# Patient Record
Sex: Female | Born: 1965 | Race: White | Hispanic: No | Smoking: Never smoker
Health system: Southern US, Community
[De-identification: ages and names within clinical notes are randomized; demographics above are authoritative.]

## PROBLEM LIST (undated history)

## (undated) DIAGNOSIS — M1990 Unspecified osteoarthritis, unspecified site: Secondary | ICD-10-CM

## (undated) DIAGNOSIS — Z87442 Personal history of urinary calculi: Secondary | ICD-10-CM

## (undated) DIAGNOSIS — K589 Irritable bowel syndrome without diarrhea: Secondary | ICD-10-CM

## (undated) DIAGNOSIS — I8290 Acute embolism and thrombosis of unspecified vein: Secondary | ICD-10-CM

## (undated) DIAGNOSIS — F419 Anxiety disorder, unspecified: Secondary | ICD-10-CM

## (undated) DIAGNOSIS — K219 Gastro-esophageal reflux disease without esophagitis: Secondary | ICD-10-CM

## (undated) DIAGNOSIS — H409 Unspecified glaucoma: Secondary | ICD-10-CM

## (undated) DIAGNOSIS — R112 Nausea with vomiting, unspecified: Secondary | ICD-10-CM

## (undated) DIAGNOSIS — I10 Essential (primary) hypertension: Secondary | ICD-10-CM

## (undated) DIAGNOSIS — H3320 Serous retinal detachment, unspecified eye: Secondary | ICD-10-CM

## (undated) DIAGNOSIS — Z9889 Other specified postprocedural states: Secondary | ICD-10-CM

## (undated) DIAGNOSIS — I2699 Other pulmonary embolism without acute cor pulmonale: Secondary | ICD-10-CM

## (undated) DIAGNOSIS — T7840XA Allergy, unspecified, initial encounter: Secondary | ICD-10-CM

## (undated) DIAGNOSIS — I82409 Acute embolism and thrombosis of unspecified deep veins of unspecified lower extremity: Secondary | ICD-10-CM

## (undated) DIAGNOSIS — D649 Anemia, unspecified: Secondary | ICD-10-CM

## (undated) DIAGNOSIS — K602 Anal fissure, unspecified: Secondary | ICD-10-CM

## (undated) DIAGNOSIS — H7291 Unspecified perforation of tympanic membrane, right ear: Secondary | ICD-10-CM

## (undated) DIAGNOSIS — Z8489 Family history of other specified conditions: Secondary | ICD-10-CM

## (undated) DIAGNOSIS — R011 Cardiac murmur, unspecified: Secondary | ICD-10-CM

## (undated) DIAGNOSIS — Z8719 Personal history of other diseases of the digestive system: Secondary | ICD-10-CM

## (undated) HISTORY — DX: Anal fissure, unspecified: K60.2

## (undated) HISTORY — PX: INCONTINENCE SURGERY: SHX676

## (undated) HISTORY — DX: Essential (primary) hypertension: I10

## (undated) HISTORY — DX: Gastro-esophageal reflux disease without esophagitis: K21.9

## (undated) HISTORY — DX: Allergy, unspecified, initial encounter: T78.40XA

## (undated) HISTORY — DX: Personal history of other diseases of the digestive system: Z87.19

## (undated) HISTORY — PX: ENDOMETRIAL ABLATION: SHX621

## (undated) HISTORY — PX: ABDOMINAL HYSTERECTOMY: SHX81

## (undated) HISTORY — DX: Other pulmonary embolism without acute cor pulmonale: I26.99

## (undated) HISTORY — DX: Cardiac murmur, unspecified: R01.1

## (undated) HISTORY — DX: Unspecified osteoarthritis, unspecified site: M19.90

## (undated) HISTORY — DX: Acute embolism and thrombosis of unspecified vein: I82.90

## (undated) HISTORY — PX: EYE SURGERY: SHX253

## (undated) HISTORY — PX: TUBAL LIGATION: SHX77

## (undated) HISTORY — PX: COLONOSCOPY: SHX174

## (undated) HISTORY — PX: RETINAL DETACHMENT SURGERY: SHX105

## (undated) HISTORY — PX: KNEE SURGERY: SHX244

## (undated) HISTORY — DX: Anxiety disorder, unspecified: F41.9

## (undated) HISTORY — PX: RECTAL PROLAPSE REPAIR: SHX759

## (undated) HISTORY — DX: Serous retinal detachment, unspecified eye: H33.20

## (undated) HISTORY — PX: INNER EAR SURGERY: SHX679

## (undated) HISTORY — DX: Acute embolism and thrombosis of unspecified deep veins of unspecified lower extremity: I82.409

---

## 1998-06-19 HISTORY — PX: RETINAL DETACHMENT SURGERY: SHX105

## 1999-01-10 ENCOUNTER — Other Ambulatory Visit: Admission: RE | Admit: 1999-01-10 | Discharge: 1999-01-10 | Payer: Self-pay | Admitting: Obstetrics and Gynecology

## 1999-01-21 ENCOUNTER — Observation Stay (HOSPITAL_COMMUNITY): Admission: AD | Admit: 1999-01-21 | Discharge: 1999-01-22 | Payer: Self-pay | Admitting: Ophthalmology

## 1999-03-02 ENCOUNTER — Ambulatory Visit (HOSPITAL_COMMUNITY): Admission: RE | Admit: 1999-03-02 | Discharge: 1999-03-03 | Payer: Self-pay | Admitting: Ophthalmology

## 2000-05-22 ENCOUNTER — Encounter (INDEPENDENT_AMBULATORY_CARE_PROVIDER_SITE_OTHER): Payer: Self-pay | Admitting: Specialist

## 2000-05-22 ENCOUNTER — Other Ambulatory Visit: Admission: RE | Admit: 2000-05-22 | Discharge: 2000-05-22 | Payer: Self-pay | Admitting: Obstetrics and Gynecology

## 2000-08-17 ENCOUNTER — Other Ambulatory Visit: Admission: RE | Admit: 2000-08-17 | Discharge: 2000-08-17 | Payer: Self-pay | Admitting: Obstetrics and Gynecology

## 2000-09-10 ENCOUNTER — Encounter (INDEPENDENT_AMBULATORY_CARE_PROVIDER_SITE_OTHER): Payer: Self-pay | Admitting: Specialist

## 2000-09-10 ENCOUNTER — Ambulatory Visit (HOSPITAL_COMMUNITY): Admission: RE | Admit: 2000-09-10 | Discharge: 2000-09-10 | Payer: Self-pay | Admitting: Obstetrics and Gynecology

## 2001-10-23 ENCOUNTER — Other Ambulatory Visit: Admission: RE | Admit: 2001-10-23 | Discharge: 2001-10-23 | Payer: Self-pay | Admitting: Obstetrics and Gynecology

## 2002-12-26 ENCOUNTER — Other Ambulatory Visit: Admission: RE | Admit: 2002-12-26 | Discharge: 2002-12-26 | Payer: Self-pay | Admitting: Obstetrics and Gynecology

## 2003-09-18 ENCOUNTER — Inpatient Hospital Stay (HOSPITAL_COMMUNITY): Admission: AD | Admit: 2003-09-18 | Discharge: 2003-09-18 | Payer: Self-pay | Admitting: Obstetrics & Gynecology

## 2004-05-02 ENCOUNTER — Other Ambulatory Visit: Admission: RE | Admit: 2004-05-02 | Discharge: 2004-05-02 | Payer: Self-pay | Admitting: Obstetrics and Gynecology

## 2004-05-05 ENCOUNTER — Encounter: Admission: RE | Admit: 2004-05-05 | Discharge: 2004-05-05 | Payer: Self-pay | Admitting: Obstetrics and Gynecology

## 2004-05-25 ENCOUNTER — Encounter: Admission: RE | Admit: 2004-05-25 | Discharge: 2004-05-25 | Payer: Self-pay | Admitting: Obstetrics and Gynecology

## 2004-10-06 ENCOUNTER — Observation Stay (HOSPITAL_COMMUNITY): Admission: RE | Admit: 2004-10-06 | Discharge: 2004-10-07 | Payer: Self-pay | Admitting: Obstetrics and Gynecology

## 2004-10-11 ENCOUNTER — Inpatient Hospital Stay (HOSPITAL_COMMUNITY): Admission: AD | Admit: 2004-10-11 | Discharge: 2004-10-11 | Payer: Self-pay | Admitting: Obstetrics and Gynecology

## 2009-06-19 HISTORY — PX: HEMORRHOID SURGERY: SHX153

## 2009-10-05 ENCOUNTER — Ambulatory Visit (HOSPITAL_COMMUNITY): Admission: RE | Admit: 2009-10-05 | Discharge: 2009-10-05 | Payer: Self-pay | Admitting: General Surgery

## 2009-12-07 ENCOUNTER — Encounter: Admission: RE | Admit: 2009-12-07 | Discharge: 2009-12-07 | Payer: Self-pay | Admitting: Family Medicine

## 2010-02-09 ENCOUNTER — Encounter: Admission: RE | Admit: 2010-02-09 | Discharge: 2010-02-09 | Payer: Self-pay | Admitting: Family Medicine

## 2010-04-19 HISTORY — PX: VEIN SURGERY: SHX48

## 2010-08-18 ENCOUNTER — Other Ambulatory Visit: Payer: Self-pay | Admitting: Obstetrics and Gynecology

## 2010-08-18 DIAGNOSIS — R928 Other abnormal and inconclusive findings on diagnostic imaging of breast: Secondary | ICD-10-CM

## 2010-08-24 ENCOUNTER — Ambulatory Visit
Admission: RE | Admit: 2010-08-24 | Discharge: 2010-08-24 | Disposition: A | Discharge status: Home / Self Care | Payer: PRIVATE HEALTH INSURANCE | Source: Ambulatory Visit | Attending: Obstetrics and Gynecology | Admitting: Obstetrics and Gynecology

## 2010-08-24 DIAGNOSIS — R928 Other abnormal and inconclusive findings on diagnostic imaging of breast: Secondary | ICD-10-CM

## 2010-09-06 LAB — PREGNANCY, URINE: Preg Test, Ur: NEGATIVE

## 2010-09-06 LAB — DIFFERENTIAL
Basophils Absolute: 0 10*3/uL (ref 0.0–0.1)
Basophils Relative: 1 % (ref 0–1)
Eosinophils Absolute: 0.1 10*3/uL (ref 0.0–0.7)
Eosinophils Relative: 1 % (ref 0–5)
Lymphocytes Relative: 19 % (ref 12–46)
Lymphs Abs: 1.3 10*3/uL (ref 0.7–4.0)
Monocytes Absolute: 0.5 10*3/uL (ref 0.1–1.0)
Monocytes Relative: 8 % (ref 3–12)
Neutro Abs: 4.7 10*3/uL (ref 1.7–7.7)
Neutrophils Relative %: 71 % (ref 43–77)

## 2010-09-06 LAB — CBC
HCT: 40.8 % (ref 36.0–46.0)
Hemoglobin: 14.2 g/dL (ref 12.0–15.0)
MCHC: 34.7 g/dL (ref 30.0–36.0)
MCV: 89.8 fL (ref 78.0–100.0)
Platelets: 272 10*3/uL (ref 150–400)
RBC: 4.54 MIL/uL (ref 3.87–5.11)
RDW: 12.8 % (ref 11.5–15.5)
WBC: 6.6 10*3/uL (ref 4.0–10.5)

## 2010-10-10 ENCOUNTER — Encounter: Payer: Self-pay | Admitting: Internal Medicine

## 2010-10-11 ENCOUNTER — Encounter: Payer: Self-pay | Admitting: *Deleted

## 2010-10-11 ENCOUNTER — Encounter: Payer: Self-pay | Admitting: Internal Medicine

## 2010-10-11 ENCOUNTER — Ambulatory Visit (INDEPENDENT_AMBULATORY_CARE_PROVIDER_SITE_OTHER): Payer: PRIVATE HEALTH INSURANCE | Admitting: Internal Medicine

## 2010-10-11 DIAGNOSIS — R0609 Other forms of dyspnea: Secondary | ICD-10-CM

## 2010-10-11 DIAGNOSIS — I10 Essential (primary) hypertension: Secondary | ICD-10-CM

## 2010-10-11 DIAGNOSIS — R0600 Dyspnea, unspecified: Secondary | ICD-10-CM

## 2010-10-11 MED ORDER — NEBIVOLOL HCL 5 MG PO TABS: 5.0000 mg | ORAL_TABLET | Freq: Every day | ORAL | Status: DC

## 2010-10-11 MED ORDER — DEXLANSOPRAZOLE 60 MG PO CPDR: 60.0000 mg | DELAYED_RELEASE_CAPSULE | Freq: Every day | ORAL | Status: AC

## 2010-10-11 NOTE — Progress Notes (Signed)
  Subjective:    Patient ID: Sharon [name], female    DOB: 1965/11/16, 45 y.o.   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   10/11/2010 Initial pulmonary office eval on ACEI ccc  new onset for 2 years constant sob esp with talking assoc with chest tightness and a dry cough.  Rarely happening at night. Assoc with overt HB.  Neg w/u with chest ct, spirometry 4/17 classic vcd pattern.    Sob is the same at rest vs walking.  Mild nasal congestion chronically also but no purulent secretions, no response to anything for nose including inhaled steroids so stopped fu with Dr Ezzard Standing.  Nothing also ever helps breathing in terms of meds but wearing a bra or speaking makes it much worse  Pt denies any significant sore throat, dysphagia, itching, sneezing,  nasal congestion or excess/ purulent secretions,  fever, chills, sweats, unintended wt loss, pleuritic or exertional cp, hempoptysis, orthopnea pnd or leg swelling.    Also denies any obvious fluctuation of symptoms with weather or environmental changes or other aggravating or alleviating factors other than above.  PMHx CR  F/u [place]     Review of Systems  Constitutional: Negative for fever, chills and unexpected weight change.  HENT: Positive for congestion. Negative for ear pain, nosebleeds, sore throat, rhinorrhea, sneezing, trouble swallowing, dental problem, voice change, postnasal drip and sinus pressure.   Eyes: Negative for visual disturbance.  Respiratory: Positive for cough and shortness of breath. Negative for choking.   Cardiovascular: Negative for chest pain and leg swelling.  Gastrointestinal: Negative for vomiting, abdominal pain and diarrhea.  Genitourinary: Negative for difficulty urinating.  Musculoskeletal: Positive for arthralgias.  Skin: Negative for rash.    Neurological: Negative for tremors, syncope and headaches.  Hematological: Does not bruise/bleed easily.       Objective:   Physical Exam Wt 172  10/11/10    anxious wf with classic voice fatigue and classic pseudowheeze .    HEENT: nl dentition, turbinates, and orophanx. Nl external ear canals without cough reflex   NECK :  without JVD/Nodes/TM/ nl carotid upstrokes bilaterally   LUNGS: no acc muscle use, clear to A and P bilaterally without cough on insp or exp maneuvers   CV:  RRR  no s3 or murmur or increase in P2, no edema   ABD:  soft and nontender with nl excursion in the supine position. No bruits or organomegaly, bowel sounds nl  MS:  warm without deformities, calf tenderness, cyanosis or clubbing  SKIN: warm and dry without lesions    NEURO:  alert, approp, no deficits      Assessment & Plan:

## 2010-10-11 NOTE — Patient Instructions (Addendum)
Stop lisinopril Start bystolic 5 mg one daily Start dexilant 60 mg Take 30-60 min before first meal of the day and Zantac 300mg  one at bedtime   GERD (REFLUX)  is an extremely common cause of respiratory symptoms like yours,  many times with no significant heartburn at all.    It can be treated with medication, but also with lifestyle changes including avoidance of late meals, excessive alcohol, smoking cessation, and avoid fatty foods, chocolate, peppermint, colas, red wine, and acidic juices such as orange juice.  NO MINT OR MENTHOL PRODUCTS SO NO COUGH DROPS  USE SUGARLESS CANDY INSTEAD (jolley ranchers or Stover's)  NO OIL BASED VITAMINS    Please schedule a follow up office visit in 2 weeks, sooner if needed

## 2010-10-13 DIAGNOSIS — I10 Essential (primary) hypertension: Secondary | ICD-10-CM | POA: Insufficient documentation

## 2010-10-13 DIAGNOSIS — R0600 Dyspnea, unspecified: Secondary | ICD-10-CM | POA: Insufficient documentation

## 2010-10-13 NOTE — Assessment & Plan Note (Addendum)
The differential diagnosis of difficult to control airways disorders is extensive with no quick and easy answers but easy to remember because it consists of 11 A's,  Two Bs and one C: 1. Adherence, always a challenge and the leading suspect.   2. Acid reflux disease, with the greater proportion of pulmonary patients with no overt heartburn symptoms, and no easy way to treat non-acid reflux  > start empiric high dose rx/ diet   3. Ace inhibitor use, the side effects of which give  even experienced pulmonologists a challenge sorting out (it's not all about the dry cough, though if cough is present ace's will need to be stopped for at least a month to tease out this component)  4. Active sinus dz, best addressed by a sinus ct >  May need to return to Dr Narda Bonds to also look at cords   5. Active smoking,  Usually sureptitious in this setting >  >not relevant here   6. Allergic diseases, usually with a hx dating back to childhood with prominent allergic rhinitis features in up 90% of pts   7. Aspiration, a perennial problem in the elderly or other patients at risk> >not relevant here   8. Allergic Bronchopulmonary Aspergillosis, associated with IgE's in the thousands > >not relevant here  9. Alpha one Antitrypsin deficiency, a must screen in patients with chronic airflow obstruction syndromes out of proportion to smoking history  > >not relevant here   10. Adverse effect of inhalers, especially DPI's and especially with poor inhaler technique > >not relevant here   11 Anxiety, always a diagnosis of exclusion and probably a component  12. Actual health care provider (works in [place]) Two B's 1. Bronchiectasis:  Pos CT is the sine que non here 2  Beta blocker effects:  Coreg and Timolol use are pervasive in the adult population and both have significant spillover effects on the airways One C 1. Congestive heart failure, nicely ruled out now with BNP level of < 100 when symptomatic   See  instructions for specific recommendations which were reviewed directly with the patient who was given a copy with highlighter outlining the key components.

## 2010-10-13 NOTE — Assessment & Plan Note (Signed)

## 2010-10-18 ENCOUNTER — Telehealth: Payer: Self-pay | Admitting: *Deleted

## 2010-10-18 NOTE — Telephone Encounter (Signed)
done

## 2010-10-18 NOTE — Telephone Encounter (Signed)
PA form received and placed in MW look at for signature and review.

## 2010-10-18 NOTE — Telephone Encounter (Signed)
PA for Dexilant initiated through [facility] at [phone]. Member # [id number]. Awaiting fax.

## 2010-10-20 NOTE — Telephone Encounter (Signed)
Dexilant has been APPROVED from 10/20/2010 to 10/19/2012. Pt and [facility] in [place] notified.

## 2010-10-20 NOTE — Telephone Encounter (Signed)
PA was faxed back to ins co this am

## 2010-10-25 ENCOUNTER — Ambulatory Visit (INDEPENDENT_AMBULATORY_CARE_PROVIDER_SITE_OTHER)
Admission: RE | Admit: 2010-10-25 | Discharge: 2010-10-25 | Disposition: A | Discharge status: Home / Self Care | Payer: PRIVATE HEALTH INSURANCE | Source: Ambulatory Visit | Attending: Cardiology | Admitting: Cardiology

## 2010-10-25 ENCOUNTER — Encounter: Payer: Self-pay | Admitting: Internal Medicine

## 2010-10-25 ENCOUNTER — Ambulatory Visit (INDEPENDENT_AMBULATORY_CARE_PROVIDER_SITE_OTHER): Payer: PRIVATE HEALTH INSURANCE | Admitting: Internal Medicine

## 2010-10-25 VITALS — BP 122/74 | HR 70 | Temp 98.5°F | Ht 68.0 in | Wt 170.8 lb

## 2010-10-25 DIAGNOSIS — R051 Acute cough: Secondary | ICD-10-CM | POA: Insufficient documentation

## 2010-10-25 DIAGNOSIS — R05 Cough: Secondary | ICD-10-CM | POA: Insufficient documentation

## 2010-10-25 DIAGNOSIS — I10 Essential (primary) hypertension: Secondary | ICD-10-CM

## 2010-10-25 DIAGNOSIS — R059 Cough, unspecified: Secondary | ICD-10-CM

## 2010-10-25 MED ORDER — PREDNISONE (PAK) 10 MG PO TABS: ORAL_TABLET | ORAL | Status: AC

## 2010-10-25 MED ORDER — TRAMADOL HCL 50 MG PO TABS: ORAL_TABLET | ORAL | Status: DC

## 2010-10-25 NOTE — Progress Notes (Signed)
  Subjective:    Patient ID: Sharon [name], female    DOB: 06-19-1966, 45 y.o.   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   10/11/2010 Initial pulmonary office eval on ACEI ccc  new onset for 2 years constant sob esp with talking assoc with chest tightness and a dry cough.  Rarely happening at night. Assoc with overt HB.  Neg w/u with chest ct, spirometry 4/17 classic vcd pattern.    Sob is the same at rest vs walking.  Mild nasal congestion chronically also but no purulent secretions, no response to anything for nose including inhaled steroids so stopped fu with Dr Ezzard Standing.  Nothing also ever helps breathing in terms of meds but wearing a bra or speaking makes it much worse.  rec Stop lisinopril Start bystolic 5 mg one daily Start dexilant 60 mg Take 30-60 min before first meal of the day and Zantac 300mg  one at bedtime GERD (REFLUX) diet  10/25/2010 ov/ Dilon Lank  Cc no longer with noct respiratory events,  Trying to weaning off afrin x 4 days stopped and has noticed increase throat scratchiness since then.  Taking one half at clonazepam at bedtime and not sleeping well. Pt denies any significant sore throat, dysphagia, itching, sneezing,  nasal congestion or excess/ purulent secretions,  fever, chills, sweats, unintended wt loss, pleuritic or exertional cp, hempoptysis, orthopnea pnd or leg swelling.    Also denies any obvious fluctuation of symptoms with weather or environmental changes or other aggravating or alleviating factors.     PMHx CR  F/u [place]  HBP  Off ACEI 10/11/10 due to pseudowheeze   Review of Systems     Objective:   Physical Exam    Wt 172 10/11/10  > 170 10/26/2010  anxious wf with classic voice fatigue and classic pseudowheeze not as severe .  HEENT: nl dentition, turbinates, and orophanx. Nl external ear  canals without cough reflex  NECK : without JVD/Nodes/TM/ nl carotid upstrokes bilaterally  LUNGS: no acc muscle use, clear to A and P bilaterally without cough on insp or exp maneuvers  CV: RRR no s3 or murmur or increase in P2, no edema  ABD: soft and nontender with nl excursion in the supine position. No bruits or organomegaly, bowel sounds nl  MS: warm without deformities, calf tenderness, cyanosis or clubbing  SKIN: warm and dry without lesions      Assessment & Plan:

## 2010-10-25 NOTE — Patient Instructions (Addendum)
Continue  bystolic 5 mg one daily Continue  dexilant 60 mg Take 30-60 min before first meal of the day and Zantac 300mg  one at bedtime Add Chlortrimeton 4mg  one bedtime and during the day as needed for sensation of drainage.   GERD (REFLUX)  is an extremely common cause of respiratory symptoms like yours,  many times with no significant heartburn at all.    It can be treated with medication, but also with lifestyle changes including avoidance of late meals, excessive alcohol, smoking cessation, and avoid fatty foods, chocolate, peppermint, colas, red wine, and acidic juices such as orange juice.  NO MINT OR MENTHOL PRODUCTS SO NO COUGH DROPS  USE SUGARLESS CANDY INSTEAD (jolley ranchers or Stover's)  NO OIL BASED VITAMINS    Take delsym two tsp every 12 hours and supplement if needed with  tramadol 50 mg up to 2 every 4 hours to suppress the urge to cough. Swallowing water or using ice chips/non mint and menthol containing candies (such as lifesavers or sugarless jolly ranchers) are also effective.  You should rest your voice and avoid activities that you know make you cough.  Once you have eliminated the cough for 3 straight days try reducing the tramadol first,  then the delsym as tolerated.    Prednisone 10 mg take  4 each am x 2 days,   2 each am x 2 days,  1 each am x2days and stop .  Please see patient coordinator before you leave today  to schedule sinus CT   Please schedule a follow up office visit in 2 weeks, sooner if needed

## 2010-10-25 NOTE — Progress Notes (Signed)
Quick Note:  Spoke with pt and notified of results per Dr. Wert. Pt verbalized understanding and denied any questions.  ______ 

## 2010-10-26 NOTE — Assessment & Plan Note (Signed)
This has all the features of  Classic Upper airway cough syndrome, so named because it's frequently impossible to sort out how much is  CR/sinusitis with freq throat clearing (which can be related to primary GERD)   vs  causing  secondary (" extra esophageal")  GERD from wide swings in gastric pressure that occur with throat clearing, often  promoting self use of mint and menthol lozenges that reduce the lower esophageal sphincter tone and exacerbate the problem further in a cyclical fashion.   These are the same pts who not infrequently have failed to tolerate ace inhibitors,  dry powder inhalers or biphosphonates or report having reflux symptoms that don't respond to standard doses of PPI , and are easily confused as having aecopd or asthma flares,  rec continue off ace, on max gerd and rx cyclical cough  See instructions for specific recommendations which were reviewed directly with the patient who was given a copy with highlighter outlining the key components.

## 2010-10-26 NOTE — Assessment & Plan Note (Addendum)
Ok off ace : Strongly prefer in this setting: Bystolic, the most beta -1  selective Beta blocker available in sample form, with bisoprolol the most selective generic choice  on the market.

## 2010-10-28 ENCOUNTER — Encounter: Payer: Self-pay | Admitting: Internal Medicine

## 2010-11-01 ENCOUNTER — Telehealth: Payer: Self-pay | Admitting: Internal Medicine

## 2010-11-01 NOTE — Telephone Encounter (Signed)
Pt c/o "lump in throat" after she takes Delsym, hard for her to swallow approx 7-9 hours. States breathing and cough is better. Leaving for the beach tonight and wants to know if there is an alternative medication. Dr. Sherene Sires please advise. Thanks.

## 2010-11-01 NOTE — Telephone Encounter (Signed)
I rec tramadol to back up the delsym so it's ok to use tramadol in a pinch but could also try robitissuin (not rob dm) as needed, very mild but a reasonable otc choice.

## 2010-11-01 NOTE — Telephone Encounter (Signed)
Pt aware of recs. Rosselyn Martha, CMA  

## 2010-11-03 ENCOUNTER — Encounter: Payer: Self-pay | Admitting: Adult Health

## 2010-11-04 NOTE — Op Note (Signed)
[facility]  Patient:    Sharon [name]                     MRN: [id number] Proc. Date: 09/10/00 Adm. Date:  [id number] Attending:  Cordelia Pen Ii                           Operative Report  PREOPERATIVE DIAGNOSIS:       Pelvic pain.  Nevus of the left breast.  POSTOPERATIVE DIAGNOSIS:      Endometriosis.  Pelvic adhesions.  Nevus of the left breast.  OPERATION:                    Laparoscopy with resection and ablation of endometriosis, lysis of adhesions, and excision of mole from the left breast.  SURGEON:                      Guy Sandifer. Arleta Creek, M.D.  ASSISTANT:  ANESTHESIA:                   General with endotracheal intubation by Belva Agee, M.D.  ESTIMATED BLOOD LOSS:         Drops.  INDICATIONS:                  This patient is a 45 year old married white female, gravida 6, para 1, abortus 4, who has right lower quadrant pain and right lower back pain.  Details are dictated in the history and physical. Laparoscopy is discussed with the patient.  The patient also has a nevus on the left breast she requests removal of.  Possible risks and complications have been discussed including, but not limited to infection, scarring, bowel, bladder, or ureteral damage, bleeding requiring transfusion of blood products with possible transfusion reaction, HIV, and hepatitis acquisition, DVT, PE, pneumonia.  All questions were answered and consent is signed on the chart.  FINDINGS:                     The cecum is adherent to the right pelvic brim with multiple adhesions.  The appendix is normal.  The uterus is normal in size and contour.  Anterior cul-de-sac is normal.  Tubes and ovaries are normal bilaterally.  Left pelvic side wall normal.  Right pelvic side wall contains several petechial-type implants of endometriosis.  Posterior cul-de-sac contains an 8 mm dark black implant of endometriosis in the midline. There is a  pseudowindow in the upper midline cul-de-sac with red/brown endometriotic lesions.  DESCRIPTION OF PROCEDURE:     The patient is taken to the operating room and placed in the dorsal supine position where general anesthesia is induced via endotracheal intubation.  She is then prepped abdominally and vaginally.  The bladder is straight catheterized.  Hulka tenaculum is placed in the uterus as a manipulator and she is draped as a sterile fashion.  A small infraumbilical incision is made and a 10 mm disposable trocar sleeve was placed on the first attempt without difficulty.  Placement is verified with the laparoscope and no damage to the surrounding structures is noted.  The pneumoperitoneum is induced. A small suprapubic incision is made and a 5 mm nondisposable trocar sleeve is placed under direct visualization without difficulty.  The above findings are noted.  The dark black implant in the posterior cul-de-sac is resected in a simple fashion without difficulty.  Hemostasis is maintained with bipolar cautery at the peritoneal margins of the incision.  The implants in the pseudowindow are ablated with bipolar cautery.  A similar procedure is carried out on the right pelvic side wall.  This is well clear of the course of the ureter.  The adhesions to the right pelvic brim are taken down sharply and hemostasis is obtained with bipolar cautery.  Copious irrigation is carried out and all returns as clear.  Interceed is backloaded through the laparoscope and placed over the posterior cul-de-sac, right pelvic side wall, and the cecum.  Excess fluid is removed.  Suprapubic trocar sleeve is removed and the peritoneum is reduced and no bleeding is noted.  The umbilical trocar sleeve is removed and the skin incisions are closed with subcuticular 3-0 Vicryl suture.  The incisions are injected with 0.5% plain Marcaine.  The 1 cm nevus on a very narrow base in the 3 oclock position 3 cm lateral to the  left areola of the left breast is draped and prepped with Betadine.  Simple resection is used to remove the nevus.  Pressure is applied and then Dermabond is applied.  This is also injected with 0.5% plain Marcaine.  Hulka tenaculum is removed and no bleeding is noted.  All counts are correct and the patient is awakened, taken to the recovery room in stable condition. DD:  09/10/00 TD:  09/10/00 Job: [id number]

## 2010-11-04 NOTE — Op Note (Signed)
NAMEJulienne [name]              ACCOUNT NO.:  [id number]   MEDICAL RECORD NO.:  [id number]          PATIENT TYPE:  AMB   LOCATION:  [facility]                           FACILITY:  [facility]   PHYSICIAN:  Guy Sandifer. Tomblin II, M.D.DATE OF BIRTH:  [birth year]   DATE OF PROCEDURE:  10/06/2004  DATE OF DISCHARGE:                                 OPERATIVE REPORT   PREOPERATIVE DIAGNOSES:  1.  Stress urinary incontinence.  2.  Desires permanent sterilization.  3.  Dysmenorrhea.   POSTOPERATIVE DIAGNOSES:  1.  Stress urinary incontinence.  2.  Endometriosis.  3.  Abdominopelvic adhesions.   PROCEDURES:  1.  Mid urethral sling with Uretex.  2.  Laparoscopy with bilateral tubal ligation with Filshie clips.  3.  Ablation of endometriosis.  4.  Lysis of adhesions.   SURGEON:  Harold Hedge, M.D.   ANESTHESIA:  General with endotracheal intubation.   ESTIMATED BLOOD LOSS:  50 mL.   INDICATIONS AND CONSENT:  This patient is a 45 year old, separated white  female, G6, P1, with increasing symptoms of stress urinary incontinence,  dysmenorrhea, dyspareunia, who also desires permanent sterilization.  Mid  urethral sling with Uretex, laparoscopy, tubal ligation with Filshie clips  has been discussed with the patient.  Alternate methods of contraception  have been discussed.  Potential risks and complications have been discussed  preoperatively, including but not limited to infection; bowel, bladder,  ureteral damage; bleeding requiring transfusion of blood products with  possible transfusion reaction, HIV, hepatitis acquisition; DVT, PE,  pneumonia; urinary retention; inability to void; possibility of self-  catheterization; possibility of persistent or recurrent stress incontinence;  vaginal mucosa erosion and dyspareunia and possibility of having the sling  taken back down later.  All questions are answered, and consent is signed  and on the chart.   FINDINGS:  Upper abdomen is grossly normal.   The appendix is free, but the  proximal large bowel is adherent to the abdominal sidewall.  In the pelvis,  the uterus is normal in size and contour.  Tubes and ovaries are normal  bilaterally.  Right pelvic sidewall contains some red, smudgy implants of  endometriosis, and the posterior cul-de-sac contains some pseudowindows with  several small implants of both powder-burn and white, pucker-type  endometrial implants.   DESCRIPTION OF PROCEDURE:  The patient is taken to the operating room where  she is identified, placed in dorsal supine position, and general anesthesia  is induced via endotracheal intubation.  She does have PAS stockings in  place.  She does receive preoperative antibiotics.  She is then placed in  the dorsolithotomy position where she is prepped abdominally and vaginally,  and she is draped in a sterile fashion.  Weighted speculum is placed.  The  suburethral vaginal mucosa is then injected with 0.5% Marcaine with  1:200,000 epinephrine.  Injection is carried out widely to the inferior  margin of the pelvic brim as well.  A Foley catheter is placed in the  bladder to drain it and to mark the portion of the urethra.  The suburethral  vaginal mucosa is then taken  down in the midline and dissected bilaterally  to the point of the pelvic brim.  Suprapubically the midline is marked and  then injected, and two incisions are made, 1-1.5 fingerbreadths lateral to  the midline.  Then with the Foley catheter in place and the bladder  decompressed, the Bard Uretex needle is then placed per instruction, exiting  lateral to the urethra on the patient's left.  Cystoscopy is then carried  out, and 180-degree inspection reveals the bladder to be intact.  The  introducer is then pulled on through, leaving the blue tubing in place.  Similar procedure is carried out on the right side and, again, cystoscopy is  performed.  A 180-degree inspection reveals no evidence of bladder trauma or   perforation.  The ureteral orifices are identified bilaterally, and a puff  of urine is noted bilaterally.  No foreign bodies are noted.  The Uretex  sling is then placed using the blue tubing.  Tension is adjusted so there is  a small gap between the sling, and the urethra and a Kelly clamp placed  under the sling can easily be rotated 90 degrees to the floor without  tension.  The excess Uretex is then trimmed at the level of the skin.  The  vaginal mucosa is closed with a running locking 2-0 Vicryl suture.  A Foley  catheter is in place and draining to a bag.  Next, attention is turned to  the abdomen.  A small infraumbilical incision is made, and the Veress needle  is placed with a normal syringe drop test.  Two liters of gas are then  insufflated with low pressure with good tympany in the right upper quadrant.  Veress needle is removed and replaced with a 10-11 disposable trocar sleeve.  Placement is verified with the laparoscope, and no damage to surrounding  structures is noted.  Small suprapubic incision is made, and the 5 mm trocar  sleeve is placed under direct visualization without difficulty.  The above  findings are noted.  Cautery is used to obtain hemostasis at the point of  insertion of the adhesions to the right abdominal sidewall.  These adhesions  on the large bowel are then taken down well clear of the bowel.  Irrigation  is carried out, and inspection under reduced peritoneum reveals good  hemostasis.  The areas of endometriosis in the pelvis are ablated with  bipolar cautery, staying well clear of the course of the right ureter.  The  right fallopian tube is then identified from cornu to fimbria, and a Filshie  clip is placed on the proximal 1/3 of the tube.  A similar procedure is  carried out on the left tube.  After the Filshie clip applicator is removed,  careful inspection reveals the full width of the tube to be within the clip and the heel of the clip to be  visible through the mesosalpinx bilaterally.  Inspection again under reduced pneumoperitoneum reveals continued  hemostasis.  Pneumoperitoneum is completely reduced, and the trocar sleeves  are removed.  Then 2-0 Vicryl in subcuticular fashion is used to close the  umbilical incision.  Both incisions are injected with 0.5% plain Marcaine.  All 4 incisions are then closed at the skin with Dermabond.  All counts are  correct.  The patient is awakened and taken to the recovery room in stable  condition.      JET/MEDQ  D:  10/06/2004  T:  10/06/2004  Job:  [id number]

## 2010-11-04 NOTE — H&P (Signed)
[facility]  Patient:    Sharon [name]                       MRN: [id number] Adm. Date:  09/10/00 Attending:  Guy Sandifer. Ar[place]ta [place], M.D.                         History and Physical  CHIEF COMPLAINT:              Pelvic pain.  HISTORY OF PRESENT ILLNESS:   This patient is a 45 year old married white female, G6, P5, who has right lower quadrant pain and right lower back pain premenstrually each month.  The pain gets severe before each menses; however, she does have some dull right lower quadrant pain all month long.  After a careful discussion of the options, she is being admitted for a laparoscopy.  PAST MEDICAL HISTORY:         1. Irritable bowel syndrome.                               2. Vulvar herpes.                               3. Phlebitis in the left leg postpartum in 1991.                               4. Bilateral detached retinas.  PAST SURGICAL HISTORY:        1. Repair of bilateral retinal detachment.                               2. Ear drum reconstruction.                               3. Lump removed from leg at age 66.                               4. Hemorrhoidectomy x 2.  OBSTETRICAL HISTORY:          Termination x 1.  Miscarriage x 4.  Full-term delivery x 1 in 1990.  FAMILY HISTORY:               Diabetes in father and maternal grandmother, chronic hypertension in father, heart disease in father, emphysema in mother.  MEDICATIONS:                  1. Vicodin p.r.n.                               2. Acyclovir 400 mg 1 p.o. q.d.  ALLERGIES:                    PENICILLIN with unknown type of reaction.  SOCIAL HISTORY:               The patient denies tobacco, alcohol, or drug abuse.  REVIEW OF SYSTEMS:            Negative except as above.  PHYSICAL EXAMINATION:  VITAL SIGNS:  Height 5 feet 8-1/2 inches.  Weight 161 pounds. Blood pressure 120/78.  NECK:                         Without  thyromegaly.  LUNGS:                        Clear to auscultation.  HEART:                        Regular rate and rhythm.  BACK:                         Without CVA tenderness.  BREASTS:                      Without mass, retraction, or discharge.  ABDOMEN:                      Soft, nontender without masses.  PELVIC:                       Vulva, vagina, and cervix without lesion. Uterus anteverted, mobile, nontender.  Adnexa mildly tender on the right without masses.  Left adnexa mildly tender without masses.  EXTREMITIES:                  Grossly within normal limits.  NEUROLOGIC:                   Grossly within normal limits.  LABORATORY DATA:              Capillary hemoglobin 13.7.  Prothrombin mutation is negative.  Factor V Leiden mutation is negative.  Lupus anticoagulant negative.  Cardiolipin antibody IgM is inconclusive.  Cardiolipin antibody IgG is negative.  ANA is negative.  PT/PTT normal.  Ultrasound on September 04, 2000, reveals uterus measuring 7.7 x 4.26 x 5.59 cm. Ovaries are normal.  Sonohistogram is negative for endometrial masses.  ASSESSMENT:                   Pelvic pain and dysmenorrhea.  PLAN:                         Laparoscopy. DD:  09/07/00 TD:  09/07/00 Job: [id number]

## 2010-11-04 NOTE — H&P (Signed)
NAMEJulienne [name]              ACCOUNT NO.:  [id number]   MEDICAL RECORD NO.:  [id number]          PATIENT TYPE:  AMB   LOCATION:  [facility]                           FACILITY:  [facility]   PHYSICIAN:  Guy Sandifer. Tomblin II, M.D.DATE OF BIRTH:  Sep 01, 1965   DATE OF ADMISSION:  10/06/2004  DATE OF DISCHARGE:                                HISTORY & PHYSICAL   CHIEF COMPLAINT:  Leaking urine and desires permanent sterilization.   HISTORY OF PRESENT ILLNESS:  This patient is a 45 year old separated white  female, G6, P1 who leaks urine with any cough, sneeze, bearing down etc. She  has to wear a pad all of the time. It is becoming increasingly difficult for  her. Screening urodynamics in the office was negative for any evidence of  detrusor spasm. After careful discussion of the options, success and failure  rates and complications, the patient is being admitted for a Urotex  suburethral sling. She also requests permanent sterilization. The permanence  of the procedure as well as options of contraception have been reviewed  preoperatively. The potential risks and complications of the above surgery  have also been reviewed. Finally, she does have a history of endometriosis  and has noticed increased pain and low back pain before her menses over the  last several months.   PAST MEDICAL HISTORY:  1.  Endometriosis.  2.  Irritable bowel syndrome.  3.  Vulvar herpes.  4.  Phlebitis in her left leg postpartum in 1991.  5.  Bilateral retinal detachment.   PAST SURGICAL HISTORY:  1.  Laparoscopy in 2000.  2.  Repair of bilateral retinal detachment.  3.  Eardrum reconstruction.  4.  Lump removed from left leg at age 43.  5.  Hemorrhoidectomy x2.   OBSTETRIC HISTORY:  Termination x1. Miscarriage x4, fullterm delivery x1.   FAMILY HISTORY:  Diabetes in father and maternal grandmother, chronic  hypertension in father, heart disease in father, emphysema in mother.   SOCIAL HISTORY:  Denies,  tobacco, alcohol or drug abuse.   MEDICATIONS:  Tylenol p.r.n., Aciclovir daily.   ALLERGIES:  PENICILLIN leading to itching and hives as a child.   REVIEW OF SYMPTOMS:  NEUROLOGIC:  Denies headache. CARDIO:  Denies chest  pain. PULMONARY:  Denies shortness of breath. GI:  History of IBS as above.  EXTREMITIES:  History of phlebitis, left lower extremity.   PHYSICAL EXAMINATION:  VITAL SIGNS:  Height 5 feet 9 1/2 inches, weight 174  pounds, blood pressure 118/76.  HEENT:  Without thyromegaly.  LUNGS:  Clear to auscultation.  HEART:  Regular rate and rhythm.  BACK:  Without CVA tenderness.  BREASTS:  Without mass, traction or discharge.  ABDOMEN:  Soft, nontender without masses.  PELVIC:  Vulva, vagina and cervix without lesions. Uterus is upper limits of  normal size, mobile. First degree cystocele noted. There is hypermobility at  the urethrovesical angle. Adnexa nontender without masses.  EXTREMITIES:  Some superficial varicosities are noted on the left lower  extremity. No erythema or induration.   ASSESSMENT:  1.  Stress urinary incontinence.  2.  Desires  permanent sterilization.   PLAN:  Urotex mid urethral sling and laparoscopy with tubal ligation.      JET/MEDQ  D:  10/03/2004  T:  10/03/2004  Job:  [id number]

## 2010-11-08 ENCOUNTER — Encounter: Payer: Self-pay | Admitting: Adult Health

## 2010-11-08 ENCOUNTER — Ambulatory Visit (INDEPENDENT_AMBULATORY_CARE_PROVIDER_SITE_OTHER): Payer: PRIVATE HEALTH INSURANCE | Admitting: Adult Health

## 2010-11-08 VITALS — BP 120/70 | HR 69 | Temp 98.9°F | Ht 68.0 in | Wt 174.0 lb

## 2010-11-08 DIAGNOSIS — R05 Cough: Secondary | ICD-10-CM

## 2010-11-08 MED ORDER — TRAMADOL HCL 50 MG PO TABS: ORAL_TABLET | ORAL | Status: DC

## 2010-11-08 NOTE — Patient Instructions (Addendum)
May use Saline nasal rinses As needed   Saline gel At bedtime   Add Claritin 10mg  At bedtime  As needed  Drainage.  Continue on current meds.  follow up Dr. Sherene Sires  In 6 weeks and As needed

## 2010-11-08 NOTE — Assessment & Plan Note (Signed)
Multifactoral cough suspected secondary to ACE , reflux and rhinitis  Plan :  Cont on current rx  Add claritin As needed   Cough control precautions reinforced.  Saline nasal rinses  follow up Dr. Sherene Sires  In 6 weeks

## 2010-11-08 NOTE — Progress Notes (Signed)
Subjective:    Patient ID: Sharon [name], female    DOB: 1965-07-01, 45 y.o.   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  10/11/2010 Initial pulmonary office eval on ACEI ccc  new onset for 2 years constant sob esp with talking assoc with chest tightness and a dry cough.  Rarely happening at night. Assoc with overt HB.  Neg w/u with chest ct, spirometry 4/17 classic vcd pattern.    Sob is the same at rest vs walking.  Mild nasal congestion chronically also but no purulent secretions, no response to anything for nose including inhaled steroids so stopped fu with Dr Ezzard Standing.  Nothing also ever helps breathing in terms of meds but wearing a bra or speaking makes it much worse.  rec Stop lisinopril Start bystolic 5 mg one daily Start dexilant 60 mg Take 30-60 min before first meal of the day and Zantac 300mg  one at bedtime GERD (REFLUX) diet  10/25/2010 ov/ Wert  Cc no longer with noct respiratory events,  Trying to weaning off afrin x 4 days stopped and has noticed increase throat scratchiness since then.  Taking one half at clonazepam at bedtime and not sleeping well. >>tx w/ steroid taper, CT sinus neg  11/08/10 Follow up  Pt returns for follow up of cough. She is feeling much better. Cough is almost totally gone. Breathing is better.  Tramadol is helping to control cough. Does have drainage in throat. CT sinus last ov was neg for sinusitis.  Drainage worse at night. Nasal dryness in am. Could not tolerate delsym or chlor tabs     Review of Systems Constitutional:   No  weight loss, night sweats,  Fevers, chills, fatigue, or  lassitude.  HEENT:   No headaches,  Difficulty swallowing,  Tooth/dental problems, or  Sore throat,                No sneezing, itching, ear ache, nasal congestion, + post nasal drip,   CV:  No chest pain,   Orthopnea, PND, swelling in lower extremities, anasarca, dizziness, palpitations, syncope.   GI  No heartburn, indigestion, abdominal pain, nausea, vomiting, diarrhea, change in bowel habits, loss of appetite, bloody stools.   Resp: No shortness of breath with exertion or at rest.  No excess mucus, no productive cough,  No non-productive cough,  No coughing up of blood.  No change in color of mucus.  No wheezing.  No chest wall deformity  Skin: no rash or lesions.  GU: no dysuria, change in color of urine, no urgency or frequency.  No flank pain, no hematuria   MS:  No joint pain or swelling.  No decreased range of motion.  Marland Kitchen  Psych:  No change in mood or affect. No depression or anxiety.   .         Objective:   Physical Exam GEN: A/Ox3; pleasant , NAD, well nourished   HEENT:  [place]/AT,  EACs-clear, TMs-wnl, NOSE-clear, THROAT-clear, no lesions, no postnasal drip or exudate noted.   NECK:  Supple w/ fair ROM; no JVD; normal carotid impulses w/o bruits; no thyromegaly or nodules palpated; no lymphadenopathy.  RESP  Clear  P & A; w/o, wheezes/ rales/ or rhonchi.no accessory muscle use, no dullness to percussion  CARD:  RRR, no m/r/g  , no peripheral edema, pulses intact,  no cyanosis or clubbing.  GI:   Soft & nt; nml bowel sounds; no organomegaly or masses detected.  Musco: Warm bil, no deformities or joint swelling noted.   Neuro: alert, no focal deficits noted.    Skin: Warm, no lesions or rashes         Assessment & Plan:

## 2010-12-22 ENCOUNTER — Ambulatory Visit (INDEPENDENT_AMBULATORY_CARE_PROVIDER_SITE_OTHER): Payer: PRIVATE HEALTH INSURANCE | Admitting: Internal Medicine

## 2010-12-22 ENCOUNTER — Encounter: Payer: Self-pay | Admitting: Internal Medicine

## 2010-12-22 VITALS — BP 112/60 | HR 76 | Temp 98.7°F | Ht 68.0 in | Wt 173.0 lb

## 2010-12-22 DIAGNOSIS — R05 Cough: Secondary | ICD-10-CM

## 2010-12-22 DIAGNOSIS — I10 Essential (primary) hypertension: Secondary | ICD-10-CM

## 2010-12-22 MED ORDER — PANTOPRAZOLE SODIUM 40 MG PO TBEC: 40.0000 mg | DELAYED_RELEASE_TABLET | Freq: Every day | ORAL | Status: DC

## 2010-12-22 MED ORDER — RANITIDINE HCL 150 MG PO TABS: 150.0000 mg | ORAL_TABLET | Freq: Every day | ORAL | Status: DC

## 2010-12-22 NOTE — Patient Instructions (Addendum)
Stop tramadol - if still having sense of throat irritation at bedtime add chlortrimeton 4mg  one at bedtime  Continue bystolic 5 mg one daily Replace the dexilant when it's finished with pantoprazole 40 mg one in am  GERD (REFLUX)  is an extremely common cause of respiratory symptoms, many times with no significant heartburn at all.    It can be treated with medication, but also with lifestyle changes including avoidance of late meals, excessive alcohol, smoking cessation, and avoid fatty foods, chocolate, peppermint, colas, red wine, and acidic juices such as orange juice.  NO MINT OR MENTHOL PRODUCTS SO NO COUGH DROPS  USE SUGARLESS CANDY INSTEAD (jolley ranchers or Stover's)  NO OIL BASED VITAMINS   Please schedule a follow up office visit in 4 weeks, sooner if needed

## 2010-12-22 NOTE — Progress Notes (Signed)
  Subjective:    Patient ID: Sharon [name], female    DOB: 30-Nov-1965, 45 y.o.   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  10/11/2010 Initial pulmonary office eval on ACEI ccc  new onset for 2 years constant sob esp with talking assoc with chest tightness and a dry cough.  Rarely happening at night. Assoc with overt HB.  Neg w/u with chest ct, spirometry 4/17 classic vcd pattern.    Sob is the same at rest vs walking.  Mild nasal congestion chronically also but no purulent secretions, no response to anything for nose including inhaled steroids so stopped fu with Dr Ezzard Standing.  Nothing also ever helps breathing in terms of meds but wearing a bra or speaking makes it much worse.  rec Stop lisinopril Start bystolic 5 mg one daily Start dexilant 60 mg Take 30-60 min before first meal of the day and Zantac 300mg  one at bedtime GERD (REFLUX) diet  10/25/2010 ov/ Wert  Cc no longer with noct respiratory events,  Trying to weaning off afrin x 4 days stopped and has noticed increase throat scratchiness since then.  Taking one half at clonazepam at bedtime and not sleeping well. >>tx w/ steroid taper, CT sinus neg  11/08/10 Follow up  Pt returns for follow up of cough. She is feeling much better. Cough is almost totally gone. Breathing is better.  Tramadol is helping to control cough. Does have drainage in throat. CT sinus last ov was neg for sinusitis.  Drainage worse at night. Nasal dryness in am. Could not tolerate delsym or chlor tabs  May use Saline nasal rinses As needed   Saline gel At bedtime   Add Claritin 10mg  At bedtime  As needed  Drainage.  Continue on current meds including dexilant before brfast and Zantac at bedtime.       12/22/2010 Wert/ov cc minimal cough maybe every couple of days maybe a minute then goes away on no cough  suppressants and no clariton at this poin and no sob   Pt denies any significant sore throat, dysphagia, itching, sneezing,  nasal congestion or excess/ purulent secretions,  fever, chills, sweats, unintended wt loss, pleuritic or exertional cp, hempoptysis, orthopnea pnd or leg swelling.    Also denies any obvious fluctuation of symptoms with weather or environmental changes or other aggravating or alleviating factors.               Objective:   Physical Exam  Wt  12/22/2010  173  GEN: A/Ox3; pleasant , NAD, well nourished   HEENT:  [place]/AT,  EACs-clear, TMs-wnl, NOSE-clear, THROAT-clear, no lesions, no postnasal drip or exudate noted.   NECK:  Supple w/ fair ROM; no JVD; normal carotid impulses w/o bruits; no thyromegaly or nodules palpated; no lymphadenopathy.  RESP  Clear  P & A; w/o, wheezes/ rales/ or rhonchi.no accessory muscle use, no dullness to percussion  CARD:  RRR, no m/r/g  , no peripheral edema, pulses intact, no cyanosis or clubbing.  GI:   Soft & nt; nml bowel sounds; no organomegaly or masses detected.  Musco: Warm bil, no deformities or joint swelling noted.           Assessment & Plan:

## 2010-12-22 NOTE — Assessment & Plan Note (Addendum)
Classic Upper airway cough syndrome, so named because it's frequently impossible to sort out how much is  CR/sinusitis with freq throat clearing (which can be related to primary GERD)   vs  causing  secondary (" extra esophageal")  GERD from wide swings in gastric pressure that occur with throat clearing, often  promoting self use of mint and menthol lozenges that reduce the lower esophageal sphincter tone and exacerbate the problem further in a cyclical fashion.   These are the same pts who not infrequently have failed to tolerate ace inhibitors,  dry powder inhalers or biphosphonates or report having reflux symptoms that don't respond to standard doses of PPI , and are easily confused as having aecopd or asthma flares,   Try adding 1st gen H1 per guidelines for nocturnal coughing (if can tolerate)   Next step might be allergy w/u or ENT eval but in meantime will keep suppressing acid since GERD is the one cause of cough that can potentiate the other two most common (asthma and pnds)

## 2010-12-23 ENCOUNTER — Encounter: Payer: Self-pay | Admitting: Internal Medicine

## 2010-12-23 NOTE — Assessment & Plan Note (Signed)
Ok off ace and on bystolic which I would continue at present doses

## 2010-12-29 ENCOUNTER — Telehealth (INDEPENDENT_AMBULATORY_CARE_PROVIDER_SITE_OTHER): Payer: Self-pay

## 2010-12-29 NOTE — Telephone Encounter (Signed)
Pt last seen 14 mo ago for fissure. Pt request refill Diltiazem. Pt advised will have to review this with Dr Donell Beers since it has been soo long since pt was seen in office. Pt c/o rectal pain and bleeding. Pharmacy is [facility]. Pt can be reached at [phone].

## 2011-01-03 ENCOUNTER — Telehealth: Payer: Self-pay | Admitting: Internal Medicine

## 2011-01-03 NOTE — Telephone Encounter (Signed)
Pt can have refill and follow up w me in the next month.

## 2011-01-03 NOTE — Telephone Encounter (Signed)
Chlortrimeton is OTC medication. [id number]. Carron Curie, CMA

## 2011-01-03 NOTE — Telephone Encounter (Signed)
Pt aware this medication is OTC and to ask her pharmacist if she has trouble locating.

## 2011-01-19 ENCOUNTER — Other Ambulatory Visit (INDEPENDENT_AMBULATORY_CARE_PROVIDER_SITE_OTHER): Payer: PRIVATE HEALTH INSURANCE

## 2011-01-19 ENCOUNTER — Encounter: Payer: Self-pay | Admitting: Internal Medicine

## 2011-01-19 ENCOUNTER — Ambulatory Visit (INDEPENDENT_AMBULATORY_CARE_PROVIDER_SITE_OTHER): Payer: PRIVATE HEALTH INSURANCE | Admitting: Internal Medicine

## 2011-01-19 VITALS — BP 106/68 | HR 65 | Temp 98.2°F | Ht 68.0 in | Wt 174.0 lb

## 2011-01-19 DIAGNOSIS — R05 Cough: Secondary | ICD-10-CM

## 2011-01-19 LAB — CBC WITH DIFFERENTIAL/PLATELET
Basophils Absolute: 0 10*3/uL (ref 0.0–0.1)
HCT: 36.8 % (ref 36.0–46.0)
Lymphs Abs: 1.2 10*3/uL (ref 0.7–4.0)
MCV: 90.8 fl (ref 78.0–100.0)
Monocytes Absolute: 0.5 10*3/uL (ref 0.1–1.0)
Neutrophils Relative %: 67 % (ref 43.0–77.0)
Platelets: 284 10*3/uL (ref 150.0–400.0)
RDW: 13.5 % (ref 11.5–14.6)
WBC: 5.7 10*3/uL (ref 4.5–10.5)

## 2011-01-19 MED ORDER — NEBIVOLOL HCL 5 MG PO TABS: 5.0000 mg | ORAL_TABLET | Freq: Every day | ORAL | Status: DC

## 2011-01-19 NOTE — Patient Instructions (Signed)
Add chlortrimeton 4mg  one at bedtime and as needed during the day for the tickle (much stronger than clariton)  We checked for allergies and we call you the results  Continue bystolic 5 mg one daily   If you are satisfied with your treatment plan let your doctor know and he/she can either refill your medications or you can return here when your prescription runs out.     If in any way you are not 100% satisfied,  please tell us.  If 100% better, tell your friends!

## 2011-01-19 NOTE — Assessment & Plan Note (Signed)
Of the three most common causes of chronic cough, only one (GERD)  can actually cause the other two (asthma and post nasal drip syndrome)  and perpetuate the cylce of cough inducing airway trauma, inflammation, heightened sensitivity to reflux which is prompted by the cough itself via a cyclical mechanism.    This may partially respond to steroids and look like asthma and post nasal drainage but never erradicated completely unless the cough and the secondary reflux are eliminated, preferably both at the same time.  While not intuitively obvious, many patients with chronic low grade reflux do not cough until there is a secondary insult that disturbs the protective epithelial barrier and exposes sensitive nerve endings.  This can be viral or direct physical injury such as with an endotracheal tube.   The point is that once this occurs, it is difficult to eliminate using anything but a maximally effective acid suppression regimen at least in the short run, accompanied by an appropriate diet to address non acid GERD.   Add H1 at hs per guidelines to see if helps am cough

## 2011-01-19 NOTE — Progress Notes (Signed)
Subjective:    Patient ID: Sharon [name], female    DOB: [birth year], 45 y.o.   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  10/11/2010 Initial pulmonary office eval on ACEI ccc  new onset for 2 years constant sob esp with talking assoc with chest tightness and a dry cough.  Rarely happening at night. Assoc with overt HB.  Neg w/u with chest ct, spirometry 4/17 classic vcd pattern.    Sob is the same at rest vs walking.  Mild nasal congestion chronically also but no purulent secretions, no response to anything for nose including inhaled steroids so stopped fu with Dr Ezzard Standing.  Nothing also ever helps breathing in terms of meds but wearing a bra or speaking makes it much worse.  rec Stop lisinopril Start bystolic 5 mg one daily Start dexilant 60 mg Take 30-60 min before first meal of the day and Zantac 300mg  one at bedtime GERD (REFLUX) diet  10/25/2010 ov/ Wert  Cc no longer with noct respiratory events,  Trying to weaning off afrin x 4 days stopped and has noticed increase throat scratchiness since then.  Taking one half at clonazepam at bedtime and not sleeping well. >>tx w/ steroid taper, CT sinus neg  11/08/10   follow up of cough>  feeling much better. Cough is almost totally gone. Breathing is better.  Tramadol is helping to control cough. Does have drainage in throat. CT sinus last ov was neg for sinusitis.  Drainage worse at night. Nasal dryness in am. Could not tolerate delsym or chlor tabs  May use Saline nasal rinses As needed   Saline gel At bedtime   Add Claritin 10mg  At bedtime  As needed  Drainage.  Continue on current meds including dexilant before brfast and Zantac at bedtime.       12/22/2010 Wert/ov cc minimal cough maybe every couple of days maybe a minute then goes away on no cough suppressants and no clariton  at this poin and no sob. Stop tramadol - if still having sense of throat irritation at bedtime add back chlortrimeton 4mg  one just  at bedtime  Continue bystolic 5 mg one daily Replace the dexilant when it's finished with pantoprazole 40 mg one in am  GERD (REFLUX) diet   01/19/2011 f/u ov/Wert cc  Still has occ tickle , no problem sleeping  - did not start the chlortrimeton - didn't know she could get it otc (and doubt she ever took it before).  No sob  Pt denies any significant sore throat, dysphagia, itching, sneezing,  nasal congestion or excess/ purulent secretions,  fever, chills, sweats, unintended wt loss, pleuritic or exertional cp, hempoptysis, orthopnea pnd or leg swelling.    Also denies any obvious fluctuation of symptoms with weather or environmental changes or other aggravating or alleviating factors.               Objective:   Physical Exam  Wt  12/22/2010  173 >  174 01/19/2011   GEN: A/Ox3; pleasant , NAD, well nourished   HEENT:  [place]/AT,  EACs-clear, TMs-wnl, NOSE-clear, THROAT-clear, no lesions, no postnasal drip or exudate noted.   NECK:  Supple w/ fair ROM; no JVD; normal carotid impulses w/o bruits; no thyromegaly or nodules palpated; no lymphadenopathy.  RESP  Clear  P & A; w/o, wheezes/ rales/  or rhonchi.no accessory muscle use, no dullness to percussion  CARD:  RRR, no m/r/g  , no peripheral edema, pulses intact, no cyanosis or clubbing.  GI:   Soft & nt; nml bowel sounds; no organomegaly or masses detected.  Musco: Warm bil, no deformities or joint swelling noted.           Assessment & Plan:

## 2011-01-20 ENCOUNTER — Ambulatory Visit (INDEPENDENT_AMBULATORY_CARE_PROVIDER_SITE_OTHER): Payer: Self-pay | Admitting: General Surgery

## 2011-01-20 LAB — ALLERGY PROFILE REGION II-DC, DE, MD, ~~LOC~~, VA
Allergen, D pternoyssinus,d7: 0.31 kU/L (ref <0.35)
Alternaria Alternata: 0.12 kU/L (ref <0.35)
Aspergillus fumigatus, IgG: 0.18 kU/L (ref <0.35)
Bermuda Grass: 12.6 kU/L — ABNORMAL HIGH (ref <0.35)
Box Elder IgE: 10.7 kU/L — ABNORMAL HIGH (ref <0.35)
Cat Dander: <0.1 kU/L (ref <0.35)
Cockroach: 7.35 kU/L — ABNORMAL HIGH (ref <0.35)
Common Ragweed: 11.4 kU/L — ABNORMAL HIGH (ref <0.35)
Dog Dander: 0.19 kU/L (ref <0.35)
Elm IgE: 10.3 kU/L — ABNORMAL HIGH (ref <0.35)
IgE (Immunoglobulin E), Serum: 21.9 IU/mL (ref 0.0–180.0)
Johnson Grass: 11.3 kU/L — ABNORMAL HIGH (ref <0.35)
Meadow Grass: 12.4 kU/L — ABNORMAL HIGH (ref <0.35)
Pecan/Hickory Tree IgE: 6.75 kU/L — ABNORMAL HIGH (ref <0.35)

## 2011-01-24 ENCOUNTER — Encounter: Payer: Self-pay | Admitting: Internal Medicine

## 2011-01-24 NOTE — Progress Notes (Signed)
Quick Note:  Spoke with pt and notified of results per Dr. Wert. Pt verbalized understanding and denied any questions.  ______ 

## 2011-04-23 ENCOUNTER — Other Ambulatory Visit: Payer: Self-pay | Admitting: Internal Medicine

## 2011-05-05 ENCOUNTER — Ambulatory Visit (INDEPENDENT_AMBULATORY_CARE_PROVIDER_SITE_OTHER): Payer: BC Managed Care – PPO | Admitting: General Surgery

## 2011-05-05 ENCOUNTER — Encounter (INDEPENDENT_AMBULATORY_CARE_PROVIDER_SITE_OTHER): Payer: Self-pay | Admitting: General Surgery

## 2011-05-05 VITALS — BP 130/86 | HR 76 | Temp 97.6°F | Resp 12 | Ht 68.5 in | Wt 167.2 lb

## 2011-05-05 DIAGNOSIS — K602 Anal fissure, unspecified: Secondary | ICD-10-CM | POA: Insufficient documentation

## 2011-05-05 NOTE — Progress Notes (Signed)
Chief Complaint  Patient presents with  . Other    est pt new prob- eval anal fissure    HISTORY: Pt presents with recurrent anal fissure.  She has excruciating pain and bleeding with bowel movements.  She has an issue with her stools and has been recently constipated.  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  She normally does well unless she gets a piece of stool stuck and it gets firm.  When this happens, it exacerbates the fissure.  She has had a DVT in one of her legs and had to be on blood thinners for 6 months.  Now that she is off, she will follow up with Dr. Henderson Cloud to discuss repair of the rectocele.  She has been taking stool softeners and has been eating fruits and vegetables.    Past Medical History  Diagnosis Date  . Hypertension   . Pulmonary embolism   . DVT (deep venous thrombosis)     left leg  . Endometriosis   . Detached retina   . History of hemorrhoids   . Anal fissure     Past Surgical History  Procedure Date  . Tubal ligation   . Inner ear surgery   . Vein surgery 04/2010  . Hemorrhoid surgery   . Retinal detachment surgery   . Incontinence surgery   . Endometrial ablation     Current Outpatient Prescriptions  Medication Sig Dispense Refill  . acyclovir (ZOVIRAX) 400 MG tablet Take 1 tablet by mouth daily.      . clonazePAM (KLONOPIN) 0.5 MG tablet 1/2 twice daily       . nebivolol (BYSTOLIC) 5 MG tablet Take 1 tablet (5 mg total) by mouth daily.  30 tablet  11  . ranitidine (ZANTAC) 150 MG tablet Take 1 tablet (150 mg total) by mouth at bedtime.  30 tablet  1     Allergies  Allergen Reactions  . BJY:NWGNFA Dye+Dextromethorphan+Edetic Acid+Methylparaben+...     Pt states caused "a lump" in her throat, making it difficult to swallow  . Penicillins     Unknown      Family History  Problem Relation Age of Onset  . Emphysema Mother     smoker  . Allergies Mother    . Asthma Mother   . Heart disease Mother   . Asthma Father   . Heart disease Father   . Asthma Brother   . Lung cancer Maternal Grandfather     was a smoker  . Cancer Maternal Grandfather     lung  . Heart disease Paternal Grandmother   . Heart disease Paternal Grandfather      History   Social History  . Marital Status: Married    Spouse Name: N/A    Number of Children: 1  . Years of Education: N/A   Occupational History  . International aid/development worker    Social History Main Topics  . Smoking status: Never Smoker   . Smokeless tobacco: Never Used  . Alcohol Use: Yes     2 drinks per wk  . Drug Use: No     former maryjuana use- quit in 1995  . Sexually Active: None   REVIEW OF SYSTEMS - PERTINENT POSITIVES ONLY: 12 point review of systems negative other than HPI and PMH.   EXAMCeasar Mons Vitals:   05/05/11 0954  BP: 130/86  Pulse: 76  Temp: 97.6 F (36.4  C)  Resp: 12    Gen:  No acute distress.  Well nourished and well groomed.  Neurological: Alert and oriented to person, place, and time. Coordination normal.  Head: Normocephalic and atraumatic.  Eyes: Conjunctivae are normal. Pupils are equal, round, and reactive to light. No scleral icterus.  Neck: Normal range of motion. Cardiovascular: Normal rate. Respiratory: Effort normal.  No respiratory distress.  GI: Soft. The abdomen is soft and nontender.  There is no rebound and no guarding.  Musculoskeletal: Normal range of motion. Extremities are nontender.  Skin: Skin is warm and dry. No rash noted. No diaphoresis. No erythema. No pallor. No clubbing, cyanosis, or edema.   Psychiatric: Normal mood and affect. Behavior is normal. Judgment and thought content normal.   ASSESSMENT AND PLAN:   Anal fissure Diltiazem cream  Stool softeners. Follow up with Dr. Henderson Cloud.for gyn surgery.   See me back in 6 weeks.      Maudry Diego MD Surgical Oncology, General and Endocrine Surgery [facility]   Visit Diagnoses: 1. Anal fissure     Primary Care Physician: Abbe Amsterdam, MD

## 2011-05-05 NOTE — Assessment & Plan Note (Signed)
Diltiazem cream  Stool softeners. Follow up with Dr. Henderson Cloud.for gyn surgery.   See me back in 6 weeks.

## 2011-05-09 ENCOUNTER — Encounter: Payer: Self-pay | Admitting: *Deleted

## 2011-05-09 ENCOUNTER — Encounter: Payer: Self-pay | Admitting: Cardiovascular Disease

## 2011-05-09 ENCOUNTER — Ambulatory Visit (INDEPENDENT_AMBULATORY_CARE_PROVIDER_SITE_OTHER): Payer: Self-pay | Admitting: Cardiovascular Disease

## 2011-05-09 DIAGNOSIS — R079 Chest pain, unspecified: Secondary | ICD-10-CM

## 2011-05-09 DIAGNOSIS — R0600 Dyspnea, unspecified: Secondary | ICD-10-CM

## 2011-05-09 DIAGNOSIS — R002 Palpitations: Secondary | ICD-10-CM

## 2011-05-09 DIAGNOSIS — R0989 Other specified symptoms and signs involving the circulatory and respiratory systems: Secondary | ICD-10-CM

## 2011-05-09 DIAGNOSIS — I10 Essential (primary) hypertension: Secondary | ICD-10-CM

## 2011-05-09 NOTE — Assessment & Plan Note (Signed)
Well controlled.  Continue current medications and low sodium Dash type diet.    

## 2011-05-09 NOTE — Progress Notes (Signed)
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  Had bad episode 4 years ago evaluated at [facility] care and not thought to be cardiac.  Positive family history.  History of PE and previous coumadin.  Dyspnea with SSCP and fatigue.  Can have aching or sharp pain contantly.  Not necessarily exertional.  No palpitaitons or syncope.  Has had vein stripping and no edema.  Not had ETT.  ECG recently at urgent care normal.  ? GERD being Rx by Dr Sherene Sires.  Dyspnea with no PND or orthopnea.  No recent evaluation of RV/LV function No active wheezing sputum cough or pleurisy  ROS: Denies fever, malais, weight loss, blurry vision, decreased visual acuity, cough, sputum, SOB, hemoptysis, pleuritic pain, palpitaitons, heartburn, abdominal pain, melena, lower extremity edema, claudication, or rash.  All other systems reviewed and negative   General: Affect appropriate Healthy:  appears stated age HEENT: normal Neck supple with no adenopathy JVP normal no bruits no thyromegaly Lungs clear with no wheezing and good diaphragmatic motion Heart:  S1/S2 no murmur,rub, gallop or click PMI normal Abdomen: benighn, BS positve, no tenderness, no AAA no bruit.  No HSM or HJR Distal pulses intact with no bruits No edema Neuro non-focal Skin warm and dry No muscular weakness  Medications Current Outpatient Prescriptions  Medication Sig Dispense Refill  . acyclovir (ZOVIRAX) 400 MG tablet Take 1 tablet by mouth daily.      . clonazePAM (KLONOPIN) 0.5 MG tablet 1/2 twice daily       . nebivolol (BYSTOLIC) 5 MG tablet Take 1 tablet (5 mg total) by mouth daily.  30 tablet  11  . NON FORMULARY Diltiazem hcl gel 2 % 4 times daily       . ranitidine (ZANTAC) 150 MG tablet Take 1 tablet (150 mg total) by mouth at bedtime.  30 tablet  1    Allergies PPI:RJJOAC dye+dextromethorphan+edetic acid+methylparaben+... and Penicillins  Family History: Family History  Problem Relation Age of Onset  . Emphysema Mother     smoker    . Allergies Mother   . Asthma Mother   . Heart disease Mother   . Asthma Father   . Heart disease Father   . Asthma Brother   . Lung cancer Maternal Grandfather     was a smoker  . Cancer Maternal Grandfather     lung  . Heart disease Paternal Grandmother   . Heart disease Paternal Grandfather     Social History: History   Social History  . Marital Status: Married    Spouse Name: N/A    Number of Children: 1  . Years of Education: N/A   Occupational History  . International aid/development worker    Social History Main Topics  . Smoking status: Never Smoker   . Smokeless tobacco: Never Used  . Alcohol Use: Yes     2 drinks per wk  . Drug Use: No     former maryjuana use- quit in 1995  . Sexually Active: Not on file   Other Topics Concern  . Not on file   Social History Narrative  . No narrative on file    Electrocardiogram: Done at [facility] no date on it NSR normal ECG  Assessment and Plan

## 2011-05-09 NOTE — Assessment & Plan Note (Signed)
F/U Dr wert doubt cardiac source  Echo for RV/LV function

## 2011-05-09 NOTE — Assessment & Plan Note (Signed)
Atypical with normal ECG  F/U stress echo 

## 2011-05-09 NOTE — Patient Instructions (Signed)
Your physician recommends that you schedule a follow-up appointment in: AS NEEDED Your physician recommends that you continue on your current medications as directed. Please refer to the Current Medication list given to you today. Your physician has requested that you have an echocardiogram. Echocardiography is a painless test that uses sound waves to create images of your heart. It provides your doctor with information about the size and shape of your heart and how well your heart's chambers and valves are working. This procedure takes approximately one hour. There are no restrictions for this procedure. DX  DYSPNEA Your physician has requested that you have a stress echocardiogram. For further information please visit [email or web address] Please follow instruction sheet as given. DX DYSPNEA

## 2011-05-12 ENCOUNTER — Other Ambulatory Visit: Payer: Self-pay | Admitting: *Deleted

## 2011-05-12 DIAGNOSIS — R0600 Dyspnea, unspecified: Secondary | ICD-10-CM

## 2011-05-24 ENCOUNTER — Other Ambulatory Visit (HOSPITAL_COMMUNITY): Payer: BC Managed Care – PPO | Admitting: Radiology

## 2011-05-29 ENCOUNTER — Telehealth: Payer: Self-pay | Admitting: *Deleted

## 2011-05-29 NOTE — Telephone Encounter (Signed)
LEFT MESSAGE FOR  PT TO  CALL BACK   DOES NOT NEED CAROTID . APPT CX NEEDS OTHER TESTS AS NOTED .Zack Seal

## 2011-05-30 NOTE — Telephone Encounter (Signed)
PT AWARE DOES NOT NEED CAROTID PER DR NISHAN  APPT CANCELLED./CY

## 2011-06-08 ENCOUNTER — Encounter: Payer: BC Managed Care – PPO | Admitting: Cardiology

## 2011-06-08 ENCOUNTER — Ambulatory Visit (HOSPITAL_COMMUNITY): Payer: BC Managed Care – PPO | Attending: Cardiology | Admitting: Radiology

## 2011-06-08 ENCOUNTER — Ambulatory Visit (INDEPENDENT_AMBULATORY_CARE_PROVIDER_SITE_OTHER): Payer: BC Managed Care – PPO | Admitting: Cardiovascular Disease

## 2011-06-08 DIAGNOSIS — R002 Palpitations: Secondary | ICD-10-CM

## 2011-06-08 DIAGNOSIS — R0609 Other forms of dyspnea: Secondary | ICD-10-CM

## 2011-06-08 DIAGNOSIS — R0600 Dyspnea, unspecified: Secondary | ICD-10-CM

## 2011-06-08 DIAGNOSIS — R0989 Other specified symptoms and signs involving the circulatory and respiratory systems: Secondary | ICD-10-CM | POA: Insufficient documentation

## 2011-06-08 DIAGNOSIS — I059 Rheumatic mitral valve disease, unspecified: Secondary | ICD-10-CM | POA: Insufficient documentation

## 2011-06-08 DIAGNOSIS — I079 Rheumatic tricuspid valve disease, unspecified: Secondary | ICD-10-CM | POA: Insufficient documentation

## 2011-06-08 DIAGNOSIS — Z8249 Family history of ischemic heart disease and other diseases of the circulatory system: Secondary | ICD-10-CM | POA: Insufficient documentation

## 2011-06-08 NOTE — Progress Notes (Signed)
Exercise Treadmill Test  Pre-Exercise Testing Evaluation Rhythm: normal sinus  Rate: 74   PR:  .15 QRS:  .07  QT:  .38 QTc: .42     Test  Exercise Tolerance Test Ordering MD: Charlton Haws, MD  Interpreting MD:  Charlton Haws, MD  Unique Test No: 1  Treadmill:  1  Indication for ETT: chest pain - rule out ischemia  Contraindication to ETT: No   Stress Modality: exercise - treadmill  Cardiac Imaging Performed: non   Protocol: standard Bruce - maximal  Max BP:  160/58  Max MPHR (bpm):  174 85% MPR (bpm):  147  MPHR obtained (bpm):  169 % MPHR obtained:  92  Reached 85% MPHR (min:sec):  6:30 Total Exercise Time (min-sec):  7:30  Workload in METS:  12.4 Borg Scale: 20  Reason ETT Terminated:  fatigue    ST Segment Analysis At Rest: normal ST segments - no evidence of significant ST depression With Exercise: no evidence of significant ST depression  Other Information Arrhythmia:  No Angina during ETT:  absent (0) Quality of ETT:  diagnostic  ETT Interpretation:  normal - no evidence of ischemia by ST analysis  Comments: Normal ETT Stages advanced at 2 minute intervals.  Vasovagal after peak exercise.  Occasional late in recovery  Recommendations: F/U with primary

## 2011-06-09 ENCOUNTER — Encounter: Payer: Self-pay | Admitting: *Deleted

## 2011-06-09 ENCOUNTER — Telehealth: Payer: Self-pay | Admitting: Cardiovascular Disease

## 2011-06-09 NOTE — Telephone Encounter (Signed)
New message:  Pt was seen yesterday and needs to get a note for work.  Please fax to [phone] to her attention.

## 2011-06-09 NOTE — Telephone Encounter (Signed)
WORK EXCUSE  FAXED TO PT'S WORK PLACE AT PT'S REQUEST./CY

## 2011-06-23 ENCOUNTER — Encounter (INDEPENDENT_AMBULATORY_CARE_PROVIDER_SITE_OTHER): Payer: BC Managed Care – PPO | Admitting: General Surgery

## 2011-06-27 ENCOUNTER — Other Ambulatory Visit: Payer: Self-pay | Admitting: Internal Medicine

## 2011-08-21 DIAGNOSIS — N816 Rectocele: Secondary | ICD-10-CM | POA: Insufficient documentation

## 2011-08-21 DIAGNOSIS — N814 Uterovaginal prolapse, unspecified: Secondary | ICD-10-CM | POA: Insufficient documentation

## 2011-08-31 ENCOUNTER — Other Ambulatory Visit: Payer: Self-pay | Admitting: Family Medicine

## 2011-08-31 MED ORDER — CLONAZEPAM 0.5 MG PO TABS: ORAL_TABLET | ORAL | Status: DC

## 2011-09-04 ENCOUNTER — Telehealth: Payer: Self-pay

## 2011-09-04 NOTE — Telephone Encounter (Signed)
Pt had LM on my VM requesting her clonazepam be [facility] into [facility]. Rx was phoned in on 08/31/11. Called pharmacy to check on Rx and they do not have a record of it. Gave Rx to them again over the phone as written in Epic on 08/31/11 and notified pt was done.

## 2011-10-23 ENCOUNTER — Telehealth (INDEPENDENT_AMBULATORY_CARE_PROVIDER_SITE_OTHER): Payer: Self-pay | Admitting: General Surgery

## 2011-10-23 ENCOUNTER — Telehealth (INDEPENDENT_AMBULATORY_CARE_PROVIDER_SITE_OTHER): Payer: Self-pay

## 2011-10-23 ENCOUNTER — Encounter (INDEPENDENT_AMBULATORY_CARE_PROVIDER_SITE_OTHER): Payer: Self-pay | Admitting: General Surgery

## 2011-10-23 ENCOUNTER — Ambulatory Visit (INDEPENDENT_AMBULATORY_CARE_PROVIDER_SITE_OTHER): Payer: BC Managed Care – PPO | Admitting: General Surgery

## 2011-10-23 VITALS — BP 147/83 | HR 77 | Temp 98.2°F | Ht 68.5 in | Wt 152.4 lb

## 2011-10-23 DIAGNOSIS — K602 Anal fissure, unspecified: Secondary | ICD-10-CM

## 2011-10-23 NOTE — Progress Notes (Signed)
HISTORY: Pt with recurrent severe anal pain after her hysterectomy and rectocele repair.  I did a PPH on her in 2011, then she developed an anal fissure that repeatedly heals and reopens when she has issues with constipation.  Since her surgery, where she had an anal exam and post op issues with bowel movements, she has had anal pain with BMs and bleeding.  She got a refill on her diltiazem cream, and she has been using it religiously for 5 weeks without relief.  She has not had as severe bleeding as she did with prior episodes.  She feels like the ripping sensation is posterior in location.  She has used some stool softeners.     PERTINENT REVIEW OF SYSTEMS: Otherwise negative.     EXAM: Head: Normocephalic and atraumatic.  Eyes:  Conjunctivae are normal. Pupils are equal, round, and reactive to light. No scleral icterus.  Neck:  Normal range of motion. Neck supple. No tracheal deviation present. No thyromegaly present.  Resp: No respiratory distress, normal effort. Abd: Rectal exam attempted, excruciating pain, so exam aborted.  Externally, there appears  Neurological: Alert and oriented to person, place, and time. Coordination normal.  Skin: Skin is warm and dry. No rash noted. No diaphoretic. No erythema. No pallor.  Psychiatric: Normal mood and affect. Normal behavior. Judgment and thought content normal.      ASSESSMENT AND PLAN:   Anal fissure Recurrent anal fissure  Diltiazem not working.  Was recently healed prior to surgery for rectocele and hysterectomy.  Think this will heal again given recent insult associated with surgical manipulation and constipation.  Will try nitroglycerin cream and increasing bowel regimen.  Follow up in 6 weeks.       Maudry Diego, MD Surgical Oncology, General & Endocrine Surgery [facility], Gwenyth Bouillon, MD, MD [place], Gwenlyn Found, MD

## 2011-10-23 NOTE — Assessment & Plan Note (Signed)
Recurrent anal fissure  Diltiazem not working.  Was recently healed prior to surgery for rectocele and hysterectomy.  Think this will heal again given recent insult associated with surgical manipulation and constipation.  Will try nitroglycerin cream and increasing bowel regimen.  Follow up in 6 weeks.

## 2011-10-23 NOTE — Telephone Encounter (Signed)
Called in Rx for Rectiv 0.4% ointment apply 1 inch intra-anally bid x 3 weeks to [facility].  Pt is aware.

## 2011-10-23 NOTE — Patient Instructions (Signed)
Take colace or drugstore equivalent 100 mg by mouth twice daily.  Take Miralax 17 gm by mouth once every 2-3 days.  Continue sitz baths 1-4 times/day.

## 2011-10-23 NOTE — Telephone Encounter (Signed)
Appt made on 12/05/11 for follow of an anal fissure/ rectal skin tag. Patient is requesting sooner appt with Dr Donell Beers due to pain. Please move sooner if possible.

## 2011-11-03 ENCOUNTER — Telehealth: Payer: Self-pay

## 2011-11-03 NOTE — Telephone Encounter (Signed)
Pharmacy called to request refill on clonazapam .5 mg  [facility] [phone]  bf

## 2011-11-03 NOTE — Telephone Encounter (Signed)
Pt requesting refill for alprazolam,states pharmacy has contacted [place] several times with no response,   Best phone number [phone]   [facility]

## 2011-11-05 MED ORDER — CLONAZEPAM 0.5 MG PO TABS: ORAL_TABLET | ORAL | Status: DC

## 2011-11-05 NOTE — Telephone Encounter (Signed)
Paper chart reviewed.  Authorized Rx.  Needs OV for additional refills.

## 2011-11-05 NOTE — Telephone Encounter (Signed)
Will review patient's paper chart.

## 2011-11-05 NOTE — Telephone Encounter (Signed)
Rx called in and patient notified.  Patient states that she would like all meds called in in the future versus faxed/e-rx'd b/c [facility]..Marland Kitchen

## 2011-11-30 ENCOUNTER — Ambulatory Visit: Payer: BC Managed Care – PPO

## 2011-11-30 ENCOUNTER — Ambulatory Visit (INDEPENDENT_AMBULATORY_CARE_PROVIDER_SITE_OTHER): Payer: BC Managed Care – PPO | Admitting: Emergency Medicine

## 2011-11-30 VITALS — BP 117/70 | HR 78 | Temp 98.7°F | Resp 16 | Ht 67.0 in | Wt 154.0 lb

## 2011-11-30 DIAGNOSIS — M79673 Pain in unspecified foot: Secondary | ICD-10-CM

## 2011-11-30 DIAGNOSIS — S9030XA Contusion of unspecified foot, initial encounter: Secondary | ICD-10-CM

## 2011-11-30 NOTE — Patient Instructions (Signed)
Contusion (Bruise) of Foot Injury to the foot causes bruises (contusions). Contusions are caused by bleeding from small blood vessels that allow blood to leak out into the muscles, cord-like structures that attach muscle to bone (tendons), and/or other soft tissue.  CAUSES  Contusions of the foot are common. Bruises are frequently seen from:  Contact sports injuries.   The use of medications that thin the blood (anti-coagulants).   Aspirin and non-steroidal anti-inflammatory agents that decrease the clotting ability.   People with vitamin deficiencies.  SYMPTOMS  Signs of foot injury include pain and swelling. At first there may be discoloration from blood under the skin. This will appear blue to purple in color. As the bruise ages, the color turns yellow. Swelling may limit the movement of the toes.  Complications from foot injury may include:  Collections of blood leading to disability if calcium deposits form. These can later limit movement in the foot.   Infection of the foot if there are breaks in the skin.   Rupture of the tendons that may need surgical repair.  DIAGNOSIS  Diagnosing foot injuries can be made by observation. If problems continue, X-rays may be needed to make sure there are no broken bones (fractures). Continuing problems may require physical therapy.  HOME CARE INSTRUCTIONS   Apply ice to the injury for 15 to 20 minutes, 3 to 4 times per day. Put the ice in a plastic bag and place a towel between the bag of ice and your skin.   An elastic wrap (like an Ace bandage) may be used to keep swelling down.   Keep foot elevated to reduce swelling and discomfort.   Try to avoid standing or walking while the foot is painful. Do not resume use until instructed by your caregiver. Then begin use gradually. If pain develops, decrease use and continue the above measures. Gradually increase activities that do not cause discomfort until you slowly have normal use.   Only take  over-the-counter or prescription medicines for pain, discomfort, or fever as directed by your caregiver. Use only if your caregiver has not given medications that would interfere.   Begin daily rehabilitation exercises when supportive wrapping is no longer needed.   Use ice massage for 10 minutes before and after workouts. Fill a large styrofoam cup with water and freeze. Tear a small amount of foam from the top so ice protrudes. Massage ice firmly over the injured area in a circle about the size of a softball.   Always eat a well balanced diet.   Follow all instructions for follow up with your caregiver, any orthopedic referrals, physical therapy and rehabilitation. Any delay in obtaining necessary care could result in delayed healing, and temporary or permanent disability.  SEEK IMMEDIATE MEDICAL CARE IF:   Your pain and swelling increase, or pain is uncontrolled with medications.   You have loss of feeling in your foot, or your foot turns cold or blue.   An oral temperature above 102 F (38.9 C) develops, not controlled by medication.   Your foot becomes warm to touch, or you have more pain with movement of your toes.   You have a foot contusion that does not improve in 1 or 2 days.   Skin is broken and signs of infection occur (drainage, increasing pain, fever, headache, muscle aches, dizziness or a general ill feeling).   You develop new, unexplained symptoms, or an increase of the symptoms that brought you to your caregiver.  MAKE SURE YOU:     Understand these instructions.   Will watch your condition.   Will get help right away if you are not doing well or get worse.  Document Released: 03/27/2006 Document Revised: 05/25/2011 Document Reviewed: 05/09/2011 Exit[employer] Patient Information 2012 [employer]. 

## 2011-11-30 NOTE — Progress Notes (Signed)
  Subjective:    Patient ID: Sharon [name], female    DOB: [birth year], 46 y.o.   MRN: [id number]  Foot Injury  The incident occurred 12 to 24 hours ago. The incident occurred at home. The injury mechanism was a fall. The pain is present in the right foot. The quality of the pain is described as aching. The pain is at a severity of 4/10. The pain is moderate. The pain has been constant since onset. Associated symptoms include an inability to bear weight. Pertinent negatives include no loss of motion, loss of sensation, muscle weakness, numbness or tingling. She reports no foreign bodies present. The symptoms are aggravated by weight bearing. She has tried elevation and ice for the symptoms.      Review of Systems  Constitutional: Negative.   HENT: Negative.   Eyes: Negative.   Respiratory: Negative.   Cardiovascular: Negative.   Gastrointestinal: Negative.   Genitourinary: Negative.   Musculoskeletal: Positive for joint swelling.  Neurological: Negative for tingling and numbness.       Objective:   Physical Exam  Nursing note and vitals reviewed. Constitutional: She is oriented to person, place, and time. She appears well-developed and well-nourished.  HENT:  Head: Normocephalic and atraumatic.  Eyes: Pupils are equal, round, and reactive to light. No scleral icterus.  Neck: Normal range of motion. Neck supple.  Cardiovascular: Normal rate.   Pulmonary/Chest: Effort normal.  Abdominal: Soft.  Musculoskeletal: She exhibits edema and tenderness.  Neurological: She is alert and oriented to person, place, and time.  Skin: Skin is warm and dry.          Assessment & Plan:  [facility] reading (PRIMARY) by  Dr. Dareen Piano.  No bony injury.  Moderate STS.  Elevate, ice and use boot.  Follow up in one week

## 2011-12-05 ENCOUNTER — Encounter (INDEPENDENT_AMBULATORY_CARE_PROVIDER_SITE_OTHER): Payer: BC Managed Care – PPO | Admitting: General Surgery

## 2011-12-30 ENCOUNTER — Other Ambulatory Visit: Payer: Self-pay

## 2011-12-30 MED ORDER — CLONAZEPAM 0.5 MG PO TABS: ORAL_TABLET | ORAL | Status: DC

## 2012-01-30 ENCOUNTER — Other Ambulatory Visit: Payer: Self-pay | Admitting: Internal Medicine

## 2012-01-31 ENCOUNTER — Other Ambulatory Visit: Payer: Self-pay

## 2012-01-31 MED ORDER — CLONAZEPAM 0.5 MG PO TABS: ORAL_TABLET | ORAL | Status: DC

## 2012-02-01 ENCOUNTER — Ambulatory Visit (INDEPENDENT_AMBULATORY_CARE_PROVIDER_SITE_OTHER): Payer: BC Managed Care – PPO | Admitting: Emergency Medicine

## 2012-02-01 VITALS — BP 106/68 | HR 68 | Temp 98.8°F | Resp 16 | Ht 68.0 in | Wt 154.0 lb

## 2012-02-01 DIAGNOSIS — F411 Generalized anxiety disorder: Secondary | ICD-10-CM

## 2012-02-01 DIAGNOSIS — R319 Hematuria, unspecified: Secondary | ICD-10-CM

## 2012-02-01 DIAGNOSIS — I10 Essential (primary) hypertension: Secondary | ICD-10-CM

## 2012-02-01 DIAGNOSIS — F419 Anxiety disorder, unspecified: Secondary | ICD-10-CM

## 2012-02-01 LAB — POCT UA - MICROSCOPIC ONLY
Casts, Ur, LPF, POC: NEGATIVE
Crystals, Ur, HPF, POC: NEGATIVE
Mucus, UA: POSITIVE
Yeast, UA: NEGATIVE

## 2012-02-01 LAB — POCT URINALYSIS DIPSTICK
Bilirubin, UA: NEGATIVE
Glucose, UA: NEGATIVE
Ketones, UA: NEGATIVE
Leukocytes, UA: NEGATIVE
Nitrite, UA: NEGATIVE
Protein, UA: NEGATIVE
Spec Grav, UA: >=1.03
Urobilinogen, UA: 0.2
pH, UA: 5.5

## 2012-02-01 MED ORDER — CLONAZEPAM 0.5 MG PO TABS: ORAL_TABLET | ORAL | Status: DC

## 2012-02-01 MED ORDER — NEBIVOLOL HCL 5 MG PO TABS: 5.0000 mg | ORAL_TABLET | Freq: Every day | ORAL | Status: DC

## 2012-02-01 NOTE — Progress Notes (Deleted)
  Subjective:    Patient ID: Sharon [name], female    DOB: 12/09/1965, 46 y.o.   MRN: [id number]  HPI    Review of Systems     Objective:   Physical Exam        Assessment & Plan:   

## 2012-02-01 NOTE — Progress Notes (Signed)
  Subjective:    Patient ID: KANSAS SPAINHOWER, female    DOB: 09-17-1965, 46 y.o.   MRN: [id number]  HPI  46 yo female.  She is having family problems.  Patient would like her bystolic refilled and clonazepam  refilled.  Complains of sore throat on left side.  She had a hysterectomy three months ago and bladder sling.  Has been seeing a little pink in her urine.  Pulmonary dr said she had reflux.  Feeling better with three medicines that he gave her.  Also had a rectal patch put in during surgery.  Also has a fissure that she is dealing with.  Needs something to help bowel movement and not make it cramp with it.  Cant take anything that makes her feel bulky.  Used to going morning and afternoon and hasnt been able to go as much since surgery.  Also has a hemmorrhoid.  Sees Dr. Donell Beers with [facility] surgery.    Review of Systems     Objective:   Physical Exam HEENT exam is unremarkable. Neck is supple. Chest is clear to auscultation and percussion. Abdomen soft nontender.        Assessment & Plan:  Patient under a lot of stress at home. We'll go ahead and refill her Klonopin is up and she is on for stress. Also refilled her Bystolic she is on for blood pressure control. Her blood pressure was excellent today

## 2012-02-01 NOTE — Progress Notes (Deleted)
  Subjective:    Patient ID: Sharon [name], female    DOB: Mar 08, 1966, 46 y.o.   MRN: [id number]  HPI    Review of Systems     Objective:   Physical Exam        Assessment & Plan:

## 2012-02-03 LAB — URINE CULTURE: Colony Count: NO GROWTH

## 2012-02-07 ENCOUNTER — Telehealth (INDEPENDENT_AMBULATORY_CARE_PROVIDER_SITE_OTHER): Payer: Self-pay

## 2012-02-07 NOTE — Telephone Encounter (Signed)
LMOV returning patient's phone call about "medication".

## 2012-02-08 ENCOUNTER — Telehealth: Payer: Self-pay

## 2012-02-08 NOTE — Telephone Encounter (Signed)
Urine culture did not show infection

## 2012-02-08 NOTE — Telephone Encounter (Signed)
Pt is calling about urine test results from [phone]. Would like someone to call her back.  Best [phone]

## 2012-02-08 NOTE — Telephone Encounter (Signed)
I called her back and she feels better, was advised if she needs anything else to let us know

## 2012-02-12 ENCOUNTER — Telehealth: Payer: Self-pay

## 2012-02-12 ENCOUNTER — Telehealth (INDEPENDENT_AMBULATORY_CARE_PROVIDER_SITE_OTHER): Payer: Self-pay | Admitting: General Surgery

## 2012-02-12 MED ORDER — LACTULOSE 10 GM/15ML PO SOLN: 20.0000 g | Freq: Three times a day (TID) | ORAL | Status: AC

## 2012-02-12 NOTE — Telephone Encounter (Signed)
KT call-in lactulose 10 g per 15 mL. She can take 15 mL one to 2 times daily. She can have a one-month supply.

## 2012-02-12 NOTE — Telephone Encounter (Signed)
Please advise... Can we rx?

## 2012-02-12 NOTE — Telephone Encounter (Signed)
I have sent this in for her, and have called patient to advise.

## 2012-02-12 NOTE — Telephone Encounter (Signed)
Patient called from [phone] in stating she saw her pcp about her blood pressure medication and the stool softener is not working. Her doctor wanted to know if he can issue Lactulose (Cephulac). Did not want to issue without reviewing with Dr. Donell Beers first.

## 2012-02-12 NOTE — Telephone Encounter (Signed)
Fine to give whatever stool softener/ laxative

## 2012-02-12 NOTE — Telephone Encounter (Signed)
BURNIE FROM DR BYERLY'S OFFICE STATES PT SAID DR DAUB RECOMMENDED HER TO TAKE LACTULOSE CEPHULAC AND TOLD HER TO TELL DR BYERLY SO SHE CAN PRESCRIBE IT FOR PT. DR BYERLY STATES IF DR DAUB WANT HER TO HAVE THAT, HE CAN PRESCRIBE IT FOR HER, BUT SHE ISN'T GOING TO DO IT YOU MAY CALL [phone] IF NEEDED

## 2012-05-06 ENCOUNTER — Ambulatory Visit (INDEPENDENT_AMBULATORY_CARE_PROVIDER_SITE_OTHER): Payer: BC Managed Care – PPO | Admitting: General Surgery

## 2012-05-06 ENCOUNTER — Encounter (INDEPENDENT_AMBULATORY_CARE_PROVIDER_SITE_OTHER): Payer: Self-pay | Admitting: General Surgery

## 2012-05-06 VITALS — BP 118/78 | HR 79 | Temp 98.4°F | Ht 68.0 in | Wt 155.6 lb

## 2012-05-06 DIAGNOSIS — K602 Anal fissure, unspecified: Secondary | ICD-10-CM

## 2012-05-06 MED ORDER — NITROGLYCERIN 0.4 % RE OINT: 0.4000 [in_us] | TOPICAL_OINTMENT | Freq: Two times a day (BID) | RECTAL | Status: DC

## 2012-05-06 NOTE — Patient Instructions (Signed)
Follow up with Dr. Maisie Fus.    Continue stool softeners.    Have sent refill to pharmacy.

## 2012-05-06 NOTE — Assessment & Plan Note (Signed)
Will refer to Dr. Maisie Fus given prior rectocele repair and recurrent anal fissure

## 2012-05-07 NOTE — Progress Notes (Signed)
Pt with history of PPH in 2009 or 2010, excision of anal skin tag last year, and development of anal fissure this spring.  The fissure gets better with each course of Rectiv, but recurs within a few weeks afterward.  This was aggravated after she had a rectocele repair and had to take post op narcotics, experiencing constipation.    Now, her rectocele is recurrent as is her fissure.    I will refer to Dr. Maisie Fus, our colorectal specialist, given the history of rectocele and recurrent anal fissure.

## 2012-05-10 ENCOUNTER — Encounter (INDEPENDENT_AMBULATORY_CARE_PROVIDER_SITE_OTHER): Payer: Self-pay | Admitting: General Surgery

## 2012-05-10 ENCOUNTER — Ambulatory Visit (INDEPENDENT_AMBULATORY_CARE_PROVIDER_SITE_OTHER): Payer: BC Managed Care – PPO | Admitting: General Surgery

## 2012-05-10 VITALS — BP 120/60 | HR 72 | Temp 99.2°F | Resp 18 | Ht 68.0 in | Wt 158.0 lb

## 2012-05-10 DIAGNOSIS — K602 Anal fissure, unspecified: Secondary | ICD-10-CM

## 2012-05-10 NOTE — Progress Notes (Signed)
Chief Complaint  Patient presents with  . Anal Fissure    HISTORY:  MINAMI ARRIAGA is a 46 y.o. female who presents to clinic with anal fissures.  She has trouble with constipation and straining to get stools out.  She has to put a finger in her vagina to have a BM.  She has been having trouble with anal fissure for several years.  They heal with Rectiv cream but come back 2-3 wks later.  Currently she is using mineral oil to control her constipation.   Past Medical History  Diagnosis Date  . Hypertension   . Pulmonary embolism   . DVT (deep venous thrombosis)     left leg  . Endometriosis   . Detached retina   . History of hemorrhoids   . Anal fissure   . Blood clot in vein     left leg       Past Surgical History  Procedure Date  . Tubal ligation   . Inner ear surgery   . Vein surgery 04/2010  . Hemorrhoid surgery   . Retinal detachment surgery   . Incontinence surgery   . Endometrial ablation   . Abdominal hysterectomy   . Rectal prolapse repair       Current Outpatient Prescriptions  Medication Sig Dispense Refill  . acyclovir (ZOVIRAX) 400 MG tablet Take 1 tablet by mouth daily.      Marland Kitchen ALPRAZolam (XANAX) 0.5 MG tablet Take 0.5 mg by mouth as needed.      . clonazePAM (KLONOPIN) 0.5 MG tablet Take 1/2 tablet in the morning and 1 tablet at night  45 tablet  5  . nebivolol (BYSTOLIC) 5 MG tablet Take 1 tablet (5 mg total) by mouth daily.  30 tablet  11  . Nitroglycerin (RECTIV) 0.4 % OINT Place 0.4 inches rectally 2 (two) times daily. Apply 1 in of Rectiv intra-anally x 3 weeks.  1 Tube  3  . NON FORMULARY Diltiazem hcl gel 2 % 4 times daily       . ranitidine (ZANTAC) 150 MG tablet TAKE 1 TABLET (150 MG TOTAL) BY MOUTH AT BEDTIME.  30 tablet  0     Allergies  Allergen Reactions  . Dextromethorphan Polistirex Er     Pt states caused "a lump" in her throat, making it difficult to swallow  . Hydrocodone Nausea And Vomiting  . Penicillins     Unknown        Family History  Problem Relation Age of Onset  . Emphysema Mother     smoker  . Allergies Mother   . Asthma Mother   . Heart disease Mother   . Asthma Father   . Heart disease Father   . Asthma Brother   . Lung cancer Maternal Grandfather     was a smoker  . Cancer Maternal Grandfather     lung  . Heart disease Paternal Grandmother   . Heart disease Paternal Grandfather       History   Social History  . Marital Status: Married    Spouse Name: N/A    Number of Children: 1  . Years of Education: N/A   Occupational History  . International aid/development worker    Social History Main Topics  . Smoking status: Never Smoker   . Smokeless tobacco: Never Used  . Alcohol Use: Yes     Comment: 2 drinks per wk  . Drug Use: No     Comment: former maryjuana use- quit in  1995  . Sexually Active: None   Other Topics Concern  . None   Social History Narrative  . None       REVIEW OF SYSTEMS - PERTINENT POSITIVES ONLY: Review of Systems - General ROS: negative for - chills, fever or weight loss Hematological and Lymphatic ROS: negative for - bleeding problems or blood clots Respiratory ROS: no cough, shortness of breath, or wheezing Cardiovascular ROS: no chest pain or dyspnea on exertion Gastrointestinal ROS: no abdominal pain, change in bowel habits, or black or bloody stools  EXAM: Filed Vitals:   05/10/12 1419  BP: 120/60  Pulse: 72  Temp: 99.2 F (37.3 C)  Resp: 18    General appearance: alert, cooperative and no distress Resp: clear to auscultation bilaterally Cardio: regular rate and rhythm GI: soft, non-tender; bowel sounds normal; no masses,  no organomegaly Anal: chaperone Glaspey.  Post skin tag, ant skin tag, no palpable rectocele.  Unable to perform DRE  ASSESSMENT AND PLAN: TUMEKA CHIMENTI is a 46 y.o. 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  She has failed medical management.  I believe she would be a good candidate a chemical sphincterotomy.  We  discussed the risk of incontinence and I told her that this was a small risk and usually gets better with time.  We discussed her defecation issues.  I think she would benefit from MR defecography and manometry in the future.      Vanita Panda, MD Colon and Rectal Surgery / General Surgery [facility]      Visit Diagnoses: 1. Anal fissure     Primary Care Physician: Abbe Amsterdam, MD

## 2012-05-10 NOTE — Patient Instructions (Addendum)
WHAT IS AN ANAL FISSURE? An anal fissure (fissure-in-ano) is a small, oval shaped tear in skin that lines the opening of the anus. Fissures typically cause severe pain and bleeding with bowel movements. Fissures are quite common in the general population, but are often confused with other causes of pain and bleeding, such as hemorrhoids. WHAT ARE THE SYMPTOMS OF AN ANAL FISSURE? The typical symptoms of an anal fissure include severe pain during, and especially after, a bowel movement, lasting from several minutes to a few hours. Patients may also notice bright red blood from the anus that can be seen on the toilet paper or on the stool. Between bowel movements, patients with anal fissures are often relatively symptom-free. Many patients are fearful of having a bowel movement and may try to avoid defecation secondary to the pain.  WHAT CAUSES AN ANAL FISSURE? Fissures are usually caused by trauma to the inner lining of the anus. Patients with tight anal sphincter muscles (i.e., increased muscle tone) are more prone to developing anal fissures. A hard, dry bowel movement is typically responsible, but loose stools and diarrhea can also be the cause. Following a bowel movement, severe anal pain can produce spasm of the anal sphincter muscle, resulting in a decrease in blood flow to the site of the injury, thus impairing healing of the wound. The next bowel movement results in more pain, anal spasm, decreased blood flow to the area, and the cycle continues. Treatments are aimed at interrupting this cycle by relaxing the anal sphincter muscle to promote healing of the fissure.  Other, less common, causes include inflammatory conditions and certain anal infections or tumors. Anal fissures may be acute (recent onset) or chronic (present for a long period of time). Chronic fissures may be more difficult to treat, and may also have an external lump associated with the tear, called a sentinel pile or skin tag, as well as  extra tissue just inside the anal canal (hypertrophied papilla) . WHAT IS THE TREATMENT OF ANAL FISSURES? The majority of anal fissures do not require surgery. The most common treatment for an acute anal fissure consists of making the stool more formed and bulky with a diet high in fiber and utilization of over-the-counter fiber supplementation (totaling 25-35 grams of fiber/day). Stool softeners and increasing water intake may be necessary to promote soft bowel movements and aid in the healing process. Topical anesthetics for pain and warm tub baths (sitz baths) for 10-20 minutes several times a day (especially after bowel movements) are soothing and promote relaxation of the anal muscles, which may help the healing process.  Other medications (such as nitroglycerin, nifedipine, or diltiazem) may be prescribed that allow relaxation of the anal sphincter muscles. Your surgeon will go over benefits and side-effects of each of these with you. Narcotic pain medications are not recommended for anal fissures, as they promote constipation. Chronic fissures are generally more difficult to treat, and your surgeon may advise surgical treatment. WILL THE PROBLEM RETURN? Fissures can recur easily, and it is quite common for a fully healed fissure to recur after a hard bowel movement or other trauma. Even when the pain and bleeding have subsided, it is very important to continue good bowel habits and a diet high in fiber as a lifestyle change. If the problem returns without an obvious cause, further assessment is warranted. WHAT CAN BE DONE IF THE FISSURE DOES NOT HEAL? A fissure that fails to respond to conservative measures should be re-examined. Persistent hard or loose   bowel movements, scarring, or spasm of the internal anal muscle all contribute to delayed healing. Other medical problems such as inflammatory bowel disease (Crohn's disease), infections, or anal tumors can cause symptoms similar to anal fissures.  Patients suffering from persistent anal pain should be examined to exclude these symptoms. This may include a colonoscopy or an exam in the operating room under anesthesia. WHAT DOES SURGERY INVOLVE? Surgical options for treating anal fissure include Botulinum toxin (Botox) injection into the anal sphincter and surgical division of a portion of the internal anal sphincter (lateral internal sphincterotomy). Both of these are performed typically as outpatient, same-day procedures, or occasionally in the office setting. The goal of these surgical options is to promote relaxation of the anal sphincter, thereby decreasing anal pain and spasm, allowing the fissure to heal. Botox injection results in healing in 50-80% of patients, while sphincterotomy is reported to be over 90% successful. If a sentinel pile is present, it may be removed to promote healing of the fissure. All surgical procedures carry some risk, and a sphincterotomy can rarely interfere with one's ability to control gas and stool. Your colon and rectal surgeon will discuss these risks with you to determine the appropriate treatment for your particular situation. HOW LONG IS THE RECOVERY AFTER SURGERY? It is important to note that complete healing with both medical and surgical treatments can take up to approximately 6-10 weeks. However, acute pain after surgery often disappears after a few days. Most patients will be able to return to work and resume daily activities in a few short days after the surgery. CAN FISSURES LEAD TO COLON CANCER? Absolutely not. Persistent symptoms, however, need careful evaluation since other conditions other than an anal fissure can cause similar symptoms. Your colon and rectal surgeon may request additional tests, even if your fissure has successfully healed. A colonoscopy may be required to exclude other causes of rectal bleeding. WHAT IS A COLON AND RECTAL SURGEON? Colon and rectal surgeons are experts in the  surgical and non-surgical treatment of diseases of the colon, rectum and anus. They have completed advanced surgical training in the treatment of these diseases as well as full general surgical training. Board-certified colon and rectal surgeons complete residencies in general surgery and colon and rectal surgery, and pass intensive examinations conducted by the American Board of Surgery and the American Board of Colon and Rectal Surgery. They are well-versed in the treatment of both benign and malignant diseases of the colon, rectum and anus and are able to perform routine screening examinations and surgically treat conditions if indicated to do so.  Author: Michael A. Valente, DO, on behalf of the [employer] Public Relations Committee   2012 American Society of Colon & Rectal Surgeons    

## 2012-05-10 NOTE — Addendum Note (Signed)
Addended by: Vanita Panda on: 05/10/2012 03:30 PM   Modules accepted: Orders

## 2012-08-14 ENCOUNTER — Other Ambulatory Visit: Payer: Self-pay | Admitting: Emergency Medicine

## 2012-08-15 ENCOUNTER — Other Ambulatory Visit: Payer: Self-pay | Admitting: Radiology

## 2012-09-03 ENCOUNTER — Other Ambulatory Visit: Payer: Self-pay | Admitting: Obstetrics and Gynecology

## 2012-09-03 DIAGNOSIS — R928 Other abnormal and inconclusive findings on diagnostic imaging of breast: Secondary | ICD-10-CM

## 2012-09-12 ENCOUNTER — Ambulatory Visit
Admission: RE | Admit: 2012-09-12 | Discharge: 2012-09-12 | Disposition: A | Discharge status: Home / Self Care | Payer: BC Managed Care – PPO | Source: Ambulatory Visit | Attending: Obstetrics and Gynecology | Admitting: Obstetrics and Gynecology

## 2012-09-12 DIAGNOSIS — R928 Other abnormal and inconclusive findings on diagnostic imaging of breast: Secondary | ICD-10-CM

## 2013-02-26 ENCOUNTER — Other Ambulatory Visit: Payer: Self-pay | Admitting: Emergency Medicine

## 2013-02-28 ENCOUNTER — Other Ambulatory Visit: Payer: Self-pay | Admitting: Emergency Medicine

## 2013-02-28 NOTE — Telephone Encounter (Signed)
Please call and discuss with patient what she needs a medication for. She has not been here for over a year.

## 2013-03-02 NOTE — Telephone Encounter (Signed)
Please call the patient and give further history about why she needs the medication. I have not seen this patient for over a year

## 2013-04-01 ENCOUNTER — Other Ambulatory Visit: Payer: Self-pay | Admitting: Physician Assistant

## 2013-04-19 ENCOUNTER — Other Ambulatory Visit: Payer: Self-pay | Admitting: Physician Assistant

## 2013-05-09 ENCOUNTER — Ambulatory Visit: Payer: BC Managed Care – PPO

## 2013-05-09 ENCOUNTER — Ambulatory Visit (INDEPENDENT_AMBULATORY_CARE_PROVIDER_SITE_OTHER): Payer: BC Managed Care – PPO | Admitting: Emergency Medicine

## 2013-05-09 VITALS — BP 118/76 | HR 73 | Temp 98.5°F | Resp 18 | Ht 68.0 in | Wt 156.0 lb

## 2013-05-09 DIAGNOSIS — M25522 Pain in left elbow: Secondary | ICD-10-CM

## 2013-05-09 DIAGNOSIS — M25529 Pain in unspecified elbow: Secondary | ICD-10-CM

## 2013-05-09 DIAGNOSIS — R251 Tremor, unspecified: Secondary | ICD-10-CM

## 2013-05-09 DIAGNOSIS — Z833 Family history of diabetes mellitus: Secondary | ICD-10-CM

## 2013-05-09 DIAGNOSIS — R259 Unspecified abnormal involuntary movements: Secondary | ICD-10-CM

## 2013-05-09 DIAGNOSIS — F411 Generalized anxiety disorder: Secondary | ICD-10-CM

## 2013-05-09 DIAGNOSIS — I10 Essential (primary) hypertension: Secondary | ICD-10-CM

## 2013-05-09 DIAGNOSIS — K219 Gastro-esophageal reflux disease without esophagitis: Secondary | ICD-10-CM

## 2013-05-09 LAB — POCT CBC
Granulocyte percent: 80.5 %G — AB (ref 37–80)
HCT, POC: 45.4 % (ref 37.7–47.9)
Hemoglobin: 14.2 g/dL (ref 12.2–16.2)
MCH, POC: 30.3 pg (ref 27–31.2)
MCHC: 31.3 g/dL — AB (ref 31.8–35.4)
MCV: 96.7 fL (ref 80–97)
MID (cbc): 0.5 (ref 0–0.9)
MPV: 8.9 fL (ref 0–99.8)
POC Granulocyte: 8.2 — AB (ref 2–6.9)
Platelet Count, POC: 327 10*3/uL (ref 142–424)
RBC: 4.69 M/uL (ref 4.04–5.48)
RDW, POC: 13.5 %
WBC: 10.2 10*3/uL (ref 4.6–10.2)

## 2013-05-09 LAB — POCT URINALYSIS DIPSTICK
Bilirubin, UA: NEGATIVE
Glucose, UA: NEGATIVE
Ketones, UA: NEGATIVE
Leukocytes, UA: NEGATIVE
Protein, UA: NEGATIVE
Spec Grav, UA: 1.015
pH, UA: 6.5

## 2013-05-09 LAB — POCT UA - MICROSCOPIC ONLY
Bacteria, U Microscopic: NEGATIVE
Casts, Ur, LPF, POC: NEGATIVE

## 2013-05-09 LAB — LIPID PANEL
HDL: 50 mg/dL (ref >39)
Triglycerides: 56 mg/dL (ref <150)
VLDL: 11 mg/dL (ref 0–40)

## 2013-05-09 LAB — COMPREHENSIVE METABOLIC PANEL
Albumin: 4.8 g/dL (ref 3.5–5.2)
BUN: 13 mg/dL (ref 6–23)
CO2: 28 mEq/L (ref 19–32)
Calcium: 9.8 mg/dL (ref 8.4–10.5)
Chloride: 100 mEq/L (ref 96–112)
Creat: 0.68 mg/dL (ref 0.50–1.10)
Potassium: 4.3 mEq/L (ref 3.5–5.3)

## 2013-05-09 LAB — TSH: TSH: 1.231 u[IU]/mL (ref 0.350–4.500)

## 2013-05-09 MED ORDER — PANTOPRAZOLE SODIUM 40 MG PO TBEC: 40.0000 mg | DELAYED_RELEASE_TABLET | Freq: Every day | ORAL | Status: DC

## 2013-05-09 MED ORDER — NEBIVOLOL HCL 5 MG PO TABS: 5.0000 mg | ORAL_TABLET | Freq: Every day | ORAL | Status: DC

## 2013-05-09 MED ORDER — CLONAZEPAM 0.5 MG PO TABS: ORAL_TABLET | ORAL | Status: DC

## 2013-05-09 NOTE — Progress Notes (Addendum)
This chart was scribed for Sharon Chris, MD by [name], ED Scribe. This patient was seen in room Room/bed 3 and the patient's care was started at 9:51 AM. Subjective:    Patient ID: Sharon [name], female    DOB: 01-30-1966, 47 y.o.   MRN: [id number] Chief Complaint  Patient presents with   Follow-up    with rx refills, labs    Blurred Vision    x3-4 mths    Gastrophageal Reflux    on going issues    HPI Sharon [name] is a 47 y.o. female who presents to the [facility] requesting F/U apt. Pt requested to have a complete blood work done. She states she would like this for annual and work purposes. Pt states she needs to provide documentation to work to have insurance decreased. Pt has a family hx of DM. Pt has requested a refill of her clonazepam medication.  Pt complains of having blurred vision. Pt has a hx of retina detachments that occurred about 20 years ago. She states she has had surgery in both eyes. Pt states she will be seeing her ophthalmologist .   Pt also complains of L arm pain. She states her pain begins with numbness, then aching, then prickling feeling from the digits to her elbow. Pt is unsure if the arm pain is due to her not taking her Clonazepam. She states she has a hx of tremors. Pt states she has not seen a neurologist but states she was once scheduled for an apt. Pt states she has not had adequate amount hours of sleep for the past 3 days.  She states her last visit to the OB/GYN was in January. Pt states her abnormality found in her breast was fatty tissue lump.  Pt states she has received her flu shot this year. Pt denies smoking.  Review of Systems  HENT: Negative for congestion and sore throat.   Eyes: Positive for visual disturbance. Negative for photophobia, pain, discharge, redness and itching.  Musculoskeletal:       Arm pain  Neurological: Positive for tremors (hx).  Psychiatric/Behavioral: Positive for sleep disturbance.   Objective:    Physical Exam CONSTITUTIONAL: Well developed/well nourished HEAD: Normocephalic/atraumatic EYES: EOMI/PERRL patient has contact lenses and I could not visualize her discs. ENMT: Mucous membranes moist NECK: supple no meningeal signs SPINE:entire spine nontender CV: S1/S2 noted, no murmurs/rubs/gallops noted LUNGS: Lungs are clear to auscultation bilaterally, no apparent distress ABDOMEN: soft, nontender, no rebound or guarding GU:no cva tenderness NEURO: Pt is awake/alert, moves all extremitiesx4 there is a tremor noted of the upper extremities. Deep tendon reflexes of the upper sherries are 2+ motor strength is symmetrical. Phalanges Tinel's are negative EXTREMITIES: pulses normal, full ROM SKIN: warm, color normal PSYCH: no abnormalities of mood noted [facility] reading (PRIMARY) by  Dr. Cleta Alberts there is a fusion of the capitate and hamate otherwise   normal C-spine films are normal  Results for orders placed in visit on 05/09/13  POCT CBC      Result Value Range   WBC 10.2  4.6 - 10.2 K/uL   Lymph, poc 1.5  0.6 - 3.4   POC LYMPH PERCENT 14.8  10 - 50 %L   MID (cbc) 0.5  0 - 0.9   POC MID % 4.7  0 - 12 %M   POC Granulocyte 8.2 (*) 2 - 6.9   Granulocyte percent 80.5 (*) 37 - 80 %G   RBC 4.69  4.04 - 5.48 M/uL  Hemoglobin 14.2  12.2 - 16.2 g/dL   HCT, POC 16.1  09.6 - 47.9 %   MCV 96.7  80 - 97 fL   MCH, POC 30.3  27 - 31.2 pg   MCHC 31.3 (*) 31.8 - 35.4 g/dL   RDW, POC 04.5     Platelet Count, POC 327  142 - 424 K/uL   MPV 8.9  0 - 99.8 fL  GLUCOSE, POCT (MANUAL RESULT ENTRY)      Result Value Range   POC Glucose 96  70 - 99 mg/dl  POCT GLYCOSYLATED HEMOGLOBIN (HGB A1C)      Result Value Range   Hemoglobin A1C 5.1    POCT URINALYSIS DIPSTICK      Result Value Range   Color, UA yellow     Clarity, UA clear     Glucose, UA neg     Bilirubin, UA neg     Ketones, UA neg     Spec Grav, UA 1.015     Blood, UA small     pH, UA 6.5     Protein, UA neg     Urobilinogen, UA 0.2      Nitrite, UA neg     Leukocytes, UA Negative    POCT UA - MICROSCOPIC ONLY      Result Value Range   WBC, Ur, HPF, POC 0-2     RBC, urine, microscopic 0-5     Bacteria, U Microscopic neg     Mucus, UA small     Epithelial cells, urine per micros 0-1     Crystals, Ur, HPF, POC neg     Casts, Ur, LPF, POC neg     Yeast, UA neg     Triage Vitals:BP 118/76   Pulse 73   Temp(Src) 98.5 F (36.9 C) (Oral)   Resp 18   Ht 5\' 8"  (1.727 m)   Wt 156 lb (70.761 kg)   BMI 23.73 kg/m2   SpO2 99%   LMP 10/11/2010 Assessment & Plan:  I personally performed the services described in this documentation, which was scribed in my presence. The recorded information has been reviewed and is accurate. Meds refilled. Her Klonopin was also refill. Referral made to neurology to evaluate tremor and left arm numbness. I also gave her a prescription for Protonix . I was not able to get an adequate exam. She is referred back to her eye doctor. She does have a history of previous retinal problems.

## 2013-06-19 HISTORY — PX: CATARACT EXTRACTION: SUR2

## 2013-06-27 ENCOUNTER — Ambulatory Visit
Admission: RE | Admit: 2013-06-27 | Discharge: 2013-06-27 | Disposition: A | Discharge status: Home / Self Care | Payer: BC Managed Care – PPO | Source: Ambulatory Visit | Attending: Emergency Medicine | Admitting: Emergency Medicine

## 2013-06-27 DIAGNOSIS — R251 Tremor, unspecified: Secondary | ICD-10-CM

## 2013-06-27 MED ORDER — GADOBENATE DIMEGLUMINE 529 MG/ML IV SOLN
15.0000 mL | Freq: Once | INTRAVENOUS | Status: AC | PRN
  Administered 2013-06-27: 15 mL via INTRAVENOUS

## 2013-06-28 ENCOUNTER — Telehealth: Payer: Self-pay | Admitting: Emergency Medicine

## 2013-06-28 DIAGNOSIS — R251 Tremor, unspecified: Secondary | ICD-10-CM

## 2013-06-28 NOTE — Telephone Encounter (Signed)
Called patient and let her know I do not have a clear explanation for her symptoms. The MRI had some minor changes but nothing to explain the symptoms she is having. If she still has symptoms let me know and I will be sure she has a neurology referral

## 2013-06-29 NOTE — Telephone Encounter (Signed)
Spoke with pt advised the message from Dr Everlene Farrier. She is still having symptoms and would like to proceed with Neurology. I will send this to referrals.

## 2013-06-29 NOTE — Addendum Note (Signed)
Addended by: Jannette Spanner on: 06/29/2013 10:54 AM   Modules accepted: Orders

## 2013-07-08 ENCOUNTER — Ambulatory Visit (INDEPENDENT_AMBULATORY_CARE_PROVIDER_SITE_OTHER): Payer: BC Managed Care – PPO | Admitting: Neurology

## 2013-07-08 ENCOUNTER — Encounter: Payer: Self-pay | Admitting: Neurology

## 2013-07-08 ENCOUNTER — Encounter (INDEPENDENT_AMBULATORY_CARE_PROVIDER_SITE_OTHER): Payer: Self-pay

## 2013-07-08 VITALS — BP 115/74 | HR 65 | Temp 98.7°F | Ht 68.0 in | Wt 163.0 lb

## 2013-07-08 DIAGNOSIS — G252 Other specified forms of tremor: Secondary | ICD-10-CM

## 2013-07-08 DIAGNOSIS — G250 Essential tremor: Secondary | ICD-10-CM

## 2013-07-08 DIAGNOSIS — M542 Cervicalgia: Secondary | ICD-10-CM

## 2013-07-08 DIAGNOSIS — G2581 Restless legs syndrome: Secondary | ICD-10-CM

## 2013-07-08 DIAGNOSIS — R209 Unspecified disturbances of skin sensation: Secondary | ICD-10-CM

## 2013-07-08 DIAGNOSIS — R202 Paresthesia of skin: Secondary | ICD-10-CM

## 2013-07-08 MED ORDER — PRIMIDONE 50 MG PO TABS
ORAL_TABLET | ORAL | Status: DC
Start: 2013-07-08 — End: 2014-01-30

## 2013-07-08 NOTE — Progress Notes (Signed)
Subjective:    Sharon Sharon is a 48 y.o. female.  HPI  Star Age, MD, PhD [facility] [address], [address] [place], [place] [address]  Dear Dr. Everlene Farrier,   I saw your Sharon, Sharon Sharon, upon your kind request in my neurologic clinic today for initial consultation of her tremors and left upper extremity numbness. Sharon Sharon is unaccompanied today. As you know, Sharon Sharon is a very friendly 48 year old right-handed woman with an underlying medical history of reflux disease, hypertension and anxiety as well as retinal detachment in Sharon remote past, who has noted an upper extremity tremor off and on for years, and also complains of left arm pain, which is more recent. She has noted numbness in her left arm as well as pins and needles sensation. She had a brain MRI with and without contrast on 06/27/2013 and a reviewed Sharon test results: No acute or significant finding. No clear explanation for Sharon describe symptoms. Few scattered punctate foci of T2 and FLAIR signal within Sharon frontal white matter likely to be incidental and insignificant. See above discussion. I also personally reviewed Sharon images through Sharon PACS system. She has had a 2 month history of neck pain with tingling in Sharon L arm, which has essentially resolved. Sharon neck pain is improved and she has no weakness. She now has a very intermittent left-sided neck pain radiating to her left arm. And intermittent paresthesias down her left arm and hand. She has noted no muscle wasting and no twitching. She has had bilateral hand tremors for at least 5 years, worsening. It has affected her handwriting. No obvious triggers or alleviating factors are noted. She does recall that both had tremors in their 60s and her brother has some tremors. She has symptoms of RLS and clonazepam helps. She takes 0.5 mg each night and sometimes 0.25 mg during Sharon day, but it makes her sleepy during Sharon day. She has  had LBP and going to a chiropractor helped. Her mother had RLS, her brother has RLS and her MGM had Sx too. No head or neck tremor is reported and no voice tremor. 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 Alcohol does not seem to improve Sharon tremor.   Her Past Medical History Is Significant For: Past Medical History  Diagnosis Date  . Hypertension   . Pulmonary embolism   . DVT (deep venous thrombosis)     left leg  . Endometriosis   . Detached retina   . History of hemorrhoids   . Anal fissure   . Blood clot in vein     left leg  . Anxiety   . Heart murmur     Her Past Surgical History Is Significant For: Past Surgical History  Procedure Laterality Date  . Tubal ligation    . Inner ear surgery    . Vein surgery  04/2010  . Hemorrhoid surgery    . Retinal detachment surgery    . Incontinence surgery    . Endometrial ablation    . Abdominal hysterectomy    . Rectal prolapse repair    . Eye surgery      Her Family History Is Significant For: Family History  Problem Relation Age of Onset  . Emphysema Mother     smoker  . Allergies Mother   . Asthma Mother   .  Heart disease Mother   . Asthma Father   . Heart disease Father   . Diabetes Father   . Asthma Brother   . Lung cancer Maternal Grandfather     was a smoker  . Cancer Maternal Grandfather     lung  . Heart disease Paternal Grandmother   . Heart disease Paternal Grandfather     Her Social History Is Significant For: History   Social History  . Marital Status: Married    Spouse Name: N/A    Number of Children: 1  . Years of Education: N/A   Occupational History  . Radio broadcast assistant    Social History Main Topics  . Smoking status: Never Smoker   . Smokeless tobacco: Never Used  . Alcohol Use: Yes     Comment: 2 drinks per wk  . Drug Use: No     Comment: former maryjuana use- quit in 1995  . Sexual  Activity: None   Other Topics Concern  . None   Social History Narrative  . None    Her Allergies Are:  Allergies  Allergen Reactions  . Dextromethorphan Polistirex Er     Pt states caused "a lump" in her throat, making it difficult to swallow  . Hydrocodone Nausea And Vomiting  . Penicillins     Unknown   :   Her Current Medications Are:  Outpatient Encounter Prescriptions as of 07/08/2013  Medication Sig  . acyclovir (ZOVIRAX) 400 MG tablet Take 1 tablet by mouth daily.  . clonazePAM (KLONOPIN) 0.5 MG tablet TAKE 1/2 TABLET BY MOUTH IN Sharon MORNING AND 1 TABLET AT NIGHT  . nebivolol (BYSTOLIC) 5 MG tablet Take 1 tablet (5 mg total) by mouth daily. Sharon NEEDS OFFICE VISIT FOR ADDITIONAL REFILLS - 2nd NOTICE  . Nitroglycerin (RECTIV) 0.4 % OINT Place 0.4 inches rectally 2 (two) times daily. Apply 1 in of Rectiv intra-anally x 3 weeks.  . NON FORMULARY Diltiazem hcl gel 2 % 4 times daily   . pantoprazole (PROTONIX) 40 MG tablet Take 1 tablet (40 mg total) by mouth daily.  . ranitidine (ZANTAC) 150 MG tablet TAKE 1 TABLET (150 MG TOTAL) BY MOUTH AT BEDTIME.  Marland Kitchen ALPRAZolam (XANAX) 0.5 MG tablet Take 0.5 mg by mouth as needed.    Review of Systems:  Out of a complete 14 point review of systems, all are reviewed and negative with Sharon exception of these symptoms as listed below:   Review of Systems  HENT: Positive for rhinorrhea.   Eyes: Negative.   Respiratory: Negative.   Cardiovascular: Positive for palpitations.  Gastrointestinal: Positive for diarrhea and constipation.       IBS   Endocrine: Negative.   Genitourinary: Negative.   Musculoskeletal: Positive for arthralgias and myalgias.  Skin: Negative.   Allergic/Immunologic: Positive for environmental allergies.  Neurological: Positive for tremors, weakness and numbness.  Hematological: Bruises/bleeds easily.  Psychiatric/Behavioral: Positive for sleep disturbance (snoring, restless leg). Sharon Sharon is  nervous/anxious.     Objective:  Neurologic Exam  Physical Exam Physical Examination:   Filed Vitals:   07/08/13 1342  BP: 115/74  Pulse: 65  Temp: 98.7 F (37.1 C)    General Examination: Sharon Sharon is a very pleasant 48 y.o. female in no acute distress. She appears well-developed and well-nourished and very well groomed.   HEENT: Normocephalic, atraumatic, pupils are equal, round and reactive to light and accommodation. Funduscopic exam is normal with sharp disc margins noted. Extraocular tracking is good without limitation to  gaze excursion or nystagmus noted. Normal smooth pursuit is noted. Hearing is grossly intact. Tympanic membranes are clear bilaterally. Face is symmetric with normal facial animation and normal facial sensation. Speech is clear with no dysarthria noted. There is no hypophonia. There is no lip, neck/head, jaw or voice tremor. Neck is supple with full range of passive and active motion. There are no carotid bruits on auscultation. Oropharynx exam reveals: mild mouth dryness, good dental hygiene and mild airway crowding. Mallampati is class II. Tongue protrudes centrally and palate elevates symmetrically. Tonsils are 1+ in size. She has bilateral intermittent exotropia.   Chest: Clear to auscultation without wheezing, rhonchi or crackles noted.  Heart: S1+S2+0, regular and normal without murmurs, rubs or gallops noted.   Abdomen: Soft, non-tender and non-distended with normal bowel sounds appreciated on auscultation.  Extremities: There is no pitting edema in Sharon distal lower extremities bilaterally. Pedal pulses are intact.  Skin: Warm and dry without trophic changes noted. There are no varicose veins.  Musculoskeletal: exam reveals no obvious joint deformities, tenderness or joint swelling or erythema.   Neurologically:  Mental status: Sharon Sharon is awake, alert and oriented in all 4 spheres. Her memory, attention, language and knowledge are appropriate.  There is no aphasia, agnosia, apraxia or anomia. Speech is clear with normal prosody and enunciation. Thought process is linear. Mood is congruent and affect is normal.  Cranial nerves are as described above under HEENT exam. In addition, shoulder shrug is normal with equal shoulder height noted. Motor exam: Normal bulk, strength and tone is noted. There is no drift, resting tremor or rebound. There is a bilateral upper extremity postural tremor, which is mild in degree. There is a minimal b/l action tremor. Sharon tremor frequency is fairly fast and Sharon amplitude is small. On Archimedes spiral drawing there is minimal tremulousness noted. Handwriting is not showing tremulousness, and is legible. There is no evidence of micrographia.   Romberg is negative. Reflexes are 2+ throughout. Toes are downgoing bilaterally. Fine motor skills are intact with normal finger taps, normal hand movements, normal rapid alternating patting, normal foot taps and normal foot agility.  Cerebellar testing shows no dysmetria or intention tremor on finger to nose testing. Heel to shin is unremarkable bilaterally. There is no truncal or gait ataxia.  Sensory exam is intact to light touch, pinprick, vibration, temperature sense in Sharon upper and lower extremities. She reports some mild tingling down her left forearm and Sharon medial aspect. Gait, station and balance are unremarkable. No veering to one side is noted. No leaning to one side is noted. Posture is age-appropriate and stance is narrow based. No problems turning are noted. She turns en bloc. Tandem walk is difficult for her. Intact toe is noted. Her heel stance is difficult.                 Assessment and Plan:   In summary, Sharon Sharon is a very pleasant 48 y.o.-year old female with an underlying medical history of reflux disease, irritable bowel syndrome, hypertension, anxiety, restless leg syndrome, and a remote history of retinal detachment, who presents with  intermittent neck pain and left arm tingling as well as a longer standing history of bilateral upper extremity tremors. 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 She is advised that her exam is otherwise benign. She has no evidence loss of sensation, or muscle wasting. She has no  evidence of parkinsonism. She also has a history and physical exam consistent with essential tremor.  I had a long chat with Sharon Sharon about my findings and Sharon diagnosis of ET, RLS and degenerative spine disease. She has no concerning findings for myelopathy. I suggested a couple of things today: I would like to pursue cervical spine MRI with and without contrast. I talked to her about her brain MRI in detail today. She is advised to have more blood work done including iron studies because of her RLS symptoms and inflammatory markers. For symptomatic treatment of her tremors suggested a trial of Mysoline starting at 25 mg strength with build up to 100 mg in biweekly increments. I talked her about potential side effects with regards to Mysoline as well. I will see her back in about 3 months. In Sharon interim, we will call her with her blood work results and MRI results. She was in agreement.  Thank you very much for allowing me to participate in Sharon care of this nice Sharon. If I can be of any further assistance to you please do not hesitate to call me at [phone].  Sincerely,   Star Age, MD, PhD

## 2013-07-08 NOTE — Patient Instructions (Addendum)
I think overall you are doing fairly well but I do want to suggest a few things today:  Remember to drink plenty of fluid, eat healthy meals and do not skip any meals. Try to eat protein with a every meal and eat a healthy snack such as fruit or nuts in between meals. Try to keep a regular sleep-wake schedule and try to exercise daily, particularly in the form of walking, 20-30 minutes a day, if you can.   Please remember, that any kind of tremor may be exacerbated by anxiety, anger, nervousness, excitement, dehydration, sleep deprivation, by caffeine, and low blood sugar values or blood sugar fluctuations. Some medications, especially some antidepressants and lithium can cause or exacerbate tremors. Tremors may temporarily calm down her subside with the use of a benzodiazepine such as Valium or related medications and with alcohol. Be aware however that drinking alcohol is not an approved treatment or appropriate treatment for tremor control and long-term use of benzodiazepines such as Valium, lorazepam, alprazolam, or clonazepam can cause habit formation, physical and psychological addiction.  As far as your medications are concerned, I would like to suggest a trial of Mysoline (primidone) 50 mg strength: Take 1/2 pill each bedtime for 2 weeks, then 1 pill each bedtime for 2 weeks, then 1 1/2 pills each bedtime for 2 weeks, then 2 pills each bedtime thereafter. Common side effects reported are: Sleepiness, drowsiness, balance problems, confusion, and GI related symptoms.    As far as diagnostic testing: C spine MRI with and without contrast. Blood work today and we will call you with the test results.   I would like to see you back in 3 months, sooner if we need to. Please call us with any interim questions, concerns, problems, updates or refill requests.  Please also call us for any test results so we can go over those with you on the phone. Richardson Landry is my clinical assistant and will answer any of your  questions and relay your messages to me and also relay most of my messages to you.  Our phone number is [phone]. We also have an after hours call service for urgent matters and there is a physician on-call for urgent questions. For any emergencies you know to call 911 or go to the nearest emergency room.

## 2013-07-09 LAB — RPR: SYPHILIS RPR SCR: NONREACTIVE

## 2013-07-09 LAB — CK: CK TOTAL: 89 U/L (ref 24–173)

## 2013-07-09 LAB — IRON AND TIBC
IRON: 70 ug/dL (ref 35–155)
Iron Saturation: 22 % (ref 15–55)
TIBC: 322 ug/dL (ref 250–450)
UIBC: 252 ug/dL (ref 150–375)

## 2013-07-09 LAB — SEDIMENTATION RATE: Sed Rate: 2 mm/hr (ref 0–32)

## 2013-07-09 LAB — C-REACTIVE PROTEIN: CRP: 0.5 mg/L (ref 0.0–4.9)

## 2013-07-09 LAB — FERRITIN: Ferritin: 51 ng/mL (ref 15–150)

## 2013-07-09 NOTE — Progress Notes (Signed)
Quick Note:  Please call and advise the patient that the recent labs we checked were within normal limits. We checked muscle enzyme, inflammatory and infectious markers, iron studies, all of which were unremarkable. No further action is required on these tests at this time. Please remind patient to keep any upcoming appointments or tests and to call us with any interim questions, concerns, problems or updates. Thanks,  Star Age, MD, PhD    ______

## 2013-07-10 ENCOUNTER — Ambulatory Visit (INDEPENDENT_AMBULATORY_CARE_PROVIDER_SITE_OTHER): Payer: BC Managed Care – PPO

## 2013-07-10 DIAGNOSIS — G252 Other specified forms of tremor: Secondary | ICD-10-CM

## 2013-07-10 DIAGNOSIS — M542 Cervicalgia: Secondary | ICD-10-CM

## 2013-07-10 DIAGNOSIS — R209 Unspecified disturbances of skin sensation: Secondary | ICD-10-CM

## 2013-07-10 DIAGNOSIS — G250 Essential tremor: Secondary | ICD-10-CM

## 2013-07-10 DIAGNOSIS — G2581 Restless legs syndrome: Secondary | ICD-10-CM

## 2013-07-10 DIAGNOSIS — R202 Paresthesia of skin: Secondary | ICD-10-CM

## 2013-07-11 MED ORDER — GADOPENTETATE DIMEGLUMINE 469.01 MG/ML IV SOLN: 15.0000 mL | Freq: Once | INTRAVENOUS | Status: AC | PRN

## 2013-09-25 ENCOUNTER — Other Ambulatory Visit: Payer: Self-pay

## 2013-09-25 DIAGNOSIS — Z1231 Encounter for screening mammogram for malignant neoplasm of breast: Secondary | ICD-10-CM

## 2013-10-14 ENCOUNTER — Encounter (INDEPENDENT_AMBULATORY_CARE_PROVIDER_SITE_OTHER): Payer: Self-pay

## 2013-10-14 ENCOUNTER — Ambulatory Visit
Admission: RE | Admit: 2013-10-14 | Discharge: 2013-10-14 | Disposition: A | Discharge status: Home / Self Care | Payer: BC Managed Care – PPO | Source: Ambulatory Visit

## 2013-10-14 DIAGNOSIS — Z1231 Encounter for screening mammogram for malignant neoplasm of breast: Secondary | ICD-10-CM

## 2013-10-31 ENCOUNTER — Telehealth: Payer: Self-pay | Admitting: Neurology

## 2013-10-31 NOTE — Telephone Encounter (Signed)
Called patient to reschedule appt on 6/4 and the next available is 03/30/14. Pt would like to know what happens with the medicine she was prescribed. States she has yet to be able to see th physician to discuss how it is working for her and that would be 49months on a medication she doesn't feel is being beneficial to her. States she needs to speak to someone about this medication.

## 2013-11-03 NOTE — Telephone Encounter (Signed)
I called pt and offered appt for tomorrow with Dr. Rexene Alberts, pt not able to make it.  See the week after? (although not Wednesday).  Will watch schedule.

## 2013-11-05 ENCOUNTER — Ambulatory Visit (INDEPENDENT_AMBULATORY_CARE_PROVIDER_SITE_OTHER): Payer: BC Managed Care – PPO | Admitting: Family Medicine

## 2013-11-05 VITALS — BP 122/74 | HR 94 | Temp 99.2°F | Resp 17 | Ht 67.0 in | Wt 157.0 lb

## 2013-11-05 DIAGNOSIS — F3289 Other specified depressive episodes: Secondary | ICD-10-CM

## 2013-11-05 DIAGNOSIS — F32A Depression, unspecified: Secondary | ICD-10-CM

## 2013-11-05 DIAGNOSIS — F329 Major depressive disorder, single episode, unspecified: Secondary | ICD-10-CM

## 2013-11-05 DIAGNOSIS — R112 Nausea with vomiting, unspecified: Secondary | ICD-10-CM

## 2013-11-05 DIAGNOSIS — F411 Generalized anxiety disorder: Secondary | ICD-10-CM

## 2013-11-05 LAB — POCT CBC
Granulocyte percent: 82.6 %G — AB (ref 37–80)
HCT, POC: 41.5 % (ref 37.7–47.9)
HEMOGLOBIN: 14.1 g/dL (ref 12.2–16.2)
Lymph, poc: 1.4 (ref 0.6–3.4)
MCH: 31.3 pg — AB (ref 27–31.2)
MCHC: 34 g/dL (ref 31.8–35.4)
MCV: 92 fL (ref 80–97)
MID (cbc): 0.4 (ref 0–0.9)
MPV: 9.4 fL (ref 0–99.8)
POC Granulocyte: 8.5 — AB (ref 2–6.9)
POC LYMPH PERCENT: 14 %L (ref 10–50)
POC MID %: 3.4 % (ref 0–12)
Platelet Count, POC: 357 10*3/uL (ref 142–424)
RBC: 4.51 M/uL (ref 4.04–5.48)
RDW, POC: 13.6 %
WBC: 10.3 10*3/uL — AB (ref 4.6–10.2)

## 2013-11-05 MED ORDER — LORAZEPAM 1 MG PO TABS: 1.0000 mg | ORAL_TABLET | Freq: Three times a day (TID) | ORAL | Status: DC

## 2013-11-05 MED ORDER — VENLAFAXINE HCL 75 MG PO TABS: 75.0000 mg | ORAL_TABLET | Freq: Every day | ORAL | Status: DC

## 2013-11-05 NOTE — Progress Notes (Signed)
[facility] [address], [place] [address] [phone]  Date:  11/05/2013   Name:  Sharon [name]   DOB:  1966/04/06   MRN:  [id number]  PCP:  Lamar Blinks, MD    Chief Complaint: Anxiety and Depression   History of Present Illness:  Sharon [name] is a 48 y.o. very pleasant female patient who presents with the following:  Here today with severe anxiety which is causing "dry heaves, I can't keep anything down.  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  She is taking her to court actually tomorrow.  Also, her live in BF is apparently "planning to run off with another man's wife."  The other husband hired a [employer] and obtained pictures proving the infidelity.  She has been with this BF since October of last year.  Apparently the other women lives in another state.   S/p hysterectomy.  She has a history of HTN, tremor, GERD.    She was given klonopin last in November.  However she notes that this "just makes me feel sleepy."  She notes "dry heaves" anytime she cries.  She does not have abdominal pain.    Things have been bad at work for about 3 years, trouble with the tennant for 3 months and just recently with her BF.  Also, her BF has HIV- she just recently learned this information.  She was tested for HIV by her OB last wek  Patient Active Problem List   Diagnosis Date Noted  . Chest pain 05/09/2011  . Anal fissure 05/05/2011  . Cough 10/25/2010  . Dyspnea 10/13/2010  . Hypertension 10/13/2010    Past Medical History  Diagnosis Date  . Hypertension   . Pulmonary embolism   . DVT (deep venous thrombosis)     left leg  . Endometriosis   . Detached retina   . History of hemorrhoids   . Anal fissure   . Blood clot in  vein     left leg  . Anxiety   . Heart murmur     Past Surgical History  Procedure Laterality Date  . Tubal ligation    . Inner ear surgery    . Vein surgery  04/2010  . Hemorrhoid surgery    . Retinal detachment surgery    . Incontinence surgery    . Endometrial ablation    . Abdominal hysterectomy    . Rectal prolapse repair    . Eye surgery      History  Substance Use Topics  . Smoking status: Never Smoker   . Smokeless tobacco: Never Used  . Alcohol Use: Yes     Comment: 2 drinks per wk    Family History  Problem Relation Age of Onset  . Emphysema Mother     smoker  . Allergies Mother   . Asthma Mother   . Heart disease Mother   . Asthma Father   . Heart disease Father   . Diabetes Father   . Asthma Brother   . Lung cancer Maternal Grandfather     was a smoker  . Cancer Maternal Grandfather     lung  . Heart disease Paternal Grandmother   .  Heart disease Paternal Grandfather     Allergies  Allergen Reactions  . Dextromethorphan Polistirex Er     Pt states caused "a lump" in her throat, making it difficult to swallow  . Hydrocodone Nausea And Vomiting  . Penicillins     Unknown     Medication list has been reviewed and updated.  Current Outpatient Prescriptions on File Prior to Visit  Medication Sig Dispense Refill  . acyclovir (ZOVIRAX) 400 MG tablet Take 1 tablet by mouth daily.      . clonazePAM (KLONOPIN) 0.5 MG tablet TAKE 1/2 TABLET BY MOUTH IN THE MORNING AND 1 TABLET AT NIGHT  45 tablet  5  . nebivolol (BYSTOLIC) 5 MG tablet Take 1 tablet (5 mg total) by mouth daily. PATIENT NEEDS OFFICE VISIT FOR ADDITIONAL REFILLS - 2nd NOTICE  30 tablet  11  . Nitroglycerin (RECTIV) 0.4 % OINT Place 0.4 inches rectally 2 (two) times daily. Apply 1 in of Rectiv intra-anally x 3 weeks.  1 Tube  3  . NON FORMULARY Diltiazem hcl gel 2 % 4 times daily       . pantoprazole (PROTONIX) 40 MG tablet Take 1 tablet (40 mg total) by mouth daily.  30 tablet  11  .  primidone (MYSOLINE) 50 MG tablet 1/2 pill each bedtime x 2 weeks, then 1 pill nightly x 2 weeks, then 1 1/2 pills nightly x 2 weeks, then 2 pills nightly thereafter.  60 tablet  5  . ranitidine (ZANTAC) 150 MG tablet TAKE 1 TABLET (150 MG TOTAL) BY MOUTH AT BEDTIME.  30 tablet  0   No current facility-administered medications on file prior to visit.    Review of Systems:  As per HPI- otherwise negative.   Physical Examination: Filed Vitals:   11/05/13 1053  BP: 122/74  Pulse: 94  Temp: 99.2 F (37.3 C)  Resp: 17   Filed Vitals:   11/05/13 1053  Height: 5\' 7"  (1.702 m)  Weight: 157 lb (71.215 kg)   Body mass index is 24.58 kg/(m^2). Ideal Body Weight: Weight in (lb) to have BMI = 25: 159.3  GEN: WDWN, NAD, Non-toxic, A & O x 3, tearful.   HEENT: Atraumatic, Normocephalic. Neck supple. No masses, No LAD. Ears and Nose: No external deformity. CV: RRR, No M/G/R. No JVD. No thrill. No extra heart sounds. PULM: CTA B, no wheezes, crackles, rhonchi. No retractions. No resp. distress. No accessory muscle use. ABD: S, NT, ND, +BS. No rebound. No HSM.  Benign abdomen EXTR: No c/c/e NEURO Normal gait.  PSYCH: Normally interactive. Conversant.   Results for orders placed in visit on 11/05/13  POCT CBC      Result Value Ref Range   WBC 10.3 (*) 4.6 - 10.2 K/uL   Lymph, poc 1.4  0.6 - 3.4   POC LYMPH PERCENT 14.0  10 - 50 %L   MID (cbc) 0.4  0 - 0.9   POC MID % 3.4  0 - 12 %M   POC Granulocyte 8.5 (*) 2 - 6.9   Granulocyte percent 82.6 (*) 37 - 80 %G   RBC 4.51  4.04 - 5.48 M/uL   Hemoglobin 14.1  12.2 - 16.2 g/dL   HCT, POC 41.5  37.7 - 47.9 %   MCV 92.0  80 - 97 fL   MCH, POC 31.3 (*) 27 - 31.2 pg   MCHC 34.0  31.8 - 35.4 g/dL   RDW, POC 13.6     Platelet Count, POC  357  142 - 424 K/uL   MPV 9.4  0 - 99.8 fL    Assessment and Plan: Anxiety state, unspecified - Plan: LORazepam (ATIVAN) 1 MG tablet  Depression - Plan: Comprehensive metabolic panel, TSH, venlafaxine  (EFFEXOR) 75 MG tablet  Nausea with vomiting - Plan: POCT CBC  Here today with acute anxiety and depression.  Discussed SI- she states she has no plans for self harm.  "If I wanted to do that I wouldn't have come here!"  She feels that klonopin is not very helpful in this regard; will try ativan as needed, as well as effexor 75 mg a day.  She does have sx of nausea and "dry heaves.'  However she really feels that this is due to her anxiety and not to an abdominal problem.  Cautioned her to seek care right away if this is getting worse or not better soon.  Her WBC count is slightly elevated but this is non- specific and her abdominal exam is benign  Will plan further follow- up pending labs. See patient instructions for more details.     Signed Lamar Blinks, MD

## 2013-11-05 NOTE — Patient Instructions (Addendum)
I think that counseling is a great idea for you. Please follow through with this asap Use the ativan as needed for acute anxiety. Remember it can make you feel drowsy so be very cautious regarding driving.  The combination of ativan and mysoline can make you more drowsy Start the effexor 75 mg a day for anxiety.   Please call, email or come and see me in 2 or 3 weeks to check on your progress.

## 2013-11-06 ENCOUNTER — Encounter: Payer: Self-pay | Admitting: Family Medicine

## 2013-11-06 LAB — COMPREHENSIVE METABOLIC PANEL
ALT: 9 U/L (ref 0–35)
AST: 11 U/L (ref 0–37)
Albumin: 4.8 g/dL (ref 3.5–5.2)
Alkaline Phosphatase: 57 U/L (ref 39–117)
BUN: 15 mg/dL (ref 6–23)
CALCIUM: 10.1 mg/dL (ref 8.4–10.5)
CO2: 25 meq/L (ref 19–32)
CREATININE: 0.7 mg/dL (ref 0.50–1.10)
Chloride: 99 mEq/L (ref 96–112)
Glucose, Bld: 90 mg/dL (ref 70–99)
Potassium: 4.3 mEq/L (ref 3.5–5.3)
SODIUM: 136 meq/L (ref 135–145)
TOTAL PROTEIN: 7.9 g/dL (ref 6.0–8.3)
Total Bilirubin: 0.9 mg/dL (ref 0.2–1.2)

## 2013-11-06 LAB — TSH: TSH: 1.011 u[IU]/mL (ref 0.350–4.500)

## 2013-11-08 ENCOUNTER — Telehealth: Payer: Self-pay

## 2013-11-08 NOTE — Telephone Encounter (Signed)
Tried again - still no answer

## 2013-11-08 NOTE — Telephone Encounter (Signed)
PATIENT STATES SHE WAS PRESCRIBED TWO MEDICATIONS FOR DEPRESSION BY DR COPLAND. PATIENT SAYS SHE TOOK  THE LORazepam WHEN SHE LEFT COURT, SHE WAS FINE UNTIL SHE GOT HOME AND TOOK THE venlafaxine. SHE SAYS AFTER SHE TOOK venlafaxine SHE COULD NOT MOVE AND SHE WAS STUCK IN BED FOR THE REST OF THE DAY. SHE SAYS SHE FELT SHE WAS "BETWEEN LIFE AND DEATH". PATIENT DOES NOT KNOW IF THIS IS NORMAL OR IF SHE IS HAVING A BAD REACTION TO THE MEDICATION.  SHE SAYS DR COPLAND TOLD HER TO CALL IF SHE HAS ANY QUESTIONS.  ADVISED PATIENT IF SHE FEELS THAT SHE IS HAVING A REACTION IT IS ALWAYS BEST TO COME BACK TO THE OFFICE TO BE SEEN. PATIENT SAYS SHE WOULD WAIT TO HEAR SOMETHING BACK FROM CLINICAL.

## 2013-11-08 NOTE — Telephone Encounter (Signed)
Called- no answer but left detailed message.  It is unusual to have such a strong reaction, but she did the right thing by stopping the medication.  However wonder if something else could be going on to give her such strong symptoms- would encourage her to come in for a recheck and we can certainly work with her as far as cost.

## 2013-11-08 NOTE — Telephone Encounter (Signed)
Tried again at 7:30- no answer

## 2013-11-08 NOTE — Telephone Encounter (Signed)
Pt stopped taking the venlafaxine immediately. She has returned to taking her original medication, Klonopin. She does not have the funds to RTC. The venlafaxine is too powerful for her. She will try to come in as soon as possible. Her appetite is low and she has not been able to drive herself. She does not have much energy or motivation right now.  Dr Lorelei Pont please call if you can.

## 2013-11-09 NOTE — Telephone Encounter (Signed)
Tried again on 5/24 at 10:30 am.  No answer, left another message; if she still needs help please come and see [place] asap

## 2013-11-10 NOTE — Telephone Encounter (Signed)
Patient returned Dr. Lillie Fragmin phone call

## 2013-11-12 ENCOUNTER — Ambulatory Visit (INDEPENDENT_AMBULATORY_CARE_PROVIDER_SITE_OTHER): Payer: BC Managed Care – PPO | Admitting: Family Medicine

## 2013-11-12 VITALS — BP 118/76 | HR 77 | Temp 98.4°F | Resp 18 | Ht 68.0 in | Wt 156.0 lb

## 2013-11-12 DIAGNOSIS — F411 Generalized anxiety disorder: Secondary | ICD-10-CM

## 2013-11-12 MED ORDER — ALPRAZOLAM 0.5 MG PO TABS: ORAL_TABLET | ORAL | Status: DC

## 2013-11-12 NOTE — Progress Notes (Signed)
[facility] [address], [place] [address] [phone]  Date:  11/12/2013   Name:  Sharon [name]   DOB:  Apr 04, 1966   MRN:  [id number]  PCP:  Lamar Blinks, MD    Chief Complaint: Follow-up   History of Present Illness:  Sharon [name] is a 48 y.o. very pleasant female patient who presents with the following:  Here today to recheck her anxiety.  She was here about one week ago with anxiety about several life issues.  We tried ativan and effexor.  However she then called after taking the effexor to report that it may have caused an adverse reaction.    Today is Sharon. She went to court on Friday in order to evict a tenant.  She did win the case and the tenant is being evicted.  She is supposed to be out by the end of this week, but Freda Munro is still worried that she will not be out on time as promised.  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  She was very upset after court and took a 1/2 ativan pill which seemed to help.  She then took her first dose of effexor; "I don't know what happened after that.  For that whole day I could not get out of bed." She continued to feel very strange for a couple of days, now is back to normal.   She is no longer taking effexor, and also stopped taking the ativan.  She had used klonopin in the past, and also has used xanax in the past prior to medical procedures/ operations in the past.  She is able to eat again, although her appetite is not that good.  She still does not feel that she can return to work especially as this woman is also at her work.    She has many examples including videos and audio on her phone that she is collecting, showing how her tenant is "torturing" her.  Thinks she may need to just find a new job.  Her depression and other physical sx of vomiting are improved.  She feels that she is now having discrete panic attacks and would like some xanax.      Patient Active Problem  List   Diagnosis Date Noted  . Chest pain 05/09/2011  . Anal fissure 05/05/2011  . Cough 10/25/2010  . Dyspnea 10/13/2010  . Hypertension 10/13/2010    Past Medical History  Diagnosis Date  . Hypertension   . Pulmonary embolism   . DVT (deep venous thrombosis)     left leg  . Endometriosis   . Detached retina   . History of hemorrhoids   . Anal fissure   . Blood clot in vein     left leg  . Anxiety   . Heart murmur     Past Surgical History  Procedure Laterality Date  . Tubal ligation    . Inner ear surgery    . Vein surgery  04/2010  . Hemorrhoid surgery    . Retinal detachment surgery    . Incontinence surgery    . Endometrial ablation    . Abdominal hysterectomy    . Rectal prolapse repair    . Eye surgery      History  Substance Use Topics  . Smoking status: Never Smoker   . Smokeless tobacco: Never Used  . Alcohol Use: Yes     Comment: 2 drinks per wk  Family History  Problem Relation Age of Onset  . Emphysema Mother     smoker  . Allergies Mother   . Asthma Mother   . Heart disease Mother   . Asthma Father   . Heart disease Father   . Diabetes Father   . Asthma Brother   . Lung cancer Maternal Grandfather     was a smoker  . Cancer Maternal Grandfather     lung  . Heart disease Paternal Grandmother   . Heart disease Paternal Grandfather     Allergies  Allergen Reactions  . Dextromethorphan Polistirex Er     Pt states caused "a lump" in her throat, making it difficult to swallow  . Hydrocodone Nausea And Vomiting  . Penicillins     Unknown     Medication list has been reviewed and updated.  Current Outpatient Prescriptions on File Prior to Visit  Medication Sig Dispense Refill  . acyclovir (ZOVIRAX) 400 MG tablet Take 1 tablet by mouth daily.      . nebivolol (BYSTOLIC) 5 MG tablet Take 1 tablet (5 mg total) by mouth daily. PATIENT NEEDS OFFICE VISIT FOR ADDITIONAL REFILLS - 2nd NOTICE  30 tablet  11  . Nitroglycerin (RECTIV) 0.4  % OINT Place 0.4 inches rectally 2 (two) times daily. Apply 1 in of Rectiv intra-anally x 3 weeks.  1 Tube  3  . NON FORMULARY Diltiazem hcl gel 2 % 4 times daily       . pantoprazole (PROTONIX) 40 MG tablet Take 1 tablet (40 mg total) by mouth daily.  30 tablet  11  . primidone (MYSOLINE) 50 MG tablet 1/2 pill each bedtime x 2 weeks, then 1 pill nightly x 2 weeks, then 1 1/2 pills nightly x 2 weeks, then 2 pills nightly thereafter.  60 tablet  5  . ranitidine (ZANTAC) 150 MG tablet TAKE 1 TABLET (150 MG TOTAL) BY MOUTH AT BEDTIME.  30 tablet  0  . LORazepam (ATIVAN) 1 MG tablet Take 1 tablet (1 mg total) by mouth every 8 (eight) hours. Take 1/2 or 1 three times a day as needed for anxiety  45 tablet  0   No current facility-administered medications on file prior to visit.    Review of Systems:  As per HPI- otherwise negative.   Physical Examination: Filed Vitals:   11/12/13 1141  BP: 118/76  Pulse: 77  Temp: 98.4 F (36.9 C)  Resp: 18   Filed Vitals:   11/12/13 1141  Height: 5\' 8"  (1.727 m)  Weight: 156 lb (70.761 kg)   Body mass index is 23.73 kg/(m^2). Ideal Body Weight: Weight in (lb) to have BMI = 25: 164.1  GEN: WDWN, NAD, Non-toxic, A & O x 3, looks much better and calmer today. Not vomiting.   HEENT: Atraumatic, Normocephalic. Neck supple. No masses, No LAD. Ears and Nose: No external deformity. CV: RRR, No M/G/R. No JVD. No thrill. No extra heart sounds. PULM: CTA B, no wheezes, crackles, rhonchi. No retractions. No resp. distress. No accessory muscle use. ABD: S, NT, ND EXTR: No c/c/e NEURO Normal gait.  PSYCH: Normally interactive. Conversant. Not depressed or anxious appearing.  Calm demeanor.    Assessment and Plan: Generalized anxiety disorder - Plan: ALPRAZolam (XANAX) 0.5 MG tablet  She may use xanax as needed for anxiety- warned against overuse and possible dependence.  I did give her a note to be out for the rest of this week though her vacation next  week.  However explained that I cannot give her FMLA in order to allow her time to job search.  She understands and will see me next week prior to RTW  She plans to use xanax as needed during the day and may still take a klonopin at night as needed.    Meds ordered this encounter  Medications  . ALPRAZolam (XANAX) 0.5 MG tablet    Sig: Take 1/2 or 1 every 8 hours as needed for anxiety    Dispense:  40 tablet    Refill:  0     Signed Lamar Blinks, MD

## 2013-11-12 NOTE — Telephone Encounter (Signed)
I called pt and offered her tomorrow appt with Dr. Rexene Alberts.  Pt was not able to take,  (she would call back).

## 2013-11-12 NOTE — Telephone Encounter (Signed)
Pt is currently in waiting room

## 2013-11-12 NOTE — Patient Instructions (Signed)
Try using the xanax instead of ativan or klonopin (in any case take just one of this type of medication, not more than one at once).  Remember xanax can be more habit forming and you do need to be careful of escalating use.  Please come and see me in about 10 days for a recheck- Sooner if worse.

## 2013-11-20 ENCOUNTER — Ambulatory Visit: Payer: BC Managed Care – PPO | Admitting: Neurology

## 2013-12-07 ENCOUNTER — Other Ambulatory Visit: Payer: Self-pay | Admitting: Emergency Medicine

## 2013-12-13 ENCOUNTER — Other Ambulatory Visit: Payer: Self-pay | Admitting: Family Medicine

## 2013-12-13 ENCOUNTER — Other Ambulatory Visit: Payer: Self-pay | Admitting: Emergency Medicine

## 2013-12-13 ENCOUNTER — Telehealth: Payer: Self-pay

## 2013-12-13 DIAGNOSIS — F411 Generalized anxiety disorder: Secondary | ICD-10-CM

## 2013-12-13 NOTE — Telephone Encounter (Signed)
DR.COPLAND, PT STATES THAT HER PHARMACY INFORMED HER THAT SHE WOULD NEED TO HAVE AN OV FOR KLONOPIN, SHE STATES THAT SHE WAS JUST HERE NOT LONG AGO AND [place] NEED AN OV. SHE ALSO WANTED TO LET YOU KNOW THAT SHE HAS NOW LOST HER JOB BUT SHE IS HANDLING IT WELL. [phone]

## 2013-12-14 MED ORDER — ALPRAZOLAM 0.5 MG PO TABS: ORAL_TABLET | ORAL | Status: DC

## 2013-12-14 NOTE — Telephone Encounter (Signed)
I saw her on 5/27 to follow-up anxiety.  Called pharmacy to clarify her recent benzo use, received the following information  5/9: klonpin #45 5/20: ativan #45 5/28: xanax #40  Called her- she states that she did lose her job.  She is currently looking for a new job and feels that she is doing ok.   She is taking xanax 1/2 or 1 tablet during the day and 1 klonopin at night.  She states that she is not using ativan because "it makes me sick."  She would like to refill her 2 medications prior to her insurance running out in a couple of weeks.   She is due to refill klonopin, so will do this for her.  However her xanax is not yet due, so will allow her to fill this on 7/8.  She understands and agrees

## 2013-12-27 ENCOUNTER — Telehealth: Payer: Self-pay

## 2013-12-27 NOTE — Telephone Encounter (Signed)
Patient requesting another refill of her "Clonazepam" to get her to the end of August. Per patient she picked up a refill on June 28 which will get her to the end of July. Patient stated her insurance will cancel on July 17th and will not have any way to pay for the medication. Per patient she is looking for a new job and hopes to have one to cover her future medications after August. Patient request refill to [facility] [place]. Patients call back number is [phone]

## 2013-12-29 NOTE — Telephone Encounter (Signed)
Called her back.  Her insurance will not pay for an early refill so there is no need to try and refill this early; she would have to pay out of pocket anyway.  She states understanding.  Encouraged her to call around and find the best price on her medication- perhaps [facility] or [facility]

## 2014-01-04 ENCOUNTER — Other Ambulatory Visit: Payer: Self-pay | Admitting: Family Medicine

## 2014-01-04 DIAGNOSIS — F411 Generalized anxiety disorder: Secondary | ICD-10-CM

## 2014-01-05 NOTE — Telephone Encounter (Signed)
Called pharmD- she last picked this upon 6/28. Authorized RF but not to fill prior to 7/25

## 2014-01-30 ENCOUNTER — Other Ambulatory Visit: Payer: Self-pay | Admitting: Neurology

## 2014-03-25 ENCOUNTER — Telehealth: Payer: Self-pay | Admitting: *Deleted

## 2014-03-25 NOTE — Telephone Encounter (Signed)
Patient calling to cancel appointment on 03/30/14 with Dr Rexene Alberts, patient will r/s appointment after new insurance has started.

## 2014-03-30 ENCOUNTER — Ambulatory Visit: Payer: BC Managed Care – PPO | Admitting: Neurology

## 2014-04-09 ENCOUNTER — Other Ambulatory Visit: Payer: Self-pay | Admitting: Neurology

## 2014-04-14 ENCOUNTER — Other Ambulatory Visit: Payer: Self-pay | Admitting: Family Medicine

## 2014-04-14 DIAGNOSIS — F411 Generalized anxiety disorder: Secondary | ICD-10-CM

## 2014-05-16 ENCOUNTER — Other Ambulatory Visit: Payer: Self-pay | Admitting: Family Medicine

## 2014-05-18 NOTE — Telephone Encounter (Signed)
Could one of you please call her and remind her that she is due for a recheck- I last saw her in May.  She may have #15 kloponin until she can get in.  Thank you!!

## 2014-05-19 NOTE — Telephone Encounter (Signed)
Called in Rx and notified pt needs OV. Pt stated that she normally only comes in once a year but agreed.

## 2014-05-20 ENCOUNTER — Other Ambulatory Visit: Payer: Self-pay | Admitting: Family Medicine

## 2014-05-30 ENCOUNTER — Ambulatory Visit (INDEPENDENT_AMBULATORY_CARE_PROVIDER_SITE_OTHER): Payer: PRIVATE HEALTH INSURANCE | Admitting: Family Medicine

## 2014-05-30 ENCOUNTER — Ambulatory Visit (INDEPENDENT_AMBULATORY_CARE_PROVIDER_SITE_OTHER): Payer: PRIVATE HEALTH INSURANCE

## 2014-05-30 VITALS — BP 120/84 | HR 78 | Temp 99.2°F | Resp 18 | Ht 68.0 in | Wt 151.0 lb

## 2014-05-30 DIAGNOSIS — R0683 Snoring: Secondary | ICD-10-CM

## 2014-05-30 DIAGNOSIS — S338XXA Sprain of other parts of lumbar spine and pelvis, initial encounter: Secondary | ICD-10-CM

## 2014-05-30 DIAGNOSIS — F411 Generalized anxiety disorder: Secondary | ICD-10-CM

## 2014-05-30 DIAGNOSIS — F4322 Adjustment disorder with anxiety: Secondary | ICD-10-CM

## 2014-05-30 DIAGNOSIS — R5382 Chronic fatigue, unspecified: Secondary | ICD-10-CM

## 2014-05-30 DIAGNOSIS — I10 Essential (primary) hypertension: Secondary | ICD-10-CM

## 2014-05-30 DIAGNOSIS — Z114 Encounter for screening for human immunodeficiency virus [HIV]: Secondary | ICD-10-CM

## 2014-05-30 DIAGNOSIS — R109 Unspecified abdominal pain: Secondary | ICD-10-CM

## 2014-05-30 DIAGNOSIS — H8110 Benign paroxysmal vertigo, unspecified ear: Secondary | ICD-10-CM

## 2014-05-30 DIAGNOSIS — K219 Gastro-esophageal reflux disease without esophagitis: Secondary | ICD-10-CM

## 2014-05-30 DIAGNOSIS — Z833 Family history of diabetes mellitus: Secondary | ICD-10-CM

## 2014-05-30 DIAGNOSIS — R0981 Nasal congestion: Secondary | ICD-10-CM

## 2014-05-30 LAB — CBC
HCT: 39.1 % (ref 36.0–46.0)
HEMOGLOBIN: 13.6 g/dL (ref 12.0–15.0)
MCH: 30.2 pg (ref 26.0–34.0)
MCHC: 34.8 g/dL (ref 30.0–36.0)
MCV: 86.7 fL (ref 78.0–100.0)
MPV: 10.3 fL (ref 9.4–12.4)
Platelets: 319 10*3/uL (ref 150–400)
RBC: 4.51 MIL/uL (ref 3.87–5.11)
RDW: 12.9 % (ref 11.5–15.5)
WBC: 6.5 10*3/uL (ref 4.0–10.5)

## 2014-05-30 LAB — COMPREHENSIVE METABOLIC PANEL
ALK PHOS: 58 U/L (ref 39–117)
ALT: 8 U/L (ref 0–35)
AST: 9 U/L (ref 0–37)
Albumin: 4.4 g/dL (ref 3.5–5.2)
BILIRUBIN TOTAL: 0.5 mg/dL (ref 0.2–1.2)
BUN: 11 mg/dL (ref 6–23)
CO2: 28 mEq/L (ref 19–32)
Calcium: 9.7 mg/dL (ref 8.4–10.5)
Chloride: 101 mEq/L (ref 96–112)
Creat: 0.68 mg/dL (ref 0.50–1.10)
GLUCOSE: 77 mg/dL (ref 70–99)
Potassium: 4.2 mEq/L (ref 3.5–5.3)
Sodium: 139 mEq/L (ref 135–145)
Total Protein: 7.1 g/dL (ref 6.0–8.3)

## 2014-05-30 LAB — HEMOGLOBIN A1C
Hgb A1c MFr Bld: 5.3 % (ref <5.7)
MEAN PLASMA GLUCOSE: 105 mg/dL (ref <117)

## 2014-05-30 LAB — TSH: TSH: 1.395 u[IU]/mL (ref 0.350–4.500)

## 2014-05-30 LAB — HIV ANTIBODY (ROUTINE TESTING W REFLEX): HIV 1&2 Ab, 4th Generation: NONREACTIVE

## 2014-05-30 MED ORDER — CLONAZEPAM 0.5 MG PO TABS: ORAL_TABLET | ORAL | Status: DC

## 2014-05-30 MED ORDER — RANITIDINE HCL 150 MG PO TABS: ORAL_TABLET | ORAL | Status: DC

## 2014-05-30 MED ORDER — PANTOPRAZOLE SODIUM 40 MG PO TBEC: 40.0000 mg | DELAYED_RELEASE_TABLET | Freq: Every day | ORAL | Status: DC

## 2014-05-30 MED ORDER — NEBIVOLOL HCL 5 MG PO TABS: 5.0000 mg | ORAL_TABLET | Freq: Every day | ORAL | Status: DC

## 2014-05-30 NOTE — Patient Instructions (Signed)
I will be in touch with your labs.  Let me know if you change your mind about seeing ENT

## 2014-05-30 NOTE — Progress Notes (Signed)
[facility] [address], [place] [address] [phone]  Date:  05/30/2014   Name:  Sharon [name]   DOB:  [birth year]   MRN:  [id number]  PCP:  Lamar Blinks, MD    Chief Complaint: rx refills   History of Present Illness:  Sharon [name] is a 48 y.o. very pleasant female patient who presents with the following:  Here today for a medication refill and several other concerns as below.   She uses protonix in the evening and 2 of the zantac in the am.  Her "breathing issues turned out to be acid reflux."   She takes klonopin at bedtime and sometimes will take a 1/2 during the day as well- she is no longer taking xanax.   She is also having "some problems with vertigo."  She tends to have it more during the night.  She has noted this for about one month.    She will also have it with standing up fast or moving her head- it resolves quickly if she holds her head still.  No vomiting.  No unusual headache.  She has had this in the past and it resolved She also notes that she has been using OTC nasal decongestant sprays off and on for years- when she tries to stop taking this she will tend to snore at night. Her BF has not noted any apnea but is bothered by snoring if she dos not use her nasal spray.  Her anxiety attacks are at least better but not gone.  She got a new job and this seems to be helping with her anxiety  She also notes that she has right side pains that "can last for 2 or 3 days and will leave me doubled over."  This has been intermittent for months but has not happened in the last couple of weeks.  She states that "it hurt so bad at work that they thought I was having appendicitis" but she did not seek care for this.  She would like to have an HIV test and also have her glucose checked Wonders if she should be taking some sort of vitamin but vitamins do tend to to cause constipation and also can irritate her stomach.  She also notes constipation  intermitently and sometimes has to manually disimpact herself- this is worse when she takes vitamins. "But don't I need to be taking vitamins?  I am so tired all the time.  Why am I so tired all the time?  I come home and just want to eat and go to bed at 7:30."   She also notes pain in her sacrum which has been present for "a year and a half. It is there 24 seven."  States that her BF has the press on the area every night in order for her to go to sleep.   It seems that pressing on the area gets rid of the pain.    States that her mother "had fibromyagia, scoliosis."    Patient Active Problem List   Diagnosis Date Noted  . Chest pain 05/09/2011  . Anal fissure 05/05/2011  . Cough 10/25/2010  . Dyspnea 10/13/2010  . Hypertension 10/13/2010    Past Medical History  Diagnosis Date  . Hypertension   . Pulmonary embolism   . DVT (deep venous thrombosis)     left leg  . Endometriosis   . Detached retina   . History of hemorrhoids   . Anal fissure   .  Blood clot in vein     left leg  . Anxiety   . Heart murmur     Past Surgical History  Procedure Laterality Date  . Tubal ligation    . Inner ear surgery    . Vein surgery  04/2010  . Hemorrhoid surgery    . Retinal detachment surgery    . Incontinence surgery    . Endometrial ablation    . Abdominal hysterectomy    . Rectal prolapse repair    . Eye surgery      History  Substance Use Topics  . Smoking status: Never Smoker   . Smokeless tobacco: Never Used  . Alcohol Use: Yes     Comment: 2 drinks per wk    Family History  Problem Relation Age of Onset  . Emphysema Mother     smoker  . Allergies Mother   . Asthma Mother   . Heart disease Mother   . Asthma Father   . Heart disease Father   . Diabetes Father   . Asthma Brother   . Lung cancer Maternal Grandfather     was a smoker  . Cancer Maternal Grandfather     lung  . Heart disease Paternal Grandmother   . Heart disease Paternal Grandfather      Allergies  Allergen Reactions  . Dextromethorphan Polistirex Er     Pt states caused "a lump" in her throat, making it difficult to swallow  . Hydrocodone Nausea And Vomiting  . Penicillins     Unknown     Medication list has been reviewed and updated.  Current Outpatient Prescriptions on File Prior to Visit  Medication Sig Dispense Refill  . acyclovir (ZOVIRAX) 400 MG tablet Take 1 tablet by mouth daily.    . clonazePAM (KLONOPIN) 0.5 MG tablet TAKE 1 TABLET BY MOUTH AT BEDTIME AS NEEDED 15 tablet 0  . nebivolol (BYSTOLIC) 5 MG tablet Take 1 tablet (5 mg total) by mouth daily. PATIENT NEEDS BLOOD PRESSURE CHECK UP FOR ADDITIONAL REFILLS 30 tablet 0  . pantoprazole (PROTONIX) 40 MG tablet Take 1 tablet (40 mg total) by mouth daily. 30 tablet 11  . primidone (MYSOLINE) 50 MG tablet TAKE 2 TABLETS (100 MG TOTAL) BY MOUTH AT BEDTIME. 60 tablet 1  . ranitidine (ZANTAC) 150 MG tablet TAKE 1 TABLET (150 MG TOTAL) BY MOUTH AT BEDTIME. 30 tablet 0  . ALPRAZolam (XANAX) 0.5 MG tablet Take 1/2 or 1 daily as needed. Ok to fill on 12/24/2013 (Patient not taking: Reported on 05/30/2014) 30 tablet 0  . Nitroglycerin (RECTIV) 0.4 % OINT Place 0.4 inches rectally 2 (two) times daily. Apply 1 in of Rectiv intra-anally x 3 weeks. (Patient not taking: Reported on 05/30/2014) 1 Tube 3  . NON FORMULARY Diltiazem hcl gel 2 % 4 times daily      No current facility-administered medications on file prior to visit.    Review of Systems:  As per HPI- otherwise negative.   Physical Examination: Filed Vitals:   05/30/14 1020  BP: 120/84  Pulse: 78  Temp: 99.2 F (37.3 C)  Resp: 18   Filed Vitals:   05/30/14 1020  Height: 5\' 8"  (1.727 m)  Weight: 151 lb (68.493 kg)   Body mass index is 22.96 kg/(m^2). Ideal Body Weight: Weight in (lb) to have BMI = 25: 164.1  GEN: WDWN, NAD, Non-toxic, A & O x 3, looks well HEENT: Atraumatic, Normocephalic. Neck supple. No masses, No LAD.  Bilateral TM wnl,  oropharynx normal.  PEERL,EOMI.   Ears and Nose: No external deformity. CV: RRR, No M/G/R. No JVD. No thrill. No extra heart sounds. PULM: CTA B, no wheezes, crackles, rhonchi. No retractions. No resp. distress. No accessory muscle use. ABD: S, NT, ND, +BS. No rebound. No HSM. EXTR: No c/c/e NEURO Normal gait.  PSYCH: Normally interactive. Conversant. Not depressed or anxious appearing.  Calm demeanor.  Normal neurological exam; normal sensation, strength and DTR of all extremities and normal balance  Not able to reproduce tenderness over her sacrum with palpation- she states that pressing on the area actually feels good to her  [facility] reading (PRIMARY) by  Dr. Lorelei Pont. Pelvis: negative PELVIS - 1-2 VIEW  COMPARISON: None.  FINDINGS: No evidence of acute, subacute or healed fractures. Sacroiliac joints and symphysis pubis intact without significant degenerative changes. Hip joints intact with well preserved joint spaces bilaterally. Bone mineral density well-preserved. Tiny calcification adjacent to the roof of the right acetabulum. Visualized lumbar spine intact.  IMPRESSION: 1. No acute osseous abnormality. 2. Tiny calcification adjacent to the roof of the right acetabulum. Acetabular rim calcifications can be due to labral calcifications/ossification, acetabular rim stress fractures, or os acetabuli. Os acetabuli and acetabular rim stress fractures put the patient at increased risk for pincer type femoroacetabular impingement.  Assessment and Plan: Gastroesophageal reflux disease without esophagitis - Plan: pantoprazole (PROTONIX) 40 MG tablet, ranitidine (ZANTAC) 150 MG tablet  Essential hypertension - Plan: nebivolol (BYSTOLIC) 5 MG tablet  Screening for HIV (human immunodeficiency virus) - Plan: HIV antibody  Benign paroxysmal positional vertigo, unspecified laterality  Chronic nasal congestion  Chronic fatigue - Plan: TSH, Comprehensive metabolic panel,  CBC  Snoring  Right sided abdominal pain  Adjustment disorder with anxious mood - Plan: clonazePAM (KLONOPIN) 0.5 MG tablet  Family history of diabetes mellitus - Plan: Hemoglobin A1c  Sacrum sprain, initial encounter - Plan: DG Pelvis 1-2 Views   For the time being she does not want to have an ENT referral for vertigo or her feeling of chronic nasal congestion.  Counseled her that chronic use of nasal decongestant sprays will lead to this issue but she is not interested in stopping use.   Refilled GERD medications Labs pending as above for HIV and DM screening Counseled her that fatigue is generally not due to a vitamin deficiency but is more often due to stress and other issues of modern life.  She is thin so OSA is less likely but something to keep in mind BP is well controlled Refilled her klonopin Normal appearing x-rays of her sacrum.  I do not have an explanation for why she has pain in this area 25 hours a day for over a year.  Offered an ortho referral but she declines.  counseled her that if she has such severe abdominal pain in the future she should seek care right away.  At this time her belly is benign  Plan further follow-up pending labs   I spent about an hour in direct pt care for this encounter due to the pt's many concerns as above.   Signed Lamar Blinks, MD

## 2014-05-31 DIAGNOSIS — F411 Generalized anxiety disorder: Secondary | ICD-10-CM | POA: Insufficient documentation

## 2014-06-02 ENCOUNTER — Encounter: Payer: Self-pay | Admitting: Family Medicine

## 2014-06-11 ENCOUNTER — Other Ambulatory Visit: Payer: Self-pay | Admitting: Neurology

## 2014-06-14 ENCOUNTER — Other Ambulatory Visit: Payer: Self-pay | Admitting: Emergency Medicine

## 2014-06-16 ENCOUNTER — Telehealth: Payer: Self-pay

## 2014-06-16 DIAGNOSIS — I10 Essential (primary) hypertension: Secondary | ICD-10-CM

## 2014-06-16 MED ORDER — NEBIVOLOL HCL 5 MG PO TABS: 5.0000 mg | ORAL_TABLET | Freq: Every day | ORAL | Status: DC

## 2014-06-16 NOTE — Telephone Encounter (Signed)
Refill sent on 12/11 OV. Notified pt this was resent.

## 2014-06-16 NOTE — Telephone Encounter (Signed)
Pt states that she is needing a refill on her bp medicine, and that the pharmacy has sent a request over. I did not see this in the system

## 2014-06-27 ENCOUNTER — Other Ambulatory Visit: Payer: Self-pay | Admitting: Neurology

## 2014-07-08 ENCOUNTER — Other Ambulatory Visit: Payer: Self-pay | Admitting: Neurology

## 2014-07-08 NOTE — Telephone Encounter (Signed)
I called patient.  Got no answer.  Left message.

## 2014-10-22 ENCOUNTER — Other Ambulatory Visit: Payer: Self-pay

## 2014-10-22 DIAGNOSIS — Z1231 Encounter for screening mammogram for malignant neoplasm of breast: Secondary | ICD-10-CM

## 2014-11-21 ENCOUNTER — Other Ambulatory Visit: Payer: Self-pay | Admitting: Family Medicine

## 2014-11-23 NOTE — Telephone Encounter (Signed)
Do you want to RF? 

## 2014-11-23 NOTE — Telephone Encounter (Signed)
I did a refill for her.  Please call her and ask her to come in for a recheck with me as it has been over 6 months

## 2014-11-24 NOTE — Telephone Encounter (Signed)
LMOM for pt that RF was sent but she needs OV for more.

## 2015-01-04 ENCOUNTER — Other Ambulatory Visit: Payer: Self-pay | Admitting: Obstetrics and Gynecology

## 2015-01-04 ENCOUNTER — Ambulatory Visit
Admission: RE | Admit: 2015-01-04 | Discharge: 2015-01-04 | Disposition: A | Discharge status: Home / Self Care | Payer: PRIVATE HEALTH INSURANCE | Source: Ambulatory Visit

## 2015-01-04 DIAGNOSIS — Z1231 Encounter for screening mammogram for malignant neoplasm of breast: Secondary | ICD-10-CM

## 2015-01-05 LAB — CYTOLOGY - PAP

## 2015-01-13 ENCOUNTER — Ambulatory Visit (INDEPENDENT_AMBULATORY_CARE_PROVIDER_SITE_OTHER): Payer: PRIVATE HEALTH INSURANCE

## 2015-01-13 ENCOUNTER — Ambulatory Visit (INDEPENDENT_AMBULATORY_CARE_PROVIDER_SITE_OTHER): Payer: PRIVATE HEALTH INSURANCE | Admitting: Family Medicine

## 2015-01-13 VITALS — BP 104/80 | HR 66 | Temp 98.7°F | Resp 16 | Ht 68.0 in | Wt 155.8 lb

## 2015-01-13 DIAGNOSIS — I10 Essential (primary) hypertension: Secondary | ICD-10-CM | POA: Diagnosis not present

## 2015-01-13 DIAGNOSIS — M545 Low back pain, unspecified: Secondary | ICD-10-CM

## 2015-01-13 DIAGNOSIS — K219 Gastro-esophageal reflux disease without esophagitis: Secondary | ICD-10-CM | POA: Diagnosis not present

## 2015-01-13 DIAGNOSIS — Z114 Encounter for screening for human immunodeficiency virus [HIV]: Secondary | ICD-10-CM | POA: Diagnosis not present

## 2015-01-13 DIAGNOSIS — R251 Tremor, unspecified: Secondary | ICD-10-CM | POA: Diagnosis not present

## 2015-01-13 DIAGNOSIS — F4323 Adjustment disorder with mixed anxiety and depressed mood: Secondary | ICD-10-CM | POA: Diagnosis not present

## 2015-01-13 DIAGNOSIS — M542 Cervicalgia: Secondary | ICD-10-CM | POA: Diagnosis not present

## 2015-01-13 MED ORDER — NEBIVOLOL HCL 5 MG PO TABS: 5.0000 mg | ORAL_TABLET | Freq: Every day | ORAL | Status: DC

## 2015-01-13 MED ORDER — PRIMIDONE 50 MG PO TABS: 100.0000 mg | ORAL_TABLET | Freq: Every day | ORAL | Status: DC

## 2015-01-13 MED ORDER — CLONAZEPAM 0.5 MG PO TABS: ORAL_TABLET | ORAL | Status: DC

## 2015-01-13 MED ORDER — RANITIDINE HCL 150 MG PO TABS: ORAL_TABLET | ORAL | Status: DC

## 2015-01-13 MED ORDER — PANTOPRAZOLE SODIUM 40 MG PO TBEC: 40.0000 mg | DELAYED_RELEASE_TABLET | Freq: Every day | ORAL | Status: DC

## 2015-01-13 MED ORDER — CELECOXIB 200 MG PO CAPS: 200.0000 mg | ORAL_CAPSULE | Freq: Every day | ORAL | Status: DC

## 2015-01-13 NOTE — Progress Notes (Signed)
[facility] [address] [place] [address] [phone]  Date:  01/13/2015   Name:  Sharon [name]   DOB:  [birth year]   MRN:  [id number]  PCP:  Lamar Blinks, MD    Chief Complaint: Medication Refill; Back Pain; blood work; and Adenopathy   History of Present Illness:  Sharon [name] is a 49 y.o. very pleasant female patient who presents with the following:  Last seen by myself in December of 2015 with multiple health concerns.  She is here today for medication refills,  She notes vertigo when she leans her head back to wash her hair.  This also occurs when she gets in or out of bed.  She has had this in the past, resolved for a while a came back over the last 4-5 months.  It is fairly persistent with the provocative maneuvers above.   She notes a "hole in my right eardrum and it's been draining a lot lately, it's just clear."  She has had this TIM defect for a long time, 2 operations did not succeed in fixing this problem.    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  It hurts "twice as bad as it was hurting last year.'  She is using ibuprofen.   She also notes neck pain.  She has noted the lower back pain for "a few years, it just keeps getting worse."  Her neck also feels "sore and tender on the outside."  She is concerned about LAD She saw her OBG last month, had her physical and mammogram.  She recently had cataract surgery and has a follow-up next month.    She notes that her sleep is poor, she does relax with the klonopin but tends to wake up after a few hours. They plan a sleep study soon to look at this further.  Her BF has noted that she snores and wakes herself up frequently.   She has occasional anxiety attacks during the day but is generally able to handle these with deep breathing   S/p hysterectomy.    She has a chronic tremor and takes primodone at bedtime-  this helps a lot and does not have SE  Her sexual partner is HIV positive- they do use protection but she likes to test every 6 months or so  Patient Active Problem List   Diagnosis Date Noted  . GAD (generalized anxiety disorder) 05/31/2014  . Chest pain 05/09/2011  . Anal fissure 05/05/2011  . Cough 10/25/2010  . Dyspnea 10/13/2010  . Hypertension 10/13/2010    Past Medical History  Diagnosis Date  . Hypertension   . Pulmonary embolism   . DVT (deep venous thrombosis)     left leg  . Endometriosis   . Detached retina   . History of hemorrhoids   . Anal fissure   . Blood clot in vein     left leg  . Anxiety   . Heart murmur     Past Surgical History  Procedure Laterality Date  . Tubal ligation    . Inner ear surgery    . Vein surgery  04/2010  . Hemorrhoid surgery    . Retinal detachment surgery    . Incontinence surgery    . Endometrial ablation    . Abdominal hysterectomy    . Rectal prolapse repair    . Eye  surgery      History  Substance Use Topics  . Smoking status: Never Smoker   . Smokeless tobacco: Never Used  . Alcohol Use: Yes     Comment: 2 drinks per wk    Family History  Problem Relation Age of Onset  . Emphysema Mother     smoker  . Allergies Mother   . Asthma Mother   . Heart disease Mother   . Asthma Father   . Heart disease Father   . Diabetes Father   . Asthma Brother   . Lung cancer Maternal Grandfather     was a smoker  . Cancer Maternal Grandfather     lung  . Heart disease Paternal Grandmother   . Heart disease Paternal Grandfather     Allergies  Allergen Reactions  . Dextromethorphan Polistirex Er     Pt states caused "a lump" in her throat, making it difficult to swallow  . Hydrocodone Nausea And Vomiting  . Penicillins     Unknown     Medication list has been reviewed and updated.  Current Outpatient Prescriptions on File Prior to Visit  Medication Sig Dispense Refill  . acyclovir (ZOVIRAX) 400 MG tablet  Take 1 tablet by mouth daily.    . clonazePAM (KLONOPIN) 0.5 MG tablet TAKE 1 TABLET BY MOUTH AT BEDTIME AND TAKE 1/2 TABLET DURING THE DAY AS NEEDED ANXIETY 60 tablet 0  . nebivolol (BYSTOLIC) 5 MG tablet Take 1 tablet (5 mg total) by mouth daily. 30 tablet 11  . Nitroglycerin (RECTIV) 0.4 % OINT Place 0.4 inches rectally 2 (two) times daily. Apply 1 in of Rectiv intra-anally x 3 weeks. 1 Tube 3  . pantoprazole (PROTONIX) 40 MG tablet Take 1 tablet (40 mg total) by mouth daily. 30 tablet 11  . primidone (MYSOLINE) 50 MG tablet TAKE 2 TABLETS (100 MG TOTAL) BY MOUTH AT BEDTIME. 30 tablet 0  . primidone (MYSOLINE) 50 MG tablet TAKE 2 TABLETS (100 MG TOTAL) BY MOUTH AT BEDTIME. 30 tablet 0  . ranitidine (ZANTAC) 150 MG tablet Take 2 tablets in the morning as needed for reflux 60 tablet 9  . vitamin B-12 (CYANOCOBALAMIN) 1000 MCG tablet Take 1,000 mcg by mouth daily.    . NON FORMULARY Diltiazem hcl gel 2 % 4 times daily      No current facility-administered medications on file prior to visit.    Review of Systems:  As per HPI- otherwise negative.   Physical Examination: Filed Vitals:   01/13/15 0905  BP: 104/80  Pulse: 66  Temp: 98.7 F (37.1 C)  Resp: 16   Filed Vitals:   01/13/15 0905  Height: 5\' 8"  (1.727 m)  Weight: 155 lb 12.8 oz (70.67 kg)   Body mass index is 23.69 kg/(m^2). Ideal Body Weight: Weight in (lb) to have BMI = 25: 164.1  GEN: WDWN, NAD, Non-toxic, A & O x 3 HEENT: Atraumatic, Normocephalic. Neck supple. No masses, No LAD. Ears and Nose: No external deformity. CV: RRR, No M/G/R. No JVD. No thrill. No extra heart sounds. PULM: CTA B, no wheezes, crackles, rhonchi. No retractions. No resp. distress. No accessory muscle use. ABD: S, NT, ND, +BS. No rebound. No HSM. EXTR: No c/c/e NEURO Normal gait.  PSYCH: Normally interactive. Conversant. Not depressed or anxious appearing.  Calm demeanor.   [facility] reading (PRIMARY) by  Dr. Lorelei Pont. Cervical spine: minimal  degenerative change Lumbar spine: minimal degenerative change  CERVICAL SPINE 4+ VIEWS  COMPARISON: None.  FINDINGS:  The cervical spine is visualized to the level of C7-T1.  The vertebral body heights are maintained. The alignment is normal. The prevertebral soft tissues are normal. There is no acute fracture or static listhesis. Minimal degenerative disc disease at C4-5.  IMPRESSION: No acute osseous injury of the cervical spine.  LUMBAR SPINE - COMPLETE 4+ VIEW  COMPARISON: None.  FINDINGS: There is no evidence of lumbar spine fracture. Alignment is normal. Intervertebral disc spaces are maintained. Lower lumbar facet arthropathy most pronounced at L5-S1, RIGHT greater than LEFT. Minimal leftward spurring at L3-L4. No visible pars defects. Surgical clip in the RIGHT pelvis. Unremarkable gas pattern.  IMPRESSION: Mild degenerative changes as described. No acute abnormality. Anatomic alignment.  Assessment and Plan: Gastroesophageal reflux disease without esophagitis - Plan: pantoprazole (PROTONIX) 40 MG tablet, ranitidine (ZANTAC) 150 MG tablet  Essential hypertension - Plan: nebivolol (BYSTOLIC) 5 MG tablet  Tremor - Plan: primidone (MYSOLINE) 50 MG tablet  Adjustment disorder with mixed anxiety and depressed mood - Plan: clonazePAM (KLONOPIN) 0.5 MG tablet  Neck pain - Plan: DG Cervical Spine Complete, celecoxib (CELEBREX) 200 MG capsule  Bilateral low back pain without sciatica - Plan: DG Lumbar Spine Complete, celecoxib (CELEBREX) 200 MG capsule  Screening for HIV (human immunodeficiency virus) - Plan: celecoxib (CELEBREX) 200 MG capsule  Refilled her medications for GERD, tremor, HTH She would like to try celebrex for her arthritis pain.  She has had some CP in the past but her 2012 stress test was normal.  Refilled her klonopin- she wonders about a higher dose for sleep. Urged her to get her sleep study first.    Signed Lamar Blinks, MD

## 2015-01-13 NOTE — Patient Instructions (Signed)
Your neck and back look ok- you have mild arthritis which is normal Try the celebrex as needed for pain- take this INSTEAD of ibuprofen; don't take both concurrently You can also try some tylenol as needed  We did your refills today I think getting the sleep study is a great idea We will do your HIV screening and get in touch with you

## 2015-01-14 ENCOUNTER — Encounter: Payer: Self-pay | Admitting: Family Medicine

## 2015-01-14 LAB — HIV ANTIBODY (ROUTINE TESTING W REFLEX): HIV: NONREACTIVE

## 2015-03-01 ENCOUNTER — Institutional Professional Consult (permissible substitution): Payer: PRIVATE HEALTH INSURANCE | Admitting: Pulmonary Disease

## 2015-03-09 ENCOUNTER — Ambulatory Visit (INDEPENDENT_AMBULATORY_CARE_PROVIDER_SITE_OTHER): Payer: PRIVATE HEALTH INSURANCE | Admitting: Pulmonary Disease

## 2015-03-09 ENCOUNTER — Encounter: Payer: Self-pay | Admitting: Pulmonary Disease

## 2015-03-09 ENCOUNTER — Other Ambulatory Visit (INDEPENDENT_AMBULATORY_CARE_PROVIDER_SITE_OTHER): Payer: PRIVATE HEALTH INSURANCE

## 2015-03-09 VITALS — BP 118/70 | HR 68 | Ht 68.0 in | Wt 157.2 lb

## 2015-03-09 DIAGNOSIS — D649 Anemia, unspecified: Secondary | ICD-10-CM

## 2015-03-09 DIAGNOSIS — G2581 Restless legs syndrome: Secondary | ICD-10-CM

## 2015-03-09 DIAGNOSIS — G4733 Obstructive sleep apnea (adult) (pediatric): Secondary | ICD-10-CM

## 2015-03-09 LAB — COMPREHENSIVE METABOLIC PANEL
ALK PHOS: 59 U/L (ref 39–117)
ALT: 12 U/L (ref 0–35)
AST: 12 U/L (ref 0–37)
Albumin: 4.3 g/dL (ref 3.5–5.2)
BUN: 18 mg/dL (ref 6–23)
CALCIUM: 9.2 mg/dL (ref 8.4–10.5)
CO2: 27 mEq/L (ref 19–32)
Chloride: 103 mEq/L (ref 96–112)
Creatinine, Ser: 0.62 mg/dL (ref 0.40–1.20)
GFR: 108.07 mL/min (ref >60.00)
Glucose, Bld: 85 mg/dL (ref 70–99)
POTASSIUM: 4.1 meq/L (ref 3.5–5.1)
SODIUM: 137 meq/L (ref 135–145)
TOTAL PROTEIN: 7.4 g/dL (ref 6.0–8.3)
Total Bilirubin: 0.6 mg/dL (ref 0.2–1.2)

## 2015-03-09 LAB — CBC
HEMATOCRIT: 38.9 % (ref 36.0–46.0)
HEMOGLOBIN: 13.1 g/dL (ref 12.0–15.0)
MCHC: 33.8 g/dL (ref 30.0–36.0)
MCV: 88.9 fl (ref 78.0–100.0)
PLATELETS: 270 10*3/uL (ref 150.0–400.0)
RBC: 4.37 Mil/uL (ref 3.87–5.11)
RDW: 13 % (ref 11.5–15.5)
WBC: 6.8 10*3/uL (ref 4.0–10.5)

## 2015-03-09 LAB — FERRITIN: Ferritin: 38.1 ng/mL (ref 10.0–291.0)

## 2015-03-09 NOTE — Patient Instructions (Signed)
Lab test today Will arrange for sleep study Will call to arrange for follow up after sleep study reviewed

## 2015-03-09 NOTE — Progress Notes (Signed)
Chief Complaint  Patient presents with  . Sleep Consult    Pt referred by Dr. Gaetano Net.pt states she does not sleep well, pt can not sleep on left side at all. pt c/o snoring and no being able to breath. pt states when she wakes up in the morning she is really restless. Epworth score: 11    History of Present Illness: Sharon [name] is a 49 y.o. female for evaluation of sleep problems.  She was recently seen by her Ob/Gyn.  She was noted to have more daytime sleepiness.  She snores, and her boyfriend has told her that she stops breathing while asleep.  She will wake up feeling like she is choked, and this happens more on her Lt side.  She has trouble breathing through her nose at night, but not during the day.  She will also wake up feeling sweaty and anxious.    She goes to sleep between 830 and 1030 pm.  She falls asleep instantly.  She wakes up 3 to 5 times to use the bathroom.  She gets out of bed at 7 am.  She feels tired in the morning.  She denies morning headache.  She does not use anything to help her stay awake.  She has been using klonopin to help her anxiety and this helps her sleep also.  She will fall asleep while watching TV or working on computer.  She also gets this feeling like something is crawling in her skin on her her legs.  This happens when she sits still, and is better when she moves.  This does cause trouble with her sleep.  Several of her family members have restless leg syndrome.  Klonopin helps some with her leg symptoms.  She denies sleep walking, sleep talking, bruxism, or nightmares.  She denies sleep hallucinations, sleep paralysis, or cataplexy.  The Epworth score is 11 out of 24.  CARMELL ELGIN  has a past medical history of Hypertension; Pulmonary embolism; DVT (deep venous thrombosis); Endometriosis; Detached retina; History of hemorrhoids; Anal fissure; Blood clot in vein; Anxiety; Heart murmur; and Allergy.  AVAYA MCJUNKINS  has past surgical history  that includes Tubal ligation; Inner ear surgery; Vein Surgery (04/2010); Hemorrhoid surgery; Retinal detachment surgery; Incontinence surgery; Endometrial ablation; Abdominal hysterectomy; Rectal prolapse repair; and Eye surgery.  Prior to Admission medications   Medication Sig Start Date End Date Taking? Authorizing Provider  acyclovir (ZOVIRAX) 400 MG tablet Take 1 tablet by mouth daily. 07/04/10   Historical Provider, MD  celecoxib (CELEBREX) 200 MG capsule Take 1 capsule (200 mg total) by mouth daily. 01/13/15   Darreld Mclean, MD  clonazePAM (KLONOPIN) 0.5 MG tablet Take 1 tablet at bedtime and 1/2 during the day as needed 01/13/15   Gay Filler Copland, MD  nebivolol (BYSTOLIC) 5 MG tablet Take 1 tablet (5 mg total) by mouth daily. 01/13/15   Gay Filler Copland, MD  Nitroglycerin (RECTIV) 0.4 % OINT Place 0.4 inches rectally 2 (two) times daily. Apply 1 in of Rectiv intra-anally x 3 weeks. 05/06/12   Stark Klein, MD  NON FORMULARY Diltiazem hcl gel 2 % 4 times daily     Historical Provider, MD  pantoprazole (PROTONIX) 40 MG tablet Take 1 tablet (40 mg total) by mouth daily. 01/13/15   Darreld Mclean, MD  primidone (MYSOLINE) 50 MG tablet TAKE 2 TABLETS (100 MG TOTAL) BY MOUTH AT BEDTIME. 06/28/14   Star Age, MD  primidone (MYSOLINE) 50 MG tablet Take 2  tablets (100 mg total) by mouth at bedtime. 01/13/15   Darreld Mclean, MD  ranitidine (ZANTAC) 150 MG tablet Take 2 tablets in the morning as needed for reflux 01/13/15   Gay Filler Copland, MD  vitamin B-12 (CYANOCOBALAMIN) 1000 MCG tablet Take 1,000 mcg by mouth daily.    Historical Provider, MD    Allergies  Allergen Reactions  . Dextromethorphan Polistirex Er     Pt states caused "a lump" in her throat, making it difficult to swallow  . Hydrocodone Nausea And Vomiting  . Penicillins     Unknown     Her family history includes Allergies in her mother; Asthma in her brother, father, and mother; Cancer in her maternal grandfather;  Diabetes in her father; Emphysema in her mother; Heart disease in her father, mother, paternal grandfather, and paternal grandmother; Lung cancer in her maternal grandfather.  She  reports that she has never smoked. She has never used smokeless tobacco. She reports that she drinks alcohol. She reports that she does not use illicit drugs.  Review of Systems  Constitutional: Negative for fever and unexpected weight change.  HENT: Positive for sinus pressure. Negative for congestion, dental problem, ear pain, nosebleeds, postnasal drip, rhinorrhea, sneezing, sore throat and trouble swallowing.   Eyes: Negative for redness and itching.  Respiratory: Positive for chest tightness and shortness of breath. Negative for cough and wheezing.   Cardiovascular: Positive for palpitations. Negative for leg swelling.  Gastrointestinal: Negative for nausea and vomiting.  Genitourinary: Negative for dysuria.  Musculoskeletal: Negative for joint swelling.  Skin: Negative for rash.  Neurological: Negative for headaches.  Hematological: Bruises/bleeds easily.  Psychiatric/Behavioral: Negative for dysphoric mood. The patient is not nervous/anxious.      Physical Exam: BP 118/70 mmHg  Pulse 68  Ht 5\' 8"  (1.727 m)  Wt 157 lb 3.2 oz (71.305 kg)  BMI 23.91 kg/m2  SpO2 100%  LMP 10/11/2010  General - No distress ENT - No sinus tenderness, no oral exudate, no LAN, no thyromegaly, TM clear, pupils equal/reactive, MP 3 Cardiac - s1s2 regular, no murmur, pulses symmetric Chest - No wheeze/rales/dullness, good air entry, normal respiratory excursion Back - No focal tenderness Abd - Soft, non-tender, no organomegaly, + bowel sounds Ext - No edema Neuro - Normal strength, cranial nerves intact Skin - No rashes Psych - Normal mood, and behavior  Discussion: She has snoring, sleep disruption, witnessed apnea, and daytime sleepiness.  She has a hx of HTN.  I am concerned she could have sleep apnea.  She also  gives history consistent with restless leg syndrome.  We discussed how sleep apnea can affect various health problems including risks for hypertension, cardiovascular disease, and diabetes.  We also discussed how sleep disruption can increase risks for accident, such as while driving.  Weight loss as a means of improving sleep apnea was also reviewed.  Additional treatment options discussed were CPAP therapy, oral appliance, and surgical intervention.  Assessment/plan:  Obstructive sleep apnea. Plan: - to further assess will arrange for in lab sleep study to further assess - she might be candidate for oral appliance if she does have sleep apnea, depending on the it's severity  Restless leg syndrome. Plan: - will check labs, including ferritin - if ferritin is < 50, then she might benefit from iron supplementation - will assess for periodic limb movements during in lab sleep study    Chesley Mires, M.D. Pager [phone]

## 2015-03-09 NOTE — Progress Notes (Deleted)
   Subjective:    Patient ID: Sharon [name], female    DOB: February 23, 1966, 49 y.o.   MRN: [id number]  HPI    Review of Systems  Constitutional: Negative for fever and unexpected weight change.  HENT: Positive for sinus pressure. Negative for congestion, dental problem, ear pain, nosebleeds, postnasal drip, rhinorrhea, sneezing, sore throat and trouble swallowing.   Eyes: Negative for redness and itching.  Respiratory: Positive for chest tightness and shortness of breath. Negative for cough and wheezing.   Cardiovascular: Positive for palpitations. Negative for leg swelling.  Gastrointestinal: Negative for nausea and vomiting.  Genitourinary: Negative for dysuria.  Musculoskeletal: Negative for joint swelling.  Skin: Negative for rash.  Neurological: Negative for headaches.  Hematological: Bruises/bleeds easily.  Psychiatric/Behavioral: Negative for dysphoric mood. The patient is not nervous/anxious.        Objective:   Physical Exam        Assessment & Plan:

## 2015-03-15 ENCOUNTER — Telehealth: Payer: Self-pay | Admitting: Pulmonary Disease

## 2015-03-15 NOTE — Telephone Encounter (Signed)
CMP Latest Ref Rng 03/09/2015 05/30/2014 11/05/2013  Glucose 70 - 99 mg/dL 85 77 90  BUN 6 - 23 mg/dL 18 11 15   Creatinine 0.40 - 1.20 mg/dL 0.62 0.68 0.70  Sodium 135 - 145 mEq/L 137 139 136  Potassium 3.5 - 5.1 mEq/L 4.1 4.2 4.3  Chloride 96 - 112 mEq/L 103 101 99  CO2 19 - 32 mEq/L 27 28 25   Calcium 8.4 - 10.5 mg/dL 9.2 9.7 10.1  Total Protein 6.0 - 8.3 g/dL 7.4 7.1 7.9  Total Bilirubin 0.2 - 1.2 mg/dL 0.6 0.5 0.9  Alkaline Phos 39 - 117 U/L 59 58 57  AST 0 - 37 U/L 12 9 11   ALT 0 - 35 U/L 12 8 9     CBC Latest Ref Rng 03/09/2015 05/30/2014 11/05/2013  WBC 4.0 - 10.5 K/uL 6.8 6.5 10.3(A)  Hemoglobin 12.0 - 15.0 g/dL 13.1 13.6 14.1  Hematocrit 36.0 - 46.0 % 38.9 39.1 41.5  Platelets 150.0 - 400.0 K/uL 270.0 319 -    Lab Results  Component Value Date   FERRITIN 38.1 03/09/2015    Will have my nurse inform pt that her iron level is relatively low.  This could explain her leg symptoms at night.  She needs to start taking ferrous sulfate 325 mg daily with meals.  She should take 200 mg vitamin C with the ferrous sulfate, and avoid taking with dairy products.  If she tolerates once daily dosing, then after 2 weeks she should try increasing to twice daily dosing for ferrous sulfate and vitamin C.

## 2015-03-17 NOTE — Telephone Encounter (Signed)
Pt returning call.Sharon [name] ° °

## 2015-03-17 NOTE — Telephone Encounter (Signed)
lmomtcb x1 

## 2015-03-17 NOTE — Telephone Encounter (Signed)
lmtcb x2 for pt. 

## 2015-03-17 NOTE — Telephone Encounter (Signed)
Ok to leave a msg there is no recepiton in the building please leave a detailed msg  [phone]

## 2015-03-17 NOTE — Telephone Encounter (Signed)
lmtcb

## 2015-03-18 NOTE — Telephone Encounter (Signed)
LVM for pt to return call

## 2015-03-18 NOTE — Telephone Encounter (Signed)
Spoke with pt and notified of results per Dr. Halford Chessman. Pt verbalized understanding and denied any questions. She read the instructions back to me and will call with any problems/concerns

## 2015-05-12 ENCOUNTER — Ambulatory Visit (HOSPITAL_BASED_OUTPATIENT_CLINIC_OR_DEPARTMENT_OTHER): Payer: PRIVATE HEALTH INSURANCE | Attending: Pulmonary Disease

## 2015-05-12 VITALS — Ht 68.0 in | Wt 149.0 lb

## 2015-05-12 DIAGNOSIS — G4733 Obstructive sleep apnea (adult) (pediatric): Secondary | ICD-10-CM | POA: Insufficient documentation

## 2015-05-12 DIAGNOSIS — R0683 Snoring: Secondary | ICD-10-CM | POA: Diagnosis not present

## 2015-05-12 DIAGNOSIS — Z79899 Other long term (current) drug therapy: Secondary | ICD-10-CM | POA: Insufficient documentation

## 2015-05-12 DIAGNOSIS — G4710 Hypersomnia, unspecified: Secondary | ICD-10-CM | POA: Diagnosis not present

## 2015-05-21 ENCOUNTER — Telehealth: Payer: Self-pay | Admitting: Pulmonary Disease

## 2015-05-21 DIAGNOSIS — G4710 Hypersomnia, unspecified: Secondary | ICD-10-CM | POA: Diagnosis not present

## 2015-05-21 NOTE — Telephone Encounter (Signed)
Results have been explained to patient, pt expressed understanding.  Scheduled for OV with TP 05/25/15 at 215p. Nothing further needed.

## 2015-05-21 NOTE — Progress Notes (Signed)
Patient Name: Sharon [name], Sharon [name] Date: 05/12/2015 Gender: Female D.O.B: [birth year] Age (years): 45 Referring Provider: Chesley Mires MD, ABSM Height (inches): 68 Interpreting Physician: Chesley Mires MD, ABSM Weight (lbs): 149 RPSGT: Jonna Coup BMI: 23 MRN: [id number] Neck Size: 13.00  CLINICAL INFORMATION Sleep Study Type: NPSG Indication for sleep study: OSA Epworth Sleepiness Score: 11  SLEEP STUDY TECHNIQUE As per the AASM Manual for the Scoring of Sleep and Associated Events v2.3 (April 2016) with a hypopnea requiring 4% desaturations. The channels recorded and monitored were frontal, central and occipital EEG, electrooculogram (EOG), submentalis EMG (chin), nasal and oral airflow, thoracic and abdominal wall motion, anterior tibialis EMG, snore microphone, electrocardiogram, and pulse oximetry.  MEDICATIONS Patient's medications include: reviewed in electronic medical record. Medications self-administered by patient during sleep study : Sleep medicine administered - KLONOPIN at 09:00:28 PM  SLEEP ARCHITECTURE The study was initiated at 9:57:28 PM and ended at 4:08:52 AM. Sleep onset time was 75.4 minutes and the sleep efficiency was 57.4%. The total sleep time was 213.0 minutes. Stage REM latency was 249.0 minutes. The patient spent 1.88% of the night in stage N1 sleep, 84.51% in stage N2 sleep, 0.47% in stage N3 and 13.15% in REM. Alpha intrusion was absent. Supine sleep was 7.68%.  RESPIRATORY PARAMETERS The overall apnea/hypopnea index (AHI) was 0.0 per hour. There were 0 total apneas, including 0 obstructive, 0 central and 0 mixed apneas. There were 0 hypopneas and 0 RERAs. The AHI during Stage REM sleep was 0.0 per hour. AHI while supine was 0.0 per hour. The mean oxygen saturation was 94.43%. The minimum SpO2 during sleep was 92.00%. Soft snoring was noted during this study.  CARDIAC DATA The 2 lead EKG demonstrated sinus rhythm. The mean heart rate was 66.09  beats per minute. Other EKG findings include: None.  LEG MOVEMENT DATA The total PLMS were 0 with a resulting PLMS index of 0.00. Associated arousal with leg movement index was 0.0 .  IMPRESSIONS This study did not demonstrate sleep apnea, oxygen desaturation, or periodic limb movements.  DIAGNOSIS - Hypersomnia (780.54 [G47.10 ICD-10])  RECOMMENDATIONS Additional evaluation for her hypersomnia could include either a multiple sleep latency test (MSLT) and/or a maintenance of wakefulness test (MWT).  Proper sleep hygiene should also be reviewed.   Chesley Mires, MD, [place], American Board of Sleep Medicine 05/21/2015, 12:45 PM  NPI: [id number]

## 2015-05-21 NOTE — Telephone Encounter (Signed)
PSG 05/12/15 >> AHI 0, SpO2 low 92%, PLMI 0.  Will have my nurse inform pt that sleep study did not show sleep apnea.  She needs ROV to review test results in more detail, and assess therapy for restless leg syndrome >> can be with me or Tammy Parrett.

## 2015-05-25 ENCOUNTER — Ambulatory Visit (INDEPENDENT_AMBULATORY_CARE_PROVIDER_SITE_OTHER): Payer: PRIVATE HEALTH INSURANCE | Admitting: Adult Health

## 2015-05-25 ENCOUNTER — Encounter: Payer: Self-pay | Admitting: Adult Health

## 2015-05-25 VITALS — BP 116/82 | HR 77 | Temp 98.6°F | Ht 68.0 in | Wt 156.0 lb

## 2015-05-25 DIAGNOSIS — G4710 Hypersomnia, unspecified: Secondary | ICD-10-CM

## 2015-05-25 NOTE — Progress Notes (Addendum)
Subjective:    Patient ID: Sharon [name], female    DOB: December 06, 1965, 49 y.o.   MRN: [id number]  HPI 49 yo female seen for sleep consult 02/2015 for daytime sleepiness and snoring   TEST :  PSG 05/12/15 >AHI 0, SaO2 at 92%. PLMI 0   05/25/2015 Follow up : Hypersomnia  Pt returns for follow up to discuss sleep study results.  Pt was recently seen 03/09/15 for sleep consult . She complains of daytime sleepiness and  Snoring. Most of symptoms seem to occur when she lies on her left side.  She has nasal stuffiness and uses Flonase. Has seen ENT in past.  She also complains of crawling sensation on legs .  She was set up for a PSG on 11/23 that showed no sign OSA with AHI 0 and PLMI 0  She feels she did not sleep well during this test.  We discussed these results .  She has no sleep walking, sleep paralysis or cataplexy.  Feels tired during daytime but does not fall asleep.  Labs showed nml hbg. Ferritin level was low at 38 She was recommended to begin ferrous sulfate but did not do this as she Has severe constipation and anal fissues /hems and is afraid to take this as  It may cause this to be worse.  We discussed following up with her PCP to disucss this.       Past Medical History  Diagnosis Date  . Hypertension   . Pulmonary embolism ([place])   . DVT (deep venous thrombosis) ([facility])     left leg  . Endometriosis   . Detached retina   . History of hemorrhoids   . Anal fissure   . Blood clot in vein     left leg  . Anxiety   . Heart murmur   . Allergy    Current Outpatient Prescriptions on File Prior to Visit  Medication Sig Dispense Refill  . acyclovir (ZOVIRAX) 400 MG tablet Take 1 tablet by mouth daily.    . celecoxib (CELEBREX) 200 MG capsule Take 1 capsule (200 mg total) by mouth daily. 30 capsule 2  . Cholecalciferol (VITAMIN D) 2000 UNITS tablet Take 2,000 Units by mouth daily.    . clonazePAM (KLONOPIN) 0.5 MG tablet Take 1 tablet at bedtime and 1/2 during the day  as needed 60 tablet 5  . nebivolol (BYSTOLIC) 5 MG tablet Take 1 tablet (5 mg total) by mouth daily. 90 tablet 3  . Nitroglycerin (RECTIV) 0.4 % OINT Place 0.4 inches rectally 2 (two) times daily. Apply 1 in of Rectiv intra-anally x 3 weeks. 1 Tube 3  . NON FORMULARY Diltiazem hcl gel 2 % 4 times daily     . pantoprazole (PROTONIX) 40 MG tablet Take 1 tablet (40 mg total) by mouth daily. 90 tablet 3  . primidone (MYSOLINE) 50 MG tablet TAKE 2 TABLETS (100 MG TOTAL) BY MOUTH AT BEDTIME. 30 tablet 0  . ranitidine (ZANTAC) 150 MG tablet Take 2 tablets in the morning as needed for reflux 180 tablet 3  . vitamin B-12 (CYANOCOBALAMIN) 1000 MCG tablet Take 1,000 mcg by mouth daily.     No current facility-administered medications on file prior to visit.     Review of Systems Constitutional:   No  weight loss, night sweats,  Fevers, chills, fatigue, or  lassitude.  HEENT:   No headaches,  Difficulty swallowing,  Tooth/dental problems, or  Sore throat,  No sneezing, itching, ear ache, nasal congestion, post nasal drip,   CV:  No chest pain,  Orthopnea, PND, swelling in lower extremities, anasarca, dizziness, palpitations, syncope.   GI  No heartburn, indigestion, abdominal pain, nausea, vomiting, diarrhea, change in bowel habits, loss of appetite, bloody stools.   Resp: No shortness of breath with exertion or at rest.  No excess mucus, no productive cough,  No non-productive cough,  No coughing up of blood.  No change in color of mucus.  No wheezing.  No chest wall deformity  Skin: no rash or lesions.  GU: no dysuria, change in color of urine, no urgency or frequency.  No flank pain, no hematuria   MS:  No joint pain or swelling.  No decreased range of motion.  No back pain.  Psych:  No change in mood or affect. No depression or anxiety.  No memory loss.         Objective:   Physical Exam GEN: A/Ox3; pleasant , NAD, well nourished   HEENT:  [place]/AT,  EACs-clear, TMs-wnl,  NOSE-clear, THROAT-clear, no lesions, no postnasal drip or exudate noted.  Class 2 MP airway   NECK:  Supple w/ fair ROM; no JVD; normal carotid impulses w/o bruits; no thyromegaly or nodules palpated; no lymphadenopathy.  RESP  Clear  P & A; w/o, wheezes/ rales/ or rhonchi.no accessory muscle use, no dullness to percussion  CARD:  RRR, no m/r/g  , no peripheral edema, pulses intact, no cyanosis or clubbing.  GI:   Soft & nt; nml bowel sounds; no organomegaly or masses detected.  Musco: Warm bil, no deformities or joint swelling noted.   Neuro: alert, no focal deficits noted.    Skin: Warm, no lesions or rashes         Assessment & Plan:

## 2015-05-25 NOTE — Patient Instructions (Signed)
Continue on with healthy sleep regimen  Saline nasal rinses As needed  And gel As needed   Follow up primary MD for low iron levels.  follow up Dr. Halford Chessman  As needed

## 2015-05-26 NOTE — Assessment & Plan Note (Signed)
Sleep study with no OSA or PLM  Ferritin level was on low side, advised to follow up with PCP to discuss iron use.  Advised on healthy sleep regimen  May need to revisit ENT   Plan  Continue on with healthy sleep regimen  Saline nasal rinses As needed  And gel As needed   Follow up primary MD for low iron levels.  follow up Dr. Halford Chessman  As needed

## 2015-05-26 NOTE — Progress Notes (Signed)
Reviewed and agree with assessment/plan. 

## 2015-07-09 ENCOUNTER — Encounter: Payer: Self-pay | Admitting: Family Medicine

## 2015-07-14 ENCOUNTER — Encounter: Payer: Self-pay | Admitting: Family Medicine

## 2015-07-15 ENCOUNTER — Other Ambulatory Visit: Payer: Self-pay | Admitting: Family Medicine

## 2015-09-06 ENCOUNTER — Telehealth: Payer: Self-pay

## 2015-09-06 NOTE — Telephone Encounter (Signed)
Pt states she is in need of her KLONOPIN 0.5 MG. Please call [phone] when ready for pick up

## 2015-09-06 NOTE — Telephone Encounter (Signed)
Left message to see if pt is going to see Dr. Lorelei Pont in [facility]. If not she will have to establish care here with someone else.

## 2015-09-07 ENCOUNTER — Telehealth: Payer: Self-pay

## 2015-09-07 ENCOUNTER — Other Ambulatory Visit: Payer: Self-pay | Admitting: Emergency Medicine

## 2015-09-07 ENCOUNTER — Telehealth: Payer: Self-pay | Admitting: Emergency Medicine

## 2015-09-07 DIAGNOSIS — F411 Generalized anxiety disorder: Secondary | ICD-10-CM

## 2015-09-07 MED ORDER — CLONAZEPAM 0.5 MG PO TABS: ORAL_TABLET | ORAL | Status: DC

## 2015-09-07 NOTE — Telephone Encounter (Signed)
Rx called in 

## 2015-09-07 NOTE — Telephone Encounter (Signed)
Patient is calling for her Klonopin.

## 2015-09-07 NOTE — Telephone Encounter (Signed)
Phone message. Patient needs her Klonopin. She states she will come into the office to be seen to discuss her situation. I did agree to give her 1 refill on her Klonopin and will fax that to the pharmacy.

## 2015-10-21 ENCOUNTER — Ambulatory Visit (INDEPENDENT_AMBULATORY_CARE_PROVIDER_SITE_OTHER): Payer: PRIVATE HEALTH INSURANCE

## 2015-10-21 ENCOUNTER — Ambulatory Visit (INDEPENDENT_AMBULATORY_CARE_PROVIDER_SITE_OTHER): Payer: PRIVATE HEALTH INSURANCE | Admitting: Family Medicine

## 2015-10-21 VITALS — BP 132/82 | HR 70 | Temp 97.8°F | Resp 16 | Ht 68.0 in | Wt 165.8 lb

## 2015-10-21 DIAGNOSIS — R0602 Shortness of breath: Secondary | ICD-10-CM

## 2015-10-21 DIAGNOSIS — Z114 Encounter for screening for human immunodeficiency virus [HIV]: Secondary | ICD-10-CM

## 2015-10-21 DIAGNOSIS — R0789 Other chest pain: Secondary | ICD-10-CM

## 2015-10-21 DIAGNOSIS — M542 Cervicalgia: Secondary | ICD-10-CM

## 2015-10-21 DIAGNOSIS — K219 Gastro-esophageal reflux disease without esophagitis: Secondary | ICD-10-CM

## 2015-10-21 DIAGNOSIS — I10 Essential (primary) hypertension: Secondary | ICD-10-CM | POA: Diagnosis not present

## 2015-10-21 DIAGNOSIS — B354 Tinea corporis: Secondary | ICD-10-CM

## 2015-10-21 DIAGNOSIS — M545 Low back pain, unspecified: Secondary | ICD-10-CM

## 2015-10-21 DIAGNOSIS — Z1211 Encounter for screening for malignant neoplasm of colon: Secondary | ICD-10-CM | POA: Diagnosis not present

## 2015-10-21 DIAGNOSIS — F411 Generalized anxiety disorder: Secondary | ICD-10-CM

## 2015-10-21 LAB — POCT CBC
GRANULOCYTE PERCENT: 69.3 % (ref 37–80)
HCT, POC: 37.3 % — AB (ref 37.7–47.9)
HEMOGLOBIN: 13.1 g/dL (ref 12.2–16.2)
Lymph, poc: 2 (ref 0.6–3.4)
MCH: 30.7 pg (ref 27–31.2)
MCHC: 35 g/dL (ref 31.8–35.4)
MCV: 87.7 fL (ref 80–97)
MID (cbc): 0.7 (ref 0–0.9)
MPV: 7.6 fL (ref 0–99.8)
PLATELET COUNT, POC: 288 10*3/uL (ref 142–424)
POC Granulocyte: 6 (ref 2–6.9)
POC LYMPH PERCENT: 23 %L (ref 10–50)
POC MID %: 7.7 %M (ref 0–12)
RBC: 4.26 M/uL (ref 4.04–5.48)
RDW, POC: 12.9 %
WBC: 8.7 10*3/uL (ref 4.6–10.2)

## 2015-10-21 LAB — POC MICROSCOPIC URINALYSIS (UMFC)

## 2015-10-21 LAB — POCT URINALYSIS DIP (MANUAL ENTRY)
Bilirubin, UA: NEGATIVE
Glucose, UA: NEGATIVE
Ketones, POC UA: NEGATIVE
Leukocytes, UA: NEGATIVE
NITRITE UA: NEGATIVE
PROTEIN UA: NEGATIVE
SPEC GRAV UA: 1.025
UROBILINOGEN UA: 0.2
pH, UA: 5.5

## 2015-10-21 MED ORDER — CLONAZEPAM 0.5 MG PO TABS: ORAL_TABLET | ORAL | Status: DC

## 2015-10-21 MED ORDER — RANITIDINE HCL 150 MG PO TABS: ORAL_TABLET | ORAL | Status: DC

## 2015-10-21 MED ORDER — CELECOXIB 200 MG PO CAPS: 200.0000 mg | ORAL_CAPSULE | Freq: Every day | ORAL | Status: DC

## 2015-10-21 MED ORDER — PANTOPRAZOLE SODIUM 40 MG PO TBEC: 40.0000 mg | DELAYED_RELEASE_TABLET | Freq: Every day | ORAL | Status: DC

## 2015-10-21 MED ORDER — PRIMIDONE 50 MG PO TABS: ORAL_TABLET | ORAL | Status: DC

## 2015-10-21 MED ORDER — NEBIVOLOL HCL 5 MG PO TABS: 5.0000 mg | ORAL_TABLET | Freq: Every day | ORAL | Status: DC

## 2015-10-21 MED ORDER — KETOCONAZOLE 2 % EX CREA: 1.0000 "application " | TOPICAL_CREAM | Freq: Two times a day (BID) | CUTANEOUS | Status: DC

## 2015-10-21 NOTE — Patient Instructions (Addendum)
   IF you received an x-ray today, you will receive an invoice from [facility]. Please contact [facility] at [phone] with questions or concerns regarding your invoice.   IF you received labwork today, you will receive an invoice from [facility]. Please contact [facility] at [phone] with questions or concerns regarding your invoice.   Our billing staff will not be able to assist you with questions regarding bills from these companies.  You will be contacted with the lab results as soon as they are available. The fastest way to get your results is to activate your My Chart account. Instructions are located on the last page of this paperwork. If you have not heard from us regarding the results in 2 weeks, please contact this office.     Low Back Sprain With Rehab A sprain is an injury in which a ligament is torn. The ligaments of the lower back are vulnerable to sprains. However, they are strong and require great force to be injured. These ligaments are important for stabilizing the spinal column. Sprains are classified into three categories. Grade 1 sprains cause pain, but the tendon is not lengthened. Grade 2 sprains include a lengthened ligament, due to the ligament being stretched or partially ruptured. With grade 2 sprains there is still function, although the function may be decreased. Grade 3 sprains involve a complete tear of the tendon or muscle, and function is usually impaired. SYMPTOMS   Severe pain in the lower back.  Sometimes, a feeling of a "pop," "snap," or tear, at the time of injury.  Tenderness and sometimes swelling at the injury site.  Uncommonly, bruising (contusion) within 48 hours of injury.  Muscle spasms in the back. CAUSES  Low back sprains occur when a force is placed on the ligaments that is greater than they can handle. Common causes of injury include:  Performing a stressful act while  off-balance.  Repetitive stressful activities that involve movement of the lower back.  Direct hit (trauma) to the lower back. RISK INCREASES WITH:  Contact sports (football, wrestling).  Collisions (major skiing accidents).  Sports that require throwing or lifting (baseball, weightlifting).  Sports involving twisting of the spine (gymnastics, diving, tennis, golf).  Poor strength and flexibility.  Inadequate protection.  Previous back injury or surgery (especially fusion). PREVENTION  Wear properly fitted and padded protective equipment.  Warm up and stretch properly before activity.  Allow for adequate recovery between workouts.  Maintain physical fitness:  Strength, flexibility, and endurance.  Cardiovascular fitness.  Maintain a healthy body weight. PROGNOSIS  If treated properly, low back sprains usually heal with non-surgical treatment. The length of time for healing depends on the severity of the injury.  RELATED COMPLICATIONS   Recurring symptoms, resulting in a chronic problem.  Chronic inflammation and pain in the low back.  Delayed healing or resolution of symptoms, especially if activity is resumed too soon.  Prolonged impairment.  Unstable or arthritic joints of the low back. TREATMENT  Treatment first involves the use of ice and medicine, to reduce pain and inflammation. The use of strengthening and stretching exercises may help reduce pain with activity. These exercises may be performed at home or with a therapist. Severe injuries may require referral to a therapist for further evaluation and treatment, such as ultrasound. Your caregiver may advise that you wear a back brace or corset, to help reduce pain and discomfort. Often, prolonged bed rest results in greater harm then benefit. Corticosteroid injections may   be recommended. However, these should be reserved for the most serious cases. It is important to avoid using your back when lifting objects.  At night, sleep on your back on a firm mattress, with a pillow placed under your knees. If non-surgical treatment is unsuccessful, surgery may be needed.  MEDICATION   If pain medicine is needed, nonsteroidal anti-inflammatory medicines (aspirin and ibuprofen), or other minor pain relievers (acetaminophen), are often advised.  Do not take pain medicine for 7 days before surgery.  Prescription pain relievers may be given, if your caregiver thinks they are needed. Use only as directed and only as much as you need.  Ointments applied to the skin may be helpful.  Corticosteroid injections may be given by your caregiver. These injections should be reserved for the most serious cases, because they may only be given a certain number of times. HEAT AND COLD  Cold treatment (icing) should be applied for 10 to 15 minutes every 2 to 3 hours for inflammation and pain, and immediately after activity that aggravates your symptoms. Use ice packs or an ice massage.  Heat treatment may be used before performing stretching and strengthening activities prescribed by your caregiver, physical therapist, or athletic trainer. Use a heat pack or a warm water soak. SEEK MEDICAL CARE IF:   Symptoms get worse or do not improve in 2 to 4 weeks, despite treatment.  You develop numbness or weakness in either leg.  You lose bowel or bladder function.  Any of the following occur after surgery: fever, increased pain, swelling, redness, drainage of fluids, or bleeding in the affected area.  New, unexplained symptoms develop. (Drugs used in treatment may produce side effects.) EXERCISES  RANGE OF MOTION (ROM) AND STRETCHING EXERCISES - Low Back Sprain Most people with lower back pain will find that their symptoms get worse with excessive bending forward (flexion) or arching at the lower back (extension). The exercises that will help resolve your symptoms will focus on the opposite motion.  Your physician, physical  therapist or athletic trainer will help you determine which exercises will be most helpful to resolve your lower back pain. Do not complete any exercises without first consulting with your caregiver. Discontinue any exercises which make your symptoms worse, until you speak to your caregiver. If you have pain, numbness or tingling which travels down into your buttocks, leg or foot, the goal of the therapy is for these symptoms to move closer to your back and eventually resolve. Sometimes, these leg symptoms will get better, but your lower back pain may worsen. This is often an indication of progress in your rehabilitation. Be very alert to any changes in your symptoms and the activities in which you participated in the 24 hours prior to the change. Sharing this information with your caregiver will allow him or her to most efficiently treat your condition. These exercises may help you when beginning to rehabilitate your injury. Your symptoms may resolve with or without further involvement from your physician, physical therapist or athletic trainer. While completing these exercises, remember:   Restoring tissue flexibility helps normal motion to return to the joints. This allows healthier, less painful movement and activity.  An effective stretch should be held for at least 30 seconds.  A stretch should never be painful. You should only feel a gentle lengthening or release in the stretched tissue. FLEXION RANGE OF MOTION AND STRETCHING EXERCISES: STRETCH - Flexion, Single Knee to Chest   Lie on a firm bed or floor with   both legs extended in front of you.  Keeping one leg in contact with the floor, bring your opposite knee to your chest. Hold your leg in place by either grabbing behind your thigh or at your knee.  Pull until you feel a gentle stretch in your low back. Hold __________ seconds.  Slowly release your grasp and repeat the exercise with the opposite side. Repeat __________ times. Complete  this exercise __________ times per day.  STRETCH - Flexion, Double Knee to Chest  Lie on a firm bed or floor with both legs extended in front of you.  Keeping one leg in contact with the floor, bring your opposite knee to your chest.  Tense your stomach muscles to support your back and then lift your other knee to your chest. Hold your legs in place by either grabbing behind your thighs or at your knees.  Pull both knees toward your chest until you feel a gentle stretch in your low back. Hold __________ seconds.  Tense your stomach muscles and slowly return one leg at a time to the floor. Repeat __________ times. Complete this exercise __________ times per day.  STRETCH - Low Trunk Rotation  Lie on a firm bed or floor. Keeping your legs in front of you, bend your knees so they are both pointed toward the ceiling and your feet are flat on the floor.  Extend your arms out to the side. This will stabilize your upper body by keeping your shoulders in contact with the floor.  Gently and slowly drop both knees together to one side until you feel a gentle stretch in your low back. Hold for __________ seconds.  Tense your stomach muscles to support your lower back as you bring your knees back to the starting position. Repeat the exercise to the other side. Repeat __________ times. Complete this exercise __________ times per day  EXTENSION RANGE OF MOTION AND FLEXIBILITY EXERCISES: STRETCH - Extension, Prone on Elbows   Lie on your stomach on the floor, a bed will be too soft. Place your palms about shoulder width apart and at the height of your head.  Place your elbows under your shoulders. If this is too painful, stack pillows under your chest.  Allow your body to relax so that your hips drop lower and make contact more completely with the floor.  Hold this position for __________ seconds.  Slowly return to lying flat on the floor. Repeat __________ times. Complete this exercise  __________ times per day.  RANGE OF MOTION - Extension, Prone Press Ups  Lie on your stomach on the floor, a bed will be too soft. Place your palms about shoulder width apart and at the height of your head.  Keeping your back as relaxed as possible, slowly straighten your elbows while keeping your hips on the floor. You may adjust the placement of your hands to maximize your comfort. As you gain motion, your hands will come more underneath your shoulders.  Hold this position __________ seconds.  Slowly return to lying flat on the floor. Repeat __________ times. Complete this exercise __________ times per day.  RANGE OF MOTION- Quadruped, Neutral Spine   Assume a hands and knees position on a firm surface. Keep your hands under your shoulders and your knees under your hips. You may place padding under your knees for comfort.  Drop your head and point your tailbone toward the ground below you. This will round out your lower back like an angry cat. Hold this position   for __________ seconds.  Slowly lift your head and release your tail bone so that your back sags into a large arch, like an old horse.  Hold this position for __________ seconds.  Repeat this until you feel limber in your low back.  Now, find your "sweet spot." This will be the most comfortable position somewhere between the two previous positions. This is your neutral spine. Once you have found this position, tense your stomach muscles to support your low back.  Hold this position for __________ seconds. Repeat __________ times. Complete this exercise __________ times per day.  STRENGTHENING EXERCISES - Low Back Sprain These exercises may help you when beginning to rehabilitate your injury. These exercises should be done near your "sweet spot." This is the neutral, low-back arch, somewhere between fully rounded and fully arched, that is your least painful position. When performed in this safe range of motion, these exercises  can be used for people who have either a flexion or extension based injury. These exercises may resolve your symptoms with or without further involvement from your physician, physical therapist or athletic trainer. While completing these exercises, remember:   Muscles can gain both the endurance and the strength needed for everyday activities through controlled exercises.  Complete these exercises as instructed by your physician, physical therapist or athletic trainer. Increase the resistance and repetitions only as guided.  You may experience muscle soreness or fatigue, but the pain or discomfort you are trying to eliminate should never worsen during these exercises. If this pain does worsen, stop and make certain you are following the directions exactly. If the pain is still present after adjustments, discontinue the exercise until you can discuss the trouble with your caregiver. STRENGTHENING - Deep Abdominals, Pelvic Tilt   Lie on a firm bed or floor. Keeping your legs in front of you, bend your knees so they are both pointed toward the ceiling and your feet are flat on the floor.  Tense your lower abdominal muscles to press your low back into the floor. This motion will rotate your pelvis so that your tail bone is scooping upwards rather than pointing at your feet or into the floor. With a gentle tension and even breathing, hold this position for __________ seconds. Repeat __________ times. Complete this exercise __________ times per day.  STRENGTHENING - Abdominals, Crunches   Lie on a firm bed or floor. Keeping your legs in front of you, bend your knees so they are both pointed toward the ceiling and your feet are flat on the floor. Cross your arms over your chest.  Slightly tip your chin down without bending your neck.  Tense your abdominals and slowly lift your trunk high enough to just clear your shoulder blades. Lifting higher can put excessive stress on the lower back and does not  further strengthen your abdominal muscles.  Control your return to the starting position. Repeat __________ times. Complete this exercise __________ times per day.  STRENGTHENING - Quadruped, Opposite UE/LE Lift   Assume a hands and knees position on a firm surface. Keep your hands under your shoulders and your knees under your hips. You may place padding under your knees for comfort.  Find your neutral spine and gently tense your abdominal muscles so that you can maintain this position. Your shoulders and hips should form a rectangle that is parallel with the floor and is not twisted.  Keeping your trunk steady, lift your right hand no higher than your shoulder and then your left   leg no higher than your hip. Make sure you are not holding your breath. Hold this position for __________ seconds.  Continuing to keep your abdominal muscles tense and your back steady, slowly return to your starting position. Repeat with the opposite arm and leg. Repeat __________ times. Complete this exercise __________ times per day.  STRENGTHENING - Abdominals and Quadriceps, Straight Leg Raise   Lie on a firm bed or floor with both legs extended in front of you.  Keeping one leg in contact with the floor, bend the other knee so that your foot can rest flat on the floor.  Find your neutral spine, and tense your abdominal muscles to maintain your spinal position throughout the exercise.  Slowly lift your straight leg off the floor about 6 inches for a count of 15, making sure to not hold your breath.  Still keeping your neutral spine, slowly lower your leg all the way to the floor. Repeat this exercise with each leg __________ times. Complete this exercise __________ times per day. POSTURE AND BODY MECHANICS CONSIDERATIONS - Low Back Sprain Keeping correct posture when sitting, standing or completing your activities will reduce the stress put on different body tissues, allowing injured tissues a chance to heal  and limiting painful experiences. The following are general guidelines for improved posture. Your physician or physical therapist will provide you with any instructions specific to your needs. While reading these guidelines, remember:  The exercises prescribed by your provider will help you have the flexibility and strength to maintain correct postures.  The correct posture provides the best environment for your joints to work. All of your joints have less wear and tear when properly supported by a spine with good posture. This means you will experience a healthier, less painful body.  Correct posture must be practiced with all of your activities, especially prolonged sitting and standing. Correct posture is as important when doing repetitive low-stress activities (typing) as it is when doing a single heavy-load activity (lifting). RESTING POSITIONS Consider which positions are most painful for you when choosing a resting position. If you have pain with flexion-based activities (sitting, bending, stooping, squatting), choose a position that allows you to rest in a less flexed posture. You would want to avoid curling into a fetal position on your side. If your pain worsens with extension-based activities (prolonged standing, working overhead), avoid resting in an extended position such as sleeping on your stomach. Most people will find more comfort when they rest with their spine in a more neutral position, neither too rounded nor too arched. Lying on a non-sagging bed on your side with a pillow between your knees, or on your back with a pillow under your knees will often provide some relief. Keep in mind, being in any one position for a prolonged period of time, no matter how correct your posture, can still lead to stiffness. PROPER SITTING POSTURE In order to minimize stress and discomfort on your spine, you must sit with correct posture. Sitting with good posture should be effortless for a healthy body.  Returning to good posture is a gradual process. Many people can work toward this most comfortably by using various supports until they have the flexibility and strength to maintain this posture on their own. When sitting with proper posture, your ears will fall over your shoulders and your shoulders will fall over your hips. You should use the back of the chair to support your upper back. Your lower back will be in a neutral   position, just slightly arched. You may place a small pillow or folded towel at the base of your lower back for  support.  When working at a desk, create an environment that supports good, upright posture. Without extra support, muscles tire, which leads to excessive strain on joints and other tissues. Keep these recommendations in mind: CHAIR:  A chair should be able to slide under your desk when your back makes contact with the back of the chair. This allows you to work closely.  The chair's height should allow your eyes to be level with the upper part of your monitor and your hands to be slightly lower than your elbows. BODY POSITION  Your feet should make contact with the floor. If this is not possible, use a foot rest.  Keep your ears over your shoulders. This will reduce stress on your neck and low back. INCORRECT SITTING POSTURES  If you are feeling tired and unable to assume a healthy sitting posture, do not slouch or slump. This puts excessive strain on your back tissues, causing more damage and pain. Healthier options include:  Using more support, like a lumbar pillow.  Switching tasks to something that requires you to be upright or walking.  Talking a brief walk.  Lying down to rest in a neutral-spine position. PROLONGED STANDING WHILE SLIGHTLY LEANING FORWARD  When completing a task that requires you to lean forward while standing in one place for a long time, place either foot up on a stationary 2-4 inch high object to help maintain the best posture. When  both feet are on the ground, the lower back tends to lose its slight inward curve. If this curve flattens (or becomes too large), then the back and your other joints will experience too much stress, tire more quickly, and can cause pain. CORRECT STANDING POSTURES Proper standing posture should be assumed with all daily activities, even if they only take a few moments, like when brushing your teeth. As in sitting, your ears should fall over your shoulders and your shoulders should fall over your hips. You should keep a slight tension in your abdominal muscles to brace your spine. Your tailbone should point down to the ground, not behind your body, resulting in an over-extended swayback posture.  INCORRECT STANDING POSTURES  Common incorrect standing postures include a forward head, locked knees and/or an excessive swayback. WALKING Walk with an upright posture. Your ears, shoulders and hips should all line-up. PROLONGED ACTIVITY IN A FLEXED POSITION When completing a task that requires you to bend forward at your waist or lean over a low surface, try to find a way to stabilize 3 out of 4 of your limbs. You can place a hand or elbow on your thigh or rest a knee on the surface you are reaching across. This will provide you more stability, so that your muscles do not tire as quickly. By keeping your knees relaxed, or slightly bent, you will also reduce stress across your lower back. CORRECT LIFTING TECHNIQUES DO :  Assume a wide stance. This will provide you more stability and the opportunity to get as close as possible to the object which you are lifting.  Tense your abdominals to brace your spine. Bend at the knees and hips. Keeping your back locked in a neutral-spine position, lift using your leg muscles. Lift with your legs, keeping your back straight.  Test the weight of unknown objects before attempting to lift them.  Try to keep your elbows locked down   at your sides in order get the best  strength from your shoulders when carrying an object.  Always ask for help when lifting heavy or awkward objects. INCORRECT LIFTING TECHNIQUES DO NOT:   Lock your knees when lifting, even if it is a small object.  Bend and twist. Pivot at your feet or move your feet when needing to change directions.  Assume that you can safely pick up even a paperclip without proper posture.   This information is not intended to replace advice given to you by your health care provider. Make sure you discuss any questions you have with your health care provider.   Document Released: 06/05/2005 Document Revised: 06/26/2014 Document Reviewed: 09/17/2008 [employer] Interactive Patient Education 2016 [employer]  

## 2015-10-21 NOTE — Progress Notes (Signed)
Subjective:    Patient ID: Sharon [name], female    DOB: 26-Feb-1966, 50 y.o.   MRN: [id number]  10/21/2015  Medication Refill and Discoloration on her chest   HPI This 50 y.o. female presents for ten month follow-up. Previous patient for Dr. Lorelei Pont.   Last physical:  2016 Pap smear: 10 months ago. Mammogram:  10 months ago. Colonoscopy:  Never; IBS; colonoscopy at age 77.  Anal fissures; hemorrhoid surgery in past as well. TDAP: not sure;  Influenza:  03/2015 Eye exam:  +glasses; one month ago; follow-up in three months; increased pressure in R eye. Dental exam:  Every six months.   DVT/PE: recent chest heaviness; no sputum.  S/p sleep study; snores on L side.  Wake up at night.  No asthma; mother, father, and brother with asthma.  Worried about COPD due to mother's smoking.  Chronic breathing issues for 15 years.  Two blood clots in LLE.    Lower back pain: rx for Celebrex; recommend Tylenol PNR.  Hurting for a long time; no PT or ortho consultation.  R sided pain.  No radiation into legs currently; no n/t/burning in legs.  No weakness.  No saddle paresthesias.    Genital herpes:  Daily acyclovir for prevention; rare outbreaks.   HTN: Patient reports good compliance with medication, good tolerance to medication, and good symptom control.  Bystolic 5mg  daily   GERD:  Zantac 150mg  daily; also takes Protonix qhs.  Constipation: taking mineral oil qhs.  Trial of Colace, Metamucil.  Anal fissures and hemorrhoids: stable currently.  Anxiety/RLS:  Taking Klonopin one qhs; can take 1/2 every morning.  S/p sleep study; no RLS.  Started after death of mother.  Job is so stressful.  Benefits are good.  Does not like change.    Tremors B: Primidone for tremors; started two years ago.  Needs to follow-up with neurology; high deductible.  Charges so much for MRI; requires regular MRIs.  Previously a beutician.    Hyperpigmented lesion: no rash; large area started small and is enlarging.    No itching.  Showed Dr. Everlene Farrier; Dr. Drue Stager might have seen lesions.  No rx in past.  No itching.  No pain.  Worried about skin cancer.    Allergic rhinitis: chronic nasal congestion; intermittent; previously occurred at nighttime.  No claritin or zyrtec due to dry eye syndrome.  Dry ey drops.  Using [facility]; Dr. Lucia Gaskins.    Abdominal tenderness: around hysterectomy site on L side.  No n/v.     Review of Systems  Constitutional: Negative for fever, chills, diaphoresis and fatigue.  HENT: Positive for congestion, postnasal drip and rhinorrhea. Negative for sinus pressure.   Eyes: Negative for visual disturbance.  Respiratory: Negative for cough and shortness of breath.   Cardiovascular: Positive for chest pain. Negative for palpitations and leg swelling.  Gastrointestinal: Positive for constipation. Negative for nausea, vomiting, abdominal pain and diarrhea.  Endocrine: Negative for cold intolerance, heat intolerance, polydipsia, polyphagia and polyuria.  Musculoskeletal: Positive for back pain.  Skin: Positive for color change and rash.  Neurological: Positive for tremors. Negative for dizziness, seizures, syncope, facial asymmetry, speech difficulty, weakness, light-headedness, numbness and headaches.  Psychiatric/Behavioral: Positive for sleep disturbance. Negative for suicidal ideas, self-injury and dysphoric mood. The patient is nervous/anxious.     Past Medical History  Diagnosis Date  . Hypertension   . Pulmonary embolism ([place])   . DVT (deep venous thrombosis) ([facility])     left leg  . Endometriosis   .  Detached retina   . History of hemorrhoids   . Anal fissure   . Blood clot in vein     left leg  . Anxiety   . Heart murmur   . Allergy    Past Surgical History  Procedure Laterality Date  . Tubal ligation    . Inner ear surgery    . Vein surgery  04/2010  . Hemorrhoid surgery    . Retinal detachment surgery    . Incontinence surgery    . Endometrial ablation    . Abdominal  hysterectomy    . Rectal prolapse repair    . Eye surgery     Allergies  Allergen Reactions  . Dextromethorphan Polistirex Er     Pt states caused "a lump" in her throat, making it difficult to swallow  . Hydrocodone Nausea And Vomiting  . Penicillins     Unknown    Current Outpatient Prescriptions  Medication Sig Dispense Refill  . acyclovir (ZOVIRAX) 400 MG tablet Take 1 tablet by mouth daily.    . Ascorbic Acid (VITAMIN C) 1000 MG tablet Take 1,000 mg by mouth daily.    . celecoxib (CELEBREX) 200 MG capsule Take 1 capsule (200 mg total) by mouth daily. 30 capsule 2  . Cholecalciferol (VITAMIN D) 2000 UNITS tablet Take 2,000 Units by mouth daily.    . clonazePAM (KLONOPIN) 0.5 MG tablet TAKE 1 TABLET BY MOUTH AT BEDTIME AND TAKE 1/2 TABLET DURING THE DAY AS NEEDED 60 tablet 5  . nebivolol (BYSTOLIC) 5 MG tablet Take 1 tablet (5 mg total) by mouth daily. 90 tablet 1  . Nitroglycerin (RECTIV) 0.4 % OINT Place 0.4 inches rectally 2 (two) times daily. Apply 1 in of Rectiv intra-anally x 3 weeks. 1 Tube 3  . NON FORMULARY Diltiazem hcl gel 2 % 4 times daily     . pantoprazole (PROTONIX) 40 MG tablet Take 1 tablet (40 mg total) by mouth daily. 90 tablet 3  . primidone (MYSOLINE) 50 MG tablet TAKE 2 TABLETS (100 MG TOTAL) BY MOUTH AT BEDTIME. 180 tablet 1  . ranitidine (ZANTAC) 150 MG tablet Take 2 tablets in the morning as needed for reflux 180 tablet 3  . vitamin B-12 (CYANOCOBALAMIN) 1000 MCG tablet Take 1,000 mcg by mouth daily.    Marland Kitchen ketoconazole (NIZORAL) 2 % cream Apply 1 application topically 2 (two) times daily. 30 g 0   No current facility-administered medications for this visit.   Social History   Social History  . Marital Status: Married    Spouse Name: N/A  . Number of Children: 1  . Years of Education: N/A   Occupational History  . Radio broadcast assistant    Social History Main Topics  . Smoking status: Never Smoker   . Smokeless tobacco: Never Used  . Alcohol Use: Yes       Comment: 2 drinks per wk  . Drug Use: No     Comment: former maryjuana use- quit in 1995  . Sexual Activity: Yes   Other Topics Concern  . Not on file   Social History Narrative   Marital status: divorced x 2; dating seriously x 3 years.  Boyfriend with HIV.       Children:  1 daughter (31); no grandchildren      Lives:  With boyfriend.        Employment: works at [employer]      Tobacco: none      Alcohol: rarely; socially  Exercise: none; has treadmill and elliptical and bikes   Family History  Problem Relation Age of Onset  . Emphysema Mother     smoker  . Allergies Mother   . Asthma Mother   . Heart disease Mother     AMI as cause of death  . COPD Mother   . Asthma Father   . Heart disease Father     AMI as cause of death  . Diabetes Father   . Asthma Brother   . Heart disease Brother 24    heart failure  . Lung cancer Maternal Grandfather     was a smoker  . Cancer Maternal Grandfather     lung  . Heart disease Paternal Grandmother   . Heart disease Paternal Grandfather        Objective:    BP 132/82 mmHg  Pulse 70  Temp(Src) 97.8 F (36.6 C) (Oral)  Resp 16  Ht 5\' 8"  (1.727 m)  Wt 165 lb 12.8 oz (75.206 kg)  BMI 25.22 kg/m2  SpO2 99%  LMP 10/11/2010 Physical Exam  Constitutional: She is oriented to person, place, and time. She appears well-developed and well-nourished. No distress.  HENT:  Head: Normocephalic and atraumatic.  Right Ear: External ear normal.  Left Ear: External ear normal.  Nose: Nose normal.  Mouth/Throat: Oropharynx is clear and moist.  Eyes: Conjunctivae and EOM are normal. Pupils are equal, round, and reactive to light.  Neck: Normal range of motion. Neck supple. Carotid bruit is not present. No thyromegaly present.  Cardiovascular: Normal rate, regular rhythm, normal heart sounds and intact distal pulses.  Exam reveals no gallop and no friction rub.   No murmur heard. Pulmonary/Chest: Effort normal and breath sounds normal.  She has no wheezes. She has no rales.  Abdominal: Soft. Bowel sounds are normal. She exhibits no distension and no mass. There is no tenderness. There is no rebound and no guarding.  Lymphadenopathy:    She has no cervical adenopathy.  Neurological: She is alert and oriented to person, place, and time. No cranial nerve deficit.  Skin: Skin is warm and dry. Rash noted. She is not diaphoretic. No erythema. No pallor.     Psychiatric: She has a normal mood and affect. Her behavior is normal.   Results for orders placed or performed in visit on 10/21/15  Lipid panel  Result Value Ref Range   Cholesterol 153 125 - 200 mg/dL   Triglycerides 55 <150 mg/dL   HDL 62 >=46 mg/dL   Total CHOL/HDL Ratio 2.5 <=5.0 Ratio   VLDL 11 <30 mg/dL   LDL Cholesterol 80 <130 mg/dL  Comprehensive metabolic panel  Result Value Ref Range   Sodium 140 135 - 146 mmol/L   Potassium 4.3 3.5 - 5.3 mmol/L   Chloride 102 98 - 110 mmol/L   CO2 28 20 - 31 mmol/L   Glucose, Bld 89 65 - 99 mg/dL   BUN 17 7 - 25 mg/dL   Creat 0.73 0.50 - 1.05 mg/dL   Total Bilirubin 0.4 0.2 - 1.2 mg/dL   Alkaline Phosphatase 64 33 - 130 U/L   AST 14 10 - 35 U/L   ALT 23 6 - 29 U/L   Total Protein 7.5 6.1 - 8.1 g/dL   Albumin 4.9 3.6 - 5.1 g/dL   Calcium 9.8 8.6 - 10.4 mg/dL  HIV antibody  Result Value Ref Range   HIV 1&2 Ab, 4th Generation NONREACTIVE NONREACTIVE  POCT urinalysis dipstick  Result Value  Ref Range   Color, UA yellow yellow   Clarity, UA clear clear   Glucose, UA negative negative   Bilirubin, UA negative negative   Ketones, POC UA negative negative   Spec Grav, UA 1.025    Blood, UA small (A) negative   pH, UA 5.5    Protein Ur, POC negative negative   Urobilinogen, UA 0.2    Nitrite, UA Negative Negative   Leukocytes, UA Negative Negative  POCT Microscopic Urinalysis ([facility])  Result Value Ref Range   WBC,UR,HPF,POC Few (A) None WBC/hpf   RBC,UR,HPF,POC Few (A) None RBC/hpf   Bacteria Few (A) None, Too  numerous to count   Mucus Present (A) Absent   Epithelial Cells, UR Per Microscopy Few (A) None, Too numerous to count cells/hpf  POCT CBC  Result Value Ref Range   WBC 8.7 4.6 - 10.2 K/uL   Lymph, poc 2.0 0.6 - 3.4   POC LYMPH PERCENT 23.0 10 - 50 %L   MID (cbc) 0.7 0 - 0.9   POC MID % 7.7 0 - 12 %M   POC Granulocyte 6.0 2 - 6.9   Granulocyte percent 69.3 37 - 80 %G   RBC 4.26 4.04 - 5.48 M/uL   Hemoglobin 13.1 12.2 - 16.2 g/dL   HCT, POC 37.3 (A) 37.7 - 47.9 %   MCV 87.7 80 - 97 fL   MCH, POC 30.7 27 - 31.2 pg   MCHC 35.0 31.8 - 35.4 g/dL   RDW, POC 12.9 %   Platelet Count, POC 288 142 - 424 K/uL   MPV 7.6 0 - 99.8 fL   Dg Chest 2 View  10/21/2015  CLINICAL DATA:  Chest tightness EXAM: CHEST  2 VIEW COMPARISON:  Chest CT- 02/09/2010 FINDINGS: Normal cardiac silhouette and mediastinal contours. No focal airspace opacities. No pleural effusion or pneumothorax. No evidence of edema. No acute osseous abnormalities. IMPRESSION: No acute cardiopulmonary disease. Electronically Signed   By: Sandi Mariscal M.D.   On: 10/21/2015 19:38      Assessment & Plan:   1. Shortness of breath   2. Chest tightness   3. Essential hypertension, benign   4. Anxiety state   5. Gastroesophageal reflux disease without esophagitis   6. Essential hypertension   7. Neck pain   8. Bilateral low back pain without sciatica   9. Tinea corporis   10. Screening for HIV (human immunodeficiency virus)   11. Colon cancer screening    -obtain CXR and EKG, labs due to intermittent atypical chest pain and SOB.  Benign exam in office; history of DVT in the past yet Hommen's negative and no leg swelling. -obtain HIV as boyfriend is HIV + -refill of medications provided. -treat annular rash with Ketoconazole. -pt refused referral to cardiology; pt also refused CT chest angio to evaluate for PE; financial restrictions limiting further work up.  Pt agreeable to ED evaluation if symptoms of chest pressure and SOB acutely  worsen. -refer to GI for colonoscopy.   Orders Placed This Encounter  Procedures  . DG Chest 2 View    Standing Status: Future     Number of Occurrences: 1     Standing Expiration Date: 10/20/2016    Order Specific Question:  Reason for Exam (SYMPTOM  OR DIAGNOSIS REQUIRED)    Answer:  chest tightness    Order Specific Question:  Is the patient pregnant?    Answer:  No    Order Specific Question:  Preferred imaging location?  Answer:  External  . Lipid panel    Order Specific Question:  Has the patient fasted?    Answer:  Yes  . Comprehensive metabolic panel    Order Specific Question:  Has the patient fasted?    Answer:  Yes  . HIV antibody  . Ambulatory referral to Gastroenterology    Referral Priority:  Routine    Referral Type:  Consultation    Referral Reason:  Specialty Services Required    Number of Visits Requested:  1  . POCT urinalysis dipstick  . POCT Microscopic Urinalysis ([facility])  . POCT CBC  . EKG 12-Lead   Meds ordered this encounter  Medications  . ketoconazole (NIZORAL) 2 % cream    Sig: Apply 1 application topically 2 (two) times daily.    Dispense:  30 g    Refill:  0  . clonazePAM (KLONOPIN) 0.5 MG tablet    Sig: TAKE 1 TABLET BY MOUTH AT BEDTIME AND TAKE 1/2 TABLET DURING THE DAY AS NEEDED    Dispense:  60 tablet    Refill:  5    This request is for a new prescription for a controlled substance as required by Federal/State law.  . ranitidine (ZANTAC) 150 MG tablet    Sig: Take 2 tablets in the morning as needed for reflux    Dispense:  180 tablet    Refill:  3  . primidone (MYSOLINE) 50 MG tablet    Sig: TAKE 2 TABLETS (100 MG TOTAL) BY MOUTH AT BEDTIME.    Dispense:  180 tablet    Refill:  1  . pantoprazole (PROTONIX) 40 MG tablet    Sig: Take 1 tablet (40 mg total) by mouth daily.    Dispense:  90 tablet    Refill:  3  . nebivolol (BYSTOLIC) 5 MG tablet    Sig: Take 1 tablet (5 mg total) by mouth daily.    Dispense:  90 tablet     Refill:  1  . celecoxib (CELEBREX) 200 MG capsule    Sig: Take 1 capsule (200 mg total) by mouth daily.    Dispense:  30 capsule    Refill:  2    Return in about 6 months (around 04/22/2016) for recheck high blood pressure.    Rayshun Kandler Elayne Guerin, M.D. [facility] [address] [place], [place]  [address] [phone] phone [phone] fax

## 2015-10-22 ENCOUNTER — Encounter: Payer: Self-pay | Admitting: Gastroenterology

## 2015-10-22 LAB — COMPREHENSIVE METABOLIC PANEL
ALBUMIN: 4.9 g/dL (ref 3.6–5.1)
ALK PHOS: 64 U/L (ref 33–130)
ALT: 23 U/L (ref 6–29)
AST: 14 U/L (ref 10–35)
BILIRUBIN TOTAL: 0.4 mg/dL (ref 0.2–1.2)
BUN: 17 mg/dL (ref 7–25)
CALCIUM: 9.8 mg/dL (ref 8.6–10.4)
CO2: 28 mmol/L (ref 20–31)
CREATININE: 0.73 mg/dL (ref 0.50–1.05)
Chloride: 102 mmol/L (ref 98–110)
GLUCOSE: 89 mg/dL (ref 65–99)
Potassium: 4.3 mmol/L (ref 3.5–5.3)
SODIUM: 140 mmol/L (ref 135–146)
Total Protein: 7.5 g/dL (ref 6.1–8.1)

## 2015-10-22 LAB — LIPID PANEL
CHOLESTEROL: 153 mg/dL (ref 125–200)
HDL: 62 mg/dL (ref >=46)
LDL Cholesterol: 80 mg/dL (ref <130)
Total CHOL/HDL Ratio: 2.5 Ratio (ref <=5.0)
Triglycerides: 55 mg/dL (ref <150)
VLDL: 11 mg/dL (ref <30)

## 2015-10-23 LAB — HIV ANTIBODY (ROUTINE TESTING W REFLEX): HIV 1&2 Ab, 4th Generation: NONREACTIVE

## 2015-12-03 ENCOUNTER — Telehealth: Payer: Self-pay

## 2015-12-03 DIAGNOSIS — R3129 Other microscopic hematuria: Secondary | ICD-10-CM

## 2015-12-03 NOTE — Telephone Encounter (Signed)
Patient is returning phone call. I didn't see any documentation of someone contacting the patient. [phone]. Patient stated she does not have service while she is at work. Please leave detailed message on voicemail.

## 2015-12-03 NOTE — Telephone Encounter (Signed)
Notes Recorded by Carmela Hurt, Rad Tech on 12/02/2015 at 11:38 AM Attempted to call pt, left VM for pt to call back asap  Notes Recorded by Niger J Simpson, CMA on 11/22/2015 at 9:59 AM Left message for pt to call back to inform us of which urologist performed her bladder sling

## 2015-12-03 NOTE — Telephone Encounter (Signed)
Noted.  Since gynecologist performed her bladder sling, I would recommend evaluation by a urologist. I have placed referral to urology; please advise patient.

## 2015-12-03 NOTE — Telephone Encounter (Signed)
Called and left message to advise who performed her bladder sling.

## 2015-12-03 NOTE — Telephone Encounter (Signed)
Patient stated her gyn placed the bladder sling (Dr. Gertie Fey) at [facility].

## 2015-12-04 NOTE — Telephone Encounter (Signed)
Called and spoke with patient, informed her referral was placed with Urology. Pt verbalized understanding.

## 2015-12-24 ENCOUNTER — Ambulatory Visit (AMBULATORY_SURGERY_CENTER): Payer: Self-pay | Admitting: *Deleted

## 2015-12-24 VITALS — Ht 68.0 in | Wt 166.2 lb

## 2015-12-24 DIAGNOSIS — Z1211 Encounter for screening for malignant neoplasm of colon: Secondary | ICD-10-CM

## 2015-12-24 MED ORDER — NA SULFATE-K SULFATE-MG SULF 17.5-3.13-1.6 GM/177ML PO SOLN: ORAL | Status: DC

## 2015-12-24 NOTE — Progress Notes (Signed)
No allergies to eggs or soy.nausea and vomiting with anesthesia.  No oxygen use  No diet drug use

## 2015-12-31 ENCOUNTER — Encounter: Payer: Self-pay | Admitting: Gastroenterology

## 2016-01-07 ENCOUNTER — Encounter: Payer: Self-pay | Admitting: Gastroenterology

## 2016-01-07 ENCOUNTER — Telehealth: Payer: Self-pay | Admitting: Gastroenterology

## 2016-01-07 ENCOUNTER — Ambulatory Visit (AMBULATORY_SURGERY_CENTER): Payer: PRIVATE HEALTH INSURANCE | Admitting: Gastroenterology

## 2016-01-07 VITALS — BP 124/76 | HR 65 | Temp 98.4°F | Resp 12 | Ht 68.0 in | Wt 166.0 lb

## 2016-01-07 DIAGNOSIS — D120 Benign neoplasm of cecum: Secondary | ICD-10-CM

## 2016-01-07 DIAGNOSIS — K635 Polyp of colon: Secondary | ICD-10-CM | POA: Diagnosis not present

## 2016-01-07 DIAGNOSIS — Z1211 Encounter for screening for malignant neoplasm of colon: Secondary | ICD-10-CM | POA: Diagnosis not present

## 2016-01-07 MED ORDER — SODIUM CHLORIDE 0.9 % IV SOLN: 500.0000 mL | INTRAVENOUS | Status: DC

## 2016-01-07 NOTE — Op Note (Signed)
[place] [facility] Patient Name: Sharon [name] Procedure Date: 01/07/2016 2:23 PM MRN: [id number] Endoscopist: Mallie Mussel L. Loletha Carrow , MD Age: 50 Referring MD:  Date of Birth: 1966-03-15 Gender: Female Account #: [id number] Procedure:                Colonoscopy Indications:              Screening for colorectal malignant neoplasm, This                            is the patient's first colonoscopy, Incidental                            constipation noted Medicines:                Monitored Anesthesia Care Procedure:                Pre-Anesthesia Assessment:                           - Prior to the procedure, a History and Physical                            was performed, and patient medications and                            allergies were reviewed. The patient's tolerance of                            previous anesthesia was also reviewed. The risks                            and benefits of the procedure and the sedation                            options and risks were discussed with the patient.                            All questions were answered, and informed consent                            was obtained. Prior Anticoagulants: The patient has                            taken no previous anticoagulant or antiplatelet                            agents. ASA Grade Assessment: II - A patient with                            mild systemic disease. After reviewing the risks                            and benefits, the patient was deemed in  satisfactory condition to undergo the procedure.                           After obtaining informed consent, the colonoscope                            was passed under direct vision. Throughout the                            procedure, the patient's blood pressure, pulse, and                            oxygen saturations were monitored continuously. The                            Model CF-HQ190L [id number]) scope was  introduced                            through the anus and advanced to the the cecum,                            identified by appendiceal orifice and ileocecal                            valve. Due to technical problems, photographs could                            not be obtained nor withdrawal time calculated. The                            colonoscopy was performed without difficulty. The                            patient tolerated the procedure well. The quality                            of the bowel preparation was excellent. The bowel                            preparation used was SUPREP. Scope In: Scope Out: Findings:                 The perianal exam findings include small,                            non-thrombosed external hemorrhoids.                           Multiple diverticula were found in the sigmoid                            colon and ascending colon.                           A 2 mm polyp was found in the cecum.  The polyp was                            sessile. The polyp was removed with a piecemeal                            technique using a cold biopsy forceps. Resection                            and retrieval were complete.                           Retroflexion in the rectum was not performed due to                            anatomy. There was evidence of prior stapled                            hemorrhoidectomy. Complications:            No immediate complications. Estimated Blood Loss:     Estimated blood loss: none. Impression:               - Non-thrombosed external hemorrhoids found on                            perianal exam.                           - Diverticulosis in the sigmoid colon and in the                            ascending colon.                           - One 2 mm polyp in the cecum, removed piecemeal                            using a cold biopsy forceps. Resected and retrieved. Recommendation:           - Patient has a contact number  available for                            emergencies. The signs and symptoms of potential                            delayed complications were discussed with the                            patient. Return to normal activities tomorrow.                            Written discharge instructions were provided to the                            patient.                           -  Resume previous diet.                           - Continue present medications.                           - Await pathology results.                           - Repeat colonoscopy is recommended for                            surveillance. The colonoscopy date will be                            determined after pathology results from today's                            exam become available for review.                           - Samples of Linzess 145 micrograms , one tablet                            once daily for 7 days. Henry L. Loletha Carrow, MD 01/07/2016 5:43:16 PM This report has been signed electronically.

## 2016-01-07 NOTE — Progress Notes (Signed)
Procedure:   Colonoscopy with biopsy  Meds:   Mac  Indication:  Average risk colon cancer screening  Quality of preparation:  Excellent(Suprep)  Findings:   2 mm sessile cecal polyp removed in 2 pieces with a cold biopsy forceps Diverticulosis in both the right and left colon Retroflexion was not performed in the rectum due to the anatomy. There was evidence of prior stapled hemorrhoidectomy but also small external hemorrhoids. No anal fissure  Impression:  Colon polyp Diverticulosis External hemorrhoid  Recommendations:  Follow up pathology, repeat colonoscopy interval dependent upon pathology results.   Dorna Leitz GI Pager [phone]

## 2016-01-07 NOTE — Progress Notes (Signed)
Report to PACU, RN, vss, BBS= Clear.  

## 2016-01-07 NOTE — Telephone Encounter (Signed)
This patient complained of chronic constipation during her colonoscopy visit today.  Please ask her to come to the clinic when we have some samples of Linzess 145 micrograms and take one tablet once daily for 7 days to see if it helps.  Let us know so we can decide whether to send a prescription , and if that dose seemed to be right.

## 2016-01-07 NOTE — Patient Instructions (Signed)
YOU HAD AN ENDOSCOPIC PROCEDURE TODAY AT THE [facility]:   Refer to the procedure report that was given to you for any specific questions about what was found during the examination.  If the procedure report does not answer your questions, please call your gastroenterologist to clarify.  If you requested that your care partner not be given the details of your procedure findings, then the procedure report has been included in a sealed envelope for you to review at your convenience later.  YOU SHOULD EXPECT: Some feelings of bloating in the abdomen. Passage of more gas than usual.  Walking can help get rid of the air that was put into your GI tract during the procedure and reduce the bloating. If you had a lower endoscopy (such as a colonoscopy or flexible sigmoidoscopy) you may notice spotting of blood in your stool or on the toilet paper. If you underwent a bowel prep for your procedure, you may not have a normal bowel movement for a few days.  Please Note:  You might notice some irritation and congestion in your nose or some drainage.  This is from the oxygen used during your procedure.  There is no need for concern and it should clear up in a day or so.  SYMPTOMS TO REPORT IMMEDIATELY:   Following lower endoscopy (colonoscopy or flexible sigmoidoscopy):  Excessive amounts of blood in the stool  Significant tenderness or worsening of abdominal pains  Swelling of the abdomen that is new, acute  Fever of 100F or higher  For urgent or emergent issues, a gastroenterologist can be reached at any hour by calling [phone].   DIET: Your first meal following the procedure should be a small meal and then it is ok to progress to your normal diet. Heavy or fried foods are harder to digest and may make you feel nauseous or bloated.  Likewise, meals heavy in dairy and vegetables can increase bloating.  Drink plenty of fluids but you should avoid alcoholic beverages for 24 hours. Try to  increase the fiber in your diet, and drink plenty of water.  ACTIVITY:  You should plan to take it easy for the rest of today and you should NOT DRIVE or use heavy machinery until tomorrow (because of the sedation medicines used during the test).    FOLLOW UP: Our staff will call the number listed on your records the next business day following your procedure to check on you and address any questions or concerns that you may have regarding the information given to you following your procedure. If we do not reach you, we will leave a message.  However, if you are feeling well and you are not experiencing any problems, there is no need to return our call.  We will assume that you have returned to your regular daily activities without incident.  If any biopsies were taken you will be contacted by phone or by letter within the next 1-3 weeks.  Please call us at [phone] if you have not heard about the biopsies in 3 weeks.    SIGNATURES/CONFIDENTIALITY: You and/or your care partner have signed paperwork which will be entered into your electronic medical record.  These signatures attest to the fact that that the information above on your After Visit Summary has been reviewed and is understood.  Full responsibility of the confidentiality of this discharge information lies with you and/or your care-partner.  Read all of the handouts given to you by your recovery room   nurse. 

## 2016-01-07 NOTE — Progress Notes (Signed)
Called to room to assist during endoscopic procedure.  Patient ID and intended procedure confirmed with present staff. Received instructions for my participation in the procedure from the performing physician.  

## 2016-01-10 ENCOUNTER — Telehealth: Payer: Self-pay | Admitting: *Deleted

## 2016-01-10 NOTE — Telephone Encounter (Signed)
  Follow up Call-  Call back number 01/07/2016  Post procedure Call Back phone  # [phone]  Permission to leave phone message Yes  Some recent data might be hidden     Patient questions:  Do you have a fever, pain , or abdominal swelling? No. Pain Score  0 *  Have you tolerated food without any problems? Yes.    Have you been able to return to your normal activities? Yes.    Do you have any questions about your discharge instructions: Diet   No. Medications  No. Follow up visit  No.  Do you have questions or concerns about your Care? No.  Actions: * If pain score is 4 or above: No action needed, pain <4.

## 2016-01-10 NOTE — Telephone Encounter (Signed)
Pt returned call. She will come to the office to pick up the 7 days worth of samples.

## 2016-01-10 NOTE — Telephone Encounter (Signed)
Left message to return call 

## 2016-01-18 ENCOUNTER — Encounter: Payer: Self-pay | Admitting: Gastroenterology

## 2016-02-04 ENCOUNTER — Ambulatory Visit (INDEPENDENT_AMBULATORY_CARE_PROVIDER_SITE_OTHER): Payer: PRIVATE HEALTH INSURANCE | Admitting: Family Medicine

## 2016-02-04 ENCOUNTER — Ambulatory Visit (INDEPENDENT_AMBULATORY_CARE_PROVIDER_SITE_OTHER): Payer: PRIVATE HEALTH INSURANCE

## 2016-02-04 VITALS — BP 111/70 | HR 76 | Temp 99.3°F | Resp 16 | Ht 68.0 in | Wt 162.8 lb

## 2016-02-04 DIAGNOSIS — L729 Follicular cyst of the skin and subcutaneous tissue, unspecified: Secondary | ICD-10-CM | POA: Diagnosis not present

## 2016-02-04 DIAGNOSIS — M25521 Pain in right elbow: Secondary | ICD-10-CM | POA: Diagnosis not present

## 2016-02-04 DIAGNOSIS — F411 Generalized anxiety disorder: Secondary | ICD-10-CM | POA: Diagnosis not present

## 2016-02-04 DIAGNOSIS — J029 Acute pharyngitis, unspecified: Secondary | ICD-10-CM

## 2016-02-04 DIAGNOSIS — L821 Other seborrheic keratosis: Secondary | ICD-10-CM | POA: Diagnosis not present

## 2016-02-04 DIAGNOSIS — M7711 Lateral epicondylitis, right elbow: Secondary | ICD-10-CM | POA: Diagnosis not present

## 2016-02-04 LAB — POCT RAPID STREP A (OFFICE): RAPID STREP A SCREEN: NEGATIVE

## 2016-02-04 NOTE — Progress Notes (Signed)
By signing my name below, I, Mesha Guinyard, attest that this documentation has been prepared under the direction and in the presence of Reginia Forts, MD.  Electronically Signed: Verlee Monte, Medical Scribe. 02/04/2016. 4:35 PM.  Subjective:    Patient ID: Sharon [name], female    DOB: 05/30/1966, 50 y.o.   MRN: [id number]  02/04/2016  Arm Pain (right arm pain x 6 weeks, ran into doggy gate at home )   HPI  HPI Comments: Sharon [name] is a 50 y.o. female who presents to the [facility] complaining of right arm pain onset 6 weeks ago when pt got her arm jammed into a metal doggy gate. Pt's arm bruised afterwards. Pt reports sleep disturbance due to pain occurring with laying down, and pt wakes up crying at 3-4am in the morning. Pt can no longer lift heavy items with her arm, and she can feel a lump in her arm. Pt used ice, wrapped it with an ACE bandage, and 3 ibuprofen BID with no relief to her symptoms. Pt denies numbness, tingling, and burning in her right arm.  Nodules: Pt reports nodules on her right dorsal hand, and in her scalp. Pt states she picks at the nodule on her head when she feels them.    Anxiety: Pt would like to get a refill on Klonopin. Pt mentions she tired with all the changes from work, and the new management.   Sore throat: onset two days ago. No fever/chills/sweats.  +HA.  No ear pain.  +ST with swallowing. +rhinorrhea; +nasal congestion. +some coughing.  No sputum production. No n/v/d.   Review of Systems  Constitutional: Negative for chills, diaphoresis, fatigue and fever.  HENT: Positive for congestion, rhinorrhea, sore throat and trouble swallowing. Negative for ear pain, sneezing and voice change.   Respiratory: Positive for cough. Negative for shortness of breath and wheezing.   Gastrointestinal: Negative for diarrhea, nausea and vomiting.  Musculoskeletal: Positive for arthralgias and myalgias. Negative for joint swelling, neck  pain and neck stiffness.       Right arm pain  Neurological: Negative for weakness and numbness.       Negative burning sensation Negative tingling  Psychiatric/Behavioral: Positive for sleep disturbance. Negative for dysphoric mood. The patient is nervous/anxious.     Past Medical History:  Diagnosis Date  . Allergy   . Anal fissure   . Anxiety   . Arthritis   . Blood clot in vein    left leg  . Detached retina   . DVT (deep venous thrombosis) (HCC)    left leg  . Endometriosis   . GERD (gastroesophageal reflux disease)   . Heart murmur   . History of hemorrhoids   . Hypertension   . Pulmonary embolism [facility])    Past Surgical History:  Procedure Laterality Date  . ABDOMINAL HYSTERECTOMY    . CATARACT EXTRACTION Bilateral 2015  . ENDOMETRIAL ABLATION    . [place]  2011  . INCONTINENCE SURGERY    . INNER EAR SURGERY    . RECTAL PROLAPSE REPAIR    . RETINAL DETACHMENT SURGERY Bilateral 2000  . TUBAL LIGATION    . VEIN SURGERY Left 04/2010   Allergies  Allergen Reactions  . Dextromethorphan Polistirex Er     Pt states caused "a lump" in her throat, making it difficult to swallow  . Hydrocodone Nausea And Vomiting  . Penicillins     Unknown: as child    Social History  Social History  . Marital status: Divorced    Spouse name: N/A  . Number of children: 1  . Years of education: N/A   Occupational History  . 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 Smoking status: Never Smoker  . Smokeless tobacco: Never Used  . Alcohol use 1.2 oz/week    2 Glasses of wine per week  . Drug use: No     Comment: former maryjuana use- quit in 1995  . Sexual activity: Yes   Other Topics Concern  . Not on file   Social History Narrative   Marital status: divorced x 2; dating seriously x 3 years.  Boyfriend with HIV.       Children:  1 daughter (70); no grandchildren      Lives:  With boyfriend.        Employment: works  at [employer]      Tobacco: none      Alcohol: rarely; socially      Exercise: none; has treadmill and elliptical and bikes   Family History  Problem Relation Age of Onset  . Emphysema Mother     smoker  . Allergies Mother   . Asthma Mother   . Heart disease Mother     AMI as cause of death  . COPD Mother   . Asthma Father   . Heart disease Father     AMI as cause of death  . Diabetes Father   . Asthma Brother   . Heart disease Brother 59    heart failure  . Lung cancer Maternal Grandfather     was a smoker  . Cancer Maternal Grandfather     lung  . Heart disease Paternal Grandmother   . Heart disease Paternal Grandfather   . Colon cancer Neg Hx        Objective:    BP 111/70 (BP Location: Left Arm, Patient Position: Sitting, Cuff Size: Small)   Pulse 76   Temp 99.3 F (37.4 C) (Oral)   Resp 16   Ht 5\' 8"  (1.727 m)   Wt 162 lb 12.8 oz (73.8 kg)   LMP 10/11/2010   SpO2 100%   BMI 24.75 kg/m  Physical Exam  Constitutional: She is oriented to person, place, and time. She appears well-developed and well-nourished. No distress.  HENT:  Head: Normocephalic and atraumatic.  Right Ear: Hearing, tympanic membrane, external ear and ear canal normal.  Left Ear: Hearing, tympanic membrane, external ear and ear canal normal.  Mouth/Throat: Uvula is midline and mucous membranes are normal. Posterior oropharyngeal erythema present. No oropharyngeal exudate, posterior oropharyngeal edema or tonsillar abscesses.  Eyes: Conjunctivae are normal. Pupils are equal, round, and reactive to light.  Neck: Normal range of motion. Neck supple. No thyromegaly present.  Cardiovascular: Normal rate, regular rhythm and normal heart sounds.  Exam reveals no gallop and no friction rub.   No murmur heard. Pulmonary/Chest: Effort normal and breath sounds normal. No respiratory distress. She has no wheezes. She has no rales.  Musculoskeletal:       Right elbow: She exhibits normal range of motion, no  swelling and no effusion. Tenderness found. Lateral epicondyle tenderness noted. No radial head, no medial epicondyle and no olecranon process tenderness noted.       Right wrist: Normal. She exhibits normal range of motion, no tenderness and no bony tenderness.       Right forearm: Normal. She exhibits no tenderness, no bony  tenderness, no swelling, no edema and no deformity.  Right arm: Pain with supination TTP on the lateral epicondyle Pain with flexion and extension Full extension with her right arm, but there was some hesitation  Lymphadenopathy:    She has no cervical adenopathy.  Neurological: She is alert and oriented to person, place, and time.  Skin: Skin is warm and dry. She is not diaphoretic.  Right dorsal hand 4 mm mobile cystic lesion 4 papule lesions measuring 53mm on her hair line on the right  Psychiatric: She has a normal mood and affect. Her behavior is normal.  Nursing note and vitals reviewed.  Results for orders placed or performed in visit on 02/04/16  Culture, Group A Strep  Result Value Ref Range   Organism ID, Bacteria Normal Upper Respiratory Flora    Organism ID, Bacteria No Beta Hemolytic Streptococci Isolated   POCT rapid strep A  Result Value Ref Range   Rapid Strep A Screen Negative Negative   No results found. Assessment & Plan:   1. Right elbow pain   2. Skin cyst   3. Seborrheic keratoses   4. Right lateral epicondylitis   5. Sore throat   6. Generalized anxiety disorder      -supportive care for lateral epicondylitis. Recommend wearing wrist splint during the day; home exercise program; icing daily. -supportive care for viral URI; recommend rest, fluids, Tylenol and/or Ibuprofen. RTC inability to swallow. -cystic lesion along hand; no intervention warranted. -several seborrhea keratoses; reassurance provided; recommend dermatology evaluation every 2-3 years.   Orders Placed This Encounter  Procedures  . Culture, Group A Strep    Order  Specific Question:   Source    Answer:   oropharynx  . DG ELBOW COMPLETE RIGHT (3+VIEW)    Standing Status:   Future    Number of Occurrences:   1    Standing Expiration Date:   02/03/2017    Order Specific Question:   Reason for Exam (SYMPTOM  OR DIAGNOSIS REQUIRED)    Answer:   R lateral epicondyle pain for six weeks after hitting dog gate    Order Specific Question:   Is the patient pregnant?    Answer:   No    Comments:   hysterectomy    Order Specific Question:   Preferred imaging location?    Answer:   External  . POCT rapid strep A   No orders of the defined types were placed in this encounter.   No Follow-up on file.  I personally performed the services described in this documentation, which was scribed in my presence. The recorded information has been reviewed and considered.  Ashar Lewinski Elayne Guerin, M.D. [facility] [address] [place],   [address] [phone] phone [phone] fax

## 2016-02-04 NOTE — Patient Instructions (Addendum)
IF you received an x-ray today, you will receive an invoice from [facility]. Please contact [facility] at [phone] with questions or concerns regarding your invoice.   IF you received labwork today, you will receive an invoice from [employer]. Please contact [facility] at [phone] with questions or concerns regarding your invoice.   Our billing staff will not be able to assist you with questions regarding bills from these companies.  You will be contacted with the lab results as soon as they are available. The fastest way to get your results is to activate your My Chart account. Instructions are located on the last page of this paperwork. If you have not heard from [place] regarding the results in 2 weeks, please contact this office.      Lateral Epicondylitis With Rehab Lateral epicondylitis involves inflammation and pain around the outer portion of the elbow. The pain is caused by inflammation of the tendons in the forearm that bring back (extend) the wrist. Lateral epicondylitis is also called tennis elbow, because it is very common in tennis players. However, it may occur in any individual who extends the wrist repetitively. If lateral epicondylitis is left untreated, it may become a chronic problem. SYMPTOMS   Pain, tenderness, and inflammation on the outer (lateral) side of the elbow.  Pain or weakness with gripping activities.  Pain that increases with wrist-twisting motions (playing tennis, using a screwdriver, opening a door or a jar).  Pain with lifting objects, including a coffee cup. CAUSES  Lateral epicondylitis is caused by inflammation of the tendons that extend the wrist. Causes of injury may include:  Repetitive stress and strain on the muscles and tendons that extend the wrist.  Sudden change in activity level or intensity.  Incorrect grip in racquet sports.  Incorrect grip size of racquet (often too  large).  Incorrect hitting position or technique (usually backhand, leading with the elbow).  Using a racket that is too heavy. RISK INCREASES WITH:  Sports or occupations that require repetitive and/or strenuous forearm and wrist movements (tennis, squash, racquetball, carpentry).  Poor wrist and forearm strength and flexibility.  Failure to warm up properly before activity.  Resuming activity before healing, rehabilitation, and conditioning are complete. PREVENTION   Warm up and stretch properly before activity.  Maintain physical fitness:  Strength, flexibility, and endurance.  Cardiovascular fitness.  Wear and use properly fitted equipment.  Learn and use proper technique and have a coach correct improper technique.  Wear a tennis elbow (counterforce) brace. PROGNOSIS  The course of this condition depends on the degree of the injury. If treated properly, acute cases (symptoms lasting less than 4 weeks) are often resolved in 2 to 6 weeks. Chronic (longer lasting cases) often resolve in 3 to 6 months but may require physical therapy. RELATED COMPLICATIONS   Frequently recurring symptoms, resulting in a chronic problem. Properly treating the problem the first time decreases frequency of recurrence.  Chronic inflammation, scarring tendon degeneration, and partial tendon tear, requiring surgery.  Delayed healing or resolution of symptoms. TREATMENT  Treatment first involves the use of ice and medicine to reduce pain and inflammation. Strengthening and stretching exercises may help reduce discomfort if performed regularly. These exercises may be performed at home if the condition is an acute injury. Chronic cases may require a referral to a physical therapist for evaluation and treatment. Your caregiver may advise a corticosteroid injection to help reduce inflammation. Rarely, surgery is needed. MEDICATION  If pain  medicine is needed, nonsteroidal anti-inflammatory medicines  (aspirin and ibuprofen), or other minor pain relievers (acetaminophen), are often advised.  Do not take pain medicine for 7 days before surgery.  Prescription pain relievers may be given, if your caregiver thinks they are needed. Use only as directed and only as much as you need.  Corticosteroid injections may be recommended. These injections should be reserved only for the most severe cases, because they can only be given a certain number of times. HEAT AND COLD  Cold treatment (icing) should be applied for 10 to 15 minutes every 2 to 3 hours for inflammation and pain, and immediately after activity that aggravates your symptoms. Use ice packs or an ice massage.  Heat treatment may be used before performing stretching and strengthening activities prescribed by your caregiver, physical therapist, or athletic trainer. Use a heat Moriya Mitchell or a warm water soak. SEEK MEDICAL CARE IF: Symptoms get worse or do not improve in 2 weeks, despite treatment. EXERCISES  RANGE OF MOTION (ROM) AND STRETCHING EXERCISES - Epicondylitis, Lateral (Tennis Elbow) These exercises may help you when beginning to rehabilitate your injury. Your symptoms may go away with or without further involvement from your physician, physical therapist, or athletic trainer. While completing these exercises, remember:   Restoring tissue flexibility helps normal motion to return to the joints. This allows healthier, less painful movement and activity.  An effective stretch should be held for at least 30 seconds.  A stretch should never be painful. You should only feel a gentle lengthening or release in the stretched tissue. RANGE OF MOTION - Wrist Flexion, Active-Assisted  Extend your right / left elbow with your fingers pointing down.*  Gently pull the back of your hand towards you, until you feel a gentle stretch on the top of your forearm.  Hold this position for __________ seconds. Repeat __________ times. Complete this  exercise __________ times per day.  *If directed by your physician, physical therapist or athletic trainer, complete this stretch with your elbow bent, rather than extended. RANGE OF MOTION - Wrist Extension, Active-Assisted  Extend your right / left elbow and turn your palm upwards.*  Gently pull your palm and fingertips back, so your wrist extends and your fingers point more toward the ground.  You should feel a gentle stretch on the inside of your forearm.  Hold this position for __________ seconds. Repeat __________ times. Complete this exercise __________ times per day. *If directed by your physician, physical therapist or athletic trainer, complete this stretch with your elbow bent, rather than extended. STRETCH - Wrist Flexion  Place the back of your right / left hand on a tabletop, leaving your elbow slightly bent. Your fingers should point away from your body.  Gently press the back of your hand down onto the table by straightening your elbow. You should feel a stretch on the top of your forearm.  Hold this position for __________ seconds. Repeat __________ times. Complete this stretch __________ times per day.  STRETCH - Wrist Extension   Place your right / left fingertips on a tabletop, leaving your elbow slightly bent. Your fingers should point backwards.  Gently press your fingers and palm down onto the table by straightening your elbow. You should feel a stretch on the inside of your forearm.  Hold this position for __________ seconds. Repeat __________ times. Complete this stretch __________ times per day.  STRENGTHENING EXERCISES - Epicondylitis, Lateral (Tennis Elbow) These exercises may help you when beginning to rehabilitate your injury.  They may resolve your symptoms with or without further involvement from your physician, physical therapist, or athletic trainer. While completing these exercises, remember:   Muscles can gain both the endurance and the strength  needed for everyday activities through controlled exercises.  Complete these exercises as instructed by your physician, physical therapist or athletic trainer. Increase the resistance and repetitions only as guided.  You may experience muscle soreness or fatigue, but the pain or discomfort you are trying to eliminate should never worsen during these exercises. If this pain does get worse, stop and make sure you are following the directions exactly. If the pain is still present after adjustments, discontinue the exercise until you can discuss the trouble with your caregiver. STRENGTH - Wrist Flexors  Sit with your right / left forearm palm-up and fully supported on a table or countertop. Your elbow should be resting below the height of your shoulder. Allow your wrist to extend over the edge of the surface.  Loosely holding a __________ weight, or a piece of rubber exercise band or tubing, slowly curl your hand up toward your forearm.  Hold this position for __________ seconds. Slowly lower the wrist back to the starting position in a controlled manner. Repeat __________ times. Complete this exercise __________ times per day.  STRENGTH - Wrist Extensors  Sit with your right / left forearm palm-down and fully supported on a table or countertop. Your elbow should be resting below the height of your shoulder. Allow your wrist to extend over the edge of the surface.  Loosely holding a __________ weight, or a piece of rubber exercise band or tubing, slowly curl your hand up toward your forearm.  Hold this position for __________ seconds. Slowly lower the wrist back to the starting position in a controlled manner. Repeat __________ times. Complete this exercise __________ times per day.  STRENGTH - Ulnar Deviators  Stand with a ____________________ weight in your right / left hand, or sit while holding a rubber exercise band or tubing, with your healthy arm supported on a table or countertop.  Move  your wrist, so that your pinkie travels toward your forearm and your thumb moves away from your forearm.  Hold this position for __________ seconds and then slowly lower the wrist back to the starting position. Repeat __________ times. Complete this exercise __________ times per day STRENGTH - Radial Deviators  Stand with a ____________________ weight in your right / left hand, or sit while holding a rubber exercise band or tubing, with your injured arm supported on a table or countertop.  Raise your hand upward in front of you or pull up on the rubber tubing.  Hold this position for __________ seconds and then slowly lower the wrist back to the starting position. Repeat __________ times. Complete this exercise __________ times per day. STRENGTH - Forearm Supinators   Sit with your right / left forearm supported on a table, keeping your elbow below shoulder height. Rest your hand over the edge, palm down.  Gently grip a hammer or a soup ladle.  Without moving your elbow, slowly turn your palm and hand upward to a "thumbs-up" position.  Hold this position for __________ seconds. Slowly return to the starting position. Repeat __________ times. Complete this exercise __________ times per day.  STRENGTH - Forearm Pronators   Sit with your right / left forearm supported on a table, keeping your elbow below shoulder height. Rest your hand over the edge, palm up.  Gently grip a hammer or  a soup ladle.  Without moving your elbow, slowly turn your palm and hand upward to a "thumbs-up" position.  Hold this position for __________ seconds. Slowly return to the starting position. Repeat __________ times. Complete this exercise __________ times per day.  STRENGTH - Grip  Grasp a tennis ball, a dense sponge, or a large, rolled sock in your hand.  Squeeze as hard as you can, without increasing any pain.  Hold this position for __________ seconds. Release your grip slowly. Repeat __________  times. Complete this exercise __________ times per day.  STRENGTH - Elbow Extensors, Isometric  Stand or sit upright, on a firm surface. Place your right / left arm so that your palm faces your stomach, and it is at the height of your waist.  Place your opposite hand on the underside of your forearm. Gently push up as your right / left arm resists. Push as hard as you can with both arms, without causing any pain or movement at your right / left elbow. Hold this stationary position for __________ seconds. Gradually release the tension in both arms. Allow your muscles to relax completely before repeating.   This information is not intended to replace advice given to you by your health care provider. Make sure you discuss any questions you have with your health care provider.   Document Released: 06/05/2005 Document Revised: 06/26/2014 Document Reviewed: 09/17/2008 Elsevier Interactive Patient Education [employer].

## 2016-02-06 LAB — CULTURE, GROUP A STREP: ORGANISM ID, BACTERIA: NORMAL

## 2016-03-06 ENCOUNTER — Other Ambulatory Visit: Payer: Self-pay | Admitting: Family Medicine

## 2016-03-06 DIAGNOSIS — M545 Low back pain, unspecified: Secondary | ICD-10-CM

## 2016-03-06 DIAGNOSIS — M542 Cervicalgia: Secondary | ICD-10-CM

## 2016-03-06 DIAGNOSIS — Z114 Encounter for screening for human immunodeficiency virus [HIV]: Secondary | ICD-10-CM

## 2016-03-29 ENCOUNTER — Telehealth: Payer: Self-pay

## 2016-03-29 ENCOUNTER — Other Ambulatory Visit: Payer: Self-pay | Admitting: Obstetrics and Gynecology

## 2016-03-29 DIAGNOSIS — S5001XA Contusion of right elbow, initial encounter: Secondary | ICD-10-CM

## 2016-03-29 DIAGNOSIS — Z1231 Encounter for screening mammogram for malignant neoplasm of breast: Secondary | ICD-10-CM

## 2016-03-29 DIAGNOSIS — M7711 Lateral epicondylitis, right elbow: Secondary | ICD-10-CM

## 2016-03-29 NOTE — Telephone Encounter (Signed)
PATIENT CALLED BACK AGAIN TO ADD SOMETHING ELSE TO THIS MESSAGE. SHE SAID DR. Tamala Julian WANTS HER TO HAVE A CAT SCAN ON THE (L) SIDE OF HER NECK. SINCE SHE IS HURTING SO BAD SHE WOULD LIKE DR. Tamala Julian TO ORDER HER TO HAVE A CAT SCAN ALL OVER HER BODY.  [place]

## 2016-03-29 NOTE — Telephone Encounter (Signed)
Spoke to patient on the phone and advised her that Dr. Tamala Julian was not in the office today, and would she like me to forward the messages to another provider.  She said, "No" she only wanted Dr. Thompson Caul opinion and it was not an urgent but ongoing problem.  She was very appreciative of the promptness of the returned call and I assured we would call her back as soon as Dr. Tamala Julian was able to review her chart.

## 2016-03-29 NOTE — Telephone Encounter (Signed)
PATIENT STATES SHE SAW DR. Tamala Julian IN AUGUST FOR A BROKEN (R) ELBOW AND A SORE THROAT. HER CELL  PHONE HAS NOT BEEN WORKING AND SHE KNOWS WE HAVE TRIED TO CONTACT HER. SHE WOULD LIKE TO GET HER THROAT CULTURE RESULTS AND FIND OUT WHAT THE UROLOGIST SAID THAT DR. Tamala Julian SENT HER TO. [place] DR. Tamala Julian TO REFER HER TO A RHEUMATOLOGIST BECAUSE HER ELBOW AND ARM STILL HURTS. BEST PHONE [phone] (THIS IS HER CELL-SHE SAID IT HAS BEEN WORKING THE LAST FEW DAYS) OTHERWISE, CALL HER HOME PHONE AFTER 5:00 AT [phone].  [place]

## 2016-03-31 NOTE — Telephone Encounter (Signed)
Call --- 1. Throat culture was negative from visit in August; no evidence of strep throat. Is her throat still hurting?  2. Please clarify, I referred her to urology in 11/2015. When did she last see urology?  3.   I am happy to refer her to orthopedics for ongoing R elbow pain.  Since she suffered an injury, I think an orthopedist would be the best to see her for the R elbow pain.  I have placed referral to ortho.  4.  I cannot order a whole body CT scan; insurance will not cover a full body CT and that is way too much radiation.

## 2016-04-03 NOTE — Telephone Encounter (Signed)
LMTRC

## 2016-04-04 NOTE — Telephone Encounter (Signed)
lmom to cb. 

## 2016-04-05 NOTE — Telephone Encounter (Signed)
Pt still has not CB. Sent unable to reach letter with Dr Thompson Caul copied message pasted into letter.

## 2016-04-06 ENCOUNTER — Telehealth: Payer: Self-pay

## 2016-04-06 NOTE — Telephone Encounter (Addendum)
Pt CB and reported that 1. Her throat still hurts in the same place and is a constant soreness. 2. Pt did see the urologist and ran some tests. They were supposed to have sent the results to her, that was several months ago. 3. Pt reported that her L arm now hurts too, and it hurts all the way up in to her neck and down to her fingers. She said it feels more muscular and is just a soreness now in back/neck/and both arms. Pt also reports fatigues. She states she just doesn't have the energy to do anything in the evenings or weekends even thought she really wants to. Pt stated she is "not depressed. She is happy, but miserable". Pt wonders if it wouldn't be better to see a rheumatologist than ortho? Pt also wondered, with her hx of blood clots if it is possible that her R arm injury could have thrown some clots and they are causing her the pain? 4. I informed pt of your info on whole body scan.  5. Pt wanted me to ask Dr Tamala Julian if she could Rx her the [place] that she uses for anal fissures when she gets them. She started getting one this morning and had previously gotten Rx from her surgeon, but would rather have Dr Tamala Julian Rx if she can.

## 2016-04-06 NOTE — Telephone Encounter (Signed)
Pt missed her CB. Please reference call from 03/29/16. CB [phone]

## 2016-04-07 NOTE — Telephone Encounter (Signed)
Call --- if patient's throat is still sore, she needs to return to office for reevaluation.  2.  If she is now having LEFT arm pain, I have not evaluated her for this.  I have also not evaluated her for the fatigue.  I would first start with ortho referral because she has sustained an injury to the RIGHT elbow.  3.  Her exam in August was not suggestive of a blood clot in the R arm.  4.  I have never seen her for the anal fissures; thus, I recommend an office visit for evaluation.   With all of these new concerns and worsening/persisent symptoms, pt needs office visit; I cannot manage all of these issues via phone.

## 2016-04-07 NOTE — Telephone Encounter (Signed)
This is duplicate from [id number] which has been sent to Dr Tamala Julian after speaking to pt yesterday.

## 2016-04-08 ENCOUNTER — Other Ambulatory Visit: Payer: Self-pay | Admitting: Family Medicine

## 2016-04-08 DIAGNOSIS — Z114 Encounter for screening for human immunodeficiency virus [HIV]: Secondary | ICD-10-CM

## 2016-04-08 DIAGNOSIS — M545 Low back pain, unspecified: Secondary | ICD-10-CM

## 2016-04-08 DIAGNOSIS — M542 Cervicalgia: Secondary | ICD-10-CM

## 2016-04-12 NOTE — Telephone Encounter (Signed)
Tried to call pt. Voice mail left to call us.

## 2016-04-13 NOTE — Telephone Encounter (Signed)
Attempted to call pt. No answer. Message left to return call.

## 2016-04-14 NOTE — Telephone Encounter (Signed)
Now that it is two months past her visit, I must reevaluate her sore throat, gland pain before proceeding with CT.

## 2016-04-14 NOTE — Telephone Encounter (Signed)
Spoke with pt. Throat is sore on outside. Difficulty swallowing. Advised her of need for Ov. She stated that you had told her you were going to schedule a CT scan of her throat and a referral to a rheumatologist . And that is all she wants.

## 2016-04-17 ENCOUNTER — Ambulatory Visit
Admission: RE | Admit: 2016-04-17 | Discharge: 2016-04-17 | Disposition: A | Discharge status: Home / Self Care | Payer: PRIVATE HEALTH INSURANCE | Source: Ambulatory Visit | Attending: Obstetrics and Gynecology | Admitting: Obstetrics and Gynecology

## 2016-04-17 DIAGNOSIS — Z1231 Encounter for screening mammogram for malignant neoplasm of breast: Secondary | ICD-10-CM

## 2016-04-20 ENCOUNTER — Other Ambulatory Visit: Payer: Self-pay | Admitting: Obstetrics and Gynecology

## 2016-04-20 ENCOUNTER — Other Ambulatory Visit: Payer: Self-pay

## 2016-04-20 DIAGNOSIS — R928 Other abnormal and inconclusive findings on diagnostic imaging of breast: Secondary | ICD-10-CM

## 2016-04-21 ENCOUNTER — Other Ambulatory Visit: Payer: Self-pay | Admitting: Family Medicine

## 2016-04-21 DIAGNOSIS — F411 Generalized anxiety disorder: Secondary | ICD-10-CM

## 2016-04-21 NOTE — Telephone Encounter (Signed)
Last seen for anxiety on 8/18. Last Rx written 10/21/15 for 6 mos of RFs.

## 2016-04-24 NOTE — Telephone Encounter (Signed)
The request for 60 tablets per month of Klonopin denied.  Only refilled 45 tablets per month as rx is written. Please call Klonopin into pharmacy.

## 2016-04-25 ENCOUNTER — Encounter (INDEPENDENT_AMBULATORY_CARE_PROVIDER_SITE_OTHER): Payer: Self-pay | Admitting: Orthopaedic Surgery

## 2016-04-25 ENCOUNTER — Ambulatory Visit (INDEPENDENT_AMBULATORY_CARE_PROVIDER_SITE_OTHER): Payer: PRIVATE HEALTH INSURANCE

## 2016-04-25 ENCOUNTER — Ambulatory Visit (INDEPENDENT_AMBULATORY_CARE_PROVIDER_SITE_OTHER): Payer: PRIVATE HEALTH INSURANCE | Admitting: Orthopaedic Surgery

## 2016-04-25 ENCOUNTER — Telehealth: Payer: Self-pay

## 2016-04-25 ENCOUNTER — Telehealth (INDEPENDENT_AMBULATORY_CARE_PROVIDER_SITE_OTHER): Payer: Self-pay | Admitting: *Deleted

## 2016-04-25 DIAGNOSIS — M25551 Pain in right hip: Secondary | ICD-10-CM | POA: Diagnosis not present

## 2016-04-25 DIAGNOSIS — M542 Cervicalgia: Secondary | ICD-10-CM | POA: Diagnosis not present

## 2016-04-25 DIAGNOSIS — M25521 Pain in right elbow: Secondary | ICD-10-CM | POA: Insufficient documentation

## 2016-04-25 MED ORDER — PREDNISONE 10 MG (21) PO TBPK: 10.0000 mg | ORAL_TABLET | Freq: Every day | ORAL | 0 refills | Status: DC

## 2016-04-25 NOTE — Telephone Encounter (Signed)
Pharmacy called about prednisone prescription about the dosage. Call back number is [phone] Sharon [name]

## 2016-04-25 NOTE — Progress Notes (Signed)
Office Visit Note   Patient: Sharon [name]           Date of Birth: 02/18/1966           MRN: [id number] Visit Date: 04/25/2016              Requested by: Wardell Honour, MD [address] [place], [place] [address] PCP: Reginia Forts, MD   Assessment & Plan: Visit Diagnoses:  1. Neck pain   2. Pain in right hip   3. Pain in right elbow     Plan:  - offered steroid injection for right elbow and hip, patient declines for now - prednisone taper prescribed today - f/u if not better after taper, consider injections  Follow-Up Instructions: Return if symptoms worsen or fail to improve.   Orders:  Orders Placed This Encounter  Procedures  . XR HIP UNILAT W OR W/O PELVIS 1V RIGHT  . XR Cervical Spine 2 or 3 views   Meds ordered this encounter  Medications  . predniSONE (STERAPRED UNI-PAK 21 TAB) 10 MG (21) TBPK tablet    Sig: Take 1 tablet (10 mg total) by mouth daily.    Dispense:  21 tablet    Refill:  0      Procedures: No procedures performed   Clinical Data: No additional findings.   Subjective: Chief Complaint  Patient presents with  . Right Elbow - Pain  . Right Hip - Pain  . Neck - Pain    50 yo female with multiple complaints of right elbow pain, left neck radicular pain, right hip pain.  Right elbow hurts over lateral epicondyle and extensor mass with radiation down into fingers, denies numbness or focal weakness.  Aches and it's worse at night.  May have bumped it into a fence.  Left neck pain radiates into fingers without focal symptoms.  Endorses numbness and tingling and aching.  Denies f/c/n/v/i.  Denies photophobia.  Right hip pain for years with groin pain.  Hurts deep in the hip region with recent radiation into groin.  She experiences body aches.  She has taken po nsaids.      Review of Systems  Constitutional: Negative.   HENT: Negative.   Eyes: Negative.   Respiratory: Negative.   Cardiovascular: Negative.   Endocrine: Negative.     Musculoskeletal: Negative.   Neurological: Negative.   Hematological: Negative.   Psychiatric/Behavioral: Negative.   All other systems reviewed and are negative.    Objective: Vital Signs: LMP 10/11/2010   Physical Exam  Constitutional: She is oriented to person, place, and time. She appears well-developed and well-nourished.  HENT:  Head: Atraumatic.  Eyes: EOM are normal.  Neck: Neck supple.  Cardiovascular: Intact distal pulses.   Pulmonary/Chest: Effort normal.  Abdominal: Soft.  Neurological: She is alert and oriented to person, place, and time.  Skin: Skin is warm. Capillary refill takes less than 2 seconds.  Psychiatric: She has a normal mood and affect. Her behavior is normal. Judgment and thought content normal.  Nursing note and vitals reviewed.   Right Hip Exam   Tenderness  The patient is experiencing no tenderness.     Range of Motion  The patient has normal right hip ROM.  Muscle Strength  The patient has normal right hip strength.  Tests  FABER: positive  Other  Sensation: normal Pulse: present  Comments:  Pain with ER and IR of hip.  Lateral hip nontender.  Negative stinchfield.     Left Shoulder  Exam  Left shoulder exam is normal.  Tenderness  The patient is experiencing no tenderness.     Range of Motion  The patient has normal left shoulder ROM.  Muscle Strength  The patient has normal left shoulder strength.      Specialty Comments:  No specialty comments available.  Imaging: Xr Hip Unilat W Or W/o Pelvis 1v Right  Result Date: 04/25/2016 Mild arthritis of bilateral hips.    Xr Cervical Spine 2 Or 3 Views  Result Date: 04/25/2016 Negative for acute findings.  Borderline congenital stenosis.  Normal lordosis.    PMFS History: Patient Active Problem List   Diagnosis Date Noted  . Pain in right elbow 04/25/2016  . Pain in right hip 04/25/2016  . Neck pain 04/25/2016  . Hypersomnia 05/21/2015  . GAD  (generalized anxiety disorder) 05/31/2014  . Chest pain 05/09/2011  . Anal fissure 05/05/2011  . Cough 10/25/2010  . Dyspnea 10/13/2010  . Hypertension 10/13/2010   Past Medical History:  Diagnosis Date  . Allergy   . Anal fissure   . Anxiety   . Arthritis   . Blood clot in vein    left leg  . Detached retina   . DVT (deep venous thrombosis) (HCC)    left leg  . Endometriosis   . GERD (gastroesophageal reflux disease)   . Heart murmur   . History of hemorrhoids   . Hypertension   . Pulmonary embolism (HCC)     Family History  Problem Relation Age of Onset  . Emphysema Mother     smoker  . Allergies Mother   . Asthma Mother   . Heart disease Mother     AMI as cause of death  . COPD Mother   . Asthma Father   . Heart disease Father     AMI as cause of death  . Diabetes Father   . Asthma Brother   . Heart disease Brother 59    heart failure  . Lung cancer Maternal Grandfather     was a smoker  . Cancer Maternal Grandfather     lung  . Heart disease Paternal Grandmother   . Heart disease Paternal Grandfather   . Colon cancer Neg Hx     Past Surgical History:  Procedure Laterality Date  . ABDOMINAL HYSTERECTOMY    . CATARACT EXTRACTION Bilateral 2015  . ENDOMETRIAL ABLATION    . [place]  2011  . INCONTINENCE SURGERY    . INNER EAR SURGERY    . RECTAL PROLAPSE REPAIR    . RETINAL DETACHMENT SURGERY Bilateral 2000  . TUBAL LIGATION    . VEIN SURGERY Left 04/2010   Social History   Occupational History  . Assistant Manager [employer] History Main Topics  . Smoking status: Never Smoker  . Smokeless tobacco: Never Used  . Alcohol use 1.2 oz/week    2 Glasses of wine per week  . Drug use: No     Comment: former maryjuana use- quit in 1995  . Sexual activity: Yes

## 2016-04-25 NOTE — Telephone Encounter (Signed)
Pt would like Dr. Tamala Julian to know-she is going to her ortho appointment today. She is trying to pass 3 kidney stones that were found in her right kidney. Her gynecologist has informed her she is in menopause. She is having an ultrasound done on her breasts this coming Thursday 04/27/16 because something was found during her mammogram.  She would like to know if her neck referral is still in the works. CB # [phone]

## 2016-04-25 NOTE — Telephone Encounter (Signed)
Called to pharmacy 

## 2016-04-25 NOTE — Telephone Encounter (Signed)
Called Vicente Males back Rx needs to be taken as directed not 1 daily.

## 2016-04-26 NOTE — Telephone Encounter (Signed)
Patient was seen yesterday for neck at ortho.  I am just sending you the original message per patient request.  Thank you!

## 2016-04-27 ENCOUNTER — Ambulatory Visit
Admission: RE | Admit: 2016-04-27 | Discharge: 2016-04-27 | Disposition: A | Discharge status: Home / Self Care | Payer: PRIVATE HEALTH INSURANCE | Source: Ambulatory Visit | Attending: Obstetrics and Gynecology | Admitting: Obstetrics and Gynecology

## 2016-04-27 DIAGNOSIS — R928 Other abnormal and inconclusive findings on diagnostic imaging of breast: Secondary | ICD-10-CM

## 2016-05-13 ENCOUNTER — Other Ambulatory Visit: Payer: Self-pay | Admitting: Family Medicine

## 2016-05-13 DIAGNOSIS — M542 Cervicalgia: Secondary | ICD-10-CM

## 2016-05-13 DIAGNOSIS — M545 Low back pain, unspecified: Secondary | ICD-10-CM

## 2016-05-13 DIAGNOSIS — Z114 Encounter for screening for human immunodeficiency virus [HIV]: Secondary | ICD-10-CM

## 2016-05-16 NOTE — Telephone Encounter (Signed)
01/2016 last ov Has seen ortho for pain since

## 2016-05-19 ENCOUNTER — Telehealth (INDEPENDENT_AMBULATORY_CARE_PROVIDER_SITE_OTHER): Payer: Self-pay | Admitting: Orthopaedic Surgery

## 2016-05-19 NOTE — Telephone Encounter (Signed)
No answer left message on machine.

## 2016-05-19 NOTE — Telephone Encounter (Signed)
Yes she can take it.

## 2016-05-19 NOTE — Telephone Encounter (Signed)
Please advise 

## 2016-05-19 NOTE — Telephone Encounter (Signed)
Patient calling regarding the Rx Prednisone that was prescribed about 3 weeks ago for R hip and R elbow.  She has gotten filled but has not taken because she found out she has  kidney stones...and that was a couple of days after her visit with you.  Please let her know if this is or is not ok to take or if there is something else.  She states the elbow is better, but the hip has gotten worse.  She is working now and the reception in her building is poor, so you may need to leave a voice mail message.

## 2016-06-16 ENCOUNTER — Other Ambulatory Visit: Payer: Self-pay | Admitting: Family Medicine

## 2016-06-16 DIAGNOSIS — M542 Cervicalgia: Secondary | ICD-10-CM

## 2016-06-16 DIAGNOSIS — Z114 Encounter for screening for human immunodeficiency virus [HIV]: Secondary | ICD-10-CM

## 2016-06-16 DIAGNOSIS — M545 Low back pain, unspecified: Secondary | ICD-10-CM

## 2016-06-18 NOTE — Telephone Encounter (Signed)
SS refill req Celebrex.  Last ov given only 30 with no refills

## 2016-06-30 ENCOUNTER — Telehealth: Payer: Self-pay | Admitting: Pulmonary Disease

## 2016-06-30 NOTE — Telephone Encounter (Signed)
lmtcb x1 for Santiago Glad at [facility]. Next available sleep consult is on 08/10/16 with RA.

## 2016-06-30 NOTE — Telephone Encounter (Signed)
VS are you okay with pt switching MD's? Thanks!

## 2016-06-30 NOTE — Telephone Encounter (Signed)
Ok with me 

## 2016-07-03 NOTE — Telephone Encounter (Signed)
Santiago Glad with [facility] returned call, scheduled appt with RA for 08/10/16.  May close encounter.

## 2016-07-10 ENCOUNTER — Telehealth (INDEPENDENT_AMBULATORY_CARE_PROVIDER_SITE_OTHER): Payer: Self-pay | Admitting: Orthopaedic Surgery

## 2016-07-10 NOTE — Telephone Encounter (Signed)
Can you make her an appt next available, no rush

## 2016-07-10 NOTE — Telephone Encounter (Signed)
Pt requested 2nd opinion with Dr. Ninfa Linden. She seen Dr. Erlinda Hong and she thinks she needs another opinion because he did not do a scan on her for rt elbow, rt hip, lower back. She stated she is still in pain.  [phone] can leave vm

## 2016-07-10 NOTE — Telephone Encounter (Signed)
I guess I can see her for an evaluation and an exam.  It does not necessarily mean that "scans" will be ordered depending on her exam and xrays.

## 2016-07-23 ENCOUNTER — Other Ambulatory Visit: Payer: Self-pay | Admitting: Family Medicine

## 2016-07-23 DIAGNOSIS — M545 Low back pain, unspecified: Secondary | ICD-10-CM

## 2016-07-23 DIAGNOSIS — M542 Cervicalgia: Secondary | ICD-10-CM

## 2016-07-23 DIAGNOSIS — Z114 Encounter for screening for human immunodeficiency virus [HIV]: Secondary | ICD-10-CM

## 2016-07-23 NOTE — Telephone Encounter (Signed)
06/20/16 last refill

## 2016-07-27 ENCOUNTER — Ambulatory Visit (INDEPENDENT_AMBULATORY_CARE_PROVIDER_SITE_OTHER): Payer: PRIVATE HEALTH INSURANCE | Admitting: Gastroenterology

## 2016-07-27 ENCOUNTER — Encounter: Payer: Self-pay | Admitting: Gastroenterology

## 2016-07-27 ENCOUNTER — Other Ambulatory Visit: Payer: Self-pay | Admitting: Family Medicine

## 2016-07-27 VITALS — BP 122/78 | HR 72 | Ht 68.0 in | Wt 160.0 lb

## 2016-07-27 DIAGNOSIS — K5901 Slow transit constipation: Secondary | ICD-10-CM

## 2016-07-27 DIAGNOSIS — K581 Irritable bowel syndrome with constipation: Secondary | ICD-10-CM

## 2016-07-27 DIAGNOSIS — I10 Essential (primary) hypertension: Secondary | ICD-10-CM

## 2016-07-27 DIAGNOSIS — R1033 Periumbilical pain: Secondary | ICD-10-CM

## 2016-07-27 NOTE — Patient Instructions (Addendum)
miralax , one half to 1 capful daily for constipation.  Doses can be adjusted up or down as needed.  If no improvement after 2 weeks, try Linzess samples, one capsule daily.  If you are age 52 or older, your body mass index should be between 23-30. Your Body mass index is 24.33 kg/m. If this is out of the aforementioned range listed, please consider follow up with your Primary Care Provider.  If you are age 2 or younger, your body mass index should be between 19-25. Your Body mass index is 24.33 kg/m. If this is out of the aformentioned range listed, please consider follow up with your Primary Care Provider.   Thank you for choosing [facility]  Dr Wilfrid Lund III

## 2016-07-27 NOTE — Progress Notes (Signed)
Sharon [name]  Chief Complaint: Constipation  Subjective  History:  This is a 51 year old woman last seen by me for a screening colonoscopy in July 2017. At that time, she described years of constipation, so I prescribed Linzess. It does not seem that she knew about your got it, but at any rate did not take it. She has continued constipation with a BM about every 2 days. She cannot recall other think she has tried in the past. She will feels that there is also an abdominal "knot" that we'll come up just above in the left of her umbilicus and had has been occurring for several years. This might occur after eating. It is difficult for her to have BMs and she often feels the need to "push from one side or the other to make it happen". She seems to describe what may have been repair of a rectocele with a "mesh placement" at the time of a hysterectomy. 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 She says she is missed a few days of work already due to this, and is concerned about losing her job. At time she will have urgency for loose BM but most often it is constipation. ROS: Cardiovascular:  no chest pain Respiratory: no dyspnea  The patient's Past Medical, Family and Social History were reviewed and are on file in the EMR.  Objective:  Med list reviewed  Vital signs in last 24 hrs: Vitals:   07/27/16 0942  BP: 122/78  Pulse: 72    Physical Exam  Pleasant, anxious appearing  HEENT: sclera anicteric, oral mucosa moist without lesions  Neck: supple, no thyromegaly, JVD or lymphadenopathy  Cardiac: RRR without murmurs, S1S2 heard, no peripheral edema  Pulm: clear to auscultation bilaterally, normal RR and effort noted  Abdomen: soft, no tenderness, with active bowel sounds. No guarding or palpable  hepatosplenomegaly.  Skin; warm and dry, no jaundice or rash  Recent Labs:  Colonoscopy revealed a diminutive cecal hyperplastic polyp and left-sided diverticulosis.  @ASSESSMENTPLANBEGIN @ Assessment: Encounter Diagnoses  Name Primary?  . Slow transit constipation Yes  . Periumbilical pain   . Irritable bowel syndrome with constipation   I believe she has IBS accounting for her constipation and periumbilical abdominal pain as well as possibly pelvic floor dysfunction from prior surgery and possibly rectocele repair.  Plan: MiraLAX, start with a half a capful a day and adjust dose as needed If no improvement after a week or 2, start samples of Linzess 145 g once daily  If no improvement, schedule anorectal motility to see if biofeedback as needed.   Total time 30 minutes, over half spent in counseling and coordination of care.   Sharon [name]

## 2016-08-07 ENCOUNTER — Ambulatory Visit (INDEPENDENT_AMBULATORY_CARE_PROVIDER_SITE_OTHER): Payer: PRIVATE HEALTH INSURANCE

## 2016-08-07 ENCOUNTER — Other Ambulatory Visit (INDEPENDENT_AMBULATORY_CARE_PROVIDER_SITE_OTHER): Payer: Self-pay

## 2016-08-07 ENCOUNTER — Ambulatory Visit (INDEPENDENT_AMBULATORY_CARE_PROVIDER_SITE_OTHER): Payer: PRIVATE HEALTH INSURANCE | Admitting: Orthopaedic Surgery

## 2016-08-07 DIAGNOSIS — G8929 Other chronic pain: Secondary | ICD-10-CM

## 2016-08-07 DIAGNOSIS — M5441 Lumbago with sciatica, right side: Secondary | ICD-10-CM | POA: Diagnosis not present

## 2016-08-07 DIAGNOSIS — M25551 Pain in right hip: Secondary | ICD-10-CM

## 2016-08-07 NOTE — Progress Notes (Signed)
Office Visit Note   Patient: Sharon [name]           Date of Birth: [birth year]           MRN: [id number] Visit Date: 08/07/2016              Requested by: Wardell Honour, MD [address] [place], [place] [address] PCP: Reginia Forts, MD   Assessment & Plan: Visit Diagnoses:  1. Chronic right-sided low back pain with right-sided sciatica   2. Pain of right hip joint     Plan: Given the severity of her pain and the fact that her right hip pain is now worsening in the groin and MRI is warranted to assess the cartilage around her right hip. This is been now going on for 5-6 months and is not improving in spite of rest, ice, heat, activity modification and anti-inflammatories. The fact that this is not getting better is concerning to her. This seems to be more hip related that is back related this and MRI is warranted at this point. She is deferring any injections until we have an MRI.  Follow-Up Instructions: Return in about 2 weeks (around 08/21/2016).   Orders:  Orders Placed This Encounter  Procedures  . XR Lumbar Spine 2-3 Views   No orders of the defined types were placed in this encounter.     Procedures: No procedures performed   Clinical Data: No additional findings.   Subjective: No chief complaint on file. Patient comes in with chief complaint of right hip pain. She saw my partners for this back in November. Her pain is worsened since then. His right hip. It is 10 out of 10. She says it hurts mainly with mobility and rotation her hip. She says his detrimentally affected her activity is daily living, her quality of life, and her mobility. She has tried activity modification as well as rest, ice, heat, and anti-inflammatories. She is very frustrated and feels that there is definitely pathology in her back or her hip is causing this type of pain  HPI  Review of Systems She denies any change in bowel or bladder function. She denies any fever  chills.  Objective: Vital Signs: LMP 10/11/2010   Physical Exam She is alert and oriented 3 in no acute distress Ortho Exam Examination of her right hip shows pain on the lateral aspect with the extremes of internal and external rotation of right hip. There is no blocks rotation. She does hurt in her groin as well. Her back exam shows excellent flexion-extension. She has normal motor and sensory exam in both of her lower extremities. Her straight leg raise is negative. Specialty Comments:  No specialty comments available.  Imaging: Xr Lumbar Spine 2-3 Views  Result Date: 08/07/2016 An AP and lateral of the lumbar spine shows normal alignment with well-maintained disc spaces in no acute findings. You can see the top of both hips and they appear normal well located with no significant arthritic changes. Her SI joints bilaterally appear normal    PMFS History: Patient Active Problem List   Diagnosis Date Noted  . Pain in right elbow 04/25/2016  . Pain in right hip 04/25/2016  . Neck pain 04/25/2016  . Hypersomnia 05/21/2015  . GAD (generalized anxiety disorder) 05/31/2014  . Chest pain 05/09/2011  . Anal fissure 05/05/2011  . Cough 10/25/2010  . Dyspnea 10/13/2010  . Hypertension 10/13/2010   Past Medical History:  Diagnosis Date  . Allergy   . Anal  fissure   . Anxiety   . Arthritis   . Blood clot in vein    left leg  . Detached retina   . DVT (deep venous thrombosis) (HCC)    left leg  . Endometriosis   . GERD (gastroesophageal reflux disease)   . Heart murmur   . History of hemorrhoids   . Hypertension   . Pulmonary embolism (HCC)     Family History  Problem Relation Age of Onset  . Emphysema Mother     smoker  . Allergies Mother   . Asthma Mother   . Heart disease Mother     AMI as cause of death  . COPD Mother   . Asthma Father   . Heart disease Father     AMI as cause of death  . Diabetes Father   . Asthma Brother   . Heart disease Brother 3     heart failure  . Lung cancer Maternal Grandfather     was a smoker  . Cancer Maternal Grandfather     lung  . Heart disease Paternal Grandmother   . Heart disease Paternal Grandfather   . Colon cancer Neg Hx     Past Surgical History:  Procedure Laterality Date  . ABDOMINAL HYSTERECTOMY    . CATARACT EXTRACTION Bilateral 2015  . ENDOMETRIAL ABLATION    . [place]  2011  . INCONTINENCE SURGERY    . INNER EAR SURGERY    . RECTAL PROLAPSE REPAIR    . RETINAL DETACHMENT SURGERY Bilateral 2000  . TUBAL LIGATION    . VEIN SURGERY Left 04/2010   Social History   Occupational History  . 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 Smoking status: Never Smoker  . Smokeless tobacco: Never Used  . Alcohol use 1.2 oz/week    2 Glasses of wine per week  . Drug use: No     Comment: former maryjuana use- quit in 1995  . Sexual activity: Yes

## 2016-08-10 ENCOUNTER — Institutional Professional Consult (permissible substitution): Payer: PRIVATE HEALTH INSURANCE | Admitting: Pulmonary Disease

## 2016-08-14 ENCOUNTER — Ambulatory Visit
Admission: RE | Admit: 2016-08-14 | Discharge: 2016-08-14 | Disposition: A | Discharge status: Home / Self Care | Payer: PRIVATE HEALTH INSURANCE | Source: Ambulatory Visit | Attending: Orthopaedic Surgery | Admitting: Orthopaedic Surgery

## 2016-08-14 DIAGNOSIS — M25551 Pain in right hip: Secondary | ICD-10-CM

## 2016-08-21 ENCOUNTER — Ambulatory Visit (INDEPENDENT_AMBULATORY_CARE_PROVIDER_SITE_OTHER): Payer: PRIVATE HEALTH INSURANCE | Admitting: Orthopaedic Surgery

## 2016-08-21 DIAGNOSIS — M7061 Trochanteric bursitis, right hip: Secondary | ICD-10-CM

## 2016-08-21 DIAGNOSIS — M25551 Pain in right hip: Secondary | ICD-10-CM

## 2016-08-21 MED ORDER — DICLOFENAC SODIUM 1 % TD GEL: 2.0000 g | Freq: Four times a day (QID) | TRANSDERMAL | 3 refills | Status: DC

## 2016-08-21 MED ORDER — METHYLPREDNISOLONE ACETATE 40 MG/ML IJ SUSP
40.0000 mg | INTRAMUSCULAR | Status: AC | PRN
  Administered 2016-08-21: 40 mg via INTRA_ARTICULAR

## 2016-08-21 NOTE — Progress Notes (Signed)
Office Visit Note   Patient: Sharon [name]           Date of Birth: 12/22/1965           MRN: [id number] Visit Date: 08/21/2016              Requested by: Wardell Honour, MD [address] [place], [place] [address] PCP: Reginia Forts, MD   Assessment & Plan: Visit Diagnoses:  1. Pain of right hip joint   2. Trochanteric bursitis, right hip     Plan: She tolerated the trochanteric injection quite well on her right side. This did help significantly with her pain. On the have her still try stretching exercises and will see if her insurance will cover Voltaren gel. We'll see her back in a month to see if this is helped.  Follow-Up Instructions: Return in about 4 weeks (around 09/18/2016).   Orders:  Orders Placed This Encounter  Procedures  . Large Joint Injection/Arthrocentesis   Meds ordered this encounter  Medications  . diclofenac sodium (VOLTAREN) 1 % GEL    Sig: Apply 2 g topically 4 (four) times daily.    Dispense:  100 g    Refill:  3      Procedures: Large Joint Inj Date/Time: 08/21/2016 4:44 PM Performed by: Mcarthur Rossetti Authorized by: Mcarthur Rossetti   Location:  Hip Site:  R greater trochanter Ultrasound Guidance: No   Fluoroscopic Guidance: No   Arthrogram: No   Medications:  40 mg methylPREDNISolone acetate 40 MG/ML     Clinical Data: No additional findings.   Subjective: No chief complaint on file. The patient is here in follow-up to go over MRI of her right hip. She says some chronic pain with her hip mainly with weightbearing types of activities and rotation of the hip. She's never had an injection in that hip. She points the trochanteric area and the groin as source for pain. We sent her for an MRI to see if this would shed some light on to what she is feeling. She hurts every day and this is detrimental effecting her activities daily living, her quality of life, and her mobility.  HPI  Review of Systems She currently denies  any chest pain, shortness of breath, fever, chills, nausea, vomiting.  Objective: Vital Signs: LMP 10/11/2010   Physical Exam He is alert and oriented 3 and in no acute distress Ortho Exam Examination of her right hip today she is mainly pain over trochanteric area to palpation as well as range of motion slightly in the groin.  Specialty Comments:  No specialty comments available.  Imaging: Mr Hip Right W/o Contrast  Result Date: 08/21/2016 CLINICAL DATA:  Right hip pain, groin pain for 6 months EXAM: MR OF THE RIGHT HIP WITHOUT CONTRAST TECHNIQUE: Multiplanar, multisequence MR imaging was performed. No intravenous contrast was administered. COMPARISON:  None. FINDINGS: Bones: No hip fracture, dislocation or avascular necrosis. No periosteal reaction or bone destruction. No aggressive osseous lesion. Normal sacrum and sacroiliac joints. No SI joint widening or erosive changes. Articular cartilage and labrum Articular cartilage:  No chondral defect. Labrum: Limited evaluation of the labrum secondary lack of intra-articular contrast. Possible tear of the superior anterior right labrum. Joint or bursal effusion Joint effusion:  No hip joint effusion.  No SI joint effusion. Bursae:  No bursa formation. Muscles and tendons Flexors: Normal. Extensors: Normal. Abductors: Normal. Adductors: Normal. Gluteals: Normal. Hamstrings: Normal. Other findings Miscellaneous: No pelvic free fluid. No fluid collection  or hematoma. No inguinal lymphadenopathy. No inguinal hernia. IMPRESSION: 1. No hip fracture, dislocation or avascular necrosis. 2. Limited evaluation of the labrum secondary lack of intra-articular contrast. Possible tear of the superior anterior right labrum. Electronically Signed   By: Kathreen Devoid   On: 08/21/2016 16:25     PMFS History: Patient Active Problem List   Diagnosis Date Noted  . Pain in right elbow 04/25/2016  . Pain in right hip 04/25/2016  . Neck pain 04/25/2016  . Hypersomnia  05/21/2015  . GAD (generalized anxiety disorder) 05/31/2014  . Chest pain 05/09/2011  . Anal fissure 05/05/2011  . Cough 10/25/2010  . Dyspnea 10/13/2010  . Hypertension 10/13/2010   Past Medical History:  Diagnosis Date  . Allergy   . Anal fissure   . Anxiety   . Arthritis   . Blood clot in vein    left leg  . Detached retina   . DVT (deep venous thrombosis) (HCC)    left leg  . Endometriosis   . GERD (gastroesophageal reflux disease)   . Heart murmur   . History of hemorrhoids   . Hypertension   . Pulmonary embolism (HCC)     Family History  Problem Relation Age of Onset  . Emphysema Mother     smoker  . Allergies Mother   . Asthma Mother   . Heart disease Mother     AMI as cause of death  . COPD Mother   . Asthma Father   . Heart disease Father     AMI as cause of death  . Diabetes Father   . Asthma Brother   . Heart disease Brother 71    heart failure  . Lung cancer Maternal Grandfather     was a smoker  . Cancer Maternal Grandfather     lung  . Heart disease Paternal Grandmother   . Heart disease Paternal Grandfather   . Colon cancer Neg Hx     Past Surgical History:  Procedure Laterality Date  . ABDOMINAL HYSTERECTOMY    . CATARACT EXTRACTION Bilateral 2015  . ENDOMETRIAL ABLATION    . [place]  2011  . INCONTINENCE SURGERY    . INNER EAR SURGERY    . RECTAL PROLAPSE REPAIR    . RETINAL DETACHMENT SURGERY Bilateral 2000  . TUBAL LIGATION    . VEIN SURGERY Left 04/2010   Social History   Occupational History  . 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 Smoking status: Never Smoker  . Smokeless tobacco: Never Used  . Alcohol use 1.2 oz/week    2 Glasses of wine per week  . Drug use: No     Comment: former maryjuana use- quit in 1995  . Sexual activity: Yes

## 2016-09-27 ENCOUNTER — Encounter (INDEPENDENT_AMBULATORY_CARE_PROVIDER_SITE_OTHER): Payer: Self-pay | Admitting: Orthopaedic Surgery

## 2016-09-27 ENCOUNTER — Ambulatory Visit (INDEPENDENT_AMBULATORY_CARE_PROVIDER_SITE_OTHER): Payer: PRIVATE HEALTH INSURANCE | Admitting: Orthopaedic Surgery

## 2016-09-27 DIAGNOSIS — M7061 Trochanteric bursitis, right hip: Secondary | ICD-10-CM | POA: Diagnosis not present

## 2016-09-27 NOTE — Progress Notes (Signed)
Patient is continuing to follow-up with right hip pain and known trochanteric bursitis. We performed a steroid injection of the trochanteric area last month. She's been trying stretching exercises well. She still been getting pain around her hip as well. Injection wasn't too helpful but the exercises of been good for her. She still been reluctant to go to physical therapy due to job constraints.  On examination her right hip has pain over the trochanteric area and IT band but otherwise full range of motion and normal exam. She is having some low back pain over the facet joints of the lowest aspect lumbar spine bilaterally.  At this point physical therapy would be the best thing for to try to give her prescription for this to consider. She will try to get this to work into her schedule. She would like to have another steroid injection around her right hip area but I told her this is too early. With that being said she would like a follow-up in about 3 months for reassessment.

## 2016-10-18 ENCOUNTER — Other Ambulatory Visit: Payer: Self-pay | Admitting: Family Medicine

## 2016-10-18 DIAGNOSIS — K219 Gastro-esophageal reflux disease without esophagitis: Secondary | ICD-10-CM

## 2016-10-23 ENCOUNTER — Other Ambulatory Visit: Payer: Self-pay | Admitting: Physician Assistant

## 2016-10-23 DIAGNOSIS — I10 Essential (primary) hypertension: Secondary | ICD-10-CM

## 2016-10-28 ENCOUNTER — Other Ambulatory Visit: Payer: Self-pay | Admitting: Physician Assistant

## 2016-10-28 DIAGNOSIS — I10 Essential (primary) hypertension: Secondary | ICD-10-CM

## 2016-11-08 ENCOUNTER — Encounter: Payer: Self-pay | Admitting: Family Medicine

## 2016-11-08 ENCOUNTER — Ambulatory Visit (INDEPENDENT_AMBULATORY_CARE_PROVIDER_SITE_OTHER): Payer: PRIVATE HEALTH INSURANCE | Admitting: Family Medicine

## 2016-11-08 VITALS — BP 123/76 | HR 62 | Temp 98.9°F | Resp 16 | Ht 67.5 in | Wt 148.0 lb

## 2016-11-08 DIAGNOSIS — Z23 Encounter for immunization: Secondary | ICD-10-CM

## 2016-11-08 DIAGNOSIS — M542 Cervicalgia: Secondary | ICD-10-CM | POA: Diagnosis not present

## 2016-11-08 DIAGNOSIS — F411 Generalized anxiety disorder: Secondary | ICD-10-CM | POA: Diagnosis not present

## 2016-11-08 DIAGNOSIS — K219 Gastro-esophageal reflux disease without esophagitis: Secondary | ICD-10-CM

## 2016-11-08 DIAGNOSIS — Z7251 High risk heterosexual behavior: Secondary | ICD-10-CM

## 2016-11-08 DIAGNOSIS — Z131 Encounter for screening for diabetes mellitus: Secondary | ICD-10-CM | POA: Diagnosis not present

## 2016-11-08 DIAGNOSIS — G8929 Other chronic pain: Secondary | ICD-10-CM

## 2016-11-08 DIAGNOSIS — Z114 Encounter for screening for human immunodeficiency virus [HIV]: Secondary | ICD-10-CM

## 2016-11-08 DIAGNOSIS — M545 Low back pain: Secondary | ICD-10-CM

## 2016-11-08 DIAGNOSIS — I10 Essential (primary) hypertension: Secondary | ICD-10-CM | POA: Diagnosis not present

## 2016-11-08 MED ORDER — PANTOPRAZOLE SODIUM 40 MG PO TBEC: 40.0000 mg | DELAYED_RELEASE_TABLET | Freq: Every day | ORAL | 3 refills | Status: DC

## 2016-11-08 MED ORDER — ONDANSETRON 8 MG PO TBDP: 8.0000 mg | ORAL_TABLET | Freq: Three times a day (TID) | ORAL | 2 refills | Status: DC | PRN

## 2016-11-08 MED ORDER — EPINEPHRINE 0.3 MG/0.3ML IJ SOAJ: 1.0000 mg | Freq: Once | INTRAMUSCULAR | 1 refills | Status: AC

## 2016-11-08 MED ORDER — PRIMIDONE 50 MG PO TABS: ORAL_TABLET | ORAL | 1 refills | Status: DC

## 2016-11-08 MED ORDER — CLONAZEPAM 0.5 MG PO TABS: ORAL_TABLET | ORAL | 5 refills | Status: DC

## 2016-11-08 MED ORDER — CELECOXIB 200 MG PO CAPS: 200.0000 mg | ORAL_CAPSULE | Freq: Every day | ORAL | 1 refills | Status: DC

## 2016-11-08 MED ORDER — NEBIVOLOL HCL 5 MG PO TABS: 5.0000 mg | ORAL_TABLET | Freq: Every day | ORAL | 1 refills | Status: DC

## 2016-11-08 NOTE — Progress Notes (Signed)
Subjective:    Patient ID: Sharon [name], female    DOB: 03-Jul-1965, 51 y.o.   MRN: [id number]  11/08/2016  Medication Refill (per patient, needs all meds including zofran and epi pens) and Labs Only (per patient, she would like all labs drawn today)   HPI This 51 y.o. female presents for nine month follow-up for general anxiety disorder, GERD, hypertension.  Since last visit, broke up with HIV positive boyfriend.  No sex in over one year.  Has been using protection for two years.  Much less stress.  Work is less tedious. Had three people retired.  Receptionist at [employer].  Anxiety has improved; staying busy; working in the yard.  Going to the beach.  Female friend going to beach with patient; might be a new friend.  Got divorced six years ago; really close.  Having fun; doing things together on weekends.  [place] for one week.    Daughter and her girlfriend will come down.  [place].  Has a really great friend in [place].  Clonazepam 0.22m qhs; feels like crawling under skin; feels like RLS; no leg movement to relief symptoms. Sleeps really well; sleeping much better since break up.  No Klonopin during the day.    Has another sleep study test this week; last sleep study was three years ago.  When lays on LEFT side, chokes and cannot sleep; slobers really badly.  [facility] is scheduling another sleep study.  Dr. TGaetano Netordered repeat sleep study.  Other patient kept pt up all night by communicating with respiratory therapist.  Last ENT consultation years ago.  No nasal congestion during the day; minimal nasal congestion through nose.  Has pain along L neck for years.  Has met deductible.    R elbow is now much better; s/p steroid oral.    Then had R hip bursitis; s/p xray of hip; s/p steroid injection; home exercises provided; now much improvement; also provided rx for therapy yet cannot get off work. Really nice with Dr. BRush Farmer  Takes Celebrex every day regarding ongoing lower  back pain.  Two Tylenol qhs; may take 2 Tylenol during the day.    Genital herpes: taking Acyclovir once daily.   HTN: Patient reports good compliance with medication, good tolerance to medication, and good symptom control.  Bystolic 562mdaily.   Tremors: taking Primidone 2 every morning.  Working really great.  Started by neurologist.Athar.     Review of Systems  Constitutional: Negative for chills, diaphoresis, fatigue and fever.  Eyes: Negative for visual disturbance.  Respiratory: Negative for cough and shortness of breath.   Cardiovascular: Negative for chest pain, palpitations and leg swelling.  Gastrointestinal: Negative for abdominal pain, constipation, diarrhea, nausea and vomiting.  Endocrine: Negative for cold intolerance, heat intolerance, polydipsia, polyphagia and polyuria.  Musculoskeletal: Positive for arthralgias and back pain.  Neurological: Positive for tremors. Negative for dizziness, seizures, syncope, facial asymmetry, speech difficulty, weakness, light-headedness, numbness and headaches.  Psychiatric/Behavioral: Negative for dysphoric mood and sleep disturbance. The patient is not nervous/anxious.     Past Medical History:  Diagnosis Date  . Allergy   . Anal fissure   . Anxiety   . Arthritis   . Blood clot in vein    left leg  . Detached retina   . DVT (deep venous thrombosis) (HCC)    left leg  . Endometriosis   . GERD (gastroesophageal reflux disease)   . Heart murmur   . History of hemorrhoids   .  Hypertension   . Pulmonary embolism [facility])    Past Surgical History:  Procedure Laterality Date  . ABDOMINAL HYSTERECTOMY    . CATARACT EXTRACTION Bilateral 2015  . ENDOMETRIAL ABLATION    . [place]  2011  . INCONTINENCE SURGERY    . INNER EAR SURGERY    . RECTAL PROLAPSE REPAIR    . RETINAL DETACHMENT SURGERY Bilateral 2000  . TUBAL LIGATION    . VEIN SURGERY Left 04/2010   Allergies  Allergen Reactions  . Dextromethorphan Polistirex  Er     Pt states caused "a lump" in her throat, making it difficult to swallow  . Hydrocodone Nausea And Vomiting  . Penicillins     Unknown: as child    Social History   Social History  . Marital status: Divorced    Spouse name: N/A  . Number of children: 1  . Years of education: N/A   Occupational History  . 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 Smoking status: Never Smoker  . Smokeless tobacco: Never Used  . Alcohol use 1.2 oz/week    2 Glasses of wine per week  . Drug use: No     Comment: former maryjuana use- quit in 1995  . Sexual activity: Yes   Other Topics Concern  . Not on file   Social History Narrative   Marital status: divorced x 2; dating seriously x 3 years.  Boyfriend with HIV.       Children:  1 daughter (15); no grandchildren      Lives:  With boyfriend.        Employment: works at [employer]      Tobacco: none      Alcohol: rarely; socially      Exercise: none; has treadmill and elliptical and bikes   Family History  Problem Relation Age of Onset  . Emphysema Mother        smoker  . Allergies Mother   . Asthma Mother   . Heart disease Mother        AMI as cause of death  . COPD Mother   . Asthma Father   . Heart disease Father        AMI as cause of death  . Diabetes Father   . Asthma Brother   . Heart disease Brother 66       heart failure  . Lung cancer Maternal Grandfather        was a smoker  . Cancer Maternal Grandfather        lung  . Heart disease Paternal Grandmother   . Heart disease Paternal Grandfather   . Colon cancer Neg Hx        Objective:    BP 123/76 (BP Location: Right Arm, Patient Position: Sitting, Cuff Size: Normal)   Pulse 62   Temp 98.9 F (37.2 C) (Oral)   Resp 16   Ht 5' 7.5" (1.715 m)   Wt 148 lb (67.1 kg)   LMP 10/11/2010   SpO2 99%   BMI 22.84 kg/m  Physical Exam  Constitutional: She is oriented to person, place, and time. She appears well-developed and  well-nourished. No distress.  HENT:  Head: Normocephalic and atraumatic.  Right Ear: External ear normal.  Left Ear: External ear normal.  Nose: Nose normal.  Mouth/Throat: Oropharynx is clear and moist.  Eyes: Conjunctivae and EOM are normal. Pupils are equal, round, and reactive to light.  Neck: Normal  range of motion. Neck supple. Carotid bruit is not present. No thyromegaly present.  Cardiovascular: Normal rate, regular rhythm, normal heart sounds and intact distal pulses.  Exam reveals no gallop and no friction rub.   No murmur heard. Pulmonary/Chest: Effort normal and breath sounds normal. She has no wheezes. She has no rales.  Abdominal: Soft. Bowel sounds are normal. She exhibits no distension and no mass. There is no tenderness. There is no rebound and no guarding.  Musculoskeletal:       Lumbar back: She exhibits decreased range of motion and pain. She exhibits no tenderness, no bony tenderness, no swelling, no spasm and normal pulse.  Lymphadenopathy:    She has cervical adenopathy.  Neurological: She is alert and oriented to person, place, and time. No cranial nerve deficit.  Skin: Skin is warm and dry. No rash noted. She is not diaphoretic. No erythema. No pallor.  Psychiatric: She has a normal mood and affect. Her behavior is normal.   Depression screen [facility] 2/9 11/08/2016 02/04/2016 10/21/2015 01/13/2015  Decreased Interest 0 0 0 0  Down, Depressed, Hopeless 0 0 0 0  PHQ - 2 Score 0 0 0 0        Assessment & Plan:   1. Essential hypertension   2. GAD (generalized anxiety disorder)   3. Neck pain on left side   4. Gastroesophageal reflux disease without esophagitis   5. Chronic bilateral low back pain without sciatica   6. High risk sexual behavior   7. Screening for HIV (human immunodeficiency virus)   8. Screening for diabetes mellitus   9. Need for Tdap vaccination    -hypertension controlled; obtain labs; refills provided. -persistent L anterior LAD; obtain CT  neck to rule out underlying mass or pathology;  ENT consultation for persistent swelling/symptoms as well. -anxiety has improved with improved work stressors and relationship improvement.  Refill of Klonopin with plan to wean to off in upcoming year. -s/p orthopedic consultation for chronic lower back pain; hip bursitis and elbow tendonitis has improved.  Refill of Celebrex provided. -refills for other stable chronic medical conditions provided. -prolonged face-to-face for 40 minutes with greater than 50% of time dedicated to counseling and coordination of care.   Orders Placed This Encounter  Procedures  . CT Soft Tissue Neck W Contrast    148 / not diab / no hz kidney ca, no kidney dz / no solitary kidney / nkda to ct dye / no needs / med[employer] Epic order / miriam w pt  11/09/16 pt req this time and date/ miriam    Standing Status:   Future    Number of Occurrences:   1    Standing Expiration Date:   02/08/2018    Order Specific Question:   If indicated for the ordered procedure, I authorize the administration of contrast media per Radiology protocol    Answer:   Yes    Order Specific Question:   Reason for Exam (SYMPTOM  OR DIAGNOSIS REQUIRED)    Answer:   L sided neck soreness with swallowing and at night    Order Specific Question:   Is patient pregnant?    Answer:   No    Comments:   hysterectomy    Order Specific Question:   Preferred imaging location?    Answer:   [facility]    Order Specific Question:   Radiology Contrast Protocol - do NOT remove file path    Answer:   \\[email or web address].pdf  .  Tdap vaccine greater than or equal to 7yo IM  . CBC with Differential/Platelet  . Comprehensive metabolic panel    Order Specific Question:   Has the patient fasted?    Answer:   Yes  . TSH  . Lipid panel    Order Specific Question:   Has the patient fasted?    Answer:   Yes  . Hemoglobin A1c  . HIV antibody  . RPR  . Ambulatory referral to ENT     Referral Priority:   Routine    Referral Type:   Consultation    Referral Reason:   Specialty Services Required    Requested Specialty:   Otolaryngology    Number of Visits Requested:   1  . POCT urinalysis dipstick  . EKG 12-Lead   Meds ordered this encounter  Medications  . Polyethylene Glycol 3350 (MIRALAX PO)    Sig: Take 1 Dose by mouth daily.  . celecoxib (CELEBREX) 200 MG capsule    Sig: Take 1 capsule (200 mg total) by mouth daily.    Dispense:  90 capsule    Refill:  1  . clonazePAM (KLONOPIN) 0.5 MG tablet    Sig: TAKE 1 TABLET BY MOUTH AT BEDTIME AND A 1/2 TABLET DURING THE DAY AS NEEDED    Dispense:  45 tablet    Refill:  5    This request is for a new prescription for a controlled substance as required by Federal/State law.  . pantoprazole (PROTONIX) 40 MG tablet    Sig: Take 1 tablet (40 mg total) by mouth daily.    Dispense:  90 tablet    Refill:  3  . primidone (MYSOLINE) 50 MG tablet    Sig: TAKE 2 TABLETS (100 MG TOTAL) BY MOUTH AT BEDTIME.    Dispense:  180 tablet    Refill:  1  . ondansetron (ZOFRAN-ODT) 8 MG disintegrating tablet    Sig: Take 1 tablet (8 mg total) by mouth every 8 (eight) hours as needed for nausea.    Dispense:  21 tablet    Refill:  2  . EPINEPHrine 0.3 mg/0.3 mL IJ SOAJ injection    Sig: Inject 1 mL (1 mg total) into the muscle once.    Dispense:  1 Device    Refill:  1  . nebivolol (BYSTOLIC) 5 MG tablet    Sig: Take 1 tablet (5 mg total) by mouth daily.    Dispense:  90 tablet    Refill:  1    Needs office visit for refills    Return in about 6 months (around 05/11/2017) for recheck high blood pressure.   Kieu Quiggle Elayne Guerin, M.D. Primary Care at [facility] previously [facility] [place] [address] [place], [place]  [address] [phone] phone [phone] fax

## 2016-11-08 NOTE — Patient Instructions (Addendum)
   IF you received an x-ray today, you will receive an invoice from [facility]. Please contact [facility] at [phone] with questions or concerns regarding your invoice.   IF you received labwork today, you will receive an invoice from [facility]. Please contact [facility] at [phone] with questions or concerns regarding your invoice.   Our billing staff will not be able to assist you with questions regarding bills from these companies.  You will be contacted with the lab results as soon as they are available. The fastest way to get your results is to activate your My Chart account. Instructions are located on the last page of this paperwork. If you have not heard from us regarding the results in 2 weeks, please contact this office.     Low Back Sprain Rehab Ask your health care provider which exercises are safe for you. Do exercises exactly as told by your health care provider and adjust them as directed. It is normal to feel mild stretching, pulling, tightness, or discomfort as you do these exercises, but you should stop right away if you feel sudden pain or your pain gets worse. Do not begin these exercises until told by your health care provider. Stretching and range of motion exercises These exercises warm up your muscles and joints and improve the movement and flexibility of your back. These exercises also help to relieve pain, numbness, and tingling. Exercise A: Lumbar rotation   1. Lie on your back on a firm surface and bend your knees. 2. Straighten your arms out to your sides so each arm forms an "L" shape with a side of your body (a 90 degree angle). 3. Slowly move both of your knees to one side of your body until you feel a stretch in your lower back. Try not to let your shoulders move off of the floor. 4. Hold for __________ seconds. 5. Tense your abdominal muscles and slowly move your knees back to the starting position. 6. Repeat this exercise on the  other side of your body. Repeat __________ times. Complete this exercise __________ times a day. Exercise B: Prone extension on elbows   1. Lie on your abdomen on a firm surface. 2. Prop yourself up on your elbows. 3. Use your arms to help lift your chest up until you feel a gentle stretch in your abdomen and your lower back.  This will place some of your body weight on your elbows. If this is uncomfortable, try stacking pillows under your chest.  Your hips should stay down, against the surface that you are lying on. Keep your hip and back muscles relaxed. 4. Hold for __________ seconds. 5. Slowly relax your upper body and return to the starting position. Repeat __________ times. Complete this exercise __________ times a day. Strengthening exercises These exercises build strength and endurance in your back. Endurance is the ability to use your muscles for a long time, even after they get tired. Exercise C: Pelvic tilt  1. Lie on your back on a firm surface. Bend your knees and keep your feet flat. 2. Tense your abdominal muscles. Tip your pelvis up toward the ceiling and flatten your lower back into the floor.  To help with this exercise, you may place a small towel under your lower back and try to push your back into the towel. 3. Hold for __________ seconds. 4. Let your muscles relax completely before you repeat this exercise. Repeat __________ times. Complete this exercise __________ times a day. Exercise   D: Alternating arm and leg raises   1. Get on your hands and knees on a firm surface. If you are on a hard floor, you may want to use padding to cushion your knees, such as an exercise mat. 2. Line up your arms and legs. Your hands should be below your shoulders, and your knees should be below your hips. 3. Lift your left leg behind you. At the same time, raise your right arm and straighten it in front of you.  Do not lift your leg higher than your hip.  Do not lift your arm  higher than your shoulder.  Keep your abdominal and back muscles tight.  Keep your hips facing the ground.  Do not arch your back.  Keep your balance carefully, and do not hold your breath. 4. Hold for __________ seconds. 5. Slowly return to the starting position and repeat with your right leg and your left arm. Repeat __________ times. Complete this exercise __________ times a day. Exercise E: Abdominal set with straight leg raise   1. Lie on your back on a firm surface. 2. Bend one of your knees and keep your other leg straight. 3. Tense your abdominal muscles and lift your straight leg up, 4-6 inches (10-15 cm) off the ground. 4. Keep your abdominal muscles tight and hold for __________ seconds.  Do not hold your breath.  Do not arch your back. Keep it flat against the ground. 5. Keep your abdominal muscles tense as you slowly lower your leg back to the starting position. 6. Repeat with your other leg. Repeat __________ times. Complete this exercise __________ times a day. Posture and body mechanics   Body mechanics refers to the movements and positions of your body while you do your daily activities. Posture is part of body mechanics. Good posture and healthy body mechanics can help to relieve stress in your body's tissues and joints. Good posture means that your spine is in its natural S-curve position (your spine is neutral), your shoulders are pulled back slightly, and your head is not tipped forward. The following are general guidelines for applying improved posture and body mechanics to your everyday activities. Standing    When standing, keep your spine neutral and your feet about hip-width apart. Keep a slight bend in your knees. Your ears, shoulders, and hips should line up.  When you do a task in which you stand in one place for a long time, place one foot up on a stable object that is 2-4 inches (5-10 cm) high, such as a footstool. This helps keep your spine  neutral. Sitting    When sitting, keep your spine neutral and keep your feet flat on the floor. Use a footrest, if necessary, and keep your thighs parallel to the floor. Avoid rounding your shoulders, and avoid tilting your head forward.  When working at a desk or a computer, keep your desk at a height where your hands are slightly lower than your elbows. Slide your chair under your desk so you are close enough to maintain good posture.  When working at a computer, place your monitor at a height where you are looking straight ahead and you do not have to tilt your head forward or downward to look at the screen. Resting    When lying down and resting, avoid positions that are most painful for you.  If you have pain with activities such as sitting, bending, stooping, or squatting (flexion-based activities), lie in a position in which   your body does not bend very much. For example, avoid curling up on your side with your arms and knees near your chest (fetal position).  If you have pain with activities such as standing for a long time or reaching with your arms (extension-based activities), lie with your spine in a neutral position and bend your knees slightly. Try the following positions:  Lying on your side with a pillow between your knees.  Lying on your back with a pillow under your knees. Lifting    When lifting objects, keep your feet at least shoulder-width apart and tighten your abdominal muscles.  Bend your knees and hips and keep your spine neutral. It is important to lift using the strength of your legs, not your back. Do not lock your knees straight out.  Always ask for help to lift heavy or awkward objects. This information is not intended to replace advice given to you by your health care provider. Make sure you discuss any questions you have with your health care provider. Document Released: 06/05/2005 Document Revised: 02/10/2016 Document Reviewed: 03/17/2015 Elsevier  Interactive Patient Education  2017 Elsevier Inc.  

## 2016-11-09 LAB — CBC WITH DIFFERENTIAL/PLATELET
BASOS ABS: 0 10*3/uL (ref 0.0–0.2)
Basos: 1 %
EOS (ABSOLUTE): 0.1 10*3/uL (ref 0.0–0.4)
Eos: 1 %
Hematocrit: 35.3 % (ref 34.0–46.6)
Hemoglobin: 11.9 g/dL (ref 11.1–15.9)
IMMATURE GRANS (ABS): 0 10*3/uL (ref 0.0–0.1)
IMMATURE GRANULOCYTES: 0 %
LYMPHS: 27 %
Lymphocytes Absolute: 1.8 10*3/uL (ref 0.7–3.1)
MCH: 30.4 pg (ref 26.6–33.0)
MCHC: 33.7 g/dL (ref 31.5–35.7)
MCV: 90 fL (ref 79–97)
MONOS ABS: 0.6 10*3/uL (ref 0.1–0.9)
Monocytes: 9 %
Neutrophils Absolute: 4 10*3/uL (ref 1.4–7.0)
Neutrophils: 62 %
PLATELETS: 326 10*3/uL (ref 150–379)
RBC: 3.92 x10E6/uL (ref 3.77–5.28)
RDW: 13.7 % (ref 12.3–15.4)
WBC: 6.5 10*3/uL (ref 3.4–10.8)

## 2016-11-09 LAB — COMPREHENSIVE METABOLIC PANEL
A/G RATIO: 1.8 (ref 1.2–2.2)
ALT: 10 IU/L (ref 0–32)
AST: 11 IU/L (ref 0–40)
Albumin: 4.8 g/dL (ref 3.5–5.5)
Alkaline Phosphatase: 54 IU/L (ref 39–117)
BUN/Creatinine Ratio: 42 — ABNORMAL HIGH (ref 9–23)
BUN: 25 mg/dL — ABNORMAL HIGH (ref 6–24)
Bilirubin Total: 0.4 mg/dL (ref 0.0–1.2)
CALCIUM: 9.9 mg/dL (ref 8.7–10.2)
CHLORIDE: 100 mmol/L (ref 96–106)
CO2: 28 mmol/L (ref 18–29)
Creatinine, Ser: 0.6 mg/dL (ref 0.57–1.00)
GFR, EST AFRICAN AMERICAN: 121 mL/min/{1.73_m2} (ref >59)
GFR, EST NON AFRICAN AMERICAN: 105 mL/min/{1.73_m2} (ref >59)
GLUCOSE: 93 mg/dL (ref 65–99)
Globulin, Total: 2.6 g/dL (ref 1.5–4.5)
POTASSIUM: 3.7 mmol/L (ref 3.5–5.2)
Sodium: 142 mmol/L (ref 134–144)
TOTAL PROTEIN: 7.4 g/dL (ref 6.0–8.5)

## 2016-11-09 LAB — LIPID PANEL
CHOL/HDL RATIO: 2.9 ratio (ref 0.0–4.4)
Cholesterol, Total: 137 mg/dL (ref 100–199)
HDL: 48 mg/dL (ref >39)
LDL CALC: 73 mg/dL (ref 0–99)
TRIGLYCERIDES: 82 mg/dL (ref 0–149)
VLDL Cholesterol Cal: 16 mg/dL (ref 5–40)

## 2016-11-09 LAB — RPR: RPR Ser Ql: NONREACTIVE

## 2016-11-09 LAB — HEMOGLOBIN A1C
Est. average glucose Bld gHb Est-mCnc: 100 mg/dL
Hgb A1c MFr Bld: 5.1 % (ref 4.8–5.6)

## 2016-11-09 LAB — TSH: TSH: 1.19 u[IU]/mL (ref 0.450–4.500)

## 2016-11-09 LAB — HIV ANTIBODY (ROUTINE TESTING W REFLEX): HIV Screen 4th Generation wRfx: NONREACTIVE

## 2016-11-14 ENCOUNTER — Other Ambulatory Visit: Payer: Self-pay | Admitting: Family Medicine

## 2016-11-14 DIAGNOSIS — K219 Gastro-esophageal reflux disease without esophagitis: Secondary | ICD-10-CM

## 2016-11-15 ENCOUNTER — Other Ambulatory Visit: Payer: Self-pay

## 2016-11-15 MED ORDER — EPINEPHRINE 0.3 MG/0.3ML IJ SOAJ: 0.3000 mg | Freq: Once | INTRAMUSCULAR | 0 refills | Status: AC

## 2016-11-15 NOTE — Telephone Encounter (Signed)
Needs refill, last one states inject 39ml, should be inject0.54ml

## 2016-11-29 ENCOUNTER — Ambulatory Visit
Admission: RE | Admit: 2016-11-29 | Discharge: 2016-11-29 | Disposition: A | Discharge status: Home / Self Care | Payer: PRIVATE HEALTH INSURANCE | Source: Ambulatory Visit | Attending: Family Medicine | Admitting: Family Medicine

## 2016-11-29 DIAGNOSIS — M542 Cervicalgia: Secondary | ICD-10-CM

## 2016-11-29 MED ORDER — IOPAMIDOL (ISOVUE-300) INJECTION 61%: 75.0000 mL | Freq: Once | INTRAVENOUS | Status: DC | PRN

## 2016-12-27 ENCOUNTER — Ambulatory Visit (INDEPENDENT_AMBULATORY_CARE_PROVIDER_SITE_OTHER): Payer: PRIVATE HEALTH INSURANCE | Admitting: Orthopaedic Surgery

## 2017-01-15 ENCOUNTER — Other Ambulatory Visit: Payer: Self-pay | Admitting: Family Medicine

## 2017-01-15 DIAGNOSIS — K219 Gastro-esophageal reflux disease without esophagitis: Secondary | ICD-10-CM

## 2017-01-29 ENCOUNTER — Other Ambulatory Visit: Payer: Self-pay | Admitting: Family Medicine

## 2017-01-29 DIAGNOSIS — K219 Gastro-esophageal reflux disease without esophagitis: Secondary | ICD-10-CM

## 2017-02-02 NOTE — Telephone Encounter (Signed)
Refill request for ranitidine 150 mg denied. Pt changed to Protonix 40 mg 1 qd #90 w/ 3 refills on 11/08/16  See note.

## 2017-02-05 ENCOUNTER — Other Ambulatory Visit: Payer: Self-pay | Admitting: Family Medicine

## 2017-02-05 DIAGNOSIS — K219 Gastro-esophageal reflux disease without esophagitis: Secondary | ICD-10-CM

## 2017-02-17 ENCOUNTER — Other Ambulatory Visit: Payer: Self-pay

## 2017-02-17 DIAGNOSIS — K219 Gastro-esophageal reflux disease without esophagitis: Secondary | ICD-10-CM

## 2017-02-17 MED ORDER — RANITIDINE HCL 150 MG PO TABS: ORAL_TABLET | ORAL | 1 refills | Status: DC

## 2017-04-09 ENCOUNTER — Ambulatory Visit (INDEPENDENT_AMBULATORY_CARE_PROVIDER_SITE_OTHER): Payer: PRIVATE HEALTH INSURANCE | Admitting: Orthopaedic Surgery

## 2017-04-09 ENCOUNTER — Ambulatory Visit (INDEPENDENT_AMBULATORY_CARE_PROVIDER_SITE_OTHER): Payer: PRIVATE HEALTH INSURANCE

## 2017-04-09 ENCOUNTER — Encounter (INDEPENDENT_AMBULATORY_CARE_PROVIDER_SITE_OTHER): Payer: Self-pay | Admitting: Orthopaedic Surgery

## 2017-04-09 DIAGNOSIS — M25562 Pain in left knee: Secondary | ICD-10-CM

## 2017-04-09 MED ORDER — METHYLPREDNISOLONE ACETATE 40 MG/ML IJ SUSP
40.0000 mg | INTRAMUSCULAR | Status: AC | PRN
  Administered 2017-04-09: 40 mg via INTRA_ARTICULAR

## 2017-04-09 MED ORDER — LIDOCAINE HCL 1 % IJ SOLN
3.0000 mL | INTRAMUSCULAR | Status: AC | PRN
  Administered 2017-04-09: 3 mL

## 2017-04-09 NOTE — Progress Notes (Signed)
Office Visit Note   Patient: Sharon [name]           Date of Birth: 1966-03-09           MRN: [id number] Visit Date: 04/09/2017              Requested by: Wardell Honour, MD [address] [place], [place] [address] PCP: Wardell Honour, MD   Assessment & Plan: Visit Diagnoses:  1. Acute pain of left knee     Plan: I talked with her about trying a steroid injection in her knee today this E that would help.  I would like to see her back in just 2 weeks because of this is not helped I would recommend an MRI of her knee to rule out a meniscal tear.  All questions and concerns were answered and addressed.  Follow-Up Instructions: Return in about 2 weeks (around 04/23/2017).   Orders:  Orders Placed This Encounter  Procedures  . Large Joint Injection/Arthrocentesis  . XR KNEE 3 VIEW LEFT   No orders of the defined types were placed in this encounter.     Procedures: Large Joint Inj Date/Time: 04/09/2017 3:56 PM Performed by: Mcarthur Rossetti Authorized by: Mcarthur Rossetti   Location:  Knee Site:  L knee Ultrasound Guidance: No   Fluoroscopic Guidance: No   Arthrogram: No   Medications:  3 mL lidocaine 1 %; 40 mg methylPREDNISolone acetate 40 MG/ML     Clinical Data: No additional findings.   Subjective: Chief Complaint  Patient presents with  . Left Knee - Pain   The patient comes in with chief complaint of left knee pain.  This is been swelling as well and hurts in the back of her knee.  It hurts with pivoting activities.  She of a blood clot in the past which she thinks was in that leg.  She actually seems the vein and vascular specialist next week for follow-up being that she is off blood thinning medication.  She said a few weeks ago her knee was not hurting her.  She does report that it has been swelling something HPI  Review of Systems She currently denies any headache, chest pain, shortness of breath, fever, chills, nausea,  vomiting  Objective: Vital Signs: LMP 10/11/2010   Physical Exam She is alert and oriented x3 and in no acute distress Ortho Exam Examination of her left knee she has a slight effusion.  She has a positive Murray sign to the medial compartment and lateral compartment of her knee.  Her range of motion is full but past 90 degrees of flexion is severely painful to her.  Her calf is soft.  I cannot palpate any masses. Specialty Comments:  No specialty comments available.  Imaging: No results found.   PMFS History: Patient Active Problem List   Diagnosis Date Noted  . Acute pain of left knee 04/09/2017  . Trochanteric bursitis, right hip 09/27/2016  . Pain in right elbow 04/25/2016  . Pain in right hip 04/25/2016  . Neck pain 04/25/2016  . Hypersomnia 05/21/2015  . GAD (generalized anxiety disorder) 05/31/2014  . Chest pain 05/09/2011  . Anal fissure 05/05/2011  . Cough 10/25/2010  . Dyspnea 10/13/2010  . Hypertension 10/13/2010   Past Medical History:  Diagnosis Date  . Allergy   . Anal fissure   . Anxiety   . Arthritis   . Blood clot in vein    left leg  . Detached retina   .  DVT (deep venous thrombosis) (HCC)    left leg  . Endometriosis   . GERD (gastroesophageal reflux disease)   . Heart murmur   . History of hemorrhoids   . Hypertension   . Pulmonary embolism (HCC)     Family History  Problem Relation Age of Onset  . Emphysema Mother        smoker  . Allergies Mother   . Asthma Mother   . Heart disease Mother        AMI as cause of death  . COPD Mother   . Asthma Father   . Heart disease Father        AMI as cause of death  . Diabetes Father   . Asthma Brother   . Heart disease Brother 6       heart failure  . Lung cancer Maternal Grandfather        was a smoker  . Cancer Maternal Grandfather        lung  . Heart disease Paternal Grandmother   . Heart disease Paternal Grandfather   . Colon cancer Neg Hx     Past Surgical History:  Procedure  Laterality Date  . ABDOMINAL HYSTERECTOMY    . CATARACT EXTRACTION Bilateral 2015  . ENDOMETRIAL ABLATION    . [place]  2011  . INCONTINENCE SURGERY    . INNER EAR SURGERY    . RECTAL PROLAPSE REPAIR    . RETINAL DETACHMENT SURGERY Bilateral 2000  . TUBAL LIGATION    . VEIN SURGERY Left 04/2010   Social History   Occupational History  . 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 Smoking status: Never Smoker  . Smokeless tobacco: Never Used  . Alcohol use 1.2 oz/week    2 Glasses of wine per week  . Drug use: No     Comment: former maryjuana use- quit in 1995  . Sexual activity: Yes

## 2017-05-03 ENCOUNTER — Ambulatory Visit (INDEPENDENT_AMBULATORY_CARE_PROVIDER_SITE_OTHER): Payer: PRIVATE HEALTH INSURANCE | Admitting: Physician Assistant

## 2017-05-03 ENCOUNTER — Encounter (INDEPENDENT_AMBULATORY_CARE_PROVIDER_SITE_OTHER): Payer: Self-pay | Admitting: Orthopaedic Surgery

## 2017-05-03 DIAGNOSIS — M25562 Pain in left knee: Secondary | ICD-10-CM | POA: Diagnosis not present

## 2017-05-03 NOTE — Progress Notes (Signed)
Office Visit Note   Patient: Sharon [name]           Date of Birth: 1965-10-06           MRN: [id number] Visit Date: 05/03/2017              Requested by: Wardell Honour, MD [address] [place], [place] [address] PCP: Wardell Honour, MD   Assessment & Plan: Visit Diagnoses:  1. Acute pain of left knee     Plan: We will obtain an MRI of her left knee to rule out meniscal tear due to the fact she continues to have pain in the knee and some mechanical symptoms of the knee despite conservative treatment and time.  She will follow-up after the MRI to go over the results.  Continue with her ibuprofen. Pull over knee sleeve was given to help with stability.   Follow-Up Instructions: Return for after MRI.   Orders:  No orders of the defined types were placed in this encounter.  No orders of the defined types were placed in this encounter.     Procedures: No procedures performed   Clinical Data: No additional findings.   Subjective: Chief Complaint  Patient presents with  . Left Knee - Pain, Follow-up    HPI Sharon [name] returns today follow-up of her left knee status post injection on 04/09/2017.  She states that the knee felt good for about a week and now her pain is coming back.  She states the knee feels weak and that it feels like it may give way.  She has some painful like popping sensation in the knee.  No catching or locking.  Most of her pain is medial aspect of the knee and she has to sleep with a pillow between her legs.  She has been taking ibuprofen for the pain which helps for short.  The time and using Biofreeze which helps some also.  She states that pivoting motions continue to bother the knee.  She does report that she had a Doppler done of her left leg and this was negative for DVT but did show a Baker's cyst. Review of Systems See HPI otherwise negative  Objective: Vital Signs: LMP 10/11/2010   Physical Exam  Constitutional: She is oriented to  person, place, and time. She appears well-developed and well-nourished. No distress.  Neurological: She is alert and oriented to person, place, and time.  Skin: She is not diaphoretic.  Psychiatric: She has a normal mood and affect.    Ortho Exam Left knee tenderness along medial joint line.  No instability valgus varus stressing.  Full extension.  The flexion causes pain medial aspect of the knee.  Slight effusion.  Calf supple nontender Specialty Comments:  No specialty comments available.  Imaging: No results found.   PMFS History: Patient Active Problem List   Diagnosis Date Noted  . Acute pain of left knee 04/09/2017  . Trochanteric bursitis, right hip 09/27/2016  . Pain in right elbow 04/25/2016  . Pain in right hip 04/25/2016  . Neck pain 04/25/2016  . Hypersomnia 05/21/2015  . GAD (generalized anxiety disorder) 05/31/2014  . Chest pain 05/09/2011  . Anal fissure 05/05/2011  . Cough 10/25/2010  . Dyspnea 10/13/2010  . Hypertension 10/13/2010   Past Medical History:  Diagnosis Date  . Allergy   . Anal fissure   . Anxiety   . Arthritis   . Blood clot in vein    left leg  .  Detached retina   . DVT (deep venous thrombosis) (HCC)    left leg  . Endometriosis   . GERD (gastroesophageal reflux disease)   . Heart murmur   . History of hemorrhoids   . Hypertension   . Pulmonary embolism (HCC)     Family History  Problem Relation Age of Onset  . Emphysema Mother        smoker  . Allergies Mother   . Asthma Mother   . Heart disease Mother        AMI as cause of death  . COPD Mother   . Asthma Father   . Heart disease Father        AMI as cause of death  . Diabetes Father   . Asthma Brother   . Heart disease Brother 35       heart failure  . Lung cancer Maternal Grandfather        was a smoker  . Cancer Maternal Grandfather        lung  . Heart disease Paternal Grandmother   . Heart disease Paternal Grandfather   . Colon cancer Neg Hx     Past  Surgical History:  Procedure Laterality Date  . ABDOMINAL HYSTERECTOMY    . CATARACT EXTRACTION Bilateral 2015  . ENDOMETRIAL ABLATION    . [place]  2011  . INCONTINENCE SURGERY    . INNER EAR SURGERY    . RECTAL PROLAPSE REPAIR    . RETINAL DETACHMENT SURGERY Bilateral 2000  . TUBAL LIGATION    . VEIN SURGERY Left 04/2010   Social History   Occupational History  . Occupation: Restaurant manager, fast food: [employer]  Tobacco Use  . Smoking status: Never Smoker  . Smokeless tobacco: Never Used  Substance and Sexual Activity  . Alcohol use: Yes    Alcohol/week: 1.2 oz    Types: 2 Glasses of wine per week  . Drug use: No    Comment: former maryjuana use- quit in 1995  . Sexual activity: Yes

## 2017-05-04 ENCOUNTER — Other Ambulatory Visit (INDEPENDENT_AMBULATORY_CARE_PROVIDER_SITE_OTHER): Payer: Self-pay

## 2017-05-04 DIAGNOSIS — G8929 Other chronic pain: Secondary | ICD-10-CM

## 2017-05-04 DIAGNOSIS — M25562 Pain in left knee: Principal | ICD-10-CM

## 2017-05-08 ENCOUNTER — Ambulatory Visit: Payer: PRIVATE HEALTH INSURANCE | Admitting: Family Medicine

## 2017-05-21 ENCOUNTER — Other Ambulatory Visit: Payer: Self-pay | Admitting: Family Medicine

## 2017-05-21 DIAGNOSIS — I10 Essential (primary) hypertension: Secondary | ICD-10-CM

## 2017-05-22 ENCOUNTER — Other Ambulatory Visit: Payer: PRIVATE HEALTH INSURANCE

## 2017-05-23 ENCOUNTER — Other Ambulatory Visit: Payer: Self-pay | Admitting: Urology

## 2017-05-23 ENCOUNTER — Ambulatory Visit (HOSPITAL_COMMUNITY): Payer: PRIVATE HEALTH INSURANCE

## 2017-05-23 ENCOUNTER — Other Ambulatory Visit: Payer: Self-pay

## 2017-05-23 ENCOUNTER — Ambulatory Visit (HOSPITAL_COMMUNITY): Payer: PRIVATE HEALTH INSURANCE | Admitting: Certified Registered"

## 2017-05-23 ENCOUNTER — Encounter (HOSPITAL_COMMUNITY): Payer: Self-pay | Admitting: Certified Registered"

## 2017-05-23 ENCOUNTER — Encounter (HOSPITAL_COMMUNITY)
Admission: RE | Disposition: A | Discharge status: Home / Self Care | Payer: Self-pay | Source: Ambulatory Visit | Attending: Internal Medicine

## 2017-05-23 ENCOUNTER — Inpatient Hospital Stay (HOSPITAL_COMMUNITY)
Admission: RE | Admit: 2017-05-23 | Discharge: 2017-05-25 | DRG: 854 | Disposition: A | Discharge status: Home / Self Care | Payer: PRIVATE HEALTH INSURANCE | Source: Ambulatory Visit | Attending: Internal Medicine | Admitting: Internal Medicine

## 2017-05-23 DIAGNOSIS — Z86711 Personal history of pulmonary embolism: Secondary | ICD-10-CM | POA: Diagnosis not present

## 2017-05-23 DIAGNOSIS — Z825 Family history of asthma and other chronic lower respiratory diseases: Secondary | ICD-10-CM | POA: Diagnosis not present

## 2017-05-23 DIAGNOSIS — Z880 Allergy status to penicillin: Secondary | ICD-10-CM | POA: Diagnosis not present

## 2017-05-23 DIAGNOSIS — Z8249 Family history of ischemic heart disease and other diseases of the circulatory system: Secondary | ICD-10-CM

## 2017-05-23 DIAGNOSIS — M1990 Unspecified osteoarthritis, unspecified site: Secondary | ICD-10-CM | POA: Diagnosis present

## 2017-05-23 DIAGNOSIS — K219 Gastro-esophageal reflux disease without esophagitis: Secondary | ICD-10-CM | POA: Diagnosis present

## 2017-05-23 DIAGNOSIS — Z9842 Cataract extraction status, left eye: Secondary | ICD-10-CM

## 2017-05-23 DIAGNOSIS — H409 Unspecified glaucoma: Secondary | ICD-10-CM | POA: Diagnosis present

## 2017-05-23 DIAGNOSIS — Z86718 Personal history of other venous thrombosis and embolism: Secondary | ICD-10-CM | POA: Diagnosis not present

## 2017-05-23 DIAGNOSIS — R0602 Shortness of breath: Secondary | ICD-10-CM | POA: Diagnosis not present

## 2017-05-23 DIAGNOSIS — N390 Urinary tract infection, site not specified: Secondary | ICD-10-CM

## 2017-05-23 DIAGNOSIS — Z888 Allergy status to other drugs, medicaments and biological substances status: Secondary | ICD-10-CM

## 2017-05-23 DIAGNOSIS — Z9841 Cataract extraction status, right eye: Secondary | ICD-10-CM | POA: Diagnosis not present

## 2017-05-23 DIAGNOSIS — F411 Generalized anxiety disorder: Secondary | ICD-10-CM | POA: Diagnosis present

## 2017-05-23 DIAGNOSIS — N200 Calculus of kidney: Secondary | ICD-10-CM

## 2017-05-23 DIAGNOSIS — Z8262 Family history of osteoporosis: Secondary | ICD-10-CM | POA: Diagnosis not present

## 2017-05-23 DIAGNOSIS — I10 Essential (primary) hypertension: Secondary | ICD-10-CM | POA: Diagnosis present

## 2017-05-23 DIAGNOSIS — A419 Sepsis, unspecified organism: Secondary | ICD-10-CM | POA: Diagnosis present

## 2017-05-23 DIAGNOSIS — Z8619 Personal history of other infectious and parasitic diseases: Secondary | ICD-10-CM | POA: Diagnosis present

## 2017-05-23 DIAGNOSIS — Z833 Family history of diabetes mellitus: Secondary | ICD-10-CM

## 2017-05-23 DIAGNOSIS — A021 Salmonella sepsis: Secondary | ICD-10-CM | POA: Diagnosis not present

## 2017-05-23 DIAGNOSIS — Z885 Allergy status to narcotic agent status: Secondary | ICD-10-CM

## 2017-05-23 DIAGNOSIS — N136 Pyonephrosis: Secondary | ICD-10-CM | POA: Diagnosis present

## 2017-05-23 DIAGNOSIS — B009 Herpesviral infection, unspecified: Secondary | ICD-10-CM | POA: Diagnosis present

## 2017-05-23 DIAGNOSIS — N201 Calculus of ureter: Secondary | ICD-10-CM | POA: Diagnosis not present

## 2017-05-23 HISTORY — PX: CYSTOSCOPY W/ URETERAL STENT PLACEMENT: SHX1429

## 2017-05-23 LAB — BASIC METABOLIC PANEL
ANION GAP: 14 (ref 5–15)
BUN: 22 mg/dL — AB (ref 6–20)
CALCIUM: 9.8 mg/dL (ref 8.9–10.3)
CO2: 27 mmol/L (ref 22–32)
CREATININE: 1 mg/dL (ref 0.44–1.00)
Chloride: 95 mmol/L — ABNORMAL LOW (ref 101–111)
GFR calc Af Amer: >60 mL/min (ref >60)
GLUCOSE: 96 mg/dL (ref 65–99)
Potassium: 3.2 mmol/L — ABNORMAL LOW (ref 3.5–5.1)
Sodium: 136 mmol/L (ref 135–145)

## 2017-05-23 LAB — COMPREHENSIVE METABOLIC PANEL
ALK PHOS: 56 U/L (ref 38–126)
ALT: 13 U/L — AB (ref 14–54)
AST: 15 U/L (ref 15–41)
Albumin: 3.7 g/dL (ref 3.5–5.0)
Anion gap: 11 (ref 5–15)
BUN: 21 mg/dL — AB (ref 6–20)
CALCIUM: 8.8 mg/dL — AB (ref 8.9–10.3)
CHLORIDE: 95 mmol/L — AB (ref 101–111)
CO2: 26 mmol/L (ref 22–32)
CREATININE: 0.89 mg/dL (ref 0.44–1.00)
Glucose, Bld: 153 mg/dL — ABNORMAL HIGH (ref 65–99)
Potassium: 3.4 mmol/L — ABNORMAL LOW (ref 3.5–5.1)
Sodium: 132 mmol/L — ABNORMAL LOW (ref 135–145)
Total Bilirubin: 1 mg/dL (ref 0.3–1.2)
Total Protein: 7.1 g/dL (ref 6.5–8.1)

## 2017-05-23 LAB — CBC
HCT: 37.8 % (ref 36.0–46.0)
Hemoglobin: 13.2 g/dL (ref 12.0–15.0)
MCH: 30.8 pg (ref 26.0–34.0)
MCHC: 34.9 g/dL (ref 30.0–36.0)
MCV: 88.1 fL (ref 78.0–100.0)
Platelets: 223 10*3/uL (ref 150–400)
RBC: 4.29 MIL/uL (ref 3.87–5.11)
RDW: 13.1 % (ref 11.5–15.5)
WBC: 16 10*3/uL — ABNORMAL HIGH (ref 4.0–10.5)

## 2017-05-23 LAB — MAGNESIUM: Magnesium: 2 mg/dL (ref 1.7–2.4)

## 2017-05-23 SURGERY — CYSTOSCOPY, WITH RETROGRADE PYELOGRAM AND URETERAL STENT INSERTION
Anesthesia: General | Laterality: Right

## 2017-05-23 MED ORDER — ONDANSETRON 4 MG PO TBDP: 8.0000 mg | ORAL_TABLET | Freq: Three times a day (TID) | ORAL | Status: DC | PRN

## 2017-05-23 MED ORDER — LEVOFLOXACIN IN D5W 750 MG/150ML IV SOLN
750.0000 mg | INTRAVENOUS | Status: DC
  Administered 2017-05-24: 750 mg via INTRAVENOUS
  Filled 2017-05-23: qty 150

## 2017-05-23 MED ORDER — VANCOMYCIN HCL IN DEXTROSE 1-5 GM/200ML-% IV SOLN
INTRAVENOUS | Status: AC
  Filled 2017-05-23: qty 200

## 2017-05-23 MED ORDER — HYDROCODONE-ACETAMINOPHEN 5-325 MG PO TABS
1.0000 | ORAL_TABLET | ORAL | Status: DC | PRN
  Administered 2017-05-24 (×2): 2 via ORAL
  Administered 2017-05-24 (×2): 1 via ORAL
  Filled 2017-05-23 (×3): qty 2

## 2017-05-23 MED ORDER — LIDOCAINE 2% (20 MG/ML) 5 ML SYRINGE
INTRAMUSCULAR | Status: AC
  Filled 2017-05-23: qty 5

## 2017-05-23 MED ORDER — PRIMIDONE 50 MG PO TABS
100.0000 mg | ORAL_TABLET | Freq: Every day | ORAL | Status: DC
  Administered 2017-05-24 – 2017-05-25 (×2): 100 mg via ORAL
  Filled 2017-05-23 (×3): qty 2

## 2017-05-23 MED ORDER — IOPAMIDOL (ISOVUE-300) INJECTION 61%
INTRAVENOUS | Status: DC | PRN
  Administered 2017-05-23: 4 mL

## 2017-05-23 MED ORDER — SODIUM CHLORIDE 0.9 % IV SOLN
INTRAVENOUS | Status: DC
  Administered 2017-05-24 – 2017-05-25 (×2): via INTRAVENOUS

## 2017-05-23 MED ORDER — PROPOFOL 10 MG/ML IV BOLUS
INTRAVENOUS | Status: AC
  Filled 2017-05-23: qty 20

## 2017-05-23 MED ORDER — PROMETHAZINE HCL 25 MG/ML IJ SOLN: 6.2500 mg | INTRAMUSCULAR | Status: DC | PRN

## 2017-05-23 MED ORDER — SODIUM CHLORIDE 0.9 % IR SOLN
Status: DC | PRN
  Administered 2017-05-23: 3000 mL via INTRAVESICAL

## 2017-05-23 MED ORDER — ACETAMINOPHEN 325 MG PO TABS: 650.0000 mg | ORAL_TABLET | ORAL | Status: DC | PRN

## 2017-05-23 MED ORDER — ACETAMINOPHEN 325 MG PO TABS
650.0000 mg | ORAL_TABLET | Freq: Four times a day (QID) | ORAL | Status: DC | PRN
  Administered 2017-05-23 – 2017-05-25 (×5): 650 mg via ORAL
  Filled 2017-05-23 (×6): qty 2

## 2017-05-23 MED ORDER — DEXAMETHASONE SODIUM PHOSPHATE 10 MG/ML IJ SOLN
INTRAMUSCULAR | Status: DC | PRN
Start: 2017-05-23 — End: 2017-05-23
  Administered 2017-05-23: 10 mg via INTRAVENOUS

## 2017-05-23 MED ORDER — LACTATED RINGERS IV SOLN: INTRAVENOUS | Status: DC

## 2017-05-23 MED ORDER — HYDROMORPHONE HCL 1 MG/ML IJ SOLN
0.2500 mg | INTRAMUSCULAR | Status: DC | PRN
Start: 2017-05-23 — End: 2017-05-23

## 2017-05-23 MED ORDER — SUGAMMADEX SODIUM 200 MG/2ML IV SOLN
INTRAVENOUS | Status: DC | PRN
  Administered 2017-05-23: 150 mg via INTRAVENOUS

## 2017-05-23 MED ORDER — PANTOPRAZOLE SODIUM 40 MG PO TBEC
40.0000 mg | DELAYED_RELEASE_TABLET | Freq: Every day | ORAL | Status: DC
  Administered 2017-05-24 – 2017-05-25 (×2): 40 mg via ORAL
  Filled 2017-05-23 (×2): qty 1

## 2017-05-23 MED ORDER — ACETAMINOPHEN 500 MG PO TABS: 500.0000 mg | ORAL_TABLET | Freq: Two times a day (BID) | ORAL | Status: DC

## 2017-05-23 MED ORDER — MIDAZOLAM HCL 2 MG/2ML IJ SOLN
INTRAMUSCULAR | Status: AC
  Filled 2017-05-23: qty 2

## 2017-05-23 MED ORDER — MORPHINE SULFATE (PF) 4 MG/ML IV SOLN: 2.0000 mg | INTRAVENOUS | Status: DC | PRN

## 2017-05-23 MED ORDER — ONDANSETRON HCL 4 MG/2ML IJ SOLN
INTRAMUSCULAR | Status: DC | PRN
  Administered 2017-05-23: 4 mg via INTRAVENOUS

## 2017-05-23 MED ORDER — POTASSIUM CHLORIDE CRYS ER 20 MEQ PO TBCR
40.0000 meq | EXTENDED_RELEASE_TABLET | Freq: Once | ORAL | Status: AC
  Administered 2017-05-23: 40 meq via ORAL
  Filled 2017-05-23: qty 2

## 2017-05-23 MED ORDER — ONDANSETRON HCL 4 MG/2ML IJ SOLN: 4.0000 mg | INTRAMUSCULAR | Status: DC | PRN

## 2017-05-23 MED ORDER — ACETAMINOPHEN 10 MG/ML IV SOLN
INTRAVENOUS | Status: AC
  Administered 2017-05-23: 1000 mg
  Filled 2017-05-23: qty 100

## 2017-05-23 MED ORDER — ROCURONIUM BROMIDE 50 MG/5ML IV SOSY
PREFILLED_SYRINGE | INTRAVENOUS | Status: DC | PRN
  Administered 2017-05-23: 30 mg via INTRAVENOUS

## 2017-05-23 MED ORDER — MIDAZOLAM HCL 5 MG/5ML IJ SOLN
INTRAMUSCULAR | Status: DC | PRN
  Administered 2017-05-23: 2 mg via INTRAVENOUS

## 2017-05-23 MED ORDER — FAMOTIDINE 20 MG PO TABS
20.0000 mg | ORAL_TABLET | Freq: Every day | ORAL | Status: DC
  Administered 2017-05-24 – 2017-05-25 (×2): 20 mg via ORAL
  Filled 2017-05-23 (×2): qty 1

## 2017-05-23 MED ORDER — CLONAZEPAM 0.5 MG PO TABS
0.5000 mg | ORAL_TABLET | Freq: Three times a day (TID) | ORAL | Status: DC | PRN
  Administered 2017-05-23 – 2017-05-24 (×2): 0.5 mg via ORAL
  Filled 2017-05-23 (×2): qty 1

## 2017-05-23 MED ORDER — PROPOFOL 10 MG/ML IV BOLUS
INTRAVENOUS | Status: DC | PRN
  Administered 2017-05-23: 130 mg via INTRAVENOUS

## 2017-05-23 MED ORDER — VITAMIN B-12 1000 MCG PO TABS
1000.0000 ug | ORAL_TABLET | Freq: Every day | ORAL | Status: DC
  Administered 2017-05-24 – 2017-05-25 (×2): 1000 ug via ORAL
  Filled 2017-05-23 (×2): qty 1

## 2017-05-23 MED ORDER — POTASSIUM CHLORIDE IN NACL 20-0.45 MEQ/L-% IV SOLN
INTRAVENOUS | Status: DC
  Filled 2017-05-23: qty 1000

## 2017-05-23 MED ORDER — MEPERIDINE HCL 50 MG/ML IJ SOLN: 6.2500 mg | INTRAMUSCULAR | Status: DC | PRN

## 2017-05-23 MED ORDER — NEBIVOLOL HCL 5 MG PO TABS
5.0000 mg | ORAL_TABLET | Freq: Every day | ORAL | Status: DC
  Administered 2017-05-23 – 2017-05-24 (×2): 5 mg via ORAL
  Filled 2017-05-23 (×3): qty 1

## 2017-05-23 MED ORDER — SCOPOLAMINE 1 MG/3DAYS TD PT72
MEDICATED_PATCH | TRANSDERMAL | Status: AC
  Filled 2017-05-23: qty 1

## 2017-05-23 MED ORDER — LIDOCAINE 2% (20 MG/ML) 5 ML SYRINGE
INTRAMUSCULAR | Status: DC | PRN
  Administered 2017-05-23: 80 mg via INTRAVENOUS

## 2017-05-23 MED ORDER — LACTATED RINGERS IV SOLN
INTRAVENOUS | Status: DC
  Administered 2017-05-23: 14:00:00 via INTRAVENOUS

## 2017-05-23 MED ORDER — ACYCLOVIR 400 MG PO TABS
400.0000 mg | ORAL_TABLET | Freq: Every day | ORAL | Status: DC
  Administered 2017-05-23 – 2017-05-25 (×3): 400 mg via ORAL
  Filled 2017-05-23 (×3): qty 1

## 2017-05-23 MED ORDER — FENTANYL CITRATE (PF) 100 MCG/2ML IJ SOLN
INTRAMUSCULAR | Status: DC | PRN
  Administered 2017-05-23 (×2): 50 ug via INTRAVENOUS

## 2017-05-23 MED ORDER — VITAMIN D3 25 MCG (1000 UNIT) PO TABS
2000.0000 [IU] | ORAL_TABLET | Freq: Every day | ORAL | Status: DC
  Administered 2017-05-24 – 2017-05-25 (×2): 2000 [IU] via ORAL
  Filled 2017-05-23 (×2): qty 2

## 2017-05-23 MED ORDER — FENTANYL CITRATE (PF) 100 MCG/2ML IJ SOLN
INTRAMUSCULAR | Status: AC
  Filled 2017-05-23: qty 2

## 2017-05-23 MED ORDER — ONDANSETRON HCL 4 MG/2ML IJ SOLN
INTRAMUSCULAR | Status: AC
  Administered 2017-05-23: 4 mg
  Filled 2017-05-23: qty 2

## 2017-05-23 MED ORDER — SCOPOLAMINE 1 MG/3DAYS TD PT72: 1.0000 | MEDICATED_PATCH | TRANSDERMAL | Status: DC

## 2017-05-23 MED ORDER — VANCOMYCIN HCL 10 G IV SOLR
1250.0000 mg | INTRAVENOUS | Status: DC
  Administered 2017-05-24 – 2017-05-25 (×2): 1250 mg via INTRAVENOUS
  Filled 2017-05-23 (×2): qty 1250

## 2017-05-23 MED ORDER — GENTAMICIN SULFATE 40 MG/ML IJ SOLN
5.0000 mg/kg | INTRAVENOUS | Status: AC
  Administered 2017-05-23: 357.2 mg via INTRAVENOUS
  Filled 2017-05-23: qty 9

## 2017-05-23 MED ORDER — ACETAMINOPHEN 650 MG RE SUPP: 650.0000 mg | Freq: Four times a day (QID) | RECTAL | Status: DC | PRN

## 2017-05-23 MED ORDER — VANCOMYCIN HCL IN DEXTROSE 1-5 GM/200ML-% IV SOLN
1000.0000 mg | Freq: Once | INTRAVENOUS | Status: AC
  Administered 2017-05-23: 1000 mg via INTRAVENOUS

## 2017-05-23 MED ORDER — VITAMIN C 500 MG PO TABS
1000.0000 mg | ORAL_TABLET | Freq: Every day | ORAL | Status: DC
  Administered 2017-05-24 – 2017-05-25 (×2): 1000 mg via ORAL
  Filled 2017-05-23 (×2): qty 2

## 2017-05-23 MED ORDER — PROMETHAZINE HCL 25 MG PO TABS: 12.5000 mg | ORAL_TABLET | Freq: Four times a day (QID) | ORAL | Status: DC | PRN

## 2017-05-23 MED ORDER — SENNOSIDES-DOCUSATE SODIUM 8.6-50 MG PO TABS: 1.0000 | ORAL_TABLET | Freq: Every evening | ORAL | Status: DC | PRN

## 2017-05-23 MED ORDER — SUCCINYLCHOLINE CHLORIDE 200 MG/10ML IV SOSY
PREFILLED_SYRINGE | INTRAVENOUS | Status: DC | PRN
  Administered 2017-05-23: 130 mg via INTRAVENOUS

## 2017-05-23 SURGICAL SUPPLY — 13 items
BAG URO CATCHER STRL LF (MISCELLANEOUS) ×3 IMPLANT
CATH INTERMIT  6FR 70CM (CATHETERS) ×3 IMPLANT
CLOTH BEACON ORANGE TIMEOUT ST (SAFETY) ×3 IMPLANT
COVER FOOTSWITCH UNIV (MISCELLANEOUS) IMPLANT
COVER SURGICAL LIGHT HANDLE (MISCELLANEOUS) ×3 IMPLANT
GLOVE BIOGEL M STRL SZ7.5 (GLOVE) ×3 IMPLANT
GOWN STRL REUS W/TWL LRG LVL3 (GOWN DISPOSABLE) ×6 IMPLANT
GUIDEWIRE STR DUAL SENSOR (WIRE) ×3 IMPLANT
MANIFOLD NEPTUNE II (INSTRUMENTS) ×3 IMPLANT
PACK CYSTO (CUSTOM PROCEDURE TRAY) ×3 IMPLANT
STENT URET 6FRX26 CONTOUR (STENTS) ×3 IMPLANT
TUBING CONNECTING 10 (TUBING) IMPLANT
TUBING CONNECTING 10' (TUBING)

## 2017-05-23 NOTE — Anesthesia Procedure Notes (Addendum)
Procedure Name: Intubation Date/Time: 05/23/2017 5:53 PM Performed by: West Pugh, CRNA Pre-anesthesia Checklist: Patient identified, Emergency Drugs available, Suction available, Patient being monitored and Timeout performed Patient Re-evaluated:Patient Re-evaluated prior to induction Oxygen Delivery Method: Circle system utilized Preoxygenation: Pre-oxygenation with 100% oxygen Induction Type: IV induction, Cricoid Pressure applied and Rapid sequence Laryngoscope Size: Mac and 4 Grade View: Grade II Tube type: Oral Tube size: 7.5 mm Number of attempts: 1 Airway Equipment and Method: Stylet Placement Confirmation: ETT inserted through vocal cords under direct vision,  positive ETCO2,  CO2 detector and breath sounds checked- equal and bilateral Secured at: 22 cm Tube secured with: Tape Dental Injury: Teeth and Oropharynx as per pre-operative assessment

## 2017-05-23 NOTE — Progress Notes (Signed)
Spoke with Dr. Smith Robert regarding patient unable to take Bystolic 5 mg for 2 nights due to nausea and vomiting.  Dr. Smith Robert states he will treat patient in OR if needed.  Also informed Dr. Smith Robert RN ordered chest x-ray per anesthesia protocol for new onset of shortness of breath, cough and fever.

## 2017-05-23 NOTE — Progress Notes (Signed)
Pharmacy Antibiotic Note  Sharon [name] is a 51 y.o. female admitted on 05/23/2017 with sepsis.  Pharmacy has been consulted for vancomycin dosing.  Patient is s/p cystoscopy with right retrograde urogram and insertion of right urethral stent.  She was given vancomycin 1g and gentamicin 5 mg/kg pre-op.  Levaquin and vancomycin per pharmacy ordered post-op.  Plan: Vancomycin 1250 mg IV q24h for goal AUC 400-500.  Height: 5\' 8"  (172.7 cm) Weight: 157 lb 6.4 oz (71.4 kg) IBW/kg (Calculated) : 63.9  Temp (24hrs), Avg:99.7 F (37.6 C), Min:98.7 F (37.1 C), Max:102.8 F (39.3 C)  Recent Labs  Lab 05/23/17 1350 05/23/17 1855  WBC 16.0*  --   CREATININE 1.00 0.89    Estimated Creatinine Clearance: 74.6 mL/min (by C-G formula based on SCr of 0.89 mg/dL).    Allergies  Allergen Reactions  . Dextromethorphan Polistirex Er     Pt states caused "a lump" in her throat, making it difficult to swallow  . Hydrocodone Nausea And Vomiting  . Oxycodone-Acetaminophen Other (See Comments)    Made fingers tingle and go numb, started "feeling weird".   . Penicillins     Unknown: as child      Thank you for allowing pharmacy to be a part of this patient's care.  Hershal Coria 05/23/2017 8:12 PM

## 2017-05-23 NOTE — H&P (Addendum)
History and Physical    Sharon [name] ZOX:[id number] DOB: 05-Dec-1965 DOA: 05/23/2017  PCP: Wardell Honour, MD Patient coming from: Home  Chief Complaint: Fevers  HPI: Sharon [name] is a 51 y.o. female with medical history significant of hypertension, generalized anxiety disorder, history of DVT not on anticoagulation, hypertension, GERD was electively scheduled by urology for cystoscopy/ureterogram with right stent placement today.  Patient states she has been having high grade fevers, diaphoresis and right-sided abdominal pain for the past 4 days.  During this time she has also had loss of nausea with 1 or 2 episodes and on bloody vomiting.  She went to go see her PCP on Monday and was told she might have urinary tract infection therefore she was given Cipro.  Patient did not like the way Cipro made her feel as she felt little nauseous.  She was also sent to urology given her previous history of stone, and was seen by Dr McDiarmid this morning.  Outpatient CT?? ( I dont see it in the EMR) per urology notes shows 4-5 mm stone in the right ureter with mild hydronephrosis.  And later today she was taken to the OR for stent placement.  Prior to her stent placement she was noted to be febrile with fever of 102 and diaphoretic therefore she was given vancomycin and gentamicin.  Postop medical team was consulted and requested to admit the patient for sepsis.  When I saw the patient in PACU her right-sided pain and nausea was better.  She still felt dehydrated and slightly diaphoretic/warm.  No other complaints at this time.   Review of Systems: As per HPI otherwise 10 point review of systems negative.   Past Medical History:  Diagnosis Date  . Allergy   . Anal fissure   . Anxiety   . Arthritis   . Blood clot in vein    left leg  . Detached retina   . DVT (deep venous thrombosis) (HCC)    left leg  . Endometriosis   . GERD (gastroesophageal reflux disease)   . Heart murmur   . History of  hemorrhoids   . Hypertension   . Pulmonary embolism [facility])     Past Surgical History:  Procedure Laterality Date  . ABDOMINAL HYSTERECTOMY    . CATARACT EXTRACTION Bilateral 2015  . ENDOMETRIAL ABLATION    . [place]  2011  . INCONTINENCE SURGERY    . INNER EAR SURGERY    . RECTAL PROLAPSE REPAIR    . RETINAL DETACHMENT SURGERY Bilateral 2000  . TUBAL LIGATION    . VEIN SURGERY Left 04/2010     reports that  has never smoked. she has never used smokeless tobacco. She reports that she drinks about 1.2 oz of alcohol per week. She reports that she does not use drugs.  Allergies  Allergen Reactions  . Dextromethorphan Polistirex Er     Pt states caused "a lump" in her throat, making it difficult to swallow  . Hydrocodone Nausea And Vomiting  . Oxycodone-Acetaminophen Other (See Comments)    Made fingers tingle and go numb, started "feeling weird".   . Penicillins     Unknown: as child    Family History  Problem Relation Age of Onset  . Emphysema Mother        smoker  . Allergies Mother   . Asthma Mother   . Heart disease Mother        AMI as cause of death  . COPD  Mother   . Asthma Father   . Heart disease Father        AMI as cause of death  . Diabetes Father   . Asthma Brother   . Heart disease Brother 5       heart failure  . Lung cancer Maternal Grandfather        was a smoker  . Cancer Maternal Grandfather        lung  . Heart disease Paternal Grandmother   . Heart disease Paternal Grandfather   . Colon cancer Neg Hx     Acceptable: Family history reviewed and not pertinent (If you reviewed it)  Prior to Admission medications   Medication Sig Start Date End Date Taking? Authorizing Provider  acetaminophen (TYLENOL) 500 MG tablet Take 500 mg by mouth 2 (two) times daily.   Yes [provider]  celecoxib (CELEBREX) 200 MG capsule Take 1 capsule (200 mg total) by mouth daily. 11/08/16  Yes Wardell Honour, MD  ciprofloxacin (CIPRO) 500 MG  tablet Take 500 mg by mouth 2 (two) times daily. Pt does not know mg of this medication   Yes [provider]  clonazePAM (KLONOPIN) 0.5 MG tablet TAKE 1 TABLET BY MOUTH AT BEDTIME AND A 1/2 TABLET DURING THE DAY AS NEEDED 11/08/16  Yes Wardell Honour, MD  ondansetron (ZOFRAN-ODT) 8 MG disintegrating tablet Take 1 tablet (8 mg total) by mouth every 8 (eight) hours as needed for nausea. 11/08/16  Yes Wardell Honour, MD  pantoprazole (PROTONIX) 40 MG tablet Take 1 tablet (40 mg total) by mouth daily. 11/08/16  Yes Wardell Honour, MD  primidone (MYSOLINE) 50 MG tablet TAKE 2 TABLETS (100 MG TOTAL) BY MOUTH AT BEDTIME. 11/08/16  Yes Wardell Honour, MD  promethazine (PHENERGAN) 12.5 MG tablet Take 12.5 mg by mouth every 6 (six) hours as needed for nausea or vomiting. Pt does not know mg   Yes [provider]  ranitidine (ZANTAC) 150 MG tablet TAKE 2 TABLETS IN THE MORNING AS NEEDED FOR REFLUX 02/17/17  Yes Wardell Honour, MD  Travoprost (TRAVATAN Z OP) Apply to eye daily.   Yes [provider]  acyclovir (ZOVIRAX) 400 MG tablet Take 1 tablet by mouth daily. 07/04/10   [provider]  Ascorbic Acid (VITAMIN C) 1000 MG tablet Take 1,000 mg by mouth daily.    [provider]  BYSTOLIC 5 MG tablet TAKE 1 TABLET BY MOUTH EVERY DAY 05/21/17   Wardell Honour, MD  Cholecalciferol (VITAMIN D) 2000 UNITS tablet Take 2,000 Units by mouth daily.    [provider]  diclofenac sodium (VOLTAREN) 1 % GEL Apply 2 g topically 4 (four) times daily. 08/21/16   Mcarthur Rossetti, MD  Magnesium 500 MG CAPS Take by mouth daily.    [provider]  Nitroglycerin (RECTIV) 0.4 % OINT Place 0.4 inches rectally 2 (two) times daily. Apply 1 in of Rectiv intra-anally x 3 weeks. 05/06/12   Stark Klein, MD  NON FORMULARY Reported on 12/24/2015    [provider]  Polyethylene Glycol 3350 (MIRALAX PO) Take 1 Dose by mouth daily.    [provider]    vitamin B-12 (CYANOCOBALAMIN) 1000 MCG tablet Take 1,000 mcg by mouth daily.    [provider]    Physical Exam: Vitals:   05/23/17 1523 05/23/17 1830 05/23/17 1845 05/23/17 1900  BP:  136/79 135/75 133/74  Pulse:  86 78 77  Resp:  16  12 18  Temp: (!) 102.8 F (39.3 C) 99.1 F (37.3 C)    TempSrc:      SpO2:  100% 100% 100%  Weight:      Height:          Constitutional: NAD, calm, comfortable Vitals:   05/23/17 1523 05/23/17 1830 05/23/17 1845 05/23/17 1900  BP:  136/79 135/75 133/74  Pulse:  86 78 77  Resp:  16 12 18   Temp: (!) 102.8 F (39.3 C) 99.1 F (37.3 C)    TempSrc:      SpO2:  100% 100% 100%  Weight:      Height:       Eyes: PERRL, lids and conjunctivae normal ENMT: Mucous membranes are moist. Posterior pharynx clear of any exudate or lesions.Normal dentition.  Neck: normal, supple, no masses, no thyromegaly Respiratory: clear to auscultation bilaterally, no wheezing, no crackles. Normal respiratory effort. No accessory muscle use.  Cardiovascular: Regular rate and rhythm, no murmurs / rubs / gallops. No extremity edema. 2+ pedal pulses. No carotid bruits.  Abdomen: Mild right-sided CVA tenderness, no masses palpated. No hepatosplenomegaly. Bowel sounds positive.  Musculoskeletal: no clubbing / cyanosis. No joint deformity upper and lower extremities. Good ROM, no contractures. Normal muscle tone.  Skin: no rashes, lesions, ulcers. No induration Neurologic: CN 2-12 grossly intact. Sensation intact, DTR normal. Strength 5/5 in all 4.  Psychiatric: Normal judgment and insight. Alert and oriented x 3. Normal mood.     Labs on Admission: I have personally reviewed following labs and imaging studies  CBC: Recent Labs  Lab 05/23/17 1350  WBC 16.0*  HGB 13.2  HCT 37.8  MCV 88.1  PLT 462   Basic Metabolic Panel: Recent Labs  Lab 05/23/17 1350  NA 136  K 3.2*  CL 95*  CO2 27  GLUCOSE 96  BUN 22*  CREATININE 1.00  CALCIUM 9.8    GFR: Estimated Creatinine Clearance: 66.4 mL/min (by C-G formula based on SCr of 1 mg/dL). Liver Function Tests: No results for input(s): AST, ALT, ALKPHOS, BILITOT, PROT, ALBUMIN in the last 168 hours. No results for input(s): LIPASE, AMYLASE in the last 168 hours. No results for input(s): AMMONIA in the last 168 hours. Coagulation Profile: No results for input(s): INR, PROTIME in the last 168 hours. Cardiac Enzymes: No results for input(s): CKTOTAL, CKMB, CKMBINDEX, TROPONINI in the last 168 hours. BNP (last 3 results) No results for input(s): PROBNP in the last 8760 hours. HbA1C: No results for input(s): HGBA1C in the last 72 hours. CBG: No results for input(s): GLUCAP in the last 168 hours. Lipid Profile: No results for input(s): CHOL, HDL, LDLCALC, TRIG, CHOLHDL, LDLDIRECT in the last 72 hours. Thyroid Function Tests: No results for input(s): TSH, T4TOTAL, FREET4, T3FREE, THYROIDAB in the last 72 hours. Anemia Panel: No results for input(s): VITAMINB12, FOLATE, FERRITIN, TIBC, IRON, RETICCTPCT in the last 72 hours. Urine analysis:    Component Value Date/Time   BILIRUBINUR negative 10/21/2015 1922   BILIRUBINUR neg 05/09/2013 1021   KETONESUR negative 10/21/2015 1922   PROTEINUR negative 10/21/2015 1922   PROTEINUR neg 05/09/2013 1021   UROBILINOGEN 0.2 10/21/2015 1922   NITRITE Negative 10/21/2015 1922   NITRITE neg 05/09/2013 1021   LEUKOCYTESUR Negative 10/21/2015 1922   Sepsis Labs: !!!!!!!!!!!!!!!!!!!!!!!!!!!!!!!!!!!!!!!!!!!! @LABRCNTIP (procalcitonin:4,lacticidven:4) )No results found for this or any previous visit (from the past 240 hour(s)).   Radiological Exams on Admission: Dg Chest 2 View  Result Date: 05/23/2017 CLINICAL DATA:  Preop, fever, dyspnea. EXAM: CHEST  2 VIEW COMPARISON:  10/21/2015 FINDINGS: The heart size and mediastinal contours are within normal limits. Both lungs are clear. The visualized skeletal structures are nonacute. IMPRESSION: No  active cardiopulmonary disease. Electronically Signed   By: Ashley Royalty M.D.   On: 05/23/2017 14:21   Dg C-arm 1-60 Min-no Report  Result Date: 05/23/2017 Fluoroscopy was utilized by the requesting physician.  No radiographic interpretation.    EKG: Not done   Assessment/Plan Active Problems:   Ureteral stone   Sepsis ([place])   Sepsis secondary to urinary tract infection Infected renal stone with hydronephrosis -Patient is status post cystoscopy with right retrograde urogram and insertion of right urethral stent POD #0.  Urology to follow along -Will admit her inpatient for further IV antibiotic treatment given high fevers and concerns for bacteremia - Unfortunately she was given dose of vancomycin and gentamicin prior to blood cultures, will still get BCx and UA/Ucx -Vancomycin and gentamicin given preoperatively, will order IV Levaquin -IV fluids, monitor urine output -Pain control, antiemetics - Diet as tolerated -We will check her routine labs at this time as well  Essential hypertension -Continue home medication Bystolic 5 mg daily  Generalized anxiety disorder -Klonopin ordered  Bilateral lower back pain without sciatica -Pain control  History of recurrent herpes - She is on acyclovir 400 mg daily  GERD -Pepcid   DVT prophylaxis: SCDs Code Status: Full Family Communication: None at bedside.  Disposition Plan: TBD Consults called: Urology on board.  Admission status: Inpatient tele    Ankit Arsenio Loader MD [facility]   If 7PM-7AM, please contact night-coverage [email or web address] Password [email or web address]  05/23/2017, 7:18 PM

## 2017-05-23 NOTE — H&P (Signed)
I was consult up by the above provider to assess the patient's microscopic hematuria. Sometimes she sees a little pink on a light pad she thinks is from the urine and not from the vagina. She had a sling 5 years ago. She does not get bladder infections and has never had a kidney stone   Since her surgery her flow was slower and its angle to the right side. After elective surgery she may need to self catheterize for a number of days   She now voids every 2-3 hours and gets up once or twice a night and is continent. She has never smoked and does not take daily aspirin or blood thinners.   the patient will return with a CT scan. She does have slowing of her stream but should not have loosening of her sling. She had no obvious distortion of the urethral axis on cystoscopy. I will send a note to the above provider; cysto normal in August 2017   Today  The patient had a 3 mm nonobstructing stone in the right kidney last year. Over the weekend she had non specific significant pain underneath the right costal margin and posteriorly in the right flank. Two days ago her temperature was a 104.3. She is on ciprofloxacin. She is not certain if it causes a bit of nausea. Her history was nonspecific and a bit vague but she still says she aches. She had no foul-smelling urine or frequency or burning. She had no vomiting.   On physical examination she was nontoxic. She may have minimal tenderness in the right flank   There is no other aggravating or relieving factors  There is no other associated signs and symptoms  The severity of the symptoms is moderate  The symptoms are ongoing and bothersome   Because of her fever and CT scan results last year I thought it was best to order a CT scan stone protocol and send her urine for culture   I reviewed the CT scan and in my opinion she has a 4 5 mm stone in the upper right ureter with mild hydronephrosis. The stone that was present in the kidney is no longer present.    Picture was drawn. The more I spoke to the patient's it appears that she has been febrile also yesterday unless she takes Tylenol. She did take it this morning. She has been overall feeling poorly. She has not been able the eat well.   She understands the concept of an infected stone and the semi urgent basis for a stent. Pros cons and risks and sequelae discussed. I think would be best to place her on IV antibiotics and at least watch her overnight to make certain she does not get fevers through the night. She is younger and looks non toxic today on ciprofloxacin and lives locally.     ALLERGIES: dextromethorphan Hydrocodone - Vomiting Penicillin - lump in throat    MEDICATIONS: Cipro 500 mg tablet  Acyclovir  Ascorbic Acid  Bystolic  Celecoxib  Cholecalciferol  Clonazepam  Ketoconazole 2 % cream  Magnesium 500 mg capsule  Ondansetron Hcl  Oxycodone-Acetaminophen 5 mg-325 mg tablet  Pantoprazole Sodium  Primidone  Ranitidine Hcl  Vitamin B12     GU PSH: Cystoscopy - 02/04/2016 Locm 300-399Mg /Ml Iodine,1Ml - 02/18/2016      PSH Notes: Bladder sling  detached retinas bilateral  cataract lens implants  right ear drum repair  hysterectomy  hemorrhoid repair  rectal mesh implant  NON-GU PSH: None   GU PMH: Microscopic hematuria - 02/04/2016 Nocturia - 02/04/2016      PMH Notes: Blood clot in leg/DVT  Blood clot in lung/PE  Heart Murmur     NON-GU PMH: Anxiety Arthritis Glaucoma Heartburn Personal history of other diseases of the circulatory system    FAMILY HISTORY: 1 Daughter - Other Blood In Urine - Mother COPD (chronic obstructive pulmonary disease) with - Mother Diabetes - Father father deceased at age 49 brain aneurism - Other fibromyalgia - Mother Heart abnormality - Brother Heart Disease - Father Kidney Stones - Father mother deceased at age 41 - Other Osteoporosis - Mother scoliosis - Mother    Notes: mother died from heard attack and COPD   Father died from diabetes and heart disease   SOCIAL HISTORY: Marital Status: Single Current Smoking Status: Patient has never smoked.  Has never drank.  Drinks 2 caffeinated drinks per day. 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    REVIEW OF SYSTEMS:    GU Review Female:   Patient reports hard to postpone urination and get up at night to urinate. Patient denies frequent urination, burning /pain with urination, leakage of urine, stream starts and stops, trouble starting your stream, have to strain to urinate, and being pregnant.  Gastrointestinal (Upper):   Patient reports nausea and vomiting. Patient denies indigestion/ heartburn.  Gastrointestinal (Lower):   Patient denies diarrhea and constipation.  Constitutional:   Patient reports fever, night sweats, weight loss, and fatigue.   Skin:   Patient denies skin rash/ lesion and itching.  Eyes:   Patient denies blurred vision and double vision.  Ears/ Nose/ Throat:   Patient denies sore throat and sinus problems.  Hematologic/Lymphatic:   Patient reports easy bruising. Patient denies swollen glands.  Cardiovascular:   Patient denies leg swelling and chest pains.  Respiratory:   Patient reports cough and shortness of breath.   Endocrine:   Patient reports excessive thirst.   Musculoskeletal:   Patient reports back pain and joint pain.   Neurological:   Patient reports headaches. Patient denies dizziness.  Psychologic:   Patient denies depression and anxiety.   VITAL SIGNS:      05/23/2017 08:49 AM  BP 91/57 mmHg  Pulse 90 /min  Temperature 98.3 F / 36.8 C   PAST DATA REVIEWED:  Source Of History:  Patient   PROCEDURES:         C.T. Urogram - [id number]               Urinalysis w/Scope Dipstick Dipstick Cont'd Micro  Color: Yellow Bilirubin: Neg WBC/hpf: 10 - 20/hpf  Appearance: Clear Ketones: 1+ RBC/hpf: 3 - 10/hpf  Specific Gravity: 1.020 Blood: 3+ Bacteria: Rare (0-9/hpf)  pH: 6.0 Protein: Trace Cystals: NS (Not Seen)   Glucose: Neg Urobilinogen: 0.2 Casts: Hyaline    Nitrites: Neg Trichomonas: Not Present    Leukocyte Esterase: Neg Mucous: Not Present      Epithelial Cells: 0 - 5/hpf      Yeast: NS (Not Seen)      Sperm: Not Present    ASSESSMENT:      ICD-10 Details  1 GU:   Ureteral calculus - N20.1   2   Acute Cystitis/UTI - N30.00      PLAN:           Orders Labs Urine Culture  X-Rays: C.T. Stone Protocol Without Contrast  After a thorough review of the management options for the patient's condition the patient  elected to  proceed with surgical therapy as noted above. We have discussed the potential benefits and risks of the procedure, side effects of the proposed treatment, the likelihood of the patient achieving the goals of the procedure, and any potential problems that might occur during the procedure or recuperation. Informed consent has been obtained.

## 2017-05-23 NOTE — Anesthesia Preprocedure Evaluation (Addendum)
Anesthesia Evaluation  Patient identified by MRN, date of birth, ID band Patient awake    Reviewed: Allergy & Precautions, NPO status , Patient's Chart, lab work & pertinent test results  Airway Mallampati: I  TM Distance: >3 FB Neck ROM: Full    Dental  (+) Teeth Intact, Dental Advisory Given   Pulmonary neg pulmonary ROS,    breath sounds clear to auscultation       Cardiovascular hypertension, + Valvular Problems/Murmurs  Rhythm:Regular Rate:Normal     Neuro/Psych PSYCHIATRIC DISORDERS Anxiety negative neurological ROS     GI/Hepatic Neg liver ROS, GERD  Medicated,  Endo/Other  negative endocrine ROS  Renal/GU negative Renal ROS     Musculoskeletal  (+) Arthritis ,   Abdominal   Peds  Hematology negative hematology ROS (+)   Anesthesia Other Findings Day of surgery medications reviewed with the patient.  Reproductive/Obstetrics                            Anesthesia Physical Anesthesia Plan  ASA: II  Anesthesia Plan: General   Post-op Pain Management:    Induction: Intravenous, Rapid sequence and Cricoid pressure planned  PONV Risk Score and Plan: 4 or greater and Ondansetron, Dexamethasone and Midazolam  Airway Management Planned: Oral ETT  Additional Equipment:   Intra-op Plan:   Post-operative Plan: Extubation in OR  Informed Consent: I have reviewed the patients History and Physical, chart, labs and discussed the procedure including the risks, benefits and alternatives for the proposed anesthesia with the patient or authorized representative who has indicated his/her understanding and acceptance.   Dental advisory given  Plan Discussed with: CRNA  Anesthesia Plan Comments:        Anesthesia Quick Evaluation

## 2017-05-23 NOTE — Anesthesia Postprocedure Evaluation (Signed)
Anesthesia Post Note  Patient: Sharon [name]  Procedure(s) Performed: CYSTOSCOPY WITH RETROGRADE PYELOGRAM/URETERAL RIGHT STENT PLACEMENT (Right )     Patient location during evaluation: PACU Anesthesia Type: General Level of consciousness: awake and alert Pain management: pain level controlled Vital Signs Assessment: post-procedure vital signs reviewed and stable Respiratory status: spontaneous breathing, nonlabored ventilation, respiratory function stable and patient connected to nasal cannula oxygen Cardiovascular status: blood pressure returned to baseline and stable Postop Assessment: no apparent nausea or vomiting Anesthetic complications: no    Last Vitals:  Vitals:   05/23/17 1915 05/23/17 1943  BP: 133/78 (!) 143/69  Pulse: 82 79  Resp: 15 16  Temp: 37.1 C 37.2 C  SpO2: 99% 100%    Last Pain:  Vitals:   05/23/17 1900  TempSrc:   PainSc: 0-No pain                 Effie Berkshire

## 2017-05-23 NOTE — Progress Notes (Signed)
Spoke with Dr. Smith Robert regarding potential need for CXR.  He gave order for CXR if any adventitious sounds on auscultation, if not, no need for CRX.

## 2017-05-23 NOTE — Interval H&P Note (Signed)
History and Physical Interval Note:  05/23/2017 5:40 PM  Sharon [name]  has presented today for surgery, with the diagnosis of RIGHT STONE  The various methods of treatment have been discussed with the patient and family. After consideration of risks, benefits and other options for treatment, the patient has consented to  Procedure(s): CYSTOSCOPY WITH RETROGRADE PYELOGRAM/URETERAL RIGHT STENT PLACEMENT (Right) as a surgical intervention .  The patient's history has been reviewed, patient examined, no change in status, stable for surgery.  I have reviewed the patient's chart and labs.  Questions were answered to the patient's satisfaction.     Pedro Whiters A

## 2017-05-23 NOTE — Op Note (Signed)
Preoperative diagnosis: Right ureteral stone and fever Postoperative diagnosis: Right ureteral stone and fever Surgery: Cystoscopy and right retrograde ureterogram and insertion of right ureteral stent Surgeon: Dr. Nicki Reaper Garron Eline  The patient has the above diagnosis and consented to the above procedure.  When I first saw the patient in the office she was nontoxic.  I was going to stand her and keep her in overnight.  Throughout the day in my opinion she developed a fever of approximately 102.  She did not look toxic.  I started the patient on a septic protocol.  Unfortunately blood cultures had not been performed prior to surgery  The patient was given IV gentamicin and vancomycin.  Urine had been sent for culture  The patient was prepped and draped in the usual fashion.  Extra care was taken with leg positioning to minimize the risk of compartment syndrome and neuropathy and deep vein thrombosis.  A 24 French cystoscope was utilized.  Bladder mucosa and trigone were normal.  There is no evidence of cystitis  Under fluoroscopic and cystoscopic guidance I passed a sensor wire to the mid right ureter.  I then passed an open-ended ureteral catheter to this level removing the wire.  I did a gentle retrograde ureterogram with 4 cc of contrast.  There was a filling defect at the right ureteropelvic junction or just distal.  I could see mild hydroureter.  I did not over distend  I passed a sensor wire all the way currently in the upper pole calyx.  I removed the open-ended 6 French ureteral catheter.  A well-prepared 26 cm by 6 French double-J stent was placed over the wire curling in the renal pelvis and curling in the bladder.  The sensor wire was removed.  There was a mild volcano effect.  Bladder was empty.  Patient was taken the recovery.  The patient and I spoke prior to surgery and she will need to be kept in for at least an extra day hoping that urine cultures will come back prior.  I will speak to  internal medicine postoperatively and try to transfer her care to them for sepsis protocol

## 2017-05-23 NOTE — Transfer of Care (Signed)
Immediate Anesthesia Transfer of Care Note  Patient: Sharon [name]  Procedure(s) Performed: CYSTOSCOPY WITH RETROGRADE PYELOGRAM/URETERAL RIGHT STENT PLACEMENT (Right )  Patient Location: PACU  Anesthesia Type:General  Level of Consciousness: awake, alert , oriented and patient cooperative  Airway & Oxygen Therapy: Patient Spontanous Breathing and Patient connected to face mask oxygen  Post-op Assessment: Report given to RN, Post -op Vital signs reviewed and stable and Patient moving all extremities X 4  Post vital signs: Reviewed and stable  Last Vitals:  Vitals:   05/23/17 1316 05/23/17 1523  BP: 114/75   Pulse: 81   Resp: 18   Temp: 37.3 C (!) 39.3 C  SpO2: 100%     Last Pain:  Vitals:   05/23/17 1316  TempSrc: Oral  PainSc: 5       Patients Stated Pain Goal: 4 (21/11/73 5670)  Complications: No apparent anesthesia complications

## 2017-05-24 ENCOUNTER — Encounter (HOSPITAL_COMMUNITY): Payer: Self-pay | Admitting: Urology

## 2017-05-24 DIAGNOSIS — N201 Calculus of ureter: Secondary | ICD-10-CM

## 2017-05-24 DIAGNOSIS — A021 Salmonella sepsis: Secondary | ICD-10-CM

## 2017-05-24 DIAGNOSIS — R0602 Shortness of breath: Secondary | ICD-10-CM

## 2017-05-24 LAB — COMPREHENSIVE METABOLIC PANEL
ALBUMIN: 3.4 g/dL — AB (ref 3.5–5.0)
ALK PHOS: 59 U/L (ref 38–126)
ALT: 16 U/L (ref 14–54)
AST: 16 U/L (ref 15–41)
Anion gap: 9 (ref 5–15)
BUN: 21 mg/dL — ABNORMAL HIGH (ref 6–20)
CALCIUM: 8.9 mg/dL (ref 8.9–10.3)
CO2: 25 mmol/L (ref 22–32)
CREATININE: 0.7 mg/dL (ref 0.44–1.00)
Chloride: 104 mmol/L (ref 101–111)
GFR calc non Af Amer: >60 mL/min (ref >60)
GLUCOSE: 212 mg/dL — AB (ref 65–99)
Potassium: 3.5 mmol/L (ref 3.5–5.1)
SODIUM: 138 mmol/L (ref 135–145)
Total Bilirubin: 0.6 mg/dL (ref 0.3–1.2)
Total Protein: 7.2 g/dL (ref 6.5–8.1)

## 2017-05-24 MED ORDER — CEFDINIR 125 MG/5ML PO SUSR: 250.0000 mg | Freq: Two times a day (BID) | ORAL | Status: DC

## 2017-05-24 MED ORDER — CEFPODOXIME PROXETIL 200 MG PO TABS: 200.0000 mg | ORAL_TABLET | Freq: Two times a day (BID) | ORAL | 0 refills | Status: AC

## 2017-05-24 MED ORDER — CEFPODOXIME PROXETIL 200 MG PO TABS
200.0000 mg | ORAL_TABLET | Freq: Two times a day (BID) | ORAL | Status: DC
  Administered 2017-05-24 – 2017-05-25 (×3): 200 mg via ORAL
  Filled 2017-05-24 (×3): qty 1

## 2017-05-24 MED ORDER — ENOXAPARIN SODIUM 40 MG/0.4ML ~~LOC~~ SOLN
40.0000 mg | SUBCUTANEOUS | Status: DC
  Filled 2017-05-24: qty 0.4

## 2017-05-24 NOTE — Progress Notes (Signed)
At the beginning of shift patient complained of pain and stated they had percocet ordered for her and I informed her that she actually had norco ordered and explained what was in that medication. Patient agreed to take medication. Patient took a second dose of norco (see emar when administered). When I went to check on her this evening stated her stomach was hurting and we had the discussion about norco again. I told her to hold off on norco and try Tylenol to see if the norco is the cause of her stomach pain.

## 2017-05-24 NOTE — Progress Notes (Addendum)
[facility] PROGRESS NOTE  Sharon [name] HEN:[id number] DOB: 10-03-1965 DOA: 05/23/2017   PCP: Wardell Honour, MD     Assessment/Plan: Active Problems:   Ureteral stone   Sepsis [facility])  51 y.o. female with medical history significant of hypertension, generalized anxiety disorder, history of DVT not on anticoagulation, hypertension, GERD was electively scheduled by urology for cystoscopy/ureterogram with right stent placement today.  Patient states she has been having high grade fevers, diaphoresis and right-sided abdominal pain for the past 4 days.  During this time she has also had loss of nausea with 1 or 2 episodes and on bloody vomiting. Outpatient CT?? ( I dont see it in the EMR) per urology notes shows 4-5 mm stone in the right ureter with mild hydronephrosis.   she was taken to the OR for stent placement 12/ 5 .  Prior to her stent placement she was noted to be febrile with fever of 102 and diaphoretic therefore she was given vancomycin and gentamicin    Assessment/Plan Active Problems:   Ureteral stone   Sepsis ([place])   Sepsis secondary to urinary tract infection Infected renal stone with hydronephrosis -Patient is status post cystoscopy with right retrograde urogram and insertion of right urethral stent POD #1 .  Urology to follow along -Was on  IV antibiotic treatment given high fevers and concerns for bacteremia - given vancomycin and gentamicin prior to blood cultures, will still get BCx and UA/Ucx, NGSF -Vancomycin and gentamicin given preoperatively, changed IV Levaquin to oral Vantin, no sign of allergic reaction  -IV fluids, monitor urine output -Pain control, antiemetics - Diet as tolerated  will watch for another 24 hrs as recommended to her by urology   Essential hypertension -Continue home medication Bystolic 5 mg daily  Generalized anxiety disorder -Klonopin ordered  Bilateral lower back pain without sciatica -Pain control  History of  recurrent herpes - She is on acyclovir 400 mg daily  GERD -Pepcid   DVT prophylaxsis lovenox   Code Status:   Full code    Family Communication: Discussed in detail with the patient, all imaging results, lab results explained to the patient   Disposition Plan:   Dc in am       Consultants:  urology  Procedures: Right ureteral stone and fever Surgery: Cystoscopy and right retrograde ureterogram and insertion of right ureteral stent      Antibiotics: Anti-infectives (From admission, onward)   Start     Dose/Rate Route Frequency Ordered Stop   05/24/17 1030  cefpodoxime (VANTIN) tablet 200 mg     200 mg Oral Every 12 hours 05/24/17 1015     05/24/17 1000  cefdinir (OMNICEF) 125 MG/5ML suspension 250 mg  Status:  Discontinued     250 mg Oral 2 times daily 05/24/17 0955 05/24/17 1015   05/24/17 0600  vancomycin (VANCOCIN) 1,250 mg in sodium chloride 0.9 % 250 mL IVPB     1,250 mg 166.7 mL/hr over 90 Minutes Intravenous Every 24 hours 05/23/17 2024     05/24/17 0000  cefpodoxime (VANTIN) 200 MG tablet     200 mg Oral 2 times daily 05/24/17 1143 06/03/17 2359   05/23/17 2200  acyclovir (ZOVIRAX) tablet 400 mg     400 mg Oral Daily 05/23/17 1954     05/23/17 2200  levofloxacin (LEVAQUIN) IVPB 750 mg  Status:  Discontinued     750 mg 100 mL/hr over 90 Minutes Intravenous Every 24 hours 05/23/17 1954 05/24/17  1751   05/23/17 1815  vancomycin (VANCOCIN) IVPB 1000 mg/200 mL premix     1,000 mg 200 mL/hr over 60 Minutes Intravenous  Once 05/23/17 1805 05/23/17 1914   05/23/17 1808  vancomycin (VANCOCIN) 1-5 GM/200ML-% IVPB    Comments:  Zigmund Daniel   : cabinet override      05/23/17 1808 05/23/17 1814   05/23/17 1314  gentamicin (GARAMYCIN) 360 mg in dextrose 5 % 100 mL IVPB     5 mg/kg  71.4 kg 109 mL/hr over 60 Minutes Intravenous 30 min pre-op 05/23/17 1314 05/24/17 1007         HPI/Subjective: Feels well, wants to eat and has been afebrile    Objective: Vitals:   05/23/17 1915 05/23/17 1943 05/24/17 0435 05/24/17 1338  BP: 133/78 (!) 143/69 138/83 129/79  Pulse: 82 79 71 68  Resp: 15 16 16    Temp: 98.7 F (37.1 C) 99 F (37.2 C) 98.6 F (37 C) 98.9 F (37.2 C)  TempSrc:   Oral Oral  SpO2: 99% 100% 100% 98%  Weight:      Height:        Intake/Output Summary (Last 24 hours) at 05/24/2017 1352 Last data filed at 05/24/2017 0600 Gross per 24 hour  Intake 2425 ml  Output 900 ml  Net 1525 ml    Exam:  Examination:  General exam: Appears calm and comfortable  Respiratory system: Clear to auscultation. Respiratory effort normal. Cardiovascular system: S1 & S2 heard, RRR. No JVD, murmurs, rubs, gallops or clicks. No pedal edema. Gastrointestinal system: Abdomen is nondistended, soft and nontender. No organomegaly or masses felt. Normal bowel sounds heard. Central nervous system: Alert and oriented. No focal neurological deficits. Extremities: Symmetric 5 x 5 power. Skin: No rashes, lesions or ulcers Psychiatry: Judgement and insight appear normal. Mood & affect appropriate.     Data Reviewed: I have personally reviewed following labs and imaging studies  Micro Results Recent Results (from the past 240 hour(s))  Culture, blood (routine x 2)     Status: None (Preliminary result)   Collection Time: 05/23/17  6:55 PM  Result Value Ref Range Status   Specimen Description BLOOD RIGHT ANTECUBITAL  Final   Special Requests   Final    BOTTLES DRAWN AEROBIC ONLY Blood Culture adequate volume   Culture   Final    NO GROWTH < 12 HOURS Performed at [facility], [address] [place], [place] [address]    Report Status PENDING  Incomplete  Culture, blood (routine x 2)     Status: None (Preliminary result)   Collection Time: 05/23/17  6:59 PM  Result Value Ref Range Status   Specimen Description BLOOD RIGHT ARM  Final   Special Requests   Final    BOTTLES DRAWN AEROBIC AND ANAEROBIC Blood Culture adequate  volume   Culture   Final    NO GROWTH < 12 HOURS Performed at [facility], [address], [place], [place] [address]    Report Status PENDING  Incomplete    Radiology Reports Dg Chest 2 View  Result Date: 05/23/2017 CLINICAL DATA:  Preop, fever, dyspnea. EXAM: CHEST  2 VIEW COMPARISON:  10/21/2015 FINDINGS: The heart size and mediastinal contours are within normal limits. Both lungs are clear. The visualized skeletal structures are nonacute. IMPRESSION: No active cardiopulmonary disease. Electronically Signed   By: Ashley Royalty M.D.   On: 05/23/2017 14:21   Dg C-arm 1-60 Min-no Report  Result Date: 05/23/2017  Fluoroscopy was utilized by the requesting physician.  No radiographic interpretation.     CBC Recent Labs  Lab 05/23/17 1350  WBC 16.0*  HGB 13.2  HCT 37.8  PLT 223  MCV 88.1  MCH 30.8  MCHC 34.9  RDW 13.1    Chemistries  Recent Labs  Lab 05/23/17 1350 05/23/17 1855 05/24/17 0416  NA 136 132* 138  K 3.2* 3.4* 3.5  CL 95* 95* 104  CO2 27 26 25   GLUCOSE 96 153* 212*  BUN 22* 21* 21*  CREATININE 1.00 0.89 0.70  CALCIUM 9.8 8.8* 8.9  MG  --  2.0  --   AST  --  15 16  ALT  --  13* 16  ALKPHOS  --  56 59  BILITOT  --  1.0 0.6   ------------------------------------------------------------------------------------------------------------------ estimated creatinine clearance is 83 mL/min (by C-G formula based on SCr of 0.7 mg/dL). ------------------------------------------------------------------------------------------------------------------ No results for input(s): HGBA1C in the last 72 hours. ------------------------------------------------------------------------------------------------------------------ No results for input(s): CHOL, HDL, LDLCALC, TRIG, CHOLHDL, LDLDIRECT in the last 72 hours. ------------------------------------------------------------------------------------------------------------------ No results for input(s): TSH, T4TOTAL,  T3FREE, THYROIDAB in the last 72 hours.  Invalid input(s): FREET3 ------------------------------------------------------------------------------------------------------------------ No results for input(s): VITAMINB12, FOLATE, FERRITIN, TIBC, IRON, RETICCTPCT in the last 72 hours.  Coagulation profile No results for input(s): INR, PROTIME in the last 168 hours.  No results for input(s): DDIMER in the last 72 hours.  Cardiac Enzymes No results for input(s): CKMB, TROPONINI, MYOGLOBIN in the last 168 hours.  Invalid input(s): CK ------------------------------------------------------------------------------------------------------------------ Invalid input(s): POCBNP   CBG: No results for input(s): GLUCAP in the last 168 hours.     Studies: Dg Chest 2 View  Result Date: 05/23/2017 CLINICAL DATA:  Preop, fever, dyspnea. EXAM: CHEST  2 VIEW COMPARISON:  10/21/2015 FINDINGS: The heart size and mediastinal contours are within normal limits. Both lungs are clear. The visualized skeletal structures are nonacute. IMPRESSION: No active cardiopulmonary disease. Electronically Signed   By: Ashley Royalty M.D.   On: 05/23/2017 14:21   Dg C-arm 1-60 Min-no Report  Result Date: 05/23/2017 Fluoroscopy was utilized by the requesting physician.  No radiographic interpretation.      Lab Results  Component Value Date   HGBA1C 5.1 11/08/2016   HGBA1C 5.3 05/30/2014   HGBA1C 5.1 05/09/2013   Lab Results  Component Value Date   LDLCALC 73 11/08/2016   CREATININE 0.70 05/24/2017       Scheduled Meds: . acyclovir  400 mg Oral Daily  . cefpodoxime  200 mg Oral Q12H  . cholecalciferol  2,000 Units Oral Daily  . famotidine  20 mg Oral Daily  . nebivolol  5 mg Oral Daily  . pantoprazole  40 mg Oral Daily  . primidone  100 mg Oral QHS  . vitamin B-12  1,000 mcg Oral Daily  . vitamin C  1,000 mg Oral Daily   Continuous Infusions: . sodium chloride 125 mL/hr at 05/24/17 0502  . vancomycin  Stopped (05/24/17 0800)     LOS: 1 day    Time spent: >30 MINS    Reyne Dumas  [facility] Pager [phone]. If 7PM-7AM, please contact night-coverage at [email or web address], password [facility] 05/24/2017, 1:52 PM  LOS: 1 day

## 2017-05-24 NOTE — Progress Notes (Signed)
Looks good  Vitals normal No pain but now has some burning with urination GREATLY appreciate medical team taking over care Recommend treat UTI and send home with appropriate po antibiotic I will follow in and afterwards

## 2017-05-25 LAB — URINE CULTURE
Culture: NO GROWTH
Culture: NO GROWTH

## 2017-05-25 MED ORDER — HYDROCODONE-ACETAMINOPHEN 5-325 MG PO TABS: 1.0000 | ORAL_TABLET | ORAL | 0 refills | Status: AC | PRN

## 2017-05-25 NOTE — Discharge Summary (Signed)
Physician Discharge Summary  Sharon [name] JSH:[id number] DOB: [birth year] DOA: 05/23/2017  PCP: Wardell Honour, MD  Admit date: 05/23/2017 Discharge date: 05/25/2017  Time spent: >35 minutes  Recommendations for Outpatient Follow-up:  Urology next week  PCP in 1-2 weeks as needed  Discharge Diagnoses:  Active Problems:   Ureteral stone   Sepsis [name])   Discharge Condition: stable   Diet recommendation: regular   Filed Weights   05/23/17 1316  Weight: 71.4 kg (157 lb 6.4 oz)    History of present illness:   51 y.o.femalewith medical history significant ofhypertension, generalized anxiety disorder, 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 Patient states she has been having high grade fevers, diaphoresis and right-sided abdominal pain for the past 4 days. During this time she has also had loss of nausea with 1 or 2 episodes and on bloody vomiting. Outpatient CT per urology notes shows 4-5 mm stone in the right ureter with mild hydronephrosis.   she was taken to the OR for stent placement 12/ 5 . Prior to her stent placement she was noted to be febrile with fever of 102 and diaphoretic therefore she was given vancomycin and gentamicin   Hospital Course:   Sepsis secondary to urinary tract infection. Infected renal stone with hydronephrosis Patient is status post cystoscopy with right retrograde urogram and insertion of right urethral stent 12/5 .recieved  IV antibiotic treatment given high fevers. Blood cultures: NGTD. Revers->resolved. Transitioned  to oral Vantin, tolerating well,  no sign of allergic reaction or systemic infection  -sepsis->resolved. d/w patient, recommended to f/u with urology next week.   Essential hypertension. home medicationBystolic5 mg daily. Stable   Generalized anxiety disorder Klonopin   History of recurrent  herpes. She is on acyclovir 400 mg daily  GERD. pepcid  D/w patient, reviewed [place] substance abuse data base. She reports taking percocet prior to admission. Does not have any more at home. Requested norco for few days for pain control. Tolerating well    Procedures:  right urethral stent   (i.e. Studies not automatically included, echos, thoracentesis, etc; not x-rays)  Consultations:  Urology   Discharge Exam: Vitals:   05/24/17 2101 05/25/17 0526  BP: 120/72 113/72  Pulse: 76 69  Resp: 16 16  Temp: 98.1 F (36.7 C) 98 F (36.7 C)  SpO2: 99% 100%    General: alert. No distress  Cardiovascular: s1,s2 rrr Respiratory: CTA BL  Discharge Instructions  Discharge Instructions    Diet - low sodium heart healthy   Complete by:  As directed    Discharge instructions   Complete by:  As directed    Please follow up with urology next week   Increase activity slowly   Complete by:  As directed      Allergies as of 05/25/2017      Reactions   Dextromethorphan Polistirex Er    Pt states caused "a lump" in her throat, making it difficult to swallow   Hydrocodone Nausea And Vomiting   Oxycodone-acetaminophen Other (See Comments)   Made fingers tingle and go numb, started "feeling weird".    Penicillins    Unknown: as child      Medication List    STOP taking these medications   ciprofloxacin 500 MG tablet Commonly known as:  CIPRO     TAKE these medications   acetaminophen 500 MG tablet Commonly known as:  TYLENOL Take  500 mg by mouth 2 (two) times daily.   acyclovir 400 MG tablet Commonly known as:  ZOVIRAX Take 400 mg by mouth daily.   BYSTOLIC 5 MG tablet Generic drug:  nebivolol TAKE 1 TABLET BY MOUTH EVERY DAY   cefpodoxime 200 MG tablet Commonly known as:  VANTIN Take 1 tablet (200 mg total) by mouth 2 (two) times daily for 10 days.   celecoxib 200 MG capsule Commonly known as:  CELEBREX Take 1 capsule (200 mg total) by mouth  daily.   clonazePAM 0.5 MG tablet Commonly known as:  KLONOPIN TAKE 1 TABLET BY MOUTH AT BEDTIME AND A 1/2 TABLET DURING THE DAY AS NEEDED   diclofenac sodium 1 % Gel Commonly known as:  VOLTAREN Apply 2 g topically 4 (four) times daily.   HYDROcodone-acetaminophen 5-325 MG tablet Commonly known as:  NORCO/VICODIN Take 1-2 tablets by mouth every 4 (four) hours as needed for up to 3 days for moderate pain or severe pain.   Magnesium 500 MG Caps Take 500 mg by mouth daily.   MIRALAX PO Take 17 g by mouth daily.   Nitroglycerin 0.4 % Oint Commonly known as:  RECTIV Place 0.4 inches rectally 2 (two) times daily. Apply 1 in of Rectiv intra-anally x 3 weeks.   ondansetron 8 MG disintegrating tablet Commonly known as:  ZOFRAN-ODT Take 1 tablet (8 mg total) by mouth every 8 (eight) hours as needed for nausea.   pantoprazole 40 MG tablet Commonly known as:  PROTONIX Take 1 tablet (40 mg total) by mouth daily.   primidone 50 MG tablet Commonly known as:  MYSOLINE TAKE 2 TABLETS (100 MG TOTAL) BY MOUTH AT BEDTIME.   promethazine 12.5 MG tablet Commonly known as:  PHENERGAN Take 12.5 mg by mouth every 6 (six) hours as needed for nausea or vomiting. Pt does not know mg   ranitidine 150 MG tablet Commonly known as:  ZANTAC TAKE 2 TABLETS IN THE MORNING AS NEEDED FOR REFLUX   TRAVATAN Z 0.004 % Soln ophthalmic solution Generic drug:  Travoprost (BAK Free) Place 1 drop into both eyes daily.   vitamin B-12 1000 MCG tablet Commonly known as:  CYANOCOBALAMIN Take 1,000 mcg by mouth daily.   vitamin C 1000 MG tablet Take 1,000 mg by mouth daily.   Vitamin D 2000 units tablet Take 2,000 Units by mouth daily.      Allergies  Allergen Reactions  . Dextromethorphan Polistirex Er     Pt states caused "a lump" in her throat, making it difficult to swallow  . Hydrocodone Nausea And Vomiting  . Oxycodone-Acetaminophen Other (See Comments)    Made fingers tingle and go numb,  started "feeling weird".   . Penicillins     Unknown: as child   Follow-up Information    Wardell Honour, MD. Call.   Specialty:  Family Medicine Why:  for appointment and see in  3-5 days  Contact information: [place] [address] [phone]        Bjorn Loser, MD. Call.   Specialty:  Urology Why:  for follow up appointment  Contact information: [place] [address] [phone]        Bjorn Loser, MD .   Specialty:  Urology Contact information: [place] [address] [phone]            The results of significant diagnostics from this hospitalization (including imaging, microbiology, ancillary and laboratory) are listed below  for reference.    Significant Diagnostic Studies: Dg Chest 2 View  Result Date: 05/23/2017 CLINICAL DATA:  Preop, fever, dyspnea. EXAM: CHEST  2 VIEW COMPARISON:  10/21/2015 FINDINGS: The heart size and mediastinal contours are within normal limits. Both lungs are clear. The visualized skeletal structures are nonacute. IMPRESSION: No active cardiopulmonary disease. Electronically Signed   By: Ashley Royalty M.D.   On: 05/23/2017 14:21   Dg C-arm 1-60 Min-no Report  Result Date: 05/23/2017 Fluoroscopy was utilized by the requesting physician.  No radiographic interpretation.    Microbiology: Recent Results (from the past 240 hour(s))  Urine Culture     Status: None   Collection Time: 05/23/17  6:08 PM  Result Value Ref Range Status   Specimen Description CYSTOSCOPY URINE  Final   Special Requests NONE  Final   Culture   Final    NO GROWTH Performed at [facility], [address], [place], [place] [address]    Report Status 05/25/2017 FINAL  Final  Culture, blood (routine x 2)     Status: None (Preliminary result)   Collection Time: 05/23/17  6:55 PM  Result Value Ref Range Status   Specimen Description BLOOD RIGHT ANTECUBITAL  Final   Special  Requests   Final    BOTTLES DRAWN AEROBIC ONLY Blood Culture adequate volume   Culture   Final    NO GROWTH < 12 HOURS Performed at [facility], [place] [address] [place], [place] [address]    Report Status PENDING  Incomplete  Culture, blood (routine x 2)     Status: None (Preliminary result)   Collection Time: 05/23/17  6:59 PM  Result Value Ref Range Status   Specimen Description BLOOD RIGHT ARM  Final   Special Requests   Final    BOTTLES DRAWN AEROBIC AND ANAEROBIC Blood Culture adequate volume   Culture   Final    NO GROWTH < 12 HOURS Performed at [facility], [address], [place], [place] [address]    Report Status PENDING  Incomplete  Culture, Urine     Status: None   Collection Time: 05/24/17  6:08 AM  Result Value Ref Range Status   Specimen Description URINE, CLEAN CATCH  Final   Special Requests NONE  Final   Culture   Final    NO GROWTH Performed at [facility], [place] [address], [place],  [address]    Report Status 05/25/2017 FINAL  Final     Labs: Basic Metabolic Panel: Recent Labs  Lab 05/23/17 1350 05/23/17 1855 05/24/17 0416  NA 136 132* 138  K 3.2* 3.4* 3.5  CL 95* 95* 104  CO2 27 26 25   GLUCOSE 96 153* 212*  BUN 22* 21* 21*  CREATININE 1.00 0.89 0.70  CALCIUM 9.8 8.8* 8.9  MG  --  2.0  --    Liver Function Tests: Recent Labs  Lab 05/23/17 1855 05/24/17 0416  AST 15 16  ALT 13* 16  ALKPHOS 56 59  BILITOT 1.0 0.6  PROT 7.1 7.2  ALBUMIN 3.7 3.4*   No results for input(s): LIPASE, AMYLASE in the last 168 hours. No results for input(s): AMMONIA in the last 168 hours. CBC: Recent Labs  Lab 05/23/17 1350  WBC 16.0*  HGB 13.2  HCT 37.8  MCV 88.1  PLT 223   Cardiac Enzymes: No results for input(s): CKTOTAL, CKMB, CKMBINDEX, TROPONINI in the last 168 hours. BNP: BNP (last 3 results) No  results for input(s): BNP in the last 8760 hours.  ProBNP (last 3 results) No results for input(s): PROBNP in the last  8760 hours.  CBG: No results for input(s): GLUCAP in the last 168 hours.     SignedKinnie Feil  [facility] 05/25/2017, 11:17 AM

## 2017-05-25 NOTE — Discharge Instructions (Signed)
Shortness of Breath, Adult  Shortness of breath means you have trouble breathing. Your lungs are organs for breathing.  Follow these instructions at home:  Pay attention to any changes in your symptoms. Take these actions to help with your condition:  ? Do not smoke. Smoking can cause shortness of breath. If you need help to quit smoking, ask your doctor.  ? Avoid things that can make it harder to breathe, such as:  ? Mold.  ? Dust.  ? Air pollution.  ? Chemical smells.  ? Things that can cause allergy symptoms (allergens), if you have allergies.  ? Keep your living space clean and free of mold and dust.  ? Rest as needed. Slowly return to your usual activities.  ? Take over-the-counter and prescription medicines, including oxygen and inhaled medicines, only as told by your doctor.  ? Keep all follow-up visits as told by your doctor. This is important.  Contact a doctor if:  ? Your condition does not get better as soon as expected.  ? You have a hard time doing your normal activities, even after you rest.  ? You have new symptoms.  Get help right away if:  ? You have trouble breathing when you are resting.  ? You feel light-headed or you faint.  ? You have a cough that is not helped by medicines.  ? You cough up blood.  ? You have pain with breathing.  ? You have pain in your chest, arms, shoulders, or belly (abdomen).  ? You have a fever.  ? You cannot walk up stairs.  ? You cannot exercise the way you normally do.  This information is not intended to replace advice given to you by your health care provider. Make sure you discuss any questions you have with your health care provider.  Document Released: 11/22/2007 Document Revised: 06/22/2016 Document Reviewed: 06/22/2016  Elsevier Interactive Patient Education ? 2017 Elsevier Inc[employer]

## 2017-05-25 NOTE — Progress Notes (Signed)
Urology Consult Note   2 Days Post-Op s/p stent placement for obstructing stone  Subjective: NAEON. Bladder spasms and lite hematuria.   Objective: Vital signs in last 24 hours: Temp:  [98 F (36.7 C)-98.9 F (37.2 C)] 98 F (36.7 C) (12/07 0526) Pulse Rate:  [68-76] 69 (12/07 0526) Resp:  [16] 16 (12/07 0526) BP: (113-129)/(72-79) 113/72 (12/07 0526) SpO2:  [98 %-100 %] 100 % (12/07 0526)  Intake/Output from previous day: 12/06 0701 - 12/07 0700 In: 2445 [I.V.:2195; IV Piggyback:250] Out: 750 [Urine:750] Intake/Output this shift: No intake/output data recorded.  Physical Exam:  General: Alert and oriented CV: RRR Lungs: Clear Abdomen: Soft, appropriately tender.  GU: No foley in place  Ext: NT, No erythema  Lab Results: Recent Labs    05/23/17 1350  HGB 13.2  HCT 37.8   BMET Recent Labs    05/23/17 1855 05/24/17 0416  NA 132* 138  K 3.4* 3.5  CL 95* 104  CO2 26 25  GLUCOSE 153* 212*  BUN 21* 21*  CREATININE 0.89 0.70  CALCIUM 8.8* 8.9     Studies/Results: Dg Chest 2 View  Result Date: 05/23/2017 CLINICAL DATA:  Preop, fever, dyspnea. EXAM: CHEST  2 VIEW COMPARISON:  10/21/2015 FINDINGS: The heart size and mediastinal contours are within normal limits. Both lungs are clear. The visualized skeletal structures are nonacute. IMPRESSION: No active cardiopulmonary disease. Electronically Signed   By: Ashley Royalty M.D.   On: 05/23/2017 14:21   Dg C-arm 1-60 Min-no Report  Result Date: 05/23/2017 Fluoroscopy was utilized by the requesting physician.  No radiographic interpretation.    Assessment/Plan:  51 y.o. female s/p stent placement for obstructing stone. On IV cefpodoxime and vanc.  Overall doing well post-op.   - Ok to transition to oral abx  - Follow up Ucx and tailor abx per data  - Urology will coordiange outpatient for definitive stone treatment   Dispo: per primary team    LOS: 2 days   Alla Feeling, MD 05/25/2017, 7:48 AM

## 2017-05-26 ENCOUNTER — Other Ambulatory Visit: Payer: PRIVATE HEALTH INSURANCE

## 2017-05-28 ENCOUNTER — Ambulatory Visit (INDEPENDENT_AMBULATORY_CARE_PROVIDER_SITE_OTHER): Payer: PRIVATE HEALTH INSURANCE | Admitting: Orthopaedic Surgery

## 2017-05-28 LAB — CULTURE, BLOOD (ROUTINE X 2)
CULTURE: NO GROWTH
CULTURE: NO GROWTH
Special Requests: ADEQUATE
Special Requests: ADEQUATE

## 2017-06-01 ENCOUNTER — Encounter (HOSPITAL_BASED_OUTPATIENT_CLINIC_OR_DEPARTMENT_OTHER): Payer: Self-pay | Admitting: *Deleted

## 2017-06-01 ENCOUNTER — Other Ambulatory Visit: Payer: Self-pay

## 2017-06-01 ENCOUNTER — Other Ambulatory Visit: Payer: Self-pay | Admitting: Urology

## 2017-06-01 NOTE — Progress Notes (Signed)
NPO AFTER MIDNIGHT ARRIVE [facility] EKG 05-23-17 Epic AND ON CHART, CHEST XRAY 05-23-17 Epic AND ON CHART, CMET 05-24-17 ON CHART AND Epic, CBC 05-23-17 ABNORMAL WBC 16.0 WILL REPEAT DAY OF SURGERY. NEEDS CBC DRIVER DAUGHTER.

## 2017-06-03 NOTE — H&P (Signed)
CC: I have kidney stones.(Surgery)  HPI: Sharon [name] is a 51 year-old female established patient who is here for renal calculi after a surgical intervention.  The problem is on the right side. She had stent for treatment of her renal calculi. Patient denies ureteroscopy, eswl, and percutaneous lithotomy. This procedure was done 05/23/2017. She did not pass stone fragments. This is her first kidney stone. She does have a stent in place.   She is currently having flank pain and back pain. She denies having groin pain, nausea, vomiting, fever, and chills.   She does not have dysuria. She does have urgency. She does have frequency.   Underwent urgent ureteral stent placement on the right on 05/23/2017 for an obstructing ureteral calculus with associated concern for sepsis.     ALLERGIES: dextromethorphan Hydrocodone - Vomiting Penicillin - lump in throat    MEDICATIONS: Cipro 500 mg tablet  Acetaminophen 500 mg tablet  Acyclovir  Bystolic  Celecoxib  Clonazepam  Diclofenac Sodium  Ibuprofen 600 mg tablet  Ketoconazole 2 % cream  Mineral Oil  Oxycodone-Acetaminophen 5 mg-325 mg tablet  Pantoprazole Sodium  Primidone  Ranitidine Hcl  Rectiv 0.4 % (w/w) ointment  Vitamin B12  Vitamin C  Vitamin D     GU PSH: Cystoscopy - 02/04/2016 Locm 300-399Mg /Ml Iodine,1Ml - 02/18/2016      PSH Notes: Bladder sling  detached retinas bilateral  cataract lens implants  right ear drum repair  hysterectomy  hemorrhoid repair  rectal mesh implant     NON-GU PSH: None         GU PMH: Acute Cystitis/UTI - 05/23/2017 Ureteral calculus - 05/23/2017 Microscopic hematuria - 02/04/2016 Nocturia - 02/04/2016       PMH Notes: Blood clot in leg/DVT  Blood clot in lung/PE  Heart Murmur      NON-GU PMH: Anxiety Arthritis Glaucoma Heartburn Personal history of other diseases of the circulatory system     FAMILY HISTORY: 1 Daughter - Other Blood In Urine - Mother COPD (chronic obstructive  pulmonary disease) with - Mother Diabetes - Father father deceased at age 72 brain aneurism - Other fibromyalgia - Mother Heart abnormality - Brother Heart Disease - Father Kidney Stones - Father mother deceased at age 40 - Other Osteoporosis - Mother scoliosis - Mother     Notes: mother died from heard attack and COPD  Father died from diabetes and heart disease    SOCIAL HISTORY: Marital Status: Single Current Smoking Status: Patient has never smoked.  Has never drank.  Drinks 2 caffeinated drinks per day. 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     REVIEW OF SYSTEMS:     GU Review Female:  Patient reports frequent urination, hard to postpone urination, burning /pain with urination, and get up at night to urinate. Patient denies leakage of urine, stream starts and stops, trouble starting your stream, have to strain to urinate, and being pregnant.    Gastrointestinal (Upper):  Patient denies nausea, vomiting, and indigestion/ heartburn.    Gastrointestinal (Lower):  Patient denies diarrhea and constipation.    Constitutional:  Patient reports night sweats and fatigue. Patient denies fever and weight loss.    Skin:  Patient denies skin rash/ lesion and itching.    Eyes:  Patient denies blurred vision and double vision.    Ears/ Nose/ Throat:  Patient denies sore throat and sinus problems.    Hematologic/Lymphatic:  Patient denies swollen glands and easy bruising.    Cardiovascular:  Patient denies leg swelling and chest  pains.    Respiratory:  Patient denies cough and shortness of breath.    Endocrine:  Patient denies excessive thirst.    Musculoskeletal:  Patient reports joint pain. Patient denies back pain.    Neurological:  Patient denies headaches and dizziness.    Psychologic:  Patient denies depression and anxiety.    VITAL SIGNS:       06/01/2017 03:09 PM     Weight 154 lb / 69.85 kg     BP 113/73 mmHg     Pulse 78 /min     Temperature 98.7 F / 37.0 C     MULTI-SYSTEM  PHYSICAL EXAMINATION:      Constitutional: Well-nourished. No physical deformities. Normally developed. Good grooming.     Respiratory: No labored breathing, no use of accessory muscles.      Cardiovascular: Normal temperature, adequate peripheral perfusion     Skin: No paleness, no jaundice     Neurologic / Psychiatric: Oriented to time, oriented to place, oriented to person. No depression, no anxiety, no agitation.     Gastrointestinal: No mass, no tenderness, no rigidity, non obese abdomen. No CVA tenderness bilaterally     Eyes: Normal conjunctivae. Normal eyelids.     Musculoskeletal: Normal gait and station of head and neck.            PAST DATA REVIEWED:   Source Of History:  Patient  Records Review:  Previous Doctor Records  X-Ray Review: C.T. Abdomen/Pelvis: Reviewed Films. Reviewed Report. Discussed With Patient.     PROCEDURES:    Urinalysis w/Scope  Dipstick Dipstick Cont'd Micro  Color: Yellow Bilirubin: Neg WBC/hpf: 6 - 10/hpf  Appearance: Cloudy Ketones: Neg RBC/hpf: 40 - 60/hpf  Specific Gravity: 1.025 Blood: 3+ Bacteria: Rare (0-9/hpf)  pH: 5.5 Protein: 2+ Cystals: NS (Not Seen)  Glucose: Neg Urobilinogen: 0.2 Casts: NS (Not Seen)   Nitrites: Neg Trichomonas: Not Present   Leukocyte Esterase: Neg Mucous: Present    Epithelial Cells: 0 - 5/hpf    Yeast: NS (Not Seen)    Sperm: Not Present    ASSESSMENT:     ICD-10 Details  1 GU:  Ureteral calculus - N20.1    PLAN:   Orders  Labs Urine Culture  Document  Letter(s):  Created for Patient: Clinical Summary   Notes:  I recommended proceeding with right ureteroscopy with laser lithotripsy and ureteral stent placement. She understands the potential risks including but not limited to bleeding, infection, injury to surrounding structures, need for additional procedures.   Signed by Link Snuffer, III, M.D. on 06/01/17 at 4:17 PM (EST

## 2017-06-04 ENCOUNTER — Encounter (HOSPITAL_BASED_OUTPATIENT_CLINIC_OR_DEPARTMENT_OTHER): Payer: Self-pay

## 2017-06-04 ENCOUNTER — Ambulatory Visit (HOSPITAL_BASED_OUTPATIENT_CLINIC_OR_DEPARTMENT_OTHER): Payer: PRIVATE HEALTH INSURANCE | Admitting: Anesthesiology

## 2017-06-04 ENCOUNTER — Ambulatory Visit (HOSPITAL_BASED_OUTPATIENT_CLINIC_OR_DEPARTMENT_OTHER)
Admission: RE | Admit: 2017-06-04 | Discharge: 2017-06-04 | Disposition: A | Discharge status: Home / Self Care | Payer: PRIVATE HEALTH INSURANCE | Source: Ambulatory Visit | Attending: Urology | Admitting: Urology

## 2017-06-04 ENCOUNTER — Encounter (HOSPITAL_BASED_OUTPATIENT_CLINIC_OR_DEPARTMENT_OTHER)
Admission: RE | Disposition: A | Discharge status: Home / Self Care | Payer: Self-pay | Source: Ambulatory Visit | Attending: Urology

## 2017-06-04 DIAGNOSIS — N202 Calculus of kidney with calculus of ureter: Secondary | ICD-10-CM | POA: Diagnosis not present

## 2017-06-04 DIAGNOSIS — I10 Essential (primary) hypertension: Secondary | ICD-10-CM | POA: Diagnosis not present

## 2017-06-04 DIAGNOSIS — M1990 Unspecified osteoarthritis, unspecified site: Secondary | ICD-10-CM | POA: Insufficient documentation

## 2017-06-04 DIAGNOSIS — H409 Unspecified glaucoma: Secondary | ICD-10-CM | POA: Insufficient documentation

## 2017-06-04 DIAGNOSIS — Z79899 Other long term (current) drug therapy: Secondary | ICD-10-CM | POA: Diagnosis not present

## 2017-06-04 DIAGNOSIS — K219 Gastro-esophageal reflux disease without esophagitis: Secondary | ICD-10-CM | POA: Insufficient documentation

## 2017-06-04 DIAGNOSIS — N201 Calculus of ureter: Secondary | ICD-10-CM | POA: Diagnosis not present

## 2017-06-04 DIAGNOSIS — Z880 Allergy status to penicillin: Secondary | ICD-10-CM | POA: Insufficient documentation

## 2017-06-04 DIAGNOSIS — Z885 Allergy status to narcotic agent status: Secondary | ICD-10-CM | POA: Diagnosis not present

## 2017-06-04 HISTORY — DX: Family history of other specified conditions: Z84.89

## 2017-06-04 HISTORY — DX: Nausea with vomiting, unspecified: R11.2

## 2017-06-04 HISTORY — DX: Irritable bowel syndrome, unspecified: K58.9

## 2017-06-04 HISTORY — DX: Unspecified glaucoma: H40.9

## 2017-06-04 HISTORY — PX: CYSTOSCOPY W/ URETERAL STENT PLACEMENT: SHX1429

## 2017-06-04 HISTORY — DX: Other specified postprocedural states: Z98.890

## 2017-06-04 HISTORY — DX: Unspecified perforation of tympanic membrane, right ear: H72.91

## 2017-06-04 LAB — CBC
HCT: 40.4 % (ref 36.0–46.0)
Hemoglobin: 13.4 g/dL (ref 12.0–15.0)
MCH: 30.2 pg (ref 26.0–34.0)
MCHC: 33.2 g/dL (ref 30.0–36.0)
MCV: 91.2 fL (ref 78.0–100.0)
PLATELETS: 222 10*3/uL (ref 150–400)
RBC: 4.43 MIL/uL (ref 3.87–5.11)
RDW: 13.3 % (ref 11.5–15.5)
WBC: 4.8 10*3/uL (ref 4.0–10.5)

## 2017-06-04 SURGERY — CYSTOSCOPY, WITH RETROGRADE PYELOGRAM AND URETERAL STENT INSERTION
Anesthesia: General | Site: Bladder | Laterality: Right

## 2017-06-04 MED ORDER — FENTANYL CITRATE (PF) 100 MCG/2ML IJ SOLN
INTRAMUSCULAR | Status: DC | PRN
  Administered 2017-06-04: 50 ug via INTRAVENOUS
  Administered 2017-06-04 (×6): 25 ug via INTRAVENOUS

## 2017-06-04 MED ORDER — OXYBUTYNIN CHLORIDE 5 MG PO TABS: 5.0000 mg | ORAL_TABLET | Freq: Three times a day (TID) | ORAL | 3 refills | Status: DC | PRN

## 2017-06-04 MED ORDER — TAMSULOSIN HCL 0.4 MG PO CAPS
0.4000 mg | ORAL_CAPSULE | Freq: Every day | ORAL | Status: DC
  Administered 2017-06-04: 0.4 mg via ORAL
  Filled 2017-06-04: qty 1

## 2017-06-04 MED ORDER — KETOROLAC TROMETHAMINE 30 MG/ML IJ SOLN
INTRAMUSCULAR | Status: AC
  Filled 2017-06-04: qty 1

## 2017-06-04 MED ORDER — PROPOFOL 10 MG/ML IV BOLUS
INTRAVENOUS | Status: DC | PRN
  Administered 2017-06-04: 200 mg via INTRAVENOUS

## 2017-06-04 MED ORDER — SODIUM CHLORIDE 0.9 % IR SOLN
Status: DC | PRN
  Administered 2017-06-04: 3000 mL via INTRAVESICAL

## 2017-06-04 MED ORDER — FENTANYL CITRATE (PF) 100 MCG/2ML IJ SOLN
INTRAMUSCULAR | Status: AC
  Filled 2017-06-04: qty 2

## 2017-06-04 MED ORDER — IOHEXOL 300 MG/ML  SOLN
INTRAMUSCULAR | Status: DC | PRN
  Administered 2017-06-04: 10 mL via INTRAVENOUS

## 2017-06-04 MED ORDER — LIDOCAINE 2% (20 MG/ML) 5 ML SYRINGE
INTRAMUSCULAR | Status: AC
  Filled 2017-06-04: qty 5

## 2017-06-04 MED ORDER — LACTATED RINGERS IV SOLN
INTRAVENOUS | Status: DC
  Administered 2017-06-04: 1000 mL via INTRAVENOUS
  Filled 2017-06-04: qty 1000

## 2017-06-04 MED ORDER — OXYBUTYNIN CHLORIDE 5 MG PO TABS
ORAL_TABLET | ORAL | Status: AC
  Filled 2017-06-04: qty 1

## 2017-06-04 MED ORDER — PROPOFOL 10 MG/ML IV BOLUS
INTRAVENOUS | Status: AC
  Filled 2017-06-04: qty 20

## 2017-06-04 MED ORDER — MIDAZOLAM HCL 2 MG/2ML IJ SOLN
INTRAMUSCULAR | Status: AC
  Filled 2017-06-04: qty 2

## 2017-06-04 MED ORDER — TAMSULOSIN HCL 0.4 MG PO CAPS
ORAL_CAPSULE | ORAL | Status: AC
  Filled 2017-06-04: qty 1

## 2017-06-04 MED ORDER — MIDAZOLAM HCL 5 MG/5ML IJ SOLN
INTRAMUSCULAR | Status: DC | PRN
  Administered 2017-06-04: 2 mg via INTRAVENOUS

## 2017-06-04 MED ORDER — CIPROFLOXACIN IN D5W 400 MG/200ML IV SOLN
INTRAVENOUS | Status: AC
  Filled 2017-06-04: qty 200

## 2017-06-04 MED ORDER — OXYBUTYNIN CHLORIDE 5 MG PO TABS
5.0000 mg | ORAL_TABLET | Freq: Three times a day (TID) | ORAL | Status: DC | PRN
  Administered 2017-06-04: 5 mg via ORAL
  Filled 2017-06-04: qty 1

## 2017-06-04 MED ORDER — KETOROLAC TROMETHAMINE 30 MG/ML IJ SOLN
INTRAMUSCULAR | Status: DC | PRN
  Administered 2017-06-04: 30 mg via INTRAVENOUS

## 2017-06-04 MED ORDER — TRAMADOL HCL 50 MG PO TABS: 50.0000 mg | ORAL_TABLET | Freq: Four times a day (QID) | ORAL | 0 refills | Status: DC | PRN

## 2017-06-04 MED ORDER — DEXAMETHASONE SODIUM PHOSPHATE 10 MG/ML IJ SOLN
INTRAMUSCULAR | Status: DC | PRN
  Administered 2017-06-04: 10 mg via INTRAVENOUS

## 2017-06-04 MED ORDER — TAMSULOSIN HCL 0.4 MG PO CAPS: 0.4000 mg | ORAL_CAPSULE | Freq: Every day | ORAL | 3 refills | Status: DC

## 2017-06-04 MED ORDER — CIPROFLOXACIN IN D5W 400 MG/200ML IV SOLN
INTRAVENOUS | Status: DC | PRN
  Administered 2017-06-04: 400 mg via INTRAVENOUS

## 2017-06-04 MED ORDER — DEXAMETHASONE SODIUM PHOSPHATE 10 MG/ML IJ SOLN
INTRAMUSCULAR | Status: AC
  Filled 2017-06-04: qty 1

## 2017-06-04 MED ORDER — ONDANSETRON HCL 4 MG/2ML IJ SOLN
INTRAMUSCULAR | Status: DC | PRN
  Administered 2017-06-04: 4 mg via INTRAVENOUS

## 2017-06-04 MED ORDER — ONDANSETRON HCL 4 MG/2ML IJ SOLN
INTRAMUSCULAR | Status: AC
  Filled 2017-06-04: qty 2

## 2017-06-04 MED ORDER — LIDOCAINE HCL (CARDIAC) 20 MG/ML IV SOLN
INTRAVENOUS | Status: DC | PRN
  Administered 2017-06-04: 100 mg via INTRAVENOUS

## 2017-06-04 SURGICAL SUPPLY — 20 items
BAG DRAIN URO-CYSTO SKYTR STRL (DRAIN) ×3 IMPLANT
BAG DRN UROCATH (DRAIN) ×1
BASKET ZERO TIP NITINOL 2.4FR (BASKET) ×3 IMPLANT
CATH INTERMIT  6FR 70CM (CATHETERS) IMPLANT
CLOTH BEACON ORANGE TIMEOUT ST (SAFETY) ×3 IMPLANT
FIBER LASER FLEXIVA 365 (UROLOGICAL SUPPLIES) IMPLANT
FIBER LASER TRAC TIP (UROLOGICAL SUPPLIES) IMPLANT
GLOVE BIO SURGEON STRL SZ7.5 (GLOVE) ×3 IMPLANT
GOWN STRL REUS W/TWL XL LVL3 (GOWN DISPOSABLE) ×3 IMPLANT
GUIDEWIRE STR DUAL SENSOR (WIRE) ×3 IMPLANT
INFUSOR MANOMETER BAG 3000ML (MISCELLANEOUS) ×3 IMPLANT
IV NS 1000ML (IV SOLUTION) ×2
IV NS 1000ML BAXH (IV SOLUTION) ×1 IMPLANT
IV NS IRRIG 3000ML ARTHROMATIC (IV SOLUTION) ×3 IMPLANT
KIT RM TURNOVER CYSTO AR (KITS) ×3 IMPLANT
MANIFOLD NEPTUNE II (INSTRUMENTS) ×3 IMPLANT
NS IRRIG 500ML POUR BTL (IV SOLUTION) ×6 IMPLANT
PACK CYSTO (CUSTOM PROCEDURE TRAY) ×3 IMPLANT
TUBE CONNECTING 12'X1/4 (SUCTIONS)
TUBE CONNECTING 12X1/4 (SUCTIONS) IMPLANT

## 2017-06-04 NOTE — Op Note (Signed)
Operative Note  Preoperative diagnosis:  1.  Right ureteral calculus  Postoperative diagnosis: 1.  Right ureteral calculus  Procedure(s): 1.  Cystourethroscopy with right retrograde pyelogram with interpretation, right ureteroscopy with stone extraction and ureteral stent exchange, fluoroscopy less than 1 hour  Surgeon: Link Snuffer, MD  Assistants: None  Anesthesia: General  Complications: None immediate  EBL: Minimal  Specimens: 1.  Ureteral calculus  Drains/Catheters: 1.  6 x 26 double-J ureteral stent  Intraoperative findings: 5 mm proximal ureteral calculus easily extracted without any trauma to the ureter.  Retrograde pyelogram revealed a well opacified kidney with no filling defects  Indication: 51 year old female status post urgent ureteral stent placement for a right ureteral calculus and infection presents for definitive management.  Description of procedure:  The patient was identified and consent was obtained.  The patient was taken to the operating room and placed in the supine position.  The patient was placed under general anesthesia.  Perioperative antibiotics were administered.  The patient was placed in dorsal lithotomy.  Patient was prepped and draped in a standard sterile fashion and a timeout was performed.  A 23 French rigid cystoscope was advanced into the bladder and the stent was grasped and pulled just beyond the urethral meatus.  A sensor wire was advanced up to the kidney under fluoroscopic guidance and the stent was removed.  A semirigid ureteroscope was advanced up the ureter to the stone of interest which was basket extracted atraumatically.  There was a second tiny stone which was also basket extracted on the second look with the ureteroscope.  I then readvanced the ureteroscope almost to the level of the renal pelvis and no other ureteral calculi were seen.  No injury to the ureter was seen.  I shot a retrograde pyelogram through the scope with the  findings noted above.  I withdrew the scope and backloaded the wire onto the cystoscope and advanced it into the bladder.  I then advanced a 6 x 26 double-J ureteral stent over the wire and into the kidney.  The wire was withdrawn.  Fluoroscopy confirmed proximal placement and direct visualization confirmed a good coil within the bladder.  I drained the bladder and withdrew the scope and this concluded the operation.  Patient tolerated procedure well and was stable postoperatively.  Plan: Patient will return in several days for a ureteral stent removal in the clinic.  She will then need to see me in 4-6 weeks for a renal ultrasound.

## 2017-06-04 NOTE — Discharge Instructions (Addendum)
[facility] °[phone] °Post Ureteroscopy With or Without Stent Instructions ° °Definitions: ° °Ureter: The duct that transports urine from the kidney to the bladder. °Stent:   A plastic hollow tube that is placed into the ureter, from the kidney to the                 bladder to prevent the ureter from swelling shut. ° °GENERAL INSTRUCTIONS: ° °Despite the fact that no skin incisions were used, the area around the ureter and bladder is raw and irritated. The stent is a foreign body which will further irritate the bladder wall. This irritation is manifested by increased frequency of urination, both day and night, and by an increase in the urge to urinate. In some, the urge to urinate is present almost always. Sometimes the urge is strong enough that you may not be able to stop yourself from urinating. The only real cure is to remove the stent and then give time for the bladder wall to heal which can't be done until the danger of the ureter swelling shut has passed, which varies. ° °You may see some blood in your urine while the stent is in place and a few days afterwards. Do not be alarmed, even if the urine was clear for a while. Get off your feet and drink lots of fluids until clearing occurs. If you start to pass clots or don't improve, call us. ° °DIET: °You may return to your normal diet immediately. Because of the raw surface of your bladder, alcohol, spicy foods, acid type foods and drinks with caffeine may cause irritation or frequency and should be used in moderation. To keep your urine flowing freely and to avoid constipation, drink plenty of fluids during the day ( 8-10 glasses ). °Tip: Avoid cranberry juice because it is very acidic. ° °ACTIVITY: °Your physical activity doesn't need to be restricted. However, if you are very active, you may see some blood in your urine. We suggest that you reduce your activity under these circumstances until the bleeding has stopped. ° °BOWELS: °It is  important to keep your bowels regular during the postoperative period. Straining with bowel movements can cause bleeding. A bowel movement every other day is reasonable. Use a mild laxative if needed, such as Milk of Magnesia 2-3 tablespoons, or 2 Dulcolax tablets. Call if you continue to have problems. If you have been taking narcotics for pain, before, during or after your surgery, you may be constipated. Take a laxative if necessary. ° ° °MEDICATION: °You should resume your pre-surgery medications unless told not to. °You may take oxybutynin or flomax if prescribed for bladder spasms or discomfort from the stent °Take pain medication as directed for pain refractory to conservative management ° °PROBLEMS YOU SHOULD REPORT TO US: °· Fevers over 100.5 Fahrenheit. °· Heavy bleeding, or clots ( See above notes about blood in urine ). °· Inability to urinate. °· Drug reactions ( hives, rash, nausea, vomiting, diarrhea ). °· Severe burning or pain with urination that is not improving. ° ° °Post Anesthesia Home Care Instructions ° °Activity: °Get plenty of rest for the remainder of the day. A responsible individual must stay with you for 24 hours following the procedure.  °For the next 24 hours, DO NOT: °-Drive a car °-Operate machinery °-Drink alcoholic beverages °-Take any medication unless instructed by your physician °-Make any legal decisions or sign important papers. ° °Meals: °Start with liquid foods such as gelatin or soup. Progress to regular foods   as tolerated. Avoid greasy, spicy, heavy foods. If nausea and/or vomiting occur, drink only clear liquids until the nausea and/or vomiting subsides. Call your physician if vomiting continues. ° °Special Instructions/Symptoms: °Your throat may feel dry or sore from the anesthesia or the breathing tube placed in your throat during surgery. If this causes discomfort, gargle with warm salt water. The discomfort should disappear within 24 hours. ° °If you had a scopolamine  patch placed behind your ear for the management of post- operative nausea and/or vomiting: ° °1. The medication in the patch is effective for 72 hours, after which it should be removed.  Wrap patch in a tissue and discard in the trash. Wash hands thoroughly with soap and water. °2. You may remove the patch earlier than 72 hours if you experience unpleasant side effects which may include dry mouth, dizziness or visual disturbances. °3. Avoid touching the patch. Wash your hands with soap and water after contact with the patch. °  ° ° °

## 2017-06-04 NOTE — Transfer of Care (Signed)
Immediate Anesthesia Transfer of Care Note  Patient: Sharon [name]  Procedure(s) Performed: Procedure(s) (LRB): CYSTOSCOPY WITHRIGHT RETROGRADE PYELOGRAMRIGHT /URETEREOSCOPY  STENT PLACEMENT (Right)  Patient Location: PACU  Anesthesia Type: General  Level of Consciousness: awake, sedated, patient cooperative and responds to stimulation  Airway & Oxygen Therapy: Patient Spontanous Breathing and Patient connected to [place] oxygen  Post-op Assessment: Report given to PACU RN, Post -op Vital signs reviewed and stable and Patient moving all extremities  Post vital signs: Reviewed and stable  Complications: No apparent anesthesia complications

## 2017-06-04 NOTE — Anesthesia Preprocedure Evaluation (Signed)
Anesthesia Evaluation  Patient identified by MRN, date of birth, ID band Patient awake    History of Anesthesia Complications (+) PONV and Family history of anesthesia reaction  Airway Mallampati: II  TM Distance: >3 FB     Dental   Pulmonary shortness of breath,    breath sounds clear to auscultation       Cardiovascular hypertension, + Valvular Problems/Murmurs  Rhythm:Regular Rate:Normal     Neuro/Psych    GI/Hepatic Neg liver ROS, GERD  ,  Endo/Other  negative endocrine ROS  Renal/GU negative Renal ROS     Musculoskeletal   Abdominal   Peds  Hematology   Anesthesia Other Findings   Reproductive/Obstetrics                             Anesthesia Physical Anesthesia Plan  ASA: III  Anesthesia Plan: General   Post-op Pain Management:    Induction: Intravenous  PONV Risk Score and Plan: 4 or greater and Treatment may vary due to age or medical condition, Ondansetron, Dexamethasone and Midazolam  Airway Management Planned: LMA  Additional Equipment:   Intra-op Plan:   Post-operative Plan: Extubation in OR  Informed Consent: I have reviewed the patients History and Physical, chart, labs and discussed the procedure including the risks, benefits and alternatives for the proposed anesthesia with the patient or authorized representative who has indicated his/her understanding and acceptance.   Dental advisory given  Plan Discussed with: CRNA and Anesthesiologist  Anesthesia Plan Comments:         Anesthesia Quick Evaluation

## 2017-06-04 NOTE — Anesthesia Procedure Notes (Signed)
Procedure Name: LMA Insertion Date/Time: 06/04/2017 10:54 AM Performed by: Justice Rocher, CRNA Pre-anesthesia Checklist: Patient identified, Emergency Drugs available, Suction available and Patient being monitored Patient Re-evaluated:Patient Re-evaluated prior to induction Oxygen Delivery Method: Circle system utilized Preoxygenation: Pre-oxygenation with 100% oxygen Induction Type: IV induction Ventilation: Mask ventilation without difficulty LMA: LMA inserted LMA Size: 4.0 Number of attempts: 1 Airway Equipment and Method: Bite block Placement Confirmation: positive ETCO2 and breath sounds checked- equal and bilateral Tube secured with: Tape Dental Injury: Teeth and Oropharynx as per pre-operative assessment

## 2017-06-04 NOTE — Anesthesia Postprocedure Evaluation (Signed)
Anesthesia Post Note  Patient: Sharon [name]  Procedure(s) Performed: [name] RETROGRADE PYELOGRAMRIGHT /URETEREOSCOPY  STENT PLACEMENT (Right Bladder)     Patient location during evaluation: PACU Anesthesia Type: General Level of consciousness: awake Pain management: pain level controlled Vital Signs Assessment: post-procedure vital signs reviewed and stable Respiratory status: spontaneous breathing Cardiovascular status: stable Anesthetic complications: no    Last Vitals:  Vitals:   06/04/17 1200 06/04/17 1215  BP: 121/74 133/83  Pulse: (!) 59 70  Resp: (!) 9 11  Temp:    SpO2: 96% 100%    Last Pain:  Vitals:   06/04/17 0816  TempSrc: Oral                 Tomoya Ringwald

## 2017-06-05 ENCOUNTER — Encounter (HOSPITAL_BASED_OUTPATIENT_CLINIC_OR_DEPARTMENT_OTHER): Payer: Self-pay | Admitting: Urology

## 2017-06-05 ENCOUNTER — Ambulatory Visit
Admission: RE | Admit: 2017-06-05 | Discharge: 2017-06-05 | Disposition: A | Discharge status: Home / Self Care | Payer: PRIVATE HEALTH INSURANCE | Source: Ambulatory Visit | Attending: Orthopaedic Surgery | Admitting: Orthopaedic Surgery

## 2017-06-05 DIAGNOSIS — G8929 Other chronic pain: Secondary | ICD-10-CM

## 2017-06-05 DIAGNOSIS — M25562 Pain in left knee: Principal | ICD-10-CM

## 2017-06-06 ENCOUNTER — Other Ambulatory Visit: Payer: Self-pay | Admitting: Family Medicine

## 2017-06-06 DIAGNOSIS — F411 Generalized anxiety disorder: Secondary | ICD-10-CM

## 2017-06-06 NOTE — Telephone Encounter (Signed)
Last prescription written on 11/08/16 with 5 refills.

## 2017-06-06 NOTE — Telephone Encounter (Signed)
Controlled substance 

## 2017-06-14 ENCOUNTER — Telehealth: Payer: Self-pay | Admitting: Family Medicine

## 2017-06-14 NOTE — Telephone Encounter (Signed)
Pt states that she is out of the medication.

## 2017-06-14 NOTE — Telephone Encounter (Signed)
Copied from [place] [phone]. Topic: Quick Communication - Rx Refill/Question >> Jun 14, 2017 10:05 AM Malena Catholic I, NT wrote: Has the patient contacted their pharmacy yes    (Agent: If no, request that the patient contact the pharmacy for the refill Klonopin 0.05 MG   Preferred Pharmacy (with phone number or street name [facility] [place] [phone]   Agent: Please be advised that RX refills may take up to 3 business days. We ask that you follow-up with your pharmacy.

## 2017-06-15 NOTE — Telephone Encounter (Signed)
Call --- refill of Clonazepam sent to [facility].

## 2017-06-16 NOTE — Telephone Encounter (Signed)
Done on 12/28

## 2017-06-28 ENCOUNTER — Ambulatory Visit (INDEPENDENT_AMBULATORY_CARE_PROVIDER_SITE_OTHER): Payer: PRIVATE HEALTH INSURANCE | Admitting: Orthopaedic Surgery

## 2017-06-28 ENCOUNTER — Encounter (INDEPENDENT_AMBULATORY_CARE_PROVIDER_SITE_OTHER): Payer: Self-pay | Admitting: Orthopaedic Surgery

## 2017-06-28 DIAGNOSIS — M23322 Other meniscus derangements, posterior horn of medial meniscus, left knee: Secondary | ICD-10-CM | POA: Diagnosis not present

## 2017-06-28 NOTE — Progress Notes (Signed)
  The patient comes in today to go over the MRI of her left knee.  She is been having several months of worsening left knee pain with locking catching.  Pivoting activities because of severe amount of pain in her left knee.  We have tried steroid injections already and that was not getting better.  She is tried anti-inflammatories and activity modification as well as quad strengthening exercises.  On exam she has significant medial joint line tenderness and a positive McMurray's exam on the medial joint line.  There is a mild effusion.  She has posterior pain as well and there is a palpable Baker's cyst in the posterior aspect of her knee.  MRI is reviewed with her and that she does have a complex tear of the posterior horn and midbody of the medial meniscus.  She has a large Baker's cyst as well and edema in the medial compartment of her knee especially at the medial femoral condyle suggesting a stress response.  The cartilage is only mildly thin.  At this point given the MRI findings and continued mechanical symptoms we are recommending a left knee arthroscopy with a partial medial meniscectomy.  I went over her MRI with her in detail we talked with her using a knee model to describe the surgery involved including a thorough discussion of the risk and benefits of surgery.  She said that she would like to have my surgical scheduler's card and give her a call to work on scheduling surgery sometime later and to talk about what her deductible and other benefits may be in terms of the cause of surgery such as this.  We had a long and thorough discussion.  She will let us know when she like to have a left knee arthroscopy scheduled.  All questions concerns were answered and addressed.

## 2017-06-30 ENCOUNTER — Ambulatory Visit: Payer: PRIVATE HEALTH INSURANCE | Admitting: Family Medicine

## 2017-06-30 ENCOUNTER — Other Ambulatory Visit: Payer: Self-pay

## 2017-06-30 ENCOUNTER — Encounter: Payer: Self-pay | Admitting: Family Medicine

## 2017-06-30 VITALS — BP 126/70 | HR 73 | Temp 98.3°F | Resp 18 | Ht 67.84 in | Wt 164.6 lb

## 2017-06-30 DIAGNOSIS — M23322 Other meniscus derangements, posterior horn of medial meniscus, left knee: Secondary | ICD-10-CM

## 2017-06-30 DIAGNOSIS — M545 Low back pain: Secondary | ICD-10-CM | POA: Diagnosis not present

## 2017-06-30 DIAGNOSIS — N201 Calculus of ureter: Secondary | ICD-10-CM

## 2017-06-30 DIAGNOSIS — F411 Generalized anxiety disorder: Secondary | ICD-10-CM | POA: Diagnosis not present

## 2017-06-30 DIAGNOSIS — G8929 Other chronic pain: Secondary | ICD-10-CM

## 2017-06-30 DIAGNOSIS — R251 Tremor, unspecified: Secondary | ICD-10-CM | POA: Diagnosis not present

## 2017-06-30 DIAGNOSIS — Z8619 Personal history of other infectious and parasitic diseases: Secondary | ICD-10-CM | POA: Diagnosis not present

## 2017-06-30 DIAGNOSIS — R739 Hyperglycemia, unspecified: Secondary | ICD-10-CM | POA: Diagnosis not present

## 2017-06-30 DIAGNOSIS — I10 Essential (primary) hypertension: Secondary | ICD-10-CM | POA: Diagnosis not present

## 2017-06-30 LAB — POCT GLYCOSYLATED HEMOGLOBIN (HGB A1C): HEMOGLOBIN A1C: 5.5

## 2017-06-30 LAB — GLUCOSE, POCT (MANUAL RESULT ENTRY): POC GLUCOSE: 88 mg/dL (ref 70–99)

## 2017-06-30 MED ORDER — PRIMIDONE 50 MG PO TABS: ORAL_TABLET | ORAL | 1 refills | Status: DC

## 2017-06-30 MED ORDER — NEBIVOLOL HCL 5 MG PO TABS: 5.0000 mg | ORAL_TABLET | Freq: Every day | ORAL | 1 refills | Status: DC

## 2017-06-30 MED ORDER — CELECOXIB 200 MG PO CAPS: 200.0000 mg | ORAL_CAPSULE | Freq: Every day | ORAL | 1 refills | Status: DC

## 2017-06-30 MED ORDER — EPINEPHRINE 0.3 MG/0.3ML IJ SOAJ: 0.3000 mg | Freq: Once | INTRAMUSCULAR | 1 refills | Status: AC

## 2017-06-30 MED ORDER — ONDANSETRON HCL 8 MG PO TABS: 8.0000 mg | ORAL_TABLET | Freq: Three times a day (TID) | ORAL | 0 refills | Status: DC | PRN

## 2017-06-30 MED ORDER — CLONAZEPAM 0.5 MG PO TABS: 0.5000 mg | ORAL_TABLET | Freq: Every day | ORAL | 5 refills | Status: DC

## 2017-06-30 NOTE — Patient Instructions (Addendum)
   IF you received an x-ray today, you will receive an invoice from [facility]. Please contact [facility] at [phone] with questions or concerns regarding your invoice.   IF you received labwork today, you will receive an invoice from [facility]. Please contact [facility] at [phone] with questions or concerns regarding your invoice.   Our billing staff will not be able to assist you with questions regarding bills from these companies.  You will be contacted with the lab results as soon as they are available. The fastest way to get your results is to activate your My Chart account. Instructions are located on the last page of this paperwork. If you have not heard from us regarding the results in 2 weeks, please contact this office.     Living With Anxiety After being diagnosed with an anxiety disorder, you may be relieved to know why you have felt or behaved a certain way. It is natural to also feel overwhelmed about the treatment ahead and what it will mean for your life. With care and support, you can manage this condition and recover from it. How to cope with anxiety Dealing with stress Stress is your body's reaction to life changes and events, both good and bad. Stress can last just a few hours or it can be ongoing. Stress can play a major role in anxiety, so it is important to learn both how to cope with stress and how to think about it differently. Talk with your health care provider or a counselor to learn more about stress reduction. He or she may suggest some stress reduction techniques, such as:  Music therapy. This can include creating or listening to music that you enjoy and that inspires you.  Mindfulness-based meditation. This involves being aware of your normal breaths, rather than trying to control your breathing. It can be done while sitting or walking.  Centering prayer. This is a kind of meditation that involves focusing on a word, phrase, or  sacred image that is meaningful to you and that brings you peace.  Deep breathing. To do this, expand your stomach and inhale slowly through your nose. Hold your breath for 3-5 seconds. Then exhale slowly, allowing your stomach muscles to relax.  Self-talk. This is a skill where you identify thought patterns that lead to anxiety reactions and correct those thoughts.  Muscle relaxation. This involves tensing muscles then relaxing them.  Choose a stress reduction technique that fits your lifestyle and personality. Stress reduction techniques take time and practice. Set aside 5-15 minutes a day to do them. Therapists can offer training in these techniques. The training may be covered by some insurance plans. Other things you can do to manage stress include:  Keeping a stress diary. This can help you learn what triggers your stress and ways to control your response.  Thinking about how you respond to certain situations. You may not be able to control everything, but you can control your reaction.  Making time for activities that help you relax, and not feeling guilty about spending your time in this way.  Therapy combined with coping and stress-reduction skills provides the best chance for successful treatment. Medicines Medicines can help ease symptoms. Medicines for anxiety include:  Anti-anxiety drugs.  Antidepressants.  Beta-blockers.  Medicines may be used as the main treatment for anxiety disorder, along with therapy, or if other treatments are not working. Medicines should be prescribed by a health care provider. Relationships Relationships can play a big part in   helping you recover. Try to spend more time connecting with trusted friends and family members. Consider going to couples counseling, taking family education classes, or going to family therapy. Therapy can help you and others better understand the condition. How to recognize changes in your condition Everyone has a  different response to treatment for anxiety. Recovery from anxiety happens when symptoms decrease and stop interfering with your daily activities at home or work. This may mean that you will start to:  Have better concentration and focus.  Sleep better.  Be less irritable.  Have more energy.  Have improved memory.  It is important to recognize when your condition is getting worse. Contact your health care provider if your symptoms interfere with home or work and you do not feel like your condition is improving. Where to find help and support: You can get help and support from these sources:  Self-help groups.  Online and community organizations.  A trusted spiritual leader.  Couples counseling.  Family education classes.  Family therapy.  Follow these instructions at home:  Eat a healthy diet that includes plenty of vegetables, fruits, whole grains, low-fat dairy products, and lean protein. Do not eat a lot of foods that are high in solid fats, added sugars, or salt.  Exercise. Most adults should do the following: ? Exercise for at least 150 minutes each week. The exercise should increase your heart rate and make you sweat (moderate-intensity exercise). ? Strengthening exercises at least twice a week.  Cut down on caffeine, tobacco, alcohol, and other potentially harmful substances.  Get the right amount and quality of sleep. Most adults need 7-9 hours of sleep each night.  Make choices that simplify your life.  Take over-the-counter and prescription medicines only as told by your health care provider.  Avoid caffeine, alcohol, and certain over-the-counter cold medicines. These may make you feel worse. Ask your pharmacist which medicines to avoid.  Keep all follow-up visits as told by your health care provider. This is important. Questions to ask your health care provider  Would I benefit from therapy?  How often should I follow up with a health care  provider?  How long do I need to take medicine?  Are there any long-term side effects of my medicine?  Are there any alternatives to taking medicine? Contact a health care provider if:  You have a hard time staying focused or finishing daily tasks.  You spend many hours a day feeling worried about everyday life.  You become exhausted by worry.  You start to have headaches, feel tense, or have nausea.  You urinate more than normal.  You have diarrhea. Get help right away if:  You have a racing heart and shortness of breath.  You have thoughts of hurting yourself or others. If you ever feel like you may hurt yourself or others, or have thoughts about taking your own life, get help right away. You can go to your nearest emergency department or call:  Your local emergency services (911 in the [place]  A suicide crisis helpline, such as the National Suicide Prevention Lifeline at 1-800-273-8255. This is open 24-hours a day.  Summary  Taking steps to deal with stress can help calm you.  Medicines cannot cure anxiety disorders, but they can help ease symptoms.  Family, friends, and partners can play a big part in helping you recover from an anxiety disorder. This information is not intended to replace advice given to you by your health care provider. Make   sure you discuss any questions you have with your health care provider. Document Released: 05/30/2016 Document Revised: 05/30/2016 Document Reviewed: 05/30/2016 [employer] Interactive Patient Education  2018 [employer]  

## 2017-06-30 NOTE — Progress Notes (Signed)
Subjective:    Patient ID: Sharon [name], female    DOB: April 08, 1966, 52 y.o.   MRN: [id number]  06/30/2017  Medication Refill (celebrex, klonopin,bystolic,nitroglycerin/phenergan, zantac); Hypertension; Anxiety; and Nephrolithiasis    HPI This 52 y.o. female presents for eight month follow-up of hypertension, anxiety, Lymphadenopathy.  Management changes made at last visit include the following:  -hypertension controlled; obtain labs; refills provided. -persistent L anterior LAD; obtain CT neck to rule out underlying mass or pathology;  ENT consultation for persistent swelling/symptoms as well. -anxiety has improved with improved work stressors and relationship improvement.  Refill of Klonopin with plan to wean to off in upcoming year. -s/p orthopedic consultation for chronic lower back pain; hip bursitis and elbow tendonitis has improved.  Refill of Celebrex provided. -refills for other stable chronic medical conditions provided.   LEFT torn meniscus: scheduled for surgery for repair; [facility].  Stress fracture in knee and Baker's cyst.    Ureteral stone: s/p cystoscopy; admission for urosepsis.   Bell is [employer].  Stent placement for one week.  Upcoming appointment with gynecology in February 2019.  Sutures have come through mesh along rectocele.  Has new boyfriend.  Still together; stays with patient all the time.  High school boyfriend.  Concerned that mesh has moved and may need repair.  Mesh has been in place since hysterectomy; six years ago.  Has been suffering with recurrent bacterial infections and yeast infections; eight month duration.    HTN: Patient reports good compliance with medication, good tolerance to medication, and good symptom control.   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  Excessive stress.  GERD: Patient reports good compliance with medication, good tolerance to medication, and good symptom control.    Tremor: Patient  reports good compliance with medication, good tolerance to medication, and good symptom control.    IBS: sees gastroenterologist  Who states that abdominal pain is normal with IBS.  Constant periumbilical pain.  Danis; last appointment 07/27/16.     BP Readings from Last 3 Encounters:  06/30/17 126/70  06/04/17 124/74  05/25/17 113/72   Wt Readings from Last 3 Encounters:  06/30/17 164 lb 9.6 oz (74.7 kg)  06/04/17 158 lb 3.2 oz (71.8 kg)  05/23/17 157 lb 6.4 oz (71.4 kg)   Immunization History  Administered Date(s) Administered  . Influenza Whole 03/19/2010  . Influenza,inj,Quad PF,6+ Mos 03/25/2015  . Tdap 11/08/2016    Review of Systems  Constitutional: Negative for chills, diaphoresis, fatigue and fever.  Eyes: Negative for visual disturbance.  Respiratory: Negative for cough and shortness of breath.   Cardiovascular: Negative for chest pain, palpitations and leg swelling.  Gastrointestinal: Negative for abdominal pain, constipation, diarrhea, nausea and vomiting.  Endocrine: Negative for cold intolerance, heat intolerance, polydipsia, polyphagia and polyuria.  Musculoskeletal: Positive for arthralgias.  Neurological: Negative for dizziness, tremors, seizures, syncope, facial asymmetry, speech difficulty, weakness, light-headedness, numbness and headaches.  Psychiatric/Behavioral: Negative for self-injury, sleep disturbance and suicidal ideas. The patient is nervous/anxious.     Past Medical History:  Diagnosis Date  . Allergy   . Anal fissure   . Anxiety   . Arthritis   . Blood clot in vein    left leg  . Detached retina    BOTH SCLERA BUCKLE BOTH EYES  . DVT (deep venous thrombosis) (HCC)    left leg  . Endometriosis   . Family history of adverse reaction to anesthesia    DAUGHTER POST OP PONV  .  GERD (gastroesophageal reflux disease)   . Glaucoma    RIGHT WORST THAN LEFT  . Heart murmur   . History of hemorrhoids   . Hypertension   . IBS (irritable bowel  syndrome)   . Perforated eardrum, right    SMALL HOLE  . PONV (postoperative nausea and vomiting)   . Pulmonary embolism ([place]) 6 YRS AGO   Past Surgical History:  Procedure Laterality Date  . ABDOMINAL HYSTERECTOMY    . CATARACT EXTRACTION Bilateral 2015  . CYSTOSCOPY W/ URETERAL STENT PLACEMENT Right 05/23/2017   Procedure: CYSTOSCOPY WITH RETROGRADE PYELOGRAM/URETERAL RIGHT STENT PLACEMENT;  Surgeon: Bjorn Loser, MD;  Location: [facility];  Service: Urology;  Laterality: Right;  . CYSTOSCOPY W/ URETERAL STENT PLACEMENT Right 06/04/2017   Procedure: CYSTOSCOPY WITHRIGHT RETROGRADE PYELOGRAMRIGHT /URETEREOSCOPY  STENT PLACEMENT;  Surgeon: Lucas Mallow, MD;  Location: [facility];  Service: Urology;  Laterality: Right;  . ENDOMETRIAL ABLATION    . [place]  2011  . INCONTINENCE SURGERY    . INNER EAR SURGERY     X 2  . RECTAL PROLAPSE REPAIR    . RETINAL DETACHMENT SURGERY Bilateral 2000  . RETINAL DETACHMENT SURGERY Bilateral   . TUBAL LIGATION    . VEIN SURGERY Left 04/2010   Allergies  Allergen Reactions  . Dextromethorphan Polistirex Er     Pt states caused "a lump" in her throat, making it difficult to swallow  . Hydrocodone Nausea And Vomiting  . Oxycodone-Acetaminophen Other (See Comments)    Made fingers tingle and go numb, started "feeling weird".   . Penicillins     Unknown: as child   Current Outpatient Medications on File Prior to Visit  Medication Sig Dispense Refill  . acetaminophen (TYLENOL) 500 MG tablet Take 500 mg by mouth 2 (two) times daily.    Marland Kitchen acyclovir (ZOVIRAX) 400 MG tablet Take 400 mg by mouth daily.     . Ascorbic Acid (VITAMIN C) 1000 MG tablet Take 1,000 mg by mouth daily.    . Cholecalciferol (VITAMIN D) 2000 UNITS tablet Take 2,000 Units by mouth daily.    . diclofenac sodium (VOLTAREN) 1 % GEL Apply 2 g topically 4 (four) times daily. (Patient not taking: Reported on 05/23/2017) 100 g 3  . Magnesium 500 MG CAPS  Take 500 mg by mouth daily.     . Nitroglycerin (RECTIV) 0.4 % OINT Place 0.4 inches rectally 2 (two) times daily. Apply 1 in of Rectiv intra-anally x 3 weeks. 1 Tube 3  . pantoprazole (PROTONIX) 40 MG tablet Take 1 tablet (40 mg total) by mouth daily. 90 tablet 3  . Polyethylene Glycol 3350 (MIRALAX PO) Take 17 g by mouth daily.     . ranitidine (ZANTAC) 150 MG tablet TAKE 2 TABLETS IN THE MORNING AS NEEDED FOR REFLUX 180 tablet 1  . Travoprost, BAK Free, (TRAVATAN Z) 0.004 % SOLN ophthalmic solution Place 1 drop into both eyes daily.     . vitamin B-12 (CYANOCOBALAMIN) 1000 MCG tablet Take 1,000 mcg by mouth daily.     No current facility-administered medications on file prior to visit.    Social History   Socioeconomic History  . Marital status: Divorced    Spouse name: Not on file  . Number of children: 1  . Years of education: Not on file  . Highest education level: Not on file  Social Needs  . Financial resource strain: Not on file  . Food insecurity - worry: Not on  file  . Food insecurity - inability: Not on file  . Transportation needs - medical: Not on file  . Transportation needs - non-medical: Not on file  Occupational History  . Occupation: Restaurant manager, fast food: [employer]  Tobacco Use  . Smoking status: Never Smoker  . Smokeless tobacco: Never Used  Substance and Sexual Activity  . Alcohol use: Yes    Frequency: Never    Comment: occ  . Drug use: No    Comment: former maryjuana use- quit in 1995  . Sexual activity: Yes  Other Topics Concern  . Not on file  Social History Narrative   Marital status: divorced x 2; dating seriously x 3 years.  Boyfriend with HIV.       Children:  1 daughter (12); no grandchildren      Lives:  With boyfriend.        Employment: works at [employer]      Tobacco: none      Alcohol: rarely; socially      Exercise: none; has treadmill and elliptical and bikes   Family History  Problem Relation Age of Onset  .  Emphysema Mother        smoker  . Allergies Mother   . Asthma Mother   . Heart disease Mother        AMI as cause of death  . COPD Mother   . Asthma Father   . Heart disease Father        AMI as cause of death  . Diabetes Father   . Asthma Brother   . Heart disease Brother 33       heart failure  . Lung cancer Maternal Grandfather        was a smoker  . Cancer Maternal Grandfather        lung  . Heart disease Paternal Grandmother   . Heart disease Paternal Grandfather   . Colon cancer Neg Hx        Objective:    BP 126/70 (BP Location: Left Arm, Patient Position: Sitting, Cuff Size: Normal)   Pulse 73   Temp 98.3 F (36.8 C) (Oral)   Resp 18   Ht 5' 7.84" (1.723 m)   Wt 164 lb 9.6 oz (74.7 kg)   LMP 10/11/2010   SpO2 99%   BMI 25.15 kg/m  Physical Exam  Constitutional: She is oriented to person, place, and time. She appears well-developed and well-nourished. No distress.  HENT:  Head: Normocephalic and atraumatic.  Right Ear: External ear normal.  Left Ear: External ear normal.  Nose: Nose normal.  Mouth/Throat: Oropharynx is clear and moist.  Eyes: Conjunctivae and EOM are normal. Pupils are equal, round, and reactive to light.  Neck: Normal range of motion. Neck supple. Carotid bruit is not present. No thyromegaly present.  Cardiovascular: Normal rate, regular rhythm, normal heart sounds and intact distal pulses. Exam reveals no gallop and no friction rub.  No murmur heard. Pulmonary/Chest: Effort normal and breath sounds normal. She has no wheezes. She has no rales.  Abdominal: Soft. Bowel sounds are normal. She exhibits no distension and no mass. There is no tenderness. There is no rebound and no guarding.  Lymphadenopathy:    She has no cervical adenopathy.  Neurological: She is alert and oriented to person, place, and time. No cranial nerve deficit.  Skin: Skin is warm and dry. No rash noted. She is not diaphoretic. No erythema. No pallor.  Psychiatric:  She has a normal mood and affect. Her behavior is normal.   No results found. Depression screen [facility] 2/9 06/30/2017 11/08/2016 02/04/2016 10/21/2015 01/13/2015  Decreased Interest 0 0 0 0 0  Down, Depressed, Hopeless 0 0 0 0 0  PHQ - 2 Score 0 0 0 0 0   Fall Risk  06/30/2017 11/08/2016 02/04/2016 10/21/2015  Falls in the past year? No No No No        Assessment & Plan:   1. Essential hypertension   2. Other meniscus derangements, posterior horn of medial meniscus, left knee   3. Ureteral stone   4. GAD (generalized anxiety disorder)   5. Hyperglycemia   6. Chronic bilateral low back pain without sciatica   7. History of sepsis   8. Tremor    Hypertension well controlled.  Recent labs reviewed during recent hospitalization.  Refills of Bystolic provided.  New onset meniscus tear of left knee.  Will be undergoing surgery in the upcoming few months with Dr. Ninfa Linden.  Recent ureteral stone requiring ureteral stenting and admitted for urosepsis.  Followed closely by urology.  Much improved.  Anxiety disorder has improved in the past year.  Recommend weaning clonazepam slowly over the upcoming year.  Decrease usage to 1 tablet daily as needed.  Plan to decrease further at next visit.  Recommend exercise for stress management.  New onset hyperglycemia.  Repeat glucose and hemoglobin A1c at visit today.  Suffers with chronic lower back pain.  Refill of Celebrex provided  Chronic tremor well controlled on primidone therapy.  Orders Placed This Encounter  Procedures  . POCT glucose (manual entry)  . POCT glycosylated hemoglobin (Hb A1C)   Meds ordered this encounter  Medications  . nebivolol (BYSTOLIC) 5 MG tablet    Sig: Take 1 tablet (5 mg total) by mouth daily.    Dispense:  90 tablet    Refill:  1  . ondansetron (ZOFRAN) 8 MG tablet    Sig: Take 1 tablet (8 mg total) by mouth every 8 (eight) hours as needed for nausea or vomiting.    Dispense:  20 tablet    Refill:  0  .  primidone (MYSOLINE) 50 MG tablet    Sig: TAKE 2 TABLETS (100 MG TOTAL) BY MOUTH AT BEDTIME.    Dispense:  180 tablet    Refill:  1  . celecoxib (CELEBREX) 200 MG capsule    Sig: Take 1 capsule (200 mg total) by mouth daily.    Dispense:  90 capsule    Refill:  1  . clonazePAM (KLONOPIN) 0.5 MG tablet    Sig: Take 1 tablet (0.5 mg total) by mouth at bedtime.    Dispense:  30 tablet    Refill:  5    This request is for a new prescription for a controlled substance as required by Federal/State law.  . EPINEPHrine 0.3 mg/0.3 mL IJ SOAJ injection    Sig: Inject 0.3 mLs (0.3 mg total) into the muscle once for 1 dose.    Dispense:  1 Device    Refill:  1    Return in about 6 months (around 12/28/2017) for complete physical examiniation.   Jasara Corrigan Elayne Guerin, M.D. Primary Care at [facility] [address] [place], [place]  [address] [phone] phone [phone] fax

## 2017-07-10 DIAGNOSIS — R251 Tremor, unspecified: Secondary | ICD-10-CM | POA: Insufficient documentation

## 2017-07-11 ENCOUNTER — Telehealth: Payer: Self-pay

## 2017-07-11 NOTE — Telephone Encounter (Signed)
Per Dr. Tamala Julian I called patient and inquired if she had ever been on Metoprolol.  Patient stated that she had not, but did not wish to be taken off Bystolic.  She stated that if it was due to the cost, she was willing to pay more to stay on Bystolic.  I told her I would let Dr. Tamala Julian know and she was very appreciative. Original letter placed back in Dr. Thompson Caul box.

## 2017-07-13 ENCOUNTER — Telehealth: Payer: Self-pay | Admitting: Adult Health

## 2017-07-13 NOTE — Telephone Encounter (Signed)
Call patient-- apparently we have received a prior authorization request from insurance.  Insurance is no longer willing to pay for Bystolic.  If patient refuses a trial of metoprolol, she will likely need to pay cash for her Bystolic.  Patient will need to advise her pharmacist  that she will pay cash for Bystolic.

## 2017-07-13 NOTE — Telephone Encounter (Signed)
Left message for patient. Patient needs to contact her PCP regarding this medication.

## 2017-07-13 NOTE — Telephone Encounter (Signed)
Call to patient.  Advised patient of Dr. Thompson Caul message.  Patient will call and speak to the insurance company herself and see if she can get the medication approved. I gave her my direct number so she could call me back with the information.

## 2017-07-15 ENCOUNTER — Other Ambulatory Visit: Payer: Self-pay | Admitting: Family Medicine

## 2017-07-15 DIAGNOSIS — F411 Generalized anxiety disorder: Secondary | ICD-10-CM

## 2017-07-16 ENCOUNTER — Telehealth: Payer: Self-pay | Admitting: Family Medicine

## 2017-07-16 NOTE — Telephone Encounter (Signed)
lmtcb for pt.  

## 2017-07-16 NOTE — Telephone Encounter (Signed)
Copied from [place] [id number]. Topic: General - Other >> Jul 16, 2017  8:28 AM Darl Householder, RMA wrote: Reason for CRM: Medication refill request for Clonazepam 0.5 mg to be sent to [facility], pt is completely  out of medication

## 2017-07-17 ENCOUNTER — Telehealth (INDEPENDENT_AMBULATORY_CARE_PROVIDER_SITE_OTHER): Payer: Self-pay

## 2017-07-17 ENCOUNTER — Telehealth: Payer: Self-pay | Admitting: Adult Health

## 2017-07-17 NOTE — Telephone Encounter (Signed)
Scheduled patient for knee scope on 07/26/17.  She needs to know how long she will be out of work.  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  She is also interested in post op P.T.  Please call [phone] or [phone].

## 2017-07-17 NOTE — Telephone Encounter (Signed)
Patient aware about 2-4 weeks

## 2017-07-17 NOTE — Telephone Encounter (Signed)
Called pt letting her know that we could not do anything regarding the bystolic since we have not seen her since 2016.  Pt stated to me she was needing information from the ov where MW put her on the bystolic so she can give it to her PCP as proof why she needs to remain on that med.  Faxed ov from 10/11/10 to pt for her to give to her pcp as to why she needs to be on that bp med.  Nothing further needed at this current time.

## 2017-07-17 NOTE — Telephone Encounter (Signed)
ATC pt, no answer. Left message for pt to call back.  Per triage protocol will close message. Pt contacted three times.

## 2017-07-17 NOTE — Telephone Encounter (Signed)
Confirmed with pharmacy prescription is there and ready to be refilled.   Dr. Tamala Julian,  Patient is faxing over records for way she has to be put on Bystolic and was taken off of Lisinopril by her previous doctor.

## 2017-07-18 NOTE — Telephone Encounter (Signed)
Please call patient.  I am happy to review records to why she was taken off of lisinopril and placed on Bystolic; however, lisinopril is not in the same class of medication as Bystolic.  This will not be sufficient for insurance to cover Bystolic.  Her insurance is requesting that she try a similar medicine to [facility] such as metoprolol or atenolol or Carvedilol.

## 2017-07-18 NOTE — Telephone Encounter (Signed)
Spoke with pt about her mediation change and she states the Bystolic works best for her and would prefer to stay on this medication because of her breathing complications.  Please advise

## 2017-07-25 ENCOUNTER — Other Ambulatory Visit: Payer: Self-pay | Admitting: Obstetrics and Gynecology

## 2017-07-25 DIAGNOSIS — N6489 Other specified disorders of breast: Secondary | ICD-10-CM

## 2017-07-26 ENCOUNTER — Encounter: Payer: Self-pay | Admitting: Orthopaedic Surgery

## 2017-07-26 DIAGNOSIS — M23322 Other meniscus derangements, posterior horn of medial meniscus, left knee: Secondary | ICD-10-CM | POA: Diagnosis not present

## 2017-07-27 ENCOUNTER — Telehealth (INDEPENDENT_AMBULATORY_CARE_PROVIDER_SITE_OTHER): Payer: Self-pay | Admitting: Orthopaedic Surgery

## 2017-07-27 NOTE — Telephone Encounter (Signed)
Patient called wanting to know if Dr. Ninfa Linden wants her to stay out of work through Wednesday and return to work on Thursday.  She is already scheduled to be out of work through Tuesday.  CB#[phone].  Thank you.

## 2017-07-27 NOTE — Telephone Encounter (Signed)
Patient aware she may stay out until Thursday

## 2017-07-30 ENCOUNTER — Telehealth (INDEPENDENT_AMBULATORY_CARE_PROVIDER_SITE_OTHER): Payer: Self-pay | Admitting: Orthopaedic Surgery

## 2017-07-30 NOTE — Telephone Encounter (Signed)
I want her straightening and bending her knee is much as possible and work on getting things moving as comfort allows.

## 2017-07-30 NOTE — Telephone Encounter (Signed)
Please advise 

## 2017-07-30 NOTE — Telephone Encounter (Signed)
LMOM for patient of the below message from Dr. Blackman  

## 2017-07-30 NOTE — Telephone Encounter (Signed)
Patient recently had surgery and she doesn't know what she is supposed to be doing as far as straightening it out or keeping it bent. She said it feels stiff and since her follow up is another week away she would like a call back to advise her. # [phone]

## 2017-07-31 ENCOUNTER — Other Ambulatory Visit: Payer: PRIVATE HEALTH INSURANCE

## 2017-08-03 ENCOUNTER — Telehealth (INDEPENDENT_AMBULATORY_CARE_PROVIDER_SITE_OTHER): Payer: Self-pay | Admitting: Orthopaedic Surgery

## 2017-08-03 NOTE — Telephone Encounter (Signed)
Patient needs a work note stating that its okay for her to go back to work, she was told by Dr. Ninfa Linden to go back when she felt comfortable enough to. She went back today (2/15) and her HR needed to make sure it was ok for her to be back. She would like to pick it up Monday at her appointment. She also is feeling some discomfort and has some questions if it possible to call her before her appt. # [phone]

## 2017-08-03 NOTE — Telephone Encounter (Signed)
Patient called stating that her employer is needing a work release note faxed to them at [phone] and also she would like a copy faxed to herself at [phone].  Thank you.

## 2017-08-06 ENCOUNTER — Other Ambulatory Visit (INDEPENDENT_AMBULATORY_CARE_PROVIDER_SITE_OTHER): Payer: Self-pay

## 2017-08-06 ENCOUNTER — Telehealth (INDEPENDENT_AMBULATORY_CARE_PROVIDER_SITE_OTHER): Payer: Self-pay

## 2017-08-06 ENCOUNTER — Encounter (INDEPENDENT_AMBULATORY_CARE_PROVIDER_SITE_OTHER): Payer: Self-pay | Admitting: Orthopaedic Surgery

## 2017-08-06 ENCOUNTER — Ambulatory Visit (INDEPENDENT_AMBULATORY_CARE_PROVIDER_SITE_OTHER): Payer: PRIVATE HEALTH INSURANCE | Admitting: Orthopaedic Surgery

## 2017-08-06 DIAGNOSIS — Z9889 Other specified postprocedural states: Secondary | ICD-10-CM

## 2017-08-06 DIAGNOSIS — M1712 Unilateral primary osteoarthritis, left knee: Secondary | ICD-10-CM | POA: Diagnosis not present

## 2017-08-06 DIAGNOSIS — Z96652 Presence of left artificial knee joint: Secondary | ICD-10-CM

## 2017-08-06 HISTORY — DX: Other specified postprocedural states: Z98.890

## 2017-08-06 MED ORDER — LIDOCAINE HCL 1 % IJ SOLN
3.0000 mL | INTRAMUSCULAR | Status: AC | PRN
  Administered 2017-08-06: 3 mL

## 2017-08-06 MED ORDER — METHYLPREDNISOLONE ACETATE 40 MG/ML IJ SUSP
40.0000 mg | INTRAMUSCULAR | Status: AC | PRN
  Administered 2017-08-06: 40 mg via INTRA_ARTICULAR

## 2017-08-06 NOTE — Telephone Encounter (Signed)
Submitted [employer] application online for left knee injection.

## 2017-08-06 NOTE — Telephone Encounter (Signed)
FYI---she is coming in today, I guess we can give her this then

## 2017-08-06 NOTE — Progress Notes (Signed)
Office Visit Note   Patient: Sharon [name]           Date of Birth: 14-Aug-1965           MRN: [id number] Visit Date: 08/06/2017              Requested by: Wardell Honour, MD [address] [place], [place] [address] PCP: Wardell Honour, MD   Assessment & Plan: Visit Diagnoses:  1. Status post arthroscopy of left knee     Plan: At this point I do feel that she would benefit from a steroid injection in her knee and she agreed with this.  She is also the perfect candidate for hyaluronic acid to treat her moderate arthritis.  We will also send her for physical therapy today bending and moving better as well.  All questions concerns were answered and addressed.  We will see her back in 4 weeks hopefully will have her approved for hyaluronic acid for her left knee and she will then do some physical therapy as well.  Follow-Up Instructions: Return in about 4 weeks (around 09/03/2017).   Orders:  No orders of the defined types were placed in this encounter.  No orders of the defined types were placed in this encounter.     Procedures: Large Joint Inj: L knee on 08/06/2017 3:12 PM Indications: diagnostic evaluation and pain Details: 22 G 1.5 in needle, superolateral approach  Arthrogram: No  Medications: 3 mL lidocaine 1 %; 40 mg methylPREDNISolone acetate 40 MG/ML Outcome: tolerated well, no immediate complications Procedure, treatment alternatives, risks and benefits explained, specific risks discussed. Consent was given by the patient. Immediately prior to procedure a time out was called to verify the correct patient, procedure, equipment, support staff and site/side marked as required. Patient was prepped and draped in the usual sterile fashion.       Clinical Data: No additional findings.   Subjective: No chief complaint on file. The patient is status post a left knee arthroscopy with a partial medial meniscectomy.  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  She is complaining of the knee being swollen and difficulty mobilizing it knee bending because of the swelling.  HPI  Review of Systems She currently denies any  fever and chills.  Objective: Vital Signs: LMP 10/11/2010   Physical Exam She is alert and oriented x3 and in no acute distress.  She is walking with a limp.  Examination of her left knee does show moderate effusion.  I was able to aspirate at least 35 cc of bloody fluid from the knee.  The knee feels ligamentously stable. Ortho Exam  Specialty Comments:  No specialty comments available.  Imaging: No results found.   PMFS History: Patient Active Problem List   Diagnosis Date Noted  . Status post arthroscopy of left knee 08/06/2017  . Tremor 07/10/2017  . Other meniscus derangements, posterior horn of medial meniscus, left knee 06/28/2017  . Ureteral stone 05/23/2017  . History of sepsis 05/23/2017  . Acute pain of left knee 04/09/2017  . Trochanteric bursitis, right hip 09/27/2016  . GAD (generalized anxiety disorder) 05/31/2014  . Rectocele 08/21/2011  . Uterovaginal prolapse 08/21/2011  . Anal fissure 05/05/2011  . Dyspnea 10/13/2010  . Hypertension 10/13/2010   Past Medical History:  Diagnosis Date  . Allergy   . Anal fissure   . Anxiety   . Arthritis   . Blood clot in vein    left  leg  . Detached retina    BOTH SCLERA BUCKLE BOTH EYES  . DVT (deep venous thrombosis) (HCC)    left leg  . Endometriosis   . Family history of adverse reaction to anesthesia    DAUGHTER POST OP PONV  . GERD (gastroesophageal reflux disease)   . Glaucoma    RIGHT WORST THAN LEFT  . Heart murmur   . History of hemorrhoids   . Hypertension   . IBS (irritable bowel syndrome)   . Perforated eardrum, right    SMALL HOLE  . PONV (postoperative nausea and vomiting)   . Pulmonary embolism ([place]) 6 YRS AGO    Family History  Problem Relation Age of Onset  . Emphysema Mother        smoker  .  Allergies Mother   . Asthma Mother   . Heart disease Mother        AMI as cause of death  . COPD Mother   . Asthma Father   . Heart disease Father        AMI as cause of death  . Diabetes Father   . Asthma Brother   . Heart disease Brother 85       heart failure  . Lung cancer Maternal Grandfather        was a smoker  . Cancer Maternal Grandfather        lung  . Heart disease Paternal Grandmother   . Heart disease Paternal Grandfather   . Colon cancer Neg Hx     Past Surgical History:  Procedure Laterality Date  . ABDOMINAL HYSTERECTOMY    . CATARACT EXTRACTION Bilateral 2015  . CYSTOSCOPY W/ URETERAL STENT PLACEMENT Right 05/23/2017   Procedure: CYSTOSCOPY WITH RETROGRADE PYELOGRAM/URETERAL RIGHT STENT PLACEMENT;  Surgeon: Bjorn Loser, MD;  Location: [facility];  Service: Urology;  Laterality: Right;  . CYSTOSCOPY W/ URETERAL STENT PLACEMENT Right 06/04/2017   Procedure: CYSTOSCOPY WITHRIGHT RETROGRADE PYELOGRAMRIGHT /URETEREOSCOPY  STENT PLACEMENT;  Surgeon: Lucas Mallow, MD;  Location: [facility];  Service: Urology;  Laterality: Right;  . ENDOMETRIAL ABLATION    . [place]  2011  . INCONTINENCE SURGERY    . INNER EAR SURGERY     X 2  . RECTAL PROLAPSE REPAIR    . RETINAL DETACHMENT SURGERY Bilateral 2000  . RETINAL DETACHMENT SURGERY Bilateral   . TUBAL LIGATION    . VEIN SURGERY Left 04/2010   Social History   Occupational History  . Occupation: Restaurant manager, fast food: [employer]  Tobacco Use  . Smoking status: Never Smoker  . Smokeless tobacco: Never Used  Substance and Sexual Activity  . Alcohol use: Yes    Frequency: Never    Comment: occ  . Drug use: No    Comment: former maryjuana use- quit in 1995  . Sexual activity: Yes

## 2017-08-07 ENCOUNTER — Encounter (INDEPENDENT_AMBULATORY_CARE_PROVIDER_SITE_OTHER): Payer: Self-pay

## 2017-08-07 ENCOUNTER — Telehealth (INDEPENDENT_AMBULATORY_CARE_PROVIDER_SITE_OTHER): Payer: Self-pay | Admitting: Orthopaedic Surgery

## 2017-08-07 NOTE — Telephone Encounter (Signed)
Does she need any restrictions or anything

## 2017-08-07 NOTE — Telephone Encounter (Signed)
Patient called needing a note faxed to her employer with any restrictions for her going back to work. Patient advised she went back to work last Friday. Patient states she gave fax# for her employer. The number to contact patient is [phone]

## 2017-08-07 NOTE — Telephone Encounter (Signed)
Patient called advised she can not get into (PT) in the morning because there are no openings. Patient asked Dr. Ninfa Linden can fax some exercises to do? The fax# is [phone]   The ph# is [phone]

## 2017-08-07 NOTE — Telephone Encounter (Signed)
See Below:

## 2017-08-07 NOTE — Telephone Encounter (Signed)
Faxed notes to provided numbers

## 2017-08-07 NOTE — Telephone Encounter (Signed)
No standing for long periods of time.

## 2017-08-08 ENCOUNTER — Other Ambulatory Visit: Payer: Self-pay

## 2017-08-08 NOTE — Telephone Encounter (Signed)
Nothing really to send her.  Tell her that quad strengthening exercises such as straight leg raises are helpful as well as lunges.  She can look on the internet under [employer] and type in "quad strengthening exercises" and then go to images and videos and will see a lot of exercises to try.

## 2017-08-08 NOTE — Telephone Encounter (Signed)
Fax request from [employer] via employer [employer].  Patient does not want to change from Bystolic. [facility] requests change to Metoprolol 50mg  1 tablet daily.  Form faxed back with infor that patient does not want to change from bystolic.  Patient aware additional cost may be incurred.  Dr. Tamala Julian recommends change to metoprolol  Discussed with Dr. Tamala Julian. Letter sent explaining above to patient.

## 2017-08-08 NOTE — Telephone Encounter (Signed)
Faxed Rx for PT to provided number

## 2017-08-09 ENCOUNTER — Telehealth (INDEPENDENT_AMBULATORY_CARE_PROVIDER_SITE_OTHER): Payer: Self-pay | Admitting: Orthopaedic Surgery

## 2017-08-09 NOTE — Telephone Encounter (Signed)
This is normal given the extent of her surgery and the cartilage loss we found on that medial side of her knee.  She just needs to give it more time for things to hopefully improve.

## 2017-08-09 NOTE — Telephone Encounter (Signed)
Patient aware of the below message  

## 2017-08-09 NOTE — Telephone Encounter (Signed)
Patient called stating feel the exact same way she did prior to surgery. Not sure if this is normal. Wants someone to give her a call to discuss the pain she is having. Surgery was 2 weeks ago and came in for OV.  Please call patient to advise

## 2017-08-14 ENCOUNTER — Telehealth (INDEPENDENT_AMBULATORY_CARE_PROVIDER_SITE_OTHER): Payer: Self-pay

## 2017-08-14 NOTE — Telephone Encounter (Signed)
Faxed copy of insurance card to [employer] program at [phone].

## 2017-08-17 ENCOUNTER — Emergency Department (HOSPITAL_COMMUNITY): Payer: PRIVATE HEALTH INSURANCE

## 2017-08-17 ENCOUNTER — Encounter (HOSPITAL_COMMUNITY): Payer: Self-pay

## 2017-08-17 ENCOUNTER — Emergency Department (HOSPITAL_COMMUNITY)
Admission: EM | Admit: 2017-08-17 | Discharge: 2017-08-17 | Disposition: A | Discharge status: Home / Self Care | Payer: PRIVATE HEALTH INSURANCE | Attending: Emergency Medicine | Admitting: Emergency Medicine

## 2017-08-17 ENCOUNTER — Other Ambulatory Visit: Payer: Self-pay

## 2017-08-17 ENCOUNTER — Ambulatory Visit: Payer: Self-pay | Admitting: *Deleted

## 2017-08-17 DIAGNOSIS — R0602 Shortness of breath: Secondary | ICD-10-CM | POA: Insufficient documentation

## 2017-08-17 DIAGNOSIS — R002 Palpitations: Secondary | ICD-10-CM | POA: Diagnosis present

## 2017-08-17 DIAGNOSIS — R0789 Other chest pain: Secondary | ICD-10-CM | POA: Insufficient documentation

## 2017-08-17 DIAGNOSIS — Z86718 Personal history of other venous thrombosis and embolism: Secondary | ICD-10-CM | POA: Insufficient documentation

## 2017-08-17 DIAGNOSIS — Z79899 Other long term (current) drug therapy: Secondary | ICD-10-CM | POA: Diagnosis not present

## 2017-08-17 LAB — CBC
HCT: 36.9 % (ref 36.0–46.0)
Hemoglobin: 12.4 g/dL (ref 12.0–15.0)
MCH: 31.2 pg (ref 26.0–34.0)
MCHC: 33.6 g/dL (ref 30.0–36.0)
MCV: 92.9 fL (ref 78.0–100.0)
Platelets: 328 10*3/uL (ref 150–400)
RBC: 3.97 MIL/uL (ref 3.87–5.11)
RDW: 13.4 % (ref 11.5–15.5)
WBC: 7.5 10*3/uL (ref 4.0–10.5)

## 2017-08-17 LAB — BASIC METABOLIC PANEL
Anion gap: 9 (ref 5–15)
BUN: 22 mg/dL — ABNORMAL HIGH (ref 6–20)
CO2: 27 mmol/L (ref 22–32)
Calcium: 9.8 mg/dL (ref 8.9–10.3)
Chloride: 105 mmol/L (ref 101–111)
Creatinine, Ser: 0.65 mg/dL (ref 0.44–1.00)
GFR calc Af Amer: >60 mL/min (ref >60)
GFR calc non Af Amer: >60 mL/min (ref >60)
Glucose, Bld: 101 mg/dL — ABNORMAL HIGH (ref 65–99)
Potassium: 3.8 mmol/L (ref 3.5–5.1)
Sodium: 141 mmol/L (ref 135–145)

## 2017-08-17 LAB — I-STAT TROPONIN, ED: Troponin i, poc: 0 ng/mL (ref 0.00–0.08)

## 2017-08-17 LAB — D-DIMER, QUANTITATIVE (NOT AT ARMC): D DIMER QUANT: 0.28 ug{FEU}/mL (ref 0.00–0.50)

## 2017-08-17 NOTE — ED Provider Notes (Signed)
[facility] DEPT Provider Note   CSN: [id number] Arrival date & time: 08/17/17  1139     History   Chief Complaint Chief Complaint  Patient presents with  . Irregular Heart Beat  . Chest Pain    HPI Sharon [name] is a 52 y.o. female w  PMHx PE & DVT, GERD, HTN, IBS, presenting to the ED with 4 days of intermittent heart palpitations.  Patient states she feels as though her heart beats fast for a few beats then feels like skips a beats.  States she feels mildly short of breath during these episodes they last only a few seconds.  She reports a generalized persistent chest tightness as well.  Palpitations are intermittently improved with deep breaths and worse when laying flat.  Of note, patient reports she is 3 weeks postop left meniscal repair.  States she is not on anticoagulation. Denies LE pain or swelling that new since surgery. No cardiac hx; normal stress test about 7 years ago. Only new medication is Oxycodone started after surgery.  The history is provided by the patient.    Past Medical History:  Diagnosis Date  . Allergy   . Anal fissure   . Anxiety   . Arthritis   . Blood clot in vein    left leg  . Detached retina    BOTH SCLERA BUCKLE BOTH EYES  . DVT (deep venous thrombosis) (HCC)    left leg  . Endometriosis   . Family history of adverse reaction to anesthesia    DAUGHTER POST OP PONV  . GERD (gastroesophageal reflux disease)   . Glaucoma    RIGHT WORST THAN LEFT  . Heart murmur   . History of hemorrhoids   . Hypertension   . IBS (irritable bowel syndrome)   . Perforated eardrum, right    SMALL HOLE  . PONV (postoperative nausea and vomiting)   . Pulmonary embolism ([place]) 6 YRS AGO    Patient Active Problem List   Diagnosis Date Noted  . Status post arthroscopy of left knee 08/06/2017  . Unilateral primary osteoarthritis, left knee 08/06/2017  . Tremor 07/10/2017  . Other meniscus derangements, posterior horn of  medial meniscus, left knee 06/28/2017  . Ureteral stone 05/23/2017  . History of sepsis 05/23/2017  . Acute pain of left knee 04/09/2017  . Trochanteric bursitis, right hip 09/27/2016  . GAD (generalized anxiety disorder) 05/31/2014  . Rectocele 08/21/2011  . Uterovaginal prolapse 08/21/2011  . Anal fissure 05/05/2011  . Dyspnea 10/13/2010  . Hypertension 10/13/2010    Past Surgical History:  Procedure Laterality Date  . ABDOMINAL HYSTERECTOMY    . CATARACT EXTRACTION Bilateral 2015  . CYSTOSCOPY W/ URETERAL STENT PLACEMENT Right 05/23/2017   Procedure: CYSTOSCOPY WITH RETROGRADE PYELOGRAM/URETERAL RIGHT STENT PLACEMENT;  Surgeon: Bjorn Loser, MD;  Location: [facility];  Service: Urology;  Laterality: Right;  . CYSTOSCOPY W/ URETERAL STENT PLACEMENT Right 06/04/2017   Procedure: CYSTOSCOPY WITHRIGHT RETROGRADE PYELOGRAMRIGHT /URETEREOSCOPY  STENT PLACEMENT;  Surgeon: Lucas Mallow, MD;  Location: [facility];  Service: Urology;  Laterality: Right;  . ENDOMETRIAL ABLATION    . [place]  2011  . INCONTINENCE SURGERY    . INNER EAR SURGERY     X 2  . KNEE SURGERY    . RECTAL PROLAPSE REPAIR    . RETINAL DETACHMENT SURGERY Bilateral 2000  . RETINAL DETACHMENT SURGERY Bilateral   . TUBAL LIGATION    . VEIN SURGERY Left  04/2010    OB History    No data available       Home Medications    Prior to Admission medications   Medication Sig Start Date End Date Taking? Authorizing Provider  acetaminophen (TYLENOL) 500 MG tablet Take 500 mg by mouth 2 (two) times daily.   Yes [provider]  acyclovir (ZOVIRAX) 400 MG tablet Take 400 mg by mouth at bedtime.  07/04/10  Yes [provider]  Ascorbic Acid (VITAMIN C) 1000 MG tablet Take 1,000 mg by mouth daily.   Yes [provider]  celecoxib (CELEBREX) 200 MG capsule Take 1 capsule (200 mg total) by mouth daily. Patient taking differently: Take 200 mg by mouth daily as needed  (For pain.).  06/30/17  Yes Wardell Honour, MD  Cholecalciferol (VITAMIN D) 2000 UNITS tablet Take 2,000 Units by mouth daily.   Yes [provider]  clonazePAM (KLONOPIN) 0.5 MG tablet Take 1 tablet (0.5 mg total) by mouth at bedtime. 06/30/17  Yes Wardell Honour, MD  diclofenac sodium (VOLTAREN) 1 % GEL Apply 2 g topically 4 (four) times daily. Patient taking differently: Apply 2 g topically 4 (four) times daily as needed (For pain.).  08/21/16  Yes Mcarthur Rossetti, MD  EPINEPHrine 0.3 mg/0.3 mL IJ SOAJ injection Inject 0.3 mg into the muscle once as needed (For anaphylaxis.).  07/02/17  Yes [provider]  Magnesium 500 MG CAPS Take 500 mg by mouth daily.    Yes [provider]  nebivolol (BYSTOLIC) 5 MG tablet Take 1 tablet (5 mg total) by mouth daily. 06/30/17  Yes Wardell Honour, MD  Nitroglycerin (RECTIV) 0.4 % OINT Place 0.4 inches rectally 2 (two) times daily. Apply 1 in of Rectiv intra-anally x 3 weeks. Patient taking differently: Place 0.4 inches rectally 2 (two) times daily as needed (For anal fissures.).  05/06/12  Yes Stark Klein, MD  ondansetron (ZOFRAN) 8 MG tablet Take 1 tablet (8 mg total) by mouth every 8 (eight) hours as needed for nausea or vomiting. 06/30/17  Yes Wardell Honour, MD  oxyCODONE-acetaminophen (PERCOCET/ROXICET) 5-325 MG tablet Take 1-2 tablets by mouth every 4 (four) hours as needed (For pain.).  07/26/17  Yes [provider]  pantoprazole (PROTONIX) 40 MG tablet Take 1 tablet (40 mg total) by mouth daily. Patient taking differently: Take 40 mg by mouth at bedtime.  11/08/16  Yes Wardell Honour, MD  Polyethylene Glycol 3350 (MIRALAX PO) Take 17 g by mouth daily as needed (For constipation.).    Yes [provider]  primidone (MYSOLINE) 50 MG tablet TAKE 2 TABLETS (100 MG TOTAL) BY MOUTH AT BEDTIME. Patient taking differently: Take 100 mg by mouth daily.  06/30/17  Yes Wardell Honour, MD  ranitidine (ZANTAC) 150 MG  tablet TAKE 2 TABLETS IN THE MORNING AS NEEDED FOR REFLUX Patient taking differently: Take 300 mg by mouth daily.  02/17/17  Yes Wardell Honour, MD  Travoprost, BAK Free, (TRAVATAN Z) 0.004 % SOLN ophthalmic solution Place 1 drop into both eyes at bedtime.    Yes [provider]  vitamin B-12 (CYANOCOBALAMIN) 1000 MCG tablet Take 1,000 mcg by mouth daily.   Yes [provider]    Family History Family History  Problem Relation Age of Onset  . Emphysema Mother        smoker  . Allergies Mother   . Asthma Mother   . Heart disease Mother  AMI as cause of death  . COPD Mother   . Asthma Father   . Heart disease Father        AMI as cause of death  . Diabetes Father   . Asthma Brother   . Heart disease Brother 22       heart failure  . Lung cancer Maternal Grandfather        was a smoker  . Cancer Maternal Grandfather        lung  . Heart disease Paternal Grandmother   . Heart disease Paternal Grandfather   . Colon cancer Neg Hx     Social History Social History   Tobacco Use  . Smoking status: Never Smoker  . Smokeless tobacco: Never Used  Substance Use Topics  . Alcohol use: Yes    Frequency: Never    Comment: occ  . Drug use: No    Comment: former maryjuana use- quit in 1995     Allergies   Dextromethorphan polistirex er; Hydrocodone; Oxycodone-acetaminophen; Penicillins; and Tape   Review of Systems Review of Systems  Constitutional: Negative for diaphoresis and fever.  Respiratory: Positive for shortness of breath.   Cardiovascular: Positive for chest pain and palpitations. Negative for leg swelling.  Gastrointestinal: Negative for abdominal pain and nausea.  All other systems reviewed and are negative.    Physical Exam Updated Vital Signs BP 132/75   Pulse 75   Temp 98 F (36.7 C) (Oral)   Resp 18   Ht 5' 7.5" (1.715 m)   Wt 71.7 kg (158 lb)   LMP 10/11/2010   SpO2 100%   BMI 24.38 kg/m   Physical Exam    Constitutional: She is oriented to person, place, and time. She appears well-developed and well-nourished. No distress.  HENT:  Head: Normocephalic and atraumatic.  Mouth/Throat: Oropharynx is clear and moist.  Eyes: Conjunctivae are normal.  Neck: Normal range of motion. Neck supple. No JVD present. No tracheal deviation present.  Cardiovascular: Normal rate, normal heart sounds and intact distal pulses. Exam reveals no gallop and no friction rub.  No murmur heard. Pulmonary/Chest: Effort normal and breath sounds normal. No stridor. No respiratory distress. She has no wheezes. She has no rales.  Abdominal: Soft. Bowel sounds are normal. She exhibits no distension. There is no tenderness. There is no rebound and no guarding.  Musculoskeletal:  No unilateral leg swelling or erythema.  Mild TTP to medial left calf.  Neurological: She is alert and oriented to person, place, and time.  Skin: Skin is warm.  Psychiatric: She has a normal mood and affect. Her behavior is normal.  Nursing note and vitals reviewed.    ED Treatments / Results  Labs (all labs ordered are listed, but only abnormal results are displayed) Labs Reviewed  BASIC METABOLIC PANEL - Abnormal; Notable for the following components:      Result Value   Glucose, Bld 101 (*)    BUN 22 (*)    All other components within normal limits  CBC  D-DIMER, QUANTITATIVE (NOT AT [facility])  I-STAT TROPONIN, ED    EKG  EKG Interpretation  Date/Time:  Friday August 17 2017 11:52:08 EST Ventricular Rate:  68 PR Interval:    QRS Duration: 94 QT Interval:  372 QTC Calculation: 396 R Axis:   55 Text Interpretation:  Sinus rhythm Atrial premature complex Confirmed by Virgel Manifold [phone]) on 08/17/2017 12:27:11 PM       Radiology Dg Chest 2 View  Result Date:  08/17/2017 CLINICAL DATA:  Atrial fibrillation, LEFT chest tightness, very short of breath, tachycardia, coughing for 4 days, hypertension EXAM: CHEST  2 VIEW COMPARISON:   05/23/2017 FINDINGS: Normal heart size, mediastinal contours, and pulmonary vascularity. Minimal peribronchial thickening. Lungs clear. No pulmonary infiltrate, pleural effusion, or pneumothorax. Bones unremarkable. IMPRESSION: Minimal bronchitic changes without infiltrate. Electronically Signed   By: Lavonia Dana M.D.   On: 08/17/2017 12:42    Procedures Procedures (including critical care time)  Medications Ordered in ED Medications - No data to display   Initial Impression / Assessment and Plan / ED Course  I have reviewed the triage vital signs and the nursing notes.  Pertinent labs & imaging results that were available during my care of the patient were reviewed by me and considered in my medical decision making (see chart for details).     Patient presented to the ED for 4 days of intermittent palpitations.  Of note, patient is 3 weeks postop left and meniscal repair; with past medical history of PE and DVT not on anticoagulation.  EKG done in triage shows normal sinus rhythm.  Cardiac Monitor listing atrial fibrillation, however it appears patient is having frequent premature atrial contractions, fibrillation in the ED.  Chest x-ray negative.  Troponin negative.  CBC and BMP unremarkable.  D-dimer ordered to evaluate for possibility of DVT/PE given patient's history and risk factors.  D-dimer is negative.  Patient discussed with Dr. Wilson Singer.  Plan to discharge patient with cardiology referral.  Patient may benefit from Holter monitor. Pt is well-appearing, not distressed, safe for discharge at this time.  Discussed results, findings, treatment and follow up. Patient advised of return precautions. Patient verbalized understanding and agreed with plan.  Final Clinical Impressions(s) / ED Diagnoses   Final diagnoses:  Palpitations    ED Discharge Orders    None       Donshay Lupinski, [place] N, PA-C 08/17/17 1442    Virgel Manifold, MD 08/18/17 [phone]

## 2017-08-17 NOTE — ED Triage Notes (Addendum)
Patient reports that she has been having an irregular heart and feeling like it is racing x 4 days. Patient reports that it is worse when she lays down. Patient has had NSR/atrial fib in triage.

## 2017-08-17 NOTE — Telephone Encounter (Addendum)
Pt reports heart palpitation x 4 days, gradual increase in frequency and duration. 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  Pt reports chest tightness and moderate SOB when occurs. States "It's present 80% of the time, stops briefly then happens again.The patient is status post a left knee arthroscopy with a partial medial meniscectomy in January. H/O blood clots "in legs and lungs." Denies dizziness. States "Heart pounding so hard last night I could see it beating in my chest." States on klonopin for anxiety. Directed to ED. Instructed to have someone else drive.   Reason for Disposition . [1] Skipped or extra beat(s) AND [2] occurs 4 or more times per minute  Answer Assessment - Initial Assessment Questions 1. DESCRIPTION: "Please describe your heart rate or heart beat that you are having" (e.g., fast/slow, regular/irregular, skipped or extra beats, "palpitations")     Skipped beats, "racing at times" 2. ONSET: "When did it start?" (Minutes, hours or days)       4 days ago; worsening, more frequent 3. DURATION: "How long does it last" (e.g., seconds, minutes, hours)     "There  80% of the time. Sometimes worse than others."  4. PATTERN "Does it come and go, or has it been constant since it started?"  "Does it get worse with exertion?"   "Are you feeling it now?"     Not sure; yes 5. TAP: "Using your hand, can you tap out what you are feeling on a chair or table in front of you, so that I can hear?" (Note: not all patients can do this)        6. HEART RATE: "Can you tell me your heart rate?" "How many beats in 15 seconds?"  (Note: not all patients can do this)      86. Checked by RN where pt works. Stated "irregular."  7. RECURRENT SYMPTOM: "Have you ever had this before?" If so, ask: "When was the last time?" and "What happened that time?"      Once in a while over the years; anxiety related 8. CAUSE: "What do you think is causing the palpitations?"     Unsure 9. CARDIAC  HISTORY: "Do you have any history of heart disease?" (e.g., heart attack, angina, bypass surgery, angioplasty, arrhythmia)      Recent surgery, H/O blood clots lungs and legs 6 years ago. 10. OTHER SYMPTOMS: "Do you have any other symptoms?" (e.g., dizziness, chest pain, sweating, difficulty breathing)       SOB when beats fast; tremors, "Chest tightness when heart rate fast." 11. PREGNANCY: "Is there any chance you are pregnant?" "When was your last menstrual period?"       no  Protocols used: HEART RATE AND HEARTBEAT QUESTIONS-A-AH

## 2017-08-17 NOTE — Telephone Encounter (Deleted)
Clots in legs and lungs. Pt had recent TKR.

## 2017-08-17 NOTE — Discharge Instructions (Signed)
Please schedule an appointment with a cardiologist to follow-up on your visit today.  You may benefit from wearing a cardiac monitor for a short time, and a cardiologist can determine if this is necessary. Return to the ER for new or concerning symptoms.

## 2017-08-21 ENCOUNTER — Other Ambulatory Visit: Payer: Self-pay | Admitting: Family Medicine

## 2017-08-21 DIAGNOSIS — K219 Gastro-esophageal reflux disease without esophagitis: Secondary | ICD-10-CM

## 2017-08-21 NOTE — Telephone Encounter (Signed)
Ranitidine refill request  LR: 02/17/17 #180 tablet 1 RF LOV: 06/30/17 PCP: Reginia Forts MD Pharmacy: [facility]

## 2017-09-10 ENCOUNTER — Encounter (INDEPENDENT_AMBULATORY_CARE_PROVIDER_SITE_OTHER): Payer: Self-pay | Admitting: Orthopaedic Surgery

## 2017-09-10 ENCOUNTER — Ambulatory Visit (INDEPENDENT_AMBULATORY_CARE_PROVIDER_SITE_OTHER): Payer: PRIVATE HEALTH INSURANCE | Admitting: Orthopaedic Surgery

## 2017-09-10 DIAGNOSIS — M1712 Unilateral primary osteoarthritis, left knee: Secondary | ICD-10-CM

## 2017-09-10 MED ORDER — HYALURONAN 88 MG/4ML IX SOSY
88.0000 mg | PREFILLED_SYRINGE | INTRA_ARTICULAR | Status: AC | PRN
  Administered 2017-09-10: 88 mg via INTRA_ARTICULAR

## 2017-09-10 NOTE — Progress Notes (Signed)
   Procedure Note  Patient: Sharon [name]             Date of Birth: [birth year]           MRN: [id number]             Visit Date: 09/10/2017  Procedures: Visit Diagnoses: Unilateral primary osteoarthritis, left knee  Large Joint Inj: L knee on 09/10/2017 4:39 PM Indications: pain and diagnostic evaluation Details: 22 G 1.5 in needle, superolateral approach  Arthrogram: No  Medications: 88 mg Hyaluronan 88 MG/4ML Outcome: tolerated well, no immediate complications Procedure, treatment alternatives, risks and benefits explained, specific risks discussed. Consent was given by the patient. Immediately prior to procedure a time out was called to verify the correct patient, procedure, equipment, support staff and site/side marked as required. Patient was prepped and draped in the usual sterile fashion.    The patient is coming in today for scheduled hyaluronic acid injection of Monovisc in her left knee to treat moderate medial compartment arthritic changes.  We performed a knee arthroscopy in January of this year she had a large medial meniscal tear with thinning of the cartilage of the medial compartment of her knee with no full-thickness cartilage loss.  She still has problems with range of motion of her knee as well as swelling.  On exam she does have swelling today of her knee and lacking full motion.  I was able to aspirate about 15 cc of fluid from the knee and I placed the hyaluronic acid injection of Monovisc through the superior lateral aspect of her left knee.  She tolerated this very well.  At this point she will follow-up as needed and she will continue work on strengthening and range of motion.  If she is having issues she will let us know.  All questions concerns were answered and addressed.

## 2017-09-11 ENCOUNTER — Telehealth (INDEPENDENT_AMBULATORY_CARE_PROVIDER_SITE_OTHER): Payer: Self-pay | Admitting: Orthopaedic Surgery

## 2017-09-11 NOTE — Telephone Encounter (Signed)
This can be normal.  She should try to ice and elevate her knee and if it still bad after Wednesday, then we need to see her Thursday.

## 2017-09-11 NOTE — Telephone Encounter (Signed)
Patient called stated had gel injection 09/10/17 and knee is swollen and looks like fluid. Cant bend knee and wants to know if this is normal  Please call patient to advise

## 2017-09-11 NOTE — Telephone Encounter (Signed)
See below message and let me know what we should do

## 2017-09-12 NOTE — Telephone Encounter (Signed)
Patient aware of the below message  

## 2017-09-13 ENCOUNTER — Ambulatory Visit (INDEPENDENT_AMBULATORY_CARE_PROVIDER_SITE_OTHER): Payer: PRIVATE HEALTH INSURANCE | Admitting: Physician Assistant

## 2017-09-13 ENCOUNTER — Encounter (INDEPENDENT_AMBULATORY_CARE_PROVIDER_SITE_OTHER): Payer: Self-pay | Admitting: Physician Assistant

## 2017-09-13 DIAGNOSIS — M1712 Unilateral primary osteoarthritis, left knee: Secondary | ICD-10-CM

## 2017-09-13 NOTE — Progress Notes (Signed)
HPI: Sharon [name] returns today status post the supplemental injection left knee 09/10/2017.  States she is had increased pain swelling in the knee since the injection on Monday.  She feels like the knee may give out on her.  She is tried ice ibuprofen and elevation.  She denies any fevers chills.  No other mechanical symptoms of the knee.  Review of systems: See HPI otherwise negative  Physical exam: General: Well-developed well-nourished female no acute distress mood affect appropriate.  Psych alert and oriented x3 Left knee: She lacks the last 10-15 degrees in full extension actively passively I can bring her within 5-70 degrees of full extension.  She flexes to approximately 110 degrees.  She has tenderness along medial joint line.  Positive effusion.  There is no abnormal warmth or erythema about the knee.  Left calf supple nontender.  Impression: Left knee osteoarthritis medial compartment Left knee recurrent effusion  Plan: This point time hinged knee brace recommended a.  She states she has one at home that she can wear.  Did give her an Ace bandage for compression that she can wear during the day if she is not wearing the brace.  Offered aspiration and injection with cortisone today she defers.  Therefore we will see her back at her regular scheduled appointment.  Sooner if she decides that she wants the aspiration of the knee.  Recommend she keep working on range of motion strengthening the knee.  Continue ibuprofen and ice.

## 2017-09-26 ENCOUNTER — Telehealth (INDEPENDENT_AMBULATORY_CARE_PROVIDER_SITE_OTHER): Payer: Self-pay | Admitting: Orthopaedic Surgery

## 2017-09-26 DIAGNOSIS — M1712 Unilateral primary osteoarthritis, left knee: Secondary | ICD-10-CM

## 2017-09-26 NOTE — Telephone Encounter (Signed)
I spoke to her.  I do feel that she would benefit from physical therapy on her knee as an outpatient.  Please set this up for [facility] physical therapy for any modalities to improve the function of her left knee and decrease her pain.  Thanks

## 2017-09-26 NOTE — Telephone Encounter (Signed)
Patient called stating that she is still in pain from surgery. Had surgery in January. Had gel injections and still in pain.  Please all patient to advise. Offered appt she declined. Wanted to talk to him first

## 2017-09-26 NOTE — Telephone Encounter (Signed)
See message below °

## 2017-09-27 NOTE — Telephone Encounter (Signed)
Please fax to [facility] PT, thanks

## 2017-11-10 ENCOUNTER — Telehealth: Payer: Self-pay | Admitting: Family Medicine

## 2017-11-10 DIAGNOSIS — K219 Gastro-esophageal reflux disease without esophagitis: Secondary | ICD-10-CM

## 2017-11-12 ENCOUNTER — Encounter: Payer: Self-pay | Admitting: Family Medicine

## 2017-11-13 NOTE — Telephone Encounter (Signed)
Contacted Sharon [name] at [facility] to confirm the date of last refill; he states that it was refilled for 30 days on 10/14/17; will route to office for final disposition.   Pantoprazole LOV: 06/30/17 Last Refill: 10/14/17 PCP: [place]: [facility] [place], [place]

## 2017-11-22 NOTE — Telephone Encounter (Signed)
Refill request denied. Patient has not seen provider to discuss reflux/reflux medication for over a year. Please call patient to schedule appointment.

## 2017-12-13 IMAGING — CR DG CHEST 2V
2 series · 2 of 2 positions shown · non-contrast
Comparison: 10/21/2015

CLINICAL DATA: Preop, fever, dyspnea.

EXAM:
CHEST  2 VIEW

[w chest pa]
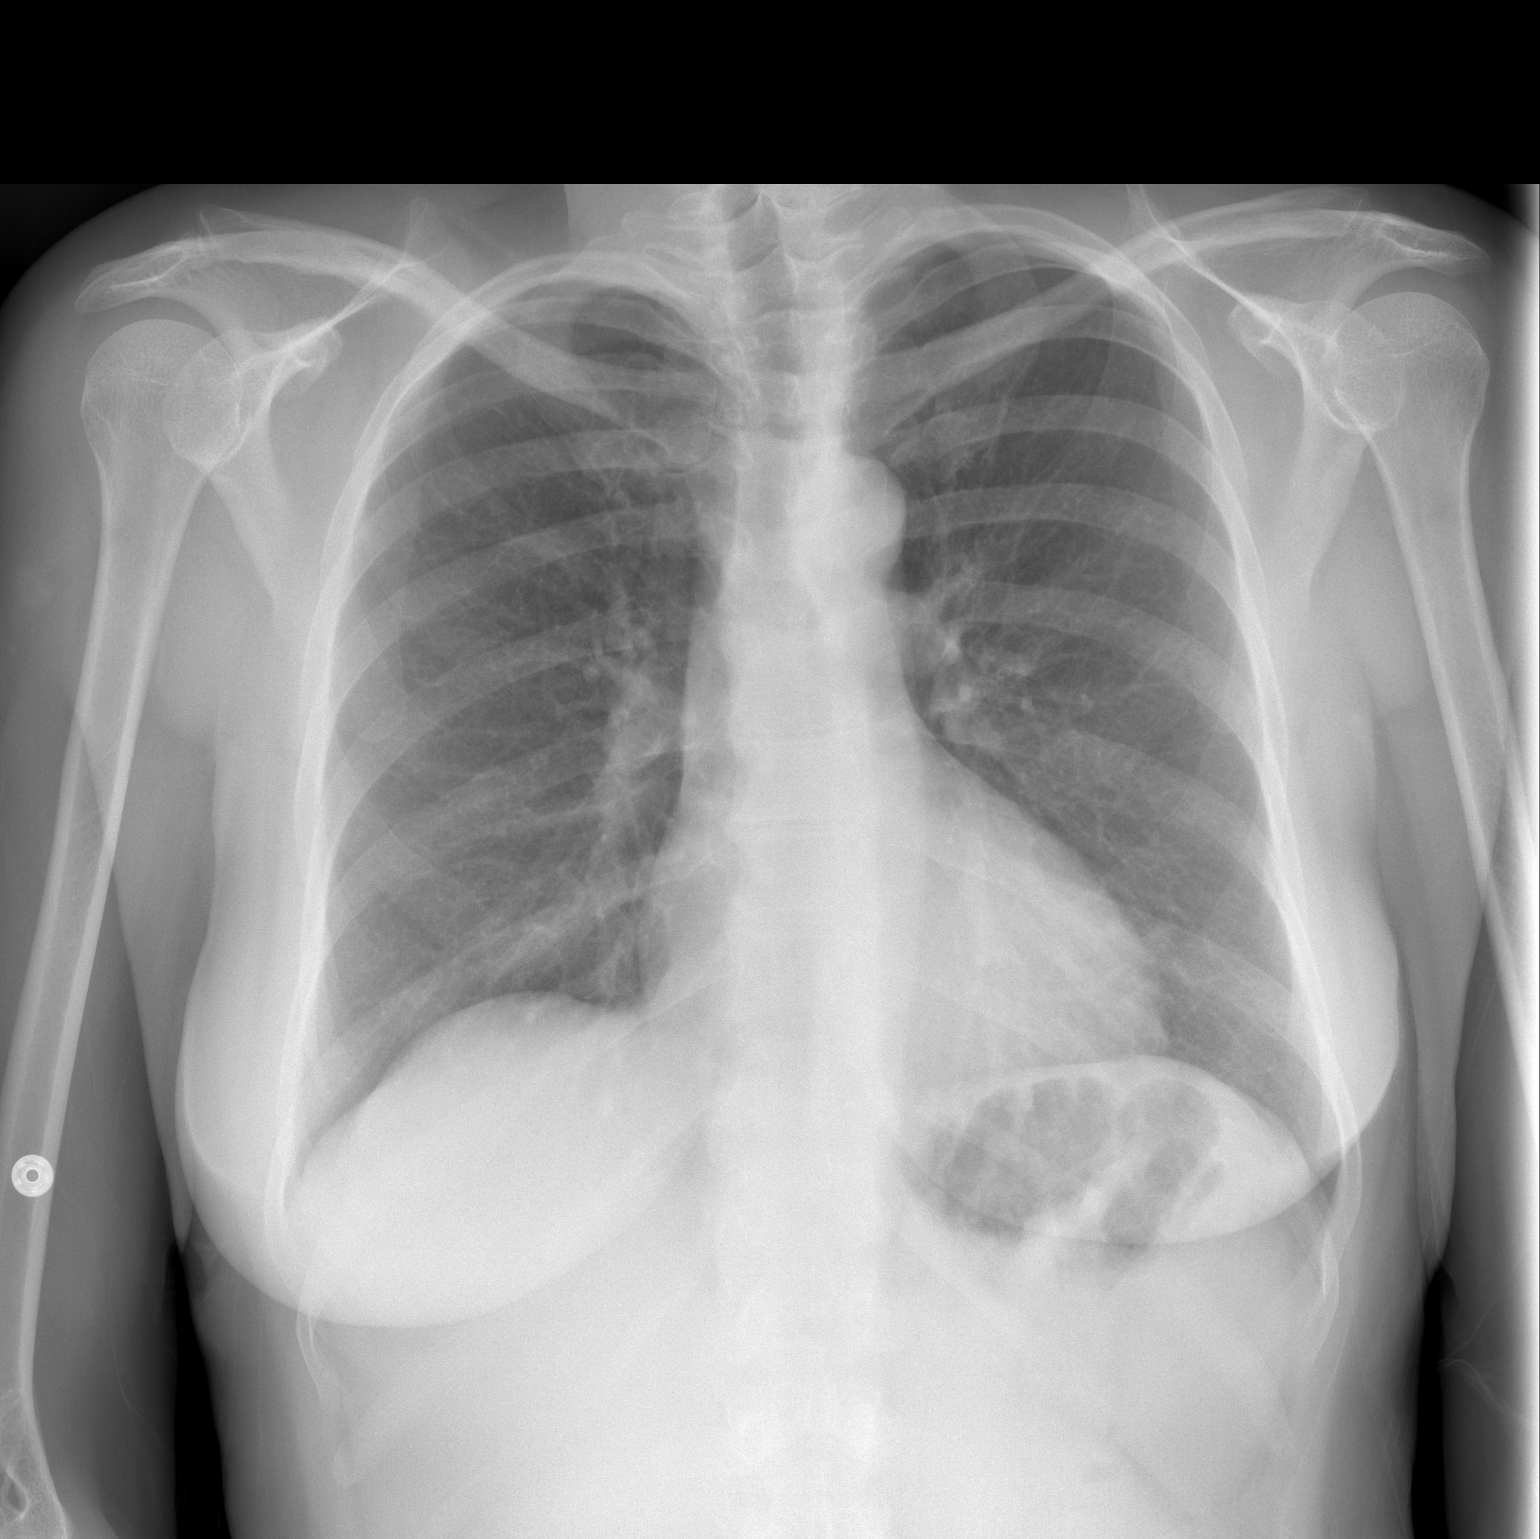

[w chest lat]
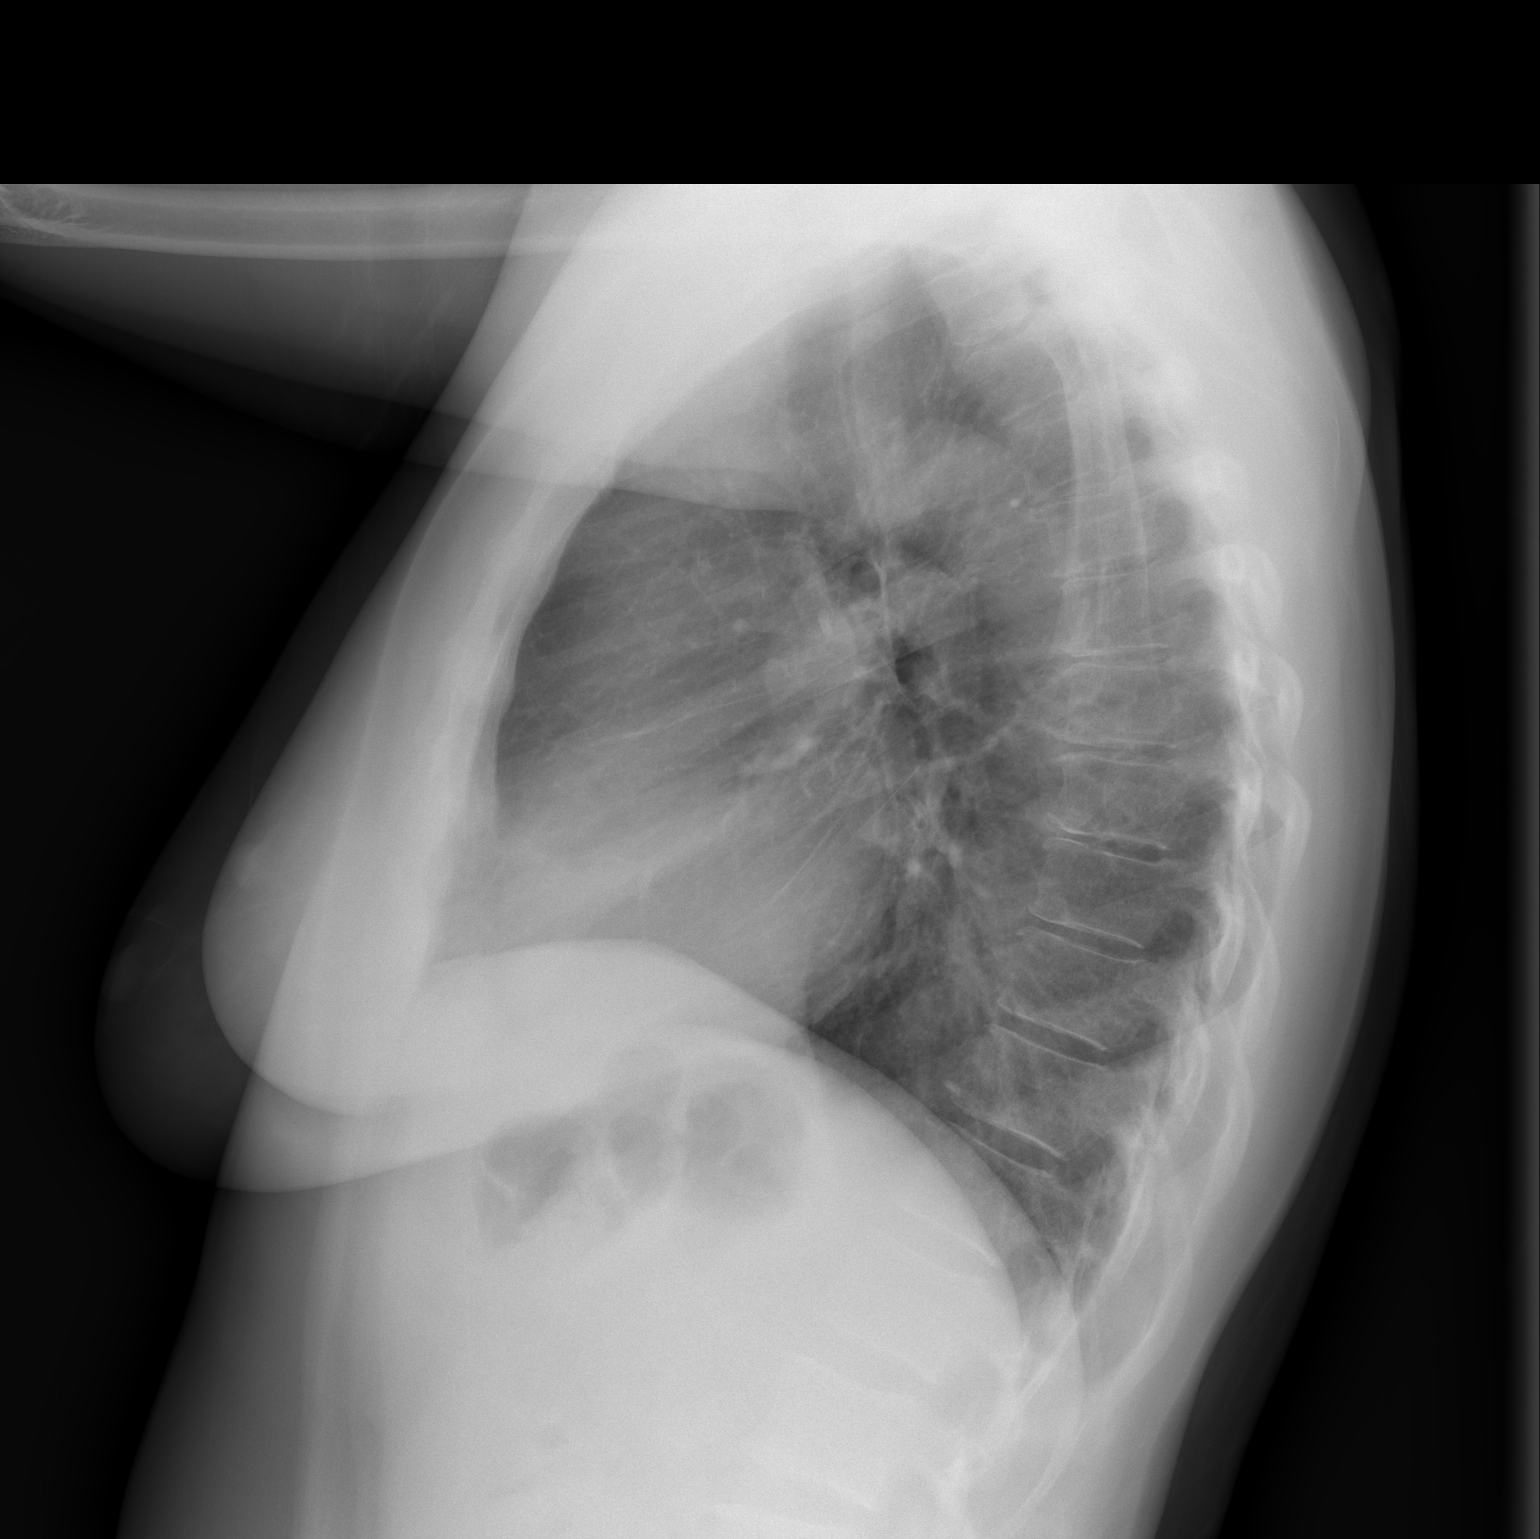

[2 of 2 positions shown; findings below may reference images not displayed]

FINDINGS: The heart size and mediastinal contours are within normal limits.
Both lungs are clear. The visualized skeletal structures are
nonacute.
IMPRESSION: No active cardiopulmonary disease.

## 2017-12-24 ENCOUNTER — Other Ambulatory Visit: Payer: Self-pay | Admitting: Family Medicine

## 2017-12-24 DIAGNOSIS — K219 Gastro-esophageal reflux disease without esophagitis: Secondary | ICD-10-CM

## 2017-12-25 NOTE — Telephone Encounter (Signed)
Pantoprazole refill Last OV:06/30/17 Last refill:11/08/16 90 tab/3 refill (expired) ZOX:WRUEA [id number] [place], [place] [phone] (Phone) [phone] (Fax)

## 2018-01-01 ENCOUNTER — Ambulatory Visit: Payer: PRIVATE HEALTH INSURANCE | Admitting: Family Medicine

## 2018-01-01 ENCOUNTER — Other Ambulatory Visit: Payer: Self-pay

## 2018-01-01 ENCOUNTER — Encounter: Payer: Self-pay | Admitting: Family Medicine

## 2018-01-01 VITALS — BP 110/62 | HR 72 | Temp 99.1°F | Resp 18 | Ht 67.5 in | Wt 141.8 lb

## 2018-01-01 DIAGNOSIS — Z131 Encounter for screening for diabetes mellitus: Secondary | ICD-10-CM

## 2018-01-01 DIAGNOSIS — M545 Low back pain, unspecified: Secondary | ICD-10-CM

## 2018-01-01 DIAGNOSIS — K219 Gastro-esophageal reflux disease without esophagitis: Secondary | ICD-10-CM

## 2018-01-01 DIAGNOSIS — I10 Essential (primary) hypertension: Secondary | ICD-10-CM | POA: Diagnosis not present

## 2018-01-01 DIAGNOSIS — G8929 Other chronic pain: Secondary | ICD-10-CM

## 2018-01-01 DIAGNOSIS — F411 Generalized anxiety disorder: Secondary | ICD-10-CM

## 2018-01-01 DIAGNOSIS — Z130 Encounter for screening for diseases of the blood and blood-forming organs and certain disorders involving the immune mechanism: Secondary | ICD-10-CM

## 2018-01-01 MED ORDER — NITROGLYCERIN 0.4 % RE OINT: 0.4000 [in_us] | TOPICAL_OINTMENT | Freq: Two times a day (BID) | RECTAL | 1 refills | Status: AC | PRN

## 2018-01-01 MED ORDER — PANTOPRAZOLE SODIUM 40 MG PO TBEC: 40.0000 mg | DELAYED_RELEASE_TABLET | Freq: Every day | ORAL | 3 refills | Status: DC

## 2018-01-01 MED ORDER — NEBIVOLOL HCL 5 MG PO TABS: 5.0000 mg | ORAL_TABLET | Freq: Every day | ORAL | 1 refills | Status: DC

## 2018-01-01 MED ORDER — CLONAZEPAM 0.5 MG PO TABS: 0.5000 mg | ORAL_TABLET | Freq: Every day | ORAL | 5 refills | Status: DC

## 2018-01-01 MED ORDER — ACYCLOVIR 400 MG PO TABS: 400.0000 mg | ORAL_TABLET | Freq: Every day | ORAL | 3 refills | Status: DC

## 2018-01-01 MED ORDER — PRIMIDONE 50 MG PO TABS: ORAL_TABLET | ORAL | 1 refills | Status: DC

## 2018-01-01 MED ORDER — ONDANSETRON HCL 8 MG PO TABS: 8.0000 mg | ORAL_TABLET | Freq: Three times a day (TID) | ORAL | 0 refills | Status: DC | PRN

## 2018-01-01 MED ORDER — RANITIDINE HCL 150 MG PO TABS: ORAL_TABLET | ORAL | 1 refills | Status: DC

## 2018-01-01 NOTE — Patient Instructions (Addendum)
IF you received an x-ray today, you will receive an invoice from [facility]. Please contact [facility] at [phone] with questions or concerns regarding your invoice.   IF you received labwork today, you will receive an invoice from [facility]. Please contact [facility] at [phone] with questions or concerns regarding your invoice.   Our billing staff will not be able to assist you with questions regarding bills from these companies.  You will be contacted with the lab results as soon as they are available. The fastest way to get your results is to activate your My Chart account. Instructions are located on the last page of this paperwork. If you have not heard from [place] regarding the results in 2 weeks, please contact this office.     Generalized Anxiety Disorder, Adult Generalized anxiety disorder (GAD) is a mental health disorder. People with this condition constantly worry about everyday events. Unlike normal anxiety, worry related to GAD is not triggered by a specific event. These worries also do not fade or get better with time. GAD interferes with life functions, including relationships, work, and school. GAD can vary from mild to severe. People with severe GAD can have intense waves of anxiety with physical symptoms (panic attacks). What are the causes? The exact cause of GAD is not known. What increases the risk? This condition is more likely to develop in:  Women.  People who have a family history of anxiety disorders.  People who are very shy.  People who experience very stressful life events, such as the death of a loved one.  People who have a very stressful family environment.  What are the signs or symptoms? People with GAD often worry excessively about many things in their lives, such as their health and family. They may also be overly concerned about:  Doing well at work.  Being on time.  Natural disasters.  Friendships.  Physical  symptoms of GAD include:  Fatigue.  Muscle tension or having muscle twitches.  Trembling or feeling shaky.  Being easily startled.  Feeling like your heart is pounding or racing.  Feeling out of breath or like you cannot take a deep breath.  Having trouble falling asleep or staying asleep.  Sweating.  Nausea, diarrhea, or irritable bowel syndrome (IBS).  Headaches.  Trouble concentrating or remembering facts.  Restlessness.  Irritability.  How is this diagnosed? Your health care provider can diagnose GAD based on your symptoms and medical history. You will also have a physical exam. The health care provider will ask specific questions about your symptoms, including how severe they are, when they started, and if they come and go. Your health care provider may ask you about your use of alcohol or drugs, including prescription medicines. Your health care provider may refer you to a mental health specialist for further evaluation. Your health care provider will do a thorough examination and may perform additional tests to rule out other possible causes of your symptoms. To be diagnosed with GAD, a person must have anxiety that:  Is out of his or her control.  Affects several different aspects of his or her life, such as work and relationships.  Causes distress that makes him or her unable to take part in normal activities.  Includes at least three physical symptoms of GAD, such as restlessness, fatigue, trouble concentrating, irritability, muscle tension, or sleep problems.  Before your health care provider can confirm a diagnosis of GAD, these symptoms must be present more days than  they are not, and they must last for six months or longer. How is this treated? The following therapies are usually used to treat GAD:  Medicine. Antidepressant medicine is usually prescribed for long-term daily control. Antianxiety medicines may be added in severe cases, especially when panic  attacks occur.  Talk therapy (psychotherapy). Certain types of talk therapy can be helpful in treating GAD by providing support, education, and guidance. Options include: ? Cognitive behavioral therapy (CBT). People learn coping skills and techniques to ease their anxiety. They learn to identify unrealistic or negative thoughts and behaviors and to replace them with positive ones. ? Acceptance and commitment therapy (ACT). This treatment teaches people how to be mindful as a way to cope with unwanted thoughts and feelings. ? Biofeedback. This process trains you to manage your body's response (physiological response) through breathing techniques and relaxation methods. You will work with a therapist while machines are used to monitor your physical symptoms.  Stress management techniques. These include yoga, meditation, and exercise.  A mental health specialist can help determine which treatment is best for you. Some people see improvement with one type of therapy. However, other people require a combination of therapies. Follow these instructions at home:  Take over-the-counter and prescription medicines only as told by your health care provider.  Try to maintain a normal routine.  Try to anticipate stressful situations and allow extra time to manage them.  Practice any stress management or self-calming techniques as taught by your health care provider.  Do not punish yourself for setbacks or for not making progress.  Try to recognize your accomplishments, even if they are small.  Keep all follow-up visits as told by your health care provider. This is important. Contact a health care provider if:  Your symptoms do not get better.  Your symptoms get worse.  You have signs of depression, such as: ? A persistently sad, cranky, or irritable mood. ? Loss of enjoyment in activities that used to bring you joy. ? Change in weight or eating. ? Changes in sleeping habits. ? Avoiding friends  or family members. ? Loss of energy for normal tasks. ? Feelings of guilt or worthlessness. Get help right away if:  You have serious thoughts about hurting yourself or others. If you ever feel like you may hurt yourself or others, or have thoughts about taking your own life, get help right away. You can go to your nearest emergency department or call:  Your local emergency services (911 in the [place]  A suicide crisis helpline, such as the [facility] at 762-068-4476. This is open 24 hours a day.  Summary  Generalized anxiety disorder (GAD) is a mental health disorder that involves worry that is not triggered by a specific event.  People with GAD often worry excessively about many things in their lives, such as their health and family.  GAD may cause physical symptoms such as restlessness, trouble concentrating, sleep problems, frequent sweating, nausea, diarrhea, headaches, and trembling or muscle twitching.  A mental health specialist can help determine which treatment is best for you. Some people see improvement with one type of therapy. However, other people require a combination of therapies. This information is not intended to replace advice given to you by your health care provider. Make sure you discuss any questions you have with your health care provider. Document Released: 09/30/2012 Document Revised: 04/25/2016 Document Reviewed: 04/25/2016 Elsevier Interactive Patient Education  [employer].

## 2018-01-01 NOTE — Progress Notes (Signed)
Chief Complaint  Patient presents with  . Medication Refill    all meds would also like to have a mole on forehead looked at     HPI   Hypertension: Patient here for follow-up of elevated blood pressure. She is not exercising and is adherent to low salt diet.  Blood pressure is well controlled at home. Cardiac symptoms none. Patient denies chest pain, chest pressure/discomfort, claudication, exertional chest pressure/discomfort, fatigue, lower extremity edema, orthopnea, paroxysmal nocturnal dyspnea and tachypnea.  Cardiovascular risk factors: none. Use of agents associated with hypertension: NSAIDS. History of target organ damage: none. BP Readings from Last 3 Encounters:  01/01/18 110/62  08/17/17 125/80  06/30/17 126/70    GERD: Paitent complains of heartburn. This has been associated with heartburn.  She denies deep pressure at base of neck, difficulty swallowing, dysphagia, early satiety, shortness of breath, waterbrash and wheezing. Symptoms have been present for several months. She denies dysphagia.  She has not lost weight. She denies melena, hematochezia, hematemesis, and coffee ground emesis. Medical therapy in the past has included H2 antagonists.   Anxiety: Patient complains of anxiety She takes prn klonopin for anxiety She denies recent panic attacks No ER visit for anxiety Does not use daily anxiety meds Lab Results  Component Value Date   TSH 1.190 11/08/2016     Past Medical History:  Diagnosis Date  . Allergy   . Anal fissure   . Anxiety   . Arthritis   . Blood clot in vein    left leg  . Detached retina    BOTH SCLERA BUCKLE BOTH EYES  . DVT (deep venous thrombosis) (HCC)    left leg  . Endometriosis   . Family history of adverse reaction to anesthesia    DAUGHTER POST OP PONV  . GERD (gastroesophageal reflux disease)   . Glaucoma    RIGHT WORST THAN LEFT  . Heart murmur   . History of hemorrhoids   . Hypertension   . IBS (irritable bowel  syndrome)   . Perforated eardrum, right    SMALL HOLE  . PONV (postoperative nausea and vomiting)   . Pulmonary embolism (HCC) 6 YRS AGO    Current Outpatient Medications  Medication Sig Dispense Refill  . acetaminophen (TYLENOL) 500 MG tablet Take 500 mg by mouth 2 (two) times daily.    Marland Kitchen acyclovir (ZOVIRAX) 400 MG tablet Take 1 tablet (400 mg total) by mouth at bedtime. 90 tablet 3  . Ascorbic Acid (VITAMIN C) 1000 MG tablet Take 1,000 mg by mouth daily.    . celecoxib (CELEBREX) 200 MG capsule Take 1 capsule (200 mg total) by mouth daily. (Patient taking differently: Take 200 mg by mouth daily as needed (For pain.). ) 90 capsule 1  . Cholecalciferol (VITAMIN D) 2000 UNITS tablet Take 2,000 Units by mouth daily.    . clonazePAM (KLONOPIN) 0.5 MG tablet Take 1 tablet (0.5 mg total) by mouth at bedtime. 30 tablet 5  . diclofenac sodium (VOLTAREN) 1 % GEL Apply 2 g topically 4 (four) times daily. (Patient taking differently: Apply 2 g topically 4 (four) times daily as needed (For pain.). ) 100 g 3  . EPINEPHrine 0.3 mg/0.3 mL IJ SOAJ injection Inject 0.3 mg into the muscle once as needed (For anaphylaxis.).   1  . Magnesium 500 MG CAPS Take 500 mg by mouth daily.     . nebivolol (BYSTOLIC) 5 MG tablet Take 1 tablet (5 mg total) by mouth daily. 90 tablet 1  .  Nitroglycerin (RECTIV) 0.4 % OINT Place 0.4 inches rectally 2 (two) times daily as needed (For anal fissures.). 30 g 1  . ondansetron (ZOFRAN) 8 MG tablet Take 1 tablet (8 mg total) by mouth every 8 (eight) hours as needed for nausea or vomiting. 20 tablet 0  . oxyCODONE-acetaminophen (PERCOCET/ROXICET) 5-325 MG tablet Take 1-2 tablets by mouth every 4 (four) hours as needed (For pain.).   0  . pantoprazole (PROTONIX) 40 MG tablet Take 1 tablet (40 mg total) by mouth at bedtime. 90 tablet 3  . Polyethylene Glycol 3350 (MIRALAX PO) Take 17 g by mouth daily as needed (For constipation.).     Marland Kitchen primidone (MYSOLINE) 50 MG tablet TAKE 2 TABLETS  (100 MG TOTAL) BY MOUTH AT BEDTIME. 180 tablet 1  . ranitidine (ZANTAC) 150 MG tablet Take 2 tablets in the morning as needed for reflux 180 tablet 1  . Travoprost, BAK Free, (TRAVATAN Z) 0.004 % SOLN ophthalmic solution Place 1 drop into both eyes at bedtime.     . vitamin B-12 (CYANOCOBALAMIN) 1000 MCG tablet Take 1,000 mcg by mouth daily.     No current facility-administered medications for this visit.     Allergies:  Allergies  Allergen Reactions  . Dextromethorphan Polistirex Er Other (See Comments)    Pt states caused "a lump" in her throat, making it difficult to swallow  . Hydrocodone Nausea And Vomiting  . Oxycodone-Acetaminophen Other (See Comments)    Made fingers tingle and go numb, started "feeling weird".   . Penicillins Other (See Comments)    Reaction unknown from childhood Has patient had a PCN reaction causing immediate rash, facial/tongue/throat swelling, SOB or lightheadedness with hypotension: unknown Has patient had a PCN reaction causing severe rash involving mucus membranes or skin necrosis: unknown  Has patient had a PCN reaction that required hospitalization: no Has patient had a PCN reaction occurring within the last 10 years: no If all of the above answers are "NO", then may proceed with Cephalosporin use.   . Tape Itching and Other (See Comments)    Bandaids - leaves red marks    Past Surgical History:  Procedure Laterality Date  . ABDOMINAL HYSTERECTOMY    . CATARACT EXTRACTION Bilateral 2015  . CYSTOSCOPY W/ URETERAL STENT PLACEMENT Right 05/23/2017   Procedure: CYSTOSCOPY WITH RETROGRADE PYELOGRAM/URETERAL RIGHT STENT PLACEMENT;  Surgeon: Bjorn Loser, MD;  Location: [facility];  Service: Urology;  Laterality: Right;  . CYSTOSCOPY W/ URETERAL STENT PLACEMENT Right 06/04/2017   Procedure: CYSTOSCOPY WITHRIGHT RETROGRADE PYELOGRAMRIGHT /URETEREOSCOPY  STENT PLACEMENT;  Surgeon: Lucas Mallow, MD;  Location[facility];  Service:  Urology;  Laterality: Right;  . ENDOMETRIAL ABLATION    . [place]  2011  . INCONTINENCE SURGERY    . INNER EAR SURGERY     X 2  . KNEE SURGERY    . RECTAL PROLAPSE REPAIR    . RETINAL DETACHMENT SURGERY Bilateral 2000  . RETINAL DETACHMENT SURGERY Bilateral   . TUBAL LIGATION    . VEIN SURGERY Left 04/2010    Social History   Socioeconomic History  . Marital status: Divorced    Spouse name: Not on file  . Number of children: 1  . Years of education: Not on file  . Highest education level: Not on file  Occupational History  . Occupation: Restaurant manager, fast food: [employer]  Social Needs  . Financial resource strain: Not on file  . Food  insecurity:    Worry: Not on file    Inability: Not on file  . Transportation needs:    Medical: Not on file    Non-medical: Not on file  Tobacco Use  . Smoking status: Never Smoker  . Smokeless tobacco: Never Used  Substance and Sexual Activity  . Alcohol use: Yes    Frequency: Never    Comment: occ  . Drug use: No    Comment: former maryjuana use- quit in 1995  . Sexual activity: Yes  Lifestyle  . Physical activity:    Days per week: Not on file    Minutes per session: Not on file  . Stress: Not on file  Relationships  . Social connections:    Talks on phone: Not on file    Gets together: Not on file    Attends religious service: Not on file    Active member of club or organization: Not on file    Attends meetings of clubs or organizations: Not on file    Relationship status: Not on file  Other Topics Concern  . Not on file  Social History Narrative   Marital status: divorced x 2; dating seriously x 3 years.  Boyfriend with HIV.       Children:  1 daughter (63); no grandchildren      Lives:  With boyfriend.        Employment: works at [employer]      Tobacco: none      Alcohol: rarely; socially      Exercise: none; has treadmill and elliptical and bikes    Family History  Problem Relation  Age of Onset  . Emphysema Mother        smoker  . Allergies Mother   . Asthma Mother   . Heart disease Mother        AMI as cause of death  . COPD Mother   . Asthma Father   . Heart disease Father        AMI as cause of death  . Diabetes Father   . Asthma Brother   . Heart disease Brother 55       heart failure  . Lung cancer Maternal Grandfather        was a smoker  . Cancer Maternal Grandfather        lung  . Heart disease Paternal Grandmother   . Heart disease Paternal Grandfather   . Colon cancer Neg Hx      ROS Review of Systems See HPI Constitution: No fevers or chills No malaise No diaphoresis Skin: No rash or itching Eyes: no blurry vision, no double vision GU: no dysuria or hematuria Neuro: no dizziness or headaches all others reviewed and negative   Objective: Vitals:   01/01/18 0841  BP: 110/62  Pulse: 72  Resp: 18  Temp: 99.1 F (37.3 C)  TempSrc: Oral  SpO2: 100%  Weight: 141 lb 12.8 oz (64.3 kg)  Height: 5' 7.5" (1.715 m)    Physical Exam  Constitutional: She is oriented to person, place, and time. She appears well-developed and well-nourished.  HENT:  Head: Normocephalic and atraumatic.  Eyes: Conjunctivae and EOM are normal.  Cardiovascular: Normal rate, regular rhythm and normal heart sounds.  Pulmonary/Chest: Effort normal and breath sounds normal. No stridor. No respiratory distress.  Neurological: She is alert and oriented to person, place, and time.  Skin: Skin is warm. Capillary refill takes less than 2 seconds.  Psychiatric: She has a normal  mood and affect. Her behavior is normal. Judgment and thought content normal.    Assessment and Plan Parisa was seen today for medication refill.  Diagnoses and all orders for this visit:  Screening for diabetes mellitus -     Hemoglobin A1c  Gastroesophageal reflux disease without esophagitis- -Discussed GERD triggers -Discussed PPI and how to taper off -Advised eating smaller more  frequent meals -Emphasis placed on taking reflux medications BEFORE eating -discussed when GI referral and Endoscopy is necessary  -     pantoprazole (PROTONIX) 40 MG tablet; Take 1 tablet (40 mg total) by mouth at bedtime. -     ranitidine (ZANTAC) 150 MG tablet; Take 2 tablets in the morning as needed for reflux  Chronic bilateral low back pain without sciatica  Essential hypertension- Patient's blood pressure is at goal of 139/89 or less. Condition is stable. Continue current medications and treatment plan. I recommend that you exercise for 30-45 minutes 5 days a week. I also recommend a balanced diet with fruits and vegetables every day, lean meats, and little fried foods. The DASH diet (you can find this online) is a good example of this.  -     nebivolol (BYSTOLIC) 5 MG tablet; Take 1 tablet (5 mg total) by mouth daily. -     Lipid panel -     Comprehensive metabolic panel  GAD (generalized anxiety disorder)- Anxiety Plan Medications: klonopin. Labs: Comprehensive metabolic profile and CBC. Handouts describing disease, natural history, and treatment were given to the patient. Reviewed concept of anxiety as biochemical imbalance of neurotransmitters and rationale for treatment.  -     clonazePAM (KLONOPIN) 0.5 MG tablet; Take 1 tablet (0.5 mg total) by mouth at bedtime. -     CBC  Screening, anemia, deficiency, iron -     CBC  Other orders -     Nitroglycerin (RECTIV) 0.4 % OINT; Place 0.4 inches rectally 2 (two) times daily as needed (For anal fissures.). -     ondansetron (ZOFRAN) 8 MG tablet; Take 1 tablet (8 mg total) by mouth every 8 (eight) hours as needed for nausea or vomiting. -     primidone (MYSOLINE) 50 MG tablet; TAKE 2 TABLETS (100 MG TOTAL) BY MOUTH AT BEDTIME. -     acyclovir (ZOVIRAX) 400 MG tablet; Take 1 tablet (400 mg total) by mouth at bedtime.     [place]

## 2018-01-02 LAB — CBC
Hematocrit: 36 % (ref 34.0–46.6)
Hemoglobin: 13 g/dL (ref 11.1–15.9)
MCH: 31.4 pg (ref 26.6–33.0)
MCHC: 36.1 g/dL — AB (ref 31.5–35.7)
MCV: 87 fL (ref 79–97)
PLATELETS: 272 10*3/uL (ref 150–450)
RBC: 4.14 x10E6/uL (ref 3.77–5.28)
RDW: 12.9 % (ref 12.3–15.4)
WBC: 3.9 10*3/uL (ref 3.4–10.8)

## 2018-01-02 LAB — COMPREHENSIVE METABOLIC PANEL
A/G RATIO: 2.2 (ref 1.2–2.2)
ALBUMIN: 4.6 g/dL (ref 3.5–5.5)
ALK PHOS: 63 IU/L (ref 39–117)
ALT: 8 IU/L (ref 0–32)
AST: 13 IU/L (ref 0–40)
BILIRUBIN TOTAL: 0.2 mg/dL (ref 0.0–1.2)
BUN / CREAT RATIO: 27 — AB (ref 9–23)
BUN: 20 mg/dL (ref 6–24)
CHLORIDE: 104 mmol/L (ref 96–106)
CO2: 23 mmol/L (ref 20–29)
Calcium: 9.6 mg/dL (ref 8.7–10.2)
Creatinine, Ser: 0.74 mg/dL (ref 0.57–1.00)
GFR calc Af Amer: 107 mL/min/{1.73_m2} (ref >59)
GFR calc non Af Amer: 93 mL/min/{1.73_m2} (ref >59)
GLOBULIN, TOTAL: 2.1 g/dL (ref 1.5–4.5)
GLUCOSE: 77 mg/dL (ref 65–99)
POTASSIUM: 4.1 mmol/L (ref 3.5–5.2)
SODIUM: 142 mmol/L (ref 134–144)
Total Protein: 6.7 g/dL (ref 6.0–8.5)

## 2018-01-02 LAB — LIPID PANEL
CHOL/HDL RATIO: 2.6 ratio (ref 0.0–4.4)
Cholesterol, Total: 139 mg/dL (ref 100–199)
HDL: 53 mg/dL (ref >39)
LDL CALC: 73 mg/dL (ref 0–99)
Triglycerides: 67 mg/dL (ref 0–149)
VLDL Cholesterol Cal: 13 mg/dL (ref 5–40)

## 2018-01-02 LAB — HEMOGLOBIN A1C
Est. average glucose Bld gHb Est-mCnc: 103 mg/dL
Hgb A1c MFr Bld: 5.2 % (ref 4.8–5.6)

## 2018-03-06 DIAGNOSIS — T83721A Exposure of implanted vaginal mesh and other prosthetic materials into vagina, initial encounter: Secondary | ICD-10-CM | POA: Insufficient documentation

## 2018-03-06 DIAGNOSIS — K5902 Outlet dysfunction constipation: Secondary | ICD-10-CM | POA: Insufficient documentation

## 2018-03-27 ENCOUNTER — Telehealth (INDEPENDENT_AMBULATORY_CARE_PROVIDER_SITE_OTHER): Payer: Self-pay | Admitting: Orthopaedic Surgery

## 2018-03-27 ENCOUNTER — Ambulatory Visit (INDEPENDENT_AMBULATORY_CARE_PROVIDER_SITE_OTHER): Payer: PRIVATE HEALTH INSURANCE | Admitting: Orthopaedic Surgery

## 2018-03-27 ENCOUNTER — Encounter (INDEPENDENT_AMBULATORY_CARE_PROVIDER_SITE_OTHER): Payer: Self-pay | Admitting: Orthopaedic Surgery

## 2018-03-27 DIAGNOSIS — M25511 Pain in right shoulder: Secondary | ICD-10-CM | POA: Diagnosis not present

## 2018-03-27 DIAGNOSIS — M7061 Trochanteric bursitis, right hip: Secondary | ICD-10-CM

## 2018-03-27 DIAGNOSIS — M25562 Pain in left knee: Secondary | ICD-10-CM

## 2018-03-27 DIAGNOSIS — M542 Cervicalgia: Secondary | ICD-10-CM

## 2018-03-27 DIAGNOSIS — G8929 Other chronic pain: Secondary | ICD-10-CM | POA: Insufficient documentation

## 2018-03-27 DIAGNOSIS — M1712 Unilateral primary osteoarthritis, left knee: Secondary | ICD-10-CM

## 2018-03-27 NOTE — Telephone Encounter (Signed)
I guess MRI her cervical spine as well to rule out a herniated disc.

## 2018-03-27 NOTE — Progress Notes (Addendum)
Office Visit Note   Patient: [name]           Date of Birth: May 19, 1966           MRN: [id number] Visit Date: 03/27/2018              Requested by: Forrest Moron, MD [place], [place] [address] PCP: Forrest Moron, MD   Assessment & Plan: Visit Diagnoses:  1. Unilateral primary osteoarthritis, left knee   2. Trochanteric bursitis, right hip   3. Chronic pain of left knee   4. Chronic right shoulder pain   5. Cervicalgia     Plan: At this point she is requesting MRIs of all the areas that are hurting her especially given the fact that we have followed her left knee for some time before obtain MRI and it showed the complex meniscal tearing.  At this point we need an MRI of her right shoulder rule out rotator cuff tear, MRI of her lumbar spine to rule out herniated disc, and MRI of her right hip to assess the cartilage of her hip as well as the bursa and the gluteus medius and minimus tendons, and we need an MRI of her left knee to assess progression of worsening cartilage changes and potentially new meniscal tearing given the swelling and pain.  She understands she will be difficult to get all these MRIs at once and it will take some planning.  Right now she does take anti-inflammatories and we will see her back once the MRIs been done of all these areas.  She says she is losing sleep at night because of all the pain.  Most painful to her her right shoulder and right hip.  Given her parascapular pain of the right side her neck pain will also obtain an MRI of her cervical spine.  Follow-Up Instructions: Follow-up will be once the MRIs are obtained  Orders:  No orders of the defined types were placed in this encounter.  No orders of the defined types were placed in this encounter.     Procedures: No procedures performed   Clinical Data: No additional findings.   Subjective: Chief Complaint  Patient presents with  . Left Knee - Follow-up  The patient is  someone simply seen before.  She has significant pain of her left knee and showed meniscal tearing on MRI with cartilage loss.  Informed arthroscopic intervention 2018.  The knee is been swelling for her and her in quite a bit on the left knee.  She is also had right hip pain we seen her for before right low back pain and right shoulder pain.  She has stiffness in all these areas.  Is waking her up at night and all areas are hurting significantly and not getting better.  We will try conservative treatment on all these areas.  This includes activity modification anti-inflammatories, excludes rest, ice, heat, includes injections multiple times.  She is been through physical therapy as well.  This is become frustrating for her.  She has pain in both of her heels now.  She does report some groin pain and low back pain and radicular symptoms going down her right leg.  The patient also reports neck pain that radiates into her parascapular area and shoulder on the right side.  HPI  Review of Systems She currently denies any headache, chest pain, shortness of breath, fever, chills, nausea, vomiting.  Objective: Vital Signs: LMP 10/11/2010   Physical Exam  She is alert and oriented x3 and in no acute distress Ortho Exam Examination of her right shoulder shows significant pain with external rotation and internal rotation with adduction.  When she reaches behind her is at the lower lumbar spine compared to the other side.  Overhead activities cause a lot of pain.  She does have positive Neer and Hawkins signs with slight weakness of the rotator cuff.  Examination of her right hip shows severe pain in the trochanteric area and pain with internal extra rotation in the groin area.  This does radiate into the lumbar spine as well and she has low back pain that radiates into the sciatic area.  Examination of her left knee does show mild effusion with painful range of motion.  The knee feels ligaments is stable.   Examination of both ankles show they are stable she does have evidence of plantar fasciitis.  Examination of her neck shows stiffness with a positive Spurling sign to the right side. Specialty Comments:  No specialty comments available.  Imaging: No results found.   PMFS History: Patient Active Problem List   Diagnosis Date Noted  . Chronic pain of left knee 03/27/2018  . Chronic right shoulder pain 03/27/2018  . Cervicalgia 03/27/2018  . Status post arthroscopy of left knee 08/06/2017  . Unilateral primary osteoarthritis, left knee 08/06/2017  . Tremor 07/10/2017  . Other meniscus derangements, posterior horn of medial meniscus, left knee 06/28/2017  . Ureteral stone 05/23/2017  . History of sepsis 05/23/2017  . Acute pain of left knee 04/09/2017  . Trochanteric bursitis, right hip 09/27/2016  . GAD (generalized anxiety disorder) 05/31/2014  . Rectocele 08/21/2011  . Uterovaginal prolapse 08/21/2011  . Anal fissure 05/05/2011  . Dyspnea 10/13/2010  . Hypertension 10/13/2010   Past Medical History:  Diagnosis Date  . Allergy   . Anal fissure   . Anxiety   . Arthritis   . Blood clot in vein    left leg  . Detached retina    BOTH SCLERA BUCKLE BOTH EYES  . DVT (deep venous thrombosis) (HCC)    left leg  . Endometriosis   . Family history of adverse reaction to anesthesia    DAUGHTER POST OP PONV  . GERD (gastroesophageal reflux disease)   . Glaucoma    RIGHT WORST THAN LEFT  . Heart murmur   . History of hemorrhoids   . Hypertension   . IBS (irritable bowel syndrome)   . Perforated eardrum, right    SMALL HOLE  . PONV (postoperative nausea and vomiting)   . Pulmonary embolism ([place]) 6 YRS AGO    Family History  Problem Relation Age of Onset  . Emphysema Mother        smoker  . Allergies Mother   . Asthma Mother   . Heart disease Mother        AMI as cause of death  . COPD Mother   . Asthma Father   . Heart disease Father        AMI as cause of death  .  Diabetes Father   . Asthma Brother   . Heart disease Brother 53       heart failure  . Lung cancer Maternal Grandfather        was a smoker  . Cancer Maternal Grandfather        lung  . Heart disease Paternal Grandmother   . Heart disease Paternal Grandfather   . Colon cancer Neg Hx  Past Surgical History:  Procedure Laterality Date  . ABDOMINAL HYSTERECTOMY    . CATARACT EXTRACTION Bilateral 2015  . CYSTOSCOPY W/ URETERAL STENT PLACEMENT Right 05/23/2017   Procedure: CYSTOSCOPY WITH RETROGRADE PYELOGRAM/URETERAL RIGHT STENT PLACEMENT;  Surgeon: Bjorn Loser, MD;  Location: [facility];  Service: Urology;  Laterality: Right;  . CYSTOSCOPY W/ URETERAL STENT PLACEMENT Right 06/04/2017   Procedure: CYSTOSCOPY WITHRIGHT RETROGRADE PYELOGRAMRIGHT /URETEREOSCOPY  STENT PLACEMENT;  Surgeon: Lucas Mallow, MD;  [facility];  Service: Urology;  Laterality: Right;  . ENDOMETRIAL ABLATION    . [place]  2011  . INCONTINENCE SURGERY    . INNER EAR SURGERY     X 2  . KNEE SURGERY    . RECTAL PROLAPSE REPAIR    . RETINAL DETACHMENT SURGERY Bilateral 2000  . RETINAL DETACHMENT SURGERY Bilateral   . TUBAL LIGATION    . VEIN SURGERY Left 04/2010   Social History   Occupational History  . Occupation: Restaurant manager, fast food: [employer]  Tobacco Use  . Smoking status: Never Smoker  . Smokeless tobacco: Never Used  Substance and Sexual Activity  . Alcohol use: Yes    Frequency: Never    Comment: occ  . Drug use: No    Comment: former maryjuana use- quit in 1995  . Sexual activity: Yes

## 2018-03-27 NOTE — Telephone Encounter (Signed)
Patient was seen today and said she forgot to tell Dr. Ninfa Linden that the pain she is having in her shoulder feels like it is stemming from her neck and running down her shoulder blade. She wanted to let him know this in case he wanted her to have an MRI of her neck too. # [phone]

## 2018-03-29 ENCOUNTER — Other Ambulatory Visit: Payer: PRIVATE HEALTH INSURANCE

## 2018-03-29 ENCOUNTER — Other Ambulatory Visit (INDEPENDENT_AMBULATORY_CARE_PROVIDER_SITE_OTHER): Payer: Self-pay

## 2018-03-29 DIAGNOSIS — M4807 Spinal stenosis, lumbosacral region: Secondary | ICD-10-CM

## 2018-03-29 DIAGNOSIS — M25511 Pain in right shoulder: Secondary | ICD-10-CM

## 2018-03-29 DIAGNOSIS — M25562 Pain in left knee: Secondary | ICD-10-CM

## 2018-03-29 DIAGNOSIS — G8929 Other chronic pain: Secondary | ICD-10-CM

## 2018-03-29 DIAGNOSIS — M25551 Pain in right hip: Secondary | ICD-10-CM

## 2018-03-29 DIAGNOSIS — M542 Cervicalgia: Secondary | ICD-10-CM

## 2018-04-03 ENCOUNTER — Ambulatory Visit
Admission: RE | Admit: 2018-04-03 | Discharge: 2018-04-03 | Disposition: A | Discharge status: Home / Self Care | Payer: PRIVATE HEALTH INSURANCE | Source: Ambulatory Visit | Attending: Obstetrics and Gynecology | Admitting: Obstetrics and Gynecology

## 2018-04-03 ENCOUNTER — Ambulatory Visit: Admission: RE | Admit: 2018-04-03 | Payer: PRIVATE HEALTH INSURANCE | Source: Ambulatory Visit

## 2018-04-03 DIAGNOSIS — N6489 Other specified disorders of breast: Secondary | ICD-10-CM

## 2018-04-11 ENCOUNTER — Ambulatory Visit
Admission: RE | Admit: 2018-04-11 | Discharge: 2018-04-11 | Disposition: A | Discharge status: Home / Self Care | Payer: PRIVATE HEALTH INSURANCE | Source: Ambulatory Visit | Attending: Orthopaedic Surgery | Admitting: Orthopaedic Surgery

## 2018-04-11 DIAGNOSIS — G8929 Other chronic pain: Secondary | ICD-10-CM

## 2018-04-11 DIAGNOSIS — M25511 Pain in right shoulder: Principal | ICD-10-CM

## 2018-04-11 DIAGNOSIS — M4807 Spinal stenosis, lumbosacral region: Secondary | ICD-10-CM

## 2018-04-13 ENCOUNTER — Ambulatory Visit
Admission: RE | Admit: 2018-04-13 | Discharge: 2018-04-13 | Disposition: A | Discharge status: Home / Self Care | Payer: PRIVATE HEALTH INSURANCE | Source: Ambulatory Visit | Attending: Orthopaedic Surgery | Admitting: Orthopaedic Surgery

## 2018-04-13 DIAGNOSIS — G8929 Other chronic pain: Secondary | ICD-10-CM

## 2018-04-13 DIAGNOSIS — M25562 Pain in left knee: Principal | ICD-10-CM

## 2018-04-13 DIAGNOSIS — M4807 Spinal stenosis, lumbosacral region: Secondary | ICD-10-CM

## 2018-04-13 DIAGNOSIS — M25551 Pain in right hip: Secondary | ICD-10-CM

## 2018-04-23 ENCOUNTER — Telehealth (INDEPENDENT_AMBULATORY_CARE_PROVIDER_SITE_OTHER): Payer: Self-pay

## 2018-04-23 ENCOUNTER — Ambulatory Visit (INDEPENDENT_AMBULATORY_CARE_PROVIDER_SITE_OTHER): Payer: PRIVATE HEALTH INSURANCE | Admitting: Orthopaedic Surgery

## 2018-04-23 ENCOUNTER — Encounter (INDEPENDENT_AMBULATORY_CARE_PROVIDER_SITE_OTHER): Payer: Self-pay | Admitting: Orthopaedic Surgery

## 2018-04-23 DIAGNOSIS — M1712 Unilateral primary osteoarthritis, left knee: Secondary | ICD-10-CM

## 2018-04-23 DIAGNOSIS — M4807 Spinal stenosis, lumbosacral region: Secondary | ICD-10-CM

## 2018-04-23 DIAGNOSIS — M25551 Pain in right hip: Secondary | ICD-10-CM | POA: Diagnosis not present

## 2018-04-23 DIAGNOSIS — M25511 Pain in right shoulder: Secondary | ICD-10-CM

## 2018-04-23 DIAGNOSIS — G8929 Other chronic pain: Secondary | ICD-10-CM

## 2018-04-23 DIAGNOSIS — M542 Cervicalgia: Secondary | ICD-10-CM | POA: Diagnosis not present

## 2018-04-23 DIAGNOSIS — M25562 Pain in left knee: Secondary | ICD-10-CM

## 2018-04-23 NOTE — Telephone Encounter (Signed)
Left knee euflexxa

## 2018-04-23 NOTE — Progress Notes (Signed)
Office Visit Note   Patient: Sharon [name]           Date of Birth: Sep 23, 1965           MRN: [id number] Visit Date: 04/23/2018              Requested by: Forrest Moron, MD [place], [name] [address] PCP: Forrest Moron, MD   Assessment & Plan: Visit Diagnoses:  1. Chronic right shoulder pain   2. Spinal stenosis of lumbosacral region   3. Neck pain   4. Pain in right hip   5. Chronic pain of left knee   6. Unilateral primary osteoarthritis, left knee     Plan: As far as her left knee goes, we will order hyaluronic acid with Euflexxa which would be a series of 3 injections.  I do feel that she would benefit from a steroid injection in her right shoulder subacromial space in her right trochanteric area but we will hold off on these since she has surgery at this coming Thursday.  We will see her back for her Euflexxa injections we will provide injection in her right shoulder as well as her right trochanteric area.  She will consider at some point a right sided L4-L5 injection if she still symptomatic from that.  All question concerns were answered and addressed.  Follow-Up Instructions: Return in about 3 weeks (around 05/14/2018).   Orders:  No orders of the defined types were placed in this encounter.  No orders of the defined types were placed in this encounter.     Procedures: No procedures performed   Clinical Data: No additional findings.   Subjective: Chief Complaint  Patient presents with  . Left Knee - Follow-up  . Right Hip - Follow-up  . Lower Back - Follow-up  . Neck - Follow-up  The patient comes in today to go over 5 MRIs.  These were done of her cervical spine, her lumbar spine, her right shoulder, her right hip, and her left knee.  She has had a remote left knee arthroscopy before.  She is tried anti-inflammatories to help all these areas.  She is work on activity modification.  She is Artie lost a lot of weight and she is thin as it  is.  She did let me know that she is having some type of surgery on the recalled mesh in her abdominal/groin/GYN area on Thursday of this week.  She is not a diabetic.  Her right shoulder hurts with overhead activities.  The left knee aches all the time.  The right hip hurts in her back and over the trochanteric area.  Her neck is been stiff as well.  She denies any numbness and tingling in her hands or feet.  HPI  Review of Systems She currently denies any headache, chest pain, shortness of breath, fever, chills, nausea, vomiting.  Objective: Vital Signs: LMP 10/11/2010   Physical Exam She is alert and oriented 3 in no acute distress Ortho Exam Examination of her right shoulder does show signs of impingement with significant pain with overhead activities.  Her cervical spine moves well which is pain mainly in the trapezius area to the right side.  Her left knee shows no effusion but medial joint line tenderness with full range of motion.  She hurts to palpation of the trochanteric area of her right hip but not in the groin with good range of motion of her right hip.  She hurts in  the low back to the right side and down the IT band. Specialty Comments:  No specialty comments available.  Imaging: No results found. All 5 MRIs were reviewed independently reviewed extensively with her.  She does have significant tendinosis of the rotator cuff of her right shoulder but no tear.  Her left knee does show significant full-thickness cartilage loss of the medial compartment.  Her low back has a disc protrusion at L4-L5 to the right showing potentially some irritation of the L4 nerve.  Her cervical spine MRI shows some degenerative changes at multiple levels but no significant nerve impingement.  The MRI of her right hip shows no significant acute findings with just some mild arthritis.  PMFS History: Patient Active Problem List   Diagnosis Date Noted  . Chronic pain of left knee 03/27/2018  . Chronic  right shoulder pain 03/27/2018  . Cervicalgia 03/27/2018  . Status post arthroscopy of left knee 08/06/2017  . Unilateral primary osteoarthritis, left knee 08/06/2017  . Tremor 07/10/2017  . Other meniscus derangements, posterior horn of medial meniscus, left knee 06/28/2017  . Ureteral stone 05/23/2017  . History of sepsis 05/23/2017  . Acute pain of left knee 04/09/2017  . Trochanteric bursitis, right hip 09/27/2016  . GAD (generalized anxiety disorder) 05/31/2014  . Rectocele 08/21/2011  . Uterovaginal prolapse 08/21/2011  . Anal fissure 05/05/2011  . Dyspnea 10/13/2010  . Hypertension 10/13/2010   Past Medical History:  Diagnosis Date  . Allergy   . Anal fissure   . Anxiety   . Arthritis   . Blood clot in vein    left leg  . Detached retina    BOTH SCLERA BUCKLE BOTH EYES  . DVT (deep venous thrombosis) (HCC)    left leg  . Endometriosis   . Family history of adverse reaction to anesthesia    DAUGHTER POST OP PONV  . GERD (gastroesophageal reflux disease)   . Glaucoma    RIGHT WORST THAN LEFT  . Heart murmur   . History of hemorrhoids   . Hypertension   . IBS (irritable bowel syndrome)   . Perforated eardrum, right    SMALL HOLE  . PONV (postoperative nausea and vomiting)   . Pulmonary embolism ([place]) 6 YRS AGO    Family History  Problem Relation Age of Onset  . Emphysema Mother        smoker  . Allergies Mother   . Asthma Mother   . Heart disease Mother        AMI as cause of death  . COPD Mother   . Asthma Father   . Heart disease Father        AMI as cause of death  . Diabetes Father   . Asthma Brother   . Heart disease Brother 61       heart failure  . Lung cancer Maternal Grandfather        was a smoker  . Cancer Maternal Grandfather        lung  . Heart disease Paternal Grandmother   . Heart disease Paternal Grandfather   . Colon cancer Neg Hx     Past Surgical History:  Procedure Laterality Date  . ABDOMINAL HYSTERECTOMY    . CATARACT  EXTRACTION Bilateral 2015  . CYSTOSCOPY W/ URETERAL STENT PLACEMENT Right 05/23/2017   Procedure: CYSTOSCOPY WITH RETROGRADE PYELOGRAM/URETERAL RIGHT STENT PLACEMENT;  Surgeon: Bjorn Loser, MD;  Location: [facility];  Service: Urology;  Laterality: Right;  . CYSTOSCOPY W/ URETERAL STENT  PLACEMENT Right 06/04/2017   Procedure: CYSTOSCOPY WITHRIGHT RETROGRADE PYELOGRAMRIGHT /URETEREOSCOPY  STENT PLACEMENT;  Surgeon: Lucas Mallow, MD;  [facility];  Service: Urology;  Laterality: Right;  . ENDOMETRIAL ABLATION    . [place]  2011  . INCONTINENCE SURGERY    . INNER EAR SURGERY     X 2  . KNEE SURGERY    . RECTAL PROLAPSE REPAIR    . RETINAL DETACHMENT SURGERY Bilateral 2000  . RETINAL DETACHMENT SURGERY Bilateral   . TUBAL LIGATION    . VEIN SURGERY Left 04/2010   Social History   Occupational History  . Occupation: Restaurant manager, fast food: [employer]  Tobacco Use  . Smoking status: Never Smoker  . Smokeless tobacco: Never Used  Substance and Sexual Activity  . Alcohol use: Yes    Frequency: Never    Comment: occ  . Drug use: No    Comment: former maryjuana use- quit in 1995  . Sexual activity: Yes

## 2018-04-26 NOTE — Telephone Encounter (Signed)
Noted  

## 2018-04-30 ENCOUNTER — Telehealth (INDEPENDENT_AMBULATORY_CARE_PROVIDER_SITE_OTHER): Payer: Self-pay

## 2018-04-30 NOTE — Telephone Encounter (Signed)
Submitted VOB for Euflexxa series, left knee.

## 2018-05-01 ENCOUNTER — Telehealth (INDEPENDENT_AMBULATORY_CARE_PROVIDER_SITE_OTHER): Payer: Self-pay

## 2018-05-01 NOTE — Telephone Encounter (Signed)
Patient is approved for Euflexxa series, left knee. Buy & Bill Covered at 100% of the allowable amount. No Co-pay No PA required  Appt. 05/14/2018 with Dr. Ninfa Linden

## 2018-05-14 ENCOUNTER — Ambulatory Visit (INDEPENDENT_AMBULATORY_CARE_PROVIDER_SITE_OTHER): Payer: PRIVATE HEALTH INSURANCE | Admitting: Orthopaedic Surgery

## 2018-05-14 ENCOUNTER — Encounter (INDEPENDENT_AMBULATORY_CARE_PROVIDER_SITE_OTHER): Payer: Self-pay | Admitting: Orthopaedic Surgery

## 2018-05-14 DIAGNOSIS — M1712 Unilateral primary osteoarthritis, left knee: Secondary | ICD-10-CM | POA: Diagnosis not present

## 2018-05-14 MED ORDER — SODIUM HYALURONATE (VISCOSUP) 20 MG/2ML IX SOSY
20.0000 mg | PREFILLED_SYRINGE | INTRA_ARTICULAR | Status: AC | PRN
  Administered 2018-05-14: 20 mg via INTRA_ARTICULAR

## 2018-05-14 NOTE — Progress Notes (Signed)
   Procedure Note  Patient: Sharon [name]             Date of Birth: Dec 02, 1965           MRN: [id number]             Visit Date: 05/14/2018 HPI: Sharon [name] returns today for her first Euflexxa injection left knee.  She is recovering from recent mesh surgery of the abdomen.  She has known osteoarthritis of the left knee.  She is asking about getting epidural steroid injections in lumbar spine and also a steroid injection in her right shoulder.  Physical exam: Left knee no effusion abnormal warmth or erythema.  Overall good range of motion of the knee.  Tenderness along medial joint line.  Procedures: Visit Diagnoses: Unilateral primary osteoarthritis, left knee  Large Joint Inj: L knee on 05/14/2018 9:36 AM Indications: pain Details: 22 G 1.5 in needle, anterolateral approach  Arthrogram: No  Medications: 20 mg Sodium Hyaluronate 20 MG/2ML Outcome: tolerated well, no immediate complications Procedure, treatment alternatives, risks and benefits explained, specific risks discussed. Consent was given by the patient. Immediately prior to procedure a time out was called to verify the correct patient, procedure, equipment, support staff and site/side marked as required. Patient was prepped and draped in the usual sterile fashion.     Plan: We will see her back in a week for her second of 3 Euflexxa injections left knee.  Recommend that she wait at least 6 weeks time of surgery prior to having any type of steroid injection.  Questions were encouraged and answered.  Patient tolerated Euflexxa injection well today.

## 2018-05-22 ENCOUNTER — Ambulatory Visit (INDEPENDENT_AMBULATORY_CARE_PROVIDER_SITE_OTHER): Payer: PRIVATE HEALTH INSURANCE | Admitting: Orthopaedic Surgery

## 2018-05-22 ENCOUNTER — Encounter (INDEPENDENT_AMBULATORY_CARE_PROVIDER_SITE_OTHER): Payer: Self-pay | Admitting: Orthopaedic Surgery

## 2018-05-22 DIAGNOSIS — M1712 Unilateral primary osteoarthritis, left knee: Secondary | ICD-10-CM

## 2018-05-22 DIAGNOSIS — M25511 Pain in right shoulder: Secondary | ICD-10-CM

## 2018-05-22 DIAGNOSIS — G8929 Other chronic pain: Secondary | ICD-10-CM

## 2018-05-22 MED ORDER — SODIUM HYALURONATE (VISCOSUP) 20 MG/2ML IX SOSY
20.0000 mg | PREFILLED_SYRINGE | INTRA_ARTICULAR | Status: AC | PRN
  Administered 2018-05-22: 20 mg via INTRA_ARTICULAR

## 2018-05-22 NOTE — Progress Notes (Signed)
   Procedure Note  Patient: Sharon [name]             Date of Birth: [birth year]           MRN: [id number]             Visit Date: 05/22/2018  Procedures: Visit Diagnoses: Unilateral primary osteoarthritis, left knee  Chronic right shoulder pain  Large Joint Inj on 05/22/2018 9:08 AM Indications: diagnostic evaluation and pain Details: 22 G 1.5 in needle, superolateral approach  Arthrogram: No  Medications: 20 mg Sodium Hyaluronate 20 MG/2ML Outcome: tolerated well, no immediate complications Procedure, treatment alternatives, risks and benefits explained, specific risks discussed. Consent was given by the patient. Immediately prior to procedure a time out was called to verify the correct patient, procedure, equipment, support staff and site/side marked as required. Patient was prepped and draped in the usual sterile fashion.     The patient is here today for #2 of a series of 3 hyaluronic acid injections with Euflexxa.  This is done over 3-week period.  She tolerated injection 1 well without any adverse effects.  She is here for injection #2 of Euflexxa.  She tolerated the second injection well.  She will come back next week for injection #3.  This is to treat the pain from osteoarthritis in the knee.  This is for her left knee.  Also next week we will consider a right shoulder sub-acromial steroid injection.

## 2018-05-23 ENCOUNTER — Telehealth: Payer: Self-pay | Admitting: Family Medicine

## 2018-05-23 NOTE — Telephone Encounter (Signed)
°  Called and spoke with pt regarding rescheduling of appt with Dr. Nolon Rod on 07/04/18. Due to provider schedule change, pt was rescheduled to 07/04/18 at 9:40. I advised pt of time, building and late policy. Pt acknowledged.

## 2018-05-29 ENCOUNTER — Ambulatory Visit (INDEPENDENT_AMBULATORY_CARE_PROVIDER_SITE_OTHER): Payer: PRIVATE HEALTH INSURANCE | Admitting: Orthopaedic Surgery

## 2018-05-29 ENCOUNTER — Other Ambulatory Visit (INDEPENDENT_AMBULATORY_CARE_PROVIDER_SITE_OTHER): Payer: Self-pay

## 2018-05-29 ENCOUNTER — Encounter (INDEPENDENT_AMBULATORY_CARE_PROVIDER_SITE_OTHER): Payer: Self-pay | Admitting: Orthopaedic Surgery

## 2018-05-29 DIAGNOSIS — G8929 Other chronic pain: Secondary | ICD-10-CM

## 2018-05-29 DIAGNOSIS — M25511 Pain in right shoulder: Secondary | ICD-10-CM | POA: Diagnosis not present

## 2018-05-29 DIAGNOSIS — M5441 Lumbago with sciatica, right side: Secondary | ICD-10-CM

## 2018-05-29 DIAGNOSIS — M1712 Unilateral primary osteoarthritis, left knee: Secondary | ICD-10-CM

## 2018-05-29 MED ORDER — LIDOCAINE HCL 1 % IJ SOLN
3.0000 mL | INTRAMUSCULAR | Status: AC | PRN
  Administered 2018-05-29: 3 mL

## 2018-05-29 MED ORDER — METHYLPREDNISOLONE ACETATE 40 MG/ML IJ SUSP
40.0000 mg | INTRAMUSCULAR | Status: AC | PRN
  Administered 2018-05-29: 40 mg via INTRA_ARTICULAR

## 2018-05-29 MED ORDER — SODIUM HYALURONATE (VISCOSUP) 20 MG/2ML IX SOSY
20.0000 mg | PREFILLED_SYRINGE | INTRA_ARTICULAR | Status: AC | PRN
  Administered 2018-05-29: 20 mg via INTRA_ARTICULAR

## 2018-05-29 NOTE — Progress Notes (Signed)
   Procedure Note  Patient: Sharon [name]             Date of Birth: November 23, 1965           MRN: [id number]             Visit Date: 05/29/2018  Procedures: Visit Diagnoses: Unilateral primary osteoarthritis, left knee  Chronic right shoulder pain  Large Joint Inj on 05/29/2018 9:29 AM Indications: diagnostic evaluation and pain Details: 22 G 1.5 in needle, superolateral approach  Arthrogram: No  Medications: 20 mg Sodium Hyaluronate 20 MG/2ML Outcome: tolerated well, no immediate complications Procedure, treatment alternatives, risks and benefits explained, specific risks discussed. Consent was given by the patient. Immediately prior to procedure a time out was called to verify the correct patient, procedure, equipment, support staff and site/side marked as required. Patient was prepped and draped in the usual sterile fashion.   Large Joint Inj: R subacromial bursa on 05/29/2018 9:39 AM Indications: pain and diagnostic evaluation Details: 22 G 1.5 in needle  Arthrogram: No  Medications: 3 mL lidocaine 1 %; 40 mg methylPREDNISolone acetate 40 MG/ML Outcome: tolerated well, no immediate complications Procedure, treatment alternatives, risks and benefits explained, specific risks discussed. Consent was given by the patient. Immediately prior to procedure a time out was called to verify the correct patient, procedure, equipment, support staff and site/side marked as required. Patient was prepped and draped in the usual sterile fashion.    The patient is here today for injection #3 and a series of 3 Euflexxa injections to treat the pain from osteoarthritis in the left knee.  She also would like to have a right shoulder steroid injection due to significant impingement syndrome in the right shoulder.  She understands the risk and benefits of both these injections.  She tolerated the hyaluronic acid injections with Euflexxa No. 1 and #2 in her left knee thus far.  She also has a history  of low back pain and an MRI recently showed a foraminal disc protrusion the right sided L4-L5.  She would like to be considered for an epidural steroid injection.  On exam she appears comfortable and in no acute distress.  Her left knee shows no effusion.  Her right shoulder hurts with overhead activities.  She does have pain in the lower lumbar spine to the right and left side with more of a radicular symptoms going to the right side.  There is subjective numbness on the right side of her leg and foot.  She tolerated #3 of the 3 Euflexxa injections.  She understands for her knees have no further recommendations other than continuing to strengthen her quad muscles.  She tolerated the steroid injection her right shoulder subacromial space as well.  She will continue to work on mobility and range of motion of her right shoulder.  She is interested in an intervention in her spine so I will send her to Dr. Ernestina Patches to consider a right sided injection at L4-L5 there correlates with her MRI findings and hopefully her symptoms.  I will see her back myself in 2 months.

## 2018-06-24 ENCOUNTER — Ambulatory Visit (INDEPENDENT_AMBULATORY_CARE_PROVIDER_SITE_OTHER): Payer: Self-pay

## 2018-06-24 ENCOUNTER — Encounter

## 2018-06-24 ENCOUNTER — Ambulatory Visit (INDEPENDENT_AMBULATORY_CARE_PROVIDER_SITE_OTHER): Payer: PRIVATE HEALTH INSURANCE | Admitting: Physical Medicine and Rehabilitation

## 2018-06-24 ENCOUNTER — Encounter (INDEPENDENT_AMBULATORY_CARE_PROVIDER_SITE_OTHER): Payer: Self-pay | Admitting: Physical Medicine and Rehabilitation

## 2018-06-24 VITALS — BP 106/71 | HR 62 | Temp 98.6°F

## 2018-06-24 DIAGNOSIS — M5416 Radiculopathy, lumbar region: Secondary | ICD-10-CM

## 2018-06-24 DIAGNOSIS — M48061 Spinal stenosis, lumbar region without neurogenic claudication: Secondary | ICD-10-CM

## 2018-06-24 MED ORDER — BETAMETHASONE SOD PHOS & ACET 6 (3-3) MG/ML IJ SUSP
12.0000 mg | Freq: Once | INTRAMUSCULAR | Status: AC
  Administered 2018-06-24: 12 mg

## 2018-06-24 NOTE — Patient Instructions (Signed)

## 2018-06-24 NOTE — Progress Notes (Signed)
Sharon [name] - 53 y.o. female MRN [id number]  Date of birth: [birth year]  Office Visit Note: Visit Date: 06/24/2018 PCP: Forrest Moron, MD Referred by: Forrest Moron, MD  Subjective: Chief Complaint  Patient presents with  . Lower Back - Pain   HPI:  Sharon [name] is a 53 y.o. female who comes in today At the request of Dr. Jean Rosenthal for right L50for transforaminal steroid injection.  Patient has foraminal narrowing with the right hip pain which is posterior lateral buttock pain across the greater trochanter to the upper thigh.  Nothing past the leg at this point but has had that in the past.  No paresthesias.  Has failed conservative care otherwise.  He has treated her for her shoulder as well as knees in the past.  She has not had prior epidural injection or any back surgeries.  No red flag complaints.  We will complete diagnostic note for therapeutic right L4 transforaminal injection.  ROS Otherwise per HPI.  Assessment & Plan: Visit Diagnoses:  1. Lumbar radiculopathy   2. Foraminal stenosis of lumbar region     Plan: No additional findings.   Meds & Orders:  Meds ordered this encounter  Medications  . betamethasone acetate-betamethasone sodium phosphate (CELESTONE) injection 12 mg    Orders Placed This Encounter  Procedures  . XR C-ARM NO REPORT  . Epidural Steroid injection    Follow-up: Return if symptoms worsen or fail to improve, for Dr. Ninfa Linden.   Procedures: No procedures performed  Lumbosacral Transforaminal Epidural Steroid Injection - Sub-Pedicular Approach with Fluoroscopic Guidance  Patient: Sharon [name]      Date of Birth: July 08, 1965 MRN: [id number] PCP: Forrest Moron, MD      Visit Date: 06/24/2018   Universal Protocol:    Date/Time: 06/24/2018  Consent Given By: the patient  Position: PRONE  Additional Comments: Vital signs were monitored before and after the procedure. Patient was prepped and draped in  the usual sterile fashion. The correct patient, procedure, and site was verified.   Injection Procedure Details:  Procedure Site One Meds Administered:  Meds ordered this encounter  Medications  . betamethasone acetate-betamethasone sodium phosphate (CELESTONE) injection 12 mg    Laterality: Right  Location/Site:  L4-L5  Needle size: 22 G  Needle type: Spinal  Needle Placement: Transforaminal  Findings:    -Comments: Excellent flow of contrast along the nerve and into the epidural space.  Procedure Details: After squaring off the end-plates to get a true AP view, the C-arm was positioned so that an oblique view of the foramen as noted above was visualized. The target area is just inferior to the "nose of the scotty dog" or sub pedicular. The soft tissues overlying this structure were infiltrated with 2-3 ml. of 1% Lidocaine without Epinephrine.  The spinal needle was inserted toward the target using a "trajectory" view along the fluoroscope beam.  Under AP and lateral visualization, the needle was advanced so it did not puncture dura and was located close the 6 O'Clock position of the pedical in AP tracterory. Biplanar projections were used to confirm position. Aspiration was confirmed to be negative for CSF and/or blood. A 1-2 ml. volume of Isovue-250 was injected and flow of contrast was noted at each level. Radiographs were obtained for documentation purposes.   After attaining the desired flow of contrast documented above, a 0.5 to 1.0 ml test dose of 0.25% Marcaine was injected into each respective transforaminal space.  The  patient was observed for 90 seconds post injection.  After no sensory deficits were reported, and normal lower extremity motor function was noted,   the above injectate was administered so that equal amounts of the injectate were placed at each foramen (level) into the transforaminal epidural space.   Additional Comments:  The patient tolerated the  procedure well Dressing: Band-Aid    Post-procedure details: Patient was observed during the procedure. Post-procedure instructions were reviewed.  Patient left the clinic in stable condition.     Clinical History: MRI LUMBAR SPINE WITHOUT CONTRAST  TECHNIQUE: Multiplanar, multisequence MR imaging of the lumbar spine was performed. No intravenous contrast was administered.  COMPARISON:  CT abdomen pelvis 01/30/2018  FINDINGS: Segmentation: Normal. The lowest disc space is considered to be L5-S1.  Alignment:  Normal  Vertebrae: No acute compression fracture, discitis-osteomyelitis of focal marrow lesion.  Conus medullaris and cauda equina: The conus medullaris terminates at the L2 level. The cauda equina and conus medullaris are both normal.  Paraspinal and other soft tissues: The visualized aorta, IVC and iliac vessels are normal. The visualized retroperitoneal organs and paraspinal soft tissues are normal.  Disc levels: Sagittal plane imaging includes the T11-12 disc level through the upper sacrum, with axial imaging of the T12-L1 to L5-S1 disc levels.  The T11-L3 levels are normal.  L3-L4: Mild disc desiccation.  No herniation or stenosis.  L4-L5: Mild facet hypertrophy. Small right foraminal protrusion with mild right foraminal stenosis. There is mild narrowing of the right lateral recess. No left foraminal stenosis or central spinal canal stenosis.  L5-S1: Left-greater-than-right to moderate facet hypertrophy. Small central disc protrusion with annular fissure. No stenosis.  The visualized portion of the sacrum is normal.  IMPRESSION: 1. L4-L5 right foraminal disc protrusion mildly narrowing the right neural foramen and serving as a possible source of right L4 nerve root irritation. 2. L5-S1 moderate facet arthrosis and small central protrusion with annular fissure. These findings may serve as a source of local low back pain. No spinal  canal stenosis or neural impingement.   Electronically Signed   By: Ulyses Jarred M.D.   On: 04/13/2018 18:55     Objective:  VS:  HT:    WT:   BMI:     BP:106/71  HR:62bpm  TEMP:98.6 F (37 C)(Oral)  RESP:  Physical Exam  Ortho Exam Imaging: Xr C-arm No Report  Result Date: 06/24/2018 Please see Notes tab for imaging impression.

## 2018-06-24 NOTE — Progress Notes (Signed)
 .  Numeric Pain Rating Scale and Functional Assessment Average Pain 9   In the last MONTH (on 0-10 scale) has pain interfered with the following?  1. General activity like being  able to carry out your everyday physical activities such as walking, climbing stairs, carrying groceries, or moving a chair?  Rating(5)   +Driver, -BT, -Dye Allergies.  

## 2018-06-24 NOTE — Procedures (Signed)
Lumbosacral Transforaminal Epidural Steroid Injection - Sub-Pedicular Approach with Fluoroscopic Guidance  Patient: Sharon [name]      Date of Birth: 1965-11-24 MRN: [id number] PCP: Forrest Moron, MD      Visit Date: 06/24/2018   Universal Protocol:    Date/Time: 06/24/2018  Consent Given By: the patient  Position: PRONE  Additional Comments: Vital signs were monitored before and after the procedure. Patient was prepped and draped in the usual sterile fashion. The correct patient, procedure, and site was verified.   Injection Procedure Details:  Procedure Site One Meds Administered:  Meds ordered this encounter  Medications  . betamethasone acetate-betamethasone sodium phosphate (CELESTONE) injection 12 mg    Laterality: Right  Location/Site:  L4-L5  Needle size: 22 G  Needle type: Spinal  Needle Placement: Transforaminal  Findings:    -Comments: Excellent flow of contrast along the nerve and into the epidural space.  Procedure Details: After squaring off the end-plates to get a true AP view, the C-arm was positioned so that an oblique view of the foramen as noted above was visualized. The target area is just inferior to the "nose of the scotty dog" or sub pedicular. The soft tissues overlying this structure were infiltrated with 2-3 ml. of 1% Lidocaine without Epinephrine.  The spinal needle was inserted toward the target using a "trajectory" view along the fluoroscope beam.  Under AP and lateral visualization, the needle was advanced so it did not puncture dura and was located close the 6 O'Clock position of the pedical in AP tracterory. Biplanar projections were used to confirm position. Aspiration was confirmed to be negative for CSF and/or blood. A 1-2 ml. volume of Isovue-250 was injected and flow of contrast was noted at each level. Radiographs were obtained for documentation purposes.   After attaining the desired flow of contrast documented above, a  0.5 to 1.0 ml test dose of 0.25% Marcaine was injected into each respective transforaminal space.  The patient was observed for 90 seconds post injection.  After no sensory deficits were reported, and normal lower extremity motor function was noted,   the above injectate was administered so that equal amounts of the injectate were placed at each foramen (level) into the transforaminal epidural space.   Additional Comments:  The patient tolerated the procedure well Dressing: Band-Aid    Post-procedure details: Patient was observed during the procedure. Post-procedure instructions were reviewed.  Patient left the clinic in stable condition.

## 2018-06-28 ENCOUNTER — Other Ambulatory Visit: Payer: Self-pay | Admitting: Family Medicine

## 2018-06-28 NOTE — Telephone Encounter (Signed)
Requested medication (s) are due for refill today: yes  Requested medication (s) are on the active medication list: yes  Last refill:  05/31/2018  Future visit scheduled: yes  Notes to clinic:  Not delegated    Requested Prescriptions  Pending Prescriptions Disp Refills   primidone (MYSOLINE) 50 MG tablet [Pharmacy Med Name: PRIMIDONE 50 MG TABLET] 60 tablet 5    Sig: TAKE 2 TABLETS (100 MG TOTAL) BY MOUTH AT BEDTIME.     Not Delegated - Neurology:  Anticonvulsants Failed - 06/28/2018  2:18 AM      Failed - This refill cannot be delegated      Passed - HCT in normal range and within 360 days    Hematocrit  Date Value Ref Range Status  01/01/2018 36.0 34.0 - 46.6 % Final         Passed - HGB in normal range and within 360 days    Hemoglobin  Date Value Ref Range Status  01/01/2018 13.0 11.1 - 15.9 g/dL Final         Passed - PLT in normal range and within 360 days    Platelets  Date Value Ref Range Status  01/01/2018 272 150 - 450 x10E3/uL Final   Platelet Count, POC  Date Value Ref Range Status  10/21/2015 288 142 - 424 K/uL Final         Passed - WBC in normal range and within 360 days    WBC  Date Value Ref Range Status  01/01/2018 3.9 3.4 - 10.8 x10E3/uL Final  08/17/2017 7.5 4.0 - 10.5 K/uL Final         Passed - Valid encounter within last 12 months    Recent Outpatient Visits          5 months ago Gastroesophageal reflux disease without esophagitis   [facility] [place], [place]   12 months ago Essential hypertension   [facility] at [facility], [place] Butters, [place]   1 year ago Essential hypertension   [facility] at [facility], [place]ette Butters, [place]   2 years ago Right elbow pain   [facility] at [facility], [place]ette Butters, [place]   2 years ago Shortness of breath   [facility] at [facility], Renette Butters, [place]      Future Appointments            In 6 days Forrest Moron, MD [facility] [place], [facility]   In 1 month Mcarthur Rossetti, MD  [facility]

## 2018-07-04 ENCOUNTER — Ambulatory Visit: Payer: PRIVATE HEALTH INSURANCE | Admitting: Family Medicine

## 2018-07-04 ENCOUNTER — Encounter: Payer: Self-pay | Admitting: Family Medicine

## 2018-07-04 VITALS — BP 118/76 | HR 66 | Temp 98.6°F | Resp 16 | Ht 67.5 in | Wt 140.2 lb

## 2018-07-04 DIAGNOSIS — G250 Essential tremor: Secondary | ICD-10-CM

## 2018-07-04 DIAGNOSIS — R8290 Unspecified abnormal findings in urine: Secondary | ICD-10-CM

## 2018-07-04 DIAGNOSIS — I10 Essential (primary) hypertension: Secondary | ICD-10-CM

## 2018-07-04 DIAGNOSIS — F411 Generalized anxiety disorder: Secondary | ICD-10-CM

## 2018-07-04 DIAGNOSIS — L299 Pruritus, unspecified: Secondary | ICD-10-CM

## 2018-07-04 DIAGNOSIS — Z5181 Encounter for therapeutic drug level monitoring: Secondary | ICD-10-CM

## 2018-07-04 DIAGNOSIS — K219 Gastro-esophageal reflux disease without esophagitis: Secondary | ICD-10-CM

## 2018-07-04 LAB — POCT URINALYSIS DIP (MANUAL ENTRY)
GLUCOSE UA: NEGATIVE mg/dL
Nitrite, UA: NEGATIVE
Protein Ur, POC: 30 mg/dL — AB
Urobilinogen, UA: 0.2 E.U./dL
pH, UA: 5.5 (ref 5.0–8.0)

## 2018-07-04 LAB — POCT WET + KOH PREP
TRICH BY WET PREP: ABSENT
Yeast by KOH: ABSENT
Yeast by wet prep: ABSENT

## 2018-07-04 MED ORDER — EPINEPHRINE 0.3 MG/0.3ML IJ SOAJ: 0.3000 mg | Freq: Once | INTRAMUSCULAR | 1 refills | Status: DC | PRN

## 2018-07-04 MED ORDER — ONDANSETRON HCL 8 MG PO TABS: 8.0000 mg | ORAL_TABLET | Freq: Three times a day (TID) | ORAL | 0 refills | Status: DC | PRN

## 2018-07-04 MED ORDER — CLONAZEPAM 0.5 MG PO TABS: 0.5000 mg | ORAL_TABLET | Freq: Every day | ORAL | 5 refills | Status: DC

## 2018-07-04 MED ORDER — PANTOPRAZOLE SODIUM 40 MG PO TBEC: 40.0000 mg | DELAYED_RELEASE_TABLET | Freq: Every day | ORAL | 3 refills | Status: DC

## 2018-07-04 MED ORDER — PRIMIDONE 50 MG PO TABS: ORAL_TABLET | ORAL | 1 refills | Status: DC

## 2018-07-04 MED ORDER — NEBIVOLOL HCL 5 MG PO TABS: 5.0000 mg | ORAL_TABLET | Freq: Every day | ORAL | 1 refills | Status: DC

## 2018-07-04 NOTE — Progress Notes (Signed)
Established Patient Office Visit  Subjective:  Patient ID: Sharon [name], female    DOB: [birth year]  Age: 53 y.o. MRN: [id number]  CC:  Chief Complaint  Patient presents with  . medication refill    Celecoxib 200 mg, conaazepam 0.5 mg, Epi Pen 0.3 mg, Bystolic 5 mg, Zofran 8 mg, Protonix 40 mg, Primidone Mysoline 50 mg, Ranitidine 150 mg    HPI [employer] presents for   Patient is here for medication refills  She has allergies to meds and needs to have her EPI pen refilled because it has expired.  Hypertension: Patient here for follow-up of elevated blood pressure. She is not exercising and is adherent to low salt diet.  Blood pressure is well controlled at home. Cardiac symptoms none. Patient denies chest pressure/discomfort, claudication, dyspnea, exertional chest pressure/discomfort, fatigue, irregular heart beat and lower extremity edema.  Cardiovascular risk factors: hypertension. Use of agents associated with hypertension: NSAIDS. History of target organ damage: none.  BP Readings from Last 3 Encounters:  07/04/18 118/76  06/24/18 106/71  01/01/18 110/62    Essential tremor Reports that she is on a beta blocker and primidone which is for her tremors. She finds that it is more manageable with the medication. She reports that klonopin helps her tremors as well.  No vocal tremor No leakage of urine  GERD and nausea She reports that she needs refills of her protonix. She also takes prn zofran for her nausea. She avoid food triggers but reports having a sensitive stomach.  GAD She reports that she had mesh repair and appts with urology She reports that she was very worried about her body  She takes the clonazepam at bedtime   Past Medical History:  Diagnosis Date  . Allergy   . Anal fissure   . Anxiety   . Arthritis   . Blood clot in vein    left leg  . Detached retina    BOTH SCLERA BUCKLE BOTH EYES  . DVT (deep venous thrombosis) (HCC)    left  leg  . Endometriosis   . Family history of adverse reaction to anesthesia    DAUGHTER POST OP PONV  . GERD (gastroesophageal reflux disease)   . Glaucoma    RIGHT WORST THAN LEFT  . Heart murmur   . History of hemorrhoids   . Hypertension   . IBS (irritable bowel syndrome)   . Perforated eardrum, right    SMALL HOLE  . PONV (postoperative nausea and vomiting)   . Pulmonary embolism ([place]) 6 YRS AGO    Past Surgical History:  Procedure Laterality Date  . ABDOMINAL HYSTERECTOMY    . CATARACT EXTRACTION Bilateral 2015  . CYSTOSCOPY W/ URETERAL STENT PLACEMENT Right 05/23/2017   Procedure: CYSTOSCOPY WITH RETROGRADE PYELOGRAM/URETERAL RIGHT STENT PLACEMENT;  Surgeon: Bjorn Loser, MD;  Location: [facility];  Service: Urology;  Laterality: Right;  . CYSTOSCOPY W/ URETERAL STENT PLACEMENT Right 06/04/2017   Procedure: CYSTOSCOPY WITHRIGHT RETROGRADE PYELOGRAMRIGHT /URETEREOSCOPY  STENT PLACEMENT;  Surgeon: Lucas Mallow, MD;  Location: [facility];  Service: Urology;  Laterality: Right;  . ENDOMETRIAL ABLATION    . [place]  2011  . INCONTINENCE SURGERY    . INNER EAR SURGERY     X 2  . KNEE SURGERY    . RECTAL PROLAPSE REPAIR    . RETINAL DETACHMENT SURGERY Bilateral 2000  . RETINAL DETACHMENT SURGERY Bilateral   . TUBAL LIGATION    . VEIN SURGERY  Left 04/2010    Family History  Problem Relation Age of Onset  . Emphysema Mother        smoker  . Allergies Mother   . Asthma Mother   . Heart disease Mother        AMI as cause of death  . COPD Mother   . Asthma Father   . Heart disease Father        AMI as cause of death  . Diabetes Father   . Asthma Brother   . Heart disease Brother 29       heart failure  . Lung cancer Maternal Grandfather        was a smoker  . Cancer Maternal Grandfather        lung  . Heart disease Paternal Grandmother   . Heart disease Paternal Grandfather   . Colon cancer Neg Hx     Social History    Socioeconomic History  . Marital status: Divorced    Spouse name: Not on file  . Number of children: 1  . Years of education: Not on file  . Highest education level: Not on file  Occupational History  . Occupation: Restaurant manager, fast food: [employer]  Social Needs  . Financial resource strain: Not on file  . Food insecurity:    Worry: Not on file    Inability: Not on file  . Transportation needs:    Medical: Not on file    Non-medical: Not on file  Tobacco Use  . Smoking status: Never Smoker  . Smokeless tobacco: Never Used  Substance and Sexual Activity  . Alcohol use: Yes    Frequency: Never    Comment: occ  . Drug use: No    Comment: former maryjuana use- quit in 1995  . Sexual activity: Yes  Lifestyle  . Physical activity:    Days per week: Not on file    Minutes per session: Not on file  . Stress: Not on file  Relationships  . Social connections:    Talks on phone: Not on file    Gets together: Not on file    Attends religious service: Not on file    Active member of club or organization: Not on file    Attends meetings of clubs or organizations: Not on file    Relationship status: Not on file  . Intimate partner violence:    Fear of current or ex partner: Not on file    Emotionally abused: Not on file    Physically abused: Not on file    Forced sexual activity: Not on file  Other Topics Concern  . Not on file  Social History Narrative   Marital status: divorced x 2; dating seriously x 3 years.  Boyfriend with HIV.       Children:  1 daughter (67); no grandchildren      Lives:  With boyfriend.        Employment: works at [employer]      Tobacco: none      Alcohol: rarely; socially      Exercise: none; has treadmill and elliptical and bikes    Outpatient Medications Prior to Visit  Medication Sig Dispense Refill  . acetaminophen (TYLENOL) 500 MG tablet Take 500 mg by mouth 2 (two) times daily.    Marland Kitchen acyclovir (ZOVIRAX) 400 MG tablet  Take 1 tablet (400 mg total) by mouth at bedtime. 90 tablet 3  . Ascorbic Acid (VITAMIN  C) 1000 MG tablet Take 1,000 mg by mouth daily.    . celecoxib (CELEBREX) 200 MG capsule Take 1 capsule (200 mg total) by mouth daily. (Patient taking differently: Take 200 mg by mouth daily as needed (For pain.). ) 90 capsule 1  . Cholecalciferol (VITAMIN D) 2000 UNITS tablet Take 2,000 Units by mouth daily.    Marland Kitchen conjugated estrogens (PREMARIN) vaginal cream Insert pea-sized amount into vagina every other night    . ibuprofen (ADVIL,MOTRIN) 200 MG tablet Take by mouth.    . Magnesium 500 MG CAPS Take 500 mg by mouth daily.     . Magnesium Oxide, Antacid, 500 MG CAPS Take by mouth.    . Nitroglycerin (RECTIV) 0.4 % OINT Place 0.4 inches rectally 2 (two) times daily as needed (For anal fissures.). 30 g 1  . Polyethylene Glycol 3350 (MIRALAX PO) Take 17 g by mouth daily as needed (For constipation.).     Marland Kitchen PREMARIN vaginal cream   3  . ranitidine (ZANTAC) 150 MG tablet Take 2 tablets in the morning as needed for reflux 180 tablet 1  . traMADol (ULTRAM) 50 MG tablet Take by mouth.    . Travoprost, BAK Free, (TRAVATAN Z) 0.004 % SOLN ophthalmic solution Place 1 drop into both eyes at bedtime.     . vitamin B-12 (CYANOCOBALAMIN) 1000 MCG tablet Take 1,000 mcg by mouth daily.    . clonazePAM (KLONOPIN) 0.5 MG tablet Take 1 tablet (0.5 mg total) by mouth at bedtime. 30 tablet 5  . EPINEPHrine 0.3 mg/0.3 mL IJ SOAJ injection Inject 0.3 mg into the muscle once as needed (For anaphylaxis.).   1  . nebivolol (BYSTOLIC) 5 MG tablet Take 1 tablet (5 mg total) by mouth daily. 90 tablet 1  . ondansetron (ZOFRAN) 8 MG tablet Take 1 tablet (8 mg total) by mouth every 8 (eight) hours as needed for nausea or vomiting. 20 tablet 0  . pantoprazole (PROTONIX) 40 MG tablet Take 1 tablet (40 mg total) by mouth at bedtime. 90 tablet 3  . primidone (MYSOLINE) 50 MG tablet TAKE 2 TABLETS (100 MG TOTAL) BY MOUTH AT BEDTIME. 60 tablet 0   . diclofenac sodium (VOLTAREN) 1 % GEL Apply 2 g topically 4 (four) times daily. (Patient not taking: Reported on 07/04/2018) 100 g 3  . Multiple Vitamin (MULTI-VITAMINS) TABS Take by mouth.    . oxyCODONE-acetaminophen (PERCOCET/ROXICET) 5-325 MG tablet Take 1-2 tablets by mouth every 4 (four) hours as needed (For pain.).   0   No facility-administered medications prior to visit.     Allergies  Allergen Reactions  . Dextromethorphan Polistirex Er Other (See Comments)    Pt states caused "a lump" in her throat, making it difficult to swallow  . Hydrocodone Nausea And Vomiting  . Oxycodone-Acetaminophen Other (See Comments)    Made fingers tingle and go numb, started "feeling weird".   . Penicillins Other (See Comments)    Reaction unknown from childhood Has patient had a PCN reaction causing immediate rash, facial/tongue/throat swelling, SOB or lightheadedness with hypotension: unknown Has patient had a PCN reaction causing severe rash involving mucus membranes or skin necrosis: unknown  Has patient had a PCN reaction that required hospitalization: no Has patient had a PCN reaction occurring within the last 10 years: no If all of the above answers are "NO", then may proceed with Cephalosporin use.   . Tape Itching and Other (See Comments)    Bandaids - leaves red marks    ROS  Review of Systems See hpi +strong urine odor   Objective:    Physical Exam  BP 118/76 (BP Location: Left Arm, Patient Position: Sitting, Cuff Size: Normal)   Pulse 66   Temp 98.6 F (37 C) (Oral)   Resp 16   Ht 5' 7.5" (1.715 m)   Wt 140 lb 3.2 oz (63.6 kg)   LMP 10/11/2010   SpO2 97%   BMI 21.63 kg/m  Wt Readings from Last 3 Encounters:  07/04/18 140 lb 3.2 oz (63.6 kg)  01/01/18 141 lb 12.8 oz (64.3 kg)  08/17/17 158 lb (71.7 kg)   Physical Exam  Constitutional: Oriented to person, place, and time. Appears well-developed and well-nourished.  HENT:  Head: Normocephalic and atraumatic.   Eyes: Conjunctivae and EOM are normal.  Cardiovascular: Normal rate, regular rhythm, normal heart sounds and intact distal pulses.  No murmur heard. Pulmonary/Chest: Effort normal and breath sounds normal. No stridor. No respiratory distress. Has no wheezes.  Neurological: Is alert and oriented to person, place, and time.  Skin: Skin is warm. Capillary refill takes less than 2 seconds.  Psychiatric: Has a normal mood and affect. Behavior is normal. Judgment and thought content normal.    Health Maintenance Due  Topic Date Due  . PAP SMEAR-Modifier  01/03/2018  . INFLUENZA VACCINE  01/17/2018    There are no preventive care reminders to display for this patient.  Lab Results  Component Value Date   TSH 1.190 11/08/2016   Lab Results  Component Value Date   WBC 3.9 01/01/2018   HGB 13.0 01/01/2018   HCT 36.0 01/01/2018   MCV 87 01/01/2018   PLT 272 01/01/2018   Lab Results  Component Value Date   NA 142 01/01/2018   K 4.1 01/01/2018   CO2 23 01/01/2018   GLUCOSE 77 01/01/2018   BUN 20 01/01/2018   CREATININE 0.74 01/01/2018   BILITOT 0.2 01/01/2018   ALKPHOS 63 01/01/2018   AST 13 01/01/2018   ALT 8 01/01/2018   PROT 6.7 01/01/2018   ALBUMIN 4.6 01/01/2018   CALCIUM 9.6 01/01/2018   ANIONGAP 9 08/17/2017   GFR 108.07 03/09/2015   Lab Results  Component Value Date   CHOL 139 01/01/2018   Lab Results  Component Value Date   HDL 53 01/01/2018   Lab Results  Component Value Date   LDLCALC 73 01/01/2018   Lab Results  Component Value Date   TRIG 67 01/01/2018   Lab Results  Component Value Date   CHOLHDL 2.6 01/01/2018   Lab Results  Component Value Date   HGBA1C 5.2 01/01/2018      Assessment & Plan:   Problem List Items Addressed This Visit      Cardiovascular and Mediastinum   Hypertension - Primary   - refilled bp meds   Relevant Medications   nebivolol (BYSTOLIC) 5 MG tablet   EPINEPHrine 0.3 mg/0.3 mL IJ SOAJ injection   Other  Relevant Orders   Lipid panel   CMP14+EGFR     Other   GAD (generalized anxiety disorder)  - stable with counseling and stress mgmt   Relevant Medications   clonazePAM (KLONOPIN) 0.5 MG tablet   Other Relevant Orders   TSH    Other Visit Diagnoses    Itching       Relevant Orders   POCT Wet + KOH Prep (Completed)   Abnormal urine odor    - negative for uti   Relevant Orders   POCT urinalysis dipstick (  Completed)   POCT Wet + KOH Prep (Completed)   Urine Culture   Essential tremor    - stable, refilled meds    Gastroesophageal reflux disease without esophagitis     -  Continue current meds   Relevant Medications   pantoprazole (PROTONIX) 40 MG tablet   ondansetron (ZOFRAN) 8 MG tablet   Encounter for medication monitoring          Meds ordered this encounter  Medications  . clonazePAM (KLONOPIN) 0.5 MG tablet    Sig: Take 1 tablet (0.5 mg total) by mouth at bedtime.    Dispense:  30 tablet    Refill:  5    This request is for a new prescription for a controlled substance as required by Federal/State law.  . nebivolol (BYSTOLIC) 5 MG tablet    Sig: Take 1 tablet (5 mg total) by mouth daily.    Dispense:  90 tablet    Refill:  1  . primidone (MYSOLINE) 50 MG tablet    Sig: Take one tablet in the morning for essential tremors    Dispense:  90 tablet    Refill:  1  . pantoprazole (PROTONIX) 40 MG tablet    Sig: Take 1 tablet (40 mg total) by mouth at bedtime.    Dispense:  90 tablet    Refill:  3  . ondansetron (ZOFRAN) 8 MG tablet    Sig: Take 1 tablet (8 mg total) by mouth every 8 (eight) hours as needed for nausea or vomiting.    Dispense:  20 tablet    Refill:  0  . EPINEPHrine 0.3 mg/0.3 mL IJ SOAJ injection    Sig: Inject 0.3 mLs (0.3 mg total) into the muscle once as needed (For anaphylaxis.).    Dispense:  1 Device    Refill:  1    Follow-up: Return in about 6 months (around 01/02/2019) for med check for anxiety .    Forrest Moron, MD

## 2018-07-05 LAB — TSH: TSH: 1.7 u[IU]/mL (ref 0.450–4.500)

## 2018-07-05 LAB — CMP14+EGFR
A/G RATIO: 1.8 (ref 1.2–2.2)
ALK PHOS: 74 IU/L (ref 39–117)
ALT: 8 IU/L (ref 0–32)
AST: 10 IU/L (ref 0–40)
Albumin: 4.5 g/dL (ref 3.5–5.5)
BUN/Creatinine Ratio: 18 (ref 9–23)
BUN: 14 mg/dL (ref 6–24)
Bilirubin Total: 0.4 mg/dL (ref 0.0–1.2)
CO2: 22 mmol/L (ref 20–29)
Calcium: 9.8 mg/dL (ref 8.7–10.2)
Chloride: 104 mmol/L (ref 96–106)
Creatinine, Ser: 0.79 mg/dL (ref 0.57–1.00)
GFR calc Af Amer: 99 mL/min/{1.73_m2} (ref >59)
GFR, EST NON AFRICAN AMERICAN: 86 mL/min/{1.73_m2} (ref >59)
GLOBULIN, TOTAL: 2.5 g/dL (ref 1.5–4.5)
Glucose: 74 mg/dL (ref 65–99)
POTASSIUM: 4.4 mmol/L (ref 3.5–5.2)
SODIUM: 144 mmol/L (ref 134–144)
Total Protein: 7 g/dL (ref 6.0–8.5)

## 2018-07-05 LAB — LIPID PANEL
CHOLESTEROL TOTAL: 154 mg/dL (ref 100–199)
Chol/HDL Ratio: 2.7 ratio (ref 0.0–4.4)
HDL: 58 mg/dL (ref >39)
LDL CALC: 80 mg/dL (ref 0–99)
TRIGLYCERIDES: 81 mg/dL (ref 0–149)
VLDL Cholesterol Cal: 16 mg/dL (ref 5–40)

## 2018-07-06 LAB — URINE CULTURE

## 2018-07-11 MED ORDER — NITROFURANTOIN MONOHYD MACRO 100 MG PO CAPS: 100.0000 mg | ORAL_CAPSULE | Freq: Two times a day (BID) | ORAL | 0 refills | Status: AC

## 2018-07-11 NOTE — Addendum Note (Signed)
Addended by: Delia Chimes A on: 07/11/2018 09:39 PM   Modules accepted: Orders

## 2018-07-16 ENCOUNTER — Telehealth: Payer: Self-pay | Admitting: Family Medicine

## 2018-07-16 NOTE — Telephone Encounter (Signed)
Copied from [place] [id number]. Topic: Quick Communication - See Telephone Encounter >> Jul 16, 2018  4:27 PM Antonieta Iba C wrote: CRM for notification. See Telephone encounter for: 07/16/18.  Pt called in to request a work note. Pt says that she was Dx with E.coli. pt says that she is still having the same symptoms and is unable to go to work. Pt would like a call back   CB: [phone]

## 2018-07-19 ENCOUNTER — Other Ambulatory Visit: Payer: Self-pay | Admitting: Family Medicine

## 2018-07-19 ENCOUNTER — Encounter: Payer: Self-pay | Admitting: Family Medicine

## 2018-07-19 ENCOUNTER — Ambulatory Visit: Payer: PRIVATE HEALTH INSURANCE | Admitting: Family Medicine

## 2018-07-19 VITALS — BP 143/82 | HR 70 | Temp 99.0°F | Resp 16 | Ht 67.0 in | Wt 139.0 lb

## 2018-07-19 DIAGNOSIS — R05 Cough: Secondary | ICD-10-CM

## 2018-07-19 DIAGNOSIS — R509 Fever, unspecified: Secondary | ICD-10-CM

## 2018-07-19 DIAGNOSIS — M791 Myalgia, unspecified site: Secondary | ICD-10-CM

## 2018-07-19 DIAGNOSIS — N390 Urinary tract infection, site not specified: Secondary | ICD-10-CM

## 2018-07-19 DIAGNOSIS — R319 Hematuria, unspecified: Secondary | ICD-10-CM

## 2018-07-19 DIAGNOSIS — R059 Cough, unspecified: Secondary | ICD-10-CM

## 2018-07-19 LAB — POCT URINALYSIS DIP (MANUAL ENTRY)
Bilirubin, UA: NEGATIVE
GLUCOSE UA: NEGATIVE mg/dL
Ketones, POC UA: NEGATIVE mg/dL
Leukocytes, UA: NEGATIVE
NITRITE UA: NEGATIVE
Spec Grav, UA: 1.025 (ref 1.010–1.025)
UROBILINOGEN UA: 0.2 U/dL
pH, UA: 5.5 (ref 5.0–8.0)

## 2018-07-19 LAB — CBC WITH DIFFERENTIAL/PLATELET
BASOS ABS: 0 10*3/uL (ref 0.0–0.2)
Basos: 1 %
EOS (ABSOLUTE): 0.2 10*3/uL (ref 0.0–0.4)
Eos: 5 %
Hematocrit: 36.5 % (ref 34.0–46.6)
Hemoglobin: 12.4 g/dL (ref 11.1–15.9)
LYMPHS ABS: 1.7 10*3/uL (ref 0.7–3.1)
Lymphs: 37 %
MCH: 30 pg (ref 26.6–33.0)
MCHC: 34 g/dL (ref 31.5–35.7)
MCV: 88 fL (ref 79–97)
MONOCYTES: 15 %
Monocytes Absolute: 0.7 10*3/uL (ref 0.1–0.9)
NEUTROS ABS: 1.9 10*3/uL (ref 1.4–7.0)
Neutrophils: 42 %
PLATELETS: 277 10*3/uL (ref 150–450)
RBC: 4.13 x10E6/uL (ref 3.77–5.28)
RDW: 13.1 % (ref 11.7–15.4)
WBC: 4.5 10*3/uL (ref 3.4–10.8)

## 2018-07-19 LAB — POCT INFLUENZA A/B
Influenza A, POC: NEGATIVE
Influenza B, POC: NEGATIVE

## 2018-07-19 LAB — POC MICROSCOPIC URINALYSIS (UMFC)

## 2018-07-19 MED ORDER — LEVOFLOXACIN 500 MG PO TABS: 500.0000 mg | ORAL_TABLET | Freq: Every day | ORAL | 0 refills | Status: DC

## 2018-07-19 MED ORDER — FLUCONAZOLE 150 MG PO TABS: 150.0000 mg | ORAL_TABLET | Freq: Once | ORAL | 0 refills | Status: AC

## 2018-07-19 NOTE — Progress Notes (Signed)
Subjective:    Patient ID: Sharon [name], female    DOB: March 19, 1966, 53 y.o.   MRN: [id number]  HPI Sharon [name] is a 53 y.o. female Presents today for: Chief Complaint  Patient presents with  . Fever    x 1 wk  . Chills    bodyaches/ x 1 wk  . Cough    x 1 wk   Flu like symptoms past week. Aches, sweats and chills, nausea. Bodyaches, headaches.  No dysuria, frequency, or abdominal pain. Cough started yesterday, runny nose yesterday, congestion for past 2 days.    Has had high fever - 103  When checked 6 days. Over 100 this week, then 99 last night.  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  Multiple sick contacts with influenza.   Tx: tylenol every 6-8 hours., zofran initially 2 days.   Did receive flu vaccine this season.   Feeling minimally better today. No rigors.   Seen 07/04/18 by PCP Nolon Rod, Arlie Solomons, MD). Treated with macrobid for possible UTI with urine odor. No new dysuria/urgency/frequency. Has been taking the macrobid - has 1 left tonight.  Urine culture: E coli, pansensitive.    Patient Active Problem List   Diagnosis Date Noted  . Chronic pain of left knee 03/27/2018  . Chronic right shoulder pain 03/27/2018  . Cervicalgia 03/27/2018  . Status post arthroscopy of left knee 08/06/2017  . Unilateral primary osteoarthritis, left knee 08/06/2017  . Tremor 07/10/2017  . Other meniscus derangements, posterior horn of medial meniscus, left knee 06/28/2017  . Ureteral stone 05/23/2017  . History of sepsis 05/23/2017  . Acute pain of left knee 04/09/2017  . Trochanteric bursitis, right hip 09/27/2016  . GAD (generalized anxiety disorder) 05/31/2014  . Rectocele 08/21/2011  . Uterovaginal prolapse 08/21/2011  . Dyspnea 10/13/2010  . Hypertension 10/13/2010   Past Medical History:  Diagnosis Date  . Allergy   . Anal fissure   . Anxiety   . Arthritis   . Blood clot in vein    left leg  . Detached retina    BOTH SCLERA  BUCKLE BOTH EYES  . DVT (deep venous thrombosis) (HCC)    left leg  . Endometriosis   . Family history of adverse reaction to anesthesia    DAUGHTER POST OP PONV  . GERD (gastroesophageal reflux disease)   . Glaucoma    RIGHT WORST THAN LEFT  . Heart murmur   . History of hemorrhoids   . Hypertension   . IBS (irritable bowel syndrome)   . Perforated eardrum, right    SMALL HOLE  . PONV (postoperative nausea and vomiting)   . Pulmonary embolism ([place]) 6 YRS AGO   Past Surgical History:  Procedure Laterality Date  . ABDOMINAL HYSTERECTOMY    . CATARACT EXTRACTION Bilateral 2015  . CYSTOSCOPY W/ URETERAL STENT PLACEMENT Right 05/23/2017   Procedure: CYSTOSCOPY WITH RETROGRADE PYELOGRAM/URETERAL RIGHT STENT PLACEMENT;  Surgeon: Bjorn Loser, MD;  Location: [facility];  Service: Urology;  Laterality: Right;  . CYSTOSCOPY W/ URETERAL STENT PLACEMENT Right 06/04/2017   Procedure: CYSTOSCOPY WITHRIGHT RETROGRADE PYELOGRAMRIGHT /URETEREOSCOPY  STENT PLACEMENT;  Surgeon: Lucas Mallow, MD;  [facility];  Service: Urology;  Laterality: Right;  . ENDOMETRIAL ABLATION    . [place]  2011  . INCONTINENCE SURGERY    . INNER EAR SURGERY     X 2  . KNEE SURGERY    . RECTAL PROLAPSE REPAIR    .  RETINAL DETACHMENT SURGERY Bilateral 2000  . RETINAL DETACHMENT SURGERY Bilateral   . TUBAL LIGATION    . VEIN SURGERY Left 04/2010   Allergies  Allergen Reactions  . Dextromethorphan Polistirex Er Other (See Comments)    Pt states caused "a lump" in her throat, making it difficult to swallow  . Hydrocodone Nausea And Vomiting  . Oxycodone-Acetaminophen Other (See Comments)    Made fingers tingle and go numb, started "feeling weird".   . Penicillins Other (See Comments)    Reaction unknown from childhood Has patient had a PCN reaction causing immediate rash, facial/tongue/throat swelling, SOB or lightheadedness with hypotension: unknown Has patient had a PCN  reaction causing severe rash involving mucus membranes or skin necrosis: unknown  Has patient had a PCN reaction that required hospitalization: no Has patient had a PCN reaction occurring within the last 10 years: no If all of the above answers are "NO", then may proceed with Cephalosporin use.   . Tape Itching and Other (See Comments)    Bandaids - leaves red marks   Prior to Admission medications   Medication Sig Start Date End Date Taking? Authorizing Provider  acetaminophen (TYLENOL) 500 MG tablet Take 500 mg by mouth 2 (two) times daily.   Yes [provider]  acyclovir (ZOVIRAX) 400 MG tablet Take 1 tablet (400 mg total) by mouth at bedtime. 01/01/18  Yes Forrest Moron, MD  Ascorbic Acid (VITAMIN C) 1000 MG tablet Take 1,000 mg by mouth daily.   Yes [provider]  celecoxib (CELEBREX) 200 MG capsule Take 1 capsule (200 mg total) by mouth daily. Patient taking differently: Take 200 mg by mouth daily as needed (For pain.).  06/30/17  Yes Wardell Honour, MD  Cholecalciferol (VITAMIN D) 2000 UNITS tablet Take 2,000 Units by mouth daily.   Yes [provider]  clonazePAM (KLONOPIN) 0.5 MG tablet Take 1 tablet (0.5 mg total) by mouth at bedtime. 07/04/18  Yes Forrest Moron, MD  conjugated estrogens (PREMARIN) vaginal cream Insert pea-sized amount into vagina every other night 03/06/18  Yes [provider]  diclofenac sodium (VOLTAREN) 1 % GEL Apply 2 g topically 4 (four) times daily. 08/21/16  Yes Mcarthur Rossetti, MD  EPINEPHrine 0.3 mg/0.3 mL IJ SOAJ injection Inject 0.3 mLs (0.3 mg total) into the muscle once as needed (For anaphylaxis.). 07/04/18  Yes Delia Chimes A, MD  ibuprofen (ADVIL,MOTRIN) 200 MG tablet Take by mouth. 04/25/18  Yes [provider]  Magnesium 500 MG CAPS Take 500 mg by mouth daily.    Yes [provider]  Magnesium Oxide, Antacid, 500 MG CAPS Take by mouth.   Yes [provider]  Multiple Vitamin  (MULTI-VITAMINS) TABS Take by mouth.   Yes [provider]  nebivolol (BYSTOLIC) 5 MG tablet Take 1 tablet (5 mg total) by mouth daily. 07/04/18  Yes Stallings, Zoe A, MD  Nitroglycerin (RECTIV) 0.4 % OINT Place 0.4 inches rectally 2 (two) times daily as needed (For anal fissures.). 01/01/18  Yes Stallings, Zoe A, MD  ondansetron (ZOFRAN) 8 MG tablet Take 1 tablet (8 mg total) by mouth every 8 (eight) hours as needed for nausea or vomiting. 07/04/18  Yes Forrest Moron, MD  oxyCODONE-acetaminophen (PERCOCET/ROXICET) 5-325 MG tablet Take 1-2 tablets by mouth every 4 (four) hours as needed (For pain.).  07/26/17  Yes [provider]  pantoprazole (PROTONIX) 40 MG tablet Take 1 tablet (40 mg total) by mouth at bedtime. 07/04/18  Yes Stallings,  Zoe A, MD  Polyethylene Glycol 3350 (MIRALAX PO) Take 17 g by mouth daily as needed (For constipation.).    Yes [provider]  PREMARIN vaginal cream  05/06/18  Yes [provider]  primidone (MYSOLINE) 50 MG tablet Take one tablet in the morning for essential tremors 07/04/18  Yes Delia Chimes A, MD  ranitidine (ZANTAC) 150 MG tablet Take 2 tablets in the morning as needed for reflux 01/01/18  Yes Stallings, Zoe A, MD  traMADol (ULTRAM) 50 MG tablet Take by mouth. 04/25/18  Yes [provider]  Travoprost, BAK Free, (TRAVATAN Z) 0.004 % SOLN ophthalmic solution Place 1 drop into both eyes at bedtime.    Yes [provider]  vitamin B-12 (CYANOCOBALAMIN) 1000 MCG tablet Take 1,000 mcg by mouth daily.   Yes [provider]   Social History   Socioeconomic History  . Marital status: Divorced    Spouse name: Not on file  . Number of children: 1  . Years of education: Not on file  . Highest education level: Not on file  Occupational History  . Occupation: Restaurant manager, fast food: [employer]  Social Needs  . Financial resource strain: Not on file  . Food insecurity:     Worry: Not on file    Inability: Not on file  . Transportation needs:    Medical: Not on file    Non-medical: Not on file  Tobacco Use  . Smoking status: Never Smoker  . Smokeless tobacco: Never Used  Substance and Sexual Activity  . Alcohol use: Yes    Frequency: Never    Comment: occ  . Drug use: No    Comment: former maryjuana use- quit in 1995  . Sexual activity: Yes  Lifestyle  . Physical activity:    Days per week: Not on file    Minutes per session: Not on file  . Stress: Not on file  Relationships  . Social connections:    Talks on phone: Not on file    Gets together: Not on file    Attends religious service: Not on file    Active member of club or organization: Not on file    Attends meetings of clubs or organizations: Not on file    Relationship status: Not on file  . Intimate partner violence:    Fear of current or ex partner: Not on file    Emotionally abused: Not on file    Physically abused: Not on file    Forced sexual activity: Not on file  Other Topics Concern  . Not on file  Social History Narrative   Marital status: divorced x 2; dating seriously x 3 years.  Boyfriend with HIV.       Children:  1 daughter (62); no grandchildren      Lives:  With boyfriend.        Employment: works at [employer]: none      Alcohol: rarely; socially      Exercise: none; has treadmill and elliptical and bikes    Review of Systems Per HPI.     Objective:   Physical Exam Vitals signs reviewed.  Constitutional:      General: She is not in acute distress.    Appearance: She is well-developed.  HENT:     Head: Normocephalic and atraumatic.     Right Ear: Hearing, tympanic membrane, ear canal and external ear normal.  Left Ear: Hearing, tympanic membrane, ear canal and external ear normal.     Nose: Nose normal.     Mouth/Throat:     Pharynx: No oropharyngeal exudate.  Eyes:     Conjunctiva/sclera: Conjunctivae normal.     Pupils: Pupils are equal,  round, and reactive to light.  Cardiovascular:     Rate and Rhythm: Normal rate and regular rhythm.     Heart sounds: Normal heart sounds. No murmur.  Pulmonary:     Effort: Pulmonary effort is normal. No respiratory distress.     Breath sounds: Normal breath sounds. No wheezing or rhonchi.  Musculoskeletal:       Arms:  Skin:    General: Skin is warm and dry.     Findings: No rash.  Neurological:     Mental Status: She is alert and oriented to person, place, and time.  Psychiatric:        Behavior: Behavior normal.    Vitals:   07/19/18 1139  BP: (!) 143/82  Pulse: 70  Resp: 16  Temp: 99 F (37.2 C)  TempSrc: Oral  SpO2: 96%  Weight: 139 lb (63 kg)  Height: 5\' 7"  (1.702 m)   Results for orders placed or performed in visit on 07/19/18  POCT Influenza A/B  Result Value Ref Range   Influenza A, POC Negative Negative   Influenza B, POC Negative Negative  POCT urinalysis dipstick  Result Value Ref Range   Color, UA yellow yellow   Clarity, UA clear clear   Glucose, UA negative negative mg/dL   Bilirubin, UA negative negative   Ketones, POC UA negative negative mg/dL   Spec Grav, UA 1.025 1.010 - 1.025   Blood, UA moderate (A) negative   pH, UA 5.5 5.0 - 8.0   Protein Ur, POC trace (A) negative mg/dL   Urobilinogen, UA 0.2 0.2 or 1.0 E.U./dL   Nitrite, UA Negative Negative   Leukocytes, UA Negative Negative  POCT Microscopic Urinalysis ([facility])  Result Value Ref Range   WBC,UR,HPF,POC Few (A) None WBC/hpf   RBC,UR,HPF,POC Moderate (A) None RBC/hpf   Bacteria Few (A) None, Too numerous to count   Mucus Present (A) Absent   Epithelial Cells, UR Per Microscopy Moderate (A) None, Too numerous to count cells/hpf       Assessment & Plan:   Sharon [name] is a 53 y.o. female Fever, unspecified - Plan: POCT Influenza A/B, CBC with Differential/Platelet, levofloxacin (LEVAQUIN) 500 MG tablet, CANCELED: POCT CBC  Cough - Plan: POCT Influenza A/B, CANCELED: POCT  CBC  Urinary tract infection with hematuria, site unspecified - Plan: POCT urinalysis dipstick, POCT Microscopic Urinalysis ([facility]), CBC with Differential/Platelet, Urine Culture, levofloxacin (LEVAQUIN) 500 MG tablet  Myalgia - Plan: Urine Culture  Moderate blood on UA with persistent WBCs.  Recent treatment for UTI.  Differential includes potential pyelonephritis versus viral syndrome with myalgias.  -Change Macrobid to Levaquin, check urine culture.  Potential side effects and risks of antibiotics discussed.  Diflucan prescribed for possibility of candidal vaginitis.  -Symptomatic care discussed with fluids, Tylenol if needed, and recheck in approximately 3 days.    Meds ordered this encounter  Medications  . fluconazole (DIFLUCAN) 150 MG tablet    Sig: Take 1 tablet (150 mg total) by mouth once for 1 dose.    Dispense:  1 tablet    Refill:  0  . levofloxacin (LEVAQUIN) 500 MG tablet    Sig: Take 1 tablet (500 mg total) by mouth daily.  Dispense:  7 tablet    Refill:  0   Patient Instructions   Start levaquin for possible continued urine infection.  Stop macrodantin.  Drink plenty of fluids - water is best.  Tylenol for fever as needed.  Recheck in 3 days.   Diflucan sent to pharmacy if needed for yeast infection.   Return to the clinic or go to the nearest emergency room if any of your symptoms worsen or new symptoms occur.   If you have lab work done today you will be contacted with your lab results within the next 2 weeks.  If you have not heard from [place] then please contact us. The fastest way to get your results is to register for My Chart.   IF you received an x-ray today, you will receive an invoice from [facility]. Please contact [facility] at [phone] with questions or concerns regarding your invoice.   IF you received labwork today, you will receive an invoice from [facility]. Please contact [facility] at [phone] with questions or concerns  regarding your invoice.   Our billing staff will not be able to assist you with questions regarding bills from these companies.  You will be contacted with the lab results as soon as they are available. The fastest way to get your results is to activate your My Chart account. Instructions are located on the last page of this paperwork. If you have not heard from [place] regarding the results in 2 weeks, please contact this office.      Signed,   Merri Ray, MD [facility].  07/22/18 4:05 PM

## 2018-07-19 NOTE — Telephone Encounter (Signed)
Copied from [place] [id number]. Topic: Quick Communication - Rx Refill/Question >> Jul 19, 2018  1:56 PM [place], [place] D wrote: Medication: fluconazole (DIFLUCAN) 150 MG tablet / Pharmacy stated there is a drug interaction between pt's Diflucan and levofloxacin (LEVAQUIN) 500 MG tablet and primidone (MYSOLINE) 50 MG tablet. Please advise pharmacy it is okay to fill rx.  Has the patient contacted their pharmacy? Yes.   (Agent: If no, request that the patient contact the pharmacy for the refill.) (Agent: If yes, when and what did the pharmacy advise?)  Preferred Pharmacy (with phone number or street name): [id number] - [place], [place] [phone] (Phone) [phone] (Fax)  Agent: Please be advised that RX refills may take up to 3 business days. We ask that you follow-up with your pharmacy.

## 2018-07-19 NOTE — Patient Instructions (Addendum)
Start levaquin for possible continued urine infection.  Stop macrodantin.  Drink plenty of fluids - water is best.  Tylenol for fever as needed.  Recheck in 3 days.   Diflucan sent to pharmacy if needed for yeast infection.   Return to the clinic or go to the nearest emergency room if any of your symptoms worsen or new symptoms occur.   If you have lab work done today you will be contacted with your lab results within the next 2 weeks.  If you have not heard from [place] then please contact us. The fastest way to get your results is to register for My Chart.   IF you received an x-ray today, you will receive an invoice from [facility]. Please contact [facility] at [phone] with questions or concerns regarding your invoice.   IF you received labwork today, you will receive an invoice from [facility]. Please contact [facility] at [phone] with questions or concerns regarding your invoice.   Our billing staff will not be able to assist you with questions regarding bills from these companies.  You will be contacted with the lab results as soon as they are available. The fastest way to get your results is to activate your My Chart account. Instructions are located on the last page of this paperwork. If you have not heard from [place] regarding the results in 2 weeks, please contact this office.

## 2018-07-19 NOTE — Telephone Encounter (Signed)
Spoke with pt and advised her that a note could not be wrote for two-week for UTI, she verbalized understanding and informed me that she has an appointment today and will ask for a note at appointment.

## 2018-07-21 LAB — URINE CULTURE: ORGANISM ID, BACTERIA: NO GROWTH

## 2018-07-22 ENCOUNTER — Ambulatory Visit: Payer: PRIVATE HEALTH INSURANCE | Admitting: Family Medicine

## 2018-07-22 ENCOUNTER — Other Ambulatory Visit: Payer: Self-pay

## 2018-07-22 ENCOUNTER — Ambulatory Visit (INDEPENDENT_AMBULATORY_CARE_PROVIDER_SITE_OTHER): Payer: PRIVATE HEALTH INSURANCE

## 2018-07-22 ENCOUNTER — Encounter: Payer: Self-pay | Admitting: Family Medicine

## 2018-07-22 VITALS — BP 115/76 | HR 68 | Temp 99.1°F | Resp 14 | Ht 67.0 in | Wt 138.0 lb

## 2018-07-22 DIAGNOSIS — K5900 Constipation, unspecified: Secondary | ICD-10-CM

## 2018-07-22 DIAGNOSIS — N390 Urinary tract infection, site not specified: Secondary | ICD-10-CM | POA: Diagnosis not present

## 2018-07-22 DIAGNOSIS — R319 Hematuria, unspecified: Secondary | ICD-10-CM | POA: Diagnosis not present

## 2018-07-22 LAB — POCT URINALYSIS DIP (MANUAL ENTRY)
BILIRUBIN UA: NEGATIVE
Glucose, UA: NEGATIVE mg/dL
Ketones, POC UA: NEGATIVE mg/dL
Leukocytes, UA: NEGATIVE
NITRITE UA: NEGATIVE
PH UA: 7 (ref 5.0–8.0)
Protein Ur, POC: NEGATIVE mg/dL
Spec Grav, UA: 1.01 (ref 1.010–1.025)
Urobilinogen, UA: 0.2 E.U./dL

## 2018-07-22 LAB — POC MICROSCOPIC URINALYSIS (UMFC): Mucus: ABSENT

## 2018-07-22 NOTE — Progress Notes (Signed)
2/3/20205:44 PM  Sharon [name] [birth year], 53 y.o. female [id number]  Chief Complaint  Patient presents with  . Urinary Tract Infection    follow up was seen 07/19/18 was put on a new abx, sat only had 3 dose feeling little better not having back pain any more. Still been running a fever    HPI:   Patient is a 53 y.o. female with past medical history significant for HTN, pelvic prolapse, GAD, ureteral stones who presents today for followup on urinary symptoms  Seen in 07/19/2018 - treated with levaquin given urine microscopy and symptoms No WBC Urine culture negative  Overall back pain is better Still feeling feverish, taking tylenol scheduled Today was ready to go to work, became very nauseous, dizzy, had loose stools, thinking it is related to new abx 2 months ago had extensive pelvic surgery Denies any pelvic pain nor vaginal discharge Has not resumed sexual intercourse Yet H/o IBS - constipation predominant - takes miralax about every 3rd day more than that gives her uncontrolled diarrhea Has tried linsezz in the past, did not like that Needs to put manual pressure in order to have BM H/o rectocele, anal fissues and poor rectal tone seen by GI in 2018 - plan for anorectal motility studies No followup    Fall Risk  07/22/2018 07/19/2018 01/01/2018 06/30/2017 11/08/2016  Falls in the past year? 0 0 No No No  Number falls in past yr: 0 0 - - -  Injury with Fall? 0 0 - - -  Follow up Falls evaluation completed - - - -     Depression screen [facility] 2/9 07/22/2018 07/19/2018 01/01/2018  Decreased Interest 0 0 0  Down, Depressed, Hopeless 0 0 0  PHQ - 2 Score 0 0 0    Allergies  Allergen Reactions  . Dextromethorphan Polistirex Er Other (See Comments)    Pt states caused "a lump" in her throat, making it difficult to swallow  . Hydrocodone Nausea And Vomiting  . Oxycodone-Acetaminophen Other (See Comments)    Made fingers tingle and go numb, started "feeling weird".   .  Penicillins Other (See Comments)    Reaction unknown from childhood Has patient had a PCN reaction causing immediate rash, facial/tongue/throat swelling, SOB or lightheadedness with hypotension: unknown Has patient had a PCN reaction causing severe rash involving mucus membranes or skin necrosis: unknown  Has patient had a PCN reaction that required hospitalization: no Has patient had a PCN reaction occurring within the last 10 years: no If all of the above answers are "NO", then may proceed with Cephalosporin use.   . Tape Itching and Other (See Comments)    Bandaids - leaves red marks    Prior to Admission medications   Medication Sig Start Date End Date Taking? Authorizing Provider  acetaminophen (TYLENOL) 500 MG tablet Take 500 mg by mouth 2 (two) times daily.   Yes [provider]  acyclovir (ZOVIRAX) 400 MG tablet Take 1 tablet (400 mg total) by mouth at bedtime. 01/01/18  Yes Forrest Moron, MD  Ascorbic Acid (VITAMIN C) 1000 MG tablet Take 1,000 mg by mouth daily.   Yes [provider]  celecoxib (CELEBREX) 200 MG capsule Take 1 capsule (200 mg total) by mouth daily. Patient taking differently: Take 200 mg by mouth daily as needed (For pain.).  06/30/17  Yes Wardell Honour, MD  Cholecalciferol (VITAMIN D) 2000 UNITS tablet Take 2,000 Units by mouth daily.   Yes [provider]  clonazePAM (KLONOPIN) 0.5 MG tablet Take 1 tablet (0.5 mg total) by mouth at bedtime. 07/04/18  Yes Forrest Moron, MD  conjugated estrogens (PREMARIN) vaginal cream Insert pea-sized amount into vagina every other night 03/06/18  Yes [provider]  diclofenac sodium (VOLTAREN) 1 % GEL Apply 2 g topically 4 (four) times daily. 08/21/16  Yes Mcarthur Rossetti, MD  EPINEPHrine 0.3 mg/0.3 mL IJ SOAJ injection Inject 0.3 mLs (0.3 mg total) into the muscle once as needed (For anaphylaxis.). 07/04/18  Yes Delia Chimes A, MD  ibuprofen (ADVIL,MOTRIN) 200 MG tablet Take by  mouth. 04/25/18  Yes [provider]  levofloxacin (LEVAQUIN) 500 MG tablet Take 1 tablet (500 mg total) by mouth daily. 07/19/18  Yes Wendie Agreste, MD  Magnesium 500 MG CAPS Take 500 mg by mouth daily.    Yes [provider]  Magnesium Oxide, Antacid, 500 MG CAPS Take by mouth.   Yes [provider]  Multiple Vitamin (MULTI-VITAMINS) TABS Take by mouth.   Yes [provider]  nebivolol (BYSTOLIC) 5 MG tablet Take 1 tablet (5 mg total) by mouth daily. 07/04/18  Yes Stallings, Zoe A, MD  Nitroglycerin (RECTIV) 0.4 % OINT Place 0.4 inches rectally 2 (two) times daily as needed (For anal fissures.). 01/01/18  Yes Stallings, Zoe A, MD  ondansetron (ZOFRAN) 8 MG tablet Take 1 tablet (8 mg total) by mouth every 8 (eight) hours as needed for nausea or vomiting. 07/04/18  Yes Forrest Moron, MD  oxyCODONE-acetaminophen (PERCOCET/ROXICET) 5-325 MG tablet Take 1-2 tablets by mouth every 4 (four) hours as needed (For pain.).  07/26/17  Yes [provider]  pantoprazole (PROTONIX) 40 MG tablet Take 1 tablet (40 mg total) by mouth at bedtime. 07/04/18  Yes Forrest Moron, MD  Polyethylene Glycol 3350 (MIRALAX PO) Take 17 g by mouth daily as needed (For constipation.).    Yes [provider]  PREMARIN vaginal cream  05/06/18  Yes [provider]  primidone (MYSOLINE) 50 MG tablet Take one tablet in the morning for essential tremors 07/04/18  Yes Delia Chimes A, MD  ranitidine (ZANTAC) 150 MG tablet Take 2 tablets in the morning as needed for reflux 01/01/18  Yes Stallings, Zoe A, MD  traMADol (ULTRAM) 50 MG tablet Take by mouth. 04/25/18  Yes [provider]  Travoprost, BAK Free, (TRAVATAN Z) 0.004 % SOLN ophthalmic solution Place 1 drop into both eyes at bedtime.    Yes [provider]  vitamin B-12 (CYANOCOBALAMIN) 1000 MCG tablet Take 1,000 mcg by mouth daily.   Yes [provider]    Past Medical History:  Diagnosis  Date  . Allergy   . Anal fissure   . Anxiety   . Arthritis   . Blood clot in vein    left leg  . Detached retina    BOTH SCLERA BUCKLE BOTH EYES  . DVT (deep venous thrombosis) (HCC)    left leg  . Endometriosis   . Family history of adverse reaction to anesthesia    DAUGHTER POST OP PONV  . GERD (gastroesophageal reflux disease)   . Glaucoma    RIGHT WORST THAN LEFT  . Heart murmur   . History of hemorrhoids   . Hypertension   . IBS (irritable bowel syndrome)   . Perforated eardrum, right    SMALL HOLE  . PONV (postoperative nausea and vomiting)   . Pulmonary embolism ([place]) 6 YRS AGO    Past Surgical History:  Procedure Laterality Date  . ABDOMINAL HYSTERECTOMY    . CATARACT EXTRACTION Bilateral 2015  . CYSTOSCOPY W/ URETERAL STENT PLACEMENT Right 05/23/2017   Procedure: CYSTOSCOPY WITH RETROGRADE PYELOGRAM/URETERAL RIGHT STENT PLACEMENT;  Surgeon: Bjorn Loser, MD;  Location: [facility];  Service: Urology;  Laterality: Right;  . CYSTOSCOPY W/ URETERAL STENT PLACEMENT Right 06/04/2017   Procedure: CYSTOSCOPY WITHRIGHT RETROGRADE PYELOGRAMRIGHT /URETEREOSCOPY  STENT PLACEMENT;  Surgeon: Lucas Mallow, MD;  Location[facility];  Service: Urology;  Laterality: Right;  . ENDOMETRIAL ABLATION    . [place]  2011  . INCONTINENCE SURGERY    . INNER EAR SURGERY     X 2  . KNEE SURGERY    . RECTAL PROLAPSE REPAIR    . RETINAL DETACHMENT SURGERY Bilateral 2000  . RETINAL DETACHMENT SURGERY Bilateral   . TUBAL LIGATION    . VEIN SURGERY Left 04/2010    Social History   Tobacco Use  . Smoking status: Never Smoker  . Smokeless tobacco: Never Used  Substance Use Topics  . Alcohol use: Yes    Frequency: Never    Comment: occ    Family History  Problem Relation Age of Onset  . Emphysema Mother        smoker  . Allergies Mother   . Asthma Mother   . Heart disease Mother        AMI as cause of death  . COPD Mother   . Asthma Father     . Heart disease Father        AMI as cause of death  . Diabetes Father   . Asthma Brother   . Heart disease Brother 54       heart failure  . Lung cancer Maternal Grandfather        was a smoker  . Cancer Maternal Grandfather        lung  . Heart disease Paternal Grandmother   . Heart disease Paternal Grandfather   . Colon cancer Neg Hx     ROS Per hpi  OBJECTIVE:  Blood pressure 115/76, pulse 68, temperature 99.1 F (37.3 C), temperature source Oral, resp. rate 14, height 5\' 7"  (1.702 m), weight 138 lb (62.6 kg), last menstrual period 10/11/2010, SpO2 100 %. Body mass index is 21.61 kg/m.   Physical Exam Vitals signs and nursing note reviewed.  Constitutional:      Appearance: She is well-developed.  HENT:     Head: Normocephalic and atraumatic.     Mouth/Throat:     Pharynx: No oropharyngeal exudate.  Eyes:     General: No scleral icterus.    Conjunctiva/sclera: Conjunctivae normal.     Pupils: Pupils are equal, round, and reactive to light.  Neck:     Musculoskeletal: Neck supple.  Cardiovascular:     Rate and Rhythm: Normal rate and regular rhythm.     Heart sounds: Normal heart sounds. No murmur. No friction rub. No gallop.   Pulmonary:     Effort: Pulmonary effort is normal.     Breath sounds: Normal breath sounds. No wheezing or rales.  Abdominal:     General: Bowel sounds are normal. There is distension.     Palpations: Abdomen is soft.     Tenderness: There is abdominal tenderness (RLQ). There is no right CVA tenderness, left CVA tenderness, guarding or rebound.  Skin:    General: Skin is warm and dry.  Neurological:     Mental Status: She is alert  and oriented to person, place, and time.     Results for orders placed or performed in visit on 07/22/18 (from the past 24 hour(s))  POCT urinalysis dipstick     Status: Abnormal   Collection Time: 07/22/18  5:23 PM  Result Value Ref Range   Color, UA yellow yellow   Clarity, UA clear clear    Glucose, UA negative negative mg/dL   Bilirubin, UA negative negative   Ketones, POC UA negative negative mg/dL   Spec Grav, UA 1.010 1.010 - 1.025   Blood, UA trace-lysed (A) negative   pH, UA 7.0 5.0 - 8.0   Protein Ur, POC negative negative mg/dL   Urobilinogen, UA 0.2 0.2 or 1.0 E.U./dL   Nitrite, UA Negative Negative   Leukocytes, UA Negative Negative  POCT Microscopic Urinalysis ([facility])     Status: None   Collection Time: 07/22/18  5:30 PM  Result Value Ref Range   WBC,UR,HPF,POC None None WBC/hpf   RBC,UR,HPF,POC None None RBC/hpf   Bacteria None None, Too numerous to count   Mucus Absent Absent   Epithelial Cells, UR Per Microscopy None None, Too numerous to count cells/hpf    Dg Abd 2 Views  Result Date: 07/22/2018 CLINICAL DATA:  53 year old female with hematuria, UTI. EXAM: ABDOMEN - 2 VIEW COMPARISON:  CT Abdomen and Pelvis 01/30/2018. FINDINGS: Upright and supine views of the abdomen and pelvis. Negative lung bases. No pneumoperitoneum. Non obstructed bowel gas pattern. Retained stool throughout the colon. Chronic tubal ligation type clips in the right lower quadrant and along the right pelvic side wall are stable since 2019. No acute osseous abnormality identified. Incidental small pelvic phleboliths. No urinary calculus is evident. IMPRESSION: Non obstructed bowel gas pattern, no free air. Electronically Signed   By: Genevie Ann M.D.   On: 07/22/2018 18:22     ASSESSMENT and PLAN  1. Constipation, unspecified constipation type Discussed increasing miralax per AVS. Referring back to GI in order to complete workup recommended in 2018 as issue still present - Ambulatory referral to Gastroenterology  2. Urinary tract infection with hematuria, site unspecified U/a normal today. Feeling better. AF, compared her thermometer to ours (hers is inaccurate) complete abx treatment - POCT urinalysis dipstick - POCT Microscopic Urinalysis ([facility]) - DG Abd 2 Views; Future    Return if  symptoms worsen or fail to improve.    Rutherford Guys, MD Primary [facility] at [facility] [place], [place] [address] Ph.  [phone] Fax [phone]

## 2018-07-25 NOTE — Telephone Encounter (Signed)
Pharmacy states that pt cannot take the diflucan or the laevquin with her mysoline medication because of contradiction

## 2018-07-30 ENCOUNTER — Ambulatory Visit (INDEPENDENT_AMBULATORY_CARE_PROVIDER_SITE_OTHER): Payer: PRIVATE HEALTH INSURANCE | Admitting: Orthopaedic Surgery

## 2018-07-30 ENCOUNTER — Encounter (INDEPENDENT_AMBULATORY_CARE_PROVIDER_SITE_OTHER): Payer: Self-pay | Admitting: Orthopaedic Surgery

## 2018-07-30 DIAGNOSIS — M25511 Pain in right shoulder: Secondary | ICD-10-CM

## 2018-07-30 DIAGNOSIS — G8929 Other chronic pain: Secondary | ICD-10-CM

## 2018-07-30 DIAGNOSIS — M1712 Unilateral primary osteoarthritis, left knee: Secondary | ICD-10-CM | POA: Diagnosis not present

## 2018-07-30 DIAGNOSIS — M5441 Lumbago with sciatica, right side: Secondary | ICD-10-CM

## 2018-07-30 MED ORDER — LIDOCAINE HCL 1 % IJ SOLN
3.0000 mL | INTRAMUSCULAR | Status: AC | PRN
  Administered 2018-07-30: 3 mL

## 2018-07-30 MED ORDER — METHYLPREDNISOLONE ACETATE 40 MG/ML IJ SUSP
40.0000 mg | INTRAMUSCULAR | Status: AC | PRN
  Administered 2018-07-30: 40 mg via INTRA_ARTICULAR

## 2018-07-30 NOTE — Progress Notes (Signed)
Office Visit Note   Patient: Sharon [name]           Date of Birth: [birth year]           MRN: [id number] Visit Date: 07/30/2018              Requested by: Forrest Moron, MD [place], [place] [address] PCP: Forrest Moron, MD   Assessment & Plan: Visit Diagnoses:  1. Right-sided low back pain with right-sided sciatica, unspecified chronicity   2. Chronic right shoulder pain   3. Unilateral primary osteoarthritis, left knee     Plan: She has not had a steroid injection in her right shoulder in a very long period time so I did recommend this and she agreed to it and tolerated it well.  She understands the risk and that is steroid injections.  I do feel she will benefit from an exercise routine to strengthen her knee.  We talked about her lumbar spine as well.  She is not interested in any type of referral for surgery yet.  The only other option for her knee would be a knee replacement.  All question concerns were answered and addressed.  We can see her back in 3 months as she is doing overall.  Follow-Up Instructions: Return in about 3 months (around 10/28/2018).   Orders:  Orders Placed This Encounter  Procedures  . Large Joint Inj   No orders of the defined types were placed in this encounter.     Procedures: Large Joint Inj: R subacromial bursa on 07/30/2018 9:49 AM Indications: pain and diagnostic evaluation Details: 22 G 1.5 in needle  Arthrogram: No  Medications: 3 mL lidocaine 1 %; 40 mg methylPREDNISolone acetate 40 MG/ML Outcome: tolerated well, no immediate complications Procedure, treatment alternatives, risks and benefits explained, specific risks discussed. Consent was given by the patient. Immediately prior to procedure a time out was called to verify the correct patient, procedure, equipment, support staff and site/side marked as required. Patient was prepped and draped in the usual sterile fashion.       Clinical Data: No additional  findings.   Subjective: Chief Complaint  Patient presents with  . Left Knee - Follow-up  . Right Shoulder - Follow-up  The patient is following up with chronic pain issues in several areas.  She has known osteoarthritis of her left knee and she has had a round of Euflexxa injections ending in December of the left knee.  She also has right shoulder impingement syndrome with severe tendinosis of the rotator cuff.  She has right hip trochanteric bursitis.  She also has a symptomatic right-sided disc at L4-L5 and lumbar spine.  She is had epidural steroid injection her back.  She did help for about a week or 2.  Is a very thin individual.  She is working on an exercise routine to get her knee stronger.  All these areas still hurt to her.  Her left knee buckles on occasion.  Her right shoulder hurts with overhead activities and reaching behind her.  She is not a diabetic.  HPI  Review of Systems She currently denies any headache, chest pain, shortness of breath, fever, chills, nausea, vomiting  Objective: Vital Signs: LMP 10/11/2010   Physical Exam She is alert and orient x3 and in no acute distress Ortho Exam Examination of her right shoulder does show signs of impingement syndrome.  She hurts with overhead activity and reaching behind her.  Examination of her  left knee shows no effusion in the knee is ligaments are stable good range of motion but significant medial joint line tenderness corresponding with her arthritis. Specialty Comments:  No specialty comments available.  Imaging: No results found.   PMFS History: Patient Active Problem List   Diagnosis Date Noted  . Chronic pain of left knee 03/27/2018  . Chronic right shoulder pain 03/27/2018  . Cervicalgia 03/27/2018  . Status post arthroscopy of left knee 08/06/2017  . Unilateral primary osteoarthritis, left knee 08/06/2017  . Tremor 07/10/2017  . Other meniscus derangements, posterior horn of medial meniscus, left knee  06/28/2017  . Ureteral stone 05/23/2017  . History of sepsis 05/23/2017  . Acute pain of left knee 04/09/2017  . Trochanteric bursitis, right hip 09/27/2016  . GAD (generalized anxiety disorder) 05/31/2014  . Rectocele 08/21/2011  . Uterovaginal prolapse 08/21/2011  . Dyspnea 10/13/2010  . Hypertension 10/13/2010   Past Medical History:  Diagnosis Date  . Allergy   . Anal fissure   . Anxiety   . Arthritis   . Blood clot in vein    left leg  . Detached retina    BOTH SCLERA BUCKLE BOTH EYES  . DVT (deep venous thrombosis) (HCC)    left leg  . Endometriosis   . Family history of adverse reaction to anesthesia    DAUGHTER POST OP PONV  . GERD (gastroesophageal reflux disease)   . Glaucoma    RIGHT WORST THAN LEFT  . Heart murmur   . History of hemorrhoids   . Hypertension   . IBS (irritable bowel syndrome)   . Perforated eardrum, right    SMALL HOLE  . PONV (postoperative nausea and vomiting)   . Pulmonary embolism ([place]) 6 YRS AGO    Family History  Problem Relation Age of Onset  . Emphysema Mother        smoker  . Allergies Mother   . Asthma Mother   . Heart disease Mother        AMI as cause of death  . COPD Mother   . Asthma Father   . Heart disease Father        AMI as cause of death  . Diabetes Father   . Asthma Brother   . Heart disease Brother 48       heart failure  . Lung cancer Maternal Grandfather        was a smoker  . Cancer Maternal Grandfather        lung  . Heart disease Paternal Grandmother   . Heart disease Paternal Grandfather   . Colon cancer Neg Hx     Past Surgical History:  Procedure Laterality Date  . ABDOMINAL HYSTERECTOMY    . CATARACT EXTRACTION Bilateral 2015  . CYSTOSCOPY W/ URETERAL STENT PLACEMENT Right 05/23/2017   Procedure: CYSTOSCOPY WITH RETROGRADE PYELOGRAM/URETERAL RIGHT STENT PLACEMENT;  Surgeon: Bjorn Loser, MD;  Location: [facility];  Service: Urology;  Laterality: Right;  . CYSTOSCOPY W/ URETERAL STENT  PLACEMENT Right 06/04/2017   Procedure: CYSTOSCOPY WITHRIGHT RETROGRADE PYELOGRAMRIGHT /URETEREOSCOPY  STENT PLACEMENT;  Surgeon: Lucas Mallow, MD;  Location[facility];  Service: Urology;  Laterality: Right;  . ENDOMETRIAL ABLATION    . [place]  2011  . INCONTINENCE SURGERY    . INNER EAR SURGERY     X 2  . KNEE SURGERY    . RECTAL PROLAPSE REPAIR    . RETINAL DETACHMENT SURGERY Bilateral 2000  . RETINAL DETACHMENT  SURGERY Bilateral   . TUBAL LIGATION    . VEIN SURGERY Left 04/2010   Social History   Occupational History  . Occupation: Restaurant manager, fast food: [employer]  Tobacco Use  . Smoking status: Never Smoker  . Smokeless tobacco: Never Used  Substance and Sexual Activity  . Alcohol use: Yes    Frequency: Never    Comment: occ  . Drug use: No    Comment: former maryjuana use- quit in 1995  . Sexual activity: Yes

## 2018-08-02 ENCOUNTER — Other Ambulatory Visit: Payer: Self-pay | Admitting: Family Medicine

## 2018-08-02 NOTE — Telephone Encounter (Signed)
Patient is requesting a refill of the following medications: Requested Prescriptions   Pending Prescriptions Disp Refills  . primidone (MYSOLINE) 50 MG tablet [Pharmacy Med Name: PRIMIDONE 50 MG TABLET] 60 tablet 0    Sig: TAKE 2 TABLETS (100 MG TOTAL) BY MOUTH AT BEDTIME.    Date of patient request: 08/02/18 Last office visit: 07/04/2018 Date of last refill: 07/01/18 Last refill amount: #90 with 1 refill Follow up time period per chart: n/a  Please advise. Dgaddy, CMA

## 2018-08-02 NOTE — Telephone Encounter (Signed)
Already done on 07/04/2018

## 2018-08-02 NOTE — Telephone Encounter (Signed)
Requested medication (s) are due for refill today: yes  Requested medication (s) are on the active medication list: yes  Last refill:  07/01/2018  Future visit scheduled: no  Notes to clinic:  Not delegated    Requested Prescriptions  Pending Prescriptions Disp Refills   primidone (MYSOLINE) 50 MG tablet [Pharmacy Med Name: PRIMIDONE 50 MG TABLET] 60 tablet 0    Sig: TAKE 2 TABLETS (100 MG TOTAL) BY MOUTH AT BEDTIME.     Not Delegated - Neurology:  Anticonvulsants Failed - 08/02/2018  2:08 AM      Failed - This refill cannot be delegated      Passed - HCT in normal range and within 360 days    Hematocrit  Date Value Ref Range Status  07/19/2018 36.5 34.0 - 46.6 % Final         Passed - HGB in normal range and within 360 days    Hemoglobin  Date Value Ref Range Status  07/19/2018 12.4 11.1 - 15.9 g/dL Final         Passed - PLT in normal range and within 360 days    Platelets  Date Value Ref Range Status  07/19/2018 277 150 - 450 x10E3/uL Final   Platelet Count, POC  Date Value Ref Range Status  10/21/2015 288 142 - 424 K/uL Final         Passed - WBC in normal range and within 360 days    WBC  Date Value Ref Range Status  07/19/2018 4.5 3.4 - 10.8 x10E3/uL Final  08/17/2017 7.5 4.0 - 10.5 K/uL Final         Passed - Valid encounter within last 12 months    Recent Outpatient Visits          1 week ago Constipation, unspecified constipation type   [facility], Lilia Argue, MD   2 weeks ago Fever, unspecified   [facility], Ranell Patrick, MD   4 weeks ago Essential hypertension   [facility] at [facility], [place], MD   7 months ago Gastroesophageal reflux disease without esophagitis   [facility], Arlie Solomons, MD   1 year ago Essential hypertension   Primary [facility] at [facility], Renette Butters, MD      Future Appointments            In 2 months Mcarthur Rossetti, MD [facility]

## 2018-08-09 ENCOUNTER — Telehealth: Payer: Self-pay | Admitting: Family Medicine

## 2018-08-09 NOTE — Telephone Encounter (Signed)
Copied from [place] [phone]. Topic: Quick Communication - Rx Refill/Question >> Aug 09, 2018  9:15 AM Alfredia Ferguson R wrote: Medication: fluconazole (DIFLUCAN) 150 MG tablet ; primidone (MYSOLINE) 50 MG tablet  Pharmacy is requesting clarification of drug interaction  Preferred Pharmacy (with phone number or street name): [id number] - [place], [place] [phone] (Phone) [phone] (Fax)

## 2018-08-31 ENCOUNTER — Telehealth: Payer: Self-pay | Admitting: Family Medicine

## 2018-09-02 NOTE — Telephone Encounter (Signed)
Requested medication (s) are due for refill today: no  Requested medication (s) are on the active medication list: yes    Last refill: 07/04/2018  Future visit scheduled no  Notes to clinic:not delegated  Requested Prescriptions  Pending Prescriptions Disp Refills   primidone (MYSOLINE) 50 MG tablet [Pharmacy Med Name: PRIMIDONE 50 MG TABLET] 60 tablet 0    Sig: TAKE 2 TABLETS (100 MG TOTAL) BY MOUTH AT BEDTIME.     Not Delegated - Neurology:  Anticonvulsants Failed - 08/31/2018  6:28 PM      Failed - This refill cannot be delegated      Passed - HCT in normal range and within 360 days    Hematocrit  Date Value Ref Range Status  07/19/2018 36.5 34.0 - 46.6 % Final         Passed - HGB in normal range and within 360 days    Hemoglobin  Date Value Ref Range Status  07/19/2018 12.4 11.1 - 15.9 g/dL Final         Passed - PLT in normal range and within 360 days    Platelets  Date Value Ref Range Status  07/19/2018 277 150 - 450 x10E3/uL Final   Platelet Count, POC  Date Value Ref Range Status  10/21/2015 288 142 - 424 K/uL Final         Passed - WBC in normal range and within 360 days    WBC  Date Value Ref Range Status  07/19/2018 4.5 3.4 - 10.8 x10E3/uL Final  08/17/2017 7.5 4.0 - 10.5 K/uL Final         Passed - Valid encounter within last 12 months    Recent Outpatient Visits          1 month ago Constipation, unspecified constipation type   [facility], Lilia Argue, MD   1 month ago Fever, unspecified   [facility], Ranell Patrick, MD   2 months ago Essential hypertension   [facility] at [facility], [place], MD   8 months ago Gastroesophageal reflux disease without esophagitis   Primary Care at [facility], Arlie Solomons, MD   1 year ago Essential hypertension   [facility] at [facility], Renette Butters, MD      Future Appointments            In 1 month Ninfa Linden Lind Guest, MD [facility]

## 2018-09-05 ENCOUNTER — Telehealth: Payer: Self-pay | Admitting: *Deleted

## 2018-09-05 ENCOUNTER — Other Ambulatory Visit: Payer: Self-pay | Admitting: *Deleted

## 2018-09-05 MED ORDER — PRIMIDONE 50 MG PO TABS: ORAL_TABLET | ORAL | 0 refills | Status: DC

## 2018-09-05 NOTE — Telephone Encounter (Signed)
Pt called in to check the status her refill request on medication Primidone.   Please assist.

## 2018-09-05 NOTE — Telephone Encounter (Signed)
New prescription called in unable to reach pharmacy . Patient got a text saying no refills according to computer there should be a refill.   Patient was advised

## 2018-09-06 NOTE — Telephone Encounter (Signed)
Was filled yesterday 

## 2018-09-10 ENCOUNTER — Encounter: Payer: Self-pay | Admitting: Family Medicine

## 2018-09-17 ENCOUNTER — Other Ambulatory Visit: Payer: Self-pay | Admitting: Family Medicine

## 2018-09-17 NOTE — Telephone Encounter (Signed)
Copied from [place] [id number]. Topic: Quick Communication - Rx Refill/Question >> Sep 17, 2018 10:58 AM Gustavus Messing wrote: Medication: ranitidine (ZANTAC) 150 MG tablet [[id number]]   Has the patient contacted their pharmacy? Yes.   (Agent: If yes, when and what did the pharmacy advise?) The pharmacy could not find the prescription at all. They stated that the patient needs a new prescription for this medication.  Preferred Pharmacy (with phone number or street name): [id number] - [place], [place] [phone] (Phone) [phone] (Fax)    Agent: Please be advised that RX refills may take up to 3 business days. We ask that you follow-up with your pharmacy.

## 2018-09-27 NOTE — Telephone Encounter (Signed)
Spoke with pt via phone to ask if issue with diflucan/levaquin were addressed and pt advises no nothing was ever done and it was ok but she had a more pressing concern she needed addressed.  Pt advises ranitidine was taken off shelf and she needs an alternative med as she takes this medicine during the day.  I advised would send message to provider.  Pt agreeable.

## 2018-10-08 ENCOUNTER — Telehealth: Payer: Self-pay | Admitting: Family Medicine

## 2018-10-08 NOTE — Telephone Encounter (Signed)
Copied from [place] [phone]. Topic: Quick Communication - Rx Refill/Question >> Oct 08, 2018  5:59 PM Loma Boston wrote: CRM for notification. See Telephone encounter for: 10/08/18.ranitidine (ZANTAC) 150 MG tablet PT says that the pharmacy will not fill this type drug anymore and that she needs a replacement. Says she spoke to someone a coup[le weeks ago and it still has not been done and she does not have anything to take. Requesting follow up

## 2018-10-10 MED ORDER — OMEPRAZOLE 20 MG PO CPDR: 20.0000 mg | DELAYED_RELEASE_CAPSULE | Freq: Every day | ORAL | 3 refills | Status: DC

## 2018-10-10 NOTE — Telephone Encounter (Signed)
Please change different medication.

## 2018-10-10 NOTE — Telephone Encounter (Signed)
Attempted to call pt to let her know that a new medication was sent in. There was no answer so I left a message for the pt.   She may call back if she has any additional questions or concerns

## 2018-10-10 NOTE — Telephone Encounter (Signed)
Sent in prilosec

## 2018-10-28 ENCOUNTER — Ambulatory Visit (INDEPENDENT_AMBULATORY_CARE_PROVIDER_SITE_OTHER): Payer: PRIVATE HEALTH INSURANCE

## 2018-10-28 ENCOUNTER — Other Ambulatory Visit: Payer: Self-pay

## 2018-10-28 ENCOUNTER — Ambulatory Visit (INDEPENDENT_AMBULATORY_CARE_PROVIDER_SITE_OTHER): Payer: PRIVATE HEALTH INSURANCE | Admitting: Orthopaedic Surgery

## 2018-10-28 ENCOUNTER — Other Ambulatory Visit: Payer: Self-pay | Admitting: Orthopaedic Surgery

## 2018-10-28 ENCOUNTER — Encounter: Payer: Self-pay | Admitting: Orthopaedic Surgery

## 2018-10-28 DIAGNOSIS — M25572 Pain in left ankle and joints of left foot: Secondary | ICD-10-CM | POA: Diagnosis not present

## 2018-10-28 DIAGNOSIS — G8929 Other chronic pain: Secondary | ICD-10-CM | POA: Diagnosis not present

## 2018-10-28 DIAGNOSIS — M25562 Pain in left knee: Secondary | ICD-10-CM | POA: Diagnosis not present

## 2018-10-28 DIAGNOSIS — M25511 Pain in right shoulder: Secondary | ICD-10-CM | POA: Diagnosis not present

## 2018-10-28 DIAGNOSIS — M7541 Impingement syndrome of right shoulder: Secondary | ICD-10-CM

## 2018-10-28 MED ORDER — DICLOFENAC SODIUM 1 % TD GEL: 2.0000 g | Freq: Four times a day (QID) | TRANSDERMAL | 3 refills | Status: DC

## 2018-10-28 MED ORDER — METHYLPREDNISOLONE ACETATE 40 MG/ML IJ SUSP
40.0000 mg | INTRAMUSCULAR | Status: AC | PRN
  Administered 2018-10-28: 40 mg via INTRA_ARTICULAR

## 2018-10-28 MED ORDER — METHYLPREDNISOLONE ACETATE 40 MG/ML IJ SUSP
40.0000 mg | INTRAMUSCULAR | Status: AC | PRN
  Administered 2018-10-28: 09:00:00 40 mg via INTRA_ARTICULAR

## 2018-10-28 MED ORDER — LIDOCAINE HCL 1 % IJ SOLN
3.0000 mL | INTRAMUSCULAR | Status: AC | PRN
  Administered 2018-10-28: 09:00:00 3 mL

## 2018-10-28 NOTE — Progress Notes (Signed)
Office Visit Note   Patient: Sharon [name]           Date of Birth: 21-Aug-1965           MRN: [id number] Visit Date: 10/28/2018              Requested by: Forrest Moron, MD [place], [place] [address] PCP: Forrest Moron, MD   Assessment & Plan: Visit Diagnoses:  1. Chronic right shoulder pain   2. Impingement syndrome of right shoulder   3. Chronic pain of left knee   4. Pain in left ankle and joints of left foot     Plan: Per her wishes I did provide a steroid injection in the subacromial space of her right shoulder and in the left knee joint.  She understands the risk and benefits of injections as well.  She will continue to work on quad strengthening exercises and avoid heavy lifting.  I will give her a note that will allow her work to keep her on light duty with no lifting greater than 10 pounds.  She should avoid pulling activities as well as squatting.  As far as her left foot goes, I do feel that her bunion deformity is creating issues for her.  Also feel that she is walking more on the lateral aspect of her left foot to compensate for her medial compartment arthritis on her left knee.  I have recommended more firm soled shoes as well as custom inserts.  Follow-Up Instructions: Return in about 3 months (around 01/28/2019).   Orders:  Orders Placed This Encounter  Procedures   Large Joint Inj: R subacromial bursa   Large Joint Inj: R knee   XR Foot Complete Left   No orders of the defined types were placed in this encounter.     Procedures: Large Joint Inj: R subacromial bursa on 10/28/2018 8:44 AM Indications: pain and diagnostic evaluation Details: 22 G 1.5 in needle  Arthrogram: No  Medications: 3 mL lidocaine 1 %; 40 mg methylPREDNISolone acetate 40 MG/ML Outcome: tolerated well, no immediate complications Procedure, treatment alternatives, risks and benefits explained, specific risks discussed. Consent was given by the patient. Immediately  prior to procedure a time out was called to verify the correct patient, procedure, equipment, support staff and site/side marked as required. Patient was prepped and draped in the usual sterile fashion.   Large Joint Inj: L knee on 10/28/2018 8:55 AM Indications: diagnostic evaluation and pain Details: 22 G 1.5 in needle, superolateral approach  Arthrogram: No  Medications: 3 mL lidocaine 1 %; 40 mg methylPREDNISolone acetate 40 MG/ML Outcome: tolerated well, no immediate complications Procedure, treatment alternatives, risks and benefits explained, specific risks discussed. Consent was given by the patient. Immediately prior to procedure a time out was called to verify the correct patient, procedure, equipment, support staff and site/side marked as required. Patient was prepped and draped in the usual sterile fashion.       Clinical Data: No additional findings.   Subjective: Chief Complaint  Patient presents with   Right Shoulder - Follow-up  The patient comes in today for continued follow-up of multiple complaints.  Is been 3 months since we have seen her.  She has severe right shoulder impingement syndrome.  We injected her shoulder 3 months ago and she said that this did help with her low back pain and right hip pain.  She has chronic right hip trochanteric bursitis and she does have some degenerative changes  in the lumbar spine and a known L3 herniated disc.  She is also dealing with left knee pain with known tricompartment arthritis of the left knee.  She now is complaining of left foot pain as she is checking out after we saw her for her shoulder and knee stating that her left foot is been bothering her so put her back into an exam room to assess out her left foot but she did not mention during her original visit today until checking out.  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  HPI  Review of Systems She currently denies any headache, chest pain, shortness of breath, fever, chills, nausea, vomiting  Objective: Vital Signs: LMP 10/11/2010   Physical Exam She currently denies any headache, chest pain, shortness of breath, fever, chills, nausea, vomiting Ortho Exam She is alert and orient x3 and in no acute distress.  Examination of the right shoulder shows that it moves smoothly but does still show signs of impingement syndrome.  There is no deficits in the rotator cuff on exam.  Examination of the left knee shows significant medial joint line tenderness and a mild effusion with good range of motion.  Left knee is ligamentously stable.  There is no significant pain over the trochanteric area or the low back today.  Examination of her left foot shows a bunion deformity of the first ray but otherwise normal contours of the foot and a normal arch.  Her foot is well-perfused with normal sensation and no wounds and no swelling.  She hurts along the thready. Specialty Comments:  No specialty comments available.  Imaging: Xr Foot Complete Left  Result Date: 10/28/2018 3 views of the left foot show no acute findings.  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    PMFS History: Patient Active Problem List   Diagnosis Date Noted   Impingement syndrome of right shoulder 10/28/2018   Chronic pain of left knee 03/27/2018   Chronic right shoulder pain 03/27/2018   Cervicalgia 03/27/2018   Status post arthroscopy of left knee 08/06/2017   Unilateral primary osteoarthritis, left knee 08/06/2017   Tremor 07/10/2017   Other meniscus derangements, posterior horn of medial meniscus, left knee 06/28/2017   Ureteral stone 05/23/2017   History of sepsis 05/23/2017   Acute pain of left knee 04/09/2017   Trochanteric bursitis, right hip 09/27/2016   GAD (generalized anxiety  disorder) 05/31/2014   Rectocele 08/21/2011   Uterovaginal prolapse 08/21/2011   Dyspnea 10/13/2010   Hypertension 10/13/2010   Past Medical History:  Diagnosis Date   Allergy    Anal fissure    Anxiety    Arthritis    Blood clot in vein    left leg   Detached retina    BOTH SCLERA BUCKLE BOTH EYES   DVT (deep venous thrombosis) (HCC)    left leg   Endometriosis    Family history of adverse reaction to anesthesia    DAUGHTER POST OP PONV   GERD (gastroesophageal reflux disease)    Glaucoma    RIGHT WORST THAN LEFT   Heart murmur    History of hemorrhoids    Hypertension    IBS (irritable bowel syndrome)    Perforated eardrum, right    SMALL HOLE  PONV (postoperative nausea and vomiting)    Pulmonary embolism ([place]) 6 YRS AGO    Family History  Problem Relation Age of Onset   Emphysema Mother        smoker   Allergies Mother    Asthma Mother    Heart disease Mother        AMI as cause of death   COPD Mother    Asthma Father    Heart disease Father        AMI as cause of death   Diabetes Father    Asthma Brother    Heart disease Brother 21       heart failure   Lung cancer Maternal Grandfather        was a smoker   Cancer Maternal Grandfather        lung   Heart disease Paternal Grandmother    Heart disease Paternal Grandfather    Colon cancer Neg Hx     Past Surgical History:  Procedure Laterality Date   ABDOMINAL HYSTERECTOMY     CATARACT EXTRACTION Bilateral 2015   CYSTOSCOPY W/ URETERAL STENT PLACEMENT Right 05/23/2017   Procedure: CYSTOSCOPY WITH RETROGRADE PYELOGRAM/URETERAL RIGHT STENT PLACEMENT;  Surgeon: Bjorn Loser, MD;  Location: [facility];  Service: Urology;  Laterality: Right;   CYSTOSCOPY W/ URETERAL STENT PLACEMENT Right 06/04/2017   Procedure: CYSTOSCOPY WITHRIGHT RETROGRADE PYELOGRAMRIGHT /URETEREOSCOPY  STENT PLACEMENT;  Surgeon: Lucas Mallow, MD;  Location: [facility];   Service: Urology;  Laterality: Right;   ENDOMETRIAL ABLATION     HEMORRHOID SURGERY  2011   INCONTINENCE SURGERY     INNER EAR SURGERY     X 2   KNEE SURGERY     RECTAL PROLAPSE REPAIR     RETINAL DETACHMENT SURGERY Bilateral 2000   RETINAL DETACHMENT SURGERY Bilateral    TUBAL LIGATION     VEIN SURGERY Left 04/2010   Social History   Occupational History   Occupation: Restaurant manager, fast food: [employer]  Tobacco Use   Smoking status: Never Smoker   Smokeless tobacco: Never Used  Substance and Sexual Activity   Alcohol use: Yes    Frequency: Never    Comment: occ   Drug use: No    Comment: former maryjuana use- quit in 1995   Sexual activity: Yes

## 2018-11-28 ENCOUNTER — Telehealth: Payer: Self-pay | Admitting: Orthopaedic Surgery

## 2018-11-28 NOTE — Telephone Encounter (Signed)
Patient called asked if she can get a permanent handicap placard. Patient said she will have to walk 3 blocks to get to work because of a parking change. Patient said her place of employment is expanding and parking will be for the patients and she will have to park 3 blocks away. The number to contact patient is [phone]

## 2018-11-28 NOTE — Telephone Encounter (Signed)
I guess that will be fine given the severity of the arthritis and her knee combine with her hip issues and her back issues.

## 2018-11-28 NOTE — Telephone Encounter (Signed)
LMOM for patient letting her know I have signed handicap at front desk for her

## 2018-11-28 NOTE — Telephone Encounter (Signed)
Please advise if this is necessary

## 2019-01-20 ENCOUNTER — Encounter: Payer: Self-pay | Admitting: Family Medicine

## 2019-01-20 ENCOUNTER — Other Ambulatory Visit: Payer: Self-pay

## 2019-01-20 ENCOUNTER — Ambulatory Visit: Payer: PRIVATE HEALTH INSURANCE | Admitting: Family Medicine

## 2019-01-20 VITALS — BP 124/79 | HR 66 | Temp 98.3°F | Resp 16 | Ht 67.0 in | Wt 158.6 lb

## 2019-01-20 DIAGNOSIS — M542 Cervicalgia: Secondary | ICD-10-CM

## 2019-01-20 DIAGNOSIS — D1801 Hemangioma of skin and subcutaneous tissue: Secondary | ICD-10-CM

## 2019-01-20 DIAGNOSIS — I781 Nevus, non-neoplastic: Secondary | ICD-10-CM

## 2019-01-20 DIAGNOSIS — F411 Generalized anxiety disorder: Secondary | ICD-10-CM

## 2019-01-20 DIAGNOSIS — R251 Tremor, unspecified: Secondary | ICD-10-CM | POA: Diagnosis not present

## 2019-01-20 DIAGNOSIS — G2581 Restless legs syndrome: Secondary | ICD-10-CM

## 2019-01-20 DIAGNOSIS — Z79899 Other long term (current) drug therapy: Secondary | ICD-10-CM

## 2019-01-20 DIAGNOSIS — I10 Essential (primary) hypertension: Secondary | ICD-10-CM

## 2019-01-20 MED ORDER — NEBIVOLOL HCL 5 MG PO TABS: 5.0000 mg | ORAL_TABLET | Freq: Every day | ORAL | 1 refills | Status: DC

## 2019-01-20 MED ORDER — ESTROGENS, CONJUGATED 0.625 MG/GM VA CREA: TOPICAL_CREAM | VAGINAL | 3 refills | Status: DC

## 2019-01-20 MED ORDER — TRAVOPROST (BAK FREE) 0.004 % OP SOLN: 1.0000 [drp] | Freq: Every day | OPHTHALMIC | 3 refills | Status: DC

## 2019-01-20 MED ORDER — VITAMIN B-12 1000 MCG PO TABS: 1000.0000 ug | ORAL_TABLET | Freq: Every day | ORAL | 3 refills | Status: AC

## 2019-01-20 MED ORDER — OMEPRAZOLE 20 MG PO CPDR: 20.0000 mg | DELAYED_RELEASE_CAPSULE | Freq: Every day | ORAL | 3 refills | Status: DC

## 2019-01-20 MED ORDER — CLONAZEPAM 0.5 MG PO TABS: 0.5000 mg | ORAL_TABLET | Freq: Every day | ORAL | 3 refills | Status: DC

## 2019-01-20 MED ORDER — PRIMIDONE 50 MG PO TABS: ORAL_TABLET | ORAL | 0 refills | Status: DC

## 2019-01-20 MED ORDER — EPINEPHRINE 0.3 MG/0.3ML IJ SOAJ: 0.3000 mg | Freq: Once | INTRAMUSCULAR | 1 refills | Status: DC | PRN

## 2019-01-20 MED ORDER — DICLOFENAC SODIUM 1 % TD GEL: 2.0000 g | Freq: Four times a day (QID) | TRANSDERMAL | 3 refills | Status: DC

## 2019-01-20 NOTE — Patient Instructions (Addendum)
If you have lab work done today you will be contacted with your lab results within the next 2 weeks.  If you have not heard from [place] then please contact us. The fastest way to get your results is to register for My Chart.   IF you received an x-ray today, you will receive an invoice from [facility]. Please contact [facility] at [phone] with questions or concerns regarding your invoice.   IF you received labwork today, you will receive an invoice from [facility]. Please contact [facility] at [phone] with questions or concerns regarding your invoice.   Our billing staff will not be able to assist you with questions regarding bills from these companies.  You will be contacted with the lab results as soon as they are available. The fastest way to get your results is to activate your My Chart account. Instructions are located on the last page of this paperwork. If you have not heard from [place] regarding the results in 2 weeks, please contact this office.     Cherry Angioma A cherry angioma is a harmless growth on the skin. It is made up of blood vessels. Cherry angiomas can appear anywhere on the body, but they usually appear on the trunk and arms. What are the causes? The cause of this condition is not known, but it seems to be related to advancing age. What increases the risk? You are more likely to develop this condition if you:  Are over the age of 66.  Have a family member with this condition. What are the signs or symptoms?   Symptoms of this condition include harmless growths that are: ? Smooth, round, and red or purplish-red. ? As small as the tip of a pin or as big as a pencil eraser. How is this diagnosed? This condition is diagnosed with a skin exam. Rarely, a piece of the cherry angioma may be removed for testing if it is not clear that the growth is a cherry angioma. How is this treated? Treatment is not needed for this condition. If you do not  like the way a cherry angioma looks, you may have it removed. Removal methods include:  A method where heat is used to burn the cherry angioma off the skin (electrocautery).  A method where the cherry angioma is frozen (cryosurgery). This causes it to eventually fall off the skin.  A method where a laser is used to destroy the red blood cells and blood vessels in the angioma (laser therapy).  A minor surgical procedure. A scalpel is used to remove the cherry angioma off the skin. A cherry angioma may come back after it has been removed. Follow these instructions at home:  If you have a cherry angioma removed, keep the area clean and follow any other care instructions as told by your health care provider.  Take over-the-counter and prescription medicines only as told by your health care provider.  Keep all follow-up visits as told by your health care provider. This is important. Summary  A cherry angioma is a harmless growth on the skin that is made up of blood vessels.  Treatment is not needed for this condition.  If you do not like the way a cherry angioma looks, you may have it removed.  If you have a cherry angioma removed, follow any care instructions as told by your health care provider. This information is not intended to replace advice given to you by your health care provider. Make sure  you discuss any questions you have with your health care provider. Document Released: 08/14/2001 Document Revised: 12/25/2017 Document Reviewed: 12/25/2017 Elsevier Patient Education  2020 [employer].

## 2019-01-20 NOTE — Progress Notes (Signed)
Established Patient Office Visit  Subjective:  Patient ID: Sharon [name], female    DOB: 1965-09-07  Age: 53 y.o. MRN: [id number]  CC:  Chief Complaint  Patient presents with  . Medication Refill    acyclovir, cholecalciferol, clonazepam, cyanocobalamin, diclofenac gel, epinephrine pen, estrogens, conjugatec, nebivolol, nitroglycerin ointment, omeprazole, primidone, tramadol and travoprost.  . Nevus    unsure of how long its been there, would like it checked.    HPI Sharon [name] presents for   Extensive review of the medical record with the patient   Neurology was working the patient up in 2015-2017 She reports that she was being evaluated for sleep disturbance OSA as well as restless leg syndrome At that time she had a low ferritin  She reports that she is currently anemic as told to her by her Gynec\ She is not taking iron but is taking a calcium supplement   Hypertension: Patient here for follow-up of elevated blood pressure. 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  Blood pressure is well controlled at home. Cardiac symptoms none. Patient denies chest pain, claudication, exertional chest pressure/discomfort, irregular heart beat and near-syncope.  Cardiovascular risk factors: hypertension. Use of agents associated with hypertension: none. History of target organ damage: none. She is taking the Bystolic 14m BP Readings from Last 3 Encounters:  01/20/19 124/79  07/22/18 115/76  07/19/18 (!) 143/82   Restless leg syndrome She is taking klonopin at night and RLS as well  She reports that she is also been on clonazepam for 10 years prescribed by Dr. DEverlene FarrierShe states that it is the only thing that helps her anxiety and RLS She reports that it feels like something is crawling under her skin  Hormone Replacement and History of DVT She reports that she was started on a medication for hot flashes She states the gets a  low dose as needed   Past Medical History:  Diagnosis Date  . Allergy   . Anal fissure   . Anxiety   . Arthritis   . Blood clot in vein    left leg  . Detached retina    BOTH SCLERA BUCKLE BOTH EYES  . DVT (deep venous thrombosis) (HCC)    left leg  . Endometriosis   . Family history of adverse reaction to anesthesia    DAUGHTER POST OP PONV  . GERD (gastroesophageal reflux disease)   . Glaucoma    RIGHT WORST THAN LEFT  . Heart murmur   . History of hemorrhoids   . Hypertension   . IBS (irritable bowel syndrome)   . Perforated eardrum, right    SMALL HOLE  . PONV (postoperative nausea and vomiting)   . Pulmonary embolism ([place] 6 YRS [facility]    Past Surgical History:  Procedure Laterality Date  . ABDOMINAL HYSTERECTOMY    . CATARACT EXTRACTION Bilateral 2015  . CYSTOSCOPY W/ URETERAL STENT PLACEMENT Right 05/23/2017   Procedure: CYSTOSCOPY WITH RETROGRADE PYELOGRAM/URETERAL RIGHT STENT PLACEMENT;  Surgeon: MBjorn Loser MD;  Location: [facility];  Service: Urology;  Laterality: Right;  . CYSTOSCOPY W/ URETERAL STENT PLACEMENT Right 06/04/2017   Procedure: CYSTOSCOPY WITHRIGHT RETROGRADE PYELOGRAMRIGHT /URETEREOSCOPY  STENT PLACEMENT;  Surgeon: BLucas Mallow MD;  [facility]  Service: Urology;  Laterality: Right;  . ENDOMETRIAL ABLATION    . HS[place] 2011  . INCONTINENCE SURGERY    . INNER EAR SURGERY  X 2  . KNEE SURGERY    . RECTAL PROLAPSE REPAIR    . RETINAL DETACHMENT SURGERY Bilateral 2000  . RETINAL DETACHMENT SURGERY Bilateral   . TUBAL LIGATION    . VEIN SURGERY Left 04/2010    Family History  Problem Relation Age of Onset  . Emphysema Mother        smoker  . Allergies Mother   . Asthma Mother   . Heart disease Mother        AMI as cause of death  . COPD Mother   . Asthma Father   . Heart disease Father        AMI as cause of death  . Diabetes Father   . Asthma Brother   . Heart disease Brother 26        heart failure  . Lung cancer Maternal Grandfather        was a smoker  . Cancer Maternal Grandfather        lung  . Heart disease Paternal Grandmother   . Heart disease Paternal Grandfather   . Colon cancer Neg Hx     Social History   Socioeconomic History  . Marital status: Divorced    Spouse name: Not on file  . Number of children: 1  . Years of education: Not on file  . Highest education level: Not on file  Occupational History  . Occupation: Restaurant manager, fast food: [employer]  Social Needs  . Financial resource strain: Not on file  . Food insecurity    Worry: Not on file    Inability: Not on file  . Transportation needs    Medical: Not on file    Non-medical: Not on file  Tobacco Use  . Smoking status: Never Smoker  . Smokeless tobacco: Never Used  Substance and Sexual Activity  . Alcohol use: Yes    Frequency: Never    Comment: occ  . Drug use: No    Comment: former maryjuana use- quit in 1995  . Sexual activity: Yes  Lifestyle  . Physical activity    Days per week: Not on file    Minutes per session: Not on file  . Stress: Not on file  Relationships  . Social Herbalist on phone: Not on file    Gets together: Not on file    Attends religious service: Not on file    Active member of club or organization: Not on file    Attends meetings of clubs or organizations: Not on file    Relationship status: Not on file  . Intimate partner violence    Fear of current or ex partner: Not on file    Emotionally abused: Not on file    Physically abused: Not on file    Forced sexual activity: Not on file  Other Topics Concern  . Not on file  Social History Narrative   Marital status: divorced x 2; dating seriously x 3 years.  Boyfriend with HIV.       Children:  1 daughter (74); no grandchildren      Lives:  With boyfriend.        Employment: works at [employer]      Tobacco: none      Alcohol: rarely; socially      Exercise: none;  has treadmill and elliptical and bikes    Outpatient Medications Prior to Visit  Medication Sig Dispense Refill  . acetaminophen (TYLENOL)  500 MG tablet Take 500 mg by mouth 2 (two) times daily.    Marland Kitchen acyclovir (ZOVIRAX) 400 MG tablet Take 1 tablet (400 mg total) by mouth at bedtime. 90 tablet 3  . Ascorbic Acid (VITAMIN C) 1000 MG tablet Take 1,000 mg by mouth daily.    . Cholecalciferol (VITAMIN D) 2000 UNITS tablet Take 2,000 Units by mouth daily.    Marland Kitchen ibuprofen (ADVIL,MOTRIN) 200 MG tablet Take by mouth.    . Magnesium 500 MG CAPS Take 500 mg by mouth daily.     . Magnesium Oxide, Antacid, 500 MG CAPS Take by mouth.    . Multiple Vitamin (MULTI-VITAMINS) TABS Take by mouth.    . Nitroglycerin (RECTIV) 0.4 % OINT Place 0.4 inches rectally 2 (two) times daily as needed (For anal fissures.). 30 g 1  . ondansetron (ZOFRAN) 8 MG tablet Take 1 tablet (8 mg total) by mouth every 8 (eight) hours as needed for nausea or vomiting. 20 tablet 0  . Polyethylene Glycol 3350 (MIRALAX PO) Take 17 g by mouth daily as needed (For constipation.).     Marland Kitchen zinc gluconate 50 MG tablet Take 50 mg by mouth daily.    . clonazePAM (KLONOPIN) 0.5 MG tablet Take 1 tablet (0.5 mg total) by mouth at bedtime. 30 tablet 5  . conjugated estrogens (PREMARIN) vaginal cream Insert pea-sized amount into vagina every other night    . diclofenac sodium (VOLTAREN) 1 % GEL Apply 2 g topically 4 (four) times daily. 100 g 3  . EPINEPHrine 0.3 mg/0.3 mL IJ SOAJ injection Inject 0.3 mLs (0.3 mg total) into the muscle once as needed (For anaphylaxis.). 1 Device 1  . nebivolol (BYSTOLIC) 5 MG tablet Take 1 tablet (5 mg total) by mouth daily. 90 tablet 1  . omeprazole (PRILOSEC) 20 MG capsule Take 1 capsule (20 mg total) by mouth daily. 30 capsule 3  . PREMARIN vaginal cream   3  . primidone (MYSOLINE) 50 MG tablet Take one tablet in the morning for essential tremors (Patient taking differently: Take two tablet in the morning for  essential tremors) 90 tablet 0  . traMADol (ULTRAM) 50 MG tablet Take by mouth.    . Travoprost, BAK Free, (TRAVATAN Z) 0.004 % SOLN ophthalmic solution Place 1 drop into both eyes at bedtime.     . vitamin B-12 (CYANOCOBALAMIN) 1000 MCG tablet Take 1,000 mcg by mouth daily.    . celecoxib (CELEBREX) 200 MG capsule Take 1 capsule (200 mg total) by mouth daily. (Patient not taking: Reported on 01/20/2019) 90 capsule 1  . levofloxacin (LEVAQUIN) 500 MG tablet Take 1 tablet (500 mg total) by mouth daily. (Patient not taking: Reported on 01/20/2019) 7 tablet 0  . oxyCODONE-acetaminophen (PERCOCET/ROXICET) 5-325 MG tablet Take 1-2 tablets by mouth every 4 (four) hours as needed (For pain.).   0  . pantoprazole (PROTONIX) 40 MG tablet Take 1 tablet (40 mg total) by mouth at bedtime. (Patient not taking: Reported on 01/20/2019) 90 tablet 3  . ranitidine (ZANTAC) 150 MG tablet Take 2 tablets in the morning as needed for reflux (Patient not taking: Reported on 01/20/2019) 180 tablet 1   No facility-administered medications prior to visit.     Allergies  Allergen Reactions  . Dextromethorphan Polistirex Er Other (See Comments)    Pt states caused "a lump" in her throat, making it difficult to swallow  . Diflucan [Fluconazole]     Primidone interaction  . Hydrocodone Nausea And Vomiting  . Oxycodone-Acetaminophen Other (  See Comments)    Made fingers tingle and go numb, started "feeling weird".   . Penicillins Other (See Comments)    Reaction unknown from childhood Has patient had a PCN reaction causing immediate rash, facial/tongue/throat swelling, SOB or lightheadedness with hypotension: unknown Has patient had a PCN reaction causing severe rash involving mucus membranes or skin necrosis: unknown  Has patient had a PCN reaction that required hospitalization: no Has patient had a PCN reaction occurring within the last 10 years: no If all of the above answers are "NO", then may proceed with Cephalosporin  use.   . Tape Itching and Other (See Comments)    Bandaids - leaves red marks    ROS Review of Systems    Objective:    Physical Exam  BP 124/79 (BP Location: Left Arm, Patient Position: Sitting, Cuff Size: Normal)   Pulse 66   Temp 98.3 F (36.8 C) (Oral)   Resp 16   Ht _0  (1.702 m)   Wt 158 lb 9.6 oz (71.9 kg)   LMP 10/11/2010   SpO2 98%   BMI 24.84 kg/m  Wt Readings from Last 3 Encounters:  01/20/19 158 lb 9.6 oz (71.9 kg)  07/22/18 138 lb (62.6 kg)  07/19/18 139 lb (63 kg)     Health Maintenance Due  Topic Date Due  . PAP SMEAR-Modifier  01/03/2018  . INFLUENZA VACCINE  01/18/2019    There are no preventive care reminders to display for this patient.  Lab Results  Component Value Date   TSH 1.700 07/04/2018   Lab Results  Component Value Date   WBC 4.5 07/19/2018   HGB 12.4 07/19/2018   HCT 36.5 07/19/2018   MCV 88 07/19/2018   PLT 277 07/19/2018   Lab Results  Component Value Date   NA 144 07/04/2018   K 4.4 07/04/2018   CO2 22 07/04/2018   GLUCOSE 74 07/04/2018   BUN 14 07/04/2018   CREATININE 0.79 07/04/2018   BILITOT 0.4 07/04/2018   ALKPHOS 74 07/04/2018   AST 10 07/04/2018   ALT 8 07/04/2018   PROT 7.0 07/04/2018   ALBUMIN 4.5 07/04/2018   CALCIUM 9.8 07/04/2018   ANIONGAP 9 08/17/2017   GFR 108.07 03/09/2015   Lab Results  Component Value Date   CHOL 154 07/04/2018   Lab Results  Component Value Date   HDL 58 07/04/2018   Lab Results  Component Value Date   LDLCALC 80 07/04/2018   Lab Results  Component Value Date   TRIG 81 07/04/2018   Lab Results  Component Value Date   CHOLHDL 2.7 07/04/2018   Lab Results  Component Value Date   HGBA1C 5.2 01/01/2018      Assessment & Plan:   Problem List Items Addressed This Visit      Cardiovascular and Mediastinum   Hypertension   Relevant Medications   nebivolol (BYSTOLIC) 5 MG tablet   EPINEPHrine 0.3 mg/0.3 mL IJ SOAJ injection     Other   GAD  (generalized anxiety disorder)   Relevant Medications   clonazePAM (KLONOPIN) 0.5 MG tablet   Tremor - Primary   Relevant Orders   Ambulatory referral to Neurology   Ferritin   Cervicalgia   Relevant Orders   Ambulatory referral to Neurology    Other Visit Diagnoses    Restless leg syndrome       Relevant Orders   Ferritin   CBC   CMP14+EGFR   Cherry angioma  Relevant Medications   nebivolol (BYSTOLIC) 5 MG tablet   EPINEPHrine 0.3 mg/0.3 mL IJ SOAJ injection   Polypharmacy          Meds ordered this encounter  Medications  . omeprazole (PRILOSEC) 20 MG capsule    Sig: Take 1 capsule (20 mg total) by mouth daily.    Dispense:  90 capsule    Refill:  3  . clonazePAM (KLONOPIN) 0.5 MG tablet    Sig: Take 1 tablet (0.5 mg total) by mouth at bedtime.    Dispense:  30 tablet    Refill:  3    This request is for a new prescription for a controlled substance as required by Federal/State law.  . nebivolol (BYSTOLIC) 5 MG tablet    Sig: Take 1 tablet (5 mg total) by mouth daily.    Dispense:  90 tablet    Refill:  1  . diclofenac sodium (VOLTAREN) 1 % GEL    Sig: Apply 2 g topically 4 (four) times daily.    Dispense:  100 g    Refill:  3  . primidone (MYSOLINE) 50 MG tablet    Sig: Take two tablet in the morning for essential tremors. Follow up with Neurology    Dispense:  180 tablet    Refill:  0  . EPINEPHrine 0.3 mg/0.3 mL IJ SOAJ injection    Sig: Inject 0.3 mLs (0.3 mg total) into the muscle once as needed (For anaphylaxis.).    Dispense:  1 each    Refill:  1  . vitamin B-12 (CYANOCOBALAMIN) 1000 MCG tablet    Sig: Take 1 tablet (1,000 mcg total) by mouth daily.    Dispense:  30 tablet    Refill:  3  . Travoprost, BAK Free, (TRAVATAN Z) 0.004 % SOLN ophthalmic solution    Sig: Place 1 drop into both eyes at bedtime.    Dispense:  5 mL    Refill:  3  . DISCONTD: conjugated estrogens (PREMARIN) vaginal cream    Sig: Insert pea-sized amount into vagina  every other night    Dispense:  42.5 g    Refill:  3    Cherry angioma-  See Derm Polypharmacy - reconciled meds, will not give tramadol with klonopin GERD- refilled prilosec, avoid NSAID htn- stable and refilled meds, labs checked HRT- see Gyne, I DO NOT PRESCRIBE HRT in patients with history of VTE/DVT Tremors- refilled klonopin and primidone, follow up with Neurology for management of Tremors, sent to [place] per pt recs    Follow-up: No follow-ups on file.    Forrest Moron, MD

## 2019-01-21 LAB — CMP14+EGFR
ALT: 18 IU/L (ref 0–32)
AST: 13 IU/L (ref 0–40)
Albumin/Globulin Ratio: 1.8 (ref 1.2–2.2)
Albumin: 4.4 g/dL (ref 3.8–4.9)
Alkaline Phosphatase: 80 IU/L (ref 39–117)
BUN/Creatinine Ratio: 38 — ABNORMAL HIGH (ref 9–23)
BUN: 24 mg/dL (ref 6–24)
Bilirubin Total: 0.3 mg/dL (ref 0.0–1.2)
CO2: 23 mmol/L (ref 20–29)
Calcium: 9.4 mg/dL (ref 8.7–10.2)
Chloride: 102 mmol/L (ref 96–106)
Creatinine, Ser: 0.64 mg/dL (ref 0.57–1.00)
GFR calc Af Amer: 117 mL/min/{1.73_m2} (ref >59)
GFR calc non Af Amer: 101 mL/min/{1.73_m2} (ref >59)
Globulin, Total: 2.5 g/dL (ref 1.5–4.5)
Glucose: 92 mg/dL (ref 65–99)
Potassium: 4.1 mmol/L (ref 3.5–5.2)
Sodium: 141 mmol/L (ref 134–144)
Total Protein: 6.9 g/dL (ref 6.0–8.5)

## 2019-01-21 LAB — CBC
Hematocrit: 35.5 % (ref 34.0–46.6)
Hemoglobin: 12 g/dL (ref 11.1–15.9)
MCH: 29.3 pg (ref 26.6–33.0)
MCHC: 33.8 g/dL (ref 31.5–35.7)
MCV: 87 fL (ref 79–97)
Platelets: 293 10*3/uL (ref 150–450)
RBC: 4.09 x10E6/uL (ref 3.77–5.28)
RDW: 12.6 % (ref 11.7–15.4)
WBC: 5.5 10*3/uL (ref 3.4–10.8)

## 2019-01-21 LAB — FERRITIN: Ferritin: 39 ng/mL (ref 15–150)

## 2019-01-22 ENCOUNTER — Encounter: Payer: Self-pay | Admitting: Radiology

## 2019-01-28 ENCOUNTER — Ambulatory Visit (INDEPENDENT_AMBULATORY_CARE_PROVIDER_SITE_OTHER): Payer: PRIVATE HEALTH INSURANCE | Admitting: Orthopaedic Surgery

## 2019-01-28 ENCOUNTER — Encounter: Payer: Self-pay | Admitting: Neurology

## 2019-01-28 ENCOUNTER — Encounter: Payer: Self-pay | Admitting: Orthopaedic Surgery

## 2019-01-28 DIAGNOSIS — M1712 Unilateral primary osteoarthritis, left knee: Secondary | ICD-10-CM

## 2019-01-28 DIAGNOSIS — G8929 Other chronic pain: Secondary | ICD-10-CM

## 2019-01-28 DIAGNOSIS — M25562 Pain in left knee: Secondary | ICD-10-CM

## 2019-01-28 DIAGNOSIS — M7541 Impingement syndrome of right shoulder: Secondary | ICD-10-CM

## 2019-01-28 DIAGNOSIS — M25512 Pain in left shoulder: Secondary | ICD-10-CM

## 2019-01-28 DIAGNOSIS — M25511 Pain in right shoulder: Secondary | ICD-10-CM | POA: Diagnosis not present

## 2019-01-28 MED ORDER — METHYLPREDNISOLONE ACETATE 40 MG/ML IJ SUSP
40.0000 mg | INTRAMUSCULAR | Status: AC | PRN
  Administered 2019-01-28: 40 mg via INTRA_ARTICULAR

## 2019-01-28 MED ORDER — LIDOCAINE HCL 1 % IJ SOLN
3.0000 mL | INTRAMUSCULAR | Status: AC | PRN
  Administered 2019-01-28: 3 mL

## 2019-01-28 NOTE — Progress Notes (Signed)
Office Visit Note   Patient: Sharon [name]           Date of Birth: 1966/01/13           MRN: [id number] Visit Date: 01/28/2019              Requested by: Forrest Moron, MD [place],  [place] [address] PCP: Forrest Moron, MD   Assessment & Plan: Visit Diagnoses:  1. Chronic right shoulder pain   2. Impingement syndrome of right shoulder   3. Chronic pain of left knee   4. Unilateral primary osteoarthritis, left knee   5. Chronic left shoulder pain     Plan: I did provide steroid injections in her right shoulder subacromial space and her left knee joint today per her wishes.  She understands the risk and benefits of injections having had them before.  I do feel at some point she may need a right shoulder arthroscopy with debridement and subacromial decompression as well as a left knee replacement.  I talked her about these in detail.  All question concerns were answered and addressed.  Follow-up as otherwise as needed.  Follow-Up Instructions: Return if symptoms worsen or fail to improve.   Orders:  Orders Placed This Encounter  Procedures  . Large Joint Inj  . Large Joint Inj   No orders of the defined types were placed in this encounter.     Procedures: Large Joint Inj: R subacromial bursa on 01/28/2019 8:51 AM Indications: pain and diagnostic evaluation Details: 22 G 1.5 in needle  Arthrogram: No  Medications: 3 mL lidocaine 1 %; 40 mg methylPREDNISolone acetate 40 MG/ML Outcome: tolerated well, no immediate complications Procedure, treatment alternatives, risks and benefits explained, specific risks discussed. Consent was given by the patient. Immediately prior to procedure a time out was called to verify the correct patient, procedure, equipment, support staff and site/side marked as required. Patient was prepped and draped in the usual sterile fashion.   Large Joint Inj: L knee on 01/28/2019 8:51 AM Indications: diagnostic evaluation and pain  Details: 22 G 1.5 in needle, superolateral approach  Arthrogram: No  Medications: 3 mL lidocaine 1 %; 40 mg methylPREDNISolone acetate 40 MG/ML Outcome: tolerated well, no immediate complications Procedure, treatment alternatives, risks and benefits explained, specific risks discussed. Consent was given by the patient. Immediately prior to procedure a time out was called to verify the correct patient, procedure, equipment, support staff and site/side marked as required. Patient was prepped and draped in the usual sterile fashion.       Clinical Data: No additional findings.   Subjective: Chief Complaint  Patient presents with  . Right Shoulder - Follow-up  . Left Knee - Follow-up  The patient comes today with chronic right shoulder and chronic left knee pain.  We have injected these areas before.  We have actually performed arthroscopic surgery on her left knee.  She does have tricompartmental arthritic changes on top of a meniscal tear.  The meniscal tear was debrided.  An MRI of her right shoulder showed severe tendinitis and tendinopathy of the rotator cuff with bursitis as well.  Injections of helped in the past.  She is having a lot of difficulty with sleeping at night due to the pain mainly in her right shoulder.  She denies any acute changes in her medical status.  She does wear compressive stockings on her legs and has had problems with varicose veins in the past.  She has  had vein surgery on her left leg before.  HPI  Review of Systems She currently denies any headache, chest pain, shortness of breath, fever, chills, nausea, vomiting  Objective: Vital Signs: LMP 10/11/2010   Physical Exam She is alert and orient x3 and in no acute distress Ortho Exam Examination of her right shoulder shows good range of motion overall except for internal rotation with adduction and reaching behind her is only to the lower lumbar spine.  She does have positive Neer and Hawkins signs and  evidence of impingement of the right shoulder.  Left shoulder moves better.  Her left knee is globally swollen with painful range of motion.  The left knee is ligamentously stable. Specialty Comments:  No specialty comments available.  Imaging: No results found.   PMFS History: Patient Active Problem List   Diagnosis Date Noted  . Impingement syndrome of right shoulder 10/28/2018  . Chronic pain of left knee 03/27/2018  . Chronic right shoulder pain 03/27/2018  . Cervicalgia 03/27/2018  . Status post arthroscopy of left knee 08/06/2017  . Unilateral primary osteoarthritis, left knee 08/06/2017  . Tremor 07/10/2017  . Other meniscus derangements, posterior horn of medial meniscus, left knee 06/28/2017  . Ureteral stone 05/23/2017  . History of sepsis 05/23/2017  . Acute pain of left knee 04/09/2017  . Trochanteric bursitis, right hip 09/27/2016  . GAD (generalized anxiety disorder) 05/31/2014  . Rectocele 08/21/2011  . Uterovaginal prolapse 08/21/2011  . Dyspnea 10/13/2010  . Hypertension 10/13/2010   Past Medical History:  Diagnosis Date  . Allergy   . Anal fissure   . Anxiety   . Arthritis   . Blood clot in vein    left leg  . Detached retina    BOTH SCLERA BUCKLE BOTH EYES  . DVT (deep venous thrombosis) (HCC)    left leg  . Endometriosis   . Family history of adverse reaction to anesthesia    DAUGHTER POST OP PONV  . GERD (gastroesophageal reflux disease)   . Glaucoma    RIGHT WORST THAN LEFT  . Heart murmur   . History of hemorrhoids   . Hypertension   . IBS (irritable bowel syndrome)   . Perforated eardrum, right    SMALL HOLE  . PONV (postoperative nausea and vomiting)   . Pulmonary embolism ([place]) 6 YRS AGO    Family History  Problem Relation Age of Onset  . Emphysema Mother        smoker  . Allergies Mother   . Asthma Mother   . Heart disease Mother        AMI as cause of death  . COPD Mother   . Asthma Father   . Heart disease Father         AMI as cause of death  . Diabetes Father   . Asthma Brother   . Heart disease Brother 73       heart failure  . Lung cancer Maternal Grandfather        was a smoker  . Cancer Maternal Grandfather        lung  . Heart disease Paternal Grandmother   . Heart disease Paternal Grandfather   . Colon cancer Neg Hx     Past Surgical History:  Procedure Laterality Date  . ABDOMINAL HYSTERECTOMY    . CATARACT EXTRACTION Bilateral 2015  . CYSTOSCOPY W/ URETERAL STENT PLACEMENT Right 05/23/2017   Procedure: CYSTOSCOPY WITH RETROGRADE PYELOGRAM/URETERAL RIGHT STENT PLACEMENT;  Surgeon: Bjorn Loser, MD;  Location: [facility];  Service: Urology;  Laterality: Right;  . CYSTOSCOPY W/ URETERAL STENT PLACEMENT Right 06/04/2017   Procedure: CYSTOSCOPY WITHRIGHT RETROGRADE PYELOGRAMRIGHT /URETEREOSCOPY  STENT PLACEMENT;  Surgeon: Lucas Mallow, MD;  Location: [facility];  Service: Urology;  Laterality: Right;  . ENDOMETRIAL ABLATION    . [place]  2011  . INCONTINENCE SURGERY    . INNER EAR SURGERY     X 2  . KNEE SURGERY    . RECTAL PROLAPSE REPAIR    . RETINAL DETACHMENT SURGERY Bilateral 2000  . RETINAL DETACHMENT SURGERY Bilateral   . TUBAL LIGATION    . VEIN SURGERY Left 04/2010   Social History   Occupational History  . Occupation: Restaurant manager, fast food: [employer]  Tobacco Use  . Smoking status: Never Smoker  . Smokeless tobacco: Never Used  Substance and Sexual Activity  . Alcohol use: Yes    Frequency: Never    Comment: occ  . Drug use: No    Comment: former maryjuana use- quit in 1995  . Sexual activity: Yes

## 2019-02-04 ENCOUNTER — Telehealth: Payer: Self-pay | Admitting: Orthopaedic Surgery

## 2019-02-04 NOTE — Telephone Encounter (Signed)
Patient called and stated that she is ready to have knee replacement.  Please call patient to advise.  [phone]

## 2019-02-04 NOTE — Telephone Encounter (Signed)
See below

## 2019-02-05 NOTE — Telephone Encounter (Signed)
I will fill out a sheet for a left total knee.

## 2019-02-14 ENCOUNTER — Telehealth: Payer: Self-pay | Admitting: Family Medicine

## 2019-02-14 DIAGNOSIS — K2100 Gastro-esophageal reflux disease with esophagitis, without bleeding: Secondary | ICD-10-CM

## 2019-02-14 NOTE — Telephone Encounter (Signed)
Pt called stating the omeprazole (PRILOSEC) 20 MG capsule is not working for her. Pt states the acid I causing her to vomit and have diarreah. Pt is requesting to have something new sent in for her today before she leaves on vacation tomorrow. Please advise.    [id number], [place] - [address]  [place] [address]  Phone: [phone] Fax: [phone]  Not a 24 hour pharmacy; exact hours not known.

## 2019-02-14 NOTE — Telephone Encounter (Signed)
Would you like to prescribe something different for this PT?

## 2019-02-18 NOTE — Telephone Encounter (Signed)
Please notify her to increase the omeprazole to 40mg  or 2 capsules.  If that does not work she will need to see the Gastroenterologist.

## 2019-02-19 NOTE — Telephone Encounter (Signed)
Spoke with pt and she states she would prefer to just stop taking that medication and would like for you to place a referral at this time.

## 2019-02-26 NOTE — Addendum Note (Signed)
Addended by: Delia Chimes A on: 02/26/2019 11:08 PM   Modules accepted: Orders

## 2019-03-02 NOTE — Progress Notes (Signed)
NEUROLOGY CONSULTATION NOTE  Sharon [name] MRN: [id number] DOB: September 08, 1965  Referring provider: Delia Chimes, MD Primary care provider: Delia Chimes, MD  Reason for consult:  Tremor, cervicalgia  HISTORY OF PRESENT ILLNESS: Sharon [name] is a 53 year old female with generalized anxiety disorder, restless leg syndrome, hypertension, glaucoma, arthritis, and history of PE and DVT in left leg who presents for cervicalgia and tremor.  History supplemented by referring provider note.  She started having tremors around 2015.  It involves both hands.  It interferes with daily tasks.  She drops a lot.  She has occasional trouble with utensils or writing.  Nothing particular makes it worse.  Steadying her wrist on the arm rest will suppress it.    Her mother and grandmother had tremor.  She has chronic generalized pain as well as neck pain.  She reports a creepy crawly feeling under her skin, including back and down the legs.  She has been diagnosed with restless leg syndrome.  MRI of cervical spine from 04/12/18 was personally reviewed and showed cervical spondylosis, degenerative disc disease and congenitally short pedicles, causing mild impingement at C3-4 on left, C6-7 on right and C7-T1 on left.  CT of right shoulder on 04/11/18 showed severe supraspinatus and infraspinatus tendinopathy without tear as well as small type 2 SLAP tear.  She notes a 'pinching" pain radiating down the lateral arm and into the fingers with associated numbness and tingling in the fingers.  MRI lumbar spine from 04/13/18 showed L4-L5 right foraminal disc protrusion that may be causing right L4 nerve root irritation.  L5-S1 moderate face arthrosis and small central protrusion with annular fissure.  For tremor, she was started on primidone 100mg  every morning.   For anxiety and RLS, she takes clonazepam 0.5mg  at bedtime.  She takes Bystolic for blood pressure.  01/20/19 LABS:  Ferritin 39.  PAST MEDICAL HISTORY:  Past Medical History:  Diagnosis Date  . Allergy   . Anal fissure   . Anxiety   . Arthritis   . Blood clot in vein    left leg  . Detached retina    BOTH SCLERA BUCKLE BOTH EYES  . DVT (deep venous thrombosis) (HCC)    left leg  . Endometriosis   . Family history of adverse reaction to anesthesia    DAUGHTER POST OP PONV  . GERD (gastroesophageal reflux disease)   . Glaucoma    RIGHT WORST THAN LEFT  . Heart murmur   . History of hemorrhoids   . Hypertension   . IBS (irritable bowel syndrome)   . Perforated eardrum, right    SMALL HOLE  . PONV (postoperative nausea and vomiting)   . Pulmonary embolism ([place]) 6 YRS AGO    PAST SURGICAL HISTORY: Past Surgical History:  Procedure Laterality Date  . ABDOMINAL HYSTERECTOMY    . CATARACT EXTRACTION Bilateral 2015  . CYSTOSCOPY W/ URETERAL STENT PLACEMENT Right 05/23/2017   Procedure: CYSTOSCOPY WITH RETROGRADE PYELOGRAM/URETERAL RIGHT STENT PLACEMENT;  Surgeon: Bjorn Loser, MD;  Location: [facility];  Service: Urology;  Laterality: Right;  . CYSTOSCOPY W/ URETERAL STENT PLACEMENT Right 06/04/2017   Procedure: CYSTOSCOPY WITHRIGHT RETROGRADE PYELOGRAMRIGHT /URETEREOSCOPY  STENT PLACEMENT;  Surgeon: Lucas Mallow, MD;  Location: [facility];  Service: Urology;  Laterality: Right;  . ENDOMETRIAL ABLATION    . [place]  2011  . INCONTINENCE SURGERY    . INNER EAR SURGERY     X 2  . KNEE SURGERY    .  RECTAL PROLAPSE REPAIR    . RETINAL DETACHMENT SURGERY Bilateral 2000  . RETINAL DETACHMENT SURGERY Bilateral   . TUBAL LIGATION    . VEIN SURGERY Left 04/2010    MEDICATIONS: Current Outpatient Medications on File Prior to Visit  Medication Sig Dispense Refill  . acetaminophen (TYLENOL) 500 MG tablet Take 500 mg by mouth 2 (two) times daily.    Marland Kitchen acyclovir (ZOVIRAX) 400 MG tablet Take 1 tablet (400 mg total) by mouth at bedtime. 90 tablet 3  . Ascorbic Acid (VITAMIN C) 1000 MG tablet Take 1,000  mg by mouth daily.    . Cholecalciferol (VITAMIN D) 2000 UNITS tablet Take 2,000 Units by mouth daily.    . clonazePAM (KLONOPIN) 0.5 MG tablet Take 1 tablet (0.5 mg total) by mouth at bedtime. 30 tablet 3  . diclofenac sodium (VOLTAREN) 1 % GEL Apply 2 g topically 4 (four) times daily. 100 g 3  . EPINEPHrine 0.3 mg/0.3 mL IJ SOAJ injection Inject 0.3 mLs (0.3 mg total) into the muscle once as needed (For anaphylaxis.). 1 each 1  . ibuprofen (ADVIL,MOTRIN) 200 MG tablet Take by mouth.    . Magnesium 500 MG CAPS Take 500 mg by mouth daily.     . Magnesium Oxide, Antacid, 500 MG CAPS Take by mouth.    . Multiple Vitamin (MULTI-VITAMINS) TABS Take by mouth.    . nebivolol (BYSTOLIC) 5 MG tablet Take 1 tablet (5 mg total) by mouth daily. 90 tablet 1  . Nitroglycerin (RECTIV) 0.4 % OINT Place 0.4 inches rectally 2 (two) times daily as needed (For anal fissures.). 30 g 1  . omeprazole (PRILOSEC) 20 MG capsule Take 1 capsule (20 mg total) by mouth daily. 90 capsule 3  . ondansetron (ZOFRAN) 8 MG tablet Take 1 tablet (8 mg total) by mouth every 8 (eight) hours as needed for nausea or vomiting. 20 tablet 0  . Polyethylene Glycol 3350 (MIRALAX PO) Take 17 g by mouth daily as needed (For constipation.).     Marland Kitchen primidone (MYSOLINE) 50 MG tablet Take two tablet in the morning for essential tremors. Follow up with Neurology 180 tablet 0  . Travoprost, BAK Free, (TRAVATAN Z) 0.004 % SOLN ophthalmic solution Place 1 drop into both eyes at bedtime. 5 mL 3  . vitamin B-12 (CYANOCOBALAMIN) 1000 MCG tablet Take 1 tablet (1,000 mcg total) by mouth daily. 30 tablet 3  . zinc gluconate 50 MG tablet Take 50 mg by mouth daily.     No current facility-administered medications on file prior to visit.     ALLERGIES: Allergies  Allergen Reactions  . Dextromethorphan Polistirex Er Other (See Comments)    Pt states caused "a lump" in her throat, making it difficult to swallow  . Diflucan [Fluconazole]     Primidone  interaction  . Hydrocodone Nausea And Vomiting  . Oxycodone-Acetaminophen Other (See Comments)    Made fingers tingle and go numb, started "feeling weird".   . Penicillins Other (See Comments)    Reaction unknown from childhood Has patient had a PCN reaction causing immediate rash, facial/tongue/throat swelling, SOB or lightheadedness with hypotension: unknown Has patient had a PCN reaction causing severe rash involving mucus membranes or skin necrosis: unknown  Has patient had a PCN reaction that required hospitalization: no Has patient had a PCN reaction occurring within the last 10 years: no If all of the above answers are "NO", then may proceed with Cephalosporin use.   . Tape Itching and Other (See Comments)  Bandaids - leaves red marks    FAMILY HISTORY: Family History  Problem Relation Age of Onset  . Emphysema Mother        smoker  . Allergies Mother   . Asthma Mother   . Heart disease Mother        AMI as cause of death  . COPD Mother   . Asthma Father   . Heart disease Father        AMI as cause of death  . Diabetes Father   . Asthma Brother   . Heart disease Brother 1       heart failure  . Lung cancer Maternal Grandfather        was a smoker  . Cancer Maternal Grandfather        lung  . Heart disease Paternal Grandmother   . Heart disease Paternal Grandfather   . Colon cancer Neg Hx     SOCIAL HISTORY: Social History   Socioeconomic History  . Marital status: Divorced    Spouse name: Not on file  . Number of children: 1  . Years of education: Not on file  . Highest education level: Not on file  Occupational History  . Occupation: Restaurant manager, fast food: [employer]  Social Needs  . Financial resource strain: Not on file  . Food insecurity    Worry: Not on file    Inability: Not on file  . Transportation needs    Medical: Not on file    Non-medical: Not on file  Tobacco Use  . Smoking status: Never Smoker  .  Smokeless tobacco: Never Used  Substance and Sexual Activity  . Alcohol use: Yes    Frequency: Never    Comment: occ  . Drug use: No    Comment: former maryjuana use- quit in 1995  . Sexual activity: Yes  Lifestyle  . Physical activity    Days per week: Not on file    Minutes per session: Not on file  . Stress: Not on file  Relationships  . Social Herbalist on phone: Not on file    Gets together: Not on file    Attends religious service: Not on file    Active member of club or organization: Not on file    Attends meetings of clubs or organizations: Not on file    Relationship status: Not on file  . Intimate partner violence    Fear of current or ex partner: Not on file    Emotionally abused: Not on file    Physically abused: Not on file    Forced sexual activity: Not on file  Other Topics Concern  . Not on file  Social History Narrative   Marital status: divorced x 2; dating seriously x 3 years.  Boyfriend with HIV.       Children:  1 daughter (11); no grandchildren      Lives:  With boyfriend.        Employment: works at [place]: none      Alcohol: rarely; socially      Exercise: none; has treadmill and elliptical and bikes    REVIEW OF SYSTEMS: Constitutional: No fevers, chills, or sweats, no generalized fatigue, change in appetite Eyes: No visual changes, double vision, eye pain Ear, nose and throat: No hearing loss, ear pain, nasal congestion, sore throat Cardiovascular: No chest pain, palpitations Respiratory:  No shortness of breath  at rest or with exertion, wheezes GastrointestinaI: No nausea, vomiting, diarrhea, abdominal pain, fecal incontinence Genitourinary:  No dysuria, urinary retention or frequency Musculoskeletal:  No neck pain, back pain Integumentary: No rash, pruritus, skin lesions Neurological: as above Psychiatric: No depression, insomnia, anxiety Endocrine: No palpitations, fatigue, diaphoresis, mood swings, change in appetite,  change in weight, increased thirst Hematologic/Lymphatic:  No purpura, petechiae. Allergic/Immunologic: no itchy/runny eyes, nasal congestion, recent allergic reactions, rashes  PHYSICAL EXAM: Blood pressure 118/60, pulse 70, temperature 98.4 F (36.9 C), height 5' 7.5" (1.715 m), weight 157 lb (71.2 kg), last menstrual period 10/11/2010, SpO2 96 %. General: No acute distress.  Patient appears well-groomed.   Head:  Normocephalic/atraumatic Eyes:  fundi examined but not visualized Neck: supple, no paraspinal tenderness, full range of motion Back: No paraspinal tenderness Heart: regular rate and rhythm Lungs: Clear to auscultation bilaterally. Vascular: No carotid bruits. Neurological Exam: Mental status: alert and oriented to person, place, and time, recent and remote memory intact, fund of knowledge intact, attention and concentration intact, speech fluent and not dysarthric, language intact. Cranial nerves: CN I: not tested CN II: pupils equal, round and reactive to light, visual fields intact CN III, IV, VI:  full range of motion, no nystagmus, no ptosis CN V: facial sensation intact CN VII: upper and lower face symmetric CN VIII: hearing intact CN IX, X: gag intact, uvula midline CN XI: sternocleidomastoid and trapezius muscles intact CN XII: tongue midline Bulk & Tone: normal, no fasciculations. Motor:  5/5 throughout.  Very mild postural and kinetic tremor in hands. Sensation:  Pinprick and vibration sensation intact. Deep Tendon Reflexes:  2+ throughout, toes downgoing.   Finger to nose testing:  Without dysmetria.   Heel to shin:  Without dysmetria.   Gait:  Normal station and stride.  Able to turn and tandem walk. Romberg negative.  IMPRESSION: 1.  Essential tremor 2.  Chronic generalized pain including neck, back and legs.    PLAN: 1.  Increase primidone to 150mg  every morning (works better for her in the morning) 2.  To evaluate chronic pain, check ANA with reflex,  RF, Sed Rate, CRP, CK 3.  Follow up in 4 months.  Thank you for allowing me to take part in the care of this patient.  Metta Clines, DO  CC: Delia Chimes, MD

## 2019-03-04 ENCOUNTER — Encounter: Payer: Self-pay | Admitting: Neurology

## 2019-03-04 ENCOUNTER — Ambulatory Visit (INDEPENDENT_AMBULATORY_CARE_PROVIDER_SITE_OTHER): Payer: PRIVATE HEALTH INSURANCE | Admitting: Orthopaedic Surgery

## 2019-03-04 ENCOUNTER — Other Ambulatory Visit: Payer: Self-pay

## 2019-03-04 ENCOUNTER — Other Ambulatory Visit: Payer: PRIVATE HEALTH INSURANCE

## 2019-03-04 ENCOUNTER — Encounter: Payer: Self-pay | Admitting: Orthopaedic Surgery

## 2019-03-04 ENCOUNTER — Ambulatory Visit: Payer: PRIVATE HEALTH INSURANCE | Admitting: Neurology

## 2019-03-04 VITALS — BP 118/60 | HR 70 | Temp 98.4°F | Ht 67.5 in | Wt 157.0 lb

## 2019-03-04 DIAGNOSIS — G250 Essential tremor: Secondary | ICD-10-CM | POA: Diagnosis not present

## 2019-03-04 DIAGNOSIS — G8929 Other chronic pain: Secondary | ICD-10-CM | POA: Diagnosis not present

## 2019-03-04 DIAGNOSIS — R202 Paresthesia of skin: Secondary | ICD-10-CM | POA: Diagnosis not present

## 2019-03-04 DIAGNOSIS — R52 Pain, unspecified: Secondary | ICD-10-CM

## 2019-03-04 DIAGNOSIS — M1712 Unilateral primary osteoarthritis, left knee: Secondary | ICD-10-CM | POA: Diagnosis not present

## 2019-03-04 LAB — CK: Total CK: 49 U/L (ref 7–177)

## 2019-03-04 LAB — SEDIMENTATION RATE: Sed Rate: 5 mm/hr (ref 0–30)

## 2019-03-04 LAB — C-REACTIVE PROTEIN: CRP: <1 mg/dL (ref 0.5–20.0)

## 2019-03-04 MED ORDER — PRIMIDONE 50 MG PO TABS: 150.0000 mg | ORAL_TABLET | Freq: Every day | ORAL | 3 refills | Status: DC

## 2019-03-04 NOTE — Patient Instructions (Addendum)
1.  We increased primidone to 150mg  every morning 2.  To evaluate pain, will check ANA with reflex, rheumatoid factor, CK, sed rate, CRP 3.  Follow up in 4 months.

## 2019-03-04 NOTE — Progress Notes (Signed)
The patient is someone we have seen multiple times before over the years.  We have her plan for a knee replacement for her left knee in November of this year.  Her left knee pain is daily and is definitely affecting her mobility, her quality of life and her actives daily living.  We have performed arthroscopic surgery on her left knee.  She has had numerous steroid injections.  She is had hyaluronic acid injection on the left knee.  None of these have helped.  She still gets a lateral locking catching with her left knee.  It does swell on a regular basis and we have had to drain effusion from her knee.  An MRI confirmed areas of full-thickness cartilage loss along the weightbearing surface of her right knee.  Today she will to me to show her knee model explained in detail what the surgery involves.  We talked about her interoperative and postoperative course.  We described in detail what this involves.  She understands the postoperative therapy and pain control is the most important with these types of surgeries.  We had a long and thorough discussion about the risk and benefits of surgery.  We talked about operative and nonoperative treatment modalities.  She is tried and failed all forms of conservative treatment modalities but also surgical modality with an arthroscopic intervention.  This is now been of her the course of several years.  On examination of her left knee there is mild to moderate effusion today again.  She has pain throughout her arc of motion of the left knee.  Most of her pain is along the medial joint line.  There is significant pain with palpation along this area.  Her knee feels ligamentously stable.  At this point I do feel that her left knee arthritis is contributing now to left foot pain as well as right knee pain.  She does have a bunion deformity involving her great toe on the left side.  I do feel that appropriate shoe wear will help with that also given the fact that she is  pronating her foot that she may need inserts.  She is to look into that.  We will work on scheduling her for surgery in November.  We would then see her back in 2 weeks postoperative.  All question concerns were answered and addressed.

## 2019-03-05 LAB — RHEUMATOID FACTOR: Rheumatoid fact SerPl-aCnc: <14 IU/mL (ref <14)

## 2019-03-07 LAB — ANTINUCLEAR ANTIBODIES, IFA: ANA Titer 1: NEGATIVE

## 2019-03-10 ENCOUNTER — Telehealth: Payer: Self-pay | Admitting: *Deleted

## 2019-03-10 NOTE — Telephone Encounter (Signed)
Left message on patient's voice mail (ok per DPR) that her labs were normal.

## 2019-03-10 NOTE — Telephone Encounter (Signed)
-----   Message from Pieter Partridge, DO sent at 03/10/2019  7:01 AM EDT ----- Labs are normal.

## 2019-03-22 ENCOUNTER — Other Ambulatory Visit: Payer: Self-pay | Admitting: Family Medicine

## 2019-03-22 NOTE — Telephone Encounter (Signed)
Requested Prescriptions  Pending Prescriptions Disp Refills  . Travoprost, BAK Free, (TRAVATAN) 0.004 % SOLN ophthalmic solution [Pharmacy Med Name: TRAVOPROST 0.004% EYE DROP] 5 mL 3    Sig: INSTILL 1 DROP INTO BOTH EYES AT BEDTIME     Ophthalmology:  Glaucoma Passed - 03/22/2019  1:33 PM      Passed - Valid encounter within last 12 months    Recent Outpatient Visits          2 months ago Tremor   [facility] at [facility], [place], [place]   8 months ago Constipation, unspecified constipation type   [facility] at [facility], Lilia Argue, MD   8 months ago Fever, unspecified   [facility] at [place], [place]   8 months ago Essential hypertension   Primary Care at [place], [place]   1 year ago Gastroesophageal reflux disease without esophagitis   Primary Care at [facility], Arlie Solomons, MD      Future Appointments            In 1 month Ninfa Linden, Lind Guest, MD [place]   In 4 months Forrest Moron, MD Primary Care at [place], [facility]

## 2019-03-31 ENCOUNTER — Encounter: Payer: Self-pay | Admitting: Family Medicine

## 2019-04-18 ENCOUNTER — Other Ambulatory Visit: Payer: Self-pay

## 2019-04-23 ENCOUNTER — Other Ambulatory Visit: Payer: Self-pay | Admitting: Physician Assistant

## 2019-04-24 NOTE — Pre-Procedure Instructions (Signed)
[id number], [place] - [address] [place] [address] Phone: [phone] Fax: [phone]     Your procedure is scheduled on Tuesday November 10th.  Report to [facility] Main Entrance "A" at 10:00 A.M., and check in at the Admitting office.  Call this number if you have problems the morning of surgery:  [phone]  Call [phone] if you have any questions prior to your surgery date Monday-Friday 8am-4pm    Remember:  Do not eat after midnight the night before your surgery  You may drink clear liquids until 9:00 the morning of your surgery.   Clear liquids allowed are: Water, Non-Citrus Juices (without pulp), Carbonated Beverages, Clear Tea, Black Coffee Only, and Gatorade. Your surgeon has prescribed a pre-surgery Ensure carbohydrate drink for you. Please finish by 9:00 the morning of surgery. Patient Instructions  . The night before surgery:  o No food after midnight. ONLY clear liquids after midnight  . The day of surgery (if you do NOT have diabetes):  o Drink ONE (1) Pre-Surgery Clear Ensure as directed.   o This drink was given to you during your hospital  pre-op appointment visit. o The pre-op nurse will instruct you on the time to drink the  Pre-Surgery Ensure depending on your surgery time. o Finish the drink at the designated time by the pre-op nurse.  o Nothing else to drink after completing the  Pre-Surgery Clear Ensure.         If you have questions, please contact your surgeon's office.     Take these medicines the morning of surgery with A SIP OF WATER  primidone (MYSOLINE) acetaminophen (TYLENOL)-if needed ondansetron (ZOFRAN)-if needed   7 days prior to surgery STOP taking any Aspirin (unless otherwise instructed by your surgeon), Aleve, Naproxen, Ibuprofen, Motrin, Advil, Goody's, BC's, all herbal medications, fish oil, and all vitamins.    The Morning of Surgery  Do not wear jewelry, make-up or nail  polish.  Do not wear lotions, powders, or perfumes/colognes, or deodorant  Do not shave 48 hours prior to surgery.  Men may shave face and neck.  Do not bring valuables to the hospital.  [facility] is not responsible for any belongings or valuables.  If you are a smoker, DO NOT Smoke 24 hours prior to surgery IF you wear a CPAP at night please bring your mask, tubing, and machine the morning of surgery   Remember that you must have someone to transport you home after your surgery, and remain with you for 24 hours if you are discharged the same day.   Contacts, glasses, hearing aids, dentures or bridgework may not be worn into surgery.    Leave your suitcase in the car.  After surgery it may be brought to your room.  For patients admitted to the hospital, discharge time will be determined by your treatment team.  Patients discharged the day of surgery will not be allowed to drive home.    Special instructions:   [facility]- Preparing For Surgery  Before surgery, you can play an important role. Because skin is not sterile, your skin needs to be as free of germs as possible. You can reduce the number of germs on your skin by washing with CHG (chlorahexidine gluconate) Soap before surgery.  CHG is an antiseptic cleaner which kills germs and bonds with the skin to continue killing germs even after washing.    Oral Hygiene is also important to reduce your risk of  infection.  Remember - BRUSH YOUR TEETH THE MORNING OF SURGERY WITH YOUR REGULAR TOOTHPASTE  Please do not use if you have an allergy to CHG or antibacterial soaps. If your skin becomes reddened/irritated stop using the CHG.  Do not shave (including legs and underarms) for at least 48 hours prior to first CHG shower. It is OK to shave your face.  Please follow these instructions carefully.   1. Shower the NIGHT BEFORE SURGERY and the MORNING OF SURGERY with CHG Soap.   2. If you chose to wash your hair, wash your hair first  as usual with your normal shampoo.  3. After you shampoo, rinse your hair and body thoroughly to remove the shampoo.  4. Use CHG as you would any other liquid soap. You can apply CHG directly to the skin and wash gently with a scrungie or a clean washcloth.   5. Apply the CHG Soap to your body ONLY FROM THE NECK DOWN.  Do not use on open wounds or open sores. Avoid contact with your eyes, ears, mouth and genitals (private parts). Wash Face and genitals (private parts)  with your normal soap.   6. Wash thoroughly, paying special attention to the area where your surgery will be performed.  7. Thoroughly rinse your body with warm water from the neck down.  8. DO NOT shower/wash with your normal soap after using and rinsing off the CHG Soap.  9. Pat yourself dry with a CLEAN TOWEL.  10. Wear CLEAN PAJAMAS to bed the night before surgery, wear comfortable clothes the morning of surgery  11. Place CLEAN SHEETS on your bed the night of your first shower and DO NOT SLEEP WITH PETS.    Day of Surgery:  Do not apply any deodorants/lotions. Please shower the morning of surgery with the CHG soap  Please wear clean clothes to the hospital/surgery center.   Remember to brush your teeth WITH YOUR REGULAR TOOTHPASTE.   Please read over the following fact sheets that you were given.

## 2019-04-25 ENCOUNTER — Other Ambulatory Visit (HOSPITAL_COMMUNITY)
Admission: RE | Admit: 2019-04-25 | Discharge: 2019-04-25 | Disposition: A | Discharge status: Home / Self Care | Payer: PRIVATE HEALTH INSURANCE | Source: Ambulatory Visit | Attending: Orthopaedic Surgery | Admitting: Orthopaedic Surgery

## 2019-04-25 ENCOUNTER — Encounter (HOSPITAL_COMMUNITY)
Admission: RE | Admit: 2019-04-25 | Discharge: 2019-04-25 | Disposition: A | Discharge status: Home / Self Care | Payer: PRIVATE HEALTH INSURANCE | Source: Ambulatory Visit | Attending: Orthopaedic Surgery | Admitting: Orthopaedic Surgery

## 2019-04-25 ENCOUNTER — Other Ambulatory Visit: Payer: Self-pay

## 2019-04-25 ENCOUNTER — Encounter (HOSPITAL_COMMUNITY): Payer: Self-pay

## 2019-04-25 DIAGNOSIS — Z20828 Contact with and (suspected) exposure to other viral communicable diseases: Secondary | ICD-10-CM | POA: Insufficient documentation

## 2019-04-25 DIAGNOSIS — I10 Essential (primary) hypertension: Secondary | ICD-10-CM | POA: Diagnosis not present

## 2019-04-25 DIAGNOSIS — Z01818 Encounter for other preprocedural examination: Secondary | ICD-10-CM | POA: Diagnosis present

## 2019-04-25 HISTORY — DX: Personal history of urinary calculi: Z87.442

## 2019-04-25 HISTORY — DX: Anemia, unspecified: D64.9

## 2019-04-25 LAB — CBC
HCT: 38.7 % (ref 36.0–46.0)
Hemoglobin: 12.8 g/dL (ref 12.0–15.0)
MCH: 29.7 pg (ref 26.0–34.0)
MCHC: 33.1 g/dL (ref 30.0–36.0)
MCV: 89.8 fL (ref 80.0–100.0)
Platelets: 318 10*3/uL (ref 150–400)
RBC: 4.31 MIL/uL (ref 3.87–5.11)
RDW: 12.1 % (ref 11.5–15.5)
WBC: 4.9 10*3/uL (ref 4.0–10.5)
nRBC: 0 % (ref 0.0–0.2)

## 2019-04-25 LAB — BASIC METABOLIC PANEL
Anion gap: 9 (ref 5–15)
BUN: 17 mg/dL (ref 6–20)
CO2: 26 mmol/L (ref 22–32)
Calcium: 9.6 mg/dL (ref 8.9–10.3)
Chloride: 105 mmol/L (ref 98–111)
Creatinine, Ser: 0.71 mg/dL (ref 0.44–1.00)
GFR calc Af Amer: >60 mL/min (ref >60)
GFR calc non Af Amer: >60 mL/min (ref >60)
Glucose, Bld: 81 mg/dL (ref 70–99)
Potassium: 4.3 mmol/L (ref 3.5–5.1)
Sodium: 140 mmol/L (ref 135–145)

## 2019-04-25 LAB — SURGICAL PCR SCREEN
MRSA, PCR: NEGATIVE
Staphylococcus aureus: NEGATIVE

## 2019-04-25 NOTE — Progress Notes (Signed)
PCP - Dr. Delia Chimes Cardiologist - N/A  PPM/ICD - N/A Device Orders -  Rep Notified -   Chest x-ray - N/A EKG - 04/25/19 Stress Test - 06/08/11 ECHO - 06/08/11 Cardiac Cath - denies  Sleep Study - denies CPAP -   Fasting Blood Sugar - N/a Checks Blood Sugar _____ times a day  Blood Thinner Instructions:N/A Aspirin Instructions: N/A  ERAS Protcol - yes PRE-SURGERY Ensure or G2- Pre-Surgery Ensure  COVID TEST- today  Visitation policy reviewed with pt and she voiced understanding   Anesthesia review: No  Patient denies shortness of breath, fever, cough and chest pain at PAT appointment   All instructions explained to the patient, with a verbal understanding of the material. Patient agrees to go over the instructions while at home for a better understanding. Patient also instructed to self quarantine after being tested for COVID-19. The opportunity to ask questions was provided.

## 2019-04-25 NOTE — Pre-Procedure Instructions (Signed)
Almadelia Umana  04/25/2019    Your procedure is scheduled on Tuesday, 04/29/19 at 12 Noon.   Report to [facility] Entrance "A" Admitting Office at 10:00 AM.   Call this number if you have problems the morning of surgery: [phone]   Questions prior to day of surgery, please call [phone] between 8 & 4 PM.    Remember:  Do not eat food after midnight Monday, 04/28/19.  You may drink clear liquids until 9:00 AM .  Clear liquids allowed are: Water, Juice (non-citric and without pulp), Carbonated beverages, Clear Tea, Black Coffee only, Plain Jell-O only, Gatorade and Plain Popsicles only   Drink Pre-Surgery Ensure by 9:00 AM (do not sip).    Take these medicines the morning of surgery with A SIP OF WATER: Primidone (Mysoline), Tylenol - if needed, Ondansetron (Zofran) - if needed  Stop NSAIDS (Ibuprofen, Celebrex, Aleve, etc) and Multivitamins as of today. Do not use Aspirin products (BC Powders, Goody's, etc), Herbal medications or Fish Oil prior to surgery.    Do not wear jewelry, make-up or nail polish.  Do not wear lotions, powders, perfumes or deodorant.  Do not shave 48 hours prior to surgery.   Do not bring valuables to the hospital.  [facility] is not responsible for any belongings or valuables.  Contacts, dentures or bridgework may not be worn into surgery.  Leave your suitcase in the car.  After surgery it may be brought to your room.  For patients admitted to the hospital, discharge time will be determined by your treatment team.  [facility] - Preparing for Surgery  Before surgery, you can play an important role.  Because skin is not sterile, your skin needs to be as free of germs as possible.  You can reduce the number of germs on you skin by washing with CHG (chlorahexidine gluconate) soap before surgery.  CHG is an antiseptic cleaner which kills germs and bonds with the skin to continue killing germs even after washing.  Oral Hygiene is also  important in reducing the risk of infection.  Remember to brush your teeth with your regular toothpaste the morning of surgery.  Please DO NOT use if you have an allergy to CHG or antibacterial soaps.  If your skin becomes reddened/irritated stop using the CHG and inform your nurse when you arrive at Short Stay.  Do not shave (including legs and underarms) for at least 48 hours prior to the first CHG shower.  You may shave your face.  Please follow these instructions carefully:   1.  Shower with CHG Soap the night before surgery and the morning of Surgery.  2.  If you choose to wash your hair, wash your hair first as usual with your normal shampoo.  3.  After you shampoo, rinse your hair and body thoroughly to remove the shampoo. 4.  Use CHG as you would any other liquid soap.  You can apply chg directly to the skin and wash gently with a      scrungie or washcloth.           5.  Apply the CHG Soap to your body ONLY FROM THE NECK DOWN.   Do not use on open wounds or open sores. Avoid contact with your eyes, ears, mouth and genitals (private parts).  Wash genitals (private parts) with your normal soap - do this prior to using CHG soap.  6.  Wash thoroughly, paying special attention to the area where your  surgery will be performed.  7.  Thoroughly rinse your body with warm water from the neck down.  8.  DO NOT shower/wash with your normal soap after using and rinsing off the CHG Soap.  9.  Pat yourself dry with a clean towel.            10.  Wear clean pajamas.            11.  Place clean sheets on your bed the night of your first shower and do not sleep with pets.  Day of Surgery  Shower as above. Do not apply any lotions/deodorants the morning of surgery.   Please wear clean clothes to the hospital. Remember to brush your teeth with toothpaste.  Please read over the fact sheets that you were given.

## 2019-04-26 LAB — NOVEL CORONAVIRUS, NAA (HOSP ORDER, SEND-OUT TO REF LAB; TAT 18-24 HRS): SARS-CoV-2, NAA: NOT DETECTED

## 2019-04-29 ENCOUNTER — Encounter (HOSPITAL_COMMUNITY)
Admission: RE | Disposition: A | Discharge status: Home / Self Care | Payer: Self-pay | Source: Home / Self Care | Attending: Orthopaedic Surgery

## 2019-04-29 ENCOUNTER — Inpatient Hospital Stay (HOSPITAL_COMMUNITY)
Admission: RE | Admit: 2019-04-29 | Discharge: 2019-05-02 | DRG: 470 | Disposition: A | Discharge status: Home / Self Care | Payer: PRIVATE HEALTH INSURANCE | Attending: Orthopaedic Surgery | Admitting: Orthopaedic Surgery

## 2019-04-29 ENCOUNTER — Inpatient Hospital Stay (HOSPITAL_COMMUNITY): Payer: PRIVATE HEALTH INSURANCE | Admitting: Certified Registered"

## 2019-04-29 ENCOUNTER — Inpatient Hospital Stay (HOSPITAL_COMMUNITY): Payer: PRIVATE HEALTH INSURANCE

## 2019-04-29 ENCOUNTER — Encounter (HOSPITAL_COMMUNITY): Payer: Self-pay | Admitting: *Deleted

## 2019-04-29 ENCOUNTER — Inpatient Hospital Stay (HOSPITAL_COMMUNITY): Payer: PRIVATE HEALTH INSURANCE | Admitting: Vascular Surgery

## 2019-04-29 ENCOUNTER — Other Ambulatory Visit: Payer: Self-pay

## 2019-04-29 DIAGNOSIS — D62 Acute posthemorrhagic anemia: Secondary | ICD-10-CM | POA: Diagnosis not present

## 2019-04-29 DIAGNOSIS — I10 Essential (primary) hypertension: Secondary | ICD-10-CM | POA: Diagnosis present

## 2019-04-29 DIAGNOSIS — Z9842 Cataract extraction status, left eye: Secondary | ICD-10-CM | POA: Diagnosis not present

## 2019-04-29 DIAGNOSIS — F411 Generalized anxiety disorder: Secondary | ICD-10-CM | POA: Diagnosis present

## 2019-04-29 DIAGNOSIS — Z8249 Family history of ischemic heart disease and other diseases of the circulatory system: Secondary | ICD-10-CM

## 2019-04-29 DIAGNOSIS — M1711 Unilateral primary osteoarthritis, right knee: Secondary | ICD-10-CM

## 2019-04-29 DIAGNOSIS — Z825 Family history of asthma and other chronic lower respiratory diseases: Secondary | ICD-10-CM | POA: Diagnosis not present

## 2019-04-29 DIAGNOSIS — Z96652 Presence of left artificial knee joint: Secondary | ICD-10-CM

## 2019-04-29 DIAGNOSIS — M7541 Impingement syndrome of right shoulder: Secondary | ICD-10-CM | POA: Diagnosis present

## 2019-04-29 DIAGNOSIS — Z801 Family history of malignant neoplasm of trachea, bronchus and lung: Secondary | ICD-10-CM

## 2019-04-29 DIAGNOSIS — M1712 Unilateral primary osteoarthritis, left knee: Secondary | ICD-10-CM | POA: Diagnosis present

## 2019-04-29 DIAGNOSIS — Z90710 Acquired absence of both cervix and uterus: Secondary | ICD-10-CM

## 2019-04-29 DIAGNOSIS — Z87442 Personal history of urinary calculi: Secondary | ICD-10-CM | POA: Diagnosis not present

## 2019-04-29 DIAGNOSIS — M7061 Trochanteric bursitis, right hip: Secondary | ICD-10-CM | POA: Diagnosis present

## 2019-04-29 DIAGNOSIS — Z20828 Contact with and (suspected) exposure to other viral communicable diseases: Secondary | ICD-10-CM | POA: Diagnosis present

## 2019-04-29 DIAGNOSIS — M25762 Osteophyte, left knee: Secondary | ICD-10-CM | POA: Diagnosis present

## 2019-04-29 DIAGNOSIS — Z9841 Cataract extraction status, right eye: Secondary | ICD-10-CM

## 2019-04-29 DIAGNOSIS — M542 Cervicalgia: Secondary | ICD-10-CM | POA: Diagnosis present

## 2019-04-29 DIAGNOSIS — Z833 Family history of diabetes mellitus: Secondary | ICD-10-CM | POA: Diagnosis not present

## 2019-04-29 HISTORY — PX: TOTAL KNEE ARTHROPLASTY: SHX125

## 2019-04-29 SURGERY — ARTHROPLASTY, KNEE, TOTAL
Anesthesia: Monitor Anesthesia Care | Site: Knee | Laterality: Left

## 2019-04-29 MED ORDER — ACYCLOVIR 400 MG PO TABS
400.0000 mg | ORAL_TABLET | Freq: Every day | ORAL | Status: DC
  Administered 2019-04-29 – 2019-05-01 (×3): 400 mg via ORAL
  Filled 2019-04-29 (×4): qty 1

## 2019-04-29 MED ORDER — KETOROLAC TROMETHAMINE 15 MG/ML IJ SOLN
7.5000 mg | Freq: Four times a day (QID) | INTRAMUSCULAR | Status: DC
  Administered 2019-04-29 – 2019-05-02 (×9): 7.5 mg via INTRAVENOUS
  Filled 2019-04-29 (×9): qty 1

## 2019-04-29 MED ORDER — PROPOFOL 10 MG/ML IV BOLUS
INTRAVENOUS | Status: AC
  Filled 2019-04-29: qty 20

## 2019-04-29 MED ORDER — ONDANSETRON HCL 4 MG PO TABS
4.0000 mg | ORAL_TABLET | Freq: Four times a day (QID) | ORAL | Status: DC | PRN
  Administered 2019-05-01 – 2019-05-02 (×3): 4 mg via ORAL
  Filled 2019-04-29 (×4): qty 1

## 2019-04-29 MED ORDER — CLONAZEPAM 0.5 MG PO TABS
0.5000 mg | ORAL_TABLET | Freq: Every day | ORAL | Status: DC
  Administered 2019-04-29 – 2019-05-01 (×3): 0.5 mg via ORAL
  Filled 2019-04-29 (×3): qty 1

## 2019-04-29 MED ORDER — METOCLOPRAMIDE HCL 5 MG PO TABS: 5.0000 mg | ORAL_TABLET | Freq: Three times a day (TID) | ORAL | Status: DC | PRN

## 2019-04-29 MED ORDER — VITAMIN D 25 MCG (1000 UNIT) PO TABS
2000.0000 [IU] | ORAL_TABLET | Freq: Every day | ORAL | Status: DC
  Filled 2019-04-29 (×4): qty 2

## 2019-04-29 MED ORDER — CLINDAMYCIN PHOSPHATE 900 MG/50ML IV SOLN
900.0000 mg | INTRAVENOUS | Status: AC
  Administered 2019-04-29: 900 mg via INTRAVENOUS
  Filled 2019-04-29: qty 50

## 2019-04-29 MED ORDER — ACETAMINOPHEN 325 MG PO TABS
325.0000 mg | ORAL_TABLET | Freq: Four times a day (QID) | ORAL | Status: DC | PRN
  Administered 2019-04-30 – 2019-05-02 (×2): 650 mg via ORAL
  Filled 2019-04-29 (×2): qty 2

## 2019-04-29 MED ORDER — ZINC GLUCONATE 50 MG PO TABS: 50.0000 mg | ORAL_TABLET | Freq: Every day | ORAL | Status: DC

## 2019-04-29 MED ORDER — TRANEXAMIC ACID-NACL 1000-0.7 MG/100ML-% IV SOLN
INTRAVENOUS | Status: AC
  Filled 2019-04-29: qty 100

## 2019-04-29 MED ORDER — MIDAZOLAM HCL 5 MG/5ML IJ SOLN
INTRAMUSCULAR | Status: DC | PRN
  Administered 2019-04-29: 2 mg via INTRAVENOUS

## 2019-04-29 MED ORDER — ACETAMINOPHEN 10 MG/ML IV SOLN
INTRAVENOUS | Status: AC
  Filled 2019-04-29: qty 100

## 2019-04-29 MED ORDER — MAGNESIUM OXIDE 400 (241.3 MG) MG PO TABS
400.0000 mg | ORAL_TABLET | Freq: Every day | ORAL | Status: DC
  Filled 2019-04-29 (×2): qty 1

## 2019-04-29 MED ORDER — OXYCODONE HCL 5 MG PO TABS: 10.0000 mg | ORAL_TABLET | ORAL | Status: DC | PRN

## 2019-04-29 MED ORDER — VITAMIN C 500 MG PO TABS
1000.0000 mg | ORAL_TABLET | Freq: Every day | ORAL | Status: DC
  Filled 2019-04-29 (×2): qty 2

## 2019-04-29 MED ORDER — ROPIVACAINE HCL 7.5 MG/ML IJ SOLN
INTRAMUSCULAR | Status: DC | PRN
  Administered 2019-04-29: 20 mL via PERINEURAL

## 2019-04-29 MED ORDER — PANTOPRAZOLE SODIUM 40 MG PO TBEC
40.0000 mg | DELAYED_RELEASE_TABLET | Freq: Every day | ORAL | Status: DC
  Administered 2019-04-29 – 2019-05-02 (×3): 40 mg via ORAL
  Filled 2019-04-29 (×6): qty 1

## 2019-04-29 MED ORDER — FENTANYL CITRATE (PF) 100 MCG/2ML IJ SOLN
50.0000 ug | Freq: Once | INTRAMUSCULAR | Status: AC
  Administered 2019-04-29: 12:00:00 50 ug via INTRAVENOUS

## 2019-04-29 MED ORDER — METHOCARBAMOL 500 MG PO TABS
ORAL_TABLET | ORAL | Status: AC
  Filled 2019-04-29: qty 1

## 2019-04-29 MED ORDER — POVIDONE-IODINE 10 % EX SWAB: 2.0000 "application " | Freq: Once | CUTANEOUS | Status: DC

## 2019-04-29 MED ORDER — ACETAMINOPHEN 500 MG PO TABS: 1000.0000 mg | ORAL_TABLET | Freq: Once | ORAL | Status: DC | PRN

## 2019-04-29 MED ORDER — PRIMIDONE 50 MG PO TABS
150.0000 mg | ORAL_TABLET | Freq: Every day | ORAL | Status: DC
  Administered 2019-04-30 – 2019-05-02 (×3): 150 mg via ORAL
  Filled 2019-04-29 (×3): qty 3

## 2019-04-29 MED ORDER — FENTANYL CITRATE (PF) 100 MCG/2ML IJ SOLN: 25.0000 ug | INTRAMUSCULAR | Status: DC | PRN

## 2019-04-29 MED ORDER — PHENOL 1.4 % MT LIQD: 1.0000 | OROMUCOSAL | Status: DC | PRN

## 2019-04-29 MED ORDER — CLINDAMYCIN PHOSPHATE 600 MG/50ML IV SOLN
600.0000 mg | Freq: Four times a day (QID) | INTRAVENOUS | Status: AC
  Administered 2019-04-29 – 2019-04-30 (×2): 600 mg via INTRAVENOUS
  Filled 2019-04-29 (×2): qty 50

## 2019-04-29 MED ORDER — ACETAMINOPHEN 10 MG/ML IV SOLN
1000.0000 mg | Freq: Once | INTRAVENOUS | Status: DC | PRN
  Administered 2019-04-29: 1000 mg via INTRAVENOUS

## 2019-04-29 MED ORDER — EPHEDRINE SULFATE-NACL 50-0.9 MG/10ML-% IV SOSY
PREFILLED_SYRINGE | INTRAVENOUS | Status: DC | PRN
  Administered 2019-04-29: 10 mg via INTRAVENOUS
  Administered 2019-04-29 (×2): 5 mg via INTRAVENOUS

## 2019-04-29 MED ORDER — ALUM & MAG HYDROXIDE-SIMETH 200-200-20 MG/5ML PO SUSP: 30.0000 mL | ORAL | Status: DC | PRN

## 2019-04-29 MED ORDER — DIPHENHYDRAMINE HCL 12.5 MG/5ML PO ELIX: 12.5000 mg | ORAL_SOLUTION | ORAL | Status: DC | PRN

## 2019-04-29 MED ORDER — METOCLOPRAMIDE HCL 5 MG/ML IJ SOLN
5.0000 mg | Freq: Three times a day (TID) | INTRAMUSCULAR | Status: DC | PRN
  Administered 2019-04-30: 5 mg via INTRAVENOUS
  Filled 2019-04-29: qty 2

## 2019-04-29 MED ORDER — OXYCODONE HCL 5 MG PO TABS
5.0000 mg | ORAL_TABLET | ORAL | Status: DC | PRN
  Administered 2019-04-29 – 2019-05-02 (×11): 5 mg via ORAL
  Filled 2019-04-29 (×10): qty 1

## 2019-04-29 MED ORDER — DOCUSATE SODIUM 100 MG PO CAPS
100.0000 mg | ORAL_CAPSULE | Freq: Two times a day (BID) | ORAL | Status: DC
  Filled 2019-04-29 (×4): qty 1

## 2019-04-29 MED ORDER — FENTANYL CITRATE (PF) 100 MCG/2ML IJ SOLN
INTRAMUSCULAR | Status: AC
  Administered 2019-04-29: 50 ug via INTRAVENOUS
  Filled 2019-04-29: qty 2

## 2019-04-29 MED ORDER — ADULT MULTIVITAMIN W/MINERALS CH
1.0000 | ORAL_TABLET | Freq: Every day | ORAL | Status: DC
  Filled 2019-04-29: qty 1

## 2019-04-29 MED ORDER — POLYETHYLENE GLYCOL 3350 17 G PO PACK: 17.0000 g | PACK | Freq: Every day | ORAL | Status: DC | PRN

## 2019-04-29 MED ORDER — NEBIVOLOL HCL 5 MG PO TABS
5.0000 mg | ORAL_TABLET | Freq: Every day | ORAL | Status: DC
  Administered 2019-04-29 – 2019-05-01 (×3): 5 mg via ORAL
  Filled 2019-04-29 (×4): qty 1

## 2019-04-29 MED ORDER — SODIUM CHLORIDE 0.9 % IR SOLN
Status: DC | PRN
  Administered 2019-04-29: 3000 mL

## 2019-04-29 MED ORDER — ACETAMINOPHEN 160 MG/5ML PO SOLN: 1000.0000 mg | Freq: Once | ORAL | Status: DC | PRN

## 2019-04-29 MED ORDER — SODIUM CHLORIDE 0.9 % IV SOLN
INTRAVENOUS | Status: DC
  Administered 2019-04-29: 17:00:00 via INTRAVENOUS

## 2019-04-29 MED ORDER — 0.9 % SODIUM CHLORIDE (POUR BTL) OPTIME
TOPICAL | Status: DC | PRN
  Administered 2019-04-29: 1000 mL

## 2019-04-29 MED ORDER — FENTANYL CITRATE (PF) 100 MCG/2ML IJ SOLN
INTRAMUSCULAR | Status: DC | PRN
  Administered 2019-04-29 (×2): 50 ug via INTRAVENOUS

## 2019-04-29 MED ORDER — VITAMIN B-12 1000 MCG PO TABS
1000.0000 ug | ORAL_TABLET | Freq: Every day | ORAL | Status: DC
  Filled 2019-04-29 (×2): qty 1

## 2019-04-29 MED ORDER — METHOCARBAMOL 1000 MG/10ML IJ SOLN
500.0000 mg | Freq: Four times a day (QID) | INTRAVENOUS | Status: DC | PRN
  Filled 2019-04-29: qty 5

## 2019-04-29 MED ORDER — CHLORHEXIDINE GLUCONATE 4 % EX LIQD: 60.0000 mL | Freq: Once | CUTANEOUS | Status: DC

## 2019-04-29 MED ORDER — LIDOCAINE 2% (20 MG/ML) 5 ML SYRINGE
INTRAMUSCULAR | Status: AC
  Filled 2019-04-29: qty 5

## 2019-04-29 MED ORDER — METHOCARBAMOL 500 MG PO TABS
500.0000 mg | ORAL_TABLET | Freq: Four times a day (QID) | ORAL | Status: DC | PRN
  Administered 2019-04-29 – 2019-05-02 (×4): 500 mg via ORAL
  Filled 2019-04-29 (×4): qty 1

## 2019-04-29 MED ORDER — TRANEXAMIC ACID-NACL 1000-0.7 MG/100ML-% IV SOLN
1000.0000 mg | INTRAVENOUS | Status: AC
  Administered 2019-04-29: 1000 mg via INTRAVENOUS
  Filled 2019-04-29: qty 100

## 2019-04-29 MED ORDER — PROPOFOL 500 MG/50ML IV EMUL
INTRAVENOUS | Status: DC | PRN
  Administered 2019-04-29: 50 ug/kg/min via INTRAVENOUS

## 2019-04-29 MED ORDER — ASPIRIN 81 MG PO CHEW
81.0000 mg | CHEWABLE_TABLET | Freq: Two times a day (BID) | ORAL | Status: DC
  Administered 2019-04-29 – 2019-05-02 (×6): 81 mg via ORAL
  Filled 2019-04-29 (×6): qty 1

## 2019-04-29 MED ORDER — LACTATED RINGERS IV SOLN
INTRAVENOUS | Status: DC
  Administered 2019-04-29 (×2): via INTRAVENOUS

## 2019-04-29 MED ORDER — BISACODYL 10 MG RE SUPP: 10.0000 mg | Freq: Every day | RECTAL | Status: DC | PRN

## 2019-04-29 MED ORDER — MIDAZOLAM HCL 2 MG/2ML IJ SOLN
1.0000 mg | Freq: Once | INTRAMUSCULAR | Status: AC
  Administered 2019-04-29: 12:00:00 1 mg via INTRAVENOUS

## 2019-04-29 MED ORDER — BUPIVACAINE IN DEXTROSE 0.75-8.25 % IT SOLN
INTRATHECAL | Status: DC | PRN
  Administered 2019-04-29: 2 mL via INTRATHECAL

## 2019-04-29 MED ORDER — FENTANYL CITRATE (PF) 250 MCG/5ML IJ SOLN
INTRAMUSCULAR | Status: AC
  Filled 2019-04-29: qty 5

## 2019-04-29 MED ORDER — ONDANSETRON HCL 4 MG/2ML IJ SOLN
4.0000 mg | Freq: Four times a day (QID) | INTRAMUSCULAR | Status: DC | PRN
  Administered 2019-04-29 – 2019-05-01 (×5): 4 mg via INTRAVENOUS
  Filled 2019-04-29 (×5): qty 2

## 2019-04-29 MED ORDER — OXYCODONE HCL 5 MG PO TABS
ORAL_TABLET | ORAL | Status: AC
  Filled 2019-04-29: qty 1

## 2019-04-29 MED ORDER — MIDAZOLAM HCL 2 MG/2ML IJ SOLN
INTRAMUSCULAR | Status: AC
  Administered 2019-04-29: 1 mg via INTRAVENOUS
  Filled 2019-04-29: qty 2

## 2019-04-29 MED ORDER — MIDAZOLAM HCL 2 MG/2ML IJ SOLN
INTRAMUSCULAR | Status: AC
  Filled 2019-04-29: qty 2

## 2019-04-29 MED ORDER — PROPOFOL 1000 MG/100ML IV EMUL
INTRAVENOUS | Status: AC
  Filled 2019-04-29: qty 100

## 2019-04-29 MED ORDER — HYDROMORPHONE HCL 1 MG/ML IJ SOLN
0.5000 mg | INTRAMUSCULAR | Status: DC | PRN
  Administered 2019-04-29 – 2019-05-01 (×3): 1 mg via INTRAVENOUS
  Filled 2019-04-29 (×3): qty 1

## 2019-04-29 MED ORDER — MENTHOL 3 MG MT LOZG: 1.0000 | LOZENGE | OROMUCOSAL | Status: DC | PRN

## 2019-04-29 SURGICAL SUPPLY — 67 items
BANDAGE ESMARK 6X9 LF (GAUZE/BANDAGES/DRESSINGS) ×1 IMPLANT
BASEPLATE TIBIAL SZ2 TRI (Joint) ×1 IMPLANT
BEARING TIBIAL TRIATHLON SZ 2 (Knees) ×3 IMPLANT
BLADE SAG 18X100X1.27 (BLADE) ×3 IMPLANT
BNDG ELASTIC 6X10 VLCR STRL LF (GAUZE/BANDAGES/DRESSINGS) ×6 IMPLANT
BNDG ELASTIC 6X5.8 VLCR STR LF (GAUZE/BANDAGES/DRESSINGS) ×6 IMPLANT
BNDG ESMARK 6X9 LF (GAUZE/BANDAGES/DRESSINGS) ×3
BOWL SMART MIX CTS (DISPOSABLE) ×3 IMPLANT
CLOSURE WOUND 1/2 X4 (GAUZE/BANDAGES/DRESSINGS)
COVER SURGICAL LIGHT HANDLE (MISCELLANEOUS) ×3 IMPLANT
COVER WAND RF STERILE (DRAPES) ×3 IMPLANT
CUFF TOURN SGL QUICK 34 (TOURNIQUET CUFF) ×3
CUFF TOURN SGL QUICK 42 (TOURNIQUET CUFF) IMPLANT
CUFF TRNQT CYL 34X4.125X (TOURNIQUET CUFF) ×1 IMPLANT
DRAPE EXTREMITY T 121X128X90 (DISPOSABLE) ×3 IMPLANT
DRAPE HALF SHEET 40X57 (DRAPES) ×3 IMPLANT
DRAPE U-SHAPE 47X51 STRL (DRAPES) ×3 IMPLANT
DRSG PAD ABDOMINAL 8X10 ST (GAUZE/BANDAGES/DRESSINGS) ×3 IMPLANT
DRSG XEROFORM 1X8 (GAUZE/BANDAGES/DRESSINGS) ×3 IMPLANT
DURAPREP 26ML APPLICATOR (WOUND CARE) ×3 IMPLANT
ELECT CAUTERY BLADE 6.4 (BLADE) ×3 IMPLANT
ELECT REM PT RETURN 9FT ADLT (ELECTROSURGICAL) ×3
ELECTRODE REM PT RTRN 9FT ADLT (ELECTROSURGICAL) ×1 IMPLANT
FACESHIELD WRAPAROUND (MASK) ×6 IMPLANT
FEMORAL POST STABILIZED SZ2 LT (Joint) ×3 IMPLANT
GAUZE SPONGE 4X4 12PLY STRL (GAUZE/BANDAGES/DRESSINGS) ×3 IMPLANT
GAUZE XEROFORM 1X8 LF (GAUZE/BANDAGES/DRESSINGS) ×3 IMPLANT
GLOVE BIOGEL PI IND STRL 8 (GLOVE) ×2 IMPLANT
GLOVE BIOGEL PI INDICATOR 8 (GLOVE) ×4
GLOVE ORTHO TXT STRL SZ7.5 (GLOVE) ×3 IMPLANT
GLOVE SURG ORTHO 8.0 STRL STRW (GLOVE) ×3 IMPLANT
GOWN STRL REUS W/ TWL LRG LVL3 (GOWN DISPOSABLE) IMPLANT
GOWN STRL REUS W/ TWL XL LVL3 (GOWN DISPOSABLE) ×2 IMPLANT
GOWN STRL REUS W/TWL LRG LVL3 (GOWN DISPOSABLE)
GOWN STRL REUS W/TWL XL LVL3 (GOWN DISPOSABLE) ×4
HANDPIECE INTERPULSE COAX TIP (DISPOSABLE) ×2
IMMOBILIZER KNEE 22 UNIV (SOFTGOODS) ×3 IMPLANT
KIT BASIN OR (CUSTOM PROCEDURE TRAY) ×3 IMPLANT
KIT TURNOVER KIT B (KITS) ×3 IMPLANT
KNEE PATELLA ASYMMETRIC 10X32 (Knees) ×3 IMPLANT
MANIFOLD NEPTUNE II (INSTRUMENTS) ×3 IMPLANT
NEEDLE 18GX1X1/2 (RX/OR ONLY) (NEEDLE) IMPLANT
NS IRRIG 1000ML POUR BTL (IV SOLUTION) ×3 IMPLANT
PACK TOTAL JOINT (CUSTOM PROCEDURE TRAY) ×3 IMPLANT
PAD ABD 8X10 STRL (GAUZE/BANDAGES/DRESSINGS) ×3 IMPLANT
PAD ARMBOARD 7.5X6 YLW CONV (MISCELLANEOUS) ×3 IMPLANT
PADDING CAST COTTON 6X4 STRL (CAST SUPPLIES) ×3 IMPLANT
PIN FLUTED HEDLESS FIX 3.5X1/8 (PIN) ×3 IMPLANT
SET HNDPC FAN SPRY TIP SCT (DISPOSABLE) ×1 IMPLANT
SET PAD KNEE POSITIONER (MISCELLANEOUS) ×6 IMPLANT
STAPLER VISISTAT 35W (STAPLE) IMPLANT
STRIP CLOSURE SKIN 1/2X4 (GAUZE/BANDAGES/DRESSINGS) IMPLANT
SUCTION FRAZIER HANDLE 10FR (MISCELLANEOUS) ×2
SUCTION TUBE FRAZIER 10FR DISP (MISCELLANEOUS) ×1 IMPLANT
SUT MNCRL AB 4-0 PS2 18 (SUTURE) IMPLANT
SUT VIC AB 0 CT1 27 (SUTURE) ×3
SUT VIC AB 0 CT1 27XBRD ANBCTR (SUTURE) ×1 IMPLANT
SUT VIC AB 1 CT1 27 (SUTURE) ×4
SUT VIC AB 1 CT1 27XBRD ANBCTR (SUTURE) ×2 IMPLANT
SUT VIC AB 2-0 CT1 27 (SUTURE) ×6
SUT VIC AB 2-0 CT1 TAPERPNT 27 (SUTURE) ×2 IMPLANT
SYR 50ML LL SCALE MARK (SYRINGE) IMPLANT
TIBIAL BASEPLATE SZ2 TRI (Joint) ×3 IMPLANT
TOWEL GREEN STERILE (TOWEL DISPOSABLE) ×3 IMPLANT
TOWEL GREEN STERILE FF (TOWEL DISPOSABLE) ×3 IMPLANT
TRAY CATH 16FR W/PLASTIC CATH (SET/KITS/TRAYS/PACK) IMPLANT
WRAP KNEE MAXI GEL POST OP (GAUZE/BANDAGES/DRESSINGS) ×3 IMPLANT

## 2019-04-29 NOTE — Care Management (Signed)
CM consult acknowledged to assist with any HH/DME needs. Awaiting PT/OT eval for DCP recommendations and will continue to follow.  Mahonri Seiden RN, BSN, NCM-BC, ACM-RN [phone] 

## 2019-04-29 NOTE — Op Note (Signed)
NAME: Sharon [name], Sharon [name] [facility] MEDICAL RECORD [id number] ACCOUNT [id number] DATE OF BIRTH:October 13, 1965 FACILITY: [facility] LOCATION: [facility] PHYSICIAN:Greydon Betke Kerry [place], MD  OPERATIVE REPORT  DATE OF PROCEDURE:  04/29/2019  PREOPERATIVE DIAGNOSIS:  Primary osteoarthritis and degenerative joint disease, left knee.  POSTOPERATIVE DIAGNOSIS:  Primary osteoarthritis and degenerative joint disease, left knee.  PROCEDURE:  Left total knee arthroplasty.  IMPLANTS:  Stryker Triathlon press-fit knee system with size 2 femur, size 2 tibial tray, 9 mm thickness polyethylene insert, size 32 patellar button.  SURGEON:  Jonn Shingles, MD  ASSISTANT:  Erskine Emery, PA-C  ANESTHESIA: 1.  Left lower extremity adductor canal block 2.  Spinal.  ANTIBIOTICS:  900 mg IV clindamycin.  TOURNIQUET TIME:  Less than 1 hour.  ESTIMATED BLOOD LOSS:  100 mL.  COMPLICATIONS:  None.  INDICATIONS:  The patient is a 53 year old female who I have known for several years now.  She has been having worsening arthritis involving her left knee.  X-rays still have shown a well maintained joint space, but we did obtain an MRI several years ago  showing wear of the cartilage in her knee and a meniscal tear.  She has had an arthroscopic intervention of that knee and it did not help.  She still continues to have recurrent effusions of her knee and I have drained the knee on numerous occasions.   We have tried steroid injections and hyaluronic acid.  Another MRI was obtained in the last series showing moderate to significant chondrosis with loss of cartilage of the medial compartment of her knee, as well as patellofemoral joint.  With the  continued pain she has been having and recurrent effusions, she had become frustrated and no other options than knee replacement surgery.  She wished to proceed with this route after the detrimental affect her knee arthritis has had on her mobility, her  quality of life and  activities of daily living.  With surgery, she understands there is risk of acute blood loss anemia, nerve and vessel injury, fracture, infection, DVT and implant failure.  She understands our goals are to decrease pain, improve  mobility and overall improve quality of life.  DESCRIPTION OF PROCEDURE:  After informed consent was obtained, the appropriate left knee was marked.  She was brought to the operating room after an adductor canal block was obtained in the holding room of her left lower extremity.  In the operating  room, she was sat up on the operating table and spinal anesthesia was obtained.  She was laid in the supine position.  A Foley catheter was placed and nonsterile tourniquet was placed around her upper left thigh.  Her left thigh, knee, leg and ankle were  prepped and draped with DuraPrep and sterile drapes.  A time-out was called, identifying correct patient and  correct left knee.  We then used an Esmarch to wrap around the knee and the tourniquet was inflated to 250 mm of pressure.  We then made a  direct midline incision over the knee and the patella and carried this proximally and distally.  We dissected down the knee joint, carried out a medial parapatellar arthrotomy, finding a moderate joint effusion and significant wear of the cartilage of  the patellofemoral joint and the medial compartment.  The knee alignment was overall neutral.  With the knee in a flexed position, we removed the ACL, PCL, medial and lateral meniscus.  We removed periarticular osteophytes from the knee as well.  I then  used an extramedullary cutting  guide fusion for making our proximal tibia cut, correcting for varus and valgus and neutral slope.  We set our cutting guide to take 9 mm off the high side.  We made this cut without difficulty.  We then went to the notch  of the femur for an intramedullary guide for making our distal femoral cut for a left knee at 5 degrees externally rotated and an 8 mm distal  femoral cut.  We made this cut without difficulty and brought the knee back down to full extension, removed  remnants of the meniscus from the back of the knee and then placed a 9 mm extension block and we did obtain full extension.  We then went back to the femur and put our femoral sizing guide based off the epicondylar axis and Whiteside line.  We chose a  size 2 femur based off of this. This was for a left knee.  We put our 4-in-1 cutting block for a size 2 and made our anterior and posterior cuts, followed by chamfer cuts.  We then made our femoral box cut.  Attention was then turned back to the tibia.   With the knee in a flexed position, we chose a size 2 tibia tray for coverage, setting the rotation off the tibial tubercle and the femur.  We made the keel punch without difficulty and we felt like she had nice good solid bone for a press-fit knee.  We  placed the trial size 2 tibia, followed by the size 2 femur and placed a 9 mm thickness trial polyethylene liner.  We brought the knee back down to full extension and we had achieved full extension and flexion and it felt stable on stressing the  ligaments of the knee.  We then made our patellar cut and drilled 3 holes for a size 32 press-fit patellar button.  With all trial instrumentation in place, we put the knee again through range of motion and it was felt to be stable.  We then irrigated  the knee with normal saline solution using pulsatile lavage.  We removed all trial components from the knee.  We then placed our real press-fit tibial component, size 2, followed by the real size 2 left femur.  We placed our 9 mm fixed bearing  polyethylene insert and press-fit our size 32 patellar button.  We put the knee again through range of motion and I was pleased with stability.  We then irrigated the knee again with normal saline solution using pulsatile lavage.  The tourniquet was let  down.  Hemostasis was obtained with electrocautery.  We then closed  the arthrotomy with interrupted #1 Vicryl suture, followed by closing the deep tissue with 0 Vicryl, 2-0 Vicryl was used to close the subcutaneous tissue and interrupted staples were  used to close the skin.  Xeroform and well-padded sterile dressing was applied.  She was taken to the recovery room in stable condition.  All final counts were correct.  There were no complications noted.  Of note, Benita Stabile, PA-C, assisted the entire  case.  His assistance was crucial for facilitating all aspects of this case.  VN/NUANCE  D:04/29/2019 T:04/29/2019 JOB:[id number]

## 2019-04-29 NOTE — Progress Notes (Signed)
Orthopedic Tech Progress Note Patient Details:  Kat Plaza 1965-07-15 [id number]  CPM Left Knee CPM Left Knee: On Left Knee Flexion (Degrees): 0 Left Knee Extension (Degrees): 60 Additional Comments: could no apply frame bed has old frame set up  Post Interventions Patient Tolerated: Well Instructions Provided: Care of device  Braulio Bosch 04/29/2019, 3:07 PM

## 2019-04-29 NOTE — Anesthesia Preprocedure Evaluation (Addendum)
Anesthesia Evaluation  Patient identified by MRN, date of birth, ID band Patient awake    Reviewed: Allergy & Precautions, NPO status , Patient's Chart, lab work & pertinent test results, reviewed documented beta blocker date and time   History of Anesthesia Complications (+) PONV and history of anesthetic complications  Airway Mallampati: II  TM Distance: >3 FB Neck ROM: Full    Dental  (+) Dental Advisory Given   Pulmonary neg recent URI, PE   breath sounds clear to auscultation       Cardiovascular hypertension, Pt. on medications and Pt. on home beta blockers  Rhythm:Regular     Neuro/Psych PSYCHIATRIC DISORDERS Anxiety negative neurological ROS     GI/Hepatic Neg liver ROS, GERD  Medicated and Controlled,  Endo/Other  negative endocrine ROS  Renal/GU negative Renal ROS     Musculoskeletal  (+) Arthritis ,   Abdominal   Peds  Hematology negative hematology ROS (+) Denies blood thinners   Anesthesia Other Findings - Left ventricle: The cavity size was normal. Wall thickness  was normal. Systolic function was normal. The estimated  ejection fraction was in the range of 55% to 60%. Wall  motion was normal; there were no regional wall motion  abnormalities. Doppler parameters are consistent with  abnormal left ventricular relaxation (grade 1 diastolic  dysfunction).   Reproductive/Obstetrics                            Anesthesia Physical Anesthesia Plan  ASA: II  Anesthesia Plan: MAC, Regional and Spinal   Post-op Pain Management:  Regional for Post-op pain   Induction: Intravenous  PONV Risk Score and Plan: 3 and Treatment may vary due to age or medical condition and Propofol infusion  Airway Management Planned: Nasal Cannula  Additional Equipment: None  Intra-op Plan:   Post-operative Plan:   Informed Consent: I have reviewed the patients History and  Physical, chart, labs and discussed the procedure including the risks, benefits and alternatives for the proposed anesthesia with the patient or authorized representative who has indicated his/her understanding and acceptance.     Dental advisory given  Plan Discussed with: CRNA and Surgeon  Anesthesia Plan Comments:         Anesthesia Quick Evaluation

## 2019-04-29 NOTE — Anesthesia Procedure Notes (Addendum)
Anesthesia Regional Block: Adductor canal block   Pre-Anesthetic Checklist: ,, timeout performed, Correct Patient, Correct Site, Correct Laterality, Correct Procedure, Correct Position, site marked, Risks and benefits discussed,  Surgical consent,  Pre-op evaluation,  At surgeon's request and post-op pain management  Laterality: Left and Lower  Prep: chloraprep       Needles:  Injection technique: Single-shot     Needle Length: 9cm  Needle Gauge: 22     Additional Needles: Arrow StimuQuik ECHO Echogenic Stimulating PNB Needle  Procedures:,,,, ultrasound used (permanent image in chart),,,,  Narrative:  Start time: 04/29/2019 11:40 AM End time: 04/29/2019 11:43 AM Injection made incrementally with aspirations every 5 mL.  Performed by: Personally  Anesthesiologist: Oleta Mouse, MD

## 2019-04-29 NOTE — Transfer of Care (Signed)
Immediate Anesthesia Transfer of Care Note  Patient: Sharon [name]  Procedure(s) Performed: LEFT TOTAL KNEE ARTHROPLASTY (Left Knee)  Patient Location: PACU  Anesthesia Type:Spinal and MAC combined with regional for post-op pain  Level of Consciousness: drowsy and patient cooperative  Airway & Oxygen Therapy: Patient Spontanous Breathing  Post-op Assessment: Report given to RN and Post -op Vital signs reviewed and stable  Post vital signs: Reviewed and stable  Last Vitals:  Vitals Value Taken Time  BP 109/73 04/29/19 1403  Temp    Pulse 64 04/29/19 1404  Resp 13 04/29/19 1404  SpO2 100 % 04/29/19 1404  Vitals shown include unvalidated device data.  Last Pain:  Vitals:   04/29/19 1035  TempSrc:   PainSc: 4       Patients Stated Pain Goal: 3 (32/25/67 2091)  Complications: No apparent anesthesia complications

## 2019-04-29 NOTE — Plan of Care (Signed)

## 2019-04-29 NOTE — Anesthesia Procedure Notes (Signed)
Spinal  Patient location during procedure: OR Start time: 04/29/2019 12:18 PM End time: 04/29/2019 12:23 PM Staffing Anesthesiologist: Oleta Mouse, MD Performed: anesthesiologist  Preanesthetic Checklist Completed: patient identified, surgical consent, pre-op evaluation, timeout performed, IV checked, risks and benefits discussed and monitors and equipment checked Spinal Block Patient position: sitting Prep: DuraPrep Patient monitoring: heart rate, cardiac monitor, continuous pulse ox and blood pressure Approach: midline Location: L3-4 Injection technique: single-shot Needle Needle type: Pencan  Needle gauge: 24 G Needle length: 9 cm Assessment Sensory level: T6

## 2019-04-29 NOTE — Brief Op Note (Signed)
04/29/2019  1:43 PM  PATIENT:  Ulice Brilliant  53 y.o. female  PRE-OPERATIVE DIAGNOSIS:  osteoarthritis left knee  POST-OPERATIVE DIAGNOSIS:  osteoarthritis left knee  PROCEDURE:  Procedure(s): LEFT TOTAL KNEE ARTHROPLASTY (Left)  SURGEON:  Surgeon(s) and Role:    Mcarthur Rossetti, MD - Primary  PHYSICIAN ASSISTANT: Benita Stabile, PA-C  ANESTHESIA:   regional and spinal  COUNTS:  YES  TOURNIQUET:  * Missing tourniquet times found for documented tourniquets in log: [id number] *  DICTATION: .Other Dictation: Dictation Number [id number]  PLAN OF CARE: Admit to inpatient   PATIENT DISPOSITION:  PACU - hemodynamically stable.   Delay start of Pharmacological VTE agent (>24hrs) due to surgical blood loss or risk of bleeding: no

## 2019-04-29 NOTE — H&P (Signed)
TOTAL KNEE ADMISSION H&P  Patient is being admitted for left total knee arthroplasty.  Subjective:  Chief Complaint:left knee pain.  HPI: Sharon [name], 53 y.o. female, has a history of pain and functional disability in the left knee due to arthritis and has failed non-surgical conservative treatments for greater than 12 weeks to includeNSAID's and/or analgesics, corticosteriod injections, viscosupplementation injections, flexibility and strengthening excercises and activity modification.  Onset of symptoms was gradual, starting 4 years ago with gradually worsening course since that time. The patient noted prior procedures on the knee to include  arthroscopy and menisectomy on the left knee(s).  Patient currently rates pain in the left knee(s) at 10 out of 10 with activity. Patient has night pain, worsening of pain with activity and weight bearing, pain that interferes with activities of daily living, pain with passive range of motion, crepitus and joint swelling.  Patient has evidence of subchondral sclerosis, periarticular osteophytes and joint space narrowing by imaging studies. There is no active infection.  Patient Active Problem List   Diagnosis Date Noted  . Impingement syndrome of right shoulder 10/28/2018  . Chronic pain of left knee 03/27/2018  . Chronic right shoulder pain 03/27/2018  . Cervicalgia 03/27/2018  . Status post arthroscopy of left knee 08/06/2017  . Unilateral primary osteoarthritis, left knee 08/06/2017  . Tremor 07/10/2017  . Other meniscus derangements, posterior horn of medial meniscus, left knee 06/28/2017  . Ureteral stone 05/23/2017  . History of sepsis 05/23/2017  . Acute pain of left knee 04/09/2017  . Trochanteric bursitis, right hip 09/27/2016  . GAD (generalized anxiety disorder) 05/31/2014  . Rectocele 08/21/2011  . Uterovaginal prolapse 08/21/2011  . Dyspnea 10/13/2010  . Hypertension 10/13/2010   Past Medical History:  Diagnosis Date  .  Allergy   . Anal fissure   . Anemia    borderline  . Anxiety   . Arthritis   . Blood clot in vein    left leg  . Detached retina    BOTH SCLERA BUCKLE BOTH EYES  . DVT (deep venous thrombosis) (HCC)    left leg  . Endometriosis   . Family history of adverse reaction to anesthesia    DAUGHTER POST OP PONV  . GERD (gastroesophageal reflux disease)   . Glaucoma    RIGHT WORST THAN LEFT  . Heart murmur    "caused by anxiety"   . History of hemorrhoids   . History of kidney stones   . Hypertension   . IBS (irritable bowel syndrome)   . Perforated eardrum, right    SMALL HOLE  . PONV (postoperative nausea and vomiting)   . Pulmonary embolism ([place]) 6 YRS AGO    Past Surgical History:  Procedure Laterality Date  . ABDOMINAL HYSTERECTOMY    . CATARACT EXTRACTION Bilateral 2015  . COLONOSCOPY    . CYSTOSCOPY W/ URETERAL STENT PLACEMENT Right 05/23/2017   Procedure: CYSTOSCOPY WITH RETROGRADE PYELOGRAM/URETERAL RIGHT STENT PLACEMENT;  Surgeon: Bjorn Loser, MD;  Location: [facility];  Service: Urology;  Laterality: Right;  . CYSTOSCOPY W/ URETERAL STENT PLACEMENT Right 06/04/2017   Procedure: CYSTOSCOPY WITHRIGHT RETROGRADE PYELOGRAMRIGHT /URETEREOSCOPY  STENT PLACEMENT;  Surgeon: Lucas Mallow, MD;  Location: [facility];  Service: Urology;  Laterality: Right;  . ENDOMETRIAL ABLATION    . EYE SURGERY Bilateral    cataract surgery with lens implants  . [place]  2011  . INCONTINENCE SURGERY    . INNER EAR SURGERY     X  2  . KNEE SURGERY    . RECTAL PROLAPSE REPAIR    . RETINAL DETACHMENT SURGERY Bilateral 2000  . RETINAL DETACHMENT SURGERY Bilateral   . TUBAL LIGATION    . VEIN SURGERY Left 04/2010    Current Facility-Administered Medications  Medication Dose Route Frequency Provider Last Rate Last Dose  . chlorhexidine (HIBICLENS) 4 % liquid 4 application  60 mL Topical Once Erskine Emery W, PA-C      . clindamycin (CLEOCIN) IVPB 900 mg  900  mg Intravenous On Call to OR Pete Pelt, PA-C      . fentaNYL (SUBLIMAZE) 100 MCG/2ML injection           . lactated ringers infusion   Intravenous Continuous Hodierne, Adam, MD      . midazolam (VERSED) 2 MG/2ML injection           . povidone-iodine 10 % swab 2 application  2 application Topical Once Erskine Emery W, PA-C      . tranexamic acid (CYKLOKAPRON) 1000MG /17mL IVPB           . tranexamic acid (CYKLOKAPRON) IVPB 1,000 mg  1,000 mg Intravenous To OR Mcarthur Rossetti, MD       Allergies  Allergen Reactions  . Dextromethorphan Polistirex Er Other (See Comments)    Pt states caused "a lump" in her throat, making it difficult to swallow  . Diflucan [Fluconazole]     Primidone interaction  . Hydrocodone Nausea And Vomiting  . Oxycodone-Acetaminophen Other (See Comments)    Made fingers tingle and go numb, started "feeling weird".   . Penicillins Other (See Comments)    Reaction unknown from childhood Has patient had a PCN reaction causing immediate rash, facial/tongue/throat swelling, SOB or lightheadedness with hypotension: unknown Has patient had a PCN reaction causing severe rash involving mucus membranes or skin necrosis: unknown  Has patient had a PCN reaction that required hospitalization: no Has patient had a PCN reaction occurring within the last 10 years: no If all of the above answers are "NO", then may proceed with Cephalosporin use.   . Tape Itching and Other (See Comments)    Bandaids - leaves red marks    Social History   Tobacco Use  . Smoking status: Never Smoker  . Smokeless tobacco: Never Used  Substance Use Topics  . Alcohol use: Not Currently    Frequency: Never    Comment: occ    Family History  Problem Relation Age of Onset  . Emphysema Mother        smoker  . Allergies Mother   . Asthma Mother   . Heart disease Mother        AMI as cause of death  . COPD Mother   . Asthma Father   . Heart disease Father        AMI as cause of  death  . Diabetes Father   . Asthma Brother   . Heart disease Brother 74       heart failure  . Lung cancer Maternal Grandfather        was a smoker  . Cancer Maternal Grandfather        lung  . Heart disease Paternal Grandmother   . Heart disease Paternal Grandfather   . Colon cancer Neg Hx      Review of Systems  Musculoskeletal: Positive for joint pain.  All other systems reviewed and are negative.   Objective:  Physical Exam  Constitutional: She is oriented  to person, place, and time. She appears well-developed and well-nourished.  HENT:  Head: Normocephalic and atraumatic.  Eyes: Pupils are equal, round, and reactive to light. EOM are normal.  Neck: Normal range of motion. Neck supple.  Cardiovascular: Normal rate and regular rhythm.  Respiratory: Effort normal and breath sounds normal.  GI: Soft. Bowel sounds are normal.  Musculoskeletal:     Left knee: She exhibits effusion and abnormal meniscus. Tenderness found. Medial joint line and lateral joint line tenderness noted.  Neurological: She is alert and oriented to person, place, and time.  Skin: Skin is warm and dry.  Psychiatric: She has a normal mood and affect.    Vital signs in last 24 hours: Temp:  [98.6 F (37 C)] 98.6 F (37 C) (11/10 1009) Pulse Rate:  [74] 74 (11/10 1009) Resp:  [20] 20 (11/10 1009) BP: (113)/(65) 113/65 (11/10 1009) SpO2:  [100 %] 100 % (11/10 1009) Weight:  [73.1 kg] 73.1 kg (11/10 1009)  Labs:   Estimated body mass index is 24.87 kg/m as calculated from the following:   Height as of this encounter: 5' 7.5" (1.715 m).   Weight as of this encounter: 73.1 kg.   Imaging Review Plain radiographs demonstrate severe degenerative joint disease of the left knee(s). The overall alignment isneutral. The bone quality appears to be excellent for age and reported activity level.      Assessment/Plan:  End stage arthritis, left knee   The patient history, physical examination,  clinical judgment of the provider and imaging studies are consistent with end stage degenerative joint disease of the left knee(s) and total knee arthroplasty is deemed medically necessary. The treatment options including medical management, injection therapy arthroscopy and arthroplasty were discussed at length. The risks and benefits of total knee arthroplasty were presented and reviewed. The risks due to aseptic loosening, infection, stiffness, patella tracking problems, thromboembolic complications and other imponderables were discussed. The patient acknowledged the explanation, agreed to proceed with the plan and consent was signed. Patient is being admitted for inpatient treatment for surgery, pain control, PT, OT, prophylactic antibiotics, VTE prophylaxis, progressive ambulation and ADL's and discharge planning. The patient is planning to be discharged home with home health services

## 2019-04-30 ENCOUNTER — Encounter (HOSPITAL_COMMUNITY): Payer: Self-pay | Admitting: General Practice

## 2019-04-30 LAB — BASIC METABOLIC PANEL
Anion gap: 7 (ref 5–15)
BUN: 13 mg/dL (ref 6–20)
CO2: 25 mmol/L (ref 22–32)
Calcium: 8.5 mg/dL — ABNORMAL LOW (ref 8.9–10.3)
Chloride: 104 mmol/L (ref 98–111)
Creatinine, Ser: 0.69 mg/dL (ref 0.44–1.00)
GFR calc Af Amer: >60 mL/min (ref >60)
GFR calc non Af Amer: >60 mL/min (ref >60)
Glucose, Bld: 133 mg/dL — ABNORMAL HIGH (ref 70–99)
Potassium: 4 mmol/L (ref 3.5–5.1)
Sodium: 136 mmol/L (ref 135–145)

## 2019-04-30 LAB — CBC
HCT: 28.2 % — ABNORMAL LOW (ref 36.0–46.0)
Hemoglobin: 9.4 g/dL — ABNORMAL LOW (ref 12.0–15.0)
MCH: 30.3 pg (ref 26.0–34.0)
MCHC: 33.3 g/dL (ref 30.0–36.0)
MCV: 91 fL (ref 80.0–100.0)
Platelets: 231 10*3/uL (ref 150–400)
RBC: 3.1 MIL/uL — ABNORMAL LOW (ref 3.87–5.11)
RDW: 12.5 % (ref 11.5–15.5)
WBC: 8.2 10*3/uL (ref 4.0–10.5)
nRBC: 0 % (ref 0.0–0.2)

## 2019-04-30 MED ORDER — CHLORHEXIDINE GLUCONATE CLOTH 2 % EX PADS
6.0000 | MEDICATED_PAD | Freq: Every day | CUTANEOUS | Status: DC
  Administered 2019-04-30 – 2019-05-02 (×3): 6 via TOPICAL

## 2019-04-30 NOTE — Evaluation (Signed)
Physical Therapy Evaluation Patient Details Name: Sharon [name] MRN: [id number] DOB: Oct 30, 1965 Today's Date: 04/30/2019   History of Present Illness  53 y.o female s/p L TKA with significant PMH including OA, generalized anxiety disorder, HTN, cervicalgia and impingement of R shoulder.  Clinical Impression  Pt presents with an overall decrease in functional mobility secondary to above. PTA, pt was independent an enjoys gardening. Educ on precautions, positioning, therex, and importance of mobility, this therapist provided TKA HEP handout. Today, pt able to perform bed mobility with min assist for L LE management, min guard assist for transfers and gait up to 10 ft with RW. Pt with increased anxiety and nausea with OOB activity, requesting to lay back down at end of session. Ice applied for L knee pain management and edema control . Pt would benefit from continued acute PT services to maximize functional mobility and independence prior to d/c home with home health.      Follow Up Recommendations Home health PT    Equipment Recommendations  Rolling walker with 5" wheels    Recommendations for Other Services OT consult     Precautions / Restrictions Precautions Precautions: Fall Restrictions Weight Bearing Restrictions: Yes LLE Weight Bearing: Weight bearing as tolerated      Mobility  Bed Mobility Overal bed mobility: Needs Assistance Bed Mobility: Supine to Sit;Sit to Supine     Supine to sit: Min assist Sit to supine: Min assist   General bed mobility comments: min assist for L LE management during bed mobility  Transfers Overall transfer level: Needs assistance Equipment used: Rolling walker (2 wheeled) Transfers: Sit to/from Stand Sit to Stand: Min guard         General transfer comment: cues for hand placement on RW and techniques for sit<>stand  Ambulation/Gait Ambulation/Gait assistance: Min guard Gait Distance (Feet): 10 Feet Assistive device: Rolling  walker (2 wheeled) Gait Pattern/deviations: Step-to pattern;Decreased stance time - left Gait velocity: decreased Gait velocity interpretation: <1.31 ft/sec, indicative of household ambulator General Gait Details: antalgic gait pattern, limited weightbearing through L LE with step to pattern  Stairs Stairs: (pt declined stair training this session, will reassess next session)          Wheelchair Mobility    Modified Rankin (Stroke Patients Only)       Balance Overall balance assessment: Needs assistance Sitting-balance support: Feet supported Sitting balance-Leahy Scale: Good Sitting balance - Comments: WFL     Standing balance-Leahy Scale: Poor Standing balance comment: reliant on UE support with RW for standing balance                             Pertinent Vitals/Pain Pain Assessment: Faces Faces Pain Scale: Hurts even more Pain Descriptors / Indicators: Aching;Throbbing;Grimacing Pain Intervention(s): Limited activity within patient's tolerance;Monitored during session;Repositioned    Home Living Family/patient expects to be discharged to:: Private residence Living Arrangements: Alone Available Help at Discharge: Friend(s);Available PRN/intermittently(boyfriend will assist at d/c per pt report) Type of Home: House Home Access: Stairs to enter Entrance Stairs-Rails: None Entrance Stairs-Number of Steps: 2 STE Home Layout: One level Home Equipment: None      Prior Function Level of Independence: Independent               Hand Dominance        Extremity/Trunk Assessment   Upper Extremity Assessment Upper Extremity Assessment: Overall WFL for tasks assessed    Lower Extremity Assessment Lower Extremity Assessment:  LLE deficits/detail LLE Deficits / Details: lacking 5 degrees of knee extension, knee flexion PROM to 35 degrees LLE: Unable to fully assess due to pain       Communication   Communication: No difficulties  Cognition  Arousal/Alertness: Awake/alert Behavior During Therapy: WFL for tasks assessed/performed Overall Cognitive Status: Within Functional Limits for tasks assessed                                 General Comments: Pt anxious with mobility this session      General Comments General comments (skin integrity, edema, etc.): ACE wrap donned L knee    Exercises Total Joint Exercises Ankle Circles/Pumps: AROM;5 reps;Left(TKA HEP handout given) Quad Sets: AROM;Left;5 reps Heel Slides: Left;5 reps;AAROM   Assessment/Plan    PT Assessment Patient needs continued PT services  PT Problem List Decreased strength;Decreased mobility;Decreased range of motion;Decreased coordination;Decreased activity tolerance;Decreased cognition;Decreased skin integrity;Decreased balance;Impaired sensation;Pain       PT Treatment Interventions DME instruction;Therapeutic activities;Gait training;Therapeutic exercise;Patient/family education;Stair training;Balance training;Functional mobility training;Neuromuscular re-education;Manual techniques    PT Goals (Current goals can be found in the Care Plan section)  Acute Rehab PT Goals Patient Stated Goal: "get my knee to feel better" PT Goal Formulation: With patient Time For Goal Achievement: 05/14/19 Potential to Achieve Goals: Good    Frequency 7X/week   Barriers to discharge Inaccessible home environment 2 STE without rails    Co-evaluation               AM-PAC PT "6 Clicks" Mobility  Outcome Measure Help needed turning from your back to your side while in a flat bed without using bedrails?: None Help needed moving from lying on your back to sitting on the side of a flat bed without using bedrails?: A Little Help needed moving to and from a bed to a chair (including a wheelchair)?: A Little Help needed standing up from a chair using your arms (e.g., wheelchair or bedside chair)?: A Little Help needed to walk in hospital room?: A  Little Help needed climbing 3-5 steps with a railing? : A Little 6 Click Score: 19    End of Session Equipment Utilized During Treatment: Gait belt Activity Tolerance: Patient limited by pain;Other (comment)(nausea with upright mobliity) Patient left: in bed;with bed alarm set   PT Visit Diagnosis: Pain;Muscle weakness (generalized) (M62.81) Pain - Right/Left: Left Pain - part of body: Knee    Time: [id number] PT Time Calculation (min) (ACUTE ONLY): 30 min   Charges:   PT Evaluation $PT Eval Low Complexity: 1 Low PT Treatments $Gait Training: 8-22 mins        Netta Corrigan, PT, DPT, CSRS Acute Rehab Office [place] 04/30/2019, 10:29 AM

## 2019-04-30 NOTE — Plan of Care (Signed)
  Problem: Clinical Measurements: Goal: Ability to maintain clinical measurements within normal limits will improve Outcome: Progressing   Problem: Elimination: Goal: Will not experience complications related to bowel motility Outcome: Progressing   Problem: Pain Managment: Goal: General experience of comfort will improve Outcome: Progressing   Problem: Safety: Goal: Ability to remain free from injury will improve Outcome: Progressing   

## 2019-04-30 NOTE — Discharge Instructions (Signed)

## 2019-04-30 NOTE — Progress Notes (Signed)
Foley removed Per MD order. Pt due to void by 12:45pm today. Will follow up with dayshift nurse

## 2019-04-30 NOTE — Progress Notes (Signed)
Subjective: 1 Day Post-Op Procedure(s) (LRB): LEFT TOTAL KNEE ARTHROPLASTY (Left) Patient reports pain as moderate.  Acute blood loss anemia from surgery - will monitor.  Objective: Vital signs in last 24 hours: Temp:  [97.7 F (36.5 C)-99 F (37.2 C)] 99 F (37.2 C) (11/11 0257) Pulse Rate:  [54-79] 79 (11/11 0257) Resp:  [11-20] 16 (11/10 1624) BP: (98-159)/(56-84) 98/56 (11/11 0257) SpO2:  [97 %-100 %] 99 % (11/11 0257) Weight:  [73.1 kg] 73.1 kg (11/10 1009)  Intake/Output from previous day: 11/10 0701 - 11/11 0700 In: 3330 [P.O.:580; I.V.:2700; IV Piggyback:50] Out: 2200 [Urine:2150; Blood:50] Intake/Output this shift: No intake/output data recorded.  Recent Labs    04/30/19 0414  HGB 9.4*   Recent Labs    04/30/19 0414  WBC 8.2  RBC 3.10*  HCT 28.2*  PLT 231   Recent Labs    04/30/19 0414  NA 136  K 4.0  CL 104  CO2 25  BUN 13  CREATININE 0.69  GLUCOSE 133*  CALCIUM 8.5*   No results for input(s): LABPT, INR in the last 72 hours.  Sensation intact distally Intact pulses distally Dorsiflexion/Plantar flexion intact Incision: dressing C/D/I Compartment soft   Assessment/Plan: 1 Day Post-Op Procedure(s) (LRB): LEFT TOTAL KNEE ARTHROPLASTY (Left) Up with therapy Plan for discharge tomorrow Discharge home with home health   Anticipated LOS equal to or greater than 2 midnights due to - Age 54 and older with one or more of the following:  - Obesity  - Expected need for hospital services (PT, OT, Nursing) required for safe  discharge  - Anticipated need for postoperative skilled nursing care or inpatient rehab  - Active co-morbidities: None OR   - Unanticipated findings during/Post Surgery: None  - Patient is a high risk of re-admission due to: None    Mcarthur Rossetti 04/30/2019, 7:08 AM

## 2019-04-30 NOTE — Progress Notes (Signed)
Physical Therapy Treatment Patient Details Name: Sharon [name] MRN: [id number] DOB: Nov 01, 1965 Today's Date: 04/30/2019    History of Present Illness 53 y.o female s/p L TKA with significant PMH including OA, generalized anxiety disorder, HTN, cervicalgia and impingement of R shoulder.    PT Comments    Pt progressing with functional mobility and OOB activity tolerance. Pt pre-medicated this session and reports feeling much better, willing to get up to use bathroom and sit up in recliner. Pt ambulated within the room with RW and min guard assist, cues for gait pattern. Pt tolerated continued L LE strength and ROM education, pt able to perform mini squats in standing, quad sets and heel sides. Discussed trying the steps tomorrow prior to d/c, pt agreeable. Pt will continue to benefit from skilled PT to address L knee strength/ROM, functional mobility and independence with transfers.  Follow Up Recommendations  Home health PT     Equipment Recommendations  Rolling walker with 5" wheels    Recommendations for Other Services       Precautions / Restrictions Precautions Precautions: Fall Restrictions Weight Bearing Restrictions: Yes LLE Weight Bearing: Weight bearing as tolerated    Mobility  Bed Mobility Overal bed mobility: Needs Assistance Bed Mobility: Supine to Sit     Supine to sit: Supervision     General bed mobility comments: Pt uses her UEs to help guide L LE off the bed, all with supervision and cues for techniques  Transfers Overall transfer level: Needs assistance Equipment used: Rolling walker (2 wheeled) Transfers: Sit to/from Stand Sit to Stand: Min guard         General transfer comment: cues for hand placement on RW and techniques for sit<>stand  Ambulation/Gait Ambulation/Gait assistance: Min guard Gait Distance (Feet): 10 Feet(+10) Assistive device: Rolling walker (2 wheeled) Gait Pattern/deviations: Step-to pattern;Decreased stance time -  left;Decreased step length - right Gait velocity: decreased   General Gait Details: antalgic gait pattern, limited weightbearing through L LE with step to pattern, ambulated from bed>bathroom>recliner this session with min guard assist and RW   Stairs                 Balance Overall balance assessment: Needs assistance Sitting-balance support: Feet supported Sitting balance-Leahy Scale: Good Sitting balance - Comments: WFL     Standing balance-Leahy Scale: Poor Standing balance comment: reliant on UE support with RW for standing balance                            Cognition Arousal/Alertness: Awake/alert Behavior During Therapy: WFL for tasks assessed/performed Overall Cognitive Status: Within Functional Limits for tasks assessed                                 General Comments: pt pre-medicated for this session, less axious and more eager to get OOB      Exercises Total Joint Exercises Quad Sets: AROM;Left;5 reps;Seated(in recliner) Heel Slides: Left;5 reps;AAROM;Seated(seated EOB) Knee Flexion: Standing;AROM;5 reps;Left(performed x 5 mini squats this session working on knee flexion ROM while bending to squat and working on increasing knee extension when returning to stand)    General Comments General comments (skin integrity, edema, etc.): Dressing over L knee sutures/incision      Pertinent Vitals/Pain Pain Assessment: Faces Faces Pain Scale: Hurts little more Pain Descriptors / Indicators: Aching;Throbbing;Grimacing Pain Intervention(s): Limited activity within patient's tolerance;Repositioned;Monitored during session  Home Living                      Prior Function            PT Goals (current goals can now be found in the care plan section) Progress towards PT goals: Progressing toward goals    Frequency    7X/week      PT Plan Current plan remains appropriate    Co-evaluation              AM-PAC  PT "6 Clicks" Mobility   Outcome Measure  Help needed turning from your back to your side while in a flat bed without using bedrails?: None Help needed moving from lying on your back to sitting on the side of a flat bed without using bedrails?: A Little Help needed moving to and from a bed to a chair (including a wheelchair)?: A Little Help needed standing up from a chair using your arms (e.g., wheelchair or bedside chair)?: A Little Help needed to walk in hospital room?: A Little Help needed climbing 3-5 steps with a railing? : A Little 6 Click Score: 19    End of Session Equipment Utilized During Treatment: Gait belt Activity Tolerance: Patient tolerated treatment well Patient left: in chair;with chair alarm set;with call bell/phone within reach Nurse Communication: Mobility status PT Visit Diagnosis: Pain;Muscle weakness (generalized) (M62.81) Pain - Right/Left: Left Pain - part of body: Knee     Time: [id number] PT Time Calculation (min) (ACUTE ONLY): 18 min  Charges:  $Therapeutic Exercise: 8-22 mins                     Netta Corrigan, PT, DPT, CSRS Acute Rehab Office [place] 04/30/2019, 3:03 PM

## 2019-05-01 LAB — CBC
HCT: 27.1 % — ABNORMAL LOW (ref 36.0–46.0)
Hemoglobin: 8.9 g/dL — ABNORMAL LOW (ref 12.0–15.0)
MCH: 29.7 pg (ref 26.0–34.0)
MCHC: 32.8 g/dL (ref 30.0–36.0)
MCV: 90.3 fL (ref 80.0–100.0)
Platelets: 223 10*3/uL (ref 150–400)
RBC: 3 MIL/uL — ABNORMAL LOW (ref 3.87–5.11)
RDW: 12.6 % (ref 11.5–15.5)
WBC: 8.8 10*3/uL (ref 4.0–10.5)
nRBC: 0 % (ref 0.0–0.2)

## 2019-05-01 MED ORDER — OXYCODONE HCL 5 MG PO TABS: 5.0000 mg | ORAL_TABLET | ORAL | 0 refills | Status: DC | PRN

## 2019-05-01 MED ORDER — ONDANSETRON 4 MG PO TBDP: 4.0000 mg | ORAL_TABLET | Freq: Three times a day (TID) | ORAL | 1 refills | Status: DC | PRN

## 2019-05-01 MED ORDER — METHOCARBAMOL 500 MG PO TABS: 500.0000 mg | ORAL_TABLET | Freq: Four times a day (QID) | ORAL | 1 refills | Status: DC | PRN

## 2019-05-01 MED ORDER — ASPIRIN 81 MG PO CHEW: 81.0000 mg | CHEWABLE_TABLET | Freq: Two times a day (BID) | ORAL | 0 refills | Status: DC

## 2019-05-01 NOTE — Plan of Care (Signed)

## 2019-05-01 NOTE — Progress Notes (Signed)
Subjective: 2 Days Post-Op Procedure(s) (LRB): LEFT TOTAL KNEE ARTHROPLASTY (Left) Patient reports pain as moderate.    Objective: Vital signs in last 24 hours: Temp:  [98.4 F (36.9 C)-100.3 F (37.9 C)] 99.3 F (37.4 C) (11/12 0744) Pulse Rate:  [76-91] 76 (11/12 0744) Resp:  [16-18] 16 (11/12 0744) BP: (117-141)/(62-71) 129/69 (11/12 0744) SpO2:  [95 %-99 %] 97 % (11/12 0744)  Intake/Output from previous day: No intake/output data recorded. Intake/Output this shift: No intake/output data recorded.  Recent Labs    04/30/19 0414 05/01/19 0354  HGB 9.4* 8.9*   Recent Labs    04/30/19 0414 05/01/19 0354  WBC 8.2 8.8  RBC 3.10* 3.00*  HCT 28.2* 27.1*  PLT 231 223   Recent Labs    04/30/19 0414  NA 136  K 4.0  CL 104  CO2 25  BUN 13  CREATININE 0.69  GLUCOSE 133*  CALCIUM 8.5*   No results for input(s): LABPT, INR in the last 72 hours.  Sensation intact distally Intact pulses distally Dorsiflexion/Plantar flexion intact Incision: scant drainage No cellulitis present Compartment soft   Assessment/Plan: 2 Days Post-Op Procedure(s) (LRB): LEFT TOTAL KNEE ARTHROPLASTY (Left) Up with therapy Plan for discharge tomorrow Discharge home with home health      Sharon [name] 05/01/2019, 9:46 AM

## 2019-05-01 NOTE — Progress Notes (Signed)
Physical Therapy Treatment Patient Details Name: Sharon [name] MRN: [id number] DOB: May 13, 1966 Today's Date: 05/01/2019    History of Present Illness Pt is a 53 y.o female s/p L TKA with significant PMH including OA, generalized anxiety disorder, HTN, cervicalgia and impingement of R shoulder.    PT Comments    Pt making steady progress with mobility. Remains limited secondary to pain in L knee; however, stating that it feels better as it "loosens up" with ambulation. Plan for stair training at next session prior to d/c home. Pt would continue to benefit from skilled physical therapy services at this time while admitted and after d/c to address the below listed limitations in order to improve overall safety and independence with functional mobility.    Follow Up Recommendations  Home health PT     Equipment Recommendations  Rolling walker with 5" wheels    Recommendations for Other Services       Precautions / Restrictions Precautions Precautions: Fall;Knee Restrictions Weight Bearing Restrictions: Yes LLE Weight Bearing: Weight bearing as tolerated    Mobility  Bed Mobility Overal bed mobility: Needs Assistance Bed Mobility: Supine to Sit;Sit to Supine     Supine to sit: Supervision Sit to supine: Min assist   General bed mobility comments: pt able to use R LE to assist L LE off of and onto bed  Transfers Overall transfer level: Needs assistance Equipment used: Rolling walker (2 wheeled) Transfers: Sit to/from Stand Sit to Stand: Supervision         General transfer comment: for safety; good technique utilized  Ambulation/Gait Ambulation/Gait assistance: Min Gaffer (Feet): 100 Feet Assistive device: Rolling walker (2 wheeled) Gait Pattern/deviations: Step-to pattern;Decreased step length - right;Decreased step length - left;Decreased stance time - left;Decreased stride length;Decreased weight shift to left Gait velocity:  decreased   General Gait Details: overall steady with RW, no overt LOB or need for physical assistance, min guard for safety; pt attempting to increase knee flexion during swing through on L LE; cueing for increasing step length on R LE to progress towards a step-through gait pattern   Stairs             Wheelchair Mobility    Modified Rankin (Stroke Patients Only)       Balance Overall balance assessment: Needs assistance Sitting-balance support: Feet supported Sitting balance-Leahy Scale: Good     Standing balance support: During functional activity;Bilateral upper extremity supported;Single extremity supported Standing balance-Leahy Scale: Poor                              Cognition Arousal/Alertness: Awake/alert Behavior During Therapy: WFL for tasks assessed/performed Overall Cognitive Status: Within Functional Limits for tasks assessed                                        Exercises Total Joint Exercises Quad Sets: AROM;Strengthening;Left;10 reps;Supine Gluteal Sets: AROM;Strengthening;Both;10 reps;Supine Heel Slides: AROM;AAROM;Left;10 reps;Supine Straight Leg Raises: AAROM;Left;10 reps;Supine    General Comments        Pertinent Vitals/Pain Pain Assessment: Faces Faces Pain Scale: Hurts even more Pain Location: L knee Pain Descriptors / Indicators: Sore Pain Intervention(s): Monitored during session;Repositioned;Patient requesting pain meds-RN notified    Home Living  Prior Function            PT Goals (current goals can now be found in the care plan section) Acute Rehab PT Goals PT Goal Formulation: With patient Time For Goal Achievement: 05/14/19 Potential to Achieve Goals: Good Progress towards PT goals: Progressing toward goals    Frequency    7X/week      PT Plan Current plan remains appropriate    Co-evaluation              AM-PAC PT "6 Clicks" Mobility    Outcome Measure  Help needed turning from your back to your side while in a flat bed without using bedrails?: None Help needed moving from lying on your back to sitting on the side of a flat bed without using bedrails?: A Little Help needed moving to and from a bed to a chair (including a wheelchair)?: A Little Help needed standing up from a chair using your arms (e.g., wheelchair or bedside chair)?: A Little Help needed to walk in hospital room?: A Little Help needed climbing 3-5 steps with a railing? : A Little 6 Click Score: 19    End of Session   Activity Tolerance: Patient tolerated treatment well Patient left: in bed;with call bell/phone within reach Nurse Communication: Mobility status;Patient requests pain meds PT Visit Diagnosis: Pain;Muscle weakness (generalized) (M62.81) Pain - Right/Left: Left Pain - part of body: Knee     Time: [id number] PT Time Calculation (min) (ACUTE ONLY): 18 min  Charges:  $Gait Training: 8-22 mins                     Anastasio Champion, DPT  Acute Rehabilitation Services Pager [phone] Office [place] 05/01/2019, 3:04 PM

## 2019-05-01 NOTE — Progress Notes (Signed)
Physical Therapy Treatment Patient Details Name: Sharon [name] MRN: [id number] DOB: 08/28/1965 Today's Date: 05/01/2019    History of Present Illness Pt is a 53 y.o female s/p L TKA with significant PMH including OA, generalized anxiety disorder, HTN, cervicalgia and impingement of R shoulder.    PT Comments    Pt making good progress with functional mobility and tolerated ambulating a further distance into hallway today. Discussed plan for stair training at next session. Pt would continue to benefit from skilled physical therapy services at this time while admitted and after d/c to address the below listed limitations in order to improve overall safety and independence with functional mobility.    Follow Up Recommendations  Home health PT     Equipment Recommendations  Rolling walker with 5" wheels    Recommendations for Other Services       Precautions / Restrictions Precautions Precautions: Fall;Knee Precaution Comments: reviewed positioning of LE following knee sx with pt Restrictions Weight Bearing Restrictions: Yes LLE Weight Bearing: Weight bearing as tolerated    Mobility  Bed Mobility               General bed mobility comments: pt OOB on toilet upon arrival with RN present  Transfers Overall transfer level: Needs assistance Equipment used: Rolling walker (2 wheeled) Transfers: Sit to/from Stand Sit to Stand: Supervision         General transfer comment: for safety; good technique utilized  Ambulation/Gait Ambulation/Gait assistance: Min Gaffer (Feet): 75 Feet Assistive device: Rolling walker (2 wheeled) Gait Pattern/deviations: Step-to pattern;Decreased step length - right;Decreased step length - left;Decreased stance time - left;Decreased stride length;Decreased weight shift to left Gait velocity: decreased   General Gait Details: overall steady with RW, no overt LOB or need for physical assistance, min guard for  safety; pt attempting to increase knee flexion during swing through on L LE   Stairs             Wheelchair Mobility    Modified Rankin (Stroke Patients Only)       Balance Overall balance assessment: Needs assistance Sitting-balance support: Feet supported Sitting balance-Leahy Scale: Good     Standing balance support: Single extremity supported;Bilateral upper extremity supported Standing balance-Leahy Scale: Poor                              Cognition Arousal/Alertness: Awake/alert Behavior During Therapy: WFL for tasks assessed/performed Overall Cognitive Status: Within Functional Limits for tasks assessed                                        Exercises Total Joint Exercises Long Arc QuadSinclair Ship;Left;10 reps;Seated Knee Flexion: AAROM;Left;10 reps;Seated    General Comments        Pertinent Vitals/Pain Pain Assessment: 0-10 Pain Score: 5  Pain Location: L knee Pain Descriptors / Indicators: Sore Pain Intervention(s): Monitored during session;Repositioned;Premedicated before session    Home Living                      Prior Function            PT Goals (current goals can now be found in the care plan section) Acute Rehab PT Goals PT Goal Formulation: With patient Time For Goal Achievement: 05/14/19 Potential to Achieve Goals: Good Progress towards PT goals: Progressing  toward goals    Frequency    7X/week      PT Plan Current plan remains appropriate    Co-evaluation              AM-PAC PT "6 Clicks" Mobility   Outcome Measure  Help needed turning from your back to your side while in a flat bed without using bedrails?: None Help needed moving from lying on your back to sitting on the side of a flat bed without using bedrails?: A Little Help needed moving to and from a bed to a chair (including a wheelchair)?: A Little Help needed standing up from a chair using your arms (e.g., wheelchair  or bedside chair)?: A Little Help needed to walk in hospital room?: A Little Help needed climbing 3-5 steps with a railing? : A Little 6 Click Score: 19    End of Session Equipment Utilized During Treatment: Gait belt Activity Tolerance: Patient tolerated treatment well Patient left: in chair;with call bell/phone within reach Nurse Communication: Mobility status PT Visit Diagnosis: Pain;Muscle weakness (generalized) (M62.81) Pain - Right/Left: Left Pain - part of body: Knee     Time: [id number] PT Time Calculation (min) (ACUTE ONLY): 16 min  Charges:  $Gait Training: 8-22 mins                     Anastasio Champion, DPT  Acute Rehabilitation Services Pager [phone] Office [place] 05/01/2019, 10:42 AM

## 2019-05-01 NOTE — Evaluation (Signed)
Occupational Therapy Evaluation Patient Details Name: Sharon [name] MRN: [id number] DOB: 04-May-1966 Today's Date: 05/01/2019    History of Present Illness Pt is a 53 y.o female s/p L TKA with significant PMH including OA, generalized anxiety disorder, HTN, cervicalgia and impingement of R shoulder.   Clinical Impression   Pt admitted with the above diagnoses and presents with below problem list. Pt will benefit from continued acute OT to address the below listed deficits and maximize independence with basic ADLs prior to d/c home. At baseline, pt is independent with ADLs. Pt is currently setup to min guard with ADLs and functional transfers/mobility. Began ADL education regarding safe techniques with knee precautions.      Follow Up Recommendations  No OT follow up;Supervision - Intermittent(OOB/mobility)    Equipment Recommendations  3 in 1 bedside commode    Recommendations for Other Services       Precautions / Restrictions Precautions Precautions: Fall;Knee Precaution Comments: reviewed positioning of LE following knee sx with pt Restrictions Weight Bearing Restrictions: Yes LLE Weight Bearing: Weight bearing as tolerated      Mobility Bed Mobility Overal bed mobility: Needs Assistance Bed Mobility: Sit to Supine       Sit to supine: Min assist   General bed mobility comments: assist to advance LLE up onto bed  Transfers Overall transfer level: Needs assistance Equipment used: Rolling walker (2 wheeled) Transfers: Sit to/from Stand Sit to Stand: Supervision         General transfer comment: for safety; good technique utilized    Balance Overall balance assessment: Needs assistance Sitting-balance support: Feet supported Sitting balance-Leahy Scale: Good     Standing balance support: No upper extremity supported;Single extremity supported;Bilateral upper extremity supported Standing balance-Leahy Scale: Fair Standing balance comment: able to  stand to complete UB/LB bathing with intermittent single extremity assist. BUE support (rw) for mobility.                           ADL either performed or assessed with clinical judgement   ADL Overall ADL's : Needs assistance/impaired Eating/Feeding: Set up;Sitting   Grooming: Min guard;Standing   Upper Body Bathing: Set up;Min guard;Sitting;Standing   Lower Body Bathing: Min guard;Sit to/from stand   Upper Body Dressing : Set up;Min guard;Sitting;Standing   Lower Body Dressing: Min guard;Sit to/from stand   Toilet Transfer: Min guard;Ambulation;RW   Toileting- Water quality scientist and Hygiene: Min guard;Sit to/from stand   Tub/ Shower Transfer: Walk-in shower;Min guard;Ambulation;Shower seat;Rolling walker   Functional mobility during ADLs: Min guard;Rolling walker General ADL Comments: Pt completed toilet transfer, pericare, UB/LB bathing and bed mobility as detailed above. Began knee education regarding precautions and ADL techniques     Vision         Perception     Praxis      Pertinent Vitals/Pain Pain Assessment: Faces Pain Score: 5  Faces Pain Scale: Hurts even more Pain Location: L knee Pain Descriptors / Indicators: Sore Pain Intervention(s): Limited activity within patient's tolerance;Monitored during session;Repositioned;Ice applied     Hand Dominance     Extremity/Trunk Assessment Upper Extremity Assessment Upper Extremity Assessment: Overall WFL for tasks assessed   Lower Extremity Assessment Lower Extremity Assessment: Defer to PT evaluation   Cervical / Trunk Assessment Cervical / Trunk Assessment: Normal   Communication Communication Communication: No difficulties   Cognition Arousal/Alertness: Awake/alert Behavior During Therapy: WFL for tasks assessed/performed Overall Cognitive Status: Within Functional Limits for tasks assessed  General Comments       Exercises  Exercises: Total Joint Total Joint Exercises Long Arc Quad: AAROM;Left;10 reps;Seated Knee Flexion: AAROM;Left;10 reps;Seated   Shoulder Instructions      Home Living Family/patient expects to be discharged to:: Private residence Living Arrangements: Alone Available Help at Discharge: Friend(s);Available PRN/intermittently(boyfriend will assist at d/c per pt report) Type of Home: House Home Access: Stairs to enter [employer] of Steps: 2 STE Entrance Stairs-Rails: None Home Layout: One level     Bathroom Shower/Tub: Walk-in shower         Home Equipment: Shower seat          Prior Functioning/Environment Level of Independence: Independent                 OT Problem List: Impaired balance (sitting and/or standing);Decreased knowledge of use of DME or AE;Decreased knowledge of precautions;Pain      OT Treatment/Interventions: Self-care/ADL training;DME and/or AE instruction;Therapeutic activities;Patient/family education;Balance training    OT Goals(Current goals can be found in the care plan section) Acute Rehab OT Goals Patient Stated Goal: "get my knee to feel better" OT Goal Formulation: With patient Time For Goal Achievement: 05/08/19 Potential to Achieve Goals: Good ADL Goals Pt Will Perform Lower Body Dressing: with modified independence;sit to/from stand Pt Will Perform Tub/Shower Transfer: Shower transfer;with modified independence;ambulating;shower seat;rolling walker  OT Frequency: Min 2X/week   Barriers to D/C:            Co-evaluation              AM-PAC OT "6 Clicks" Daily Activity     Outcome Measure Help from another person eating meals?: None Help from another person taking care of personal grooming?: None Help from another person toileting, which includes using toliet, bedpan, or urinal?: None Help from another person bathing (including washing, rinsing, drying)?: A Little Help from another person to put on and taking  off regular upper body clothing?: None Help from another person to put on and taking off regular lower body clothing?: A Little 6 Click Score: 22   End of Session Equipment Utilized During Treatment: Rolling walker CPM Left Knee CPM Left Knee: Off  Activity Tolerance: Patient tolerated treatment well Patient left: in bed;with call bell/phone within reach  OT Visit Diagnosis: Unsteadiness on feet (R26.81);Pain Pain - Right/Left: Left Pain - part of body: Knee                Time: [id number] OT Time Calculation (min): 29 min Charges:  OT General Charges $OT Visit: 1 Visit OT Evaluation $OT Eval Low Complexity: 1 Low OT Treatments $Self Care/Home Management : 8-22 mins  Tyrone Schimke, OT Acute Rehabilitation Services Pager: [phone] Office: [phone]   Hortencia Pilar 05/01/2019, 11:07 AM

## 2019-05-02 ENCOUNTER — Encounter (HOSPITAL_COMMUNITY): Payer: Self-pay | Admitting: Orthopaedic Surgery

## 2019-05-02 ENCOUNTER — Telehealth: Payer: Self-pay | Admitting: Family Medicine

## 2019-05-02 NOTE — TOC Transition Note (Signed)
Transition of Care [facility]) - CM/SW Discharge Note   Patient Details  Name: Sharon [name] MRN: [id number] Date of Birth: Feb 26, 1966  Transition of Care [facility]) CM/SW Contact:  Midge Minium RN, BSN, NCM-BC, ACM-RN [phone] Phone Number: 05/02/2019, 9:03 AM   Clinical Narrative:    CM following for transitional needs. CM spoke to the patient to discuss the POC. Patient is a 53 y.o female s/p L TKA. Patient states living at home alone and being independent with her ADLs PTA. PCP/Demo verified. PT/OT eval completed with HH/DME recommended with the patient agreeable. HHPT was arranged with [facility] PTA; DME preference provided with no preference. DME referral given to Zack ([facility] liaison); AVS updated. Patient states her boyfriend will be available to provide assistance post-discharge. No further needs from CM.    Final next level of care: [place] Barriers to Discharge: No Barriers Identified   Patient Goals and CMS Choice Patient states their goals for this hospitalization and ongoing recovery are:: "Ready to go home" CMS Medicare.gov Compare Post Acute Care list provided to:: Patient Choice offered to / list presented to : Patient   Discharge Plan and Services                DME Arranged: Bedside commode, Walker rolling DME Agency: [facility] Date DME Agency Contacted: 05/02/19 Time DME Agency Contacted: 0901 Representative spoke with at DME Agency: [place] (liaison) [place] Arranged: PT [place]: [facility] [employer] [facility] [employer] Date [place]: 05/02/19 Time [place]: 0902 Representative spoke with at [place]: [place] (liaison)(Ortho physician arrived PTA)  Social Determinants of Health ([place]) Interventions     Readmission Risk Interventions No flowsheet data found.

## 2019-05-02 NOTE — Progress Notes (Signed)
Patient discharging home. Discharge instructions explained to patient and she verbalized understanding. Took all personal belongings. No further questions or concerns voiced.  

## 2019-05-02 NOTE — Telephone Encounter (Signed)
LVM to r/s appt for 07/24/2018 with Dr. Nolon Rod because she is going to be out of the office on that day

## 2019-05-02 NOTE — Discharge Summary (Signed)
Patient ID: Sharon [name] MRN: [id number] DOB/AGE: 07-05-1965 53 y.o.  Admit date: 04/29/2019 Discharge date: 05/02/2019  Admission Diagnoses:  Principal Problem:   Unilateral primary osteoarthritis, left knee Active Problems:   Status post total left knee replacement   Discharge Diagnoses:  Same  Past Medical History:  Diagnosis Date  . Allergy   . Anal fissure   . Anemia    borderline  . Anxiety   . Arthritis   . Blood clot in vein    left leg  . Detached retina    BOTH SCLERA BUCKLE BOTH EYES  . DVT (deep venous thrombosis) (HCC)    left leg  . Endometriosis   . Family history of adverse reaction to anesthesia    DAUGHTER POST OP PONV  . GERD (gastroesophageal reflux disease)   . Glaucoma    RIGHT WORST THAN LEFT  . Heart murmur    "caused by anxiety"   . History of hemorrhoids   . History of kidney stones   . Hypertension   . IBS (irritable bowel syndrome)   . Perforated eardrum, right    SMALL HOLE  . PONV (postoperative nausea and vomiting)   . Pulmonary embolism ([facility]) 6 YRS AGO    Surgeries: Procedure(s): LEFT TOTAL KNEE ARTHROPLASTY on 04/29/2019   Consultants:   Discharged Condition: Improved  Hospital Course: Sharon [name] is an 53 y.o. female who was admitted 04/29/2019 for operative treatment ofUnilateral primary osteoarthritis, left knee. Patient has severe unremitting pain that affects sleep, daily activities, and work/hobbies. After pre-op clearance the patient was taken to the operating room on 04/29/2019 and underwent  Procedure(s): LEFT TOTAL KNEE ARTHROPLASTY.    Patient was given perioperative antibiotics:  Anti-infectives (From admission, onward)   Start     Dose/Rate Route Frequency Ordered Stop   04/29/19 2200  acyclovir (ZOVIRAX) tablet 400 mg     400 mg Oral Daily at bedtime 04/29/19 1618     04/29/19 1800  clindamycin (CLEOCIN) IVPB 600 mg     600 mg 100 mL/hr over 30 Minutes Intravenous Every 6 hours 04/29/19 1618  04/30/19 0051   04/29/19 1000  clindamycin (CLEOCIN) IVPB 900 mg     900 mg 100 mL/hr over 30 Minutes Intravenous On call to O.R. 04/29/19 0956 04/29/19 1221       Patient was given sequential compression devices, early ambulation, and chemoprophylaxis to prevent DVT.  Patient benefited maximally from hospital stay and there were no complications.    Recent vital signs:  Patient Vitals for the past 24 hrs:  BP Temp Temp src Pulse Resp SpO2  05/01/19 2142 134/77 99.6 F (37.6 C) Oral 83 - 96 %  05/01/19 1348 116/63 99.5 F (37.5 C) Oral 86 16 97 %  05/01/19 0744 129/69 99.3 F (37.4 C) Oral 76 16 97 %     Recent laboratory studies:  Recent Labs    04/30/19 0414 05/01/19 0354  WBC 8.2 8.8  HGB 9.4* 8.9*  HCT 28.2* 27.1*  PLT 231 223  NA 136  --   K 4.0  --   CL 104  --   CO2 25  --   BUN 13  --   CREATININE 0.69  --   GLUCOSE 133*  --   CALCIUM 8.5*  --      Discharge Medications:   Allergies as of 05/02/2019      Reactions   Penicillins Other (See Comments)   Reaction unknown from childhood Has patient  had a PCN reaction causing immediate rash, facial/tongue/throat swelling, SOB or lightheadedness with hypotension: unknown Has patient had a PCN reaction causing severe rash involving mucus membranes or skin necrosis: unknown  Has patient had a PCN reaction that required hospitalization: no Has patient had a PCN reaction occurring within the last 10 years: no If all of the above answers are "NO", then may proceed with Cephalosporin use.   Dextromethorphan Polistirex Er Other (See Comments)   Pt states caused "a lump" in her throat, making it difficult to swallow   Diflucan [fluconazole]    Primidone interaction   Hydrocodone Nausea And Vomiting   Oxycodone-acetaminophen Other (See Comments)   Made fingers tingle and go numb, started "feeling weird".    Tape Itching, Other (See Comments)   Bandaids - leaves red marks      Medication List    TAKE these  medications   acetaminophen 500 MG tablet Commonly known as: TYLENOL Take 1,000 mg by mouth every 8 (eight) hours as needed for moderate pain.   acyclovir 400 MG tablet Commonly known as: ZOVIRAX Take 1 tablet (400 mg total) by mouth at bedtime.   aspirin 81 MG chewable tablet Chew 1 tablet (81 mg total) by mouth 2 (two) times daily.   celecoxib 200 MG capsule Commonly known as: CELEBREX Take 200 mg by mouth daily as needed for moderate pain.   clonazePAM 0.5 MG tablet Commonly known as: KLONOPIN Take 1 tablet (0.5 mg total) by mouth at bedtime.   EPINEPHrine 0.3 mg/0.3 mL Soaj injection Commonly known as: EPI-PEN Inject 0.3 mLs (0.3 mg total) into the muscle once as needed (For anaphylaxis.). What changed: reasons to take this   ibuprofen 200 MG tablet Commonly known as: ADVIL Take 400 mg by mouth 2 (two) times daily.   Magnesium 500 MG Caps Take 500 mg by mouth daily.   methocarbamol 500 MG tablet Commonly known as: ROBAXIN Take 1 tablet (500 mg total) by mouth every 6 (six) hours as needed for muscle spasms.   MIRALAX PO Take 17 g by mouth daily as needed (constipation).   Multi-Vitamins Tabs Take 1 tablet by mouth daily.   nebivolol 5 MG tablet Commonly known as: Bystolic Take 1 tablet (5 mg total) by mouth daily. What changed: when to take this   Nitroglycerin 0.4 % Oint Commonly known as: Rectiv Place 0.4 inches rectally 2 (two) times daily as needed (For anal fissures.).   omeprazole 20 MG capsule Commonly known as: PRILOSEC Take 1 capsule (20 mg total) by mouth daily. What changed: when to take this   ondansetron 4 MG disintegrating tablet Commonly known as: Zofran ODT Take 1 tablet (4 mg total) by mouth every 8 (eight) hours as needed for nausea or vomiting.   ondansetron 8 MG tablet Commonly known as: ZOFRAN Take 1 tablet (8 mg total) by mouth every 8 (eight) hours as needed for nausea or vomiting.   oxyCODONE 5 MG immediate release  tablet Commonly known as: Oxy IR/ROXICODONE Take 1-2 tablets (5-10 mg total) by mouth every 4 (four) hours as needed for moderate pain (pain score 4-6).   primidone 50 MG tablet Commonly known as: MYSOLINE Take 3 tablets (150 mg total) by mouth daily.   senna-docusate 8.6-50 MG tablet Commonly known as: Senokot-S Take 1 tablet by mouth daily as needed for moderate constipation.   Travoprost (BAK Free) 0.004 % Soln ophthalmic solution Commonly known as: TRAVATAN INSTILL 1 DROP INTO BOTH EYES AT BEDTIME What changed:  when to take this  additional instructions   vitamin B-12 1000 MCG tablet Commonly known as: CYANOCOBALAMIN Take 1 tablet (1,000 mcg total) by mouth daily.   vitamin C 1000 MG tablet Take 1,000 mg by mouth daily.   Vitamin D 50 MCG (2000 UT) tablet Take 2,000 Units by mouth daily.   zinc gluconate 50 MG tablet Take 50 mg by mouth daily.            Durable Medical Equipment  (From admission, onward)         Start     Ordered   04/29/19 1618  DME 3 n 1  Once     04/29/19 1618   04/29/19 1618  DME Walker rolling  Once    Question:  Patient needs a walker to treat with the following condition  Answer:  Status post total left knee replacement   04/29/19 1618          Diagnostic Studies: Dg Knee 2 Views Left  Result Date: 04/29/2019 CLINICAL DATA:  Total left knee replacement. EXAM: LEFT KNEE - 1-2 VIEW COMPARISON:  No prior. FINDINGS: Total left knee replacement. Hardware intact. Anatomic alignment. No acute bony abnormality. IMPRESSION: Total left knee replacement. Anatomic alignment. No acute abnormality. Electronically Signed   By: [place]   On: 04/29/2019 14:50    Disposition: Discharge disposition: 01-Home or [place]    Mcarthur Rossetti, MD Follow up in 2 week(s).   Specialty: Orthopedic Surgery Why: Our office has moved next door. Our new address is [address]  information: [place] [address] [phone]            Signed: Mcarthur Rossetti 05/02/2019, 6:36 AM

## 2019-05-02 NOTE — Progress Notes (Signed)
Occupational Therapy Treatment Patient Details Name: Sharon [name] MRN: [id number] DOB: June 07, 1966 Today's Date: 05/02/2019    History of present illness Pt is a 53 y.o female s/p L TKA with significant PMH including OA, generalized anxiety disorder, HTN, cervicalgia and impingement of R shoulder.   OT comments  Pt making good progress towards OT goals this session. Pt complete UB ADL with supervision; LB ADL with MOD A to assist with donning tennis shoes. Educated pt on LB AE with pt politely declining use. Pt reports seat in her walkin shower. Visual demo'ed use of 3n1 as shower seat if needed with pt verbalizing understanding. Pt complete functional mobility to<>from bathroom with RW and supervision. Pt perform all toileting tasks with supervision for safety as well as standing grooming. Pt likely to DC home today with boyfriend and no OT f/u. Will continue to follow acutely for OT needs.    Follow Up Recommendations  No OT follow up;Supervision - Intermittent    Equipment Recommendations  3 in 1 bedside commode    Recommendations for Other Services      Precautions / Restrictions Precautions Precautions: Fall;Knee Restrictions Weight Bearing Restrictions: No LLE Weight Bearing: Weight bearing as tolerated       Mobility Bed Mobility Overal bed mobility: Needs Assistance Bed Mobility: Supine to Sit;Sit to Supine     Supine to sit: Supervision Sit to supine: Supervision   General bed mobility comments: supervision for safety; use of R LE to assist with L LE movement onto and off of bed  Transfers Overall transfer level: Needs assistance Equipment used: Rolling walker (2 wheeled) Transfers: Sit to/from Stand Sit to Stand: Supervision         General transfer comment: for safety; good technique utilized    Balance Overall balance assessment: Needs assistance Sitting-balance support: Feet supported Sitting balance-Leahy Scale: Good Sitting balance -  Comments: WFL; able to complete LB dressing with no LOB   Standing balance support: During functional activity Standing balance-Leahy Scale: Fair Standing balance comment: able to reach items on floor with no LOB; completed functional grooming at sink with no UE support                           ADL either performed or assessed with clinical judgement   ADL Overall ADL's : Needs assistance/impaired     Grooming: Wash/dry hands;Standing;Supervision/safety           Upper Body Dressing : Supervision/safety;Sitting   Lower Body Dressing: Moderate assistance;Supervision/safety;Cueing for compensatory techniques;Sit to/from stand Lower Body Dressing Details (indicate cue type and reason): cues to don LLE first when donning pants; demo'ed use of sock aid with pt stating she would prefer her bf help her with LB dressing; MOD A to don L shoe Toilet Transfer: Supervision/safety;Ambulation;RW;Regular Museum/gallery exhibitions officer and Hygiene: Sit to/from Social worker Details (indicate cue type and reason): reports seat in walk in shower, education on using 3n1 in shower if needed Functional mobility during ADLs: Supervision/safety;Rolling walker General ADL Comments: pt completed UB dressing with supervision, LB dressing with supervision- MOD A, functional mobility with RW to<>from toilet with RW, good safety awareness throuhgout session, education on LB AE with stating she feels like she will be fine without AE     Vision Baseline Vision/History: No visual deficits     Perception     Praxis      Cognition Arousal/Alertness: Awake/alert Behavior  During Therapy: WFL for tasks assessed/performed Overall Cognitive Status: Within Functional Limits for tasks assessed                                 General Comments: very pleasant and friendly        Exercises     Shoulder Instructions       General  Comments      Pertinent Vitals/ Pain       Pain Assessment: No/denies pain  Home Living                                          Prior Functioning/Environment              Frequency  Min 2X/week        Progress Toward Goals  OT Goals(current goals can now be found in the care plan section)  Progress towards OT goals: Progressing toward goals  Acute Rehab OT Goals Patient Stated Goal: "get my knee to feel better" OT Goal Formulation: With patient Time For Goal Achievement: 05/08/19 Potential to Achieve Goals: Good  Plan Discharge plan remains appropriate    Co-evaluation                 AM-PAC OT "6 Clicks" Daily Activity     Outcome Measure   Help from another person eating meals?: None Help from another person taking care of personal grooming?: None Help from another person toileting, which includes using toliet, bedpan, or urinal?: None Help from another person bathing (including washing, rinsing, drying)?: A Little Help from another person to put on and taking off regular upper body clothing?: None Help from another person to put on and taking off regular lower body clothing?: A Little 6 Click Score: 22    End of Session Equipment Utilized During Treatment: Rolling walker  OT Visit Diagnosis: Unsteadiness on feet (R26.81);Pain Pain - Right/Left: Left Pain - part of body: Knee   Activity Tolerance Patient tolerated treatment well   Patient Left in bed;with call bell/phone within reach   Nurse Communication Mobility status;Other (comment)(pt dressed)        Time: [id number] OT Time Calculation (min): 28 min  Charges: OT General Charges $OT Visit: 1 Visit OT Treatments $Self Care/Home Management : 23-37 mins  Lanier Clam., COTA/L Acute Rehabilitation Services [phone] [place] 05/02/2019, 2:23 PM

## 2019-05-02 NOTE — Progress Notes (Signed)
Patient ID: Ulice Brilliant, female   DOB: 06/09/1966, 53 y.o.   MRN: [id number] Looks good this am.  Can be discharged to home today.

## 2019-05-02 NOTE — Progress Notes (Signed)
Physical Therapy Treatment Patient Details Name: Sharon [name] MRN: [id number] DOB: 1965/09/19 Today's Date: 05/02/2019    History of Present Illness Pt is a 53 y.o female s/p L TKA with significant PMH including OA, generalized anxiety disorder, HTN, cervicalgia and impingement of R shoulder.    PT Comments    Pt making steady progress with functional mobility. She was able to participate in stair training this session without difficulties. Plan is for pt to d/c home today with boyfriend. Pt is ready for d/c home from PT perspective.   Follow Up Recommendations  Home health PT (quick progression to OPPT)     Equipment Recommendations  Rolling walker with 5" wheels    Recommendations for Other Services       Precautions / Restrictions Precautions Precautions: Fall;Knee Precaution Comments: reviewed positioning of LE following knee sx with pt Restrictions Weight Bearing Restrictions: Yes LLE Weight Bearing: Weight bearing as tolerated    Mobility  Bed Mobility Overal bed mobility: Needs Assistance Bed Mobility: Supine to Sit;Sit to Supine     Supine to sit: Supervision Sit to supine: Supervision   General bed mobility comments: supervision for safety; use of R LE to assist with L LE movement onto and off of bed  Transfers Overall transfer level: Needs assistance Equipment used: Rolling walker (2 wheeled) Transfers: Sit to/from Stand Sit to Stand: Supervision         General transfer comment: for safety; good technique utilized  Ambulation/Gait Ambulation/Gait assistance: Supervision Gait Distance (Feet): 120 Feet Assistive device: Rolling walker (2 wheeled) Gait Pattern/deviations: Step-to pattern;Decreased step length - right;Decreased step length - left;Decreased stance time - left;Decreased stride length;Decreased weight shift to left Gait velocity: decreased   General Gait Details: overall steady with RW, no overt LOB or need for physical  assistance, min guard for safety; pt attempting to increase knee flexion during swing through on L LE; cueing for increasing step length on R LE to progress towards a step-through gait pattern   Stairs Stairs: Yes Stairs assistance: Min guard;Min assist Stair Management: Two rails;Step to pattern;Forwards;No rails;With walker Number of Stairs: 2(x2 trials) General stair comments: first trial using bilateral UEs on hand rails with min guard and cueing for sequencing; second trial going up backwards with RW and min A for stabilization of RW - PT provided cueing and demonstration   Wheelchair Mobility    Modified Rankin (Stroke Patients Only)       Balance Overall balance assessment: Needs assistance Sitting-balance support: Feet supported Sitting balance-Leahy Scale: Good     Standing balance support: During functional activity Standing balance-Leahy Scale: Fair                              Cognition Arousal/Alertness: Awake/alert Behavior During Therapy: WFL for tasks assessed/performed Overall Cognitive Status: Within Functional Limits for tasks assessed                                        Exercises      General Comments        Pertinent Vitals/Pain Pain Assessment: Faces Faces Pain Scale: Hurts little more Pain Location: L knee Pain Descriptors / Indicators: Sore Pain Intervention(s): Monitored during session;Repositioned;Patient requesting pain meds-RN notified    Home Living  Prior Function            PT Goals (current goals can now be found in the care plan section) Acute Rehab PT Goals PT Goal Formulation: With patient Time For Goal Achievement: 05/14/19 Potential to Achieve Goals: Good Progress towards PT goals: Progressing toward goals    Frequency    7X/week      PT Plan Current plan remains appropriate    Co-evaluation              AM-PAC PT "6 Clicks" Mobility    Outcome Measure  Help needed turning from your back to your side while in a flat bed without using bedrails?: None Help needed moving from lying on your back to sitting on the side of a flat bed without using bedrails?: A Little Help needed moving to and from a bed to a chair (including a wheelchair)?: A Little Help needed standing up from a chair using your arms (e.g., wheelchair or bedside chair)?: A Little Help needed to walk in hospital room?: A Little Help needed climbing 3-5 steps with a railing? : A Little 6 Click Score: 19    End of Session Equipment Utilized During Treatment: Gait belt Activity Tolerance: Patient tolerated treatment well Patient left: in bed;with call bell/phone within reach Nurse Communication: Mobility status;Patient requests pain meds PT Visit Diagnosis: Pain;Muscle weakness (generalized) (M62.81) Pain - Right/Left: Left Pain - part of body: Knee     Time: [id number] PT Time Calculation (min) (ACUTE ONLY): 21 min  Charges:  $Gait Training: 8-22 mins                     Anastasio Champion, DPT  Acute Rehabilitation Services Pager [phone] Office [place] 05/02/2019, 10:32 AM

## 2019-05-02 NOTE — Anesthesia Postprocedure Evaluation (Signed)
Anesthesia Post Note  Patient: Sharon [name]  Procedure(s) Performed: LEFT TOTAL KNEE ARTHROPLASTY (Left Knee)     Patient location during evaluation: PACU Anesthesia Type: Regional, Spinal and MAC Level of consciousness: awake and alert Pain management: pain level controlled Vital Signs Assessment: post-procedure vital signs reviewed and stable Respiratory status: spontaneous breathing, nonlabored ventilation, respiratory function stable and patient connected to nasal cannula oxygen Cardiovascular status: stable and blood pressure returned to baseline Postop Assessment: no apparent nausea or vomiting and spinal receding Anesthetic complications: no    Last Vitals:  Vitals:   05/02/19 0511 05/02/19 0749  BP: 114/71 (!) 100/59  Pulse: 78 85  Resp:  18  Temp: 36.7 C 37.3 C  SpO2: 97% 100%    Last Pain:  Vitals:   05/02/19 0830  TempSrc:   PainSc: 4                  Sher Hellinger

## 2019-05-05 ENCOUNTER — Telehealth: Payer: Self-pay | Admitting: Orthopaedic Surgery

## 2019-05-05 MED ORDER — OXYCODONE HCL 5 MG PO TABS: 5.0000 mg | ORAL_TABLET | ORAL | 0 refills | Status: DC | PRN

## 2019-05-05 MED ORDER — ONDANSETRON HCL 8 MG PO TABS: 8.0000 mg | ORAL_TABLET | Freq: Three times a day (TID) | ORAL | 1 refills | Status: DC | PRN

## 2019-05-05 MED ORDER — METHOCARBAMOL 500 MG PO TABS: 500.0000 mg | ORAL_TABLET | Freq: Four times a day (QID) | ORAL | 1 refills | Status: DC | PRN

## 2019-05-05 NOTE — Telephone Encounter (Signed)
Patient called left voicemail message needing Rx refilled (Hydrocodone,Ondansetron and Methocarbamol) Patient said she starts therapy today. The number to contact patient is [phone]

## 2019-05-05 NOTE — Telephone Encounter (Signed)
I sent in oxycodone, Zofran and Robaxin to her pharmacy today.  I did discharge her just this past Friday and had those medicines sent in at that time as well.

## 2019-05-05 NOTE — Telephone Encounter (Signed)
Please advise 

## 2019-05-09 ENCOUNTER — Other Ambulatory Visit: Payer: Self-pay | Admitting: Orthopaedic Surgery

## 2019-05-09 ENCOUNTER — Telehealth: Payer: Self-pay | Admitting: Orthopaedic Surgery

## 2019-05-09 MED ORDER — OXYCODONE HCL 5 MG PO TABS: 5.0000 mg | ORAL_TABLET | ORAL | 0 refills | Status: DC | PRN

## 2019-05-09 NOTE — Telephone Encounter (Signed)
Patient called this morning requesting an RX refill on her Oxycodone.  [facility] [address] in [place].  Patient also stated that she only has enough pills to last her to tonight.  CB#[phone].  Thank you.

## 2019-05-09 NOTE — Telephone Encounter (Signed)
Patient called stating that the pharmacy has not received her RX.  Can this be sent again?  CB#[phone].  Thank you.

## 2019-05-09 NOTE — Telephone Encounter (Signed)
Patient aware this was called in  

## 2019-05-09 NOTE — Telephone Encounter (Signed)
She still need this?

## 2019-05-09 NOTE — Telephone Encounter (Signed)
Please advise 

## 2019-05-13 ENCOUNTER — Telehealth: Payer: Self-pay | Admitting: Orthopaedic Surgery

## 2019-05-13 ENCOUNTER — Inpatient Hospital Stay: Payer: PRIVATE HEALTH INSURANCE | Admitting: Orthopaedic Surgery

## 2019-05-13 MED ORDER — OXYCODONE HCL 5 MG PO TABS: 5.0000 mg | ORAL_TABLET | ORAL | 0 refills | Status: DC | PRN

## 2019-05-13 NOTE — Telephone Encounter (Signed)
Patient called needing Rx refilled (Oxycodone) The number to contact patient is [phone]

## 2019-05-13 NOTE — Telephone Encounter (Signed)
Please advise 

## 2019-05-14 ENCOUNTER — Other Ambulatory Visit: Payer: Self-pay

## 2019-05-14 ENCOUNTER — Encounter: Payer: Self-pay | Admitting: Orthopaedic Surgery

## 2019-05-14 ENCOUNTER — Ambulatory Visit (INDEPENDENT_AMBULATORY_CARE_PROVIDER_SITE_OTHER): Payer: PRIVATE HEALTH INSURANCE | Admitting: Orthopaedic Surgery

## 2019-05-14 DIAGNOSIS — Z96652 Presence of left artificial knee joint: Secondary | ICD-10-CM

## 2019-05-14 MED ORDER — CELECOXIB 200 MG PO CAPS: 200.0000 mg | ORAL_CAPSULE | Freq: Two times a day (BID) | ORAL | 1 refills | Status: DC

## 2019-05-14 MED ORDER — OXYCODONE HCL 5 MG PO TABS: 5.0000 mg | ORAL_TABLET | ORAL | 0 refills | Status: DC | PRN

## 2019-05-14 NOTE — Progress Notes (Signed)
The patient is now 2 weeks status post a left total knee arthroplasty.  She is only 53 years old.  She is working with home therapy.  She has not been able to flex her knee to about 60 to 70 degrees.  She feels like this is related to her bandage and staples.  On exam her incision looks good to remove the staples in place Steri-Strips.  She does have moderate swelling in her knee to be expected postoperative.  Her calf is soft and her foot is not swollen.  She lacks full extension by a few degrees and I can only flex her to about 65 degrees.  I stressed the importance of physical therapy.  She is still taking oxycodone and I am having to refill this pretty regularly.  She is taking muscle relaxants.  Now that I will have her stop taking her aspirin I will send in some Celebrex for her to take twice a day.  She will at least have home therapy for another week or so and have stressed the importance again of outpatient physical therapy and transitioning to that here at current [facility] starting seen.  We will work on scheduling that.  I did send in some more oxycodone which she cannot probably refill until the end of the week, but since there is a holiday I felt comfortable sending something for her.  She is scheduled to resume work after short-term disability starting December 15.  However, due to the need of aggressive physical therapy for her knee the fact that she is still taking narcotics I am not comfortable with releasing her to any type of work or even driving for likely the next 6 to 8 weeks.  She will need her short-term disability changed to reflect that she, in my medical opinion, will be unable to return to any type of work until the end of January or 1 February.  I will see her back in 4 weeks to see how she is doing overall but no x-rays are needed.

## 2019-05-21 ENCOUNTER — Telehealth: Payer: Self-pay | Admitting: Orthopaedic Surgery

## 2019-05-21 ENCOUNTER — Other Ambulatory Visit: Payer: Self-pay | Admitting: Orthopaedic Surgery

## 2019-05-21 MED ORDER — OXYCODONE HCL 5 MG PO TABS: 5.0000 mg | ORAL_TABLET | ORAL | 0 refills | Status: DC | PRN

## 2019-05-21 NOTE — Telephone Encounter (Signed)
Please advise 

## 2019-05-21 NOTE — Telephone Encounter (Signed)
Patient called. Says her last in home PT will be Friday. She would like a referral for PT outpatient. She also would need a refill the weekend for Oxycodone. Her call back number is [phone]

## 2019-05-21 NOTE — Telephone Encounter (Signed)
I sent in some more oxycodone for her.  Have her try to use this sparingly.  Please set her up for outpatient physical therapy which can be done at any of the kind facilities including are placed.  This will be for therapy status post a right total knee arthroplasty.

## 2019-05-27 ENCOUNTER — Other Ambulatory Visit: Payer: Self-pay | Admitting: Radiology

## 2019-05-27 ENCOUNTER — Other Ambulatory Visit: Payer: Self-pay | Admitting: Orthopaedic Surgery

## 2019-05-27 DIAGNOSIS — Z96652 Presence of left artificial knee joint: Secondary | ICD-10-CM

## 2019-05-27 MED ORDER — OXYCODONE HCL 5 MG PO TABS: 5.0000 mg | ORAL_TABLET | Freq: Four times a day (QID) | ORAL | 0 refills | Status: DC | PRN

## 2019-05-27 NOTE — Telephone Encounter (Signed)
Patient called in requesting refill for oxycodone at [facility].  Patient also is requesting a call  back about Physical Therapy session. Patient stated to have been approved but hasn't received call for outpatient sessions. Patient phone number is [phone].

## 2019-05-27 NOTE — Telephone Encounter (Signed)
Please advise 

## 2019-05-27 NOTE — Telephone Encounter (Signed)
Order placed for PT - our PT scheduling should be contacting patient soon.

## 2019-05-29 ENCOUNTER — Encounter: Payer: Self-pay | Admitting: Physical Therapy

## 2019-05-29 ENCOUNTER — Ambulatory Visit (INDEPENDENT_AMBULATORY_CARE_PROVIDER_SITE_OTHER): Payer: PRIVATE HEALTH INSURANCE | Admitting: Physical Therapy

## 2019-05-29 ENCOUNTER — Other Ambulatory Visit: Payer: Self-pay

## 2019-05-29 DIAGNOSIS — R293 Abnormal posture: Secondary | ICD-10-CM | POA: Diagnosis not present

## 2019-05-29 DIAGNOSIS — M25662 Stiffness of left knee, not elsewhere classified: Secondary | ICD-10-CM

## 2019-05-29 DIAGNOSIS — R2681 Unsteadiness on feet: Secondary | ICD-10-CM

## 2019-05-29 DIAGNOSIS — R600 Localized edema: Secondary | ICD-10-CM

## 2019-05-29 DIAGNOSIS — R2689 Other abnormalities of gait and mobility: Secondary | ICD-10-CM | POA: Diagnosis not present

## 2019-05-29 DIAGNOSIS — M6281 Muscle weakness (generalized): Secondary | ICD-10-CM

## 2019-05-29 DIAGNOSIS — G8929 Other chronic pain: Secondary | ICD-10-CM

## 2019-05-29 DIAGNOSIS — M25562 Pain in left knee: Secondary | ICD-10-CM

## 2019-05-29 NOTE — Therapy (Signed)
[facility] Physical Therapy [address] [place], [place], [address] Phone: [phone]   Fax:  [phone]  Physical Therapy Evaluation  Patient Details  Name: Sharon [name] MRN: [id number] Date of Birth: [birth year] Referring Provider (PT): Jean Rosenthal, MD   Encounter Date: 05/29/2019  PT End of Session - 05/29/19 1629    Visit Number  1    Number of Visits  19    Date for PT Re-Evaluation  07/11/19    Authorization Type  Medcost    Authorization Time Period  MET deductible,  100% covered for 2020    PT Start Time  1446    PT Stop Time  1520    PT Time Calculation (min)  34 min    Activity Tolerance  Patient limited by pain;Patient tolerated treatment well   Pt took pain medications prior to PT to avoid pain. She reports concerns with tolerating if MD does not renew while in PT.   Behavior During Therapy  [facility] for tasks assessed/performed       Past Medical History:  Diagnosis Date  . Allergy   . Anal fissure   . Anemia    borderline  . Anxiety   . Arthritis   . Blood clot in vein    left leg  . Detached retina    BOTH SCLERA BUCKLE BOTH EYES  . DVT (deep venous thrombosis) (HCC)    left leg  . Endometriosis   . Family history of adverse reaction to anesthesia    DAUGHTER POST OP PONV  . GERD (gastroesophageal reflux disease)   . Glaucoma    RIGHT WORST THAN LEFT  . Heart murmur    "caused by anxiety"   . History of hemorrhoids   . History of kidney stones   . Hypertension   . IBS (irritable bowel syndrome)   . Perforated eardrum, right    SMALL HOLE  . PONV (postoperative nausea and vomiting)   . Pulmonary embolism ([place]) 6 YRS AGO    Past Surgical History:  Procedure Laterality Date  . ABDOMINAL HYSTERECTOMY    . CATARACT EXTRACTION Bilateral 2015  . COLONOSCOPY    . CYSTOSCOPY W/ URETERAL STENT PLACEMENT Right 05/23/2017   Procedure: CYSTOSCOPY WITH RETROGRADE PYELOGRAM/URETERAL RIGHT STENT PLACEMENT;  Surgeon:  Bjorn Loser, MD;  Location: [facility];  Service: Urology;  Laterality: Right;  . CYSTOSCOPY W/ URETERAL STENT PLACEMENT Right 06/04/2017   Procedure: CYSTOSCOPY WITHRIGHT RETROGRADE PYELOGRAMRIGHT /URETEREOSCOPY  STENT PLACEMENT;  Surgeon: Lucas Mallow, MD;  Location: [facility];  Service: Urology;  Laterality: Right;  . ENDOMETRIAL ABLATION    . EYE SURGERY Bilateral    cataract surgery with lens implants  . [place]  2011  . INCONTINENCE SURGERY    . INNER EAR SURGERY     X 2  . KNEE SURGERY    . RECTAL PROLAPSE REPAIR    . RETINAL DETACHMENT SURGERY Bilateral 2000  . RETINAL DETACHMENT SURGERY Bilateral   . TOTAL KNEE ARTHROPLASTY Left 04/29/2019  . TOTAL KNEE ARTHROPLASTY Left 04/29/2019   Procedure: LEFT TOTAL KNEE ARTHROPLASTY;  Surgeon: Mcarthur Rossetti, MD;  Location: [facility];  Service: Orthopedics;  Laterality: Left;  . TUBAL LIGATION    . VEIN SURGERY Left 04/2010    There were no vitals filed for this visit.   Subjective Assessment - 05/29/19 1453    Subjective  This 53yo female was referred for PT on 05/27/2019 by Jean Rosenthal, MD s/p left  Total Knee Replacement performed on 04/29/2019. She has history of OA.    Pertinent History  right shoulder impingement, cervicalgia, right Trochanteric bursitis, dyspnea, HTN,    Patient Stated Goals  To walk without cane or pain.    Currently in Pain?  Yes    Pain Score  6    in last week, worst 10/10, best 2/10   Pain Location  Knee    Pain Orientation  Left;Distal;Anterior    Pain Descriptors / Indicators  Burning;Shooting;Tightness    Pain Type  Surgical pain    Pain Onset  More than a month ago    Pain Frequency  Constant    Aggravating Factors   bending knee, sitting too long,    Pain Relieving Factors  medications, touching leg    Effect of Pain on Daily Activities  limiting knee flexion except when exercises         [facility] PT Assessment - 05/29/19 1445      Assessment    Medical Diagnosis  Left TKR    Referring Provider (PT)  Jean Rosenthal, MD    Onset Date/Surgical Date  04/29/19    Hand Dominance  Right      Precautions   Precautions  Fall      Restrictions   Weight Bearing Restrictions  No      Balance Screen   Has the patient fallen in the past 6 months  No    Has the patient had a decrease in activity level because of a fear of falling?   No    Is the patient reluctant to leave their home because of a fear of falling?   No      Home Environment   Living Environment  Private residence    Living Arrangements  Alone;Spouse/significant other   boyfriend stays over often   Type of [place] to enter    Entrance Stairs-Number of Steps  3    Entrance Stairs-Rails  Cannot reach both    [place]  One level   single step into laundry room   [place] - single point;Walker - 2 wheels;Crutches;Shower seat      Prior Function   Level of Independence  Independent;Independent with household mobility without device;Independent with community mobility without device    Vocation  Full time employment    Vocation Requirements  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, lift packages delivered, stand up & sit down frequently      Functional Tests   Functional tests  Single leg stance      Single Leg Stance   Comments  SLS LLE 2 sec 1st attempt & 3 sec 2nd attempt   RLE 9 sec 1st attempt  11 sec 2nd attempt      Posture/Postural Control   Posture/Postural Control  Postural limitations    Postural Limitations  Rounded Shoulders;Forward head;Weight shift right      AROM   Overall AROM   Deficits    AROM Assessment Site  Knee    Left Knee Extension  -17   seated   Left Knee Flexion  56   seated 56*, prone 38*     PROM   Overall PROM   Deficits    Left Knee Extension  -14   supine & prone   Left Knee Flexion  68   PT max range 68*,  pt max range 66*     Strength  Overall Strength  Deficits    Overall Strength  Comments  right Hip functions ~4/5, ankle PF grossly 4/5 hand tested <3-/5 standing test    Strength Assessment Site  Knee    Left Knee Flexion  3-/5    Left Knee Extension  3-/5   no change in ROM with SLR (no quad lag)     Transfers   Transfers  Sit to Stand;Stand to Sit    Sit to Stand  6: Modified independent (Device/Increase time);Without upper extremity assist;From chair/3-in-1   increased time   Stand to Sit  6: Modified independent (Device/Increase time);With upper extremity assist;To chair/3-in-1      Ambulation/Gait   Ambulation/Gait  Yes    Ambulation/Gait Assistance  5: Supervision    Ambulation Distance (Feet)  100 Feet    Assistive device  Straight cane    Gait Pattern  Step-through pattern;Decreased arm swing - left;Decreased step length - right;Decreased stance time - left;Decreased hip/knee flexion - left;Decreased weight shift to left;Left hip hike;Left flexed knee in stance;Antalgic    Ambulation Surface  Indoor;Level    Gait velocity  1.96 ft/sec      Standardized Balance Assessment   Standardized Balance Assessment  Five Times Sit to Stand    Five times sit to stand comments   19.97 sec                Objective measurements completed on examination: See above findings.                   PT Long Term Goals - 05/29/19 1928      PT LONG TERM GOAL #1   Title  PROM Left knee flexion 90*    Time  6    Period  Weeks    Status  New    Target Date  07/11/19      PT LONG TERM GOAL #2   Title  PROM Left knee extension -10    Time  6    Period  Weeks    Status  New    Target Date  07/11/19      PT LONG TERM GOAL #3   Title  Single leg stance LLE >/= 5 sec    Time  6    Period  Weeks    Status  New    Target Date  07/11/19      PT LONG TERM GOAL #4   Title  Gait Velocity >2.62 ft/sec to indicate community mobility    Time  6    Period  Weeks    Status  New    Target Date  07/11/19      PT LONG TERM GOAL #5   Title  Patient  ambulates >400' without assistive device modified independent.    Time  6    Period  Weeks    Status  New    Target Date  07/11/19      PT LONG TERM GOAL #6   Title  Patient negotiates stairs with single rail, ramps & curbs without assistive device modified independent.    Time  6    Period  Weeks    Status  New    Target Date  07/11/19      PT LONG TERM GOAL #7   Title  Patient reports left knee pain </= 6/10 without prescription pain medications (using only over the counter meds)    Time  6    Period  Weeks  Status  New    Target Date  07/11/19             Plan - 05/29/19 1916    Clinical Impression Statement  This 53yo female underwent a left Total Knee Replacement 4 weeks ago. She has limited range of motion passively and actively of left knee. Knee flexion passively 68* seated and actively 56* seated & 38* prone. She has decreased strength of left knee, hip & ankle. Her gait is impaired with significant antalgic pattern.  Single leg stance 2 seconds on Left leg.  Patient reports pain up to 10/10. She took medication prior to PT evaluation so rated 6/10. Patient has localized edema of left knee. Patient would benefit from skilled PT to improve range, strength and function.    Personal Factors and Comorbidities  Comorbidity 3+;Fitness;Past/Current Experience    Comorbidities  right shoulder impingement, cervicalgia, right Trochanteric bursitis, dyspnea, HTN    Examination-Activity Limitations  Bend;Lift;Locomotion Level;Stand;Stairs;Transfers    Examination-Participation Restrictions  Community Activity    Stability/Clinical Decision Making  Stable/Uncomplicated    Clinical Decision Making  Low    Rehab Potential  Good    PT Frequency  3x / week    PT Duration  6 weeks    PT Treatment/Interventions  ADLs/Self Care Home Management;Electrical Stimulation;Moist Heat;DME Instruction;Gait training;Stair training;Functional mobility training;Therapeutic activities;Therapeutic  exercise;Balance training;Neuromuscular re-education;Patient/family education;Manual techniques;Scar mobilization;Compression bandaging;Passive range of motion;Dry needling;Taping;Vasopneumatic Device;Joint Manipulations    PT Next Visit Plan  HEP to work on ROM, manual techniques to increase range, vasopneumatic device.    Consulted and Agree with Plan of Care  Patient       Patient will benefit from skilled therapeutic intervention in order to improve the following deficits and impairments:  Abnormal gait, Decreased activity tolerance, Decreased balance, Decreased endurance, Decreased mobility, Decreased range of motion, Decreased skin integrity, Decreased scar mobility, Decreased strength, Increased edema, Impaired flexibility, Postural dysfunction, Pain  Visit Diagnosis: Other abnormalities of gait and mobility  Unsteadiness on feet  Abnormal posture  Muscle weakness  Stiffness of left knee, not elsewhere classified  Localized edema  Chronic pain of left knee     Problem List Patient Active Problem List   Diagnosis Date Noted  . Status post total left knee replacement 04/29/2019  . Impingement syndrome of right shoulder 10/28/2018  . Chronic pain of left knee 03/27/2018  . Chronic right shoulder pain 03/27/2018  . Cervicalgia 03/27/2018  . Status post arthroscopy of left knee 08/06/2017  . Unilateral primary osteoarthritis, left knee 08/06/2017  . Tremor 07/10/2017  . Other meniscus derangements, posterior horn of medial meniscus, left knee 06/28/2017  . Ureteral stone 05/23/2017  . History of sepsis 05/23/2017  . Acute pain of left knee 04/09/2017  . Trochanteric bursitis, right hip 09/27/2016  . GAD (generalized anxiety disorder) 05/31/2014  . Rectocele 08/21/2011  . Uterovaginal prolapse 08/21/2011  . Dyspnea 10/13/2010  . Hypertension 10/13/2010    Jamey Reas PT, DPT 05/29/2019, 7:34 PM  [facility] Physical Therapy [address] [place], [place], [address] Phone: [phone]   Fax:  [phone]  Name: Chaelyn Bunyan MRN: [id number] Date of Birth: 05/13/1966

## 2019-05-31 ENCOUNTER — Telehealth: Payer: Self-pay | Admitting: Family Medicine

## 2019-05-31 DIAGNOSIS — F411 Generalized anxiety disorder: Secondary | ICD-10-CM

## 2019-06-01 NOTE — Telephone Encounter (Signed)
Requested medication (s) are due for refill today: yes  Requested medication (s) are on the active medication list: yes  Last refill:  05/08/2019  Future visit scheduled: yes  Notes to clinic:  refill cannot be delegated    Requested Prescriptions  Pending Prescriptions Disp Refills   clonazePAM (KLONOPIN) 0.5 MG tablet [Pharmacy Med Name: CLONAZEPAM 0.5 MG TABLET] 30 tablet 3    Sig: Take 1 tablet (0.5 mg total) by mouth at bedtime.      Not Delegated - Psychiatry:  Anxiolytics/Hypnotics Failed - 05/31/2019 11:02 PM      Failed - This refill cannot be delegated      Failed - Urine Drug Screen completed in last 360 days.      Passed - Valid encounter within last 6 months    Recent Outpatient Visits           4 months ago Tremor   Primary [facility] at [facility], [place], MD   10 months ago Constipation, unspecified constipation type   [facility] at [facility], Lilia Argue, MD   10 months ago Fever, unspecified   Primary Care at [place], MD   11 months ago Essential hypertension   Primary Care at [facility], Arlie Solomons, MD   1 year ago Gastroesophageal reflux disease without esophagitis   Primary Care at [facility], Arlie Solomons, MD       Future Appointments             In 1 week Forrest Moron, MD Primary Care at [place], [facility]   In 1 week Mcarthur Rossetti, MD [facility]

## 2019-06-02 ENCOUNTER — Telehealth: Payer: Self-pay | Admitting: Orthopaedic Surgery

## 2019-06-02 MED ORDER — OXYCODONE HCL 5 MG PO TABS: 5.0000 mg | ORAL_TABLET | Freq: Four times a day (QID) | ORAL | 0 refills | Status: DC | PRN

## 2019-06-02 NOTE — Telephone Encounter (Signed)
Patient called in requesting refill of Hydrocodone at [name]. Patient phone number is [phone].

## 2019-06-02 NOTE — Telephone Encounter (Signed)
Please advise 

## 2019-06-02 NOTE — Telephone Encounter (Signed)
Patient calling to check status of this request. Patient has upcoming appointment.

## 2019-06-03 ENCOUNTER — Encounter: Payer: No Typology Code available for payment source | Admitting: Physical Therapy

## 2019-06-04 NOTE — Telephone Encounter (Signed)
Pt states her appt was cancelled by the provider but she was not given any medication.  Pt states she took her last pill today and is not supposed to go off of it.  clonazePAM (KLONOPIN) 0.5 MG tablet [facility] #[id number] - [place] [address], [place] Phone:  [phone]  Fax:  [phone]

## 2019-06-05 ENCOUNTER — Ambulatory Visit: Payer: No Typology Code available for payment source | Attending: Orthopaedic Surgery | Admitting: Physical Therapy

## 2019-06-05 ENCOUNTER — Other Ambulatory Visit: Payer: Self-pay

## 2019-06-05 ENCOUNTER — Encounter: Payer: Self-pay | Admitting: Physical Therapy

## 2019-06-05 ENCOUNTER — Encounter: Payer: No Typology Code available for payment source | Admitting: Physical Therapy

## 2019-06-05 DIAGNOSIS — G8929 Other chronic pain: Secondary | ICD-10-CM | POA: Insufficient documentation

## 2019-06-05 DIAGNOSIS — M25662 Stiffness of left knee, not elsewhere classified: Secondary | ICD-10-CM | POA: Insufficient documentation

## 2019-06-05 DIAGNOSIS — R293 Abnormal posture: Secondary | ICD-10-CM | POA: Insufficient documentation

## 2019-06-05 DIAGNOSIS — M25562 Pain in left knee: Secondary | ICD-10-CM | POA: Insufficient documentation

## 2019-06-05 DIAGNOSIS — R2681 Unsteadiness on feet: Secondary | ICD-10-CM | POA: Insufficient documentation

## 2019-06-05 DIAGNOSIS — M6281 Muscle weakness (generalized): Secondary | ICD-10-CM | POA: Insufficient documentation

## 2019-06-05 DIAGNOSIS — R2689 Other abnormalities of gait and mobility: Secondary | ICD-10-CM | POA: Insufficient documentation

## 2019-06-05 DIAGNOSIS — R600 Localized edema: Secondary | ICD-10-CM | POA: Insufficient documentation

## 2019-06-05 NOTE — Therapy (Signed)
[facility] [address] [place], [place], [address] Phone: [phone]   Fax:  [phone]  Physical Therapy Treatment  Patient Details  Name: Sharon [name] MRN: [id number] Date of Birth: October 06, 1965 Referring Provider (PT): Jean Rosenthal, MD   Encounter Date: 06/05/2019  PT End of Session - 06/05/19 1945    Visit Number  2    Number of Visits  19    Date for PT Re-Evaluation  07/11/19    Authorization Type  Medcost    Authorization Time Period  MET deductible,  100% covered for 2020    PT Start Time  1533    PT Stop Time  1615    PT Time Calculation (min)  42 min    Activity Tolerance  Patient tolerated treatment well    Behavior During Therapy  [name] for tasks assessed/performed       Past Medical History:  Diagnosis Date  . Allergy   . Anal fissure   . Anemia    borderline  . Anxiety   . Arthritis   . Blood clot in vein    left leg  . Detached retina    BOTH SCLERA BUCKLE BOTH EYES  . DVT (deep venous thrombosis) (HCC)    left leg  . Endometriosis   . Family history of adverse reaction to anesthesia    DAUGHTER POST OP PONV  . GERD (gastroesophageal reflux disease)   . Glaucoma    RIGHT WORST THAN LEFT  . Heart murmur    "caused by anxiety"   . History of hemorrhoids   . History of kidney stones   . Hypertension   . IBS (irritable bowel syndrome)   . Perforated eardrum, right    SMALL HOLE  . PONV (postoperative nausea and vomiting)   . Pulmonary embolism ([place]) 6 YRS AGO    Past Surgical History:  Procedure Laterality Date  . ABDOMINAL HYSTERECTOMY    . CATARACT EXTRACTION Bilateral 2015  . COLONOSCOPY    . CYSTOSCOPY W/ URETERAL STENT PLACEMENT Right 05/23/2017   Procedure: CYSTOSCOPY WITH RETROGRADE PYELOGRAM/URETERAL RIGHT STENT PLACEMENT;  Surgeon: Bjorn Loser, MD;  Location: [facility];  Service: Urology;  Laterality: Right;  . CYSTOSCOPY W/ URETERAL STENT PLACEMENT  Right 06/04/2017   Procedure: CYSTOSCOPY WITHRIGHT RETROGRADE PYELOGRAMRIGHT /URETEREOSCOPY  STENT PLACEMENT;  Surgeon: Lucas Mallow, MD;  Location: [facility];  Service: Urology;  Laterality: Right;  . ENDOMETRIAL ABLATION    . EYE SURGERY Bilateral    cataract surgery with lens implants  . [place]  2011  . INCONTINENCE SURGERY    . INNER EAR SURGERY     X 2  . KNEE SURGERY    . RECTAL PROLAPSE REPAIR    . RETINAL DETACHMENT SURGERY Bilateral 2000  . RETINAL DETACHMENT SURGERY Bilateral   . TOTAL KNEE ARTHROPLASTY Left 04/29/2019  . TOTAL KNEE ARTHROPLASTY Left 04/29/2019   Procedure: LEFT TOTAL KNEE ARTHROPLASTY;  Surgeon: Mcarthur Rossetti, MD;  Location: [place];  Service: Orthopedics;  Laterality: Left;  . TUBAL LIGATION    . VEIN SURGERY Left 04/2010    There were no vitals filed for this visit.  Subjective Assessment - 06/05/19 1936    Subjective  Pt denies Lt knee pain upon arrival. She took pain medication prior to her session.    Pertinent History  right shoulder impingement, cervicalgia, right Trochanteric bursitis, dyspnea, HTN,    Patient Stated Goals  To walk  without cane or pain.    Currently in Pain?  No/denies    Pain Onset  More than a month ago                       [facility] Adult PT Treatment/Exercise - 06/05/19 0001      Ambulation/Gait   Gait Comments  Pt ambulating with SPC in Lt UE, PT providing verbal cuing to increase Lt knee flexion during swing and toe pushoff 3x83f       Exercises   Exercises  Knee/Hip      Knee/Hip Exercises: Stretches   Passive Hamstring Stretch  Left;2 reps;20 seconds    Passive Hamstring Stretch Limitations  standing with LE propped on step     Gastroc Stretch  Left;2 reps;20 seconds    Gastroc Stretch Limitations  standing slant board    Other Knee/Hip Stretches  Lt knee flexion with Rt LE overpressure 10x10 sec hold       Knee/Hip Exercises: Seated   Other Seated  Knee/Hip Exercises  Lt knee extension stretch with heel propped: gentle over pressure by pt with quad set 10x5 sec hold       Manual Therapy   Manual Therapy  Soft tissue mobilization;Joint mobilization    Joint Mobilization  seated: Lt tibiofemoral AP mobilization with medial rotation grade III x2 bouts; proximal AP fibular mobilization grade III-IV x2 bouts    Soft tissue mobilization  scar massage along surgical incision             PT Education - 06/05/19 1945    Education Details  gait training; HEP implemented but unable to save/print due to power outage    Person(s) Educated  Patient    Methods  Explanation    Comprehension  Verbalized understanding          PT Long Term Goals - 05/29/19 1928      PT LONG TERM GOAL #1   Title  PROM Left knee flexion 90*    Time  6    Period  Weeks    Status  New    Target Date  07/11/19      PT LONG TERM GOAL #2   Title  PROM Left knee extension -10    Time  6    Period  Weeks    Status  New    Target Date  07/11/19      PT LONG TERM GOAL #3   Title  Single leg stance LLE >/= 5 sec    Time  6    Period  Weeks    Status  New    Target Date  07/11/19      PT LONG TERM GOAL #4   Title  Gait Velocity >2.62 ft/sec to indicate community mobility    Time  6    Period  Weeks    Status  New    Target Date  07/11/19      PT LONG TERM GOAL #5   Title  Patient ambulates >400' without assistive device modified independent.    Time  6    Period  Weeks    Status  New    Target Date  07/11/19      PT LONG TERM GOAL #6   Title  Patient negotiates stairs with single rail, ramps & curbs without assistive device modified independent.    Time  6    Period  Weeks    Status  New  Target Date  07/11/19      PT LONG TERM GOAL #7   Title  Patient reports left knee pain </= 6/10 without prescription pain medications (using only over the counter meds)    Time  6    Period  Weeks    Status  New    Target Date  07/11/19             Plan - 06/05/19 1946    Clinical Impression Statement  Pt arrived today having only attended her evaluation one week ago. Pt was concerned with her progress due to issues with scheduling, however she has been trying to keep her knee moving at home as much as possible. Pt's Lt knee flexion is increased to 80 deg this visit. PT educated pt on proper gait mechanics and was able to begin a HEP. Pt demonstrated understanding of this. Will continue to address gait mecahncis, knee ROM and mobility moving forward.    Personal Factors and Comorbidities  Comorbidity 3+;Fitness;Past/Current Experience    Comorbidities  right shoulder impingement, cervicalgia, right Trochanteric bursitis, dyspnea, HTN    Examination-Activity Limitations  Bend;Lift;Locomotion Level;Stand;Stairs;Transfers    Examination-Participation Restrictions  Community Activity    Stability/Clinical Decision Making  Stable/Uncomplicated    Rehab Potential  Good    PT Frequency  3x / week    PT Duration  6 weeks    PT Treatment/Interventions  ADLs/Self Care Home Management;Electrical Stimulation;Moist Heat;DME Instruction;Gait training;Stair training;Functional mobility training;Therapeutic activities;Therapeutic exercise;Balance training;Neuromuscular re-education;Patient/family education;Manual techniques;Scar mobilization;Compression bandaging;Passive range of motion;Dry needling;Taping;Vasopneumatic Device;Joint Manipulations    PT Next Visit Plan  pt needs new copy of HEP-update/add as needed; scar massage as able, knee ROM progression, gait mechanics    PT Home Exercise Plan  [facility]    Consulted and Agree with Plan of Care  Patient       Patient will benefit from skilled therapeutic intervention in order to improve the following deficits and impairments:  Abnormal gait, Decreased activity tolerance, Decreased balance, Decreased endurance, Decreased mobility, Decreased range of motion, Decreased skin integrity,  Decreased scar mobility, Decreased strength, Increased edema, Impaired flexibility, Postural dysfunction, Pain  Visit Diagnosis: Other abnormalities of gait and mobility  Unsteadiness on feet  Abnormal posture  Muscle weakness  Stiffness of left knee, not elsewhere classified  Localized edema  Chronic pain of left knee     Problem List Patient Active Problem List   Diagnosis Date Noted  . Status post total left knee replacement 04/29/2019  . Impingement syndrome of right shoulder 10/28/2018  . Chronic pain of left knee 03/27/2018  . Chronic right shoulder pain 03/27/2018  . Cervicalgia 03/27/2018  . Status post arthroscopy of left knee 08/06/2017  . Unilateral primary osteoarthritis, left knee 08/06/2017  . Tremor 07/10/2017  . Other meniscus derangements, posterior horn of medial meniscus, left knee 06/28/2017  . Ureteral stone 05/23/2017  . History of sepsis 05/23/2017  . Acute pain of left knee 04/09/2017  . Trochanteric bursitis, right hip 09/27/2016  . GAD (generalized anxiety disorder) 05/31/2014  . Rectocele 08/21/2011  . Uterovaginal prolapse 08/21/2011  . Dyspnea 10/13/2010  . Hypertension 10/13/2010    8:02 PM,06/05/19 Sherol Dade PT, DPT [place] [facility] [address] [place], [place], [address] Phone: [phone]   Fax:  [phone]  Name: Sharon [name] MRN: [id number] Date of Birth: [birth year]

## 2019-06-06 ENCOUNTER — Ambulatory Visit: Payer: No Typology Code available for payment source | Admitting: Physical Therapy

## 2019-06-06 ENCOUNTER — Other Ambulatory Visit: Payer: Self-pay

## 2019-06-06 ENCOUNTER — Encounter: Payer: Self-pay | Admitting: Physical Therapy

## 2019-06-06 DIAGNOSIS — M25562 Pain in left knee: Secondary | ICD-10-CM

## 2019-06-06 DIAGNOSIS — G8929 Other chronic pain: Secondary | ICD-10-CM

## 2019-06-06 DIAGNOSIS — R293 Abnormal posture: Secondary | ICD-10-CM

## 2019-06-06 DIAGNOSIS — M25662 Stiffness of left knee, not elsewhere classified: Secondary | ICD-10-CM

## 2019-06-06 DIAGNOSIS — R600 Localized edema: Secondary | ICD-10-CM

## 2019-06-06 DIAGNOSIS — R2689 Other abnormalities of gait and mobility: Secondary | ICD-10-CM | POA: Diagnosis not present

## 2019-06-06 DIAGNOSIS — M6281 Muscle weakness (generalized): Secondary | ICD-10-CM

## 2019-06-06 DIAGNOSIS — R2681 Unsteadiness on feet: Secondary | ICD-10-CM

## 2019-06-06 NOTE — Therapy (Addendum)
[facility] [address], [place], [place], [address] Phone: [phone]   Fax:  [phone]  Physical Therapy Treatment  Patient Details  Name: Sharon [name] MRN: [id number] Date of Birth: 01-Apr-1966 Referring Provider (PT): Jean Rosenthal, MD   Encounter Date: 06/06/2019  PT End of Session - 06/06/19 1205    Visit Number  3    Number of Visits  19    Date for PT Re-Evaluation  07/11/19    Authorization Type  Med[employer]    Authorization Time Period  MET deductible,  100% covered for 2020    PT Start Time  0931    PT Stop Time  1013    PT Time Calculation (min)  42 min    Activity Tolerance  Patient tolerated treatment well    Behavior During Therapy  [facility] for tasks assessed/performed       Past Medical History:  Diagnosis Date  . Allergy   . Anal fissure   . Anemia    borderline  . Anxiety   . Arthritis   . Blood clot in vein    left leg  . Detached retina    BOTH SCLERA BUCKLE BOTH EYES  . DVT (deep venous thrombosis) (HCC)    left leg  . Endometriosis   . Family history of adverse reaction to anesthesia    DAUGHTER POST OP PONV  . GERD (gastroesophageal reflux disease)   . Glaucoma    RIGHT WORST THAN LEFT  . Heart murmur    "caused by anxiety"   . History of hemorrhoids   . History of kidney stones   . Hypertension   . IBS (irritable bowel syndrome)   . Perforated eardrum, right    SMALL HOLE  . PONV (postoperative nausea and vomiting)   . Pulmonary embolism ([place]) 6 YRS AGO    Past Surgical History:  Procedure Laterality Date  . ABDOMINAL HYSTERECTOMY    . CATARACT EXTRACTION Bilateral 2015  . COLONOSCOPY    . CYSTOSCOPY W/ URETERAL STENT PLACEMENT Right 05/23/2017   Procedure: CYSTOSCOPY WITH RETROGRADE PYELOGRAM/URETERAL RIGHT STENT PLACEMENT;  Surgeon: Bjorn Loser, MD;  Location: [facility];  Service: Urology;  Laterality: Right;  . CYSTOSCOPY W/ URETERAL STENT PLACEMENT  Right 06/04/2017   Procedure: CYSTOSCOPY WITHRIGHT RETROGRADE PYELOGRAMRIGHT /URETEREOSCOPY  STENT PLACEMENT;  Surgeon: Lucas Mallow, MD;  Location: [facility];  Service: Urology;  Laterality: Right;  . ENDOMETRIAL ABLATION    . EYE SURGERY Bilateral    cataract surgery with lens implants  . [place]  2011  . INCONTINENCE SURGERY    . INNER EAR SURGERY     X 2  . KNEE SURGERY    . RECTAL PROLAPSE REPAIR    . RETINAL DETACHMENT SURGERY Bilateral 2000  . RETINAL DETACHMENT SURGERY Bilateral   . TOTAL KNEE ARTHROPLASTY Left 04/29/2019  . TOTAL KNEE ARTHROPLASTY Left 04/29/2019   Procedure: LEFT TOTAL KNEE ARTHROPLASTY;  Surgeon: Mcarthur Rossetti, MD;  Location: [facility];  Service: Orthopedics;  Laterality: Left;  . TUBAL LIGATION    . VEIN SURGERY Left 04/2010    There were no vitals filed for this visit.  Subjective Assessment - 06/06/19 0936    Subjective  Pt states she felt better after PT yesterday.  Pain at night when the pain meds wore off but states has taken before coming and no pain now.    Currently in Pain?  No/denies  [facility] Adult PT Treatment/Exercise - 06/06/19 0001      Ambulation/Gait   Ambulation Distance (Feet)  100 Feet    Gait Comments  Pt ambulating with SPC in Rt UE and cues for heel - toe - bend for improved gait mechanics      Knee/Hip Exercises: Aerobic   Nustep  L1 x 6 min for ROM and quad - PT present to get status update      Knee/Hip Exercises: Standing   Other Standing Knee Exercises  weight shifting with emphasis on weight into Lt heel to improve heel strike      Knee/Hip Exercises: Seated   Long Arc Quad  Strengthening;Left;10 reps   pt states she is doing these at home     Knee/Hip Exercises: Supine   Short Arc Quad Sets  Strengthening;Left;20 reps    Terminal Knee Extension  Strengthening;Left;AROM;20 reps   towel under knee with quad set 5 sec     Manual Therapy    Joint Mobilization  seated: Lt tibiofemoral AP mobilization with medial rotation grade III x2 bouts; proximal AP fibular mobilization grade III-IV x2 bouts    Soft tissue mobilization  scar massage along surgical incision                  PT Long Term Goals - 06/06/19 0956      PT LONG TERM GOAL #1   Title  PROM Left knee flexion 90*    Baseline  86    Status  On-going            Plan - 06/09/19 0738    Clinical Impression Statement  Pt felt better after treatment and doing ROM.  She continues to be very stiff and has pain with ROM, but tolerated it well today. Pt does not appear to have swelling and she ices her knee at home.  Pt will benefit from skilled PT to continue according to POC.    PT Treatment/Interventions  ADLs/Self Care Home Management;Electrical Stimulation;Moist Heat;DME Instruction;Gait training;Stair training;Functional mobility training;Therapeutic activities;Therapeutic exercise;Balance training;Neuromuscular re-education;Patient/family education;Manual techniques;Scar mobilization;Compression bandaging;Passive range of motion;Dry needling;Taping;Vasopneumatic Device;Joint Manipulations    PT Next Visit Plan  progress quad strength, ROM, try half circles on recumbant bike    PT Home Exercise Plan  [facility]    Consulted and Agree with Plan of Care  Patient       Patient will benefit from skilled therapeutic intervention in order to improve the following deficits and impairments:  Abnormal gait, Decreased activity tolerance, Decreased balance, Decreased endurance, Decreased mobility, Decreased range of motion, Decreased skin integrity, Decreased scar mobility, Decreased strength, Increased edema, Impaired flexibility, Postural dysfunction, Pain  Visit Diagnosis: Other abnormalities of gait and mobility  Unsteadiness on feet  Abnormal posture  Muscle weakness  Stiffness of left knee, not elsewhere classified  Localized edema  Chronic pain of left  knee     Problem List Patient Active Problem List   Diagnosis Date Noted  . Status post total left knee replacement 04/29/2019  . Impingement syndrome of right shoulder 10/28/2018  . Chronic pain of left knee 03/27/2018  . Chronic right shoulder pain 03/27/2018  . Cervicalgia 03/27/2018  . Status post arthroscopy of left knee 08/06/2017  . Unilateral primary osteoarthritis, left knee 08/06/2017  . Tremor 07/10/2017  . Other meniscus derangements, posterior horn of medial meniscus, left knee 06/28/2017  . Ureteral stone 05/23/2017  . History of sepsis 05/23/2017  . Acute pain of left knee 04/09/2017  .  Trochanteric bursitis, right hip 09/27/2016  . GAD (generalized anxiety disorder) 05/31/2014  . Rectocele 08/21/2011  . Uterovaginal prolapse 08/21/2011  . Dyspnea 10/13/2010  . Hypertension 10/13/2010    Jule Ser, PT 06/09/2019, 7:43 AM  [facility] [address] [place], [place], [address] Phone: [phone]   Fax:  [phone]  Name: Copper Kirtley MRN: [id number] Date of Birth: [birth year]

## 2019-06-09 ENCOUNTER — Encounter: Payer: Self-pay | Admitting: Family Medicine

## 2019-06-09 ENCOUNTER — Ambulatory Visit: Payer: No Typology Code available for payment source | Admitting: Physical Therapy

## 2019-06-09 ENCOUNTER — Telehealth: Payer: Self-pay | Admitting: Orthopaedic Surgery

## 2019-06-09 ENCOUNTER — Ambulatory Visit (INDEPENDENT_AMBULATORY_CARE_PROVIDER_SITE_OTHER): Payer: PRIVATE HEALTH INSURANCE | Admitting: Family Medicine

## 2019-06-09 ENCOUNTER — Other Ambulatory Visit: Payer: Self-pay

## 2019-06-09 ENCOUNTER — Encounter: Payer: Self-pay | Admitting: Physical Therapy

## 2019-06-09 VITALS — BP 136/80 | HR 75 | Temp 99.1°F | Resp 16 | Ht 67.5 in | Wt 150.6 lb

## 2019-06-09 DIAGNOSIS — M25562 Pain in left knee: Secondary | ICD-10-CM

## 2019-06-09 DIAGNOSIS — K219 Gastro-esophageal reflux disease without esophagitis: Secondary | ICD-10-CM | POA: Diagnosis not present

## 2019-06-09 DIAGNOSIS — D62 Acute posthemorrhagic anemia: Secondary | ICD-10-CM

## 2019-06-09 DIAGNOSIS — R293 Abnormal posture: Secondary | ICD-10-CM

## 2019-06-09 DIAGNOSIS — M6281 Muscle weakness (generalized): Secondary | ICD-10-CM

## 2019-06-09 DIAGNOSIS — I10 Essential (primary) hypertension: Secondary | ICD-10-CM | POA: Diagnosis not present

## 2019-06-09 DIAGNOSIS — R2689 Other abnormalities of gait and mobility: Secondary | ICD-10-CM | POA: Diagnosis not present

## 2019-06-09 DIAGNOSIS — F411 Generalized anxiety disorder: Secondary | ICD-10-CM | POA: Diagnosis not present

## 2019-06-09 DIAGNOSIS — G8929 Other chronic pain: Secondary | ICD-10-CM

## 2019-06-09 DIAGNOSIS — R2681 Unsteadiness on feet: Secondary | ICD-10-CM

## 2019-06-09 DIAGNOSIS — R600 Localized edema: Secondary | ICD-10-CM

## 2019-06-09 DIAGNOSIS — M25662 Stiffness of left knee, not elsewhere classified: Secondary | ICD-10-CM

## 2019-06-09 LAB — POCT CBC
Granulocyte percent: 80.6 %G — AB (ref 37–80)
HCT, POC: 39.3 % (ref 29–41)
Hemoglobin: 13.7 g/dL (ref 11–14.6)
Lymph, poc: 1.2 (ref 0.6–3.4)
MCH, POC: 29.5 pg (ref 27–31.2)
MCHC: 34.8 g/dL (ref 31.8–35.4)
MCV: 84.7 fL (ref 76–111)
MID (cbc): 0.2 (ref 0–0.9)
MPV: 8.3 fL (ref 0–99.8)
POC Granulocyte: 5.9 (ref 2–6.9)
POC LYMPH PERCENT: 16.1 %L (ref 10–50)
POC MID %: 3.3 %M (ref 0–12)
Platelet Count, POC: 321 10*3/uL (ref 142–424)
RBC: 4.64 M/uL (ref 4.04–5.48)
RDW, POC: 13.4 %
WBC: 7.3 10*3/uL (ref 4.6–10.2)

## 2019-06-09 MED ORDER — OXYCODONE HCL 5 MG PO TABS: 5.0000 mg | ORAL_TABLET | Freq: Four times a day (QID) | ORAL | 0 refills | Status: DC | PRN

## 2019-06-09 MED ORDER — CLONAZEPAM 0.5 MG PO TABS: 0.5000 mg | ORAL_TABLET | Freq: Every day | ORAL | 3 refills | Status: DC

## 2019-06-09 MED ORDER — OMEPRAZOLE 20 MG PO CPDR: 20.0000 mg | DELAYED_RELEASE_CAPSULE | Freq: Two times a day (BID) | ORAL | 3 refills | Status: DC

## 2019-06-09 NOTE — Therapy (Signed)
[facility] [address], [place], [place], [address] Phone: [phone]   Fax:  [phone]  Physical Therapy Treatment  Patient Details  Name: Sharon [name] MRN: [id number] Date of Birth: 26-Jan-1966 Referring Provider (PT): Jean Rosenthal, MD   Encounter Date: 06/09/2019  PT End of Session - 06/09/19 1457    Visit Number  4    Number of Visits  19    Date for PT Re-Evaluation  07/11/19    Authorization Type  Medcost    Authorization Time Period  MET deductible,  100% covered for 2020    PT Start Time  1455   10 min late   PT Stop Time  1545    PT Time Calculation (min)  50 min    Activity Tolerance  Patient tolerated treatment well    Behavior During Therapy  [facility] for tasks assessed/performed       Past Medical History:  Diagnosis Date  . Allergy   . Anal fissure   . Anemia    borderline  . Anxiety   . Arthritis   . Blood clot in vein    left leg  . Detached retina    BOTH SCLERA BUCKLE BOTH EYES  . DVT (deep venous thrombosis) (HCC)    left leg  . Endometriosis   . Family history of adverse reaction to anesthesia    DAUGHTER POST OP PONV  . GERD (gastroesophageal reflux disease)   . Glaucoma    RIGHT WORST THAN LEFT  . Heart murmur    "caused by anxiety"   . History of hemorrhoids   . History of kidney stones   . Hypertension   . IBS (irritable bowel syndrome)   . Perforated eardrum, right    SMALL HOLE  . PONV (postoperative nausea and vomiting)   . Pulmonary embolism ([place]) 6 YRS AGO    Past Surgical History:  Procedure Laterality Date  . ABDOMINAL HYSTERECTOMY    . CATARACT EXTRACTION Bilateral 2015  . COLONOSCOPY    . CYSTOSCOPY W/ URETERAL STENT PLACEMENT Right 05/23/2017   Procedure: CYSTOSCOPY WITH RETROGRADE PYELOGRAM/URETERAL RIGHT STENT PLACEMENT;  Surgeon: Bjorn Loser, MD;  [facility];  Service: Urology;  Laterality: Right;  . CYSTOSCOPY W/ URETERAL  STENT PLACEMENT Right 06/04/2017   Procedure: CYSTOSCOPY WITHRIGHT RETROGRADE PYELOGRAMRIGHT /URETEREOSCOPY  STENT PLACEMENT;  Surgeon: Lucas Mallow, MD;  Location: [facility];  Service: Urology;  Laterality: Right;  . ENDOMETRIAL ABLATION    . EYE SURGERY Bilateral    cataract surgery with lens implants  . [place]  2011  . INCONTINENCE SURGERY    . INNER EAR SURGERY     X 2  . KNEE SURGERY    . RECTAL PROLAPSE REPAIR    . RETINAL DETACHMENT SURGERY Bilateral 2000  . RETINAL DETACHMENT SURGERY Bilateral   . TOTAL KNEE ARTHROPLASTY Left 04/29/2019  . TOTAL KNEE ARTHROPLASTY Left 04/29/2019   Procedure: LEFT TOTAL KNEE ARTHROPLASTY;  Surgeon: Mcarthur Rossetti, MD;  Location: [facility];  Service: Orthopedics;  Laterality: Left;  . TUBAL LIGATION    . VEIN SURGERY Left 04/2010    There were no vitals filed for this visit.  Subjective Assessment - 06/09/19 1459    Subjective  I feel weak today ( pain meds). Just feel puny....knee is stiff.    Pertinent History  right shoulder impingement, cervicalgia, right Trochanteric bursitis, dyspnea, HTN,    Currently in Pain?  No/denies  stiff knee   Aggravating Factors   Sitting too long    Pain Relieving Factors  meds    Multiple Pain Sites  No         [facility] PT Assessment - 06/09/19 0001      AROM   Left Knee Flexion  90                   [facility] Adult PT Treatment/Exercise - 06/09/19 0001      Knee/Hip Exercises: Aerobic   Nustep  L1 x 8 min for ROM and quad - PTA present to get status update      Vasopneumatic   Number Minutes Vasopneumatic   10 minutes    Vasopnuematic Location   Knee    Vasopneumatic Pressure  Low    Vasopneumatic Temperature   3 flakes      Manual Therapy   Manual therapy comments  Gastroc and quad soft tissue work    Soft tissue mobilization  scar massage along surgical incision   Passive stretching for knee flexion in sitting:                  PT Long Term Goals - 06/06/19 0956      PT LONG TERM GOAL #1   Title  PROM Left knee flexion 90*    Baseline  86    Status  On-going            Plan - 06/09/19 1458    Clinical Impression Statement  Pt arrives puny feeling, she reports this is mainly from her medication. PTA worked on her scar mobility and passive stretching for knee flexion today. Pt achieved 90 degrees in supine for active flexion by end of session. Pt required a lot of positive reinforcement, encouragement, and motivation  throuhgout the session.    Personal Factors and Comorbidities  Comorbidity 3+;Fitness;Past/Current Experience    Comorbidities  right shoulder impingement, cervicalgia, right Trochanteric bursitis, dyspnea, HTN    Examination-Activity Limitations  Bend;Lift;Locomotion Level;Stand;Stairs;Transfers    Examination-Participation Restrictions  Community Activity    Stability/Clinical Decision Making  Stable/Uncomplicated    Rehab Potential  Good    PT Frequency  3x / week    PT Duration  6 weeks    PT Treatment/Interventions  ADLs/Self Care Home Management;Electrical Stimulation;Moist Heat;DME Instruction;Gait training;Stair training;Functional mobility training;Therapeutic activities;Therapeutic exercise;Balance training;Neuromuscular re-education;Patient/family education;Manual techniques;Scar mobilization;Compression bandaging;Passive range of motion;Dry needling;Taping;Vasopneumatic Device;Joint Manipulations    PT Next Visit Plan  progress quad strength, ROM, try half circles on recumbant bike    PT Home Exercise Plan  [facility]    Consulted and Agree with Plan of Care  Patient       Patient will benefit from skilled therapeutic intervention in order to improve the following deficits and impairments:  Abnormal gait, Decreased activity tolerance, Decreased balance, Decreased endurance, Decreased mobility, Decreased range of motion, Decreased skin integrity, Decreased scar  mobility, Decreased strength, Increased edema, Impaired flexibility, Postural dysfunction, Pain  Visit Diagnosis: Other abnormalities of gait and mobility  Unsteadiness on feet  Abnormal posture  Muscle weakness  Stiffness of left knee, not elsewhere classified  Localized edema  Chronic pain of left knee     Problem List Patient Active Problem List   Diagnosis Date Noted  . Status post total left knee replacement 04/29/2019  . Impingement syndrome of right shoulder 10/28/2018  . Chronic pain of left knee 03/27/2018  . Chronic right shoulder pain 03/27/2018  . Cervicalgia 03/27/2018  .  Exposure of implanted vaginal mesh ([place]) 03/06/2018  . Outlet dysfunction constipation 03/06/2018  . Status post arthroscopy of left knee 08/06/2017  . Unilateral primary osteoarthritis, left knee 08/06/2017  . Tremor 07/10/2017  . Other meniscus derangements, posterior horn of medial meniscus, left knee 06/28/2017  . Ureteral stone 05/23/2017  . History of sepsis 05/23/2017  . Acute pain of left knee 04/09/2017  . Trochanteric bursitis, right hip 09/27/2016  . GAD (generalized anxiety disorder) 05/31/2014  . Rectocele 08/21/2011  . Uterovaginal prolapse 08/21/2011  . Dyspnea 10/13/2010  . Hypertension 10/13/2010    Myleigh Amara, PTA 06/09/2019, 3:58 PM  [facility] [address], [place], [place], [address] Phone: [phone]   Fax:  [phone]  Name: Sharon [name] MRN: [id number] Date of Birth: October 10, 1965

## 2019-06-09 NOTE — Telephone Encounter (Signed)
Patient called. She is requesting a refill on her OxyCODONE.   Her call back number is: [phone]

## 2019-06-09 NOTE — Patient Instructions (Signed)
° ° ° °  If you have lab work done today you will be contacted with your lab results within the next 2 weeks.  If you have not heard from us then please contact us. The fastest way to get your results is to register for My Chart. ° ° °IF you received an x-ray today, you will receive an invoice from [facility]. Please contact [facility] at [phone] with questions or concerns regarding your invoice.  ° °IF you received labwork today, you will receive an invoice from [facility]. Please contact [facility] at [phone] with questions or concerns regarding your invoice.  ° °Our billing staff will not be able to assist you with questions regarding bills from these companies. ° °You will be contacted with the lab results as soon as they are available. The fastest way to get your results is to activate your My Chart account. Instructions are located on the last page of this paperwork. If you have not heard from us regarding the results in 2 weeks, please contact this office. °  ° ° ° °

## 2019-06-09 NOTE — Progress Notes (Signed)
Established Patient Office Visit  Subjective:  Patient ID: Sharon [name], female    DOB: Jul 14, 1965  Age: 53 y.o. MRN: [id number]  CC:  Chief Complaint  Patient presents with  . med recheck    wants refill on klonopin    HPI [facility] presents for   She reports that she takes oxycodone for her knee s/p knee surgery on the left She is going to PT for her left knee and will go after she leaves here  She states that she takes miralax for her constipation from the opiates She took her pain meds today and takes a nausea medication with it  She reports that she needs a refill of the clonazapam She reports that she is not taking iron    Wt Readings from Last 3 Encounters:  06/09/19 150 lb 9.6 oz (68.3 kg)  04/29/19 161 lb 2.5 oz (73.1 kg)  04/25/19 161 lb 4 oz (73.1 kg)    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 She is not exercising and is adherent to low salt diet.  Blood pressure is well controlled at home. Cardiac symptoms none. Patient denies chest pain, chest pressure/discomfort, dyspnea, exertional chest pressure/discomfort, fatigue and lower extremity edema.  Cardiovascular risk factors: hypertension. Use of agents associated with hypertension: none. History of target organ damage: none. She is on Bystolic.  Laryngeal reflux She is taking omeprazole daily but still has night time symptoms She states that she coughs and wheezes when her reflux acts up Her omeprazole was started after the Pulmonologist told her that she did not have asthma.   Past Medical History:  Diagnosis Date  . Allergy   . Anal fissure   . Anemia    borderline  . Anxiety   . Arthritis   . Blood clot in vein    left leg  . Detached retina    BOTH SCLERA BUCKLE BOTH EYES  . DVT (deep venous thrombosis) (HCC)    left leg  .  Endometriosis   . Family history of adverse reaction to anesthesia    DAUGHTER POST OP PONV  . GERD (gastroesophageal reflux disease)   . Glaucoma    RIGHT WORST THAN LEFT  . Heart murmur    "caused by anxiety"   . History of hemorrhoids   . History of kidney stones   . Hypertension   . IBS (irritable bowel syndrome)   . Perforated eardrum, right    SMALL HOLE  . PONV (postoperative nausea and vomiting)   . Pulmonary embolism ([facility]) 6 YRS AGO    Past Surgical History:  Procedure Laterality Date  . ABDOMINAL HYSTERECTOMY    . CATARACT EXTRACTION Bilateral 2015  . COLONOSCOPY    . CYSTOSCOPY W/ URETERAL STENT PLACEMENT Right 05/23/2017   Procedure: CYSTOSCOPY WITH RETROGRADE PYELOGRAM/URETERAL RIGHT STENT PLACEMENT;  Surgeon: Bjorn Loser, MD;  Location: [facility];  Service: Urology;  Laterality: Right;  . CYSTOSCOPY W/ URETERAL STENT PLACEMENT Right 06/04/2017   Procedure: CYSTOSCOPY WITHRIGHT RETROGRADE PYELOGRAMRIGHT /URETEREOSCOPY  STENT PLACEMENT;  Surgeon: Lucas Mallow, MD;  Location: [facility];  Service: Urology;  Laterality: Right;  . ENDOMETRIAL ABLATION    . EYE SURGERY Bilateral    cataract surgery with lens implants  . [place]  2011  . INCONTINENCE SURGERY    . INNER  EAR SURGERY     X 2  . KNEE SURGERY    . RECTAL PROLAPSE REPAIR    . RETINAL DETACHMENT SURGERY Bilateral 2000  . RETINAL DETACHMENT SURGERY Bilateral   . TOTAL KNEE ARTHROPLASTY Left 04/29/2019  . TOTAL KNEE ARTHROPLASTY Left 04/29/2019   Procedure: LEFT TOTAL KNEE ARTHROPLASTY;  Surgeon: Mcarthur Rossetti, MD;  Location: [facility];  Service: Orthopedics;  Laterality: Left;  . TUBAL LIGATION    . VEIN SURGERY Left 04/2010    Family History  Problem Relation Age of Onset  . Emphysema Mother        smoker  . Allergies Mother   . Asthma Mother   . Heart disease Mother        AMI as cause of death  . COPD Mother   . Asthma Father   . Heart disease Father          AMI as cause of death  . Diabetes Father   . Asthma Brother   . Heart disease Brother 37       heart failure  . Lung cancer Maternal Grandfather        was a smoker  . Cancer Maternal Grandfather        lung  . Heart disease Paternal Grandmother   . Heart disease Paternal Grandfather   . Colon cancer Neg Hx     Social History   Socioeconomic History  . Marital status: Divorced    Spouse name: Not on file  . Number of children: 1  . Years of education: Not on file  . Highest education level: Not on file  Occupational History  . Occupation: Restaurant manager, fast food: [employer]  Tobacco Use  . Smoking status: Never Smoker  . Smokeless tobacco: Never Used  Substance and Sexual Activity  . Alcohol use: Not Currently    Comment: occ  . Drug use: No    Comment: former maryjuana use- quit in 1995  . Sexual activity: Yes  Other Topics Concern  . Not on file  Social History Narrative   Marital status: divorced x 2; dating seriously x 3 years.  Boyfriend with HIV.       Children:  1 daughter (29); no grandchildren      Lives:  With boyfriend.        Employment: works at [employer]: none      Alcohol: rarely; socially      Exercise: none; has treadmill and elliptical and bikes   Coffee in am / 1/2 of coke in afternoon    [employer] school education   Social Determinants of Health   Financial Resource Strain:   . Difficulty of Paying Living Expenses: Not on file  Food Insecurity:   . Worried About Charity fundraiser in the Last Year: Not on file  . Ran Out of Food in the Last Year: Not on file  Transportation Needs:   . Lack of Transportation (Medical): Not on file  . Lack of Transportation (Non-Medical): Not on file  Physical Activity:   . Days of Exercise per Week: Not on file  . Minutes of Exercise per Session: Not on file  Stress:   . Feeling of Stress : Not on file  Social Connections:   . Frequency of Communication with Friends and  Family: Not on file  . Frequency of Social Gatherings with Friends and Family: Not on file  .  Attends Religious Services: Not on file  . Active Member of Clubs or Organizations: Not on file  . Attends Archivist Meetings: Not on file  . Marital Status: Not on file  Intimate Partner Violence:   . Fear of Current or Ex-Partner: Not on file  . Emotionally Abused: Not on file  . Physically Abused: Not on file  . Sexually Abused: Not on file    Outpatient Medications Prior to Visit  Medication Sig Dispense Refill  . acetaminophen (TYLENOL) 500 MG tablet Take 1,000 mg by mouth every 8 (eight) hours as needed for moderate pain.     Marland Kitchen acyclovir (ZOVIRAX) 400 MG tablet Take 1 tablet (400 mg total) by mouth at bedtime. 90 tablet 3  . Ascorbic Acid (VITAMIN C) 1000 MG tablet Take 1,000 mg by mouth daily.    . ASPIRIN LOW DOSE 81 MG chewable tablet CHEW 1 TABLET (81 MG TOTAL) BY MOUTH 2 (TWO) TIMES DAILY. 30 tablet 0  . celecoxib (CELEBREX) 200 MG capsule Take 1 capsule (200 mg total) by mouth 2 (two) times daily. 60 capsule 1  . Cholecalciferol (VITAMIN D) 2000 UNITS tablet Take 2,000 Units by mouth daily.    Marland Kitchen EPINEPHrine 0.3 mg/0.3 mL IJ SOAJ injection Inject 0.3 mLs (0.3 mg total) into the muscle once as needed (For anaphylaxis.). (Patient taking differently: Inject 0.3 mg into the muscle once as needed for anaphylaxis. ) 1 each 1  . ibuprofen (ADVIL,MOTRIN) 200 MG tablet Take 400 mg by mouth 2 (two) times daily.     . Magnesium 500 MG CAPS Take 500 mg by mouth daily.     . methocarbamol (ROBAXIN) 500 MG tablet Take 1 tablet (500 mg total) by mouth every 6 (six) hours as needed for muscle spasms. 60 tablet 1  . Multiple Vitamin (MULTI-VITAMINS) TABS Take 1 tablet by mouth daily.     . nebivolol (BYSTOLIC) 5 MG tablet Take 1 tablet (5 mg total) by mouth daily. (Patient taking differently: Take 5 mg by mouth at bedtime. ) 90 tablet 1  . Nitroglycerin (RECTIV) 0.4 % OINT Place 0.4 inches  rectally 2 (two) times daily as needed (For anal fissures.). 30 g 1  . ondansetron (ZOFRAN ODT) 4 MG disintegrating tablet Take 1 tablet (4 mg total) by mouth every 8 (eight) hours as needed for nausea or vomiting. 20 tablet 1  . ondansetron (ZOFRAN) 8 MG tablet Take 1 tablet (8 mg total) by mouth every 8 (eight) hours as needed for nausea or vomiting. 20 tablet 1  . oxyCODONE (OXY IR/ROXICODONE) 5 MG immediate release tablet Take 1-2 tablets (5-10 mg total) by mouth every 6 (six) hours as needed for moderate pain (pain score 4-6). 40 tablet 0  . Polyethylene Glycol 3350 (MIRALAX PO) Take 17 g by mouth daily as needed (constipation).     . primidone (MYSOLINE) 50 MG tablet Take 3 tablets (150 mg total) by mouth daily. 90 tablet 3  . senna-docusate (SENOKOT-S) 8.6-50 MG tablet Take 1 tablet by mouth daily as needed for moderate constipation.     . Travoprost, BAK Free, (TRAVATAN) 0.004 % SOLN ophthalmic solution INSTILL 1 DROP INTO BOTH EYES AT BEDTIME (Patient taking differently: Place 1 drop into both eyes See admin instructions. Instill 1 drop in both eyes nightly for 2 nights and on the 3rd night place in the right eye only, repeat cycle.) 5 mL 3  . vitamin B-12 (CYANOCOBALAMIN) 1000 MCG tablet Take 1 tablet (1,000 mcg total) by mouth  daily. 30 tablet 3  . zinc gluconate 50 MG tablet Take 50 mg by mouth daily.    . clonazePAM (KLONOPIN) 0.5 MG tablet Take 1 tablet (0.5 mg total) by mouth at bedtime. 30 tablet 3  . omeprazole (PRILOSEC) 20 MG capsule Take 1 capsule (20 mg total) by mouth daily. (Patient taking differently: Take 20 mg by mouth at bedtime. ) 90 capsule 3   No facility-administered medications prior to visit.    Allergies  Allergen Reactions  . Penicillins Other (See Comments)    Reaction unknown from childhood Has patient had a PCN reaction causing immediate rash, facial/tongue/throat swelling, SOB or lightheadedness with hypotension: unknown Has patient had a PCN reaction  causing severe rash involving mucus membranes or skin necrosis: unknown  Has patient had a PCN reaction that required hospitalization: no Has patient had a PCN reaction occurring within the last 10 years: no If all of the above answers are "NO", then may proceed with Cephalosporin use.   Marland Kitchen Dextromethorphan Polistirex Er Other (See Comments)    Pt states caused "a lump" in her throat, making it difficult to swallow  . Diflucan [Fluconazole]     Primidone interaction  . Hydrocodone Nausea And Vomiting  . Oxycodone-Acetaminophen Other (See Comments)    Made fingers tingle and go numb, started "feeling weird".   . Tape Itching and Other (See Comments)    Bandaids - leaves red marks    ROS Review of Systems Review of Systems  Constitutional: Negative for activity change, appetite change, chills and fever.  HENT: Negative for congestion, nosebleeds, trouble swallowing and voice change.   Respiratory: Negative for cough, shortness of breath and wheezing.   Gastrointestinal: Negative for diarrhea, nausea and vomiting.  Genitourinary: Negative for difficulty urinating, dysuria, flank pain and hematuria.  Musculoskeletal: Negative for back pain, joint swelling and neck pain.  Neurological: Negative for dizziness, speech difficulty, light-headedness and numbness.  See HPI. All other review of systems negative.     Objective:    Physical Exam  BP 136/80 (BP Location: Right Arm, Patient Position: Sitting, Cuff Size: Normal)   Pulse 75   Temp 99.1 F (37.3 C) (Oral)   Resp 16   Ht 5' 7.5" (1.715 m)   Wt 150 lb 9.6 oz (68.3 kg)   LMP 10/11/2010   SpO2 95%   BMI 23.24 kg/m  Wt Readings from Last 3 Encounters:  06/09/19 150 lb 9.6 oz (68.3 kg)  04/29/19 161 lb 2.5 oz (73.1 kg)  04/25/19 161 lb 4 oz (73.1 kg)   Physical Exam  Constitutional: Oriented to person, place, and time. Appears well-developed and well-nourished.  HENT:  Head: Normocephalic and atraumatic.  Eyes:  Conjunctivae and EOM are normal.  Cardiovascular: Normal rate, regular rhythm, normal heart sounds and intact distal pulses.  No murmur heard. Pulmonary/Chest: Effort normal and breath sounds normal. No stridor. No respiratory distress. Has no wheezes.  Neurological: Is alert and oriented to person, place, and time.  Skin: Skin is warm. Capillary refill takes less than 2 seconds.  Psychiatric: Has a normal mood and affect. Behavior is normal. Judgment and thought content normal.    Health Maintenance Due  Topic Date Due  . PAP SMEAR-Modifier  01/03/2018  . INFLUENZA VACCINE  01/18/2019    There are no preventive care reminders to display for this patient.  Lab Results  Component Value Date   TSH 1.700 07/04/2018   Lab Results  Component Value Date   WBC 7.3 06/09/2019  HGB 13.7 06/09/2019   HCT 39.3 06/09/2019   MCV 84.7 06/09/2019   PLT 223 05/01/2019   Lab Results  Component Value Date   NA 136 04/30/2019   K 4.0 04/30/2019   CO2 25 04/30/2019   GLUCOSE 133 (H) 04/30/2019   BUN 13 04/30/2019   CREATININE 0.69 04/30/2019   BILITOT 0.3 01/20/2019   ALKPHOS 80 01/20/2019   AST 13 01/20/2019   ALT 18 01/20/2019   PROT 6.9 01/20/2019   ALBUMIN 4.4 01/20/2019   CALCIUM 8.5 (L) 04/30/2019   ANIONGAP 7 04/30/2019   GFR 108.07 03/09/2015   Lab Results  Component Value Date   CHOL 154 07/04/2018   Lab Results  Component Value Date   HDL 58 07/04/2018   Lab Results  Component Value Date   LDLCALC 80 07/04/2018   Lab Results  Component Value Date   TRIG 81 07/04/2018   Lab Results  Component Value Date   CHOLHDL 2.7 07/04/2018   Lab Results  Component Value Date   HGBA1C 5.2 01/01/2018      Assessment & Plan:   Problem List Items Addressed This Visit      Cardiovascular and Mediastinum   Hypertension  -  Patient's blood pressure is at goal of 139/89 or less. Condition is stable. Continue current medications and treatment plan. I recommend that you  exercise for 30-45 minutes 5 days a week. I also recommend a balanced diet with fruits and vegetables every day, lean meats, and little fried foods. The DASH diet (you can find this online) is a good example of this.      Other   GAD (generalized anxiety disorder) - advised not to take klonopin with her opiates   Relevant Medications   clonazePAM (KLONOPIN) 0.5 MG tablet    Other Visit Diagnoses    Postoperative anemia due to acute blood loss    -  Primary Hemoglobin improved   Relevant Orders   POCT CBC (Completed)   Gastroesophageal reflux disease without esophagitis    -  Advised to increase to BID  If insurance does not cover then buy extra dose otc using goodrx coupon    Relevant Medications   omeprazole (PRILOSEC) 20 MG capsule      Meds ordered this encounter  Medications  . clonazePAM (KLONOPIN) 0.5 MG tablet    Sig: Take 1 tablet (0.5 mg total) by mouth at bedtime.    Dispense:  30 tablet    Refill:  3    This request is for a new prescription for a controlled substance as required by Federal/State law.  . omeprazole (PRILOSEC) 20 MG capsule    Sig: Take 1 capsule (20 mg total) by mouth 2 (two) times daily before a meal.    Dispense:  180 capsule    Refill:  3    Follow-up: Return in about 6 months (around 12/08/2019) for hypertension.    Forrest Moron, MD

## 2019-06-09 NOTE — Telephone Encounter (Signed)
Please advise 

## 2019-06-10 ENCOUNTER — Encounter: Payer: No Typology Code available for payment source | Admitting: Physical Therapy

## 2019-06-11 ENCOUNTER — Ambulatory Visit: Payer: No Typology Code available for payment source | Admitting: Physical Therapy

## 2019-06-11 ENCOUNTER — Ambulatory Visit (INDEPENDENT_AMBULATORY_CARE_PROVIDER_SITE_OTHER): Payer: No Typology Code available for payment source | Admitting: Orthopaedic Surgery

## 2019-06-11 ENCOUNTER — Encounter: Payer: Self-pay | Admitting: Physical Therapy

## 2019-06-11 ENCOUNTER — Other Ambulatory Visit: Payer: Self-pay

## 2019-06-11 ENCOUNTER — Encounter: Payer: Self-pay | Admitting: Orthopaedic Surgery

## 2019-06-11 DIAGNOSIS — R2681 Unsteadiness on feet: Secondary | ICD-10-CM

## 2019-06-11 DIAGNOSIS — M25662 Stiffness of left knee, not elsewhere classified: Secondary | ICD-10-CM

## 2019-06-11 DIAGNOSIS — G8929 Other chronic pain: Secondary | ICD-10-CM

## 2019-06-11 DIAGNOSIS — R293 Abnormal posture: Secondary | ICD-10-CM

## 2019-06-11 DIAGNOSIS — R600 Localized edema: Secondary | ICD-10-CM

## 2019-06-11 DIAGNOSIS — R2689 Other abnormalities of gait and mobility: Secondary | ICD-10-CM | POA: Diagnosis not present

## 2019-06-11 DIAGNOSIS — Z96652 Presence of left artificial knee joint: Secondary | ICD-10-CM

## 2019-06-11 DIAGNOSIS — M6281 Muscle weakness (generalized): Secondary | ICD-10-CM

## 2019-06-11 MED ORDER — OXYCODONE HCL 5 MG PO TABS: 5.0000 mg | ORAL_TABLET | Freq: Four times a day (QID) | ORAL | 0 refills | Status: DC | PRN

## 2019-06-11 MED ORDER — NABUMETONE 750 MG PO TABS: 750.0000 mg | ORAL_TABLET | Freq: Two times a day (BID) | ORAL | 2 refills | Status: DC | PRN

## 2019-06-11 NOTE — Therapy (Signed)
[facility] [address] [place], [place], [address] Phone: [phone]   Fax:  [phone]  Physical Therapy Treatment  Patient Details  Name: Sharon [name] MRN: [id number] Date of Birth: Mar 29, 1966 Referring Provider (PT): Jean Rosenthal, MD   Encounter Date: 06/11/2019  PT End of Session - 06/11/19 1520    Visit Number  5    Number of Visits  19    Date for PT Re-Evaluation  07/11/19    Authorization Type  Medcost    Authorization Time Period  MET deductible,  100% covered for 2020    PT Start Time  1100    PT Stop Time  1212    PT Time Calculation (min)  72 min    Activity Tolerance  Patient tolerated treatment well    Behavior During Therapy  [facility] for tasks assessed/performed       Past Medical History:  Diagnosis Date  . Allergy   . Anal fissure   . Anemia    borderline  . Anxiety   . Arthritis   . Blood clot in vein    left leg  . Detached retina    BOTH SCLERA BUCKLE BOTH EYES  . DVT (deep venous thrombosis) (HCC)    left leg  . Endometriosis   . Family history of adverse reaction to anesthesia    DAUGHTER POST OP PONV  . GERD (gastroesophageal reflux disease)   . Glaucoma    RIGHT WORST THAN LEFT  . Heart murmur    "caused by anxiety"   . History of hemorrhoids   . History of kidney stones   . Hypertension   . IBS (irritable bowel syndrome)   . Perforated eardrum, right    SMALL HOLE  . PONV (postoperative nausea and vomiting)   . Pulmonary embolism ([place]) 6 YRS AGO    Past Surgical History:  Procedure Laterality Date  . ABDOMINAL HYSTERECTOMY    . CATARACT EXTRACTION Bilateral 2015  . COLONOSCOPY    . CYSTOSCOPY W/ URETERAL STENT PLACEMENT Right 05/23/2017   Procedure: CYSTOSCOPY WITH RETROGRADE PYELOGRAM/URETERAL RIGHT STENT PLACEMENT;  Surgeon: Bjorn Loser, MD;  [facility];  Service: Urology;  Laterality: Right;  . CYSTOSCOPY W/ URETERAL STENT PLACEMENT  Right 06/04/2017   Procedure: CYSTOSCOPY WITHRIGHT RETROGRADE PYELOGRAMRIGHT /URETEREOSCOPY  STENT PLACEMENT;  Surgeon: Lucas Mallow, MD;  Location[facility];  Service: Urology;  Laterality: Right;  . ENDOMETRIAL ABLATION    . EYE SURGERY Bilateral    cataract surgery with lens implants  . [place]  2011  . INCONTINENCE SURGERY    . INNER EAR SURGERY     X 2  . KNEE SURGERY    . RECTAL PROLAPSE REPAIR    . RETINAL DETACHMENT SURGERY Bilateral 2000  . RETINAL DETACHMENT SURGERY Bilateral   . TOTAL KNEE ARTHROPLASTY Left 04/29/2019  . TOTAL KNEE ARTHROPLASTY Left 04/29/2019   Procedure: LEFT TOTAL KNEE ARTHROPLASTY;  Surgeon: Mcarthur Rossetti, MD;  Location: [facility];  Service: Orthopedics;  Laterality: Left;  . TUBAL LIGATION    . VEIN SURGERY Left 04/2010    There were no vitals filed for this visit.  Subjective Assessment - 06/11/19 1109    Subjective  I did good after last session. i have a lot of nausea today and I have nothing on my stomach so I feel weak right now. Knee pain is currently low.    Pertinent History  right shoulder  impingement, cervicalgia, right Trochanteric bursitis, dyspnea, HTN,    Currently in Pain?  Yes    Pain Score  2     Pain Location  Knee    Pain Orientation  Left    Pain Descriptors / Indicators  Dull;Aching    Multiple Pain Sites  No         [facility] PT Assessment - 06/11/19 0001      AROM   Left Knee Flexion  88   post session                  [facility] Adult PT Treatment/Exercise - 06/11/19 0001      Knee/Hip Exercises: Aerobic   Nustep  L1 x 8 min for ROM and quad - PTA present to get status update      Knee/Hip Exercises: Standing   Rebounder  weight shitfing 3 ways  1 min each       Knee/Hip Exercises: Seated   Long Arc Quad  AROM;Strengthening;Left;2 sets;10 reps    Long Arc Quad Limitations  VC to move knee/hinge vs liftting the femur up.    Hamstring Curl  AROM;Strengthening;Left;2  sets;10 reps   red band   Sit to Sand  --   Pt sitting on black pad 10x LE only: VC to WB > LT     Vasopneumatic   Number Minutes Vasopneumatic   10 minutes    Vasopnuematic Location   Knee    Vasopneumatic Pressure  Low    Vasopneumatic Temperature   3 flakes      Manual Therapy   Manual therapy comments  Gastroc and quad soft tissue work    Soft tissue mobilization  scar massage along surgical incision   Passive stretching for knee flexion in sitting:                 PT Long Term Goals - 06/06/19 0956      PT LONG TERM GOAL #1   Title  PROM Left knee flexion 90*    Baseline  86    Status  On-going            Plan - 06/11/19 1511    Clinical Impression Statement  Pt having difficult time with her medications, nausea and an overall feeling of being weak from not being able to eat anything.She has been able to maintain 90-88 degrees of active flexion in supine this week. PTA encouraged pt to change her stretching habits to longer, less intense holds versus "pulling until I cry." Pt appears to be doing better with this. Pt needs to be verbally cued to move both ankle and foot when she walks to "not walk like she has a boot on." Pt tolerated passive stretching better today and scar in general had better mobility than on Monday. [place] helping to reduce the swelling aorund the patella.    Personal Factors and Comorbidities  Comorbidity 3+;Fitness;Past/Current Experience    Comorbidities  right shoulder impingement, cervicalgia, right Trochanteric bursitis, dyspnea, HTN    Examination-Activity Limitations  Bend;Lift;Locomotion Level;Stand;Stairs;Transfers    Examination-Participation Restrictions  Community Activity    Stability/Clinical Decision Making  Stable/Uncomplicated    Rehab Potential  Good    PT Frequency  3x / week    PT Duration  6 weeks    PT Treatment/Interventions  ADLs/Self Care Home Management;Electrical Stimulation;Moist Heat;DME Instruction;Gait  training;Stair training;Functional mobility training;Therapeutic activities;Therapeutic exercise;Balance training;Neuromuscular re-education;Patient/family education;Manual techniques;Scar mobilization;Compression bandaging;Passive range of motion;Dry needling;Taping;Vasopneumatic Device;Joint Manipulations  PT Next Visit Plan  KNee ROM, sit to stand, try TKE with band, pt with very poor quad strength so anything to facilitate that would be good. Manual techniques to increase her knee flexion.    PT Home Exercise Plan  [facility]    Consulted and Agree with Plan of Care  Patient       Patient will benefit from skilled therapeutic intervention in order to improve the following deficits and impairments:  Abnormal gait, Decreased activity tolerance, Decreased balance, Decreased endurance, Decreased mobility, Decreased range of motion, Decreased skin integrity, Decreased scar mobility, Decreased strength, Increased edema, Impaired flexibility, Postural dysfunction, Pain  Visit Diagnosis: Other abnormalities of gait and mobility  Unsteadiness on feet  Abnormal posture  Muscle weakness  Stiffness of left knee, not elsewhere classified  Localized edema  Chronic pain of left knee     Problem List Patient Active Problem List   Diagnosis Date Noted  . Status post total left knee replacement 04/29/2019  . Impingement syndrome of right shoulder 10/28/2018  . Chronic pain of left knee 03/27/2018  . Chronic right shoulder pain 03/27/2018  . Cervicalgia 03/27/2018  . Exposure of implanted vaginal mesh ([place]) 03/06/2018  . Outlet dysfunction constipation 03/06/2018  . Status post arthroscopy of left knee 08/06/2017  . Unilateral primary osteoarthritis, left knee 08/06/2017  . Tremor 07/10/2017  . Other meniscus derangements, posterior horn of medial meniscus, left knee 06/28/2017  . Ureteral stone 05/23/2017  . History of sepsis 05/23/2017  . Acute pain of left knee 04/09/2017  .  Trochanteric bursitis, right hip 09/27/2016  . GAD (generalized anxiety disorder) 05/31/2014  . Rectocele 08/21/2011  . Uterovaginal prolapse 08/21/2011  . Dyspnea 10/13/2010  . Hypertension 10/13/2010    Sharon [name], PTA 06/11/2019, 3:21 PM  [facility] [address], [place], [place], [address] Phone: [phone]   Fax:  [phone]  Name: Sharon [name] MRN: [id number] Date of Birth: August 14, 1965

## 2019-06-11 NOTE — Progress Notes (Signed)
The patient is now 6 weeks status post a left total knee arthroplasty.  On examination today her extension is almost full and her flexion is to just past 90 degrees.  This is significantly improved from her last visit.  She is only been to 4 physical therapy sessions.  She is still needing a significant amount of oxycodone to help with her pain.  She is only 53 years old.  I am going to start her on Relafen twice daily and hopefully this will help with some of the inflammatory pain.  I will do a 750  mg dose.  I have encouraged her to try to wean from the pain medications but I totally understand that she needs to be on these to get her through therapy.  She will continue to push herself hard on a daily basis.  I would like to see her back in just 2 weeks to make sure she is still progressing along with her motion.

## 2019-06-12 ENCOUNTER — Telehealth: Payer: Self-pay | Admitting: Family Medicine

## 2019-06-12 ENCOUNTER — Encounter: Payer: No Typology Code available for payment source | Admitting: Physical Therapy

## 2019-06-12 NOTE — Telephone Encounter (Signed)
Please Advise

## 2019-06-12 NOTE — Telephone Encounter (Signed)
Pt stated she had knee replacement surgery and she is allergic to her pain medication but she still takes it and it causes nausea which has taken away her appetite. She would like to know if Dr. Nolon Rod can prescribe something to help her increase her appetite. Please advise.  [id number] - [place], [place] Phone:  [phone]  Fax:  [phone]

## 2019-06-14 ENCOUNTER — Other Ambulatory Visit: Payer: Self-pay | Admitting: Orthopaedic Surgery

## 2019-06-17 ENCOUNTER — Other Ambulatory Visit: Payer: Self-pay

## 2019-06-17 ENCOUNTER — Ambulatory Visit: Payer: No Typology Code available for payment source | Admitting: Physical Therapy

## 2019-06-17 DIAGNOSIS — M25562 Pain in left knee: Secondary | ICD-10-CM

## 2019-06-17 DIAGNOSIS — M25662 Stiffness of left knee, not elsewhere classified: Secondary | ICD-10-CM

## 2019-06-17 DIAGNOSIS — R2689 Other abnormalities of gait and mobility: Secondary | ICD-10-CM | POA: Diagnosis not present

## 2019-06-17 DIAGNOSIS — R293 Abnormal posture: Secondary | ICD-10-CM

## 2019-06-17 DIAGNOSIS — R600 Localized edema: Secondary | ICD-10-CM

## 2019-06-17 DIAGNOSIS — G8929 Other chronic pain: Secondary | ICD-10-CM

## 2019-06-17 DIAGNOSIS — M6281 Muscle weakness (generalized): Secondary | ICD-10-CM

## 2019-06-17 DIAGNOSIS — R2681 Unsteadiness on feet: Secondary | ICD-10-CM

## 2019-06-17 NOTE — Therapy (Signed)
[facility] [address], [place], [place], [address] Phone: [phone]   Fax:  [phone]  Physical Therapy Treatment  Patient Details  Name: Sharon [name] MRN: [id number] Date of Birth: 1965/10/27 Referring Provider (PT): Jean Rosenthal, MD   Encounter Date: 06/17/2019  PT End of Session - 06/17/19 1623    Visit Number  6    Number of Visits  19    Date for PT Re-Evaluation  07/11/19    Authorization Type  Medcost    Authorization Time Period  MET deductible,  100% covered for 2020    PT Start Time  1532    PT Stop Time  1628    PT Time Calculation (min)  56 min    Activity Tolerance  Patient tolerated treatment well    Behavior During Therapy  [facility] for tasks assessed/performed       Past Medical History:  Diagnosis Date  . Allergy   . Anal fissure   . Anemia    borderline  . Anxiety   . Arthritis   . Blood clot in vein    left leg  . Detached retina    BOTH SCLERA BUCKLE BOTH EYES  . DVT (deep venous thrombosis) (HCC)    left leg  . Endometriosis   . Family history of adverse reaction to anesthesia    DAUGHTER POST OP PONV  . GERD (gastroesophageal reflux disease)   . Glaucoma    RIGHT WORST THAN LEFT  . Heart murmur    "caused by anxiety"   . History of hemorrhoids   . History of kidney stones   . Hypertension   . IBS (irritable bowel syndrome)   . Perforated eardrum, right    SMALL HOLE  . PONV (postoperative nausea and vomiting)   . Pulmonary embolism ([place]) 6 YRS AGO    Past Surgical History:  Procedure Laterality Date  . ABDOMINAL HYSTERECTOMY    . CATARACT EXTRACTION Bilateral 2015  . COLONOSCOPY    . CYSTOSCOPY W/ URETERAL STENT PLACEMENT Right 05/23/2017   Procedure: CYSTOSCOPY WITH RETROGRADE PYELOGRAM/URETERAL RIGHT STENT PLACEMENT;  Surgeon: Bjorn Loser, MD;  Location: [facility];  Service: Urology;  Laterality: Right;  . CYSTOSCOPY W/ URETERAL STENT PLACEMENT  Right 06/04/2017   Procedure: CYSTOSCOPY WITHRIGHT RETROGRADE PYELOGRAMRIGHT /URETEREOSCOPY  STENT PLACEMENT;  Surgeon: Lucas Mallow, MD;  Location: [facility];  Service: Urology;  Laterality: Right;  . ENDOMETRIAL ABLATION    . EYE SURGERY Bilateral    cataract surgery with lens implants  . [place]  2011  . INCONTINENCE SURGERY    . INNER EAR SURGERY     X 2  . KNEE SURGERY    . RECTAL PROLAPSE REPAIR    . RETINAL DETACHMENT SURGERY Bilateral 2000  . RETINAL DETACHMENT SURGERY Bilateral   . TOTAL KNEE ARTHROPLASTY Left 04/29/2019  . TOTAL KNEE ARTHROPLASTY Left 04/29/2019   Procedure: LEFT TOTAL KNEE ARTHROPLASTY;  Surgeon: Mcarthur Rossetti, MD;  Location: [facility];  Service: Orthopedics;  Laterality: Left;  . TUBAL LIGATION    . VEIN SURGERY Left 04/2010    There were no vitals filed for this visit.  Subjective Assessment - 06/17/19 1543    Subjective  Pt states she heard 5 pops this week.  States she will go back to see the MD on the 7th. States she has been working on bending and "busted the blood vessels" in the ankle.  [facility] PT Assessment - 06/17/19 0001      AROM   Left Knee Flexion  100   post session                  [facility] Adult PT Treatment/Exercise - 06/17/19 0001      Knee/Hip Exercises: Stretches   Other Knee/Hip Stretches  knee flexion AAROM with sheet      Knee/Hip Exercises: Aerobic   Stationary Bike  no work load - cues to go fwd/back for ROM - able to make full revolution with toe pointed during the last 3 min - 10 min total      Knee/Hip Exercises: Seated   Long Arc Quad  AROM;Strengthening;Left;2 sets;10 reps      Knee/Hip Exercises: Prone   Hamstring Curl  10 reps;3 seconds      Vasopneumatic   Number Minutes Vasopneumatic   15 minutes    Vasopnuematic Location   Knee    Vasopneumatic Pressure  Medium    Vasopneumatic Temperature   3 flakes      Manual Therapy   Joint Mobilization   prone PA tibfib mob -                   PT Long Term Goals - 06/17/19 1605      PT LONG TERM GOAL #1   Title  PROM Left knee flexion 120 degrees    Baseline  intial goal of 90 deg met - 100 post treatment 06/17/19    Status  Revised      PT LONG TERM GOAL #2   Title  PROM Left knee extension -10    Status  On-going      PT LONG TERM GOAL #3   Title  Single leg stance LLE >/= 5 sec    Status  On-going            Plan - 06/17/19 1634    Clinical Impression Statement  Pt demonstrates excellent progress today.  She was able to get full revolutions on recumbant bike with toe pointed.  Supine knee flexion AAROM with min assist from PT was 100 degrees after the warm up . Pt was very pleased with her progress today.  She continues to need quad strength and has significant lag,but knee extension appears to be close to neutral in prone with gravity assisting.  Pt will benefit from skilled PT to continue to work towards functional goals.    PT Treatment/Interventions  ADLs/Self Care Home Management;Electrical Stimulation;Moist Heat;DME Instruction;Gait training;Stair training;Functional mobility training;Therapeutic activities;Therapeutic exercise;Balance training;Neuromuscular re-education;Patient/family education;Manual techniques;Scar mobilization;Compression bandaging;Passive range of motion;Dry needling;Taping;Vasopneumatic Device;Joint Manipulations    PT Next Visit Plan  knee flexion and ext, TKE, quad strength, sit to stand, weight shifting with quad activation, AAROM, bike for warm up and ROM    PT Home Exercise Plan  [facility]    Consulted and Agree with Plan of Care  Patient       Patient will benefit from skilled therapeutic intervention in order to improve the following deficits and impairments:  Abnormal gait, Decreased activity tolerance, Decreased balance, Decreased endurance, Decreased mobility, Decreased range of motion, Decreased skin integrity, Decreased scar  mobility, Decreased strength, Increased edema, Impaired flexibility, Postural dysfunction, Pain  Visit Diagnosis: Other abnormalities of gait and mobility  Unsteadiness on feet  Abnormal posture  Muscle weakness  Stiffness of left knee, not elsewhere classified  Localized edema  Chronic pain of left knee     Problem List Patient  Active Problem List   Diagnosis Date Noted  . Status post total left knee replacement 04/29/2019  . Impingement syndrome of right shoulder 10/28/2018  . Chronic pain of left knee 03/27/2018  . Chronic right shoulder pain 03/27/2018  . Cervicalgia 03/27/2018  . Exposure of implanted vaginal mesh ([place]) 03/06/2018  . Outlet dysfunction constipation 03/06/2018  . Status post arthroscopy of left knee 08/06/2017  . Unilateral primary osteoarthritis, left knee 08/06/2017  . Tremor 07/10/2017  . Other meniscus derangements, posterior horn of medial meniscus, left knee 06/28/2017  . Ureteral stone 05/23/2017  . History of sepsis 05/23/2017  . Acute pain of left knee 04/09/2017  . Trochanteric bursitis, right hip 09/27/2016  . GAD (generalized anxiety disorder) 05/31/2014  . Rectocele 08/21/2011  . Uterovaginal prolapse 08/21/2011  . Dyspnea 10/13/2010  . Hypertension 10/13/2010    Jule Ser, PT 06/17/2019, 4:38 PM   [facility] [address], [place], [place], [address] Phone: [phone]   Fax:  [phone]  Name: Jannetta Massey MRN: [id number] Date of Birth: [birth year]

## 2019-06-18 ENCOUNTER — Encounter: Payer: Self-pay | Admitting: Physical Therapy

## 2019-06-18 ENCOUNTER — Other Ambulatory Visit: Payer: Self-pay

## 2019-06-18 ENCOUNTER — Ambulatory Visit: Payer: No Typology Code available for payment source | Admitting: Physical Therapy

## 2019-06-18 DIAGNOSIS — M25662 Stiffness of left knee, not elsewhere classified: Secondary | ICD-10-CM

## 2019-06-18 DIAGNOSIS — R2681 Unsteadiness on feet: Secondary | ICD-10-CM

## 2019-06-18 DIAGNOSIS — M6281 Muscle weakness (generalized): Secondary | ICD-10-CM

## 2019-06-18 DIAGNOSIS — R600 Localized edema: Secondary | ICD-10-CM

## 2019-06-18 DIAGNOSIS — R2689 Other abnormalities of gait and mobility: Secondary | ICD-10-CM

## 2019-06-18 DIAGNOSIS — G8929 Other chronic pain: Secondary | ICD-10-CM

## 2019-06-18 DIAGNOSIS — R293 Abnormal posture: Secondary | ICD-10-CM

## 2019-06-18 NOTE — Therapy (Signed)
[facility] [address] [place], [place], [address] Phone: [phone]   Fax:  [phone]  Physical Therapy Treatment  Patient Details  Name: Sharon [name] MRN: [id number] Date of Birth: [birth year] Referring Provider (PT): Jean Rosenthal, MD   Encounter Date: 06/18/2019  PT End of Session - 06/18/19 1408    Visit Number  7    Number of Visits  19    Date for PT Re-Evaluation  07/11/19    Authorization Type  Medcost    Authorization Time Period  MET deductible,  100% covered for 2020    PT Start Time  1400    PT Stop Time  1455    PT Time Calculation (min)  55 min    Activity Tolerance  Patient tolerated treatment well    Behavior During Therapy  [facility] for tasks assessed/performed       Past Medical History:  Diagnosis Date  . Allergy   . Anal fissure   . Anemia    borderline  . Anxiety   . Arthritis   . Blood clot in vein    left leg  . Detached retina    BOTH SCLERA BUCKLE BOTH EYES  . DVT (deep venous thrombosis) (HCC)    left leg  . Endometriosis   . Family history of adverse reaction to anesthesia    DAUGHTER POST OP PONV  . GERD (gastroesophageal reflux disease)   . Glaucoma    RIGHT WORST THAN LEFT  . Heart murmur    "caused by anxiety"   . History of hemorrhoids   . History of kidney stones   . Hypertension   . IBS (irritable bowel syndrome)   . Perforated eardrum, right    SMALL HOLE  . PONV (postoperative nausea and vomiting)   . Pulmonary embolism ([place]) 6 YRS AGO    Past Surgical History:  Procedure Laterality Date  . ABDOMINAL HYSTERECTOMY    . CATARACT EXTRACTION Bilateral 2015  . COLONOSCOPY    . CYSTOSCOPY W/ URETERAL STENT PLACEMENT Right 05/23/2017   Procedure: CYSTOSCOPY WITH RETROGRADE PYELOGRAM/URETERAL RIGHT STENT PLACEMENT;  Surgeon: Bjorn Loser, MD;  [facility];  Service: Urology;  Laterality: Right;  . CYSTOSCOPY W/ URETERAL STENT PLACEMENT  Right 06/04/2017   Procedure: CYSTOSCOPY WITHRIGHT RETROGRADE PYELOGRAMRIGHT /URETEREOSCOPY  STENT PLACEMENT;  Surgeon: Lucas Mallow, MD;  Location: [facility];  Service: Urology;  Laterality: Right;  . ENDOMETRIAL ABLATION    . EYE SURGERY Bilateral    cataract surgery with lens implants  . [place]  2011  . INCONTINENCE SURGERY    . INNER EAR SURGERY     X 2  . KNEE SURGERY    . RECTAL PROLAPSE REPAIR    . RETINAL DETACHMENT SURGERY Bilateral 2000  . RETINAL DETACHMENT SURGERY Bilateral   . TOTAL KNEE ARTHROPLASTY Left 04/29/2019  . TOTAL KNEE ARTHROPLASTY Left 04/29/2019   Procedure: LEFT TOTAL KNEE ARTHROPLASTY;  Surgeon: Mcarthur Rossetti, MD;  Location: [place];  Service: Orthopedics;  Laterality: Left;  . TUBAL LIGATION    . VEIN SURGERY Left 04/2010    There were no vitals filed for this visit.  Subjective Assessment - 06/18/19 1421    Subjective  Pt states she felt better after yesterday.  She reports she took pain meds before coming and her pain is under control.  She reports she is only taking pain meds before her exercises and she is taking one  before bed.    Pertinent History  right shoulder impingement, cervicalgia, right Trochanteric bursitis, dyspnea, HTN,    Patient Stated Goals  To walk without cane or pain.    Currently in Pain?  No/denies         [facility] PT Assessment - 06/18/19 0001      AROM   Left Knee Flexion  95   post treatment                  [facility] Adult PT Treatment/Exercise - 06/18/19 0001      Knee/Hip Exercises: Aerobic   Stationary Bike  no work load - cues to go fwd/back for ROM - able to make full revolution with toe pointed - 10 min total      Knee/Hip Exercises: Machines for Strengthening   Cybex Leg Press  Lt LE 30 lb (min assist to get started from Rt LE) -       Knee/Hip Exercises: Standing   Terminal Knee Extension  Strengthening;Left;20 reps;Theraband    Theraband Level (Terminal  Knee Extension)  Level 3 (Green)      Knee/Hip Exercises: Seated   Long Arc Quad  AROM;Strengthening;Left;2 sets;10 reps    Long Arc Quad Weight  2 lbs.      Knee/Hip Exercises: Supine   Knee Flexion  AAROM;Left;5 reps   10 sec hold - last one with PT overpressure                 PT Long Term Goals - 06/17/19 1605      PT LONG TERM GOAL #1   Title  PROM Left knee flexion 120 degrees    Baseline  intial goal of 90 deg met - 100 post treatment 06/17/19    Status  Revised      PT LONG TERM GOAL #2   Title  PROM Left knee extension -10    Status  On-going      PT LONG TERM GOAL #3   Title  Single leg stance LLE >/= 5 sec    Status  On-going            Plan - 06/18/19 1447    Clinical Impression Statement  Pt continues to make progress with overall ROM even though today was 5 degrees less that previous session.  Pt progressed to leg press and able  to do 30 lb single leg with min assist from Rt LE.  Pt will continue to benefit from skilled PT as she is unable to strengthen and get the same progress with her ROM necessary to regain maximum function on her own at this time.    PT Treatment/Interventions  ADLs/Self Care Home Management;Electrical Stimulation;Moist Heat;DME Instruction;Gait training;Stair training;Functional mobility training;Therapeutic activities;Therapeutic exercise;Balance training;Neuromuscular re-education;Patient/family education;Manual techniques;Scar mobilization;Compression bandaging;Passive range of motion;Dry needling;Taping;Vasopneumatic Device;Joint Manipulations    PT Next Visit Plan  knee flexion and ext, TKE, quad strength, sit to stand, weight shifting with quad activation, AAROM, bike for warm up and ROM    PT Home Exercise Plan  [facility]    Consulted and Agree with Plan of Care  Patient       Patient will benefit from skilled therapeutic intervention in order to improve the following deficits and impairments:  Abnormal gait, Decreased  activity tolerance, Decreased balance, Decreased endurance, Decreased mobility, Decreased range of motion, Decreased skin integrity, Decreased scar mobility, Decreased strength, Increased edema, Impaired flexibility, Postural dysfunction, Pain  Visit Diagnosis: Other abnormalities of gait and mobility  Unsteadiness  on feet  Abnormal posture  Muscle weakness  Stiffness of left knee, not elsewhere classified  Localized edema  Chronic pain of left knee     Problem List Patient Active Problem List   Diagnosis Date Noted  . Status post total left knee replacement 04/29/2019  . Impingement syndrome of right shoulder 10/28/2018  . Chronic pain of left knee 03/27/2018  . Chronic right shoulder pain 03/27/2018  . Cervicalgia 03/27/2018  . Exposure of implanted vaginal mesh ([place]) 03/06/2018  . Outlet dysfunction constipation 03/06/2018  . Status post arthroscopy of left knee 08/06/2017  . Unilateral primary osteoarthritis, left knee 08/06/2017  . Tremor 07/10/2017  . Other meniscus derangements, posterior horn of medial meniscus, left knee 06/28/2017  . Ureteral stone 05/23/2017  . History of sepsis 05/23/2017  . Acute pain of left knee 04/09/2017  . Trochanteric bursitis, right hip 09/27/2016  . GAD (generalized anxiety disorder) 05/31/2014  . Rectocele 08/21/2011  . Uterovaginal prolapse 08/21/2011  . Dyspnea 10/13/2010  . Hypertension 10/13/2010    Jule Ser, PT 06/18/2019, 2:51 PM  [facility] [address] [place], [place], [address] Phone: [phone]   Fax:  [phone]  Name: Sharon [name] MRN: [id number] Date of Birth: 1966-03-15

## 2019-06-19 ENCOUNTER — Encounter: Payer: No Typology Code available for payment source | Admitting: Physical Therapy

## 2019-06-22 ENCOUNTER — Other Ambulatory Visit: Payer: Self-pay | Admitting: Neurology

## 2019-06-23 ENCOUNTER — Other Ambulatory Visit: Payer: Self-pay

## 2019-06-23 ENCOUNTER — Encounter: Payer: Self-pay | Admitting: Physical Therapy

## 2019-06-23 ENCOUNTER — Ambulatory Visit: Payer: PRIVATE HEALTH INSURANCE | Attending: Orthopaedic Surgery | Admitting: Physical Therapy

## 2019-06-23 DIAGNOSIS — M6281 Muscle weakness (generalized): Secondary | ICD-10-CM | POA: Diagnosis present

## 2019-06-23 DIAGNOSIS — R2689 Other abnormalities of gait and mobility: Secondary | ICD-10-CM | POA: Diagnosis present

## 2019-06-23 DIAGNOSIS — R293 Abnormal posture: Secondary | ICD-10-CM | POA: Insufficient documentation

## 2019-06-23 DIAGNOSIS — M25662 Stiffness of left knee, not elsewhere classified: Secondary | ICD-10-CM | POA: Insufficient documentation

## 2019-06-23 DIAGNOSIS — G8929 Other chronic pain: Secondary | ICD-10-CM | POA: Insufficient documentation

## 2019-06-23 DIAGNOSIS — R600 Localized edema: Secondary | ICD-10-CM

## 2019-06-23 DIAGNOSIS — R2681 Unsteadiness on feet: Secondary | ICD-10-CM

## 2019-06-23 DIAGNOSIS — M25562 Pain in left knee: Secondary | ICD-10-CM | POA: Diagnosis present

## 2019-06-23 NOTE — Therapy (Signed)
[facility] [address] [place], [place], [address] Phone: [phone]   Fax:  [phone]  Physical Therapy Treatment  Patient Details  Name: Sharon [name] MRN: [id number] Date of Birth: 1966/05/09 Referring Provider (PT): Jean Rosenthal, MD   Encounter Date: 06/23/2019  PT End of Session - 06/23/19 1719    Visit Number  8    Date for PT Re-Evaluation  07/11/19    Authorization Type  Medcost - 30 visits beginning 2021 - track visits below    Authorization Time Period  MET deductible,  100% covered for 2020    Authorization - Visit Number  1   starting over in the new year   Authorization - Number of Visits  30    PT Start Time  1530    PT Stop Time  1620    PT Time Calculation (min)  50 min    Activity Tolerance  Patient tolerated treatment well    Behavior During Therapy  [facility] for tasks assessed/performed       Past Medical History:  Diagnosis Date  . Allergy   . Anal fissure   . Anemia    borderline  . Anxiety   . Arthritis   . Blood clot in vein    left leg  . Detached retina    BOTH SCLERA BUCKLE BOTH EYES  . DVT (deep venous thrombosis) (HCC)    left leg  . Endometriosis   . Family history of adverse reaction to anesthesia    DAUGHTER POST OP PONV  . GERD (gastroesophageal reflux disease)   . Glaucoma    RIGHT WORST THAN LEFT  . Heart murmur    "caused by anxiety"   . History of hemorrhoids   . History of kidney stones   . Hypertension   . IBS (irritable bowel syndrome)   . Perforated eardrum, right    SMALL HOLE  . PONV (postoperative nausea and vomiting)   . Pulmonary embolism ([place]) 6 YRS AGO    Past Surgical History:  Procedure Laterality Date  . ABDOMINAL HYSTERECTOMY    . CATARACT EXTRACTION Bilateral 2015  . COLONOSCOPY    . CYSTOSCOPY W/ URETERAL STENT PLACEMENT Right 05/23/2017   Procedure: CYSTOSCOPY WITH RETROGRADE PYELOGRAM/URETERAL RIGHT STENT PLACEMENT;   Surgeon: Bjorn Loser, MD;  [facility];  Service: Urology;  Laterality: Right;  . CYSTOSCOPY W/ URETERAL STENT PLACEMENT Right 06/04/2017   Procedure: CYSTOSCOPY WITHRIGHT RETROGRADE PYELOGRAMRIGHT /URETEREOSCOPY  STENT PLACEMENT;  Surgeon: Lucas Mallow, MD;  Location[facility];  Service: Urology;  Laterality: Right;  . ENDOMETRIAL ABLATION    . EYE SURGERY Bilateral    cataract surgery with lens implants  . [place]  2011  . INCONTINENCE SURGERY    . INNER EAR SURGERY     X 2  . KNEE SURGERY    . RECTAL PROLAPSE REPAIR    . RETINAL DETACHMENT SURGERY Bilateral 2000  . RETINAL DETACHMENT SURGERY Bilateral   . TOTAL KNEE ARTHROPLASTY Left 04/29/2019  . TOTAL KNEE ARTHROPLASTY Left 04/29/2019   Procedure: LEFT TOTAL KNEE ARTHROPLASTY;  Surgeon: Mcarthur Rossetti, MD;  Location: [facility];  Service: Orthopedics;  Laterality: Left;  . TUBAL LIGATION    . VEIN SURGERY Left 04/2010    There were no vitals filed for this visit.  Subjective Assessment - 06/23/19 1718    Subjective  I have been pushing it as much as I can.  Patient Stated Goals  To walk without cane or pain.    Currently in Pain?  No/denies    Multiple Pain Sites  No                       [facility] Adult PT Treatment/Exercise - 06/23/19 0001      Knee/Hip Exercises: Aerobic   Stationary Bike  L0 x 8 min for ROM      Knee/Hip Exercises: Machines for Strengthening   Cybex Leg Press  Lt LE 40 lb (min assist from Rt LE) - 2 x 20    seat 8     Knee/Hip Exercises: Standing   Forward Step Up  Left;20 reps;Hand Hold: 1;Step Height: 6"   did several with SPC and no railings     Knee/Hip Exercises: Supine   Quad Sets Limitations  towel roll under ankle - cues to activate quad and not glutes - 10x 5 sec      Vasopneumatic   Number Minutes Vasopneumatic   10 minutes    Vasopnuematic Location   Knee    Vasopneumatic Pressure  Medium    Vasopneumatic Temperature    3 flakes      Manual Therapy   Soft tissue mobilization  scar massage along surgical incision   passive ROM knee flexion and ext in supine                 PT Long Term Goals - 06/17/19 1605      PT LONG TERM GOAL #1   Title  PROM Left knee flexion 120 degrees    Baseline  intial goal of 90 deg met - 100 post treatment 06/17/19    Status  Revised      PT LONG TERM GOAL #2   Title  PROM Left knee extension -10    Status  On-going      PT LONG TERM GOAL #3   Title  Single leg stance LLE >/= 5 sec    Status  On-going            Plan - 06/23/19 1713    Clinical Impression Statement  Pt was able to get 97 deg of knee flexion today.  She is still lacking knee extension and is needs cues to not compensate with glutes when doing quad sets.  Pt was able to progress with increased weight on leg press today.  She also demonstrates step up without using railings but she does need SPC for balance.  Pt will benefit from skilled PT to agressively work on ROM.  Pt will see the MD after her next PT visist to assess if she needs to be manipulated.    PT Treatment/Interventions  ADLs/Self Care Home Management;Electrical Stimulation;Moist Heat;DME Instruction;Gait training;Stair training;Functional mobility training;Therapeutic activities;Therapeutic exercise;Balance training;Neuromuscular re-education;Patient/family education;Manual techniques;Scar mobilization;Compression bandaging;Passive range of motion;Dry needling;Taping;Vasopneumatic Device;Joint Manipulations    PT Next Visit Plan  report ROM and send note to MD for updated progress; ROM and quad strength; cues for knee flex and heel strike with gait and steps to reduce hip hiking    PT Home Exercise Plan  [facility]    Consulted and Agree with Plan of Care  Patient       Patient will benefit from skilled therapeutic intervention in order to improve the following deficits and impairments:  Abnormal gait, Decreased activity  tolerance, Decreased balance, Decreased endurance, Decreased mobility, Decreased range of motion, Decreased skin integrity, Decreased scar mobility, Decreased strength, Increased  edema, Impaired flexibility, Postural dysfunction, Pain  Visit Diagnosis: Unsteadiness on feet  Other abnormalities of gait and mobility  Abnormal posture  Muscle weakness  Stiffness of left knee, not elsewhere classified  Localized edema  Chronic pain of left knee     Problem List Patient Active Problem List   Diagnosis Date Noted  . Status post total left knee replacement 04/29/2019  . Impingement syndrome of right shoulder 10/28/2018  . Chronic pain of left knee 03/27/2018  . Chronic right shoulder pain 03/27/2018  . Cervicalgia 03/27/2018  . Exposure of implanted vaginal mesh ([place]) 03/06/2018  . Outlet dysfunction constipation 03/06/2018  . Status post arthroscopy of left knee 08/06/2017  . Unilateral primary osteoarthritis, left knee 08/06/2017  . Tremor 07/10/2017  . Other meniscus derangements, posterior horn of medial meniscus, left knee 06/28/2017  . Ureteral stone 05/23/2017  . History of sepsis 05/23/2017  . Acute pain of left knee 04/09/2017  . Trochanteric bursitis, right hip 09/27/2016  . GAD (generalized anxiety disorder) 05/31/2014  . Rectocele 08/21/2011  . Uterovaginal prolapse 08/21/2011  . Dyspnea 10/13/2010  . Hypertension 10/13/2010    Jule Ser, PT 06/23/2019, 5:25 PM  [facility] [address] [place], [place], [address] Phone: [phone]   Fax:  [phone]  Name: Hadassa Cermak MRN: [id number] Date of Birth: 10/02/1965

## 2019-06-24 ENCOUNTER — Encounter: Payer: No Typology Code available for payment source | Admitting: Physical Therapy

## 2019-06-24 ENCOUNTER — Telehealth: Payer: Self-pay | Admitting: Orthopaedic Surgery

## 2019-06-24 MED ORDER — OXYCODONE HCL 5 MG PO TABS: 5.0000 mg | ORAL_TABLET | Freq: Four times a day (QID) | ORAL | 0 refills | Status: DC | PRN

## 2019-06-24 MED ORDER — METHOCARBAMOL 500 MG PO TABS: 500.0000 mg | ORAL_TABLET | Freq: Four times a day (QID) | ORAL | 1 refills | Status: DC | PRN

## 2019-06-24 NOTE — Telephone Encounter (Signed)
Please advise 

## 2019-06-24 NOTE — Telephone Encounter (Signed)
Patient called. She is requesting a refill on her oxycodone and a prescription for a muscle relaxer.   Call back: [phone]

## 2019-06-25 ENCOUNTER — Other Ambulatory Visit: Payer: Self-pay

## 2019-06-25 ENCOUNTER — Encounter: Payer: Self-pay | Admitting: Physical Therapy

## 2019-06-25 ENCOUNTER — Ambulatory Visit: Payer: PRIVATE HEALTH INSURANCE | Admitting: Physical Therapy

## 2019-06-25 DIAGNOSIS — M6281 Muscle weakness (generalized): Secondary | ICD-10-CM

## 2019-06-25 DIAGNOSIS — R2681 Unsteadiness on feet: Secondary | ICD-10-CM

## 2019-06-25 DIAGNOSIS — M25662 Stiffness of left knee, not elsewhere classified: Secondary | ICD-10-CM

## 2019-06-25 DIAGNOSIS — R600 Localized edema: Secondary | ICD-10-CM

## 2019-06-25 DIAGNOSIS — R293 Abnormal posture: Secondary | ICD-10-CM

## 2019-06-25 DIAGNOSIS — R2689 Other abnormalities of gait and mobility: Secondary | ICD-10-CM

## 2019-06-25 DIAGNOSIS — G8929 Other chronic pain: Secondary | ICD-10-CM

## 2019-06-25 NOTE — Therapy (Signed)
[facility] [address], [place], [place], [address] Phone: [phone]   Fax:  [phone]  Physical Therapy Treatment  Patient Details  Name: Sharon [name] MRN: [id number] Date of Birth: 22-Sep-1965 Referring Provider (PT): Jean Rosenthal, MD   Encounter Date: 06/25/2019  PT End of Session - 06/25/19 1408    Visit Number  9    Number of Visits  71    Authorization Type  [employer] - 30 visits beginning 2021 - track visits below    Authorization Time Period  MET deductible,  100% covered for 2020    Authorization - Visit Number  2    Authorization - Number of Visits  30    PT Start Time  7897    PT Stop Time  1505    PT Time Calculation (min)  57 min    Activity Tolerance  Patient tolerated treatment well    Behavior During Therapy  [facility] for tasks assessed/performed       Past Medical History:  Diagnosis Date  . Allergy   . Anal fissure   . Anemia    borderline  . Anxiety   . Arthritis   . Blood clot in vein    left leg  . Detached retina    BOTH SCLERA BUCKLE BOTH EYES  . DVT (deep venous thrombosis) (HCC)    left leg  . Endometriosis   . Family history of adverse reaction to anesthesia    DAUGHTER POST OP PONV  . GERD (gastroesophageal reflux disease)   . Glaucoma    RIGHT WORST THAN LEFT  . Heart murmur    "caused by anxiety"   . History of hemorrhoids   . History of kidney stones   . Hypertension   . IBS (irritable bowel syndrome)   . Perforated eardrum, right    SMALL HOLE  . PONV (postoperative nausea and vomiting)   . Pulmonary embolism ([place]) 6 YRS AGO    Past Surgical History:  Procedure Laterality Date  . ABDOMINAL HYSTERECTOMY    . CATARACT EXTRACTION Bilateral 2015  . COLONOSCOPY    . CYSTOSCOPY W/ URETERAL STENT PLACEMENT Right 05/23/2017   Procedure: CYSTOSCOPY WITH RETROGRADE PYELOGRAM/URETERAL RIGHT STENT PLACEMENT;  Surgeon: Bjorn Loser, MD;  Location: [facility];   Service: Urology;  Laterality: Right;  . CYSTOSCOPY W/ URETERAL STENT PLACEMENT Right 06/04/2017   Procedure: CYSTOSCOPY WITHRIGHT RETROGRADE PYELOGRAMRIGHT /URETEREOSCOPY  STENT PLACEMENT;  Surgeon: Lucas Mallow, MD;  Location[facility];  Service: Urology;  Laterality: Right;  . ENDOMETRIAL ABLATION    . EYE SURGERY Bilateral    cataract surgery with lens implants  . [place]  2011  . INCONTINENCE SURGERY    . INNER EAR SURGERY     X 2  . KNEE SURGERY    . RECTAL PROLAPSE REPAIR    . RETINAL DETACHMENT SURGERY Bilateral 2000  . RETINAL DETACHMENT SURGERY Bilateral   . TOTAL KNEE ARTHROPLASTY Left 04/29/2019  . TOTAL KNEE ARTHROPLASTY Left 04/29/2019   Procedure: LEFT TOTAL KNEE ARTHROPLASTY;  Surgeon: Mcarthur Rossetti, MD;  Location: [facility];  Service: Orthopedics;  Laterality: Left;  . TUBAL LIGATION    . VEIN SURGERY Left 04/2010    There were no vitals filed for this visit.  Subjective Assessment - 06/25/19 1412    Subjective  I see the MD tomorrow.    Currently in Pain?  --   reports no pain but "tightness."  Multiple Pain Sites  No         [facility] PT Assessment - 06/25/19 0001      AROM   Left Knee Flexion  93      PROM   Left Knee Flexion  103                   [facility] Adult PT Treatment/Exercise - 06/25/19 0001      Knee/Hip Exercises: Aerobic   Stationary Bike  Full foward ROM x 6 min slow, then we move the seat up a little closer for 2 min: 2 sets of that      Knee/Hip Exercises: Machines for Strengthening   Cybex Leg Press  Seat 7: LTLE eccentrics 40# 2x10      Knee/Hip Exercises: Standing   Terminal Knee Extension  Strengthening;Left;20 reps;Theraband    Theraband Level (Terminal Knee Extension)  Level 3 (Green)   add to HEP   Terminal Knee Extension Limitations  Tapping at quad      Vasopneumatic   Number Minutes Vasopneumatic   10 minutes    Vasopnuematic Location   Knee    Vasopneumatic Pressure   Medium    Vasopneumatic Temperature   3 flakes      Manual Therapy   Manual Therapy  Passive ROM    Passive ROM  Knee flexion with end range stretch             PT Education - 06/25/19 1618    Education Details  green band TKE for HEP    Person(s) Educated  Patient    Methods  Explanation;Demonstration;Tactile cues;Verbal cues;Handout    Comprehension  Returned demonstration;Verbalized understanding          PT Long Term Goals - 06/17/19 1605      PT LONG TERM GOAL #1   Title  PROM Left knee flexion 120 degrees    Baseline  intial goal of 90 deg met - 100 post treatment 06/17/19    Status  Revised      PT LONG TERM GOAL #2   Title  PROM Left knee extension -10    Status  On-going      PT LONG TERM GOAL #3   Title  Single leg stance LLE >/= 5 sec    Status  On-going            Plan - 06/25/19 1409    Clinical Impression Statement  Pt arrives with no pain, just a tight feeling in her knee. She reports compliance at home with her stretches and measured 103 degree passive ROM at the end of todays session. She will see MD tomorrow to check on her progress.    Personal Factors and Comorbidities  Comorbidity 3+;Fitness;Past/Current Experience    Comorbidities  right shoulder impingement, cervicalgia, right Trochanteric bursitis, dyspnea, HTN    Examination-Activity Limitations  Bend;Lift;Locomotion Level;Stand;Stairs;Transfers    Examination-Participation Restrictions  Community Activity    Stability/Clinical Decision Making  Stable/Uncomplicated    Rehab Potential  Good    PT Frequency  3x / week    PT Duration  6 weeks    PT Treatment/Interventions  ADLs/Self Care Home Management;Electrical Stimulation;Moist Heat;DME Instruction;Gait training;Stair training;Functional mobility training;Therapeutic activities;Therapeutic exercise;Balance training;Neuromuscular re-education;Patient/family education;Manual techniques;Scar mobilization;Compression bandaging;Passive  range of motion;Dry needling;Taping;Vasopneumatic Device;Joint Manipulations    PT Next Visit Plan  To MD after her next session. ROM and quad activation. Review the TKE ex given for HEP.    PT Home Exercise Plan  [id number]    Consulted and Agree with Plan of Care  Patient       Patient will benefit from skilled therapeutic intervention in order to improve the following deficits and impairments:  Abnormal gait, Decreased activity tolerance, Decreased balance, Decreased endurance, Decreased mobility, Decreased range of motion, Decreased skin integrity, Decreased scar mobility, Decreased strength, Increased edema, Impaired flexibility, Postural dysfunction, Pain  Visit Diagnosis: Unsteadiness on feet  Other abnormalities of gait and mobility  Abnormal posture  Muscle weakness  Stiffness of left knee, not elsewhere classified  Localized edema  Chronic pain of left knee     Problem List Patient Active Problem List   Diagnosis Date Noted  . Status post total left knee replacement 04/29/2019  . Impingement syndrome of right shoulder 10/28/2018  . Chronic pain of left knee 03/27/2018  . Chronic right shoulder pain 03/27/2018  . Cervicalgia 03/27/2018  . Exposure of implanted vaginal mesh ([place]) 03/06/2018  . Outlet dysfunction constipation 03/06/2018  . Status post arthroscopy of left knee 08/06/2017  . Unilateral primary osteoarthritis, left knee 08/06/2017  . Tremor 07/10/2017  . Other meniscus derangements, posterior horn of medial meniscus, left knee 06/28/2017  . Ureteral stone 05/23/2017  . History of sepsis 05/23/2017  . Acute pain of left knee 04/09/2017  . Trochanteric bursitis, right hip 09/27/2016  . GAD (generalized anxiety disorder) 05/31/2014  . Rectocele 08/21/2011  . Uterovaginal prolapse 08/21/2011  . Dyspnea 10/13/2010  . Hypertension 10/13/2010    COCHRAN,JENNIFER, PTA 06/25/2019, 4:25 PM  [facility] [address] [place], [place], [address] Phone: [phone]   Fax:  [phone]  Name: Elinora Weigand MRN: [id number] Date of Birth: May 05, 1966

## 2019-06-26 ENCOUNTER — Encounter: Payer: No Typology Code available for payment source | Admitting: Physical Therapy

## 2019-06-26 ENCOUNTER — Encounter: Payer: Self-pay | Admitting: Physical Therapy

## 2019-06-26 ENCOUNTER — Ambulatory Visit: Payer: PRIVATE HEALTH INSURANCE | Admitting: Physical Therapy

## 2019-06-26 ENCOUNTER — Ambulatory Visit (INDEPENDENT_AMBULATORY_CARE_PROVIDER_SITE_OTHER): Payer: No Typology Code available for payment source | Admitting: Orthopaedic Surgery

## 2019-06-26 ENCOUNTER — Other Ambulatory Visit: Payer: Self-pay

## 2019-06-26 ENCOUNTER — Encounter: Payer: Self-pay | Admitting: Orthopaedic Surgery

## 2019-06-26 DIAGNOSIS — M6281 Muscle weakness (generalized): Secondary | ICD-10-CM

## 2019-06-26 DIAGNOSIS — R2681 Unsteadiness on feet: Secondary | ICD-10-CM | POA: Diagnosis not present

## 2019-06-26 DIAGNOSIS — M25662 Stiffness of left knee, not elsewhere classified: Secondary | ICD-10-CM

## 2019-06-26 DIAGNOSIS — R600 Localized edema: Secondary | ICD-10-CM

## 2019-06-26 DIAGNOSIS — G8929 Other chronic pain: Secondary | ICD-10-CM

## 2019-06-26 DIAGNOSIS — Z96652 Presence of left artificial knee joint: Secondary | ICD-10-CM

## 2019-06-26 DIAGNOSIS — R2689 Other abnormalities of gait and mobility: Secondary | ICD-10-CM

## 2019-06-26 DIAGNOSIS — R293 Abnormal posture: Secondary | ICD-10-CM

## 2019-06-26 NOTE — Therapy (Signed)
[facility] [address] [place], [place], [address] Phone: [phone]   Fax:  [phone]  Physical Therapy Treatment  Patient Details  Name: Elize Pinon MRN: [id number] Date of Birth: 1966-05-17 Referring Provider (PT): Jean Rosenthal, MD   Encounter Date: 06/26/2019  PT End of Session - 06/26/19 1405    Visit Number  10    Number of Visits  19    Date for PT Re-Evaluation  07/11/19    Authorization Type  Med[employer] - 30 visits beginning 2021 - track visits below    Authorization - Visit Number  3    Authorization - Number of Visits  30    PT Start Time  3825    PT Stop Time  1459    PT Time Calculation (min)  54 min    Activity Tolerance  Patient tolerated treatment well    Behavior During Therapy  [facility] for tasks assessed/performed       Past Medical History:  Diagnosis Date  . Allergy   . Anal fissure   . Anemia    borderline  . Anxiety   . Arthritis   . Blood clot in vein    left leg  . Detached retina    BOTH SCLERA BUCKLE BOTH EYES  . DVT (deep venous thrombosis) (HCC)    left leg  . Endometriosis   . Family history of adverse reaction to anesthesia    DAUGHTER POST OP PONV  . GERD (gastroesophageal reflux disease)   . Glaucoma    RIGHT WORST THAN LEFT  . Heart murmur    "caused by anxiety"   . History of hemorrhoids   . History of kidney stones   . Hypertension   . IBS (irritable bowel syndrome)   . Perforated eardrum, right    SMALL HOLE  . PONV (postoperative nausea and vomiting)   . Pulmonary embolism ([place]) 6 YRS AGO    Past Surgical History:  Procedure Laterality Date  . ABDOMINAL HYSTERECTOMY    . CATARACT EXTRACTION Bilateral 2015  . COLONOSCOPY    . CYSTOSCOPY W/ URETERAL STENT PLACEMENT Right 05/23/2017   Procedure: CYSTOSCOPY WITH RETROGRADE PYELOGRAM/URETERAL RIGHT STENT PLACEMENT;  Surgeon: Bjorn Loser, MD;  [facility];  Service: Urology;   Laterality: Right;  . CYSTOSCOPY W/ URETERAL STENT PLACEMENT Right 06/04/2017   Procedure: CYSTOSCOPY WITHRIGHT RETROGRADE PYELOGRAMRIGHT /URETEREOSCOPY  STENT PLACEMENT;  Surgeon: Lucas Mallow, MD;  Location[facility];  Service: Urology;  Laterality: Right;  . ENDOMETRIAL ABLATION    . EYE SURGERY Bilateral    cataract surgery with lens implants  . [place]  2011  . INCONTINENCE SURGERY    . INNER EAR SURGERY     X 2  . KNEE SURGERY    . RECTAL PROLAPSE REPAIR    . RETINAL DETACHMENT SURGERY Bilateral 2000  . RETINAL DETACHMENT SURGERY Bilateral   . TOTAL KNEE ARTHROPLASTY Left 04/29/2019  . TOTAL KNEE ARTHROPLASTY Left 04/29/2019   Procedure: LEFT TOTAL KNEE ARTHROPLASTY;  Surgeon: Mcarthur Rossetti, MD;  Location: [facility];  Service: Orthopedics;  Laterality: Left;  . TUBAL LIGATION    . VEIN SURGERY Left 04/2010    There were no vitals filed for this visit.  Subjective Assessment - 06/26/19 1539    Subjective  Pt states she is in a lot more pain today due to pushing the ROM and doing more resistance on the leg press yesterday.  Pertinent History  right shoulder impingement, cervicalgia, right Trochanteric bursitis, dyspnea, HTN,    Currently in Pain?  Yes   did not report a number        [facility] PT Assessment - 06/26/19 0001      PROM   Left Knee Flexion  97                   [facility] Adult PT Treatment/Exercise - 06/26/19 0001      Knee/Hip Exercises: Aerobic   Nustep  L3 x 10 min quad strength and endurance - PT present for status update      Knee/Hip Exercises: Seated   Long Arc Quad  AAROM    Long Arc Quad Limitations  with flexion stretch in between using slider      Knee/Hip Exercises: Supine   Quad Sets  Strengthening;Left;20 reps   5 sec - towel under knee toassi   Other Supine Knee/Hip Exercises  straight leg raise - 10x      Knee/Hip Exercises: Sidelying   Hip ABduction  Strengthening;Left;10 reps    Hip  ADduction  Strengthening;Left;10 reps      Vasopneumatic   Number Minutes Vasopneumatic   10 minutes    Vasopnuematic Location   Knee    Vasopneumatic Pressure  Medium   bifracated   Vasopneumatic Temperature   3 flakes      Manual Therapy   Passive ROM  Knee flexion with end range stretch                  PT Long Term Goals - 06/26/19 1538      PT LONG TERM GOAL #1   Title  PROM Left knee flexion 120 degrees    Baseline  intial goal of 90 deg met - 100 post treatment 06/17/19    Status  On-going      PT LONG TERM GOAL #2   Title  PROM Left knee extension -10    Status  On-going      PT LONG TERM GOAL #3   Title  Single leg stance LLE >/= 5 sec    Status  On-going            Plan - 06/26/19 1534    Clinical Impression Statement  Pt arrives with complaints of pain today due to feeling like she really pushed it yesterday.  Today's session focused a little more on hip strength.  Pt has made progress overall from 68 deg of knee flexion at eval to 97 today.  Today she was in more pain, she had more motion yesterday when pain was more under control.  She is still lacking knee extension.  Pt will benefit from skilled PT so she can get functional ROM and improved quad strength.    Comorbidities  right shoulder impingement, cervicalgia, right Trochanteric bursitis, dyspnea, HTN    PT Treatment/Interventions  ADLs/Self Care Home Management;Electrical Stimulation;Moist Heat;DME Instruction;Gait training;Stair training;Functional mobility training;Therapeutic activities;Therapeutic exercise;Balance training;Neuromuscular re-education;Patient/family education;Manual techniques;Scar mobilization;Compression bandaging;Passive range of motion;Dry needling;Taping;Vasopneumatic Device;Joint Manipulations    PT Next Visit Plan  f/u on MD visit, ROM and quad activation. Review the TKE ex given for HEP.    PT Home Exercise Plan  [facility]    Consulted and Agree with Plan of Care   Patient       Patient will benefit from skilled therapeutic intervention in order to improve the following deficits and impairments:  Abnormal gait, Decreased activity tolerance, Decreased balance, Decreased endurance,  Decreased mobility, Decreased range of motion, Decreased skin integrity, Decreased scar mobility, Decreased strength, Increased edema, Impaired flexibility, Postural dysfunction, Pain  Visit Diagnosis: Unsteadiness on feet  Other abnormalities of gait and mobility  Abnormal posture  Muscle weakness  Stiffness of left knee, not elsewhere classified  Localized edema  Chronic pain of left knee     Problem List Patient Active Problem List   Diagnosis Date Noted  . Status post total left knee replacement 04/29/2019  . Impingement syndrome of right shoulder 10/28/2018  . Chronic pain of left knee 03/27/2018  . Chronic right shoulder pain 03/27/2018  . Cervicalgia 03/27/2018  . Exposure of implanted vaginal mesh ([place]) 03/06/2018  . Outlet dysfunction constipation 03/06/2018  . Status post arthroscopy of left knee 08/06/2017  . Unilateral primary osteoarthritis, left knee 08/06/2017  . Tremor 07/10/2017  . Other meniscus derangements, posterior horn of medial meniscus, left knee 06/28/2017  . Ureteral stone 05/23/2017  . History of sepsis 05/23/2017  . Acute pain of left knee 04/09/2017  . Trochanteric bursitis, right hip 09/27/2016  . GAD (generalized anxiety disorder) 05/31/2014  . Rectocele 08/21/2011  . Uterovaginal prolapse 08/21/2011  . Dyspnea 10/13/2010  . Hypertension 10/13/2010    Jule Ser, PT 06/26/2019, 3:40 PM  [facility] [address] [place], [place], [address] Phone: [phone]   Fax:  [phone]  Name: Miki Labuda MRN: [id number] Date of Birth: Apr 15, 1966

## 2019-06-26 NOTE — Progress Notes (Signed)
The patient is now 8 weeks status post left total knee arthroplasty.  She is still dealing with significant postoperative knee stiffness.  She is only 54 years old and this can be certainly much more painful surgery for a younger person.  On exam her extension is almost full.  I can flex her to just past 90 degrees.  The notes from therapy stated they have gotten her to 103 degrees passively.  I counseled her about the importance of pushing herself through therapy and she understands this fully.  We are continuing her oxycodone because she cannot tolerate other pain medications.  She understands though that I need to have her off of this by at least 3 months postoperative hopefully.  We will see her back in 2 weeks for repeat exam but no x-rays are needed.  I did inject her right shoulder due to chronic shoulder pain today with a steroid injection.

## 2019-06-30 ENCOUNTER — Ambulatory Visit: Payer: PRIVATE HEALTH INSURANCE | Admitting: Physical Therapy

## 2019-06-30 ENCOUNTER — Encounter: Payer: Self-pay | Admitting: Physical Therapy

## 2019-06-30 ENCOUNTER — Encounter: Payer: No Typology Code available for payment source | Admitting: Physical Therapy

## 2019-06-30 ENCOUNTER — Other Ambulatory Visit: Payer: Self-pay

## 2019-06-30 DIAGNOSIS — R600 Localized edema: Secondary | ICD-10-CM

## 2019-06-30 DIAGNOSIS — R2681 Unsteadiness on feet: Secondary | ICD-10-CM | POA: Diagnosis not present

## 2019-06-30 DIAGNOSIS — M25662 Stiffness of left knee, not elsewhere classified: Secondary | ICD-10-CM

## 2019-06-30 DIAGNOSIS — R293 Abnormal posture: Secondary | ICD-10-CM

## 2019-06-30 DIAGNOSIS — M6281 Muscle weakness (generalized): Secondary | ICD-10-CM

## 2019-06-30 DIAGNOSIS — G8929 Other chronic pain: Secondary | ICD-10-CM

## 2019-06-30 DIAGNOSIS — R2689 Other abnormalities of gait and mobility: Secondary | ICD-10-CM

## 2019-06-30 NOTE — Therapy (Signed)
[facility] [address], [place], [place], [address] Phone: [phone]   Fax:  [phone]  Physical Therapy Treatment  Patient Details  Name: Sharon [name] MRN: [id number] Date of Birth: [birth year] Referring Provider (PT): Jean Rosenthal, MD   Encounter Date: 06/30/2019  PT End of Session - 06/30/19 3532    Visit Number  11    Number of Visits  19    Date for PT Re-Evaluation  07/11/19    Authorization Type  [employer] - 30 visits beginning 2021 - track visits below    Authorization Time Period  MET deductible,  100% covered for 2020    Authorization - Visit Number  4    Authorization - Number of Visits  30    PT Start Time  9924    PT Stop Time  1505    PT Time Calculation (min)  62 min    Activity Tolerance  Patient tolerated treatment well    Behavior During Therapy  [facility] for tasks assessed/performed       Past Medical History:  Diagnosis Date  . Allergy   . Anal fissure   . Anemia    borderline  . Anxiety   . Arthritis   . Blood clot in vein    left leg  . Detached retina    BOTH SCLERA BUCKLE BOTH EYES  . DVT (deep venous thrombosis) (HCC)    left leg  . Endometriosis   . Family history of adverse reaction to anesthesia    DAUGHTER POST OP PONV  . GERD (gastroesophageal reflux disease)   . Glaucoma    RIGHT WORST THAN LEFT  . Heart murmur    "caused by anxiety"   . History of hemorrhoids   . History of kidney stones   . Hypertension   . IBS (irritable bowel syndrome)   . Perforated eardrum, right    SMALL HOLE  . PONV (postoperative nausea and vomiting)   . Pulmonary embolism ([place]) 6 YRS AGO    Past Surgical History:  Procedure Laterality Date  . ABDOMINAL HYSTERECTOMY    . CATARACT EXTRACTION Bilateral 2015  . COLONOSCOPY    . CYSTOSCOPY W/ URETERAL STENT PLACEMENT Right 05/23/2017   Procedure: CYSTOSCOPY WITH RETROGRADE PYELOGRAM/URETERAL RIGHT STENT PLACEMENT;  Surgeon:  Bjorn Loser, MD;  [facility];  Service: Urology;  Laterality: Right;  . CYSTOSCOPY W/ URETERAL STENT PLACEMENT Right 06/04/2017   Procedure: CYSTOSCOPY WITHRIGHT RETROGRADE PYELOGRAMRIGHT /URETEREOSCOPY  STENT PLACEMENT;  Surgeon: Lucas Mallow, MD;  Location[facility];  Service: Urology;  Laterality: Right;  . ENDOMETRIAL ABLATION    . EYE SURGERY Bilateral    cataract surgery with lens implants  . [place]  2011  . INCONTINENCE SURGERY    . INNER EAR SURGERY     X 2  . KNEE SURGERY    . RECTAL PROLAPSE REPAIR    . RETINAL DETACHMENT SURGERY Bilateral 2000  . RETINAL DETACHMENT SURGERY Bilateral   . TOTAL KNEE ARTHROPLASTY Left 04/29/2019  . TOTAL KNEE ARTHROPLASTY Left 04/29/2019   Procedure: LEFT TOTAL KNEE ARTHROPLASTY;  Surgeon: Mcarthur Rossetti, MD;  Location: [place];  Service: Orthopedics;  Laterality: Left;  . TUBAL LIGATION    . VEIN SURGERY Left 04/2010    There were no vitals filed for this visit.  Subjective Assessment - 06/30/19 1407    Subjective  My knee pain is 4-5/10 today. I am working it hard  at home.    Pertinent History  right shoulder impingement, cervicalgia, right Trochanteric bursitis, dyspnea, HTN,    Currently in Pain?  Yes    Pain Score  5     Pain Location  Knee    Pain Orientation  Left    Pain Descriptors / Indicators  Sore    Aggravating Factors   Bending it.    Pain Relieving Factors  meds, ice    Multiple Pain Sites  No         [facility] PT Assessment - 06/30/19 0001      PROM   Left Knee Flexion  102                   [facility] Adult PT Treatment/Exercise - 06/30/19 0001      Knee/Hip Exercises: Aerobic   Nustep  Start first 5 min, L2 , then  stationary bike x 8 min L1       Electrical Stimulation   Electrical Stimulation Location  knee    Electrical Stimulation Action  IFC    Electrical Stimulation Parameters  80-150 HZ    Electrical Stimulation Goals  Pain   Concurrent  with Game Ready     Vasopneumatic   Number Minutes Vasopneumatic   15 minutes    Vasopnuematic Location   Knee    Vasopneumatic Pressure  Medium   bifracated   Vasopneumatic Temperature   3 flakes      Manual Therapy   Passive ROM  Knee flexion with end range stretch in both sitting and in prone/CR in prone   contract relax 4x                 PT Long Term Goals - 06/26/19 1538      PT LONG TERM GOAL #1   Title  PROM Left knee flexion 120 degrees    Baseline  intial goal of 90 deg met - 100 post treatment 06/17/19    Status  On-going      PT LONG TERM GOAL #2   Title  PROM Left knee extension -10    Status  On-going      PT LONG TERM GOAL #3   Title  Single leg stance LLE >/= 5 sec    Status  On-going            Plan - 06/30/19 1522    Clinical Impression Statement  Pt arrives today after seeing MD. No manipulation at this time, he will recheck her in 2 weeks to make sure she continues to make progress. At conclusion of session today pt had 103 degrees of PROM. This was after extensive stretching during the session. Pt tolerated all the stretched well including contract relax techniques. Pt aadded Estim for pain control while she does the Game Ready.    Personal Factors and Comorbidities  Comorbidity 3+;Fitness;Past/Current Experience    Comorbidities  right shoulder impingement, cervicalgia, right Trochanteric bursitis, dyspnea, HTN    Examination-Activity Limitations  Bend;Lift;Locomotion Level;Stand;Stairs;Transfers    Examination-Participation Restrictions  Community Activity    Stability/Clinical Decision Making  Stable/Uncomplicated    Rehab Potential  Good    PT Frequency  3x / week    PT Duration  6 weeks    PT Treatment/Interventions  ADLs/Self Care Home Management;Electrical Stimulation;Moist Heat;DME Instruction;Gait training;Stair training;Functional mobility training;Therapeutic activities;Therapeutic exercise;Balance training;Neuromuscular  re-education;Patient/family education;Manual techniques;Scar mobilization;Compression bandaging;Passive range of motion;Dry needling;Taping;Vasopneumatic Device;Joint Manipulations    PT Next Visit Plan  ROM and quad  activation. Review the TKE ex given for HEP.    PT Home Exercise Plan  [facility]    Consulted and Agree with Plan of Care  Patient       Patient will benefit from skilled therapeutic intervention in order to improve the following deficits and impairments:  Abnormal gait, Decreased activity tolerance, Decreased balance, Decreased endurance, Decreased mobility, Decreased range of motion, Decreased skin integrity, Decreased scar mobility, Decreased strength, Increased edema, Impaired flexibility, Postural dysfunction, Pain  Visit Diagnosis: Unsteadiness on feet  Other abnormalities of gait and mobility  Abnormal posture  Muscle weakness  Stiffness of left knee, not elsewhere classified  Localized edema  Chronic pain of left knee     Problem List Patient Active Problem List   Diagnosis Date Noted  . Status post total left knee replacement 04/29/2019  . Impingement syndrome of right shoulder 10/28/2018  . Chronic pain of left knee 03/27/2018  . Chronic right shoulder pain 03/27/2018  . Cervicalgia 03/27/2018  . Exposure of implanted vaginal mesh ([place]) 03/06/2018  . Outlet dysfunction constipation 03/06/2018  . Status post arthroscopy of left knee 08/06/2017  . Unilateral primary osteoarthritis, left knee 08/06/2017  . Tremor 07/10/2017  . Other meniscus derangements, posterior horn of medial meniscus, left knee 06/28/2017  . Ureteral stone 05/23/2017  . History of sepsis 05/23/2017  . Acute pain of left knee 04/09/2017  . Trochanteric bursitis, right hip 09/27/2016  . GAD (generalized anxiety disorder) 05/31/2014  . Rectocele 08/21/2011  . Uterovaginal prolapse 08/21/2011  . Dyspnea 10/13/2010  . Hypertension 10/13/2010    Sharon [name], PTA 06/30/2019,  3:28 PM  [facility] [address] [place], [place], [address] Phone: [phone]   Fax:  [phone]  Name: Sharon [name] MRN: [id number] Date of Birth: 02-22-1966

## 2019-07-02 ENCOUNTER — Other Ambulatory Visit: Payer: Self-pay

## 2019-07-02 ENCOUNTER — Encounter: Payer: Self-pay | Admitting: Physical Therapy

## 2019-07-02 ENCOUNTER — Ambulatory Visit: Payer: PRIVATE HEALTH INSURANCE | Admitting: Physical Therapy

## 2019-07-02 ENCOUNTER — Encounter: Payer: No Typology Code available for payment source | Admitting: Physical Therapy

## 2019-07-02 DIAGNOSIS — R2681 Unsteadiness on feet: Secondary | ICD-10-CM

## 2019-07-02 DIAGNOSIS — G8929 Other chronic pain: Secondary | ICD-10-CM

## 2019-07-02 DIAGNOSIS — M6281 Muscle weakness (generalized): Secondary | ICD-10-CM

## 2019-07-02 DIAGNOSIS — M25662 Stiffness of left knee, not elsewhere classified: Secondary | ICD-10-CM

## 2019-07-02 DIAGNOSIS — R293 Abnormal posture: Secondary | ICD-10-CM

## 2019-07-02 DIAGNOSIS — R600 Localized edema: Secondary | ICD-10-CM

## 2019-07-02 DIAGNOSIS — R2689 Other abnormalities of gait and mobility: Secondary | ICD-10-CM

## 2019-07-02 DIAGNOSIS — M25562 Pain in left knee: Secondary | ICD-10-CM

## 2019-07-02 NOTE — Therapy (Signed)
[facility] [address] [place], [place], [address] Phone: [phone]   Fax:  [phone]  Physical Therapy Treatment  Patient Details  Name: Sharon [name] MRN: [id number] Date of Birth: 1965/07/11 Referring Provider (PT): Jean Rosenthal, MD   Encounter Date: 07/02/2019  PT End of Session - 07/02/19 1404    Visit Number  12    Number of Visits  19    Date for PT Re-Evaluation  07/11/19    Authorization Type  [employer] - 30 visits beginning 2021 - track visits below    Authorization Time Period  MET deductible,  100% covered for 2020    Authorization - Visit Number  5    Authorization - Number of Visits  30    PT Start Time  9179    PT Stop Time  1503    PT Time Calculation (min)  60 min    Activity Tolerance  Patient tolerated treatment well    Behavior During Therapy  [facility] for tasks assessed/performed       Past Medical History:  Diagnosis Date  . Allergy   . Anal fissure   . Anemia    borderline  . Anxiety   . Arthritis   . Blood clot in vein    left leg  . Detached retina    BOTH SCLERA BUCKLE BOTH EYES  . DVT (deep venous thrombosis) (HCC)    left leg  . Endometriosis   . Family history of adverse reaction to anesthesia    DAUGHTER POST OP PONV  . GERD (gastroesophageal reflux disease)   . Glaucoma    RIGHT WORST THAN LEFT  . Heart murmur    "caused by anxiety"   . History of hemorrhoids   . History of kidney stones   . Hypertension   . IBS (irritable bowel syndrome)   . Perforated eardrum, right    SMALL HOLE  . PONV (postoperative nausea and vomiting)   . Pulmonary embolism ([place]) 6 YRS AGO    Past Surgical History:  Procedure Laterality Date  . ABDOMINAL HYSTERECTOMY    . CATARACT EXTRACTION Bilateral 2015  . COLONOSCOPY    . CYSTOSCOPY W/ URETERAL STENT PLACEMENT Right 05/23/2017   Procedure: CYSTOSCOPY WITH RETROGRADE PYELOGRAM/URETERAL RIGHT STENT PLACEMENT;  Surgeon:  Bjorn Loser, MD;  [facility];  Service: Urology;  Laterality: Right;  . CYSTOSCOPY W/ URETERAL STENT PLACEMENT Right 06/04/2017   Procedure: CYSTOSCOPY WITHRIGHT RETROGRADE PYELOGRAMRIGHT /URETEREOSCOPY  STENT PLACEMENT;  Surgeon: Lucas Mallow, MD;  Location: [facility];  Service: Urology;  Laterality: Right;  . ENDOMETRIAL ABLATION    . EYE SURGERY Bilateral    cataract surgery with lens implants  . [place]  2011  . INCONTINENCE SURGERY    . INNER EAR SURGERY     X 2  . KNEE SURGERY    . RECTAL PROLAPSE REPAIR    . RETINAL DETACHMENT SURGERY Bilateral 2000  . RETINAL DETACHMENT SURGERY Bilateral   . TOTAL KNEE ARTHROPLASTY Left 04/29/2019  . TOTAL KNEE ARTHROPLASTY Left 04/29/2019   Procedure: LEFT TOTAL KNEE ARTHROPLASTY;  Surgeon: Mcarthur Rossetti, MD;  Location: [place];  Service: Orthopedics;  Laterality: Left;  . TUBAL LIGATION    . VEIN SURGERY Left 04/2010    There were no vitals filed for this visit.  Subjective Assessment - 07/02/19 1408    Subjective  I think moving the bike up a little really made my  knee hurt last session.    Pertinent History  right shoulder impingement, cervicalgia, right Trochanteric bursitis, dyspnea, HTN,    Currently in Pain?  Yes    Pain Score  5     Pain Location  Knee    Pain Orientation  Left    Pain Descriptors / Indicators  Sore    Multiple Pain Sites  No         [facility] PT Assessment - 07/02/19 0001      PROM   Left Knee Flexion  104   PTA applying a lot of force                  [facility] Adult PT Treatment/Exercise - 07/02/19 0001      Knee/Hip Exercises: Aerobic   Stationary Bike  ROM slow x 5 min       Knee/Hip Exercises: Seated   Hamstring Curl  Strengthening;Left;2 sets;10 reps   red band     Moist Heat Therapy   Number Minutes Moist Heat  15 Minutes    Moist Heat Location  Knee      Electrical Stimulation   Electrical Stimulation Location  knee     Electrical Stimulation Action  IFC    Electrical Stimulation Parameters  80-150 HZ    Electrical Stimulation Goals  Pain      Vasopneumatic   Number Minutes Vasopneumatic   --    Vasopnuematic Location   --    Vasopneumatic Pressure  --   bifracated   Vasopneumatic Temperature   --      Manual Therapy   Passive ROM  Knee flexion with end range stretch in both sitting and in prone/CR in prone   contract relax 4x                 PT Long Term Goals - 06/26/19 1538      PT LONG TERM GOAL #1   Title  PROM Left knee flexion 120 degrees    Baseline  intial goal of 90 deg met - 100 post treatment 06/17/19    Status  On-going      PT LONG TERM GOAL #2   Title  PROM Left knee extension -10    Status  On-going      PT LONG TERM GOAL #3   Title  Single leg stance LLE >/= 5 sec    Status  On-going            Plan - 07/02/19 1452    Clinical Impression Statement  Pt arrives with moderate knee pain.  Pt tolerated prone stretching fairly well today. In supie we were able to really push PROM to 104 degrees. This was pretty painful for pt. For post session pain pt wanted to try heat with stim to see if this would be more effective than the cold.    Personal Factors and Comorbidities  Comorbidity 3+;Fitness;Past/Current Experience    Comorbidities  right shoulder impingement, cervicalgia, right Trochanteric bursitis, dyspnea, HTN    Examination-Activity Limitations  Bend;Lift;Locomotion Level;Stand;Stairs;Transfers    Examination-Participation Restrictions  Community Activity    Stability/Clinical Decision Making  Stable/Uncomplicated    Rehab Potential  Good    PT Frequency  3x / week    PT Duration  6 weeks    PT Treatment/Interventions  ADLs/Self Care Home Management;Electrical Stimulation;Moist Heat;DME Instruction;Gait training;Stair training;Functional mobility training;Therapeutic activities;Therapeutic exercise;Balance training;Neuromuscular  re-education;Patient/family education;Manual techniques;Scar mobilization;Compression bandaging;Passive range of motion;Dry needling;Taping;Vasopneumatic Device;Joint Manipulations    PT  Next Visit Plan  ROM and quad activation. Review the TKE ex given for HEP.    PT Home Exercise Plan  [facility]    Consulted and Agree with Plan of Care  Patient       Patient will benefit from skilled therapeutic intervention in order to improve the following deficits and impairments:  Abnormal gait, Decreased activity tolerance, Decreased balance, Decreased endurance, Decreased mobility, Decreased range of motion, Decreased skin integrity, Decreased scar mobility, Decreased strength, Increased edema, Impaired flexibility, Postural dysfunction, Pain  Visit Diagnosis: Unsteadiness on feet  Other abnormalities of gait and mobility  Abnormal posture  Muscle weakness  Stiffness of left knee, not elsewhere classified  Localized edema  Chronic pain of left knee     Problem List Patient Active Problem List   Diagnosis Date Noted  . Status post total left knee replacement 04/29/2019  . Impingement syndrome of right shoulder 10/28/2018  . Chronic pain of left knee 03/27/2018  . Chronic right shoulder pain 03/27/2018  . Cervicalgia 03/27/2018  . Exposure of implanted vaginal mesh ([place]) 03/06/2018  . Outlet dysfunction constipation 03/06/2018  . Status post arthroscopy of left knee 08/06/2017  . Unilateral primary osteoarthritis, left knee 08/06/2017  . Tremor 07/10/2017  . Other meniscus derangements, posterior horn of medial meniscus, left knee 06/28/2017  . Ureteral stone 05/23/2017  . History of sepsis 05/23/2017  . Acute pain of left knee 04/09/2017  . Trochanteric bursitis, right hip 09/27/2016  . GAD (generalized anxiety disorder) 05/31/2014  . Rectocele 08/21/2011  . Uterovaginal prolapse 08/21/2011  . Dyspnea 10/13/2010  . Hypertension 10/13/2010    Ephriam Turman, PTA 07/02/2019,  2:56 PM  [facility] [address], [place], [place], [address] Phone: [phone]   Fax:  [phone]  Name: Sharon [name] MRN: [id number] Date of Birth: 04/06/1966

## 2019-07-03 ENCOUNTER — Ambulatory Visit: Payer: PRIVATE HEALTH INSURANCE | Admitting: Physical Therapy

## 2019-07-03 ENCOUNTER — Other Ambulatory Visit: Payer: Self-pay

## 2019-07-03 DIAGNOSIS — G8929 Other chronic pain: Secondary | ICD-10-CM

## 2019-07-03 DIAGNOSIS — R2681 Unsteadiness on feet: Secondary | ICD-10-CM

## 2019-07-03 DIAGNOSIS — R600 Localized edema: Secondary | ICD-10-CM

## 2019-07-03 DIAGNOSIS — M6281 Muscle weakness (generalized): Secondary | ICD-10-CM

## 2019-07-03 DIAGNOSIS — R293 Abnormal posture: Secondary | ICD-10-CM

## 2019-07-03 DIAGNOSIS — M25562 Pain in left knee: Secondary | ICD-10-CM

## 2019-07-03 DIAGNOSIS — R2689 Other abnormalities of gait and mobility: Secondary | ICD-10-CM

## 2019-07-03 DIAGNOSIS — M25662 Stiffness of left knee, not elsewhere classified: Secondary | ICD-10-CM

## 2019-07-03 NOTE — Therapy (Signed)
[facility] [address] [place], [place], [address] Phone: [phone]   Fax:  [phone]  Physical Therapy Treatment  Patient Details  Name: Sharon [name] MRN: [id number] Date of Birth: 02-Jan-1966 Referring Provider (PT): Jean Rosenthal, MD   Encounter Date: 07/03/2019  PT End of Session - 07/03/19 1451    Visit Number  13    Number of Visits  19    Date for PT Re-Evaluation  07/11/19    Authorization Type  Med[employer] - 30 visits beginning 2021 - track visits below    Authorization - Visit Number  6    Authorization - Number of Visits  30    PT Start Time  6812    PT Stop Time  1459    PT Time Calculation (min)  57 min    Activity Tolerance  Patient tolerated treatment well    Behavior During Therapy  [facility] for tasks assessed/performed       Past Medical History:  Diagnosis Date  . Allergy   . Anal fissure   . Anemia    borderline  . Anxiety   . Arthritis   . Blood clot in vein    left leg  . Detached retina    BOTH SCLERA BUCKLE BOTH EYES  . DVT (deep venous thrombosis) (HCC)    left leg  . Endometriosis   . Family history of adverse reaction to anesthesia    DAUGHTER POST OP PONV  . GERD (gastroesophageal reflux disease)   . Glaucoma    RIGHT WORST THAN LEFT  . Heart murmur    "caused by anxiety"   . History of hemorrhoids   . History of kidney stones   . Hypertension   . IBS (irritable bowel syndrome)   . Perforated eardrum, right    SMALL HOLE  . PONV (postoperative nausea and vomiting)   . Pulmonary embolism ([place]) 6 YRS AGO    Past Surgical History:  Procedure Laterality Date  . ABDOMINAL HYSTERECTOMY    . CATARACT EXTRACTION Bilateral 2015  . COLONOSCOPY    . CYSTOSCOPY W/ URETERAL STENT PLACEMENT Right 05/23/2017   Procedure: CYSTOSCOPY WITH RETROGRADE PYELOGRAM/URETERAL RIGHT STENT PLACEMENT;  Surgeon: Bjorn Loser, MD;  [facility];  Service: Urology;   Laterality: Right;  . CYSTOSCOPY W/ URETERAL STENT PLACEMENT Right 06/04/2017   Procedure: CYSTOSCOPY WITHRIGHT RETROGRADE PYELOGRAMRIGHT /URETEREOSCOPY  STENT PLACEMENT;  Surgeon: Lucas Mallow, MD;  Location: [facility];  Service: Urology;  Laterality: Right;  . ENDOMETRIAL ABLATION    . EYE SURGERY Bilateral    cataract surgery with lens implants  . [place]  2011  . INCONTINENCE SURGERY    . INNER EAR SURGERY     X 2  . KNEE SURGERY    . RECTAL PROLAPSE REPAIR    . RETINAL DETACHMENT SURGERY Bilateral 2000  . RETINAL DETACHMENT SURGERY Bilateral   . TOTAL KNEE ARTHROPLASTY Left 04/29/2019  . TOTAL KNEE ARTHROPLASTY Left 04/29/2019   Procedure: LEFT TOTAL KNEE ARTHROPLASTY;  Surgeon: Mcarthur Rossetti, MD;  Location: [place];  Service: Orthopedics;  Laterality: Left;  . TUBAL LIGATION    . VEIN SURGERY Left 04/2010    There were no vitals filed for this visit.  Subjective Assessment - 07/03/19 1403    Subjective  She worked it really hard yesterday.  I am usually in no pain when I come in but today it hurts    Pertinent  History  right shoulder impingement, cervicalgia, right Trochanteric bursitis, dyspnea, HTN,    Patient Stated Goals  To walk without cane or pain.    Currently in Pain?  Yes    Pain Score  6     Pain Location  Knee    Pain Orientation  Left    Pain Descriptors / Indicators  Sore    Pain Type  Surgical pain    Pain Onset  More than a month ago    Aggravating Factors   exercises and bending    Pain Relieving Factors  med and ice    Multiple Pain Sites  No         [facility] PT Assessment - 07/03/19 0001      PROM   Left Knee Flexion  108   less force needed to range today                  [facility] Adult PT Treatment/Exercise - 07/03/19 0001      Ambulation/Gait   Pre-Gait Activities  ambulating with cues for emphasized heel strike - appx 100 ft    Gait Comments  walking after aerobic equipment to assist in  flushing out inflammation      Knee/Hip Exercises: Aerobic   Stationary Bike  ROM slow x 8 min     Nustep  Start first 5 min, L2 , then  stationary bike x 8 min L1       Knee/Hip Exercises: Seated   Long Arc Quad  Strengthening;Left;10 reps   5 sec hold PT giving OP and cues to engage quads   [employer] Limitations  did 5x with hip ER for increased VMO      Teacher, music Action  IFC    Electrical Stimulation Parameters  80-150 Hz x 10 min    Electrical Stimulation Goals  Pain      Vasopneumatic   Number Minutes Vasopneumatic   10 minutes    Vasopnuematic Location   Knee    Vasopneumatic Pressure  Low    Vasopneumatic Temperature   3 flakes      Manual Therapy   Passive ROM  Knee flexion with end range stretch in both sitting and in prone   contract relax 4x                 PT Long Term Goals - 06/26/19 1538      PT LONG TERM GOAL #1   Title  PROM Left knee flexion 120 degrees    Baseline  intial goal of 90 deg met - 100 post treatment 06/17/19    Status  On-going      PT LONG TERM GOAL #2   Title  PROM Left knee extension -10    Status  On-going      PT LONG TERM GOAL #3   Title  Single leg stance LLE >/= 5 sec    Status  On-going            Plan - 07/03/19 1453    Clinical Impression Statement  Pt arrives today with 6/10 pain and complaints of feeling very sore from yesterday's treatment.  At the end of the treatment pt was pleased with her progress despite the increased pain . She was able to get 108 deg of flexion with PT overpressure in supine position.  Pt got 103 deg without PT assistance which is better than  she has done in the past.  Pt will continue to benefit from skilled PT and she was encouraged to walk around the house regularly emphasizing the heel strike especially when she is feeling sore because it will help to flush ou tthe inflammation.    PT  Treatment/Interventions  ADLs/Self Care Home Management;Electrical Stimulation;Moist Heat;DME Instruction;Gait training;Stair training;Functional mobility training;Therapeutic activities;Therapeutic exercise;Balance training;Neuromuscular re-education;Patient/family education;Manual techniques;Scar mobilization;Compression bandaging;Passive range of motion;Dry needling;Taping;Vasopneumatic Device;Joint Manipulations    PT Next Visit Plan  ROM and quad activation, f/u on walking with heel strike and walking in short bursts to reduce pain    PT Home Exercise Plan  [facility]    Consulted and Agree with Plan of Care  Patient       Patient will benefit from skilled therapeutic intervention in order to improve the following deficits and impairments:  Abnormal gait, Decreased activity tolerance, Decreased balance, Decreased endurance, Decreased mobility, Decreased range of motion, Decreased skin integrity, Decreased scar mobility, Decreased strength, Increased edema, Impaired flexibility, Postural dysfunction, Pain  Visit Diagnosis: No diagnosis found.     Problem List Patient Active Problem List   Diagnosis Date Noted  . Status post total left knee replacement 04/29/2019  . Impingement syndrome of right shoulder 10/28/2018  . Chronic pain of left knee 03/27/2018  . Chronic right shoulder pain 03/27/2018  . Cervicalgia 03/27/2018  . Exposure of implanted vaginal mesh ([place]) 03/06/2018  . Outlet dysfunction constipation 03/06/2018  . Status post arthroscopy of left knee 08/06/2017  . Unilateral primary osteoarthritis, left knee 08/06/2017  . Tremor 07/10/2017  . Other meniscus derangements, posterior horn of medial meniscus, left knee 06/28/2017  . Ureteral stone 05/23/2017  . History of sepsis 05/23/2017  . Acute pain of left knee 04/09/2017  . Trochanteric bursitis, right hip 09/27/2016  . GAD (generalized anxiety disorder) 05/31/2014  . Rectocele 08/21/2011  . Uterovaginal prolapse  08/21/2011  . Dyspnea 10/13/2010  . Hypertension 10/13/2010    Camillo Flaming Johnnisha Forton,PT 07/03/2019, 3:00 PM  [facility] [address], [place], [place], [address] Phone: [phone]   Fax:  [phone]  Name: Adair Lemar MRN: [id number] Date of Birth: Jul 19, 1965

## 2019-07-04 ENCOUNTER — Encounter: Payer: No Typology Code available for payment source | Admitting: Physical Therapy

## 2019-07-04 ENCOUNTER — Telehealth: Payer: Self-pay | Admitting: Orthopaedic Surgery

## 2019-07-04 MED ORDER — METHOCARBAMOL 500 MG PO TABS: 500.0000 mg | ORAL_TABLET | Freq: Four times a day (QID) | ORAL | 1 refills | Status: DC | PRN

## 2019-07-04 MED ORDER — OXYCODONE HCL 5 MG PO TABS: 5.0000 mg | ORAL_TABLET | Freq: Four times a day (QID) | ORAL | 0 refills | Status: DC | PRN

## 2019-07-04 NOTE — Telephone Encounter (Signed)
Please advise 

## 2019-07-04 NOTE — Telephone Encounter (Signed)
Patient called needing Rx refilled (Oxycodone)  and  (Methocarbamol)The number to contact patient is [phone]

## 2019-07-07 ENCOUNTER — Encounter: Payer: Self-pay | Admitting: Physical Therapy

## 2019-07-07 ENCOUNTER — Ambulatory Visit: Payer: PRIVATE HEALTH INSURANCE | Admitting: Physical Therapy

## 2019-07-07 ENCOUNTER — Encounter: Payer: No Typology Code available for payment source | Admitting: Physical Therapy

## 2019-07-07 ENCOUNTER — Other Ambulatory Visit: Payer: Self-pay

## 2019-07-07 DIAGNOSIS — R293 Abnormal posture: Secondary | ICD-10-CM

## 2019-07-07 DIAGNOSIS — M25662 Stiffness of left knee, not elsewhere classified: Secondary | ICD-10-CM

## 2019-07-07 DIAGNOSIS — R2681 Unsteadiness on feet: Secondary | ICD-10-CM | POA: Diagnosis not present

## 2019-07-07 DIAGNOSIS — M6281 Muscle weakness (generalized): Secondary | ICD-10-CM

## 2019-07-07 DIAGNOSIS — M25562 Pain in left knee: Secondary | ICD-10-CM

## 2019-07-07 DIAGNOSIS — G8929 Other chronic pain: Secondary | ICD-10-CM

## 2019-07-07 DIAGNOSIS — R600 Localized edema: Secondary | ICD-10-CM

## 2019-07-07 DIAGNOSIS — R2689 Other abnormalities of gait and mobility: Secondary | ICD-10-CM

## 2019-07-07 NOTE — Therapy (Signed)
[facility] [address] [place], [place], [address] Phone: [phone]   Fax:  [phone]  Physical Therapy Treatment  Patient Details  Name: Sharon [name] MRN: [id number] Date of Birth: Aug 07, 1965 Referring Provider (PT): Jean Rosenthal, MD   Encounter Date: 07/07/2019  PT End of Session - 07/07/19 1409    Visit Number  14    Number of Visits  19    Date for PT Re-Evaluation  07/11/19    Authorization Type  [employer] - 30 visits beginning 2021 - track visits below    Authorization Time Period  MET deductible,  100% covered for 2020    Authorization - Visit Number  7    Authorization - Number of Visits  30    PT Start Time  8366    PT Stop Time  1510    PT Time Calculation (min)  66 min    Activity Tolerance  Patient tolerated treatment well    Behavior During Therapy  [facility] for tasks assessed/performed       Past Medical History:  Diagnosis Date  . Allergy   . Anal fissure   . Anemia    borderline  . Anxiety   . Arthritis   . Blood clot in vein    left leg  . Detached retina    BOTH SCLERA BUCKLE BOTH EYES  . DVT (deep venous thrombosis) (HCC)    left leg  . Endometriosis   . Family history of adverse reaction to anesthesia    DAUGHTER POST OP PONV  . GERD (gastroesophageal reflux disease)   . Glaucoma    RIGHT WORST THAN LEFT  . Heart murmur    "caused by anxiety"   . History of hemorrhoids   . History of kidney stones   . Hypertension   . IBS (irritable bowel syndrome)   . Perforated eardrum, right    SMALL HOLE  . PONV (postoperative nausea and vomiting)   . Pulmonary embolism ([place]) 6 YRS AGO    Past Surgical History:  Procedure Laterality Date  . ABDOMINAL HYSTERECTOMY    . CATARACT EXTRACTION Bilateral 2015  . COLONOSCOPY    . CYSTOSCOPY W/ URETERAL STENT PLACEMENT Right 05/23/2017   Procedure: CYSTOSCOPY WITH RETROGRADE PYELOGRAM/URETERAL RIGHT STENT PLACEMENT;  Surgeon:  Bjorn Loser, MD;  Location: [facility];  Service: Urology;  Laterality: Right;  . CYSTOSCOPY W/ URETERAL STENT PLACEMENT Right 06/04/2017   Procedure: CYSTOSCOPY WITHRIGHT RETROGRADE PYELOGRAMRIGHT /URETEREOSCOPY  STENT PLACEMENT;  Surgeon: Lucas Mallow, MD;  Location: [facility];  Service: Urology;  Laterality: Right;  . ENDOMETRIAL ABLATION    . EYE SURGERY Bilateral    cataract surgery with lens implants  . [place]  2011  . INCONTINENCE SURGERY    . INNER EAR SURGERY     X 2  . KNEE SURGERY    . RECTAL PROLAPSE REPAIR    . RETINAL DETACHMENT SURGERY Bilateral 2000  . RETINAL DETACHMENT SURGERY Bilateral   . TOTAL KNEE ARTHROPLASTY Left 04/29/2019  . TOTAL KNEE ARTHROPLASTY Left 04/29/2019   Procedure: LEFT TOTAL KNEE ARTHROPLASTY;  Surgeon: Mcarthur Rossetti, MD;  Location: [facility];  Service: Orthopedics;  Laterality: Left;  . TUBAL LIGATION    . VEIN SURGERY Left 04/2010    There were no vitals filed for this visit.  Subjective Assessment - 07/07/19 1411    Subjective  I think I am still working hard, I am not having  as much pain later in the day like I was having.    Pertinent History  right shoulder impingement, cervicalgia, right Trochanteric bursitis, dyspnea, HTN,    Currently in Pain?  Yes    Pain Score  4     Pain Location  Knee    Pain Orientation  Left    Pain Descriptors / Indicators  Dull;Aching    Multiple Pain Sites  No                       [facility] Adult PT Treatment/Exercise - 07/07/19 0001      Knee/Hip Exercises: Aerobic   Stationary Bike  Slow full forward ROM x 5 min, inch seat up and another 3 min      Knee/Hip Exercises: Standing   Hip Extension  AROM;Stengthening;Left;1 set;10 reps;Knee straight    Functional Squat  --   To red ball 2x10, PTA holding ball and guiding pelvis     Electrical Stimulation   Electrical Stimulation Location  knee    Electrical Stimulation Action  IFC    Electrical  Stimulation Parameters  80-150 HZ    Electrical Stimulation Goals  Pain      Vasopneumatic   Number Minutes Vasopneumatic   10 minutes    Vasopnuematic Location   Knee    Vasopneumatic Pressure  Medium    Vasopneumatic Temperature   3 flakes      Manual Therapy   Passive ROM  Knee flexion with end range stretch in both sitting and in prone   contract relax 4x                 PT Long Term Goals - 07/03/19 1502      PT LONG TERM GOAL #1   Title  PROM Left knee flexion 120 degrees    Baseline  108 (07/03/19)    Status  On-going      PT LONG TERM GOAL #2   Title  PROM Left knee extension -10    Baseline  appears to be about -10 with PROM, but did not measure today    Status  On-going      PT LONG TERM GOAL #3   Title  Single leg stance LLE >/= 5 sec    Baseline  very weak quad strength    Status  On-going            Plan - 07/07/19 1409    Clinical Impression Statement  Pt arrives with slightly less pain amd reports less knee pain later in the day. Post therapy PROM knee flexion was 108 degrees with PTA applying a downward force to the proximal femur. PT Agave pt some hip flexion and extension exercises for home to work on the translation of her femoral head. This may improve her knee flexion some.    Personal Factors and Comorbidities  Comorbidity 3+;Fitness;Past/Current Experience    Comorbidities  right shoulder impingement, cervicalgia, right Trochanteric bursitis, dyspnea, HTN    Examination-Activity Limitations  Bend;Lift;Locomotion Level;Stand;Stairs;Transfers    Examination-Participation Restrictions  Community Activity    Stability/Clinical Decision Making  Stable/Uncomplicated    Rehab Potential  Good    PT Frequency  3x / week    PT Duration  6 weeks    PT Treatment/Interventions  ADLs/Self Care Home Management;Electrical Stimulation;Moist Heat;DME Instruction;Gait training;Stair training;Functional mobility training;Therapeutic activities;Therapeutic  exercise;Balance training;Neuromuscular re-education;Patient/family education;Manual techniques;Scar mobilization;Compression bandaging;Passive range of motion;Dry needling;Taping;Vasopneumatic Device;Joint Manipulations    PT Next Visit Plan  ROM and quad activation, f/u on walking with heel strike and walking in short bursts to reduce pain    PT Home Exercise Plan  [facility]    Consulted and Agree with Plan of Care  Patient       Patient will benefit from skilled therapeutic intervention in order to improve the following deficits and impairments:  Abnormal gait, Decreased activity tolerance, Decreased balance, Decreased endurance, Decreased mobility, Decreased range of motion, Decreased skin integrity, Decreased scar mobility, Decreased strength, Increased edema, Impaired flexibility, Postural dysfunction, Pain  Visit Diagnosis: Unsteadiness on feet  Other abnormalities of gait and mobility  Abnormal posture  Localized edema  Chronic pain of left knee  Muscle weakness  Stiffness of left knee, not elsewhere classified     Problem List Patient Active Problem List   Diagnosis Date Noted  . Status post total left knee replacement 04/29/2019  . Impingement syndrome of right shoulder 10/28/2018  . Chronic pain of left knee 03/27/2018  . Chronic right shoulder pain 03/27/2018  . Cervicalgia 03/27/2018  . Exposure of implanted vaginal mesh ([place]) 03/06/2018  . Outlet dysfunction constipation 03/06/2018  . Status post arthroscopy of left knee 08/06/2017  . Unilateral primary osteoarthritis, left knee 08/06/2017  . Tremor 07/10/2017  . Other meniscus derangements, posterior horn of medial meniscus, left knee 06/28/2017  . Ureteral stone 05/23/2017  . History of sepsis 05/23/2017  . Acute pain of left knee 04/09/2017  . Trochanteric bursitis, right hip 09/27/2016  . GAD (generalized anxiety disorder) 05/31/2014  . Rectocele 08/21/2011  . Uterovaginal prolapse 08/21/2011  .  Dyspnea 10/13/2010  . Hypertension 10/13/2010    Sung Parodi, PTA 07/07/2019, 3:36 PM  [facility] [address], [place], [place], [address] Phone: [phone]   Fax:  [phone]  Name: Sharon [name] MRN: [id number] Date of Birth: 15-Nov-1965

## 2019-07-09 ENCOUNTER — Other Ambulatory Visit: Payer: Self-pay

## 2019-07-09 ENCOUNTER — Encounter: Payer: No Typology Code available for payment source | Admitting: Physical Therapy

## 2019-07-09 ENCOUNTER — Other Ambulatory Visit: Payer: Self-pay | Admitting: Family Medicine

## 2019-07-09 ENCOUNTER — Encounter: Payer: Self-pay | Admitting: Physical Therapy

## 2019-07-09 ENCOUNTER — Ambulatory Visit: Payer: PRIVATE HEALTH INSURANCE | Admitting: Physical Therapy

## 2019-07-09 DIAGNOSIS — R2689 Other abnormalities of gait and mobility: Secondary | ICD-10-CM

## 2019-07-09 DIAGNOSIS — I10 Essential (primary) hypertension: Secondary | ICD-10-CM

## 2019-07-09 DIAGNOSIS — R2681 Unsteadiness on feet: Secondary | ICD-10-CM | POA: Diagnosis not present

## 2019-07-09 DIAGNOSIS — G8929 Other chronic pain: Secondary | ICD-10-CM

## 2019-07-09 DIAGNOSIS — M6281 Muscle weakness (generalized): Secondary | ICD-10-CM

## 2019-07-09 DIAGNOSIS — M25662 Stiffness of left knee, not elsewhere classified: Secondary | ICD-10-CM

## 2019-07-09 DIAGNOSIS — R293 Abnormal posture: Secondary | ICD-10-CM

## 2019-07-09 DIAGNOSIS — R600 Localized edema: Secondary | ICD-10-CM

## 2019-07-09 NOTE — Therapy (Signed)
[facility] [address] [place], [place], [address] Phone: [phone]   Fax:  [phone]  Physical Therapy Treatment  Patient Details  Name: Sharon [name] MRN: [id number] Date of Birth: 10-28-1965 Referring Provider (PT): Jean Rosenthal, MD   Encounter Date: 07/09/2019  PT End of Session - 07/09/19 1400    Visit Number  15    Number of Visits  19    Date for PT Re-Evaluation  07/11/19    Authorization Type  Med[employer] - 30 visits beginning 2021 - track visits below    Authorization Time Period  MET deductible,  100% covered for 2020    PT Start Time  1400    PT Stop Time  1500    PT Time Calculation (min)  60 min    Activity Tolerance  Patient tolerated treatment well    Behavior During Therapy  [facility] for tasks assessed/performed       Past Medical History:  Diagnosis Date  . Allergy   . Anal fissure   . Anemia    borderline  . Anxiety   . Arthritis   . Blood clot in vein    left leg  . Detached retina    BOTH SCLERA BUCKLE BOTH EYES  . DVT (deep venous thrombosis) (HCC)    left leg  . Endometriosis   . Family history of adverse reaction to anesthesia    DAUGHTER POST OP PONV  . GERD (gastroesophageal reflux disease)   . Glaucoma    RIGHT WORST THAN LEFT  . Heart murmur    "caused by anxiety"   . History of hemorrhoids   . History of kidney stones   . Hypertension   . IBS (irritable bowel syndrome)   . Perforated eardrum, right    SMALL HOLE  . PONV (postoperative nausea and vomiting)   . Pulmonary embolism ([place]) 6 YRS AGO    Past Surgical History:  Procedure Laterality Date  . ABDOMINAL HYSTERECTOMY    . CATARACT EXTRACTION Bilateral 2015  . COLONOSCOPY    . CYSTOSCOPY W/ URETERAL STENT PLACEMENT Right 05/23/2017   Procedure: CYSTOSCOPY WITH RETROGRADE PYELOGRAM/URETERAL RIGHT STENT PLACEMENT;  Surgeon: Bjorn Loser, MD;  [facility];  Service: Urology;  Laterality:  Right;  . CYSTOSCOPY W/ URETERAL STENT PLACEMENT Right 06/04/2017   Procedure: CYSTOSCOPY WITHRIGHT RETROGRADE PYELOGRAMRIGHT /URETEREOSCOPY  STENT PLACEMENT;  Surgeon: Lucas Mallow, MD;  Location: [facility];  Service: Urology;  Laterality: Right;  . ENDOMETRIAL ABLATION    . EYE SURGERY Bilateral    cataract surgery with lens implants  . [place]  2011  . INCONTINENCE SURGERY    . INNER EAR SURGERY     X 2  . KNEE SURGERY    . RECTAL PROLAPSE REPAIR    . RETINAL DETACHMENT SURGERY Bilateral 2000  . RETINAL DETACHMENT SURGERY Bilateral   . TOTAL KNEE ARTHROPLASTY Left 04/29/2019  . TOTAL KNEE ARTHROPLASTY Left 04/29/2019   Procedure: LEFT TOTAL KNEE ARTHROPLASTY;  Surgeon: Mcarthur Rossetti, MD;  Location: [facility];  Service: Orthopedics;  Laterality: Left;  . TUBAL LIGATION    . VEIN SURGERY Left 04/2010    There were no vitals filed for this visit.  Subjective Assessment - 07/09/19 1402    Subjective  I had 3 good days then my knee seemed to stiffen back up. I felt like I was walking so good but tnow i am walking "bad " again.  Pertinent History  right shoulder impingement, cervicalgia, right Trochanteric bursitis, dyspnea, HTN,    Currently in Pain?  Yes    Pain Score  7     Pain Location  Knee    Pain Orientation  Left   Knee to hip is most sore   Pain Descriptors / Indicators  Sore    Aggravating Factors   probably stretching    Pain Relieving Factors  meds ice    Multiple Pain Sites  No         [facility] PT Assessment - 07/09/19 0001      PROM   Left Knee Flexion  110                   [facility] Adult PT Treatment/Exercise - 07/09/19 0001      Knee/Hip Exercises: Aerobic   Nustep  L1 x 5 min at seat 11, then seat 10 5 min      Electrical Stimulation   Electrical Stimulation Location  knee    Electrical Stimulation Action  IFC    Electrical Stimulation Parameters  80-150 HZ    Electrical Stimulation Goals  Pain       Vasopneumatic   Number Minutes Vasopneumatic   10 minutes    Vasopnuematic Location   Knee    Vasopneumatic Pressure  Medium      Manual Therapy   Passive ROM  Knee flexion with end range stretch in both sitting and in prone   contract relax 4x                 PT Long Term Goals - 07/03/19 1502      PT LONG TERM GOAL #1   Title  PROM Left knee flexion 120 degrees    Baseline  108 (07/03/19)    Status  On-going      PT LONG TERM GOAL #2   Title  PROM Left knee extension -10    Baseline  appears to be about -10 with PROM, but did not measure today    Status  On-going      PT LONG TERM GOAL #3   Title  Single leg stance LLE >/= 5 sec    Baseline  very weak quad strength    Status  On-going            Plan - 07/09/19 1401    Clinical Impression Statement  Pt arrives stiff and sore. She says she feels sore "all the way from my knee caps all the way up to my hips." Pt measures 110 PROM in supine at the end of session. Pt needs VC to keep hip/pelvis down.    Personal Factors and Comorbidities  Comorbidity 3+;Fitness;Past/Current Experience    Comorbidities  right shoulder impingement, cervicalgia, right Trochanteric bursitis, dyspnea, HTN    Examination-Activity Limitations  Bend;Lift;Locomotion Level;Stand;Stairs;Transfers    Examination-Participation Restrictions  Community Activity    Stability/Clinical Decision Making  Stable/Uncomplicated    Rehab Potential  Good    PT Frequency  3x / week    PT Duration  6 weeks    PT Treatment/Interventions  ADLs/Self Care Home Management;Electrical Stimulation;Moist Heat;DME Instruction;Gait training;Stair training;Functional mobility training;Therapeutic activities;Therapeutic exercise;Balance training;Neuromuscular re-education;Patient/family education;Manual techniques;Scar mobilization;Compression bandaging;Passive range of motion;Dry needling;Taping;Vasopneumatic Device;Joint Manipulations    PT Next Visit Plan  To MD  after PT session tomorrow    PT Home Exercise Plan  [facility]       Patient will benefit from skilled therapeutic intervention in order  to improve the following deficits and impairments:  Abnormal gait, Decreased activity tolerance, Decreased balance, Decreased endurance, Decreased mobility, Decreased range of motion, Decreased skin integrity, Decreased scar mobility, Decreased strength, Increased edema, Impaired flexibility, Postural dysfunction, Pain  Visit Diagnosis: Unsteadiness on feet  Other abnormalities of gait and mobility  Abnormal posture  Localized edema  Chronic pain of left knee  Muscle weakness  Stiffness of left knee, not elsewhere classified     Problem List Patient Active Problem List   Diagnosis Date Noted  . Status post total left knee replacement 04/29/2019  . Impingement syndrome of right shoulder 10/28/2018  . Chronic pain of left knee 03/27/2018  . Chronic right shoulder pain 03/27/2018  . Cervicalgia 03/27/2018  . Exposure of implanted vaginal mesh ([place]) 03/06/2018  . Outlet dysfunction constipation 03/06/2018  . Status post arthroscopy of left knee 08/06/2017  . Unilateral primary osteoarthritis, left knee 08/06/2017  . Tremor 07/10/2017  . Other meniscus derangements, posterior horn of medial meniscus, left knee 06/28/2017  . Ureteral stone 05/23/2017  . History of sepsis 05/23/2017  . Acute pain of left knee 04/09/2017  . Trochanteric bursitis, right hip 09/27/2016  . GAD (generalized anxiety disorder) 05/31/2014  . Rectocele 08/21/2011  . Uterovaginal prolapse 08/21/2011  . Dyspnea 10/13/2010  . Hypertension 10/13/2010    Ahman Dugdale, PTA 07/09/2019, 3:25 PM  [facility] [address] [place], [place], [address] Phone: [phone]   Fax:  [phone]  Name: Sharon [name] MRN: [id number] Date of Birth: 01/02/1966

## 2019-07-10 ENCOUNTER — Ambulatory Visit (INDEPENDENT_AMBULATORY_CARE_PROVIDER_SITE_OTHER): Payer: No Typology Code available for payment source | Admitting: Orthopaedic Surgery

## 2019-07-10 ENCOUNTER — Encounter: Payer: Self-pay | Admitting: Orthopaedic Surgery

## 2019-07-10 ENCOUNTER — Encounter: Payer: Self-pay | Admitting: Physical Therapy

## 2019-07-10 ENCOUNTER — Ambulatory Visit: Payer: PRIVATE HEALTH INSURANCE | Admitting: Physical Therapy

## 2019-07-10 DIAGNOSIS — R2689 Other abnormalities of gait and mobility: Secondary | ICD-10-CM

## 2019-07-10 DIAGNOSIS — Z96652 Presence of left artificial knee joint: Secondary | ICD-10-CM

## 2019-07-10 DIAGNOSIS — M6281 Muscle weakness (generalized): Secondary | ICD-10-CM

## 2019-07-10 DIAGNOSIS — M25662 Stiffness of left knee, not elsewhere classified: Secondary | ICD-10-CM

## 2019-07-10 DIAGNOSIS — R293 Abnormal posture: Secondary | ICD-10-CM

## 2019-07-10 DIAGNOSIS — R2681 Unsteadiness on feet: Secondary | ICD-10-CM | POA: Diagnosis not present

## 2019-07-10 DIAGNOSIS — G8929 Other chronic pain: Secondary | ICD-10-CM

## 2019-07-10 DIAGNOSIS — R600 Localized edema: Secondary | ICD-10-CM

## 2019-07-10 MED ORDER — NABUMETONE 750 MG PO TABS: 750.0000 mg | ORAL_TABLET | Freq: Two times a day (BID) | ORAL | 2 refills | Status: DC | PRN

## 2019-07-10 NOTE — Therapy (Addendum)
[facility] [address], [place], [place], [address] Phone: [phone]   Fax:  [phone]  Physical Therapy Treatment  Patient Details  Name: Sharon [name] MRN: [id number] Date of Birth: 09/08/1965 Referring Provider (PT): Jean Rosenthal, MD   Encounter Date: 07/10/2019  PT End of Session - 07/10/19 1404    Visit Number  16    Number of Visits  19    Date for PT Re-Evaluation 09/04/2019   Authorization Type  Medcost - 30 visits beginning 2021 - track visits below    Authorization Time Period  MET deductible,  100% covered for 2020    Authorization - Visit Number  8    Authorization - Number of Visits  30    PT Start Time  5726    PT Stop Time  1500    PT Time Calculation (min)  56 min    Activity Tolerance  Patient tolerated treatment well    Behavior During Therapy  [facility] for tasks assessed/performed       Past Medical History:  Diagnosis Date  . Allergy   . Anal fissure   . Anemia    borderline  . Anxiety   . Arthritis   . Blood clot in vein    left leg  . Detached retina    BOTH SCLERA BUCKLE BOTH EYES  . DVT (deep venous thrombosis) (HCC)    left leg  . Endometriosis   . Family history of adverse reaction to anesthesia    DAUGHTER POST OP PONV  . GERD (gastroesophageal reflux disease)   . Glaucoma    RIGHT WORST THAN LEFT  . Heart murmur    "caused by anxiety"   . History of hemorrhoids   . History of kidney stones   . Hypertension   . IBS (irritable bowel syndrome)   . Perforated eardrum, right    SMALL HOLE  . PONV (postoperative nausea and vomiting)   . Pulmonary embolism ([place]) 6 YRS AGO    Past Surgical History:  Procedure Laterality Date  . ABDOMINAL HYSTERECTOMY    . CATARACT EXTRACTION Bilateral 2015  . COLONOSCOPY    . CYSTOSCOPY W/ URETERAL STENT PLACEMENT Right 05/23/2017   Procedure: CYSTOSCOPY WITH RETROGRADE PYELOGRAM/URETERAL RIGHT STENT PLACEMENT;  Surgeon:  Bjorn Loser, MD;  [facility];  Service: Urology;  Laterality: Right;  . CYSTOSCOPY W/ URETERAL STENT PLACEMENT Right 06/04/2017   Procedure: CYSTOSCOPY WITHRIGHT RETROGRADE PYELOGRAMRIGHT /URETEREOSCOPY  STENT PLACEMENT;  Surgeon: Lucas Mallow, MD;  Location: [facility];  Service: Urology;  Laterality: Right;  . ENDOMETRIAL ABLATION    . EYE SURGERY Bilateral    cataract surgery with lens implants  . [place]  2011  . INCONTINENCE SURGERY    . INNER EAR SURGERY     X 2  . KNEE SURGERY    . RECTAL PROLAPSE REPAIR    . RETINAL DETACHMENT SURGERY Bilateral 2000  . RETINAL DETACHMENT SURGERY Bilateral   . TOTAL KNEE ARTHROPLASTY Left 04/29/2019  . TOTAL KNEE ARTHROPLASTY Left 04/29/2019   Procedure: LEFT TOTAL KNEE ARTHROPLASTY;  Surgeon: Mcarthur Rossetti, MD;  Location: [facility];  Service: Orthopedics;  Laterality: Left;  . TUBAL LIGATION    . VEIN SURGERY Left 04/2010    There were no vitals filed for this visit.  Subjective Assessment - 07/10/19 1407    Subjective  It tightens back up.  I feel like the bike creates inflammation maybe  because I do it at home too.    Pertinent History  right shoulder impingement, cervicalgia, right Trochanteric bursitis, dyspnea, HTN,    Currently in Pain?  Yes    Pain Score  5     Pain Location  Knee    Pain Orientation  Left    Pain Descriptors / Indicators  Burning;Sore;Tightness    Pain Type  Surgical pain    Pain Onset  More than a month ago    Pain Frequency  Constant    Multiple Pain Sites  No                       [facility] Adult PT Treatment/Exercise - 07/10/19 0001      Knee/Hip Exercises: Stretches   Knee: Self-Stretch to increase Flexion  Left;5 reps;10 seconds      Knee/Hip Exercises: Aerobic   Nustep  L3 at seat 10 x10 min   PT present for status     Knee/Hip Exercises: Machines for Strengthening   Cybex Leg Press  Seat 7: LTLE eccentrics 40# 2x20   needs to use  RtLE to assisst     Knee/Hip Exercises: Supine   Knee Flexion  AAROM;Left;5 reps   10 sec; some PT OP for 2/5 reps   Knee Flexion Limitations  102 knee flexion max today      Vasopneumatic   Number Minutes Vasopneumatic   10 minutes    Vasopnuematic Location   Knee    Vasopneumatic Pressure  Medium    Vasopneumatic Temperature   3 flakes      Manual Therapy   Soft tissue mobilization  scar massage with horizontal fascial pulling bilat - more stiffness in center of the incision    Passive ROM  Knee flexion with end range stretch in both sitting and in prone   contract relax 4x                 PT Long Term Goals - 07/03/19 1502      PT LONG TERM GOAL #1   Title  PROM Left knee flexion 120 degrees    Baseline  108 (07/03/19)    Status  On-going      PT LONG TERM GOAL #2   Title  PROM Left knee extension -10    Baseline  appears to be about -10 with PROM, but did not measure today    Status  On-going      PT LONG TERM GOAL #3   Title  Single leg stance LLE >/= 5 sec    Baseline  very weak quad strength    Status  On-going            Plan - 07/10/19 1448    Clinical Impression Statement Pt was more stiff and sore today than yesterday.  Pt did not get as much ROM today as she did at yesterday's session but she still demontrates good progress with 102 degrees of flexion with overpressure from PT and fairly easily making it to 100 deg of knee flexion on her own.  Pt has scar tissue adhesions and fascial restrictions at the center of the incision.  Pt will continue to benefit from skilled PT to address ROM and fascial restrictions and progress quad strength for imporved gait. She will benefit from skilled PT to continue to work on ROM and strength.  She is expected to continue to make progress at this time and would most likely decline without skilled  PT at this time.   PT Treatment/Interventions  ADLs/Self Care Home Management;Electrical Stimulation;Moist Heat;DME  Instruction;Gait training;Stair training;Functional mobility training;Therapeutic activities;Therapeutic exercise;Balance training;Neuromuscular re-education;Patient/family education;Manual techniques;Scar mobilization;Compression bandaging;Passive range of motion;Dry needling;Taping;Vasopneumatic Device;Joint Manipulations    PT Next Visit Plan  f/u on MD appointment, ROM and quad strength    PT Home Exercise Plan  [facility]    Consulted and Agree with Plan of Care  Patient       Patient will benefit from skilled therapeutic intervention in order to improve the following deficits and impairments:  Abnormal gait, Decreased activity tolerance, Decreased balance, Decreased endurance, Decreased mobility, Decreased range of motion, Decreased skin integrity, Decreased scar mobility, Decreased strength, Increased edema, Impaired flexibility, Postural dysfunction, Pain  Visit Diagnosis: Unsteadiness on feet  Other abnormalities of gait and mobility  Abnormal posture  Localized edema  Chronic pain of left knee  Muscle weakness  Stiffness of left knee, not elsewhere classified     Problem List Patient Active Problem List   Diagnosis Date Noted  . Status post total left knee replacement 04/29/2019  . Impingement syndrome of right shoulder 10/28/2018  . Chronic pain of left knee 03/27/2018  . Chronic right shoulder pain 03/27/2018  . Cervicalgia 03/27/2018  . Exposure of implanted vaginal mesh ([place]) 03/06/2018  . Outlet dysfunction constipation 03/06/2018  . Status post arthroscopy of left knee 08/06/2017  . Unilateral primary osteoarthritis, left knee 08/06/2017  . Tremor 07/10/2017  . Other meniscus derangements, posterior horn of medial meniscus, left knee 06/28/2017  . Ureteral stone 05/23/2017  . History of sepsis 05/23/2017  . Acute pain of left knee 04/09/2017  . Trochanteric bursitis, right hip 09/27/2016  . GAD (generalized anxiety disorder) 05/31/2014  . Rectocele  08/21/2011  . Uterovaginal prolapse 08/21/2011  . Dyspnea 10/13/2010  . Hypertension 10/13/2010    Jule Ser, PT 07/10/2019, 2:57 PM  [facility] [address], [place], [place], [address] Phone: [phone]   Fax:  [phone]  Name: Sharon [name] MRN: [id number] Date of Birth: 1965-12-29

## 2019-07-10 NOTE — Progress Notes (Signed)
The patient is now 10 weeks status post a left total knee arthroplasty.  Given her young age of 89, this is been very painful course in terms of trying to get her motion back.  We still had her on oxycodone and Relafen.  She is going through aggressive physical therapy on her knee.  She reports that they have been able to flex her now to 110 degrees and hyperextension passively almost full.  In the office today her extension is almost full and I can flex her to just past 90 degrees.  Overall, this does appear to be improved over the last few weeks.  We need to have her to continue aggressive physical therapy on her left knee.  I gave her a note to extend her physical therapy for 4 more weeks to hopefully optimize her outcome.  I still have recommended trying to wean her narcotics but understand that she still needs to be on these.  I felt it was essential to provide an intra-articular steroid injection and her left knee today as well to help with the deep inflammation.  I anticipate her needing to be out of work likely through the month of February due to her pain and continued need for therapy as well as her being on narcotics which is limiting her ability to work and drive.  I would like to see her back in 4 weeks.  No x-rays are needed.  All questions and concerns were answered and addressed.

## 2019-07-11 ENCOUNTER — Encounter: Payer: No Typology Code available for payment source | Admitting: Physical Therapy

## 2019-07-14 ENCOUNTER — Encounter: Payer: No Typology Code available for payment source | Admitting: Physical Therapy

## 2019-07-14 ENCOUNTER — Other Ambulatory Visit: Payer: Self-pay | Admitting: Orthopaedic Surgery

## 2019-07-14 ENCOUNTER — Other Ambulatory Visit: Payer: Self-pay

## 2019-07-14 ENCOUNTER — Telehealth: Payer: Self-pay | Admitting: Orthopaedic Surgery

## 2019-07-14 ENCOUNTER — Ambulatory Visit: Payer: PRIVATE HEALTH INSURANCE | Admitting: Physical Therapy

## 2019-07-14 ENCOUNTER — Encounter: Payer: Self-pay | Admitting: Physical Therapy

## 2019-07-14 DIAGNOSIS — R2689 Other abnormalities of gait and mobility: Secondary | ICD-10-CM

## 2019-07-14 DIAGNOSIS — M25662 Stiffness of left knee, not elsewhere classified: Secondary | ICD-10-CM

## 2019-07-14 DIAGNOSIS — R2681 Unsteadiness on feet: Secondary | ICD-10-CM | POA: Diagnosis not present

## 2019-07-14 DIAGNOSIS — M6281 Muscle weakness (generalized): Secondary | ICD-10-CM

## 2019-07-14 DIAGNOSIS — R293 Abnormal posture: Secondary | ICD-10-CM

## 2019-07-14 DIAGNOSIS — G8929 Other chronic pain: Secondary | ICD-10-CM

## 2019-07-14 DIAGNOSIS — R600 Localized edema: Secondary | ICD-10-CM

## 2019-07-14 MED ORDER — OXYCODONE HCL 5 MG PO TABS: 5.0000 mg | ORAL_TABLET | Freq: Four times a day (QID) | ORAL | 0 refills | Status: DC | PRN

## 2019-07-14 NOTE — Telephone Encounter (Signed)
Patient called.  She is requesting a refill on her OxyCodone.  Call back number: [phone]

## 2019-07-14 NOTE — Therapy (Signed)
[facility] [address] [place], [place], [address] Phone: [phone]   Fax:  [phone]  Physical Therapy Treatment  Patient Details  Name: Sharon [name] MRN: [id number] Date of Birth: [birth year] Referring Provider (PT): Jean Rosenthal, MD   Encounter Date: 07/14/2019  PT End of Session - 07/14/19 1407    Visit Number  17    Number of Visits  19    Date for PT Re-Evaluation  07/11/19    Authorization Type  [employer] - 30 visits beginning 2021 - track visits below    Authorization Time Period  MET deductible,  100% covered for 2020    PT Start Time  1405    PT Stop Time  1505    PT Time Calculation (min)  60 min    Activity Tolerance  Patient tolerated treatment well    Behavior During Therapy  [facility] for tasks assessed/performed       Past Medical History:  Diagnosis Date  . Allergy   . Anal fissure   . Anemia    borderline  . Anxiety   . Arthritis   . Blood clot in vein    left leg  . Detached retina    BOTH SCLERA BUCKLE BOTH EYES  . DVT (deep venous thrombosis) (HCC)    left leg  . Endometriosis   . Family history of adverse reaction to anesthesia    DAUGHTER POST OP PONV  . GERD (gastroesophageal reflux disease)   . Glaucoma    RIGHT WORST THAN LEFT  . Heart murmur    "caused by anxiety"   . History of hemorrhoids   . History of kidney stones   . Hypertension   . IBS (irritable bowel syndrome)   . Perforated eardrum, right    SMALL HOLE  . PONV (postoperative nausea and vomiting)   . Pulmonary embolism ([place]) 6 YRS AGO    Past Surgical History:  Procedure Laterality Date  . ABDOMINAL HYSTERECTOMY    . CATARACT EXTRACTION Bilateral 2015  . COLONOSCOPY    . CYSTOSCOPY W/ URETERAL STENT PLACEMENT Right 05/23/2017   Procedure: CYSTOSCOPY WITH RETROGRADE PYELOGRAM/URETERAL RIGHT STENT PLACEMENT;  Surgeon: Bjorn Loser, MD;  Location: [facility];  Service: Urology;  Laterality:  Right;  . CYSTOSCOPY W/ URETERAL STENT PLACEMENT Right 06/04/2017   Procedure: CYSTOSCOPY WITHRIGHT RETROGRADE PYELOGRAMRIGHT /URETEREOSCOPY  STENT PLACEMENT;  Surgeon: Lucas Mallow, MD;  Location: [facility];  Service: Urology;  Laterality: Right;  . ENDOMETRIAL ABLATION    . EYE SURGERY Bilateral    cataract surgery with lens implants  . [place]  2011  . INCONTINENCE SURGERY    . INNER EAR SURGERY     X 2  . KNEE SURGERY    . RECTAL PROLAPSE REPAIR    . RETINAL DETACHMENT SURGERY Bilateral 2000  . RETINAL DETACHMENT SURGERY Bilateral   . TOTAL KNEE ARTHROPLASTY Left 04/29/2019  . TOTAL KNEE ARTHROPLASTY Left 04/29/2019   Procedure: LEFT TOTAL KNEE ARTHROPLASTY;  Surgeon: Mcarthur Rossetti, MD;  Location: [place];  Service: Orthopedics;  Laterality: Left;  . TUBAL LIGATION    . VEIN SURGERY Left 04/2010    There were no vitals filed for this visit.  Subjective Assessment - 07/14/19 1411    Subjective  MD feels I am progressing. He gave me a shot in my knee.It has been very sore and stiff since.    Pertinent History  right shoulder impingement, cervicalgia,  right Trochanteric bursitis, dyspnea, HTN,    Currently in Pain?  Yes    Pain Score  6     Pain Location  Knee    Pain Orientation  Left    Pain Descriptors / Indicators  Sore;Tightness    Multiple Pain Sites  No         [facility] PT Assessment - 07/14/19 0001      PROM   Left Knee Flexion  --   105 then pushed it to 110                  [facility] Adult PT Treatment/Exercise - 07/14/19 0001      Knee/Hip Exercises: Aerobic   Nustep  L1 seat 9 x 10 min      Knee/Hip Exercises: Standing   Functional Squat  2 sets;10 reps    Functional Squat Limitations  Sit to red ball ( PTA holding)       Knee/Hip Exercises: Prone   Hamstring Curl  1 set;10 reps      Vasopneumatic   Number Minutes Vasopneumatic   10 minutes    Vasopnuematic Location   Knee    Vasopneumatic Pressure   Medium    Vasopneumatic Temperature   3 flakes      Manual Therapy   Soft tissue mobilization  scar massage with horizontal fascial pulling  bil, [place] assisted scar and fascial mobs distal and medial knee    Passive ROM  Knee flexion with end range stretch in both sitting and in prone   contract relax 8x2 sets                 PT Long Term Goals - 07/03/19 1502      PT LONG TERM GOAL #1   Title  PROM Left knee flexion 120 degrees    Baseline  108 (07/03/19)    Status  On-going      PT LONG TERM GOAL #2   Title  PROM Left knee extension -10    Baseline  appears to be about -10 with PROM, but did not measure today    Status  On-going      PT LONG TERM GOAL #3   Title  Single leg stance LLE >/= 5 sec    Baseline  very weak quad strength    Status  On-going            Plan - 07/14/19 1407    Clinical Impression Statement  Pt received an injection in her last Thursday which she feels has made her more stiff and sore. "He said it could take 7-12 days to help" Post session PTA could passively get knee to 105 degrees with mild effort then 110 with max effort. Pt could perform functional squats ( 25% use of UE) without any hip hiking today.    Personal Factors and Comorbidities  Comorbidity 3+;Fitness;Past/Current Experience    Comorbidities  right shoulder impingement, cervicalgia, right Trochanteric bursitis, dyspnea, HTN    Examination-Activity Limitations  Bend;Lift;Locomotion Level;Stand;Stairs;Transfers    Examination-Participation Restrictions  Community Activity    Stability/Clinical Decision Making  Stable/Uncomplicated    Rehab Potential  Good    PT Frequency  3x / week    PT Duration  6 weeks    PT Treatment/Interventions  ADLs/Self Care Home Management;Electrical Stimulation;Moist Heat;DME Instruction;Gait training;Stair training;Functional mobility training;Therapeutic activities;Therapeutic exercise;Balance training;Neuromuscular  re-education;Patient/family education;Manual techniques;Scar mobilization;Compression bandaging;Passive range of motion;Dry needling;Taping;Vasopneumatic Device;Joint Manipulations    PT Next Visit Plan  Manual techniques for scar mobilization and knee flexion stretching, functional LE strength ( quads) . Pt hates the leg press FYI.    PT Home Exercise Plan  [facility]    Consulted and Agree with Plan of Care  Patient       Patient will benefit from skilled therapeutic intervention in order to improve the following deficits and impairments:  Abnormal gait, Decreased activity tolerance, Decreased balance, Decreased endurance, Decreased mobility, Decreased range of motion, Decreased skin integrity, Decreased scar mobility, Decreased strength, Increased edema, Impaired flexibility, Postural dysfunction, Pain  Visit Diagnosis: Unsteadiness on feet  Other abnormalities of gait and mobility  Abnormal posture  Localized edema  Chronic pain of left knee  Muscle weakness  Stiffness of left knee, not elsewhere classified     Problem List Patient Active Problem List   Diagnosis Date Noted  . Status post total left knee replacement 04/29/2019  . Impingement syndrome of right shoulder 10/28/2018  . Chronic pain of left knee 03/27/2018  . Chronic right shoulder pain 03/27/2018  . Cervicalgia 03/27/2018  . Exposure of implanted vaginal mesh ([place]) 03/06/2018  . Outlet dysfunction constipation 03/06/2018  . Status post arthroscopy of left knee 08/06/2017  . Unilateral primary osteoarthritis, left knee 08/06/2017  . Tremor 07/10/2017  . Other meniscus derangements, posterior horn of medial meniscus, left knee 06/28/2017  . Ureteral stone 05/23/2017  . History of sepsis 05/23/2017  . Acute pain of left knee 04/09/2017  . Trochanteric bursitis, right hip 09/27/2016  . GAD (generalized anxiety disorder) 05/31/2014  . Rectocele 08/21/2011  . Uterovaginal prolapse 08/21/2011  . Dyspnea  10/13/2010  . Hypertension 10/13/2010    Sharon [name], PTA 07/14/2019, 3:37 PM  [facility] [address], [place], [place], [address] Phone: [phone]   Fax:  [phone]  Name: Sharon [name] MRN: [id number] Date of Birth: [birth year]

## 2019-07-15 ENCOUNTER — Encounter: Payer: Self-pay | Admitting: Neurology

## 2019-07-15 NOTE — Addendum Note (Signed)
Addended by: Su Hoff on: 07/15/2019 12:42 PM   Modules accepted: Orders

## 2019-07-15 NOTE — Progress Notes (Signed)
Virtual Visit via Video Note The purpose of this virtual visit is to provide medical care while limiting exposure to the novel coronavirus.    Consent was obtained for video visit:  Yes.   Answered questions that patient had about telehealth interaction:  Yes.   I discussed the limitations, risks, security and privacy concerns of performing an evaluation and management service by telemedicine. I also discussed with the patient that there may be a patient responsible charge related to this service. The patient expressed understanding and agreed to proceed.  Pt location: Home Physician Location: office Name of referring provider:  Forrest Moron, MD I connected with Ulice Brilliant at patients initiation/request on 07/16/2019 at  9:50 AM EST by video enabled telemedicine application and verified that I am speaking with the correct person using two identifiers. Pt MRN:  [id number] Pt DOB:  1966/04/02 Video Participants:  Ulice Brilliant   History of Present Illness:  Sharon [name] is a 54 year old female with generalized anxiety disorder, restless leg syndrome, hypertension, glaucoma, arthritis, and history of PE and DVT in left leg who follows up for essential tremor and chronic pain.  UPDATE: Medications:  Primidone 150mg ; ibuprofen; Robaxin 500mg ; Klonopin; Celebrex; oxycodone; Relafen  In September, primidone was increased to 150mg  every morning.  She said it made her feel sick, so she went back to 100mg .    She had left knee replacement in November which has caused discomfort and stiffness.  She underwent lab work on 03/04/2019 to evaluate chronic pain.  ANA was negative, RF negative, sed rate 5, CRP <0.1, CK 49  HISTORY: She started having tremors around 2015.  It involves both hands.  It interferes with daily tasks.  She drops a lot.  She has occasional trouble with utensils or writing.  Nothing particular makes it worse.  Steadying her wrist on the arm rest will suppress  it.    Her mother and grandmother had tremor.  She has chronic generalized pain as well as neck pain.  She reports a creepy crawly feeling under her skin, including back and down the legs.  She has been diagnosed with restless leg syndrome.  MRI of cervical spine from 04/12/18 was personally reviewed and showed cervical spondylosis, degenerative disc disease and congenitally short pedicles, causing mild impingement at C3-4 on left, C6-7 on right and C7-T1 on left.  CT of right shoulder on 04/11/18 showed severe supraspinatus and infraspinatus tendinopathy without tear as well as small type 2 SLAP tear.  She notes a 'pinching" pain radiating down the lateral arm and into the fingers with associated numbness and tingling in the fingers.  MRI lumbar spine from 04/13/18 showed L4-L5 right foraminal disc protrusion that may be causing right L4 nerve root irritation.  L5-S1 moderate face arthrosis and small central protrusion with annular fissure.  For tremor, she was started on primidone 100mg  every morning.   For anxiety and RLS, she takes clonazepam 0.5mg  at bedtime.  She takes Bystolic for blood pressure.   Past Medical History: Past Medical History:  Diagnosis Date   Allergy    Anal fissure    Anemia    borderline   Anxiety    Arthritis    Blood clot in vein    left leg   Detached retina    BOTH SCLERA BUCKLE BOTH EYES   DVT (deep venous thrombosis) (HCC)    left leg   Endometriosis    Family history of adverse reaction to anesthesia  DAUGHTER POST OP PONV   GERD (gastroesophageal reflux disease)    Glaucoma    RIGHT WORST THAN LEFT   Heart murmur    "caused by anxiety"    History of hemorrhoids    History of kidney stones    Hypertension    IBS (irritable bowel syndrome)    Perforated eardrum, right    SMALL HOLE   PONV (postoperative nausea and vomiting)    Pulmonary embolism (HCC) 6 YRS AGO    Medications: Outpatient Encounter Medications as of  07/16/2019  Medication Sig Note   acetaminophen (TYLENOL) 500 MG tablet Take 1,000 mg by mouth every 8 (eight) hours as needed for moderate pain.     acyclovir (ZOVIRAX) 400 MG tablet Take 1 tablet (400 mg total) by mouth at bedtime.    Ascorbic Acid (VITAMIN C) 1000 MG tablet Take 1,000 mg by mouth daily. 04/21/2019: On hold    ASPIRIN LOW DOSE 81 MG chewable tablet CHEW 1 TABLET (81 MG TOTAL) BY MOUTH 2 (TWO) TIMES DAILY.    BYSTOLIC 5 MG tablet TAKE 1 TABLET BY MOUTH EVERY DAY    celecoxib (CELEBREX) 200 MG capsule TAKE 1 CAPSULE BY MOUTH TWICE A DAY    Cholecalciferol (VITAMIN D) 2000 UNITS tablet Take 2,000 Units by mouth daily. 04/21/2019: On hold   clonazePAM (KLONOPIN) 0.5 MG tablet Take 1 tablet (0.5 mg total) by mouth at bedtime.    EPINEPHrine 0.3 mg/0.3 mL IJ SOAJ injection Inject 0.3 mLs (0.3 mg total) into the muscle once as needed (For anaphylaxis.). (Patient taking differently: Inject 0.3 mg into the muscle once as needed for anaphylaxis. )    ibuprofen (ADVIL,MOTRIN) 200 MG tablet Take 400 mg by mouth 2 (two) times daily.     Magnesium 500 MG CAPS Take 500 mg by mouth daily.  04/21/2019: On hold   methocarbamol (ROBAXIN) 500 MG tablet Take 1 tablet (500 mg total) by mouth every 6 (six) hours as needed for muscle spasms.    Multiple Vitamin (MULTI-VITAMINS) TABS Take 1 tablet by mouth daily.     nabumetone (RELAFEN) 750 MG tablet Take 1 tablet (750 mg total) by mouth 2 (two) times daily as needed.    Nitroglycerin (RECTIV) 0.4 % OINT Place 0.4 inches rectally 2 (two) times daily as needed (For anal fissures.).    omeprazole (PRILOSEC) 20 MG capsule Take 1 capsule (20 mg total) by mouth 2 (two) times daily before a meal.    ondansetron (ZOFRAN ODT) 4 MG disintegrating tablet Take 1 tablet (4 mg total) by mouth every 8 (eight) hours as needed for nausea or vomiting.    ondansetron (ZOFRAN) 8 MG tablet Take 1 tablet (8 mg total) by mouth every 8 (eight) hours as needed  for nausea or vomiting.    oxyCODONE (OXY IR/ROXICODONE) 5 MG immediate release tablet Take 1-2 tablets (5-10 mg total) by mouth every 6 (six) hours as needed for moderate pain (pain score 4-6).    Polyethylene Glycol 3350 (MIRALAX PO) Take 17 g by mouth daily as needed (constipation).     primidone (MYSOLINE) 50 MG tablet TAKE 3 TABLETS BY MOUTH EVERY DAY    senna-docusate (SENOKOT-S) 8.6-50 MG tablet Take 1 tablet by mouth daily as needed for moderate constipation.     Travoprost, BAK Free, (TRAVATAN) 0.004 % SOLN ophthalmic solution INSTILL 1 DROP INTO BOTH EYES AT BEDTIME    vitamin B-12 (CYANOCOBALAMIN) 1000 MCG tablet Take 1 tablet (1,000 mcg total) by mouth daily. 04/21/2019: On  hold   zinc gluconate 50 MG tablet Take 50 mg by mouth daily. 04/21/2019: On hold   No facility-administered encounter medications on file as of 07/16/2019.    Allergies: Allergies  Allergen Reactions   Penicillins Other (See Comments)    Reaction unknown from childhood Has patient had a PCN reaction causing immediate rash, facial/tongue/throat swelling, SOB or lightheadedness with hypotension: unknown Has patient had a PCN reaction causing severe rash involving mucus membranes or skin necrosis: unknown  Has patient had a PCN reaction that required hospitalization: no Has patient had a PCN reaction occurring within the last 10 years: no If all of the above answers are "NO", then may proceed with Cephalosporin use.    Dextromethorphan Polistirex Er Other (See Comments)    Pt states caused "a lump" in her throat, making it difficult to swallow   Diflucan [Fluconazole]     Primidone interaction   Hydrocodone Nausea And Vomiting   Oxycodone-Acetaminophen Other (See Comments)    Made fingers tingle and go numb, started "feeling weird".    Tape Itching and Other (See Comments)    Bandaids - leaves red marks    Family History: Family History  Problem Relation Age of Onset   Emphysema Mother         smoker   Allergies Mother    Asthma Mother    Heart disease Mother        AMI as cause of death   COPD Mother    Asthma Father    Heart disease Father        AMI as cause of death   Diabetes Father    Asthma Brother    Heart disease Brother 38       heart failure   Lung cancer Maternal Grandfather        was a smoker   Cancer Maternal Grandfather        lung   Heart disease Paternal Grandmother    Heart disease Paternal Grandfather    Colon cancer Neg Hx     Social History: Social History   Socioeconomic History   Marital status: Divorced    Spouse name: Not on file   Number of children: 1   Years of education: Not on file   Highest education level: Not on file  Occupational History   Occupation: Restaurant manager, fast food: [employer]  Tobacco Use   Smoking status: Never Smoker   Smokeless tobacco: Never Used  Substance and Sexual Activity   Alcohol use: Not Currently    Comment: occ   Drug use: No    Comment: former maryjuana use- quit in 1995   Sexual activity: Yes  Other Topics Concern   Not on file  Social History Narrative   Marital status: divorced x 2; dating seriously x 3 years.  Boyfriend with HIV.       Children:  1 daughter (39); no grandchildren      Lives:  With boyfriend.        Employment: works at [employer]: none      Alcohol: rarely; socially      Exercise: none; has treadmill and elliptical and bikes   Coffee in am / 1/2 of coke in afternoon    [employer] school education   Social Determinants of Health   Financial Resource Strain:    Difficulty of Paying Living Expenses: Not on file  Food Insecurity:    Worried  About Running Out of Food in the Last Year: Not on file   Ran Out of Food in the Last Year: Not on file  Transportation Needs:    Lack of Transportation (Medical): Not on file   Lack of Transportation (Non-Medical): Not on file  Physical Activity:    Days of Exercise  per Week: Not on file   Minutes of Exercise per Session: Not on file  Stress:    Feeling of Stress : Not on file  Social Connections:    Frequency of Communication with Friends and Family: Not on file   Frequency of Social Gatherings with Friends and Family: Not on file   Attends Religious Services: Not on file   Active Member of Clubs or Organizations: Not on file   Attends Archivist Meetings: Not on file   Marital Status: Not on file  Intimate Partner Violence:    Fear of Current or Ex-Partner: Not on file   Emotionally Abused: Not on file   Physically Abused: Not on file   Sexually Abused: Not on file    Observations/Objective:   Last menstrual period 10/11/2010. No acute distress.  Alert and oriented.  Speech fluent and not dysarthric.  Language intact.  Eyes orthophoric on primary gaze.  Face symmetric.  Assessment and Plan:   Essential Tremor Chronic pain  1.  She will continue primidone 100mg  for now.  Once her knee heals and she is off of the prescribed pain medication, she will increase dose to 100mg  in AM and 50mg  in PM.  2.  Follow up in 6 months.  Follow Up Instructions:    -I discussed the assessment and treatment plan with the patient. The patient was provided an opportunity to ask questions and all were answered. The patient agreed with the plan and demonstrated an understanding of the instructions.   The patient was advised to call back or seek an in-person evaluation if the symptoms worsen or if the condition fails to improve as anticipated.    Dudley Major, DO

## 2019-07-16 ENCOUNTER — Encounter: Payer: Self-pay | Admitting: Physical Therapy

## 2019-07-16 ENCOUNTER — Ambulatory Visit: Payer: PRIVATE HEALTH INSURANCE | Admitting: Physical Therapy

## 2019-07-16 ENCOUNTER — Telehealth (INDEPENDENT_AMBULATORY_CARE_PROVIDER_SITE_OTHER): Payer: No Typology Code available for payment source | Admitting: Neurology

## 2019-07-16 ENCOUNTER — Encounter: Payer: Self-pay | Admitting: Neurology

## 2019-07-16 ENCOUNTER — Other Ambulatory Visit: Payer: Self-pay

## 2019-07-16 ENCOUNTER — Encounter: Payer: No Typology Code available for payment source | Admitting: Physical Therapy

## 2019-07-16 DIAGNOSIS — G8929 Other chronic pain: Secondary | ICD-10-CM

## 2019-07-16 DIAGNOSIS — M542 Cervicalgia: Secondary | ICD-10-CM

## 2019-07-16 DIAGNOSIS — G250 Essential tremor: Secondary | ICD-10-CM

## 2019-07-16 DIAGNOSIS — Z79891 Long term (current) use of opiate analgesic: Secondary | ICD-10-CM

## 2019-07-16 DIAGNOSIS — R2689 Other abnormalities of gait and mobility: Secondary | ICD-10-CM

## 2019-07-16 DIAGNOSIS — R293 Abnormal posture: Secondary | ICD-10-CM

## 2019-07-16 DIAGNOSIS — R2681 Unsteadiness on feet: Secondary | ICD-10-CM | POA: Diagnosis not present

## 2019-07-16 DIAGNOSIS — R600 Localized edema: Secondary | ICD-10-CM

## 2019-07-16 DIAGNOSIS — R52 Pain, unspecified: Secondary | ICD-10-CM

## 2019-07-16 DIAGNOSIS — Z96652 Presence of left artificial knee joint: Secondary | ICD-10-CM

## 2019-07-16 NOTE — Therapy (Signed)
[facility] [address] [place], [place], [address] Phone: [phone]   Fax:  [phone]  Physical Therapy Treatment  Patient Details  Name: Sharon [name] MRN: [id number] Date of Birth: 01-05-1966 Referring Provider (PT): Jean Rosenthal, MD   Encounter Date: 07/16/2019  PT End of Session - 07/16/19 1407    Visit Number  18    Number of Visits  19    Date for PT Re-Evaluation  07/11/19    Authorization Type  [employer] - 30 visits beginning 2021 - track visits below    Authorization Time Period  MET deductible,  100% covered for 2020    PT Start Time  1403    PT Stop Time  1503    PT Time Calculation (min)  60 min    Activity Tolerance  Patient tolerated treatment well    Behavior During Therapy  [facility] for tasks assessed/performed       Past Medical History:  Diagnosis Date  . Allergy   . Anal fissure   . Anemia    borderline  . Anxiety   . Arthritis   . Blood clot in vein    left leg  . Detached retina    BOTH SCLERA BUCKLE BOTH EYES  . DVT (deep venous thrombosis) (HCC)    left leg  . Endometriosis   . Family history of adverse reaction to anesthesia    DAUGHTER POST OP PONV  . GERD (gastroesophageal reflux disease)   . Glaucoma    RIGHT WORST THAN LEFT  . Heart murmur    "caused by anxiety"   . History of hemorrhoids   . History of kidney stones   . Hypertension   . IBS (irritable bowel syndrome)   . Perforated eardrum, right    SMALL HOLE  . PONV (postoperative nausea and vomiting)   . Pulmonary embolism ([place]) 6 YRS AGO    Past Surgical History:  Procedure Laterality Date  . ABDOMINAL HYSTERECTOMY    . CATARACT EXTRACTION Bilateral 2015  . COLONOSCOPY    . CYSTOSCOPY W/ URETERAL STENT PLACEMENT Right 05/23/2017   Procedure: CYSTOSCOPY WITH RETROGRADE PYELOGRAM/URETERAL RIGHT STENT PLACEMENT;  Surgeon: Bjorn Loser, MD;  [facility];  Service: Urology;  Laterality:  Right;  . CYSTOSCOPY W/ URETERAL STENT PLACEMENT Right 06/04/2017   Procedure: CYSTOSCOPY WITHRIGHT RETROGRADE PYELOGRAMRIGHT /URETEREOSCOPY  STENT PLACEMENT;  Surgeon: Lucas Mallow, MD;  Location: [facility];  Service: Urology;  Laterality: Right;  . ENDOMETRIAL ABLATION    . EYE SURGERY Bilateral    cataract surgery with lens implants  . [place]  2011  . INCONTINENCE SURGERY    . INNER EAR SURGERY     X 2  . KNEE SURGERY    . RECTAL PROLAPSE REPAIR    . RETINAL DETACHMENT SURGERY Bilateral 2000  . RETINAL DETACHMENT SURGERY Bilateral   . TOTAL KNEE ARTHROPLASTY Left 04/29/2019  . TOTAL KNEE ARTHROPLASTY Left 04/29/2019   Procedure: LEFT TOTAL KNEE ARTHROPLASTY;  Surgeon: Mcarthur Rossetti, MD;  Location: [place];  Service: Orthopedics;  Laterality: Left;  . TUBAL LIGATION    . VEIN SURGERY Left 04/2010    There were no vitals filed for this visit.  Subjective Assessment - 07/16/19 1409    Subjective  I was in so much pain after the stretching last session. I have been crying since.    Pertinent History  right shoulder impingement, cervicalgia, right Trochanteric bursitis, dyspnea,  HTN,    Currently in Pain?  Yes    Pain Score  8     Pain Location  Knee    Pain Orientation  Left    Pain Descriptors / Indicators  Tightness;Constant;Heaviness    Aggravating Factors   When I stretch    Pain Relieving Factors  meds, ice    Multiple Pain Sites  No                       [facility] Adult PT Treatment/Exercise - 07/16/19 0001      Knee/Hip Exercises: Aerobic   Elliptical  L1 R1 x 3 min    Nustep  Moved seat up to 7: L1 x 10 min      Knee/Hip Exercises: Standing   Forward Step Up  --   Black pad 10x, first step 10x,    Forward Step Up Limitations  VC to bend knee > flexing hip     Functional Squat  2 sets;10 reps    Functional Squat Limitations  Sit to red ball ( PTA holding)     Rebounder  weight shifting 1 min 3 ways       Manual Therapy   Joint Mobilization  Gd 3 mobs for flexion with [place]am Strap    Soft tissue mobilization  scar massage with horizontal fascial pulling     Passive ROM  Gentle PROM for knee flexion in sitting 2x10                  PT Long Term Goals - 07/03/19 1502      PT LONG TERM GOAL #1   Title  PROM Left knee flexion 120 degrees    Baseline  108 (07/03/19)    Status  On-going      PT LONG TERM GOAL #2   Title  PROM Left knee extension -10    Baseline  appears to be about -10 with PROM, but did not measure today    Status  On-going      PT LONG TERM GOAL #3   Title  Single leg stance LLE >/= 5 sec    Baseline  very weak quad strength    Status  On-going            Plan - 07/16/19 1412    Clinical Impression Statement  pt arrives today with complaints of increased pain, swelling, and tightness ( inside the knee). Pt has very poor awareness/proprioception of her LTLE. Todays session we focused mainly on TE and less on aggressive stretching that may be too aggrevating to the tissue.  Pt had 100 degress of PROM at end of session.    Personal Factors and Comorbidities  Comorbidity 3+;Fitness;Past/Current Experience    Comorbidities  right shoulder impingement, cervicalgia, right Trochanteric bursitis, dyspnea, HTN    Examination-Activity Limitations  Bend;Lift;Locomotion Level;Stand;Stairs;Transfers    Examination-Participation Restrictions  Community Activity    Stability/Clinical Decision Making  Stable/Uncomplicated    Rehab Potential  Good    PT Frequency  3x / week    PT Duration  6 weeks    PT Treatment/Interventions  ADLs/Self Care Home Management;Electrical Stimulation;Moist Heat;DME Instruction;Gait training;Stair training;Functional mobility training;Therapeutic activities;Therapeutic exercise;Balance training;Neuromuscular re-education;Patient/family education;Manual techniques;Scar mobilization;Compression bandaging;Passive range of motion;Dry  needling;Taping;Vasopneumatic Device;Joint Manipulations    PT Home Exercise Plan  [facility]    Consulted and Agree with Plan of Care  Patient       Patient will benefit from skilled therapeutic intervention  in order to improve the following deficits and impairments:  Abnormal gait, Decreased activity tolerance, Decreased balance, Decreased endurance, Decreased mobility, Decreased range of motion, Decreased skin integrity, Decreased scar mobility, Decreased strength, Increased edema, Impaired flexibility, Postural dysfunction, Pain  Visit Diagnosis: Unsteadiness on feet  Other abnormalities of gait and mobility  Abnormal posture  Localized edema     Problem List Patient Active Problem List   Diagnosis Date Noted  . Status post total left knee replacement 04/29/2019  . Impingement syndrome of right shoulder 10/28/2018  . Chronic pain of left knee 03/27/2018  . Chronic right shoulder pain 03/27/2018  . Cervicalgia 03/27/2018  . Exposure of implanted vaginal mesh ([place]) 03/06/2018  . Outlet dysfunction constipation 03/06/2018  . Status post arthroscopy of left knee 08/06/2017  . Unilateral primary osteoarthritis, left knee 08/06/2017  . Tremor 07/10/2017  . Other meniscus derangements, posterior horn of medial meniscus, left knee 06/28/2017  . Ureteral stone 05/23/2017  . History of sepsis 05/23/2017  . Acute pain of left knee 04/09/2017  . Trochanteric bursitis, right hip 09/27/2016  . GAD (generalized anxiety disorder) 05/31/2014  . Rectocele 08/21/2011  . Uterovaginal prolapse 08/21/2011  . Dyspnea 10/13/2010  . Hypertension 10/13/2010    Madina Galati, PTA 07/16/2019, 2:57 PM  [facility] [address], [place], [place], [address] Phone: [phone]   Fax:  [phone]  Name: Nakoma Gotwalt MRN: [id number] Date of Birth: Jul 10, 1965

## 2019-07-17 ENCOUNTER — Encounter: Payer: Self-pay | Admitting: Physical Therapy

## 2019-07-17 ENCOUNTER — Ambulatory Visit: Payer: PRIVATE HEALTH INSURANCE | Admitting: Physical Therapy

## 2019-07-17 DIAGNOSIS — R293 Abnormal posture: Secondary | ICD-10-CM

## 2019-07-17 DIAGNOSIS — R2681 Unsteadiness on feet: Secondary | ICD-10-CM

## 2019-07-17 DIAGNOSIS — G8929 Other chronic pain: Secondary | ICD-10-CM

## 2019-07-17 DIAGNOSIS — R2689 Other abnormalities of gait and mobility: Secondary | ICD-10-CM

## 2019-07-17 DIAGNOSIS — R600 Localized edema: Secondary | ICD-10-CM

## 2019-07-17 NOTE — Therapy (Signed)
[facility] [address] [place], [place], [address] Phone: [phone]   Fax:  [phone]  Physical Therapy Treatment  Patient Details  Name: Sharon [name] MRN: [id number] Date of Birth: 24-Oct-1965 Referring Provider (PT): Jean Rosenthal, MD   Encounter Date: 07/17/2019  PT End of Session - 07/17/19 1445    Visit Number  19    Number of Visits  19    Date for PT Re-Evaluation  07/11/19    Authorization Type  Medcost - 30 visits beginning 2021 - track visits below    Authorization Time Period  MET deductible,  100% covered for 2020    PT Start Time  1402    PT Stop Time  1455    PT Time Calculation (min)  53 min    Activity Tolerance  Patient tolerated treatment well    Behavior During Therapy  [facility] for tasks assessed/performed       Past Medical History:  Diagnosis Date  . Allergy   . Anal fissure   . Anemia    borderline  . Anxiety   . Arthritis   . Blood clot in vein    left leg  . Detached retina    BOTH SCLERA BUCKLE BOTH EYES  . DVT (deep venous thrombosis) (HCC)    left leg  . Endometriosis   . Family history of adverse reaction to anesthesia    DAUGHTER POST OP PONV  . GERD (gastroesophageal reflux disease)   . Glaucoma    RIGHT WORST THAN LEFT  . Heart murmur    "caused by anxiety"   . History of hemorrhoids   . History of kidney stones   . Hypertension   . IBS (irritable bowel syndrome)   . Perforated eardrum, right    SMALL HOLE  . PONV (postoperative nausea and vomiting)   . Pulmonary embolism ([place]) 6 YRS AGO    Past Surgical History:  Procedure Laterality Date  . ABDOMINAL HYSTERECTOMY    . CATARACT EXTRACTION Bilateral 2015  . COLONOSCOPY    . CYSTOSCOPY W/ URETERAL STENT PLACEMENT Right 05/23/2017   Procedure: CYSTOSCOPY WITH RETROGRADE PYELOGRAM/URETERAL RIGHT STENT PLACEMENT;  Surgeon: Bjorn Loser, MD;  [facility];  Service: Urology;  Laterality:  Right;  . CYSTOSCOPY W/ URETERAL STENT PLACEMENT Right 06/04/2017   Procedure: CYSTOSCOPY WITHRIGHT RETROGRADE PYELOGRAMRIGHT /URETEREOSCOPY  STENT PLACEMENT;  Surgeon: Lucas Mallow, MD;  Location: [facility];  Service: Urology;  Laterality: Right;  . ENDOMETRIAL ABLATION    . EYE SURGERY Bilateral    cataract surgery with lens implants  . [place]  2011  . INCONTINENCE SURGERY    . INNER EAR SURGERY     X 2  . KNEE SURGERY    . RECTAL PROLAPSE REPAIR    . RETINAL DETACHMENT SURGERY Bilateral 2000  . RETINAL DETACHMENT SURGERY Bilateral   . TOTAL KNEE ARTHROPLASTY Left 04/29/2019  . TOTAL KNEE ARTHROPLASTY Left 04/29/2019   Procedure: LEFT TOTAL KNEE ARTHROPLASTY;  Surgeon: Mcarthur Rossetti, MD;  Location: [place];  Service: Orthopedics;  Laterality: Left;  . TUBAL LIGATION    . VEIN SURGERY Left 04/2010    There were no vitals filed for this visit.  Subjective Assessment - 07/17/19 1406    Subjective  Pt states that her knee is a little better this week but she is taking it easy to allow the inflammation to calm down.    Pertinent History  right shoulder impingement, cervicalgia, right Trochanteric bursitis, dyspnea, HTN,    Currently in Pain?  Yes    Pain Score  6     Pain Location  Knee    Pain Orientation  Left    Pain Descriptors / Indicators  Sore;Tightness    Pain Type  Chronic pain    Pain Radiating Towards  none    Pain Onset  More than a month ago    Pain Frequency  Intermittent    Aggravating Factors   bending the knee too much         [facility] PT Assessment - 07/17/19 0001      PROM   Left Knee Flexion  105   85 start of session                  [facility] Adult PT Treatment/Exercise - 07/17/19 0001      Knee/Hip Exercises: Aerobic   Stationary Bike  slow: forward revolutions x5 minutes, PT encouraging seat closer intermittently as ROM improved       Vasopneumatic   Number Minutes Vasopneumatic   10 minutes     Vasopnuematic Location   Knee    Vasopneumatic Pressure  Medium    Vasopneumatic Temperature   3 flakes      Manual Therapy   Joint Mobilization  Lt proximal fibular head AP/PA mobs grade III-IV x4 bouts; Lt tibiofemoral AP mobilization with internal rotation and 5# weight x4 bouts and range flexion    Soft tissue mobilization  STM along Lt ITB/lateral quadriceps    Passive ROM  passive Lt knee flexion/extension x20 reps              PT Education - 07/17/19 1550    Education Details  implications for manual treatment    Person(s) Educated  Patient    Methods  Explanation;Verbal cues    Comprehension  Verbalized understanding          PT Long Term Goals - 07/03/19 1502      PT LONG TERM GOAL #1   Title  PROM Left knee flexion 120 degrees    Baseline  108 (07/03/19)    Status  On-going      PT LONG TERM GOAL #2   Title  PROM Left knee extension -10    Baseline  appears to be about -10 with PROM, but did not measure today    Status  On-going      PT LONG TERM GOAL #3   Title  Single leg stance LLE >/= 5 sec    Baseline  very weak quad strength    Status  On-going            Plan - 07/17/19 1552    Clinical Impression Statement  Pt arrived with less than 90 deg of knee flexion, noting what she feels is increased inflammation this past week following her injection. PT focused on increasing knee flexion ROM addressing tibiofemoral mobility and proximal fibular head mobility. Pt has lateral knee pain also up to the lateral hip and would benefit from further work on the fibular head/ITB/lateral quadriceps moving forward. End of session pt's ROM increased by 10 deg. She was encouraged to maintain consistency with her ROM exercises between sessions to avoid worsening of ROM.    Personal Factors and Comorbidities  Comorbidity 3+;Fitness;Past/Current Experience    Comorbidities  right shoulder impingement, cervicalgia, right Trochanteric bursitis, dyspnea, HTN     Examination-Activity Limitations  Bend;Lift;Locomotion Level;Stand;Stairs;Transfers  Examination-Participation Restrictions  Community Activity    Stability/Clinical Decision Making  Stable/Uncomplicated    Rehab Potential  Good    PT Frequency  3x / week    PT Duration  6 weeks    PT Treatment/Interventions  ADLs/Self Care Home Management;Electrical Stimulation;Moist Heat;DME Instruction;Gait training;Stair training;Functional mobility training;Therapeutic activities;Therapeutic exercise;Balance training;Neuromuscular re-education;Patient/family education;Manual techniques;Scar mobilization;Compression bandaging;Passive range of motion;Dry needling;Taping;Vasopneumatic Device;Joint Manipulations    PT Next Visit Plan  fibular head mobs; patellar mobs; f/u on HEP and update to ensure pt isn't picking and choosing exercises    PT Home Exercise Plan  [facility]    Consulted and Agree with Plan of Care  Patient       Patient will benefit from skilled therapeutic intervention in order to improve the following deficits and impairments:  Abnormal gait, Decreased activity tolerance, Decreased balance, Decreased endurance, Decreased mobility, Decreased range of motion, Decreased skin integrity, Decreased scar mobility, Decreased strength, Increased edema, Impaired flexibility, Postural dysfunction, Pain  Visit Diagnosis: Unsteadiness on feet  Other abnormalities of gait and mobility  Abnormal posture  Localized edema  Chronic pain of left knee     Problem List Patient Active Problem List   Diagnosis Date Noted  . Status post total left knee replacement 04/29/2019  . Impingement syndrome of right shoulder 10/28/2018  . Chronic pain of left knee 03/27/2018  . Chronic right shoulder pain 03/27/2018  . Cervicalgia 03/27/2018  . Exposure of implanted vaginal mesh ([place]) 03/06/2018  . Outlet dysfunction constipation 03/06/2018  . Status post arthroscopy of left knee 08/06/2017  . Unilateral  primary osteoarthritis, left knee 08/06/2017  . Tremor 07/10/2017  . Other meniscus derangements, posterior horn of medial meniscus, left knee 06/28/2017  . Ureteral stone 05/23/2017  . History of sepsis 05/23/2017  . Acute pain of left knee 04/09/2017  . Trochanteric bursitis, right hip 09/27/2016  . GAD (generalized anxiety disorder) 05/31/2014  . Rectocele 08/21/2011  . Uterovaginal prolapse 08/21/2011  . Dyspnea 10/13/2010  . Hypertension 10/13/2010    4:15 PM,07/17/19 [place], DPT [place] [facility] [address], [place], [place], [address] Phone: [phone]   Fax:  [phone]  Name: Sharon [name] MRN: [id number] Date of Birth: October 13, 1965

## 2019-07-18 ENCOUNTER — Encounter: Payer: No Typology Code available for payment source | Admitting: Physical Therapy

## 2019-07-21 ENCOUNTER — Ambulatory Visit: Payer: No Typology Code available for payment source | Attending: Orthopaedic Surgery | Admitting: Physical Therapy

## 2019-07-21 ENCOUNTER — Other Ambulatory Visit: Payer: Self-pay

## 2019-07-21 DIAGNOSIS — R2689 Other abnormalities of gait and mobility: Secondary | ICD-10-CM | POA: Insufficient documentation

## 2019-07-21 DIAGNOSIS — R293 Abnormal posture: Secondary | ICD-10-CM | POA: Diagnosis present

## 2019-07-21 DIAGNOSIS — R2681 Unsteadiness on feet: Secondary | ICD-10-CM

## 2019-07-21 DIAGNOSIS — M6281 Muscle weakness (generalized): Secondary | ICD-10-CM | POA: Diagnosis present

## 2019-07-21 DIAGNOSIS — R600 Localized edema: Secondary | ICD-10-CM | POA: Diagnosis present

## 2019-07-21 DIAGNOSIS — M25662 Stiffness of left knee, not elsewhere classified: Secondary | ICD-10-CM | POA: Diagnosis present

## 2019-07-21 DIAGNOSIS — G8929 Other chronic pain: Secondary | ICD-10-CM | POA: Diagnosis present

## 2019-07-21 DIAGNOSIS — M25562 Pain in left knee: Secondary | ICD-10-CM | POA: Diagnosis present

## 2019-07-21 NOTE — Therapy (Signed)
[facility] [address], [place], [place], [address] Phone: [phone]   Fax:  [phone]  Physical Therapy Treatment  Patient Details  Name: Sharon [name] MRN: [id number] Date of Birth: 10-05-1965 Referring Provider (PT): Jean Rosenthal, MD   Encounter Date: 07/21/2019  PT End of Session - 07/21/19 1449    Visit Number  20    Date for PT Re-Evaluation  09/04/19    Authorization Type  Medcost - 30 visits beginning 2021 - track visits below    Authorization Time Period  MET deductible,  100% covered for 2020    Authorization - Visit Number  9    Authorization - Number of Visits  30    PT Start Time  6333    PT Stop Time  1540    PT Time Calculation (min)  54 min    Activity Tolerance  Patient tolerated treatment well    Behavior During Therapy  [facility] for tasks assessed/performed       Past Medical History:  Diagnosis Date  . Allergy   . Anal fissure   . Anemia    borderline  . Anxiety   . Arthritis   . Blood clot in vein    left leg  . Detached retina    BOTH SCLERA BUCKLE BOTH EYES  . DVT (deep venous thrombosis) (HCC)    left leg  . Endometriosis   . Family history of adverse reaction to anesthesia    DAUGHTER POST OP PONV  . GERD (gastroesophageal reflux disease)   . Glaucoma    RIGHT WORST THAN LEFT  . Heart murmur    "caused by anxiety"   . History of hemorrhoids   . History of kidney stones   . Hypertension   . IBS (irritable bowel syndrome)   . Perforated eardrum, right    SMALL HOLE  . PONV (postoperative nausea and vomiting)   . Pulmonary embolism ([place]) 6 YRS AGO    Past Surgical History:  Procedure Laterality Date  . ABDOMINAL HYSTERECTOMY    . CATARACT EXTRACTION Bilateral 2015  . COLONOSCOPY    . CYSTOSCOPY W/ URETERAL STENT PLACEMENT Right 05/23/2017   Procedure: CYSTOSCOPY WITH RETROGRADE PYELOGRAM/URETERAL RIGHT STENT PLACEMENT;  Surgeon: Bjorn Loser, MD;   [facility];  Service: Urology;  Laterality: Right;  . CYSTOSCOPY W/ URETERAL STENT PLACEMENT Right 06/04/2017   Procedure: CYSTOSCOPY WITHRIGHT RETROGRADE PYELOGRAMRIGHT /URETEREOSCOPY  STENT PLACEMENT;  Surgeon: Lucas Mallow, MD;  Location: [facility];  Service: Urology;  Laterality: Right;  . ENDOMETRIAL ABLATION    . EYE SURGERY Bilateral    cataract surgery with lens implants  . [place]  2011  . INCONTINENCE SURGERY    . INNER EAR SURGERY     X 2  . KNEE SURGERY    . RECTAL PROLAPSE REPAIR    . RETINAL DETACHMENT SURGERY Bilateral 2000  . RETINAL DETACHMENT SURGERY Bilateral   . TOTAL KNEE ARTHROPLASTY Left 04/29/2019  . TOTAL KNEE ARTHROPLASTY Left 04/29/2019   Procedure: LEFT TOTAL KNEE ARTHROPLASTY;  Surgeon: Mcarthur Rossetti, MD;  Location: [place];  Service: Orthopedics;  Laterality: Left;  . TUBAL LIGATION    . VEIN SURGERY Left 04/2010    There were no vitals filed for this visit.  Subjective Assessment - 07/21/19 1509    Subjective  Pt reports she is stiff today.  Pt states every since she got a shot in the knee it  has been more painful.    Currently in Pain?  Yes    Pain Score  7     Pain Location  Knee    Pain Orientation  Left                       [facility] Adult PT Treatment/Exercise - 07/21/19 0001      Knee/Hip Exercises: Stretches   Active Hamstring Stretch  Left;3 reps;20 seconds   standing     Knee/Hip Exercises: Aerobic   Nustep  L3 seat 9 x 10 min      Knee/Hip Exercises: Machines for Strengthening   Cybex Leg Press  Seat 7: LTLE eccentrics 40# 2x20   needs to use RtLE to assisst     Knee/Hip Exercises: Standing   Other Standing Knee Exercises  weight shifting to brief single leg stand on Lt LE - 30x      Vasopneumatic   Number Minutes Vasopneumatic   15 minutes    Vasopnuematic Location   Knee    Vasopneumatic Pressure  Medium    Vasopneumatic Temperature   3 flakes      Manual  Therapy   Soft tissue mobilization  STM along Lt ITB/lateral quadriceps; TFL                  PT Long Term Goals - 07/03/19 1502      PT LONG TERM GOAL #1   Title  PROM Left knee flexion 120 degrees    Baseline  108 (07/03/19)    Status  On-going      PT LONG TERM GOAL #2   Title  PROM Left knee extension -10    Baseline  appears to be about -10 with PROM, but did not measure today    Status  On-going      PT LONG TERM GOAL #3   Title  Single leg stance LLE >/= 5 sec    Baseline  very weak quad strength    Status  On-going            Plan - 07/21/19 1526    Clinical Impression Statement  Pt was sore today and noted that she was more stiff and having more pain since an injection last week.  Pt was able to get 102 deg knee flexion after several warm up stretches.  Soft tissue work was done to release TFL and IT band on the left today.  Pt tolerated exercises. She continues to need skilled PT to improve ROM and continue to progress quad strength.    Comorbidities  right shoulder impingement, cervicalgia, right Trochanteric bursitis, dyspnea, HTN    PT Treatment/Interventions  ADLs/Self Care Home Management;Electrical Stimulation;Moist Heat;DME Instruction;Gait training;Stair training;Functional mobility training;Therapeutic activities;Therapeutic exercise;Balance training;Neuromuscular re-education;Patient/family education;Manual techniques;Scar mobilization;Compression bandaging;Passive range of motion;Dry needling;Taping;Vasopneumatic Device;Joint Manipulations    PT Next Visit Plan  fibular head mobs; patellar mobs; f/u on HEP and update; quad strength    PT Home Exercise Plan  [facility]    Consulted and Agree with Plan of Care  Patient       Patient will benefit from skilled therapeutic intervention in order to improve the following deficits and impairments:  Abnormal gait, Decreased activity tolerance, Decreased balance, Decreased endurance, Decreased mobility,  Decreased range of motion, Decreased skin integrity, Decreased scar mobility, Decreased strength, Increased edema, Impaired flexibility, Postural dysfunction, Pain  Visit Diagnosis: Unsteadiness on feet  Other abnormalities of gait and mobility  Abnormal posture  Localized  edema  Chronic pain of left knee  Muscle weakness  Stiffness of left knee, not elsewhere classified     Problem List Patient Active Problem List   Diagnosis Date Noted  . Status post total left knee replacement 04/29/2019  . Impingement syndrome of right shoulder 10/28/2018  . Chronic pain of left knee 03/27/2018  . Chronic right shoulder pain 03/27/2018  . Cervicalgia 03/27/2018  . Exposure of implanted vaginal mesh ([place]) 03/06/2018  . Outlet dysfunction constipation 03/06/2018  . Status post arthroscopy of left knee 08/06/2017  . Unilateral primary osteoarthritis, left knee 08/06/2017  . Tremor 07/10/2017  . Other meniscus derangements, posterior horn of medial meniscus, left knee 06/28/2017  . Ureteral stone 05/23/2017  . History of sepsis 05/23/2017  . Acute pain of left knee 04/09/2017  . Trochanteric bursitis, right hip 09/27/2016  . GAD (generalized anxiety disorder) 05/31/2014  . Rectocele 08/21/2011  . Uterovaginal prolapse 08/21/2011  . Dyspnea 10/13/2010  . Hypertension 10/13/2010    Jule Ser, PT 07/21/2019, 5:08 PM  [facility] [address] [place], [place], [address] Phone: [phone]   Fax:  [phone]  Name: Shealyn Sean MRN: [id number] Date of Birth: 07-06-1965

## 2019-07-23 ENCOUNTER — Other Ambulatory Visit: Payer: Self-pay

## 2019-07-23 ENCOUNTER — Ambulatory Visit: Payer: No Typology Code available for payment source | Admitting: Physical Therapy

## 2019-07-23 ENCOUNTER — Encounter: Payer: Self-pay | Admitting: Physical Therapy

## 2019-07-23 DIAGNOSIS — R600 Localized edema: Secondary | ICD-10-CM

## 2019-07-23 DIAGNOSIS — R2681 Unsteadiness on feet: Secondary | ICD-10-CM

## 2019-07-23 DIAGNOSIS — G8929 Other chronic pain: Secondary | ICD-10-CM

## 2019-07-23 DIAGNOSIS — R2689 Other abnormalities of gait and mobility: Secondary | ICD-10-CM

## 2019-07-23 DIAGNOSIS — M6281 Muscle weakness (generalized): Secondary | ICD-10-CM

## 2019-07-23 DIAGNOSIS — M25562 Pain in left knee: Secondary | ICD-10-CM

## 2019-07-23 DIAGNOSIS — R293 Abnormal posture: Secondary | ICD-10-CM

## 2019-07-23 DIAGNOSIS — M25662 Stiffness of left knee, not elsewhere classified: Secondary | ICD-10-CM

## 2019-07-23 NOTE — Therapy (Signed)
[facility] [address] [name], [place], [address] Phone: [phone]   Fax:  [phone]  Physical Therapy Treatment  Patient Details  Name: Sharon [name] MRN: [id number] Date of Birth: [birth year] Referring Provider (PT): Jean Rosenthal, MD   Encounter Date: 07/23/2019  PT End of Session - 07/23/19 1451    Visit Number  21    Date for PT Re-Evaluation  09/04/19    Authorization Type  [employer] - 30 visits beginning 2021 - track visits below    Authorization - Visit Number  10    Authorization - Number of Visits  30    PT Start Time  1450    PT Stop Time  1540    PT Time Calculation (min)  50 min    Activity Tolerance  Patient tolerated treatment well    Behavior During Therapy  [facility] for tasks assessed/performed       Past Medical History:  Diagnosis Date  . Allergy   . Anal fissure   . Anemia    borderline  . Anxiety   . Arthritis   . Blood clot in vein    left leg  . Detached retina    BOTH SCLERA BUCKLE BOTH EYES  . DVT (deep venous thrombosis) (HCC)    left leg  . Endometriosis   . Family history of adverse reaction to anesthesia    DAUGHTER POST OP PONV  . GERD (gastroesophageal reflux disease)   . Glaucoma    RIGHT WORST THAN LEFT  . Heart murmur    "caused by anxiety"   . History of hemorrhoids   . History of kidney stones   . Hypertension   . IBS (irritable bowel syndrome)   . Perforated eardrum, right    SMALL HOLE  . PONV (postoperative nausea and vomiting)   . Pulmonary embolism ([place]) 6 YRS AGO    Past Surgical History:  Procedure Laterality Date  . ABDOMINAL HYSTERECTOMY    . CATARACT EXTRACTION Bilateral 2015  . COLONOSCOPY    . CYSTOSCOPY W/ URETERAL STENT PLACEMENT Right 05/23/2017   Procedure: CYSTOSCOPY WITH RETROGRADE PYELOGRAM/URETERAL RIGHT STENT PLACEMENT;  Surgeon: Bjorn Loser, MD;  [facility];  Service: Urology;  Laterality: Right;  . CYSTOSCOPY  W/ URETERAL STENT PLACEMENT Right 06/04/2017   Procedure: CYSTOSCOPY WITHRIGHT RETROGRADE PYELOGRAMRIGHT /URETEREOSCOPY  STENT PLACEMENT;  Surgeon: Lucas Mallow, MD;  Location[facility];  Service: Urology;  Laterality: Right;  . ENDOMETRIAL ABLATION    . EYE SURGERY Bilateral    cataract surgery with lens implants  . [place]  2011  . INCONTINENCE SURGERY    . INNER EAR SURGERY     X 2  . KNEE SURGERY    . RECTAL PROLAPSE REPAIR    . RETINAL DETACHMENT SURGERY Bilateral 2000  . RETINAL DETACHMENT SURGERY Bilateral   . TOTAL KNEE ARTHROPLASTY Left 04/29/2019  . TOTAL KNEE ARTHROPLASTY Left 04/29/2019   Procedure: LEFT TOTAL KNEE ARTHROPLASTY;  Surgeon: Mcarthur Rossetti, MD;  Location: [place];  Service: Orthopedics;  Laterality: Left;  . TUBAL LIGATION    . VEIN SURGERY Left 04/2010    There were no vitals filed for this visit.  Subjective Assessment - 07/23/19 1503    Subjective  Pt states she is still taking pain meds when doing the stretching at home    Patient Stated Goals  To walk without cane or pain.    Currently in Pain?  Yes    Pain Score  6     Pain Location  Knee    Pain Orientation  Left    Pain Descriptors / Indicators  Sore    Pain Type  Chronic pain    Pain Onset  More than a month ago    Pain Frequency  Intermittent    Multiple Pain Sites  No                       [facility] Adult PT Treatment/Exercise - 07/23/19 0001      Knee/Hip Exercises: Aerobic   Nustep  L4 seat 11 x 10 min   seat farther back     Knee/Hip Exercises: Standing   SLS  5x to tolerance on Lt LE - able to get up to 13 sec no UE support      Knee/Hip Exercises: Seated   Long Arc Quad  Strengthening;Left;20 reps;Weights    Long Arc Quad Weight  2 lbs.    Hamstring Curl  Strengthening;Left;2 sets;10 reps    Hamstring Limitations  green      Vasopneumatic   Number Minutes Vasopneumatic   15 minutes    Vasopnuematic Location   Knee     Vasopneumatic Pressure  Medium    Vasopneumatic Temperature   3 flakes      Manual Therapy   Soft tissue mobilization  STM at distal quad    Passive ROM  passive knee flex in prone                  PT Long Term Goals - 07/23/19 1459      PT LONG TERM GOAL #1   Title  PROM Left knee flexion 120 degrees    Status  On-going      PT LONG TERM GOAL #2   Title  PROM Left knee extension -10    Status  On-going      PT LONG TERM GOAL #3   Title  Single leg stance LLE >/= 5 sec    Baseline  13 sec    Status  Achieved      PT LONG TERM GOAL #5   Title  Patient ambulates >400' without assistive device modified independent.    Baseline  able to walk without AD    Status  On-going      PT LONG TERM GOAL #6   Title  Patient negotiates stairs with single rail, ramps & curbs without assistive device modified independent.    Status  Achieved      PT LONG TERM GOAL #7   Title  Patient reports left knee pain </= 6/10 without prescription pain medications (using only over the counter meds)    Status  On-going            Plan - 07/23/19 1529    Clinical Impression Statement  Pt was able to reach 105 deg of knee flexion today.  She feels that she is still recovering from the injection.  Pt has met long term goals for ambulating up stairs and she is able to stand on single leg for 13 sec.  Pt continues to need skilled therapy for quad strength and ROM    PT Treatment/Interventions  ADLs/Self Care Home Management;Electrical Stimulation;Moist Heat;DME Instruction;Gait training;Stair training;Functional mobility training;Therapeutic activities;Therapeutic exercise;Balance training;Neuromuscular re-education;Patient/family education;Manual techniques;Scar mobilization;Compression bandaging;Passive range of motion;Dry needling;Taping;Vasopneumatic Device;Joint Manipulations    PT Next Visit Plan  fibular head mobs; patellar mobs; f/u on HEP  and update; quad strength    PT Home Exercise  Plan  [facility]    Consulted and Agree with Plan of Care  Patient       Patient will benefit from skilled therapeutic intervention in order to improve the following deficits and impairments:  Abnormal gait, Decreased activity tolerance, Decreased balance, Decreased endurance, Decreased mobility, Decreased range of motion, Decreased skin integrity, Decreased scar mobility, Decreased strength, Increased edema, Impaired flexibility, Postural dysfunction, Pain  Visit Diagnosis: Unsteadiness on feet  Other abnormalities of gait and mobility  Abnormal posture  Localized edema  Chronic pain of left knee  Muscle weakness  Stiffness of left knee, not elsewhere classified     Problem List Patient Active Problem List   Diagnosis Date Noted  . Status post total left knee replacement 04/29/2019  . Impingement syndrome of right shoulder 10/28/2018  . Chronic pain of left knee 03/27/2018  . Chronic right shoulder pain 03/27/2018  . Cervicalgia 03/27/2018  . Exposure of implanted vaginal mesh ([place]) 03/06/2018  . Outlet dysfunction constipation 03/06/2018  . Status post arthroscopy of left knee 08/06/2017  . Unilateral primary osteoarthritis, left knee 08/06/2017  . Tremor 07/10/2017  . Other meniscus derangements, posterior horn of medial meniscus, left knee 06/28/2017  . Ureteral stone 05/23/2017  . History of sepsis 05/23/2017  . Acute pain of left knee 04/09/2017  . Trochanteric bursitis, right hip 09/27/2016  . GAD (generalized anxiety disorder) 05/31/2014  . Rectocele 08/21/2011  . Uterovaginal prolapse 08/21/2011  . Dyspnea 10/13/2010  . Hypertension 10/13/2010    Jule Ser, PT 07/23/2019, 3:31 PM  [facility] [address] [place], [place], [address] Phone: [phone]   Fax:  [phone]  Name: [name] MRN: [id number] Date of Birth: 07-18-1965

## 2019-07-25 ENCOUNTER — Ambulatory Visit: Payer: PRIVATE HEALTH INSURANCE | Admitting: Family Medicine

## 2019-07-25 ENCOUNTER — Telehealth: Payer: Self-pay | Admitting: Orthopaedic Surgery

## 2019-07-25 MED ORDER — OXYCODONE HCL 5 MG PO TABS: 5.0000 mg | ORAL_TABLET | Freq: Four times a day (QID) | ORAL | 0 refills | Status: DC | PRN

## 2019-07-25 NOTE — Telephone Encounter (Signed)
Patient called and requested a refill of Oxycodone.  Please call patient @ [phone]

## 2019-07-25 NOTE — Telephone Encounter (Signed)
Please advise 

## 2019-07-25 NOTE — Telephone Encounter (Signed)
This is the last time I can provide oxy.  Will have to go to [place] next

## 2019-07-28 ENCOUNTER — Ambulatory Visit: Payer: No Typology Code available for payment source | Admitting: Physical Therapy

## 2019-07-28 ENCOUNTER — Other Ambulatory Visit: Payer: Self-pay

## 2019-07-28 ENCOUNTER — Encounter: Payer: Self-pay | Admitting: Physical Therapy

## 2019-07-28 DIAGNOSIS — R600 Localized edema: Secondary | ICD-10-CM

## 2019-07-28 DIAGNOSIS — R2681 Unsteadiness on feet: Secondary | ICD-10-CM

## 2019-07-28 DIAGNOSIS — R2689 Other abnormalities of gait and mobility: Secondary | ICD-10-CM

## 2019-07-28 DIAGNOSIS — R293 Abnormal posture: Secondary | ICD-10-CM

## 2019-07-28 NOTE — Therapy (Signed)
[facility] [address], [place], [place], [address] Phone: [phone]   Fax:  [phone]  Physical Therapy Treatment  Patient Details  Name: Sharon [name] MRN: [id number] Date of Birth: 10/22/1965 Referring Provider (PT): Jean Rosenthal, MD   Encounter Date: 07/28/2019  PT End of Session - 07/28/19 1456    Visit Number  22    Date for PT Re-Evaluation  09/04/19    Authorization Type  Med[employer] - 30 visits beginning 2021 - track visits below    Authorization Time Period  MET deductible,  100% covered for 2020    Authorization - Visit Number  11    Authorization - Number of Visits  30    PT Start Time  6237    PT Stop Time  1555    PT Time Calculation (min)  70 min    Activity Tolerance  Patient tolerated treatment well    Behavior During Therapy  [facility] for tasks assessed/performed       Past Medical History:  Diagnosis Date  . Allergy   . Anal fissure   . Anemia    borderline  . Anxiety   . Arthritis   . Blood clot in vein    left leg  . Detached retina    BOTH SCLERA BUCKLE BOTH EYES  . DVT (deep venous thrombosis) (HCC)    left leg  . Endometriosis   . Family history of adverse reaction to anesthesia    DAUGHTER POST OP PONV  . GERD (gastroesophageal reflux disease)   . Glaucoma    RIGHT WORST THAN LEFT  . Heart murmur    "caused by anxiety"   . History of hemorrhoids   . History of kidney stones   . Hypertension   . IBS (irritable bowel syndrome)   . Perforated eardrum, right    SMALL HOLE  . PONV (postoperative nausea and vomiting)   . Pulmonary embolism ([place]) 6 YRS AGO    Past Surgical History:  Procedure Laterality Date  . ABDOMINAL HYSTERECTOMY    . CATARACT EXTRACTION Bilateral 2015  . COLONOSCOPY    . CYSTOSCOPY W/ URETERAL STENT PLACEMENT Right 05/23/2017   Procedure: CYSTOSCOPY WITH RETROGRADE PYELOGRAM/URETERAL RIGHT STENT PLACEMENT;  Surgeon: Bjorn Loser, MD;   [facility];  Service: Urology;  Laterality: Right;  . CYSTOSCOPY W/ URETERAL STENT PLACEMENT Right 06/04/2017   Procedure: CYSTOSCOPY WITHRIGHT RETROGRADE PYELOGRAMRIGHT /URETEREOSCOPY  STENT PLACEMENT;  Surgeon: Lucas Mallow, MD;  Location: [facility];  Service: Urology;  Laterality: Right;  . ENDOMETRIAL ABLATION    . EYE SURGERY Bilateral    cataract surgery with lens implants  . [place]  2011  . INCONTINENCE SURGERY    . INNER EAR SURGERY     X 2  . KNEE SURGERY    . RECTAL PROLAPSE REPAIR    . RETINAL DETACHMENT SURGERY Bilateral 2000  . RETINAL DETACHMENT SURGERY Bilateral   . TOTAL KNEE ARTHROPLASTY Left 04/29/2019  . TOTAL KNEE ARTHROPLASTY Left 04/29/2019   Procedure: LEFT TOTAL KNEE ARTHROPLASTY;  Surgeon: Mcarthur Rossetti, MD;  Location: [place];  Service: Orthopedics;  Laterality: Left;  . TUBAL LIGATION    . VEIN SURGERY Left 04/2010    There were no vitals filed for this visit.  Subjective Assessment - 07/28/19 1458    Subjective  My pain and stiffness is up today. Not sur why. I did really good throughout the weekend.  Pertinent History  right shoulder impingement, cervicalgia, right Trochanteric bursitis, dyspnea, HTN,    Currently in Pain?  Yes    Pain Score  6     Pain Location  Hip   and thigh   Pain Orientation  Left    Pain Descriptors / Indicators  Aching    Aggravating Factors   bending teh knee too much    Pain Relieving Factors  meds, ice,    Multiple Pain Sites  No         [facility] PT Assessment - 07/28/19 0001      PROM   Left Knee Flexion  112   102 active                  [facility] Adult PT Treatment/Exercise - 07/28/19 0001      Knee/Hip Exercises: Aerobic   Nustep  L4 seat 9 x 10 min   seat farther forward     Knee/Hip Exercises: Machines for Strengthening   Cybex Leg Press  Seat 7: LT > RT 40# 20x, 45# 15x      Knee/Hip Exercises: Standing   Heel Raises  Both;1 set;20 reps     Functional Squat  2 sets;10 reps    Functional Squat Limitations  sit to red ball    Rebounder  weight shifting 1 min 3 ways, VC to keep knees very straight       Knee/Hip Exercises: Seated   Hamstring Curl  Strengthening;Left;2 sets;10 reps    Hamstring Limitations  green      Vasopneumatic   Number Minutes Vasopneumatic   15 minutes    Vasopnuematic Location   Knee    Vasopneumatic Pressure  Medium    Vasopneumatic Temperature   3 flakes      Manual Therapy   Soft tissue mobilization  STM at distal quad    Passive ROM  passive knee flex in prone   and in sitting                 PT Long Term Goals - 07/23/19 1459      PT LONG TERM GOAL #1   Title  PROM Left knee flexion 120 degrees    Status  On-going      PT LONG TERM GOAL #2   Title  PROM Left knee extension -10    Status  On-going      PT LONG TERM GOAL #3   Title  Single leg stance LLE >/= 5 sec    Baseline  13 sec    Status  Achieved      PT LONG TERM GOAL #5   Title  Patient ambulates >400' without assistive device modified independent.    Baseline  able to walk without AD    Status  On-going      PT LONG TERM GOAL #6   Title  Patient negotiates stairs with single rail, ramps & curbs without assistive device modified independent.    Status  Achieved      PT LONG TERM GOAL #7   Title  Patient reports left knee pain </= 6/10 without prescription pain medications (using only over the counter meds)    Status  On-going            Plan - 07/28/19 1457    Clinical Impression Statement  Pt arrives with complaints of knee pain and stiffness today but achieved 102 degrees easily and was easily pushed to 112 degrees flexion. This was quite  painful for pt  but the resistance was much less.    Personal Factors and Comorbidities  Comorbidity 3+;Fitness;Past/Current Experience    Comorbidities  right shoulder impingement, cervicalgia, right Trochanteric bursitis, dyspnea, HTN    Examination-Activity  Limitations  Bend;Lift;Locomotion Level;Stand;Stairs;Transfers    Examination-Participation Restrictions  Community Activity    Stability/Clinical Decision Making  Stable/Uncomplicated    Rehab Potential  Good    PT Frequency  3x / week    PT Duration  6 weeks    PT Treatment/Interventions  ADLs/Self Care Home Management;Electrical Stimulation;Moist Heat;DME Instruction;Gait training;Stair training;Functional mobility training;Therapeutic activities;Therapeutic exercise;Balance training;Neuromuscular re-education;Patient/family education;Manual techniques;Scar mobilization;Compression bandaging;Passive range of motion;Dry needling;Taping;Vasopneumatic Device;Joint Manipulations    PT Next Visit Plan  quad strength, fib head mobs, stretching for knee flexion    PT Home Exercise Plan  [facility]    Consulted and Agree with Plan of Care  Patient       Patient will benefit from skilled therapeutic intervention in order to improve the following deficits and impairments:  Abnormal gait, Decreased activity tolerance, Decreased balance, Decreased endurance, Decreased mobility, Decreased range of motion, Decreased skin integrity, Decreased scar mobility, Decreased strength, Increased edema, Impaired flexibility, Postural dysfunction, Pain  Visit Diagnosis: Unsteadiness on feet  Other abnormalities of gait and mobility  Abnormal posture  Localized edema     Problem List Patient Active Problem List   Diagnosis Date Noted  . Status post total left knee replacement 04/29/2019  . Impingement syndrome of right shoulder 10/28/2018  . Chronic pain of left knee 03/27/2018  . Chronic right shoulder pain 03/27/2018  . Cervicalgia 03/27/2018  . Exposure of implanted vaginal mesh ([place]) 03/06/2018  . Outlet dysfunction constipation 03/06/2018  . Status post arthroscopy of left knee 08/06/2017  . Unilateral primary osteoarthritis, left knee 08/06/2017  . Tremor 07/10/2017  . Other meniscus  derangements, posterior horn of medial meniscus, left knee 06/28/2017  . Ureteral stone 05/23/2017  . History of sepsis 05/23/2017  . Acute pain of left knee 04/09/2017  . Trochanteric bursitis, right hip 09/27/2016  . GAD (generalized anxiety disorder) 05/31/2014  . Rectocele 08/21/2011  . Uterovaginal prolapse 08/21/2011  . Dyspnea 10/13/2010  . Hypertension 10/13/2010    Issac Moure, PTA 07/28/2019, 4:10 PM   [facility] [address] [place], [place], [address] Phone: [phone]   Fax:  [phone]  Name: [name] MRN: [id number] Date of Birth: 02/17/1966

## 2019-07-28 NOTE — Patient Instructions (Signed)
[facility] Welcome to [place]! Here you will find all the information you will need regarding your pool therapy. If you have further questions at any time, please call our office at [phone]. After completing your initial evaluation in the [place] clinic, you may be eligible to complete a portion of your therapy in the pool. A typical week of therapy will consist of 1-2 typical physical therapy visits at our [place] location and an additional session of therapy in the pool located at the [facility] [place], [place], [place] [address].  Aquatic therapy will be offered on Friday afternoons. Each session will last approximately 30 minutes. All scheduling and payments for aquatic therapy sessions, including cancelations, will be done through our [place] location.  To be eligible for aquatic therapy, these criteria must be met: . You must be able to independently change in the locker room and get to the pool deck. A caregiver can come with you to help if needed. There are bleachers for a caregiver to sit on next to the pool. . No one with an open wound is permitted in the pool.  Handicap parking is available in the front and there is a drop off option for even closer accessibility. Please arrive 15 minutes prior to your appointment to prepare for your pool session. You must sign in at the front desk upon your arrival. Please be sure to attend to any toileting needs prior to entering the pool. [place] rooms for changing are located to the right of the check-in desk. There is direct access to the pool deck from the locker room. You can lock your belongings in a locker but must bring a lock. Your therapist will greet you on the pool deck. There may be other swimmers in the pool at the same time but your session is one-on-one with the therapist.   Aquatic Therapy: What to Expect!  Where:  [facility] [address] [place], [place]  [address] [phone]  NOTE:  Dennis Bast will receive an automated phone message reminding you of your appointment and it will say the appointment is at the [facility] on [address]  We are working to fix this- just know that you will meet [place] at the pool!  How to Prepare: . Please make sure you drink 8 ounces of water about one hour prior to your pool session . A caregiver MUST attend the entire session with the patient.  The caregiver will be responsible for assisting with dressing as well as any toileting needs.  If the patient will be doing a home program this should likely be the person who will assist as well.  . Patients must wear either their street shoes or pool shoes until they are ready to enter the pool with the therapist.  Patients must also wear either street shoes or pool shoes once exiting the pool to walk to the locker room.  This will helps [place] prevent slips and falls.  . Please arrive 15 minutes early to prepare for your pool therapy session . Sign in at the front desk on the clipboard marked for [place] . You may use the locker rooms on your right and then enter directly into the recreation pool (NOT the competition pool) . Please make sure to attend to any toileting needs prior to entering the pool . Please be dressed in your swim suit and on the pool deck at  least 5 minutes before your appointment . Once on the pool deck your therapist will ask you to sign the Patient  Consent and Assignment of Benefits form . Your therapist may take your blood pressure prior to, during and after your session if indicated  About the pool  and parking: 1. Entering the pool Your therapist will assist you; there are multiple ways to enter including stairs with railings, a walk in ramp, a roll in chair and a mechanical lift. Your therapist will determine the most appropriate way for you. 2. Water temperature is usually between 86-87 degrees 3. There may  be other swimmers in the pool at the same time 4. Parking is free: if you arrive and there is a parking attendant please inform them you are there for therapy and do not pay to park. Handicapped parking is located at the front entrance.  Contact Info:     Appointments: [facility]  All sessions are 45 minutes   [address]     Please call the [facility] if   [place], [place]   [address]    you need to cancel or reschedule an appointment.  [phone]

## 2019-07-30 ENCOUNTER — Ambulatory Visit: Payer: No Typology Code available for payment source | Admitting: Physical Therapy

## 2019-07-30 ENCOUNTER — Other Ambulatory Visit: Payer: Self-pay

## 2019-07-30 ENCOUNTER — Encounter: Payer: Self-pay | Admitting: Physical Therapy

## 2019-07-30 DIAGNOSIS — M6281 Muscle weakness (generalized): Secondary | ICD-10-CM

## 2019-07-30 DIAGNOSIS — R2681 Unsteadiness on feet: Secondary | ICD-10-CM | POA: Diagnosis not present

## 2019-07-30 DIAGNOSIS — R2689 Other abnormalities of gait and mobility: Secondary | ICD-10-CM

## 2019-07-30 DIAGNOSIS — M25662 Stiffness of left knee, not elsewhere classified: Secondary | ICD-10-CM

## 2019-07-30 DIAGNOSIS — M25562 Pain in left knee: Secondary | ICD-10-CM

## 2019-07-30 DIAGNOSIS — R600 Localized edema: Secondary | ICD-10-CM

## 2019-07-30 DIAGNOSIS — R293 Abnormal posture: Secondary | ICD-10-CM

## 2019-07-30 DIAGNOSIS — G8929 Other chronic pain: Secondary | ICD-10-CM

## 2019-07-30 NOTE — Therapy (Signed)
[facility] [address] [place], [place], [address] Phone: [phone]   Fax:  [phone]  Physical Therapy Treatment  Patient Details  Name: Sharon [name] MRN: [id number] Date of Birth: 06-22-1965 Referring Provider (PT): Jean Rosenthal, MD   Encounter Date: 07/30/2019  PT End of Session - 07/30/19 1453    Visit Number  23    Date for PT Re-Evaluation  09/04/19    Authorization Type  [employer] - 30 visits beginning 2021 - track visits below    Authorization Time Period  MET deductible,  100% covered for 2020    PT Start Time  1445    PT Stop Time  1555    PT Time Calculation (min)  70 min    Activity Tolerance  Patient tolerated treatment well    Behavior During Therapy  [facility] for tasks assessed/performed       Past Medical History:  Diagnosis Date  . Allergy   . Anal fissure   . Anemia    borderline  . Anxiety   . Arthritis   . Blood clot in vein    left leg  . Detached retina    BOTH SCLERA BUCKLE BOTH EYES  . DVT (deep venous thrombosis) (HCC)    left leg  . Endometriosis   . Family history of adverse reaction to anesthesia    DAUGHTER POST OP PONV  . GERD (gastroesophageal reflux disease)   . Glaucoma    RIGHT WORST THAN LEFT  . Heart murmur    "caused by anxiety"   . History of hemorrhoids   . History of kidney stones   . Hypertension   . IBS (irritable bowel syndrome)   . Perforated eardrum, right    SMALL HOLE  . PONV (postoperative nausea and vomiting)   . Pulmonary embolism ([place]) 6 YRS AGO    Past Surgical History:  Procedure Laterality Date  . ABDOMINAL HYSTERECTOMY    . CATARACT EXTRACTION Bilateral 2015  . COLONOSCOPY    . CYSTOSCOPY W/ URETERAL STENT PLACEMENT Right 05/23/2017   Procedure: CYSTOSCOPY WITH RETROGRADE PYELOGRAM/URETERAL RIGHT STENT PLACEMENT;  Surgeon: Bjorn Loser, MD;  Location[facility];  Service: Urology;  Laterality: Right;  . CYSTOSCOPY W/  URETERAL STENT PLACEMENT Right 06/04/2017   Procedure: CYSTOSCOPY WITHRIGHT RETROGRADE PYELOGRAMRIGHT /URETEREOSCOPY  STENT PLACEMENT;  Surgeon: Lucas Mallow, MD;  Location: [facility];  Service: Urology;  Laterality: Right;  . ENDOMETRIAL ABLATION    . EYE SURGERY Bilateral    cataract surgery with lens implants  . [place]  2011  . INCONTINENCE SURGERY    . INNER EAR SURGERY     X 2  . KNEE SURGERY    . RECTAL PROLAPSE REPAIR    . RETINAL DETACHMENT SURGERY Bilateral 2000  . RETINAL DETACHMENT SURGERY Bilateral   . TOTAL KNEE ARTHROPLASTY Left 04/29/2019  . TOTAL KNEE ARTHROPLASTY Left 04/29/2019   Procedure: LEFT TOTAL KNEE ARTHROPLASTY;  Surgeon: Mcarthur Rossetti, MD;  Location: [place];  Service: Orthopedics;  Laterality: Left;  . TUBAL LIGATION    . VEIN SURGERY Left 04/2010    There were no vitals filed for this visit.  Subjective Assessment - 07/30/19 1454    Subjective  i was in a ton of pain aftrer MOnday.    Pertinent History  right shoulder impingement, cervicalgia, right Trochanteric bursitis, dyspnea, HTN,    Currently in Pain?  Yes    Pain Score  7     Pain Location  Knee    Pain Orientation  Left    Pain Descriptors / Indicators  Sore    Multiple Pain Sites  No         [facility] PT Assessment - 07/30/19 0001      PROM   Left Knee Flexion  110   passive                  [facility] Adult PT Treatment/Exercise - 07/30/19 0001      Knee/Hip Exercises: Aerobic   Nustep  L4 seat 9 x 10 min   seat to 8 last 3 min     Knee/Hip Exercises: Machines for Strengthening   Cybex Leg Press  Seat 7; Bil 45# 2x15, LTLE 20# with emphasis on more extension 2x15   TC to quads facilitation, then to lumbar to inhibit extensio     Knee/Hip Exercises: Standing   Functional Squat  --   on mini tramp 2x10 VC to keep heels down   Rebounder  weight shifting 1 min 3 ways, VC to keep knees very straight       Vasopneumatic   Number  Minutes Vasopneumatic   15 minutes    Vasopnuematic Location   Knee    Vasopneumatic Pressure  Medium    Vasopneumatic Temperature   3 flakes      Manual Therapy   Joint Mobilization  fibulr heads mobs in sitting with LE off the table    Soft tissue mobilization  STM at distal quad    Passive ROM  passive knee flex in prone   and in sitting                 PT Long Term Goals - 07/23/19 1459      PT LONG TERM GOAL #1   Title  PROM Left knee flexion 120 degrees    Status  On-going      PT LONG TERM GOAL #2   Title  PROM Left knee extension -10    Status  On-going      PT LONG TERM GOAL #3   Title  Single leg stance LLE >/= 5 sec    Baseline  13 sec    Status  Achieved      PT LONG TERM GOAL #5   Title  Patient ambulates >400' without assistive device modified independent.    Baseline  able to walk without AD    Status  On-going      PT LONG TERM GOAL #6   Title  Patient negotiates stairs with single rail, ramps & curbs without assistive device modified independent.    Status  Achieved      PT LONG TERM GOAL #7   Title  Patient reports left knee pain </= 6/10 without prescription pain medications (using only over the counter meds)    Status  On-going            Plan - 07/30/19 1453    Clinical Impression Statement  Pt needs tacticle cuing for her quad activation, still very poor quads. PROM post session 110 degrees, painful. Pt does demonstrate improved standing knee extension  but does require verbal cuing.    Personal Factors and Comorbidities  Comorbidity 3+;Fitness;Past/Current Experience    Comorbidities  right shoulder impingement, cervicalgia, right Trochanteric bursitis, dyspnea, HTN    Examination-Activity Limitations  Bend;Lift;Locomotion Level;Stand;Stairs;Transfers    Examination-Participation Restrictions  Community Activity    Stability/Clinical Decision Making  Stable/Uncomplicated    Rehab Potential  Good    PT Frequency  3x / week     PT Duration  6 weeks    PT Treatment/Interventions  ADLs/Self Care Home Management;Electrical Stimulation;Moist Heat;DME Instruction;Gait training;Stair training;Functional mobility training;Therapeutic activities;Therapeutic exercise;Balance training;Neuromuscular re-education;Patient/family education;Manual techniques;Scar mobilization;Compression bandaging;Passive range of motion;Dry needling;Taping;Vasopneumatic Device;Joint Manipulations    PT Next Visit Plan  quad strength, fib head mobs, stretching for knee flexion    PT Home Exercise Plan  [facility]    Consulted and Agree with Plan of Care  Patient       Patient will benefit from skilled therapeutic intervention in order to improve the following deficits and impairments:  Abnormal gait, Decreased activity tolerance, Decreased balance, Decreased endurance, Decreased mobility, Decreased range of motion, Decreased skin integrity, Decreased scar mobility, Decreased strength, Increased edema, Impaired flexibility, Postural dysfunction, Pain  Visit Diagnosis: Unsteadiness on feet  Other abnormalities of gait and mobility  Abnormal posture  Localized edema  Chronic pain of left knee  Muscle weakness  Stiffness of left knee, not elsewhere classified     Problem List Patient Active Problem List   Diagnosis Date Noted  . Status post total left knee replacement 04/29/2019  . Impingement syndrome of right shoulder 10/28/2018  . Chronic pain of left knee 03/27/2018  . Chronic right shoulder pain 03/27/2018  . Cervicalgia 03/27/2018  . Exposure of implanted vaginal mesh ([place]) 03/06/2018  . Outlet dysfunction constipation 03/06/2018  . Status post arthroscopy of left knee 08/06/2017  . Unilateral primary osteoarthritis, left knee 08/06/2017  . Tremor 07/10/2017  . Other meniscus derangements, posterior horn of medial meniscus, left knee 06/28/2017  . Ureteral stone 05/23/2017  . History of sepsis 05/23/2017  . Acute pain of left  knee 04/09/2017  . Trochanteric bursitis, right hip 09/27/2016  . GAD (generalized anxiety disorder) 05/31/2014  . Rectocele 08/21/2011  . Uterovaginal prolapse 08/21/2011  . Dyspnea 10/13/2010  . Hypertension 10/13/2010    Sharon [name], PTA 07/30/2019, 5:09 PM  [facility] [address] [place], [place], [address] Phone: [phone]   Fax:  [phone]  Name: Sharon [name] MRN: [id number] Date of Birth: 06-Feb-1966

## 2019-07-31 ENCOUNTER — Telehealth: Payer: Self-pay | Admitting: Orthopaedic Surgery

## 2019-07-31 NOTE — Telephone Encounter (Signed)
Kim from [facility] called.   She needs to know where the patient was sent to start therapy.    Case number [id number] Phone: [phone]

## 2019-07-31 NOTE — Telephone Encounter (Signed)
Physical therapy here with Robin. Visit was 05/29/19 from referral notes.

## 2019-07-31 NOTE — Telephone Encounter (Signed)
Can you tell what PT she's going to?

## 2019-08-01 NOTE — Telephone Encounter (Signed)
Information given

## 2019-08-04 ENCOUNTER — Ambulatory Visit: Payer: No Typology Code available for payment source | Admitting: Physical Therapy

## 2019-08-05 ENCOUNTER — Other Ambulatory Visit: Payer: Self-pay | Admitting: Orthopaedic Surgery

## 2019-08-05 ENCOUNTER — Telehealth: Payer: Self-pay | Admitting: Orthopaedic Surgery

## 2019-08-05 MED ORDER — OXYCODONE HCL 5 MG PO TABS: 5.0000 mg | ORAL_TABLET | Freq: Four times a day (QID) | ORAL | 0 refills | Status: DC | PRN

## 2019-08-05 NOTE — Telephone Encounter (Signed)
Patient called.   She currently has an appointment scheduled for Thursday but is worried she will run out of her medicine during the inclement weather. She is requesting her refill be given early.   Call back number: [phone]

## 2019-08-06 ENCOUNTER — Other Ambulatory Visit: Payer: Self-pay

## 2019-08-06 ENCOUNTER — Ambulatory Visit: Payer: No Typology Code available for payment source | Admitting: Physical Therapy

## 2019-08-06 ENCOUNTER — Encounter: Payer: Self-pay | Admitting: Physical Therapy

## 2019-08-06 DIAGNOSIS — R2681 Unsteadiness on feet: Secondary | ICD-10-CM | POA: Diagnosis not present

## 2019-08-06 DIAGNOSIS — R2689 Other abnormalities of gait and mobility: Secondary | ICD-10-CM

## 2019-08-06 DIAGNOSIS — R600 Localized edema: Secondary | ICD-10-CM

## 2019-08-06 DIAGNOSIS — R293 Abnormal posture: Secondary | ICD-10-CM

## 2019-08-06 NOTE — Therapy (Signed)
[facility] [address], [place], [place], [address] Phone: [phone]   Fax:  [phone]  Physical Therapy Treatment  Patient Details  Name: Sharon [name] MRN: [id number] Date of Birth: 08/25/1965 Referring Provider (PT): Jean Rosenthal, MD   Encounter Date: 08/06/2019  PT End of Session - 08/06/19 1459    Visit Number  24    Date for PT Re-Evaluation  09/04/19    Authorization Type  [employer] - 30 visits beginning 2021 - track visits below    Authorization Time Period  MET deductible,  100% covered for 2020    Authorization - Visit Number  12    Authorization - Number of Visits  30    PT Start Time  0488    PT Stop Time  1600    PT Time Calculation (min)  69 min    Activity Tolerance  Patient tolerated treatment well    Behavior During Therapy  [facility] for tasks assessed/performed       Past Medical History:  Diagnosis Date  . Allergy   . Anal fissure   . Anemia    borderline  . Anxiety   . Arthritis   . Blood clot in vein    left leg  . Detached retina    BOTH SCLERA BUCKLE BOTH EYES  . DVT (deep venous thrombosis) (HCC)    left leg  . Endometriosis   . Family history of adverse reaction to anesthesia    DAUGHTER POST OP PONV  . GERD (gastroesophageal reflux disease)   . Glaucoma    RIGHT WORST THAN LEFT  . Heart murmur    "caused by anxiety"   . History of hemorrhoids   . History of kidney stones   . Hypertension   . IBS (irritable bowel syndrome)   . Perforated eardrum, right    SMALL HOLE  . PONV (postoperative nausea and vomiting)   . Pulmonary embolism ([place]) 6 YRS AGO    Past Surgical History:  Procedure Laterality Date  . ABDOMINAL HYSTERECTOMY    . CATARACT EXTRACTION Bilateral 2015  . COLONOSCOPY    . CYSTOSCOPY W/ URETERAL STENT PLACEMENT Right 05/23/2017   Procedure: CYSTOSCOPY WITH RETROGRADE PYELOGRAM/URETERAL RIGHT STENT PLACEMENT;  Surgeon: Bjorn Loser, MD;   [facility];  Service: Urology;  Laterality: Right;  . CYSTOSCOPY W/ URETERAL STENT PLACEMENT Right 06/04/2017   Procedure: CYSTOSCOPY WITHRIGHT RETROGRADE PYELOGRAMRIGHT /URETEREOSCOPY  STENT PLACEMENT;  Surgeon: Lucas Mallow, MD;  Location: [facility];  Service: Urology;  Laterality: Right;  . ENDOMETRIAL ABLATION    . EYE SURGERY Bilateral    cataract surgery with lens implants  . [place]  2011  . INCONTINENCE SURGERY    . INNER EAR SURGERY     X 2  . KNEE SURGERY    . RECTAL PROLAPSE REPAIR    . RETINAL DETACHMENT SURGERY Bilateral 2000  . RETINAL DETACHMENT SURGERY Bilateral   . TOTAL KNEE ARTHROPLASTY Left 04/29/2019  . TOTAL KNEE ARTHROPLASTY Left 04/29/2019   Procedure: LEFT TOTAL KNEE ARTHROPLASTY;  Surgeon: Mcarthur Rossetti, MD;  Location: [place];  Service: Orthopedics;  Laterality: Left;  . TUBAL LIGATION    . VEIN SURGERY Left 04/2010    There were no vitals filed for this visit.  Subjective Assessment - 08/06/19 1502    Subjective  I can stand in the kitchen longer now to cook.    Pertinent History  right shoulder impingement,  cervicalgia, right Trochanteric bursitis, dyspnea, HTN,    Currently in Pain?  Yes   Rt knee is sore. Lateral LT knee 6/10   Multiple Pain Sites  No         [facility] PT Assessment - 08/06/19 0001      PROM   Left Knee Flexion  110   passive in supine                  [facility] Adult PT Treatment/Exercise - 08/06/19 0001      Knee/Hip Exercises: Aerobic   Nustep  L4 seat 9 x 10 min   seat to 8 last 3 min     Knee/Hip Exercises: Machines for Strengthening   Cybex Leg Press  Seat 7 then moved to 6: Bil 45# 15x, LTLE 25# 15x    Vc to DF for more quad extension     Knee/Hip Exercises: Standing   Rebounder  weight shifting 1 min 3 ways, VC to keep knees very straight    VC for > knee ext in standing     Vasopneumatic   Number Minutes Vasopneumatic   15 minutes    Vasopnuematic  Location   Knee    Vasopneumatic Pressure  Medium    Vasopneumatic Temperature   3 flakes      Manual Therapy   Soft tissue mobilization  STM at distal quad    Passive ROM  passive knee flex in prone   and in sitting                 PT Long Term Goals - 07/23/19 1459      PT LONG TERM GOAL #1   Title  PROM Left knee flexion 120 degrees    Status  On-going      PT LONG TERM GOAL #2   Title  PROM Left knee extension -10    Status  On-going      PT LONG TERM GOAL #3   Title  Single leg stance LLE >/= 5 sec    Baseline  13 sec    Status  Achieved      PT LONG TERM GOAL #5   Title  Patient ambulates >400' without assistive device modified independent.    Baseline  able to walk without AD    Status  On-going      PT LONG TERM GOAL #6   Title  Patient negotiates stairs with single rail, ramps & curbs without assistive device modified independent.    Status  Achieved      PT LONG TERM GOAL #7   Title  Patient reports left knee pain </= 6/10 without prescription pain medications (using only over the counter meds)    Status  On-going            Plan - 08/06/19 1459    Clinical Impression Statement  Pt reports she can now help cook in the kitchen, get and out of the car easier, and to get dressed in the morning. " I do feel the weakness" in my legs. pt achieved 110 degrees of passive knee flexion at end of todays session.    Personal Factors and Comorbidities  Comorbidity 3+;Fitness;Past/Current Experience    Comorbidities  right shoulder impingement, cervicalgia, right Trochanteric bursitis, dyspnea, HTN    Examination-Activity Limitations  Bend;Lift;Locomotion Level;Stand;Stairs;Transfers    Examination-Participation Restrictions  Community Activity    Stability/Clinical Decision Making  Stable/Uncomplicated    Rehab Potential  Good    PT  Frequency  3x / week    PT Duration  6 weeks    PT Treatment/Interventions  ADLs/Self Care Home Management;Electrical  Stimulation;Moist Heat;DME Instruction;Gait training;Stair training;Functional mobility training;Therapeutic activities;Therapeutic exercise;Balance training;Neuromuscular re-education;Patient/family education;Manual techniques;Scar mobilization;Compression bandaging;Passive range of motion;Dry needling;Taping;Vasopneumatic Device;Joint Manipulations    PT Next Visit Plan  quad strength, fib head mobs, stretching for knee flexion    PT Home Exercise Plan  [facility]    Consulted and Agree with Plan of Care  Patient       Patient will benefit from skilled therapeutic intervention in order to improve the following deficits and impairments:  Abnormal gait, Decreased activity tolerance, Decreased balance, Decreased endurance, Decreased mobility, Decreased range of motion, Decreased skin integrity, Decreased scar mobility, Decreased strength, Increased edema, Impaired flexibility, Postural dysfunction, Pain  Visit Diagnosis: Unsteadiness on feet  Other abnormalities of gait and mobility  Abnormal posture  Localized edema     Problem List Patient Active Problem List   Diagnosis Date Noted  . Status post total left knee replacement 04/29/2019  . Impingement syndrome of right shoulder 10/28/2018  . Chronic pain of left knee 03/27/2018  . Chronic right shoulder pain 03/27/2018  . Cervicalgia 03/27/2018  . Exposure of implanted vaginal mesh ([place]) 03/06/2018  . Outlet dysfunction constipation 03/06/2018  . Status post arthroscopy of left knee 08/06/2017  . Unilateral primary osteoarthritis, left knee 08/06/2017  . Tremor 07/10/2017  . Other meniscus derangements, posterior horn of medial meniscus, left knee 06/28/2017  . Ureteral stone 05/23/2017  . History of sepsis 05/23/2017  . Acute pain of left knee 04/09/2017  . Trochanteric bursitis, right hip 09/27/2016  . GAD (generalized anxiety disorder) 05/31/2014  . Rectocele 08/21/2011  . Uterovaginal prolapse 08/21/2011  . Dyspnea  10/13/2010  . Hypertension 10/13/2010    Ivie Maese, PTA 08/06/2019, 4:14 PM  [facility] [address] [place], [place], [address] Phone: [phone]   Fax:  [phone]  Name: Sharon [name] MRN: [id number] Date of Birth: Oct 19, 1965

## 2019-08-07 ENCOUNTER — Ambulatory Visit: Payer: No Typology Code available for payment source | Admitting: Orthopaedic Surgery

## 2019-08-11 ENCOUNTER — Other Ambulatory Visit: Payer: Self-pay

## 2019-08-11 ENCOUNTER — Encounter: Payer: Self-pay | Admitting: Physical Therapy

## 2019-08-11 ENCOUNTER — Ambulatory Visit: Payer: No Typology Code available for payment source | Admitting: Physical Therapy

## 2019-08-11 DIAGNOSIS — M25662 Stiffness of left knee, not elsewhere classified: Secondary | ICD-10-CM

## 2019-08-11 DIAGNOSIS — R293 Abnormal posture: Secondary | ICD-10-CM

## 2019-08-11 DIAGNOSIS — M6281 Muscle weakness (generalized): Secondary | ICD-10-CM

## 2019-08-11 DIAGNOSIS — R2681 Unsteadiness on feet: Secondary | ICD-10-CM

## 2019-08-11 DIAGNOSIS — R2689 Other abnormalities of gait and mobility: Secondary | ICD-10-CM

## 2019-08-11 DIAGNOSIS — M25562 Pain in left knee: Secondary | ICD-10-CM

## 2019-08-11 DIAGNOSIS — G8929 Other chronic pain: Secondary | ICD-10-CM

## 2019-08-11 DIAGNOSIS — R600 Localized edema: Secondary | ICD-10-CM

## 2019-08-11 NOTE — Therapy (Signed)
[facility] [address] [place], [place], [address] Phone: [phone]   Fax:  [phone]  Physical Therapy Treatment  Patient Details  Name: Sharon [name] MRN: [id number] Date of Birth: 1965/11/03 Referring Provider (PT): Jean Rosenthal, MD   Encounter Date: 08/11/2019  PT End of Session - 08/11/19 1403    Visit Number  25    Number of Visits  19    Date for PT Re-Evaluation  09/04/19    Authorization Type  [employer] - 30 visits beginning 2021 - track visits below    Authorization Time Period  MET deductible,  100% covered for 2020    PT Start Time  1402    PT Stop Time  1500    PT Time Calculation (min)  58 min    Activity Tolerance  Patient tolerated treatment well    Behavior During Therapy  [facility] for tasks assessed/performed       Past Medical History:  Diagnosis Date  . Allergy   . Anal fissure   . Anemia    borderline  . Anxiety   . Arthritis   . Blood clot in vein    left leg  . Detached retina    BOTH SCLERA BUCKLE BOTH EYES  . DVT (deep venous thrombosis) (HCC)    left leg  . Endometriosis   . Family history of adverse reaction to anesthesia    DAUGHTER POST OP PONV  . GERD (gastroesophageal reflux disease)   . Glaucoma    RIGHT WORST THAN LEFT  . Heart murmur    "caused by anxiety"   . History of hemorrhoids   . History of kidney stones   . Hypertension   . IBS (irritable bowel syndrome)   . Perforated eardrum, right    SMALL HOLE  . PONV (postoperative nausea and vomiting)   . Pulmonary embolism ([place]) 6 YRS AGO    Past Surgical History:  Procedure Laterality Date  . ABDOMINAL HYSTERECTOMY    . CATARACT EXTRACTION Bilateral 2015  . COLONOSCOPY    . CYSTOSCOPY W/ URETERAL STENT PLACEMENT Right 05/23/2017   Procedure: CYSTOSCOPY WITH RETROGRADE PYELOGRAM/URETERAL RIGHT STENT PLACEMENT;  Surgeon: Bjorn Loser, MD;  Location: [facility];  Service: Urology;  Laterality:  Right;  . CYSTOSCOPY W/ URETERAL STENT PLACEMENT Right 06/04/2017   Procedure: CYSTOSCOPY WITHRIGHT RETROGRADE PYELOGRAMRIGHT /URETEREOSCOPY  STENT PLACEMENT;  Surgeon: Lucas Mallow, MD;  Location: [facility];  Service: Urology;  Laterality: Right;  . ENDOMETRIAL ABLATION    . EYE SURGERY Bilateral    cataract surgery with lens implants  . [place]  2011  . INCONTINENCE SURGERY    . INNER EAR SURGERY     X 2  . KNEE SURGERY    . RECTAL PROLAPSE REPAIR    . RETINAL DETACHMENT SURGERY Bilateral 2000  . RETINAL DETACHMENT SURGERY Bilateral   . TOTAL KNEE ARTHROPLASTY Left 04/29/2019  . TOTAL KNEE ARTHROPLASTY Left 04/29/2019   Procedure: LEFT TOTAL KNEE ARTHROPLASTY;  Surgeon: Mcarthur Rossetti, MD;  Location: [place];  Service: Orthopedics;  Laterality: Left;  . TUBAL LIGATION    . VEIN SURGERY Left 04/2010    There were no vitals filed for this visit.  Subjective Assessment - 08/11/19 1406    Subjective  I made a leg press at home with my green band.    Pertinent History  right shoulder impingement, cervicalgia, right Trochanteric bursitis, dyspnea, HTN,    Currently  in Pain?  Yes    Pain Score  --   with medication   Pain Location  Knee    Pain Orientation  Left    Pain Descriptors / Indicators  Sore    Aggravating Factors   bending the knee al lot    Pain Relieving Factors  meds, ice    Multiple Pain Sites  No         [facility] PT Assessment - 08/11/19 0001      PROM   Left Knee Flexion  --   110 initially but fairly easy, then after bike 115 Pass                  [facility] Adult PT Treatment/Exercise - 08/11/19 0001      Knee/Hip Exercises: Aerobic   Nustep  L4 seat 9 x 10 min   seat to 8 last 3 min     Knee/Hip Exercises: Machines for Strengthening   Cybex Leg Press  Seat 7 Bil 50# 15x, LTLE 25# 2x15      Knee/Hip Exercises: Standing   Rebounder  weight shifting 1 min 3 ways, VC to keep knees very straight    VC for >  knee ext in standing     Vasopneumatic   Number Minutes Vasopneumatic   5 minutes   pt had to leave for another appt   Vasopnuematic Location   Knee    Vasopneumatic Pressure  Medium    Vasopneumatic Temperature   3 flakes      Manual Therapy   Soft tissue mobilization  STM at distal quad    Passive ROM  passive knee flex in prone, CR in prone   and in sitting                 PT Long Term Goals - 07/23/19 1459      PT LONG TERM GOAL #1   Title  PROM Left knee flexion 120 degrees    Status  On-going      PT LONG TERM GOAL #2   Title  PROM Left knee extension -10    Status  On-going      PT LONG TERM GOAL #3   Title  Single leg stance LLE >/= 5 sec    Baseline  13 sec    Status  Achieved      PT LONG TERM GOAL #5   Title  Patient ambulates >400' without assistive device modified independent.    Baseline  able to walk without AD    Status  On-going      PT LONG TERM GOAL #6   Title  Patient negotiates stairs with single rail, ramps & curbs without assistive device modified independent.    Status  Achieved      PT LONG TERM GOAL #7   Title  Patient reports left knee pain </= 6/10 without prescription pain medications (using only over the counter meds)    Status  On-going            Plan - 08/11/19 1404    Clinical Impression Statement  Pt arrives with complaints of knee stiffness and soreness. She sees the MD this Thursday. Pt does demonstrate improved quad facilition although it remains very weak. Post session PROM was initially 110 degrees, had pt spin bike for 5 min before measuring again where we achieved 115 with some effort. Pt does demonstrate improved heel toe gait ( doe not need as constant reminding) and additionally  standing with straighter knee due to some quad strength.    Personal Factors and Comorbidities  Comorbidity 3+;Fitness;Past/Current Experience    Comorbidities  right shoulder impingement, cervicalgia, right Trochanteric bursitis,  dyspnea, HTN    Examination-Activity Limitations  Bend;Lift;Locomotion Level;Stand;Stairs;Transfers    Examination-Participation Restrictions  Community Activity    Stability/Clinical Decision Making  Stable/Uncomplicated    Rehab Potential  Good    PT Frequency  3x / week    PT Duration  6 weeks    PT Treatment/Interventions  ADLs/Self Care Home Management;Electrical Stimulation;Moist Heat;DME Instruction;Gait training;Stair training;Functional mobility training;Therapeutic activities;Therapeutic exercise;Balance training;Neuromuscular re-education;Patient/family education;Manual techniques;Scar mobilization;Compression bandaging;Passive range of motion;Dry needling;Taping;Vasopneumatic Device;Joint Manipulations    PT Next Visit Plan  Pt on waiting list Wedensday and sees the MD Thursday.    PT Home Exercise Plan  [facility]    Consulted and Agree with Plan of Care  Patient       Patient will benefit from skilled therapeutic intervention in order to improve the following deficits and impairments:  Abnormal gait, Decreased activity tolerance, Decreased balance, Decreased endurance, Decreased mobility, Decreased range of motion, Decreased skin integrity, Decreased scar mobility, Decreased strength, Increased edema, Impaired flexibility, Postural dysfunction, Pain  Visit Diagnosis: Unsteadiness on feet  Other abnormalities of gait and mobility  Abnormal posture  Localized edema  Chronic pain of left knee  Muscle weakness  Stiffness of left knee, not elsewhere classified     Problem List Patient Active Problem List   Diagnosis Date Noted  . Status post total left knee replacement 04/29/2019  . Impingement syndrome of right shoulder 10/28/2018  . Chronic pain of left knee 03/27/2018  . Chronic right shoulder pain 03/27/2018  . Cervicalgia 03/27/2018  . Exposure of implanted vaginal mesh ([place]) 03/06/2018  . Outlet dysfunction constipation 03/06/2018  . Status post arthroscopy of  left knee 08/06/2017  . Unilateral primary osteoarthritis, left knee 08/06/2017  . Tremor 07/10/2017  . Other meniscus derangements, posterior horn of medial meniscus, left knee 06/28/2017  . Ureteral stone 05/23/2017  . History of sepsis 05/23/2017  . Acute pain of left knee 04/09/2017  . Trochanteric bursitis, right hip 09/27/2016  . GAD (generalized anxiety disorder) 05/31/2014  . Rectocele 08/21/2011  . Uterovaginal prolapse 08/21/2011  . Dyspnea 10/13/2010  . Hypertension 10/13/2010    Cheryll Keisler, PTA 08/11/2019, 3:46 PM  [facility] [address], [place], [place], [address] Phone: [phone]   Fax:  [phone]  Name: Lauryl Seyer MRN: [id number] Date of Birth: [birth year]

## 2019-08-14 ENCOUNTER — Encounter: Payer: Self-pay | Admitting: Orthopaedic Surgery

## 2019-08-14 ENCOUNTER — Ambulatory Visit (INDEPENDENT_AMBULATORY_CARE_PROVIDER_SITE_OTHER): Payer: No Typology Code available for payment source | Admitting: Orthopaedic Surgery

## 2019-08-14 ENCOUNTER — Other Ambulatory Visit: Payer: Self-pay

## 2019-08-14 DIAGNOSIS — Z96652 Presence of left artificial knee joint: Secondary | ICD-10-CM

## 2019-08-14 MED ORDER — HYDROCODONE-ACETAMINOPHEN 7.5-325 MG PO TABS: 1.0000 | ORAL_TABLET | Freq: Four times a day (QID) | ORAL | 0 refills | Status: DC | PRN

## 2019-08-14 NOTE — Progress Notes (Signed)
The patient is now 3 and half months status post a left total knee arthroplasty.  Her postoperative course has been complicated by pain.  She is still on oxycodone.  She is still pushing herself through therapy.  She states that they have been able to flex her to about 105 to 115 degrees.  She is ambulate with a cane.  She is only 54 years old.  On exam the knee is still swollen there is no evidence of infection.  Her extension is almost full and her flexion today in the office is about just past 100 degrees.  I would like her to continue physical therapy for another 4 to 6 weeks.  At this point we have to have her off oxycodone and we will transition her to hydrocodone.  We will see her back in 4 weeks to see how she is doing overall.  No x-rays are needed.

## 2019-08-18 ENCOUNTER — Encounter: Payer: Self-pay | Admitting: Physical Therapy

## 2019-08-18 ENCOUNTER — Ambulatory Visit: Payer: PRIVATE HEALTH INSURANCE | Attending: Orthopaedic Surgery | Admitting: Physical Therapy

## 2019-08-18 ENCOUNTER — Other Ambulatory Visit: Payer: Self-pay

## 2019-08-18 DIAGNOSIS — R293 Abnormal posture: Secondary | ICD-10-CM | POA: Diagnosis present

## 2019-08-18 DIAGNOSIS — M6281 Muscle weakness (generalized): Secondary | ICD-10-CM | POA: Diagnosis present

## 2019-08-18 DIAGNOSIS — G8929 Other chronic pain: Secondary | ICD-10-CM | POA: Insufficient documentation

## 2019-08-18 DIAGNOSIS — M25662 Stiffness of left knee, not elsewhere classified: Secondary | ICD-10-CM | POA: Insufficient documentation

## 2019-08-18 DIAGNOSIS — R2681 Unsteadiness on feet: Secondary | ICD-10-CM

## 2019-08-18 DIAGNOSIS — R2689 Other abnormalities of gait and mobility: Secondary | ICD-10-CM | POA: Diagnosis present

## 2019-08-18 DIAGNOSIS — M25562 Pain in left knee: Secondary | ICD-10-CM | POA: Diagnosis present

## 2019-08-18 DIAGNOSIS — R600 Localized edema: Secondary | ICD-10-CM | POA: Insufficient documentation

## 2019-08-18 NOTE — Therapy (Signed)
[facility] [address] [place], [place], [address] Phone: [phone]   Fax:  [phone]  Physical Therapy Treatment  Patient Details  Name: Sharon [name] MRN: [id number] Date of Birth: Aug 22, 1965 Referring Provider (PT): Jean Rosenthal, MD   Encounter Date: 08/18/2019  PT End of Session - 08/18/19 1451    Visit Number  26    Number of Visits  30    Date for PT Re-Evaluation  09/04/19    Authorization Type  [employer] - 30 visits beginning 2021 - track visits below    Authorization Time Period  MET deductible,  100% covered for 2020    Authorization - Number of Visits  30    PT Start Time  1450    PT Stop Time  1555    PT Time Calculation (min)  65 min    Activity Tolerance  Patient tolerated treatment well    Behavior During Therapy  [facility] for tasks assessed/performed       Past Medical History:  Diagnosis Date  . Allergy   . Anal fissure   . Anemia    borderline  . Anxiety   . Arthritis   . Blood clot in vein    left leg  . Detached retina    BOTH SCLERA BUCKLE BOTH EYES  . DVT (deep venous thrombosis) (HCC)    left leg  . Endometriosis   . Family history of adverse reaction to anesthesia    DAUGHTER POST OP PONV  . GERD (gastroesophageal reflux disease)   . Glaucoma    RIGHT WORST THAN LEFT  . Heart murmur    "caused by anxiety"   . History of hemorrhoids   . History of kidney stones   . Hypertension   . IBS (irritable bowel syndrome)   . Perforated eardrum, right    SMALL HOLE  . PONV (postoperative nausea and vomiting)   . Pulmonary embolism ([place]) 6 YRS AGO    Past Surgical History:  Procedure Laterality Date  . ABDOMINAL HYSTERECTOMY    . CATARACT EXTRACTION Bilateral 2015  . COLONOSCOPY    . CYSTOSCOPY W/ URETERAL STENT PLACEMENT Right 05/23/2017   Procedure: CYSTOSCOPY WITH RETROGRADE PYELOGRAM/URETERAL RIGHT STENT PLACEMENT;  Surgeon: Bjorn Loser, MD;  [facility];  Service: Urology;  Laterality: Right;  . CYSTOSCOPY W/ URETERAL STENT PLACEMENT Right 06/04/2017   Procedure: CYSTOSCOPY WITHRIGHT RETROGRADE PYELOGRAMRIGHT /URETEREOSCOPY  STENT PLACEMENT;  Surgeon: Lucas Mallow, MD;  Location: [facility];  Service: Urology;  Laterality: Right;  . ENDOMETRIAL ABLATION    . EYE SURGERY Bilateral    cataract surgery with lens implants  . [place]  2011  . INCONTINENCE SURGERY    . INNER EAR SURGERY     X 2  . KNEE SURGERY    . RECTAL PROLAPSE REPAIR    . RETINAL DETACHMENT SURGERY Bilateral 2000  . RETINAL DETACHMENT SURGERY Bilateral   . TOTAL KNEE ARTHROPLASTY Left 04/29/2019  . TOTAL KNEE ARTHROPLASTY Left 04/29/2019   Procedure: LEFT TOTAL KNEE ARTHROPLASTY;  Surgeon: Mcarthur Rossetti, MD;  Location: [facility];  Service: Orthopedics;  Laterality: Left;  . TUBAL LIGATION    . VEIN SURGERY Left 04/2010    There were no vitals filed for this visit.  Subjective Assessment - 08/18/19 1453    Subjective  Saw MD, he ordered 4-6 weeks more PT but Pt's insurance will be running out in next 4 visits. Pt  thinks she may do self pay.    Pertinent History  right shoulder impingement, cervicalgia, right Trochanteric bursitis, dyspnea, HTN,         [facility] PT Assessment - 08/18/19 0001      PROM   Left Knee Flexion  107                   [facility] Adult PT Treatment/Exercise - 08/18/19 0001      Knee/Hip Exercises: Aerobic   Nustep  L4 seat 7 x 10 min      Knee/Hip Exercises: Machines for Strengthening   Cybex Leg Press  Seat 7: Bil 60# 2x15,LTLE 30# 2x15       Knee/Hip Exercises: Standing   Rebounder  weight shifting 1 min 3 ways, VC to keep knees very straight    Heel raises 20x, mini squats 20x   Other Standing Knee Exercises  VC to keep heel down on mini tramp      Vasopneumatic   Number Minutes Vasopneumatic   10 minutes    Vasopnuematic Location   Knee    Vasopneumatic Pressure  Medium     Vasopneumatic Temperature   3 flakes      Manual Therapy   Passive ROM  passive knee flex in prone, CR in prone   and in sitting            PT Education - 08/18/19 1620    Education Details  Seated LAQ using RTLE to assist for TKE ( pt not doing TKE at home with the band) added to HEP.    Person(s) Educated  Patient    Methods  Explanation;Demonstration;Tactile cues;Verbal cues    Comprehension  Returned demonstration;Verbalized understanding          PT Long Term Goals - 07/23/19 1459      PT LONG TERM GOAL #1   Title  PROM Left knee flexion 120 degrees    Status  On-going      PT LONG TERM GOAL #2   Title  PROM Left knee extension -10    Status  On-going      PT LONG TERM GOAL #3   Title  Single leg stance LLE >/= 5 sec    Baseline  13 sec    Status  Achieved      PT LONG TERM GOAL #5   Title  Patient ambulates >400' without assistive device modified independent.    Baseline  able to walk without AD    Status  On-going      PT LONG TERM GOAL #6   Title  Patient negotiates stairs with single rail, ramps & curbs without assistive device modified independent.    Status  Achieved      PT LONG TERM GOAL #7   Title  Patient reports left knee pain </= 6/10 without prescription pain medications (using only over the counter meds)    Status  On-going            Plan - 08/18/19 1617    Clinical Impression Statement  Pt saw MD, ordered more PT but pt only has 4 more visits left on her insurance. She is considering self pay 1x week once her visits expire. Pt was able to tolerate more resistance on the leg press, and PTA showed pt a few more exercises on the mini tramp as she just purchased one for home use. Knee PROM post session was 107 degrees.    Personal Factors and Comorbidities  Comorbidity 3+;Fitness;Past/Current Experience    Comorbidities  right shoulder impingement, cervicalgia, right Trochanteric bursitis, dyspnea, HTN    Examination-Activity  Limitations  Bend;Lift;Locomotion Level;Stand;Stairs;Transfers    Examination-Participation Restrictions  Community Activity    Stability/Clinical Decision Making  Stable/Uncomplicated    Rehab Potential  Good    PT Frequency  3x / week    PT Duration  6 weeks    PT Treatment/Interventions  ADLs/Self Care Home Management;Electrical Stimulation;Moist Heat;DME Instruction;Gait training;Stair training;Functional mobility training;Therapeutic activities;Therapeutic exercise;Balance training;Neuromuscular re-education;Patient/family education;Manual techniques;Scar mobilization;Compression bandaging;Passive range of motion;Dry needling;Taping;Vasopneumatic Device;Joint Manipulations    PT Next Visit Plan  Knee strength and ROM, see if pt doing the LAQ exercise at home    PT Home Exercise Plan  [facility]    Consulted and Agree with Plan of Care  Patient       Patient will benefit from skilled therapeutic intervention in order to improve the following deficits and impairments:  Abnormal gait, Decreased activity tolerance, Decreased balance, Decreased endurance, Decreased mobility, Decreased range of motion, Decreased skin integrity, Decreased scar mobility, Decreased strength, Increased edema, Impaired flexibility, Postural dysfunction, Pain  Visit Diagnosis: Unsteadiness on feet  Other abnormalities of gait and mobility  Abnormal posture  Localized edema  Chronic pain of left knee  Muscle weakness  Stiffness of left knee, not elsewhere classified     Problem List Patient Active Problem List   Diagnosis Date Noted  . Status post total left knee replacement 04/29/2019  . Impingement syndrome of right shoulder 10/28/2018  . Chronic pain of left knee 03/27/2018  . Chronic right shoulder pain 03/27/2018  . Cervicalgia 03/27/2018  . Exposure of implanted vaginal mesh ([place]) 03/06/2018  . Outlet dysfunction constipation 03/06/2018  . Status post arthroscopy of left knee 08/06/2017  .  Unilateral primary osteoarthritis, left knee 08/06/2017  . Tremor 07/10/2017  . Other meniscus derangements, posterior horn of medial meniscus, left knee 06/28/2017  . Ureteral stone 05/23/2017  . History of sepsis 05/23/2017  . Acute pain of left knee 04/09/2017  . Trochanteric bursitis, right hip 09/27/2016  . GAD (generalized anxiety disorder) 05/31/2014  . Rectocele 08/21/2011  . Uterovaginal prolapse 08/21/2011  . Dyspnea 10/13/2010  . Hypertension 10/13/2010    Dyshon Philbin, PTA 08/18/2019, 4:22 PM  [facility] [address] [place], [place], [address] Phone: [phone]   Fax:  [phone]  Name: Namira Rosekrans MRN: [id number] Date of Birth: Dec 28, 1965

## 2019-08-19 ENCOUNTER — Other Ambulatory Visit: Payer: Self-pay

## 2019-08-19 ENCOUNTER — Telehealth: Payer: Self-pay | Admitting: Orthopaedic Surgery

## 2019-08-19 DIAGNOSIS — Z96652 Presence of left artificial knee joint: Secondary | ICD-10-CM

## 2019-08-19 NOTE — Telephone Encounter (Signed)
I am okay with [place] giving order for her to continue her physical therapy for least another 4 to 6 weeks.  From a disability standpoint, I assume she is talking about short-term disability.  She can have [place] fill out any paperwork that we will have her be out of work for at least the next 6 months as she continues to recover from knee replacement surgery.

## 2019-08-19 NOTE — Telephone Encounter (Signed)
Patient called.   She would like to move forward with filing for disability which she said was discussed with Ninfa Linden her last visit. She also wanted to know what she could do since her PT is ending but she would like to continue it.   Call back number: [phone]

## 2019-08-20 ENCOUNTER — Other Ambulatory Visit: Payer: Self-pay

## 2019-08-20 ENCOUNTER — Encounter: Payer: Self-pay | Admitting: Physical Therapy

## 2019-08-20 ENCOUNTER — Ambulatory Visit: Payer: PRIVATE HEALTH INSURANCE | Admitting: Physical Therapy

## 2019-08-20 DIAGNOSIS — R2689 Other abnormalities of gait and mobility: Secondary | ICD-10-CM

## 2019-08-20 DIAGNOSIS — M25662 Stiffness of left knee, not elsewhere classified: Secondary | ICD-10-CM

## 2019-08-20 DIAGNOSIS — R600 Localized edema: Secondary | ICD-10-CM

## 2019-08-20 DIAGNOSIS — R2681 Unsteadiness on feet: Secondary | ICD-10-CM

## 2019-08-20 DIAGNOSIS — R293 Abnormal posture: Secondary | ICD-10-CM

## 2019-08-20 DIAGNOSIS — M6281 Muscle weakness (generalized): Secondary | ICD-10-CM

## 2019-08-20 DIAGNOSIS — G8929 Other chronic pain: Secondary | ICD-10-CM

## 2019-08-20 NOTE — Telephone Encounter (Signed)
Patient aware we have sent for more PT and [employer] takes care of the forms

## 2019-08-20 NOTE — Therapy (Signed)
[facility] [address], [place], [place], [address] Phone: [phone]   Fax:  [phone]  Physical Therapy Treatment  Patient Details  Name: Sharon [name] MRN: [id number] Date of Birth: 01-23-1966 Referring Provider (PT): Jean Rosenthal, MD   Encounter Date: 08/20/2019  PT End of Session - 08/20/19 1230    Visit Number  27    Number of Visits  30    Date for PT Re-Evaluation  09/04/19    Authorization Type  [employer] - 30 visits beginning 2021 - track visits below    Authorization Time Period  MET deductible,  100% covered for 2020    Authorization - Number of Visits  30    PT Start Time  1227    PT Stop Time  1316    PT Time Calculation (min)  49 min    Activity Tolerance  Patient limited by pain    Behavior During Therapy  [facility] for tasks assessed/performed       Past Medical History:  Diagnosis Date  . Allergy   . Anal fissure   . Anemia    borderline  . Anxiety   . Arthritis   . Blood clot in vein    left leg  . Detached retina    BOTH SCLERA BUCKLE BOTH EYES  . DVT (deep venous thrombosis) (HCC)    left leg  . Endometriosis   . Family history of adverse reaction to anesthesia    DAUGHTER POST OP PONV  . GERD (gastroesophageal reflux disease)   . Glaucoma    RIGHT WORST THAN LEFT  . Heart murmur    "caused by anxiety"   . History of hemorrhoids   . History of kidney stones   . Hypertension   . IBS (irritable bowel syndrome)   . Perforated eardrum, right    SMALL HOLE  . PONV (postoperative nausea and vomiting)   . Pulmonary embolism ([place]) 6 YRS AGO    Past Surgical History:  Procedure Laterality Date  . ABDOMINAL HYSTERECTOMY    . CATARACT EXTRACTION Bilateral 2015  . COLONOSCOPY    . CYSTOSCOPY W/ URETERAL STENT PLACEMENT Right 05/23/2017   Procedure: CYSTOSCOPY WITH RETROGRADE PYELOGRAM/URETERAL RIGHT STENT PLACEMENT;  Surgeon: Bjorn Loser, MD;  Location: [facility];   Service: Urology;  Laterality: Right;  . CYSTOSCOPY W/ URETERAL STENT PLACEMENT Right 06/04/2017   Procedure: CYSTOSCOPY WITHRIGHT RETROGRADE PYELOGRAMRIGHT /URETEREOSCOPY  STENT PLACEMENT;  Surgeon: Lucas Mallow, MD;  Location: [facility];  Service: Urology;  Laterality: Right;  . ENDOMETRIAL ABLATION    . EYE SURGERY Bilateral    cataract surgery with lens implants  . [place]  2011  . INCONTINENCE SURGERY    . INNER EAR SURGERY     X 2  . KNEE SURGERY    . RECTAL PROLAPSE REPAIR    . RETINAL DETACHMENT SURGERY Bilateral 2000  . RETINAL DETACHMENT SURGERY Bilateral   . TOTAL KNEE ARTHROPLASTY Left 04/29/2019  . TOTAL KNEE ARTHROPLASTY Left 04/29/2019   Procedure: LEFT TOTAL KNEE ARTHROPLASTY;  Surgeon: Mcarthur Rossetti, MD;  Location: [facility];  Service: Orthopedics;  Laterality: Left;  . TUBAL LIGATION    . VEIN SURGERY Left 04/2010    There were no vitals filed for this visit.  Subjective Assessment - 08/20/19 1231    Subjective  The inside part of my knee swelled up after last session. My skin is super tight and my pain is  up.    Pertinent History  right shoulder impingement, cervicalgia, right Trochanteric bursitis, dyspnea, HTN,    Currently in Pain?  Yes    Pain Score  8     Pain Location  Knee    Pain Orientation  Left    Pain Descriptors / Indicators  Sore;Tightness    Aggravating Factors   Bending the knee    Pain Relieving Factors  Meds, ice    Multiple Pain Sites  No         [facility] PT Assessment - 08/20/19 0001      PROM   Left Knee Flexion  110                   [facility] Adult PT Treatment/Exercise - 08/20/19 0001      Knee/Hip Exercises: Aerobic   Nustep  L4 seat 7 x 10 min      Vasopneumatic   Number Minutes Vasopneumatic   10 minutes    Vasopnuematic Location   Knee    Vasopneumatic Pressure  Medium    Vasopneumatic Temperature   3 flakes      Manual Therapy   Soft tissue mobilization  Peri patella,  medial hamstring     Passive ROM  passive knee flex in prone, CR in prone   and in sitting                 PT Long Term Goals - 07/23/19 1459      PT LONG TERM GOAL #1   Title  PROM Left knee flexion 120 degrees    Status  On-going      PT LONG TERM GOAL #2   Title  PROM Left knee extension -10    Status  On-going      PT LONG TERM GOAL #3   Title  Single leg stance LLE >/= 5 sec    Baseline  13 sec    Status  Achieved      PT LONG TERM GOAL #5   Title  Patient ambulates >400' without assistive device modified independent.    Baseline  able to walk without AD    Status  On-going      PT LONG TERM GOAL #6   Title  Patient negotiates stairs with single rail, ramps & curbs without assistive device modified independent.    Status  Achieved      PT LONG TERM GOAL #7   Title  Patient reports left knee pain </= 6/10 without prescription pain medications (using only over the counter meds)    Status  On-going            Plan - 08/20/19 1230    Clinical Impression Statement  Pt arrives with higher levels of pain and complaints of tightness. We skipped teh leg press and othe exercises as she can do most of them at home and focused mostly on manual techniques to increase knee flexion. Knee flexion passive 110 post session. Medial knee appears more swollen today.    Personal Factors and Comorbidities  Comorbidity 3+;Fitness;Past/Current Experience    Comorbidities  right shoulder impingement, cervicalgia, right Trochanteric bursitis, dyspnea, HTN    Examination-Activity Limitations  Bend;Lift;Locomotion Level;Stand;Stairs;Transfers    Examination-Participation Restrictions  Community Activity    Stability/Clinical Decision Making  Stable/Uncomplicated    Rehab Potential  Good    PT Frequency  3x / week    PT Duration  6 weeks    PT Treatment/Interventions  ADLs/Self Care Home  Management;Electrical Stimulation;Moist Heat;DME Instruction;Gait training;Stair  training;Functional mobility training;Therapeutic activities;Therapeutic exercise;Balance training;Neuromuscular re-education;Patient/family education;Manual techniques;Scar mobilization;Compression bandaging;Passive range of motion;Dry needling;Taping;Vasopneumatic Device;Joint Manipulations    PT Next Visit Plan  Knee strength and ROM,    PT Home Exercise Plan  [facility]    Consulted and Agree with Plan of Care  Patient       Patient will benefit from skilled therapeutic intervention in order to improve the following deficits and impairments:  Abnormal gait, Decreased activity tolerance, Decreased balance, Decreased endurance, Decreased mobility, Decreased range of motion, Decreased skin integrity, Decreased scar mobility, Decreased strength, Increased edema, Impaired flexibility, Postural dysfunction, Pain  Visit Diagnosis: Unsteadiness on feet  Other abnormalities of gait and mobility  Abnormal posture  Localized edema  Chronic pain of left knee  Muscle weakness  Stiffness of left knee, not elsewhere classified     Problem List Patient Active Problem List   Diagnosis Date Noted  . Status post total left knee replacement 04/29/2019  . Impingement syndrome of right shoulder 10/28/2018  . Chronic pain of left knee 03/27/2018  . Chronic right shoulder pain 03/27/2018  . Cervicalgia 03/27/2018  . Exposure of implanted vaginal mesh ([place]) 03/06/2018  . Outlet dysfunction constipation 03/06/2018  . Status post arthroscopy of left knee 08/06/2017  . Unilateral primary osteoarthritis, left knee 08/06/2017  . Tremor 07/10/2017  . Other meniscus derangements, posterior horn of medial meniscus, left knee 06/28/2017  . Ureteral stone 05/23/2017  . History of sepsis 05/23/2017  . Acute pain of left knee 04/09/2017  . Trochanteric bursitis, right hip 09/27/2016  . GAD (generalized anxiety disorder) 05/31/2014  . Rectocele 08/21/2011  . Uterovaginal prolapse 08/21/2011  . Dyspnea  10/13/2010  . Hypertension 10/13/2010    Yanelie Abraha, PTA 08/20/2019, 1:09 PM  [facility] [address] [place], [place], [address] Phone: [phone]   Fax:  [phone]  Name: Kami Kube MRN: [id number] Date of Birth: 04-24-1966

## 2019-08-22 ENCOUNTER — Ambulatory Visit: Payer: No Typology Code available for payment source | Admitting: Physical Therapy

## 2019-08-25 ENCOUNTER — Ambulatory Visit: Payer: PRIVATE HEALTH INSURANCE | Admitting: Physical Therapy

## 2019-08-25 ENCOUNTER — Other Ambulatory Visit: Payer: Self-pay

## 2019-08-25 ENCOUNTER — Encounter: Payer: Self-pay | Admitting: Physical Therapy

## 2019-08-25 DIAGNOSIS — R2689 Other abnormalities of gait and mobility: Secondary | ICD-10-CM

## 2019-08-25 DIAGNOSIS — R2681 Unsteadiness on feet: Secondary | ICD-10-CM | POA: Diagnosis not present

## 2019-08-25 DIAGNOSIS — R600 Localized edema: Secondary | ICD-10-CM

## 2019-08-25 DIAGNOSIS — M6281 Muscle weakness (generalized): Secondary | ICD-10-CM

## 2019-08-25 DIAGNOSIS — R293 Abnormal posture: Secondary | ICD-10-CM

## 2019-08-25 DIAGNOSIS — G8929 Other chronic pain: Secondary | ICD-10-CM

## 2019-08-25 DIAGNOSIS — M25662 Stiffness of left knee, not elsewhere classified: Secondary | ICD-10-CM

## 2019-08-25 NOTE — Therapy (Signed)
[facility] [address] [place], [place], [address] Phone: [phone]   Fax:  [phone]  Physical Therapy Treatment  Patient Details  Name: Sharon [name] MRN: [id number] Date of Birth: March 31, 1966 Referring Provider (PT): Jean Rosenthal, MD   Encounter Date: 08/25/2019  PT End of Session - 08/25/19 1450    Visit Number  28    Number of Visits  30    Date for PT Re-Evaluation  11/17/19    Authorization Type  [employer] - 30 visits beginning 2021 - track visits below    PT Start Time  1444    PT Stop Time  1535    PT Time Calculation (min)  51 min    Activity Tolerance  Patient limited by pain       Past Medical History:  Diagnosis Date  . Allergy   . Anal fissure   . Anemia    borderline  . Anxiety   . Arthritis   . Blood clot in vein    left leg  . Detached retina    BOTH SCLERA BUCKLE BOTH EYES  . DVT (deep venous thrombosis) (HCC)    left leg  . Endometriosis   . Family history of adverse reaction to anesthesia    DAUGHTER POST OP PONV  . GERD (gastroesophageal reflux disease)   . Glaucoma    RIGHT WORST THAN LEFT  . Heart murmur    "caused by anxiety"   . History of hemorrhoids   . History of kidney stones   . Hypertension   . IBS (irritable bowel syndrome)   . Perforated eardrum, right    SMALL HOLE  . PONV (postoperative nausea and vomiting)   . Pulmonary embolism ([place]) 6 YRS AGO    Past Surgical History:  Procedure Laterality Date  . ABDOMINAL HYSTERECTOMY    . CATARACT EXTRACTION Bilateral 2015  . COLONOSCOPY    . CYSTOSCOPY W/ URETERAL STENT PLACEMENT Right 05/23/2017   Procedure: CYSTOSCOPY WITH RETROGRADE PYELOGRAM/URETERAL RIGHT STENT PLACEMENT;  Surgeon: Bjorn Loser, MD;  Location: [facility];  Service: Urology;  Laterality: Right;  . CYSTOSCOPY W/ URETERAL STENT PLACEMENT Right 06/04/2017   Procedure: CYSTOSCOPY WITHRIGHT RETROGRADE PYELOGRAMRIGHT /URETEREOSCOPY   STENT PLACEMENT;  Surgeon: Lucas Mallow, MD;  Location: [facility];  Service: Urology;  Laterality: Right;  . ENDOMETRIAL ABLATION    . EYE SURGERY Bilateral    cataract surgery with lens implants  . [place]  2011  . INCONTINENCE SURGERY    . INNER EAR SURGERY     X 2  . KNEE SURGERY    . RECTAL PROLAPSE REPAIR    . RETINAL DETACHMENT SURGERY Bilateral 2000  . RETINAL DETACHMENT SURGERY Bilateral   . TOTAL KNEE ARTHROPLASTY Left 04/29/2019  . TOTAL KNEE ARTHROPLASTY Left 04/29/2019   Procedure: LEFT TOTAL KNEE ARTHROPLASTY;  Surgeon: Mcarthur Rossetti, MD;  Location: [facility];  Service: Orthopedics;  Laterality: Left;  . TUBAL LIGATION    . VEIN SURGERY Left 04/2010    There were no vitals filed for this visit.  Subjective Assessment - 08/25/19 1453    Subjective  I am more stiff today    Patient Stated Goals  To walk without cane or pain.    Currently in Pain?  Yes    Pain Score  6     Pain Location  Knee    Pain Orientation  Left    Pain Descriptors / Indicators  Tightness;Sore    Pain Type  Chronic pain    Pain Onset  More than a month ago    Pain Frequency  Intermittent    Aggravating Factors   bending    Pain Relieving Factors  ice    Multiple Pain Sites  No         [facility] PT Assessment - 08/25/19 0001      Assessment   Medical Diagnosis  Left TKR    Referring Provider (PT)  Jean Rosenthal, MD    Onset Date/Surgical Date  04/29/19    Hand Dominance  Right      PROM   Left Knee Extension  lacking 8 deg prone   approximate measurement without goniometer   Left Knee Flexion  114      Strength   Left Knee Flexion  5/5    Left Knee Extension  4+/5                   [facility] Adult PT Treatment/Exercise - 08/25/19 0001      Knee/Hip Exercises: Aerobic   Nustep  L3 seat 7 x 10 min      Vasopneumatic   Number Minutes Vasopneumatic   10 minutes    Vasopnuematic Location   Knee    Vasopneumatic Pressure   Medium    Vasopneumatic Temperature   3 flakes      Manual Therapy   Joint Mobilization  P/A tibiofemoral mobs in prone with towel under thigh    Soft tissue mobilization  Peri patella, medial hamstring     Passive ROM  passive knee flex and ext in prone, CR in prone using both quad and hamstring for CR                  PT Long Term Goals - 08/25/19 1657      PT LONG TERM GOAL #1   Title  PROM Left knee flexion 120 degrees    Baseline  114 (08/25/19)    Time  12    Period  Weeks    Status  On-going    Target Date  11/17/19      PT LONG TERM GOAL #2   Title  PROM Left knee extension 0    Baseline  appears 8-10 away from neutral    Time  12    Period  Weeks    Status  Revised    Target Date  11/17/19      PT LONG TERM GOAL #3   Title  Single leg stance LLE >/= 5 sec    Status  Achieved      PT LONG TERM GOAL #4   Title  ambulates with heel strike and arm swing at least 2 laps around the gym (appx 150 ft)    Time  12    Period  Weeks    Status  Revised    Target Date  11/17/19      PT LONG TERM GOAL #5   Title  Patient ambulates >400' without assistive device modified independent.    Time  12    Period  Weeks    Status  On-going    Target Date  11/17/19      PT LONG TERM GOAL #6   Title  Patient negotiates stairs with single rail, ramps & curbs without assistive device modified independent.    Status  Achieved      PT LONG TERM GOAL #7   Title  Patient reports left knee pain </= 6/10 without prescription pain medications (using only over the counter meds)   last day of perscription meds is tomorrow   Time  12    Period  Weeks    Status  On-going    Target Date  11/17/19            Plan - 08/25/19 1632    Clinical Impression Statement  Pt continues to make slow and steady progress.  Pt needed cues to open her stride and get increased hip extension and arm swing during gait.  She was able to demosntrate 114 degrees of knee flexion.  Pt has  increased knee strength  She is doing well with her HEP needing minor guidance to ensure she is focusing on knee ext and quad strength.  Session focused on manual for increased ROM and edema management.  She is recommended to continue with skilled PT to ensure she will get maximum ROM and function of the Lt LE.    Personal Factors and Comorbidities  Comorbidity 3+;Fitness;Past/Current Experience    Comorbidities  right shoulder impingement, cervicalgia, right Trochanteric bursitis, dyspnea, HTN    Examination-Activity Limitations  Bend;Lift;Locomotion Level;Stand;Stairs;Transfers    Stability/Clinical Decision Making  Stable/Uncomplicated    Clinical Decision Making  Low    Rehab Potential  Excellent    PT Frequency  2x / week    PT Duration  12 weeks    PT Treatment/Interventions  ADLs/Self Care Home Management;Electrical Stimulation;Moist Heat;DME Instruction;Gait training;Stair training;Functional mobility training;Therapeutic activities;Therapeutic exercise;Balance training;Neuromuscular re-education;Patient/family education;Manual techniques;Scar mobilization;Compression bandaging;Passive range of motion;Dry needling;Taping;Vasopneumatic Device;Joint Manipulations    PT Next Visit Plan  Knee strength and ROM,    PT Home Exercise Plan  [facility]    Consulted and Agree with Plan of Care  Patient       Patient will benefit from skilled therapeutic intervention in order to improve the following deficits and impairments:  Abnormal gait, Decreased activity tolerance, Decreased balance, Decreased endurance, Decreased mobility, Decreased range of motion, Decreased skin integrity, Decreased scar mobility, Decreased strength, Increased edema, Impaired flexibility, Postural dysfunction, Pain  Visit Diagnosis: Unsteadiness on feet  Other abnormalities of gait and mobility  Abnormal posture  Localized edema  Chronic pain of left knee  Muscle weakness  Stiffness of left knee, not elsewhere  classified     Problem List Patient Active Problem List   Diagnosis Date Noted  . Status post total left knee replacement 04/29/2019  . Impingement syndrome of right shoulder 10/28/2018  . Chronic pain of left knee 03/27/2018  . Chronic right shoulder pain 03/27/2018  . Cervicalgia 03/27/2018  . Exposure of implanted vaginal mesh ([place]) 03/06/2018  . Outlet dysfunction constipation 03/06/2018  . Status post arthroscopy of left knee 08/06/2017  . Unilateral primary osteoarthritis, left knee 08/06/2017  . Tremor 07/10/2017  . Other meniscus derangements, posterior horn of medial meniscus, left knee 06/28/2017  . Ureteral stone 05/23/2017  . History of sepsis 05/23/2017  . Acute pain of left knee 04/09/2017  . Trochanteric bursitis, right hip 09/27/2016  . GAD (generalized anxiety disorder) 05/31/2014  . Rectocele 08/21/2011  . Uterovaginal prolapse 08/21/2011  . Dyspnea 10/13/2010  . Hypertension 10/13/2010    Jule Ser, PT 08/25/2019, 5:01 PM  [facility] [address], [place], [place], [address] Phone: [phone]   Fax:  [phone]  Name: Shawntay Ketcham MRN: [id number] Date of Birth: 11-08-1965

## 2019-08-27 ENCOUNTER — Encounter: Payer: Self-pay | Admitting: Physical Therapy

## 2019-08-27 ENCOUNTER — Other Ambulatory Visit: Payer: Self-pay

## 2019-08-27 ENCOUNTER — Ambulatory Visit: Payer: PRIVATE HEALTH INSURANCE | Admitting: Physical Therapy

## 2019-08-27 DIAGNOSIS — G8929 Other chronic pain: Secondary | ICD-10-CM

## 2019-08-27 DIAGNOSIS — R600 Localized edema: Secondary | ICD-10-CM

## 2019-08-27 DIAGNOSIS — R293 Abnormal posture: Secondary | ICD-10-CM

## 2019-08-27 DIAGNOSIS — R2681 Unsteadiness on feet: Secondary | ICD-10-CM

## 2019-08-27 DIAGNOSIS — M6281 Muscle weakness (generalized): Secondary | ICD-10-CM

## 2019-08-27 DIAGNOSIS — R2689 Other abnormalities of gait and mobility: Secondary | ICD-10-CM

## 2019-08-27 DIAGNOSIS — M25662 Stiffness of left knee, not elsewhere classified: Secondary | ICD-10-CM

## 2019-08-27 NOTE — Therapy (Signed)
[facility] [address] [place], [place], [address] Phone: [phone]   Fax:  [phone]  Physical Therapy Treatment  Patient Details  Name: Sharon [name] MRN: [id number] Date of Birth: 1965-07-27 Referring Provider (PT): Jean Rosenthal, MD   Encounter Date: 08/27/2019  PT End of Session - 08/27/19 1444    Visit Number  29    Number of Visits  30    Date for PT Re-Evaluation  11/17/19    Authorization Type  [employer] - 30 visits beginning 2021 - track visits below    Authorization Time Period  MET deductible,  100% covered for 2020    PT Start Time  1443    PT Stop Time  1535    PT Time Calculation (min)  52 min    Activity Tolerance  Patient tolerated treatment well    Behavior During Therapy  [facility] for tasks assessed/performed       Past Medical History:  Diagnosis Date  . Allergy   . Anal fissure   . Anemia    borderline  . Anxiety   . Arthritis   . Blood clot in vein    left leg  . Detached retina    BOTH SCLERA BUCKLE BOTH EYES  . DVT (deep venous thrombosis) (HCC)    left leg  . Endometriosis   . Family history of adverse reaction to anesthesia    DAUGHTER POST OP PONV  . GERD (gastroesophageal reflux disease)   . Glaucoma    RIGHT WORST THAN LEFT  . Heart murmur    "caused by anxiety"   . History of hemorrhoids   . History of kidney stones   . Hypertension   . IBS (irritable bowel syndrome)   . Perforated eardrum, right    SMALL HOLE  . PONV (postoperative nausea and vomiting)   . Pulmonary embolism ([place]) 6 YRS AGO    Past Surgical History:  Procedure Laterality Date  . ABDOMINAL HYSTERECTOMY    . CATARACT EXTRACTION Bilateral 2015  . COLONOSCOPY    . CYSTOSCOPY W/ URETERAL STENT PLACEMENT Right 05/23/2017   Procedure: CYSTOSCOPY WITH RETROGRADE PYELOGRAM/URETERAL RIGHT STENT PLACEMENT;  Surgeon: Bjorn Loser, MD;  Location: [facility];  Service: Urology;  Laterality:  Right;  . CYSTOSCOPY W/ URETERAL STENT PLACEMENT Right 06/04/2017   Procedure: CYSTOSCOPY WITHRIGHT RETROGRADE PYELOGRAMRIGHT /URETEREOSCOPY  STENT PLACEMENT;  Surgeon: Lucas Mallow, MD;  Location[facility];  Service: Urology;  Laterality: Right;  . ENDOMETRIAL ABLATION    . EYE SURGERY Bilateral    cataract surgery with lens implants  . [place]  2011  . INCONTINENCE SURGERY    . INNER EAR SURGERY     X 2  . KNEE SURGERY    . RECTAL PROLAPSE REPAIR    . RETINAL DETACHMENT SURGERY Bilateral 2000  . RETINAL DETACHMENT SURGERY Bilateral   . TOTAL KNEE ARTHROPLASTY Left 04/29/2019  . TOTAL KNEE ARTHROPLASTY Left 04/29/2019   Procedure: LEFT TOTAL KNEE ARTHROPLASTY;  Surgeon: Mcarthur Rossetti, MD;  Location: [place];  Service: Orthopedics;  Laterality: Left;  . TUBAL LIGATION    . VEIN SURGERY Left 04/2010    There were no vitals filed for this visit.  Subjective Assessment - 08/27/19 1446    Subjective  My pain and stiffness is up. I exercised in the pool yesterday for 45 min. I am in the midst of changing my meds so it has been difficult.  Pertinent History  right shoulder impingement, cervicalgia, right Trochanteric bursitis, dyspnea, HTN,    Currently in Pain?  Yes    Pain Score  9     Pain Location  Knee    Pain Orientation  Left    Pain Descriptors / Indicators  Sharp    Multiple Pain Sites  No         [facility] PT Assessment - 08/27/19 0001      PROM   Left Knee Flexion  116                   [facility] Adult PT Treatment/Exercise - 08/27/19 0001      Knee/Hip Exercises: Aerobic   Nustep  L3 seat 7 x 10 min      Knee/Hip Exercises: Seated   Long Arc Quad  --   Assisted LAQ with RTLE 10x     Manual Therapy   Passive ROM  passive knee flex and ext in prone, CR in prone using both quad and hamstring for CR                  PT Long Term Goals - 08/25/19 1657      PT LONG TERM GOAL #1   Title  PROM Left  knee flexion 120 degrees    Baseline  114 (08/25/19)    Time  12    Period  Weeks    Status  On-going    Target Date  11/17/19      PT LONG TERM GOAL #2   Title  PROM Left knee extension 0    Baseline  appears 8-10 away from neutral    Time  12    Period  Weeks    Status  Revised    Target Date  11/17/19      PT LONG TERM GOAL #3   Title  Single leg stance LLE >/= 5 sec    Status  Achieved      PT LONG TERM GOAL #4   Title  ambulates with heel strike and arm swing at least 2 laps around the gym (appx 150 ft)    Time  12    Period  Weeks    Status  Revised    Target Date  11/17/19      PT LONG TERM GOAL #5   Title  Patient ambulates >400' without assistive device modified independent.    Time  12    Period  Weeks    Status  On-going    Target Date  11/17/19      PT LONG TERM GOAL #6   Title  Patient negotiates stairs with single rail, ramps & curbs without assistive device modified independent.    Status  Achieved      PT LONG TERM GOAL #7   Title  Patient reports left knee pain </= 6/10 without prescription pain medications (using only over the counter meds)   last day of perscription meds is tomorrow   Time  12    Period  Weeks    Status  On-going    Target Date  11/17/19            Plan - 08/27/19 1445    Clinical Impression Statement  Pt arrives today with increased pain and 'tightness." Extended manual work on knee today. PROM for knee flexion 116 degrees.    Personal Factors and Comorbidities  Comorbidity 3+;Fitness;Past/Current Experience    Comorbidities  right shoulder impingement, cervicalgia,  right Trochanteric bursitis, dyspnea, HTN    Examination-Activity Limitations  Bend;Lift;Locomotion Level;Stand;Stairs;Transfers    Examination-Participation Restrictions  Community Activity    Stability/Clinical Decision Making  Stable/Uncomplicated    Rehab Potential  Excellent    PT Frequency  2x / week    PT Duration  12 weeks    PT  Treatment/Interventions  ADLs/Self Care Home Management;Electrical Stimulation;Moist Heat;DME Instruction;Gait training;Stair training;Functional mobility training;Therapeutic activities;Therapeutic exercise;Balance training;Neuromuscular re-education;Patient/family education;Manual techniques;Scar mobilization;Compression bandaging;Passive range of motion;Dry needling;Taping;Vasopneumatic Device;Joint Manipulations    PT Next Visit Plan  Knee strength and ROM,    PT Home Exercise Plan  [facility]       Patient will benefit from skilled therapeutic intervention in order to improve the following deficits and impairments:  Abnormal gait, Decreased activity tolerance, Decreased balance, Decreased endurance, Decreased mobility, Decreased range of motion, Decreased skin integrity, Decreased scar mobility, Decreased strength, Increased edema, Impaired flexibility, Postural dysfunction, Pain  Visit Diagnosis: Other abnormalities of gait and mobility  Unsteadiness on feet  Abnormal posture  Localized edema  Chronic pain of left knee  Muscle weakness  Stiffness of left knee, not elsewhere classified     Problem List Patient Active Problem List   Diagnosis Date Noted  . Status post total left knee replacement 04/29/2019  . Impingement syndrome of right shoulder 10/28/2018  . Chronic pain of left knee 03/27/2018  . Chronic right shoulder pain 03/27/2018  . Cervicalgia 03/27/2018  . Exposure of implanted vaginal mesh ([place]) 03/06/2018  . Outlet dysfunction constipation 03/06/2018  . Status post arthroscopy of left knee 08/06/2017  . Unilateral primary osteoarthritis, left knee 08/06/2017  . Tremor 07/10/2017  . Other meniscus derangements, posterior horn of medial meniscus, left knee 06/28/2017  . Ureteral stone 05/23/2017  . History of sepsis 05/23/2017  . Acute pain of left knee 04/09/2017  . Trochanteric bursitis, right hip 09/27/2016  . GAD (generalized anxiety disorder) 05/31/2014   . Rectocele 08/21/2011  . Uterovaginal prolapse 08/21/2011  . Dyspnea 10/13/2010  . Hypertension 10/13/2010    Sharon [name], PTA 08/27/2019, 3:23 PM  [facility] [address] [place], [place], [address] Phone: [phone]   Fax:  [phone]  Name: Sharon [name] MRN: [id number] Date of Birth: [birth year]

## 2019-08-29 ENCOUNTER — Encounter: Payer: No Typology Code available for payment source | Admitting: Physical Therapy

## 2019-09-01 ENCOUNTER — Ambulatory Visit: Payer: No Typology Code available for payment source | Admitting: Physical Therapy

## 2019-09-03 ENCOUNTER — Encounter: Payer: Self-pay | Admitting: Physical Therapy

## 2019-09-03 ENCOUNTER — Ambulatory Visit: Payer: PRIVATE HEALTH INSURANCE | Admitting: Physical Therapy

## 2019-09-03 ENCOUNTER — Other Ambulatory Visit: Payer: Self-pay

## 2019-09-03 DIAGNOSIS — R2681 Unsteadiness on feet: Secondary | ICD-10-CM

## 2019-09-03 DIAGNOSIS — R600 Localized edema: Secondary | ICD-10-CM

## 2019-09-03 DIAGNOSIS — G8929 Other chronic pain: Secondary | ICD-10-CM

## 2019-09-03 DIAGNOSIS — M6281 Muscle weakness (generalized): Secondary | ICD-10-CM

## 2019-09-03 DIAGNOSIS — R2689 Other abnormalities of gait and mobility: Secondary | ICD-10-CM

## 2019-09-03 DIAGNOSIS — M25662 Stiffness of left knee, not elsewhere classified: Secondary | ICD-10-CM

## 2019-09-03 NOTE — Therapy (Signed)
[facility] [address] [place], [place], [address] Phone: [phone]   Fax:  [phone]  Physical Therapy Treatment  Patient Details  Name: Sharon [name] MRN: [id number] Date of Birth: [birth year] Referring Provider (PT): Jean Rosenthal, MD   Encounter Date: 09/03/2019  PT End of Session - 09/03/19 1407    Visit Number  30    Number of Visits  30    Date for PT Re-Evaluation  11/17/19    Authorization Type  [employer] - 30 visits beginning 2021 - track visits below    Authorization Time Period  MET deductible,  100% covered for 2020    Authorization - Visit Number  69    Authorization - Number of Visits  30    PT Start Time  1406    PT Stop Time  1505    PT Time Calculation (min)  59 min    Activity Tolerance  Patient tolerated treatment well    Behavior During Therapy  [facility] for tasks assessed/performed       Past Medical History:  Diagnosis Date  . Allergy   . Anal fissure   . Anemia    borderline  . Anxiety   . Arthritis   . Blood clot in vein    left leg  . Detached retina    BOTH SCLERA BUCKLE BOTH EYES  . DVT (deep venous thrombosis) (HCC)    left leg  . Endometriosis   . Family history of adverse reaction to anesthesia    DAUGHTER POST OP PONV  . GERD (gastroesophageal reflux disease)   . Glaucoma    RIGHT WORST THAN LEFT  . Heart murmur    "caused by anxiety"   . History of hemorrhoids   . History of kidney stones   . Hypertension   . IBS (irritable bowel syndrome)   . Perforated eardrum, right    SMALL HOLE  . PONV (postoperative nausea and vomiting)   . Pulmonary embolism ([place]) 6 YRS AGO    Past Surgical History:  Procedure Laterality Date  . ABDOMINAL HYSTERECTOMY    . CATARACT EXTRACTION Bilateral 2015  . COLONOSCOPY    . CYSTOSCOPY W/ URETERAL STENT PLACEMENT Right 05/23/2017   Procedure: CYSTOSCOPY WITH RETROGRADE PYELOGRAM/URETERAL RIGHT STENT PLACEMENT;  Surgeon:  Bjorn Loser, MD;  [facility];  Service: Urology;  Laterality: Right;  . CYSTOSCOPY W/ URETERAL STENT PLACEMENT Right 06/04/2017   Procedure: CYSTOSCOPY WITHRIGHT RETROGRADE PYELOGRAMRIGHT /URETEREOSCOPY  STENT PLACEMENT;  Surgeon: Lucas Mallow, MD;  Location: [facility];  Service: Urology;  Laterality: Right;  . ENDOMETRIAL ABLATION    . EYE SURGERY Bilateral    cataract surgery with lens implants  . [place]  2011  . INCONTINENCE SURGERY    . INNER EAR SURGERY     X 2  . KNEE SURGERY    . RECTAL PROLAPSE REPAIR    . RETINAL DETACHMENT SURGERY Bilateral 2000  . RETINAL DETACHMENT SURGERY Bilateral   . TOTAL KNEE ARTHROPLASTY Left 04/29/2019  . TOTAL KNEE ARTHROPLASTY Left 04/29/2019   Procedure: LEFT TOTAL KNEE ARTHROPLASTY;  Surgeon: Mcarthur Rossetti, MD;  Location: [place];  Service: Orthopedics;  Laterality: Left;  . TUBAL LIGATION    . VEIN SURGERY Left 04/2010    There were no vitals filed for this visit.  Subjective Assessment - 09/03/19 1410    Subjective  I walked sideways in the pool and I think the water "  pushed my skin against the metal" and I am sore, bruised, and it feels like something is pushing the metal out. i will have the docotor check it.    Pertinent History  right shoulder impingement, cervicalgia, right Trochanteric bursitis, dyspnea, HTN,    Currently in Pain?  Yes    Pain Score  8     Pain Location  Knee    Pain Orientation  Left    Pain Descriptors / Indicators  Sore    Multiple Pain Sites  No                       [facility] Adult PT Treatment/Exercise - 09/03/19 0001      Knee/Hip Exercises: Aerobic   Nustep  L3 seat 7 x 10 min   Started with seat at ten then moved it up.      Knee/Hip Exercises: Seated   Long Arc Quad  Strengthening;Left;1 set;10 reps      Vasopneumatic   Number Minutes Vasopneumatic   10 minutes    Vasopnuematic Location   Knee    Vasopneumatic Pressure  Medium     Vasopneumatic Temperature   3 flakes   Also on 08/27/19     Manual Therapy   Passive ROM  passive knee flex and ext in prone, CR in prone using both quad and hamstring for CR                  PT Long Term Goals - 09/03/19 1450      PT LONG TERM GOAL #1   Title  PROM Left knee flexion 120 degrees    Time  12    Period  Weeks    Status  --   116           Plan - 09/03/19 1408    Clinical Impression Statement  Pt doing pool exercises 4-5x week in addition to her home exercises. Today pt's PROM for flexion was 116 ( took some time stretching to get there) in supine.    Personal Factors and Comorbidities  Comorbidity 3+;Fitness;Past/Current Experience    Comorbidities  right shoulder impingement, cervicalgia, right Trochanteric bursitis, dyspnea, HTN    Examination-Activity Limitations  Bend;Lift;Locomotion Level;Stand;Stairs;Transfers    Examination-Participation Restrictions  Community Activity    Stability/Clinical Decision Making  Stable/Uncomplicated    Rehab Potential  Excellent    PT Frequency  2x / week    PT Duration  12 weeks    PT Treatment/Interventions  ADLs/Self Care Home Management;Electrical Stimulation;Moist Heat;DME Instruction;Gait training;Stair training;Functional mobility training;Therapeutic activities;Therapeutic exercise;Balance training;Neuromuscular re-education;Patient/family education;Manual techniques;Scar mobilization;Compression bandaging;Passive range of motion;Dry needling;Taping;Vasopneumatic Device;Joint Manipulations    PT Next Visit Plan  Knee strength and ROM,    PT Home Exercise Plan  [facility]       Patient will benefit from skilled therapeutic intervention in order to improve the following deficits and impairments:  Abnormal gait, Decreased activity tolerance, Decreased balance, Decreased endurance, Decreased mobility, Decreased range of motion, Decreased skin integrity, Decreased scar mobility, Decreased strength, Increased edema,  Impaired flexibility, Postural dysfunction, Pain  Visit Diagnosis: Other abnormalities of gait and mobility  Unsteadiness on feet  Localized edema  Chronic pain of left knee  Muscle weakness  Stiffness of left knee, not elsewhere classified     Problem List Patient Active Problem List   Diagnosis Date Noted  . Status post total left knee replacement 04/29/2019  . Impingement syndrome of right shoulder 10/28/2018  . Chronic  pain of left knee 03/27/2018  . Chronic right shoulder pain 03/27/2018  . Cervicalgia 03/27/2018  . Exposure of implanted vaginal mesh ([place]) 03/06/2018  . Outlet dysfunction constipation 03/06/2018  . Status post arthroscopy of left knee 08/06/2017  . Unilateral primary osteoarthritis, left knee 08/06/2017  . Tremor 07/10/2017  . Other meniscus derangements, posterior horn of medial meniscus, left knee 06/28/2017  . Ureteral stone 05/23/2017  . History of sepsis 05/23/2017  . Acute pain of left knee 04/09/2017  . Trochanteric bursitis, right hip 09/27/2016  . GAD (generalized anxiety disorder) 05/31/2014  . Rectocele 08/21/2011  . Uterovaginal prolapse 08/21/2011  . Dyspnea 10/13/2010  . Hypertension 10/13/2010    Sharon [name], PTA 09/03/2019, 2:55 PM  [facility] [address] [place], [place], [address] Phone: [phone]   Fax:  [phone]  Name: Sharon [name] MRN: [id number] Date of Birth: [birth year]

## 2019-09-08 ENCOUNTER — Encounter: Payer: Self-pay | Admitting: Physical Therapy

## 2019-09-08 ENCOUNTER — Other Ambulatory Visit: Payer: Self-pay

## 2019-09-08 ENCOUNTER — Ambulatory Visit: Payer: PRIVATE HEALTH INSURANCE | Attending: Orthopaedic Surgery | Admitting: Physical Therapy

## 2019-09-08 DIAGNOSIS — G8929 Other chronic pain: Secondary | ICD-10-CM | POA: Insufficient documentation

## 2019-09-08 DIAGNOSIS — M6281 Muscle weakness (generalized): Secondary | ICD-10-CM

## 2019-09-08 DIAGNOSIS — R2689 Other abnormalities of gait and mobility: Secondary | ICD-10-CM | POA: Diagnosis present

## 2019-09-08 DIAGNOSIS — M25562 Pain in left knee: Secondary | ICD-10-CM | POA: Insufficient documentation

## 2019-09-08 DIAGNOSIS — M25662 Stiffness of left knee, not elsewhere classified: Secondary | ICD-10-CM | POA: Insufficient documentation

## 2019-09-08 DIAGNOSIS — R600 Localized edema: Secondary | ICD-10-CM | POA: Diagnosis present

## 2019-09-08 DIAGNOSIS — R2681 Unsteadiness on feet: Secondary | ICD-10-CM | POA: Diagnosis present

## 2019-09-08 DIAGNOSIS — R293 Abnormal posture: Secondary | ICD-10-CM | POA: Insufficient documentation

## 2019-09-08 NOTE — Therapy (Signed)
[facility] [address], [place], [place], [address] Phone: [phone]   Fax:  [phone]  Physical Therapy Treatment  Patient Details  Name: Sharon [name] MRN: [id number] Date of Birth: Sep 08, 1965 Referring Provider (PT): Jean Rosenthal, MD   Encounter Date: 09/08/2019  PT End of Session - 09/08/19 1233    Visit Number  31    Date for PT Re-Evaluation  11/17/19    Authorization Type  Medcost - 30 visits beginning 2021 - track visits below    Authorization Time Period  MET deductible,  100% covered for 2020    Authorization - Visit Number  31    PT Start Time  1232    PT Stop Time  1310    PT Time Calculation (min)  38 min    Activity Tolerance  Patient tolerated treatment well    Behavior During Therapy  [facility] for tasks assessed/performed       Past Medical History:  Diagnosis Date  . Allergy   . Anal fissure   . Anemia    borderline  . Anxiety   . Arthritis   . Blood clot in vein    left leg  . Detached retina    BOTH SCLERA BUCKLE BOTH EYES  . DVT (deep venous thrombosis) (HCC)    left leg  . Endometriosis   . Family history of adverse reaction to anesthesia    DAUGHTER POST OP PONV  . GERD (gastroesophageal reflux disease)   . Glaucoma    RIGHT WORST THAN LEFT  . Heart murmur    "caused by anxiety"   . History of hemorrhoids   . History of kidney stones   . Hypertension   . IBS (irritable bowel syndrome)   . Perforated eardrum, right    SMALL HOLE  . PONV (postoperative nausea and vomiting)   . Pulmonary embolism ([place]) 6 YRS AGO    Past Surgical History:  Procedure Laterality Date  . ABDOMINAL HYSTERECTOMY    . CATARACT EXTRACTION Bilateral 2015  . COLONOSCOPY    . CYSTOSCOPY W/ URETERAL STENT PLACEMENT Right 05/23/2017   Procedure: CYSTOSCOPY WITH RETROGRADE PYELOGRAM/URETERAL RIGHT STENT PLACEMENT;  Surgeon: Bjorn Loser, MD;  [facility];  Service: Urology;   Laterality: Right;  . CYSTOSCOPY W/ URETERAL STENT PLACEMENT Right 06/04/2017   Procedure: CYSTOSCOPY WITHRIGHT RETROGRADE PYELOGRAMRIGHT /URETEREOSCOPY  STENT PLACEMENT;  Surgeon: Lucas Mallow, MD;  Location: [facility];  Service: Urology;  Laterality: Right;  . ENDOMETRIAL ABLATION    . EYE SURGERY Bilateral    cataract surgery with lens implants  . [place]  2011  . INCONTINENCE SURGERY    . INNER EAR SURGERY     X 2  . KNEE SURGERY    . RECTAL PROLAPSE REPAIR    . RETINAL DETACHMENT SURGERY Bilateral 2000  . RETINAL DETACHMENT SURGERY Bilateral   . TOTAL KNEE ARTHROPLASTY Left 04/29/2019  . TOTAL KNEE ARTHROPLASTY Left 04/29/2019   Procedure: LEFT TOTAL KNEE ARTHROPLASTY;  Surgeon: Mcarthur Rossetti, MD;  Location: [place];  Service: Orthopedics;  Laterality: Left;  . TUBAL LIGATION    . VEIN SURGERY Left 04/2010    There were no vitals filed for this visit.  Subjective Assessment - 09/08/19 1235    Subjective  Pain is elevated. Taking medication daily. "Feels like the metal is rubbing the outside of my knee."    Pertinent History  right shoulder impingement, cervicalgia, right Trochanteric  bursitis, dyspnea, HTN,    Currently in Pain?  Yes    Pain Score  8     Pain Location  Knee    Pain Orientation  Left    Pain Descriptors / Indicators  Sore;Tightness    Aggravating Factors   bending    Pain Relieving Factors  ice, stetching in prone    Multiple Pain Sites  No         [facility] PT Assessment - 09/08/19 0001      PROM   Left Knee Flexion  115                   [facility] Adult PT Treatment/Exercise - 09/08/19 0001      Manual Therapy   Passive ROM  passive knee flex and ext in prone, CR in prone using both quad and hamstring for CR                  PT Long Term Goals - 09/03/19 1450      PT LONG TERM GOAL #1   Title  PROM Left knee flexion 120 degrees    Time  12    Period  Weeks    Status  --   116            Plan - 09/08/19 1234    Clinical Impression Statement  Pt reports high levels of pain and "rubbing" on the outside portion of her knee. She has been instructed to speak with her doctor on Thursday about his. PROM 116 degrees flexion at end of session.    Personal Factors and Comorbidities  Comorbidity 3+;Fitness;Past/Current Experience    Comorbidities  right shoulder impingement, cervicalgia, right Trochanteric bursitis, dyspnea, HTN    Examination-Activity Limitations  Bend;Lift;Locomotion Level;Stand;Stairs;Transfers    Examination-Participation Restrictions  Community Activity    Stability/Clinical Decision Making  Stable/Uncomplicated    Rehab Potential  Excellent    PT Frequency  2x / week    PT Duration  12 weeks    PT Treatment/Interventions  ADLs/Self Care Home Management;Electrical Stimulation;Moist Heat;DME Instruction;Gait training;Stair training;Functional mobility training;Therapeutic activities;Therapeutic exercise;Balance training;Neuromuscular re-education;Patient/family education;Manual techniques;Scar mobilization;Compression bandaging;Passive range of motion;Dry needling;Taping;Vasopneumatic Device;Joint Manipulations    PT Next Visit Plan  Check quad set ( pt is supposed to work on this) Pt is self pay so she would like to focus on stretching and othe manual techniques that she can't do at home    PT [place] and Agree with Plan of Care  Patient       Patient will benefit from skilled therapeutic intervention in order to improve the following deficits and impairments:  Abnormal gait, Decreased activity tolerance, Decreased balance, Decreased endurance, Decreased mobility, Decreased range of motion, Decreased skin integrity, Decreased scar mobility, Decreased strength, Increased edema, Impaired flexibility, Postural dysfunction, Pain  Visit Diagnosis: Other abnormalities of gait and mobility  Unsteadiness on feet  Localized  edema  Chronic pain of left knee  Muscle weakness  Stiffness of left knee, not elsewhere classified  Abnormal posture     Problem List Patient Active Problem List   Diagnosis Date Noted  . Status post total left knee replacement 04/29/2019  . Impingement syndrome of right shoulder 10/28/2018  . Chronic pain of left knee 03/27/2018  . Chronic right shoulder pain 03/27/2018  . Cervicalgia 03/27/2018  . Exposure of implanted vaginal mesh ([place]) 03/06/2018  . Outlet dysfunction constipation 03/06/2018  . Status post arthroscopy  of left knee 08/06/2017  . Unilateral primary osteoarthritis, left knee 08/06/2017  . Tremor 07/10/2017  . Other meniscus derangements, posterior horn of medial meniscus, left knee 06/28/2017  . Ureteral stone 05/23/2017  . History of sepsis 05/23/2017  . Acute pain of left knee 04/09/2017  . Trochanteric bursitis, right hip 09/27/2016  . GAD (generalized anxiety disorder) 05/31/2014  . Rectocele 08/21/2011  . Uterovaginal prolapse 08/21/2011  . Dyspnea 10/13/2010  . Hypertension 10/13/2010    COCHRAN,JENNIFER, PTA 09/08/2019, 1:19 PM  [facility] [address], [place], [place], [address] Phone: [phone]   Fax:  [phone]  Name: Sharon [name] MRN: [id number] Date of Birth: Nov 16, 1965

## 2019-09-10 ENCOUNTER — Encounter: Payer: Self-pay | Admitting: Physical Therapy

## 2019-09-10 ENCOUNTER — Other Ambulatory Visit: Payer: Self-pay

## 2019-09-10 ENCOUNTER — Ambulatory Visit: Payer: PRIVATE HEALTH INSURANCE | Admitting: Physical Therapy

## 2019-09-10 DIAGNOSIS — M25662 Stiffness of left knee, not elsewhere classified: Secondary | ICD-10-CM

## 2019-09-10 DIAGNOSIS — R2689 Other abnormalities of gait and mobility: Secondary | ICD-10-CM | POA: Diagnosis not present

## 2019-09-10 DIAGNOSIS — R293 Abnormal posture: Secondary | ICD-10-CM

## 2019-09-10 DIAGNOSIS — M6281 Muscle weakness (generalized): Secondary | ICD-10-CM

## 2019-09-10 DIAGNOSIS — R2681 Unsteadiness on feet: Secondary | ICD-10-CM

## 2019-09-10 DIAGNOSIS — M25562 Pain in left knee: Secondary | ICD-10-CM

## 2019-09-10 DIAGNOSIS — R600 Localized edema: Secondary | ICD-10-CM

## 2019-09-10 DIAGNOSIS — G8929 Other chronic pain: Secondary | ICD-10-CM

## 2019-09-10 NOTE — Therapy (Signed)
[facility] [address] [place], [place], [address] Phone: [phone]   Fax:  [phone]  Physical Therapy Treatment  Patient Details  Name: Sharon [name] MRN: [id number] Date of Birth: March 12, 1966 Referring Provider (PT): Jean Rosenthal, MD   Encounter Date: 09/10/2019  PT End of Session - 09/10/19 1236    Visit Number  32    Date for PT Re-Evaluation  11/17/19    Authorization Type  Med[employer] - 30 visits beginning 2021 - track visits below    Authorization Time Period  MET deductible,  100% covered for 2020    Authorization - Visit Number  32    PT Start Time  1234    PT Stop Time  1315    PT Time Calculation (min)  41 min    Activity Tolerance  Patient tolerated treatment well    Behavior During Therapy  [facility] for tasks assessed/performed       Past Medical History:  Diagnosis Date  . Allergy   . Anal fissure   . Anemia    borderline  . Anxiety   . Arthritis   . Blood clot in vein    left leg  . Detached retina    BOTH SCLERA BUCKLE BOTH EYES  . DVT (deep venous thrombosis) (HCC)    left leg  . Endometriosis   . Family history of adverse reaction to anesthesia    DAUGHTER POST OP PONV  . GERD (gastroesophageal reflux disease)   . Glaucoma    RIGHT WORST THAN LEFT  . Heart murmur    "caused by anxiety"   . History of hemorrhoids   . History of kidney stones   . Hypertension   . IBS (irritable bowel syndrome)   . Perforated eardrum, right    SMALL HOLE  . PONV (postoperative nausea and vomiting)   . Pulmonary embolism ([place]) 6 YRS AGO    Past Surgical History:  Procedure Laterality Date  . ABDOMINAL HYSTERECTOMY    . CATARACT EXTRACTION Bilateral 2015  . COLONOSCOPY    . CYSTOSCOPY W/ URETERAL STENT PLACEMENT Right 05/23/2017   Procedure: CYSTOSCOPY WITH RETROGRADE PYELOGRAM/URETERAL RIGHT STENT PLACEMENT;  Surgeon: Bjorn Loser, MD;  [facility];  Service: Urology;   Laterality: Right;  . CYSTOSCOPY W/ URETERAL STENT PLACEMENT Right 06/04/2017   Procedure: CYSTOSCOPY WITHRIGHT RETROGRADE PYELOGRAMRIGHT /URETEREOSCOPY  STENT PLACEMENT;  Surgeon: Lucas Mallow, MD;  Location[facility];  Service: Urology;  Laterality: Right;  . ENDOMETRIAL ABLATION    . EYE SURGERY Bilateral    cataract surgery with lens implants  . [place]  2011  . INCONTINENCE SURGERY    . INNER EAR SURGERY     X 2  . KNEE SURGERY    . RECTAL PROLAPSE REPAIR    . RETINAL DETACHMENT SURGERY Bilateral 2000  . RETINAL DETACHMENT SURGERY Bilateral   . TOTAL KNEE ARTHROPLASTY Left 04/29/2019  . TOTAL KNEE ARTHROPLASTY Left 04/29/2019   Procedure: LEFT TOTAL KNEE ARTHROPLASTY;  Surgeon: Mcarthur Rossetti, MD;  Location: [place];  Service: Orthopedics;  Laterality: Left;  . TUBAL LIGATION    . VEIN SURGERY Left 04/2010    There were no vitals filed for this visit.  Subjective Assessment - 09/10/19 1239    Subjective  Pt is still taking hydrocodone before PT but no other times.  I still feel pain when I walk and the outside of the knee rubs and the hip  hurts.    Patient Stated Goals  To walk without cane or pain.    Currently in Pain?  Yes    Pain Score  6     Pain Location  Knee    Pain Orientation  Left    Pain Descriptors / Indicators  Sore;Tightness;Burning    Pain Type  Chronic pain    Pain Onset  More than a month ago    Pain Frequency  Intermittent    Aggravating Factors   walking    Multiple Pain Sites  No                       [facility] Adult PT Treatment/Exercise - 09/10/19 0001      Knee/Hip Exercises: Aerobic   Stationary Bike  slow: forward revolutions x5 minutes, PT encouraging seat closer intermittently as ROM improved       Manual Therapy   Joint Mobilization  P/A tibiofemoral mobs in prone with towel under thigh    Soft tissue mobilization  hamstrings, quads    Passive ROM  passive knee flex and ext in prone,  CR in prone using both quad and hamstring for CR                  PT Long Term Goals - 09/10/19 1428      PT LONG TERM GOAL #1   Title  PROM Left knee flexion 120 degrees    Baseline  117 (09/10/19)    Status  On-going            Plan - 09/10/19 1420    Clinical Impression Statement  Pt continue to reprot the same pain and feels like there is a piece of staple trying to pop out of incision.  Pt achieved 117 deg of PROM knee flexion after manual and contract relax techniques.    PT Treatment/Interventions  ADLs/Self Care Home Management;Electrical Stimulation;Moist Heat;DME Instruction;Gait training;Stair training;Functional mobility training;Therapeutic activities;Therapeutic exercise;Balance training;Neuromuscular re-education;Patient/family education;Manual techniques;Scar mobilization;Compression bandaging;Passive range of motion;Dry needling;Taping;Vasopneumatic Device;Joint Manipulations    PT Next Visit Plan  re-check quad set ( pt is supposed to work on this- reports she forgot today) Pt is self pay so she would like to focus on stretching and othe manual techniques that she can't do at home    PT [place] and Agree with Plan of Care  Patient       Patient will benefit from skilled therapeutic intervention in order to improve the following deficits and impairments:  Abnormal gait, Decreased activity tolerance, Decreased balance, Decreased endurance, Decreased mobility, Decreased range of motion, Decreased skin integrity, Decreased scar mobility, Decreased strength, Increased edema, Impaired flexibility, Postural dysfunction, Pain  Visit Diagnosis: Other abnormalities of gait and mobility  Unsteadiness on feet  Localized edema  Chronic pain of left knee  Muscle weakness  Stiffness of left knee, not elsewhere classified  Abnormal posture     Problem List Patient Active Problem List   Diagnosis Date Noted  . Status post  total left knee replacement 04/29/2019  . Impingement syndrome of right shoulder 10/28/2018  . Chronic pain of left knee 03/27/2018  . Chronic right shoulder pain 03/27/2018  . Cervicalgia 03/27/2018  . Exposure of implanted vaginal mesh ([place]) 03/06/2018  . Outlet dysfunction constipation 03/06/2018  . Status post arthroscopy of left knee 08/06/2017  . Unilateral primary osteoarthritis, left knee 08/06/2017  . Tremor 07/10/2017  . Other meniscus derangements,  posterior horn of medial meniscus, left knee 06/28/2017  . Ureteral stone 05/23/2017  . History of sepsis 05/23/2017  . Acute pain of left knee 04/09/2017  . Trochanteric bursitis, right hip 09/27/2016  . GAD (generalized anxiety disorder) 05/31/2014  . Rectocele 08/21/2011  . Uterovaginal prolapse 08/21/2011  . Dyspnea 10/13/2010  . Hypertension 10/13/2010    Jule Ser, PT 09/10/2019, 2:29 PM  [facility] [address], [place], [place], [address] Phone: [phone]   Fax:  [phone]  Name: Sharon [name] MRN: [id number] Date of Birth: 07-23-1965

## 2019-09-11 ENCOUNTER — Ambulatory Visit (INDEPENDENT_AMBULATORY_CARE_PROVIDER_SITE_OTHER): Payer: No Typology Code available for payment source

## 2019-09-11 ENCOUNTER — Encounter: Payer: Self-pay | Admitting: Orthopaedic Surgery

## 2019-09-11 ENCOUNTER — Ambulatory Visit (INDEPENDENT_AMBULATORY_CARE_PROVIDER_SITE_OTHER): Payer: No Typology Code available for payment source | Admitting: Orthopaedic Surgery

## 2019-09-11 DIAGNOSIS — Z96652 Presence of left artificial knee joint: Secondary | ICD-10-CM | POA: Diagnosis not present

## 2019-09-11 DIAGNOSIS — M7541 Impingement syndrome of right shoulder: Secondary | ICD-10-CM

## 2019-09-11 MED ORDER — METHYLPREDNISOLONE ACETATE 40 MG/ML IJ SUSP
40.0000 mg | INTRAMUSCULAR | Status: AC | PRN
  Administered 2019-09-11: 40 mg via INTRA_ARTICULAR

## 2019-09-11 MED ORDER — TIZANIDINE HCL 4 MG PO TABS: 4.0000 mg | ORAL_TABLET | Freq: Three times a day (TID) | ORAL | 1 refills | Status: DC | PRN

## 2019-09-11 MED ORDER — LIDOCAINE HCL 1 % IJ SOLN
3.0000 mL | INTRAMUSCULAR | Status: AC | PRN
Start: 2019-09-11 — End: 2019-09-11
  Administered 2019-09-11: 3 mL

## 2019-09-11 MED ORDER — HYDROCODONE-ACETAMINOPHEN 5-325 MG PO TABS: 1.0000 | ORAL_TABLET | Freq: Three times a day (TID) | ORAL | 0 refills | Status: DC | PRN

## 2019-09-11 NOTE — Progress Notes (Signed)
Office Visit Note   Patient: Sharon [name]           Date of Birth: Jun 16, 1966           MRN: [id number] Visit Date: 09/11/2019              Requested by: Forrest Moron, MD [place],  [place] [address] PCP: Forrest Moron, MD   Assessment & Plan: Visit Diagnoses:  1. Status post total left knee replacement   2. Impingement syndrome of right shoulder     Plan: Per the patient's wishes I did provide a steroid injection right shoulder subacromial space having done this before.  She is fully aware of the risk and benefits of steroid injections.  From a knee standpoint, she will continue aggressive therapy.  I will refill her hydrocodone and muscle relaxant.  I have counseled her about narcotics use and she understands it some point we will have to continue to wean her and get her off of these medications.  From my standpoint I do not need to see her back now for 3 months.  No x-rays are needed at that visit.  I reassured her today that the x-rays of her knee replacement look good.  I showed them to her as well.  Follow-Up Instructions: Return in about 3 months (around 12/12/2019).   Orders:  Orders Placed This Encounter  Procedures  . Large Joint Inj: R subacromial bursa  . XR Knee 1-2 Views Left   Meds ordered this encounter  Medications  . HYDROcodone-acetaminophen (NORCO/VICODIN) 5-325 MG tablet    Sig: Take 1-2 tablets by mouth 3 (three) times daily as needed for moderate pain.    Dispense:  40 tablet    Refill:  0  . tiZANidine (ZANAFLEX) 4 MG tablet    Sig: Take 1 tablet (4 mg total) by mouth every 8 (eight) hours as needed for muscle spasms.    Dispense:  60 tablet    Refill:  1      Procedures: Large Joint Inj: R subacromial bursa on 09/11/2019 3:13 PM Indications: pain and diagnostic evaluation Details: 22 G 1.5 in needle  Arthrogram: No  Medications: 3 mL lidocaine 1 %; 40 mg methylPREDNISolone acetate 40 MG/ML Outcome: tolerated well, no  immediate complications Procedure, treatment alternatives, risks and benefits explained, specific risks discussed. Consent was given by the patient. Immediately prior to procedure a time out was called to verify the correct patient, procedure, equipment, support staff and site/side marked as required. Patient was prepped and draped in the usual sterile fashion.       Clinical Data: No additional findings.   Subjective: Chief Complaint  Patient presents with  . Left Knee - Follow-up  The patient is now 4 and half months status post a left total knee arthroplasty.  She is really struggled with the pain from the surgery and getting her mobility back.  She does state that for this they have flexed her back and therapy is 117 degrees.  She is reporting right shoulder pain.  She has a known history of shoulder impingement syndrome.  She is requesting an injection in her shoulder today.  She is still in physical therapy and paying all this out of pocket to put herself along to try to get the best outcome for her knee that she can.  HPI  Review of Systems She currently denies any headache, chest pain, shortness of breath, fever, chills, nausea, vomiting  Objective: Vital  Signs: LMP 10/11/2010   Physical Exam She is alert and orient x3 and in no acute distress Ortho Exam Examination of her left operative knee shows her extension is getting close to full.  Her swelling is less.  I can flex her to only about 95 degrees today.  Examination of her right shoulder does show positive Neer and Hawkins signs with good range of motion.  The shoulder is strong but painful. Specialty Comments:  No specialty comments available.  Imaging: XR Knee 1-2 Views Left  Result Date: 09/11/2019 2 views of the left knee shows a well-seated total knee arthroplasty with no complicating features.    PMFS History: Patient Active Problem List   Diagnosis Date Noted  . Status post total left knee replacement  04/29/2019  . Impingement syndrome of right shoulder 10/28/2018  . Chronic pain of left knee 03/27/2018  . Chronic right shoulder pain 03/27/2018  . Cervicalgia 03/27/2018  . Exposure of implanted vaginal mesh ([place]) 03/06/2018  . Outlet dysfunction constipation 03/06/2018  . Status post arthroscopy of left knee 08/06/2017  . Unilateral primary osteoarthritis, left knee 08/06/2017  . Tremor 07/10/2017  . Other meniscus derangements, posterior horn of medial meniscus, left knee 06/28/2017  . Ureteral stone 05/23/2017  . History of sepsis 05/23/2017  . Acute pain of left knee 04/09/2017  . Trochanteric bursitis, right hip 09/27/2016  . GAD (generalized anxiety disorder) 05/31/2014  . Rectocele 08/21/2011  . Uterovaginal prolapse 08/21/2011  . Dyspnea 10/13/2010  . Hypertension 10/13/2010   Past Medical History:  Diagnosis Date  . Allergy   . Anal fissure   . Anemia    borderline  . Anxiety   . Arthritis   . Blood clot in vein    left leg  . Detached retina    BOTH SCLERA BUCKLE BOTH EYES  . DVT (deep venous thrombosis) (HCC)    left leg  . Endometriosis   . Family history of adverse reaction to anesthesia    DAUGHTER POST OP PONV  . GERD (gastroesophageal reflux disease)   . Glaucoma    RIGHT WORST THAN LEFT  . Heart murmur    "caused by anxiety"   . History of hemorrhoids   . History of kidney stones   . Hypertension   . IBS (irritable bowel syndrome)   . Perforated eardrum, right    SMALL HOLE  . PONV (postoperative nausea and vomiting)   . Pulmonary embolism ([place]) 6 YRS AGO    Family History  Problem Relation Age of Onset  . Emphysema Mother        smoker  . Allergies Mother   . Asthma Mother   . Heart disease Mother        AMI as cause of death  . COPD Mother   . Asthma Father   . Heart disease Father        AMI as cause of death  . Diabetes Father   . Asthma Brother   . Heart disease Brother 86       heart failure  . Lung cancer Maternal  Grandfather        was a smoker  . Cancer Maternal Grandfather        lung  . Heart disease Paternal Grandmother   . Heart disease Paternal Grandfather   . Colon cancer Neg Hx     Past Surgical History:  Procedure Laterality Date  . ABDOMINAL HYSTERECTOMY    . CATARACT EXTRACTION Bilateral 2015  .  COLONOSCOPY    . CYSTOSCOPY W/ URETERAL STENT PLACEMENT Right 05/23/2017   Procedure: CYSTOSCOPY WITH RETROGRADE PYELOGRAM/URETERAL RIGHT STENT PLACEMENT;  Surgeon: Bjorn Loser, MD;  [facility];  Service: Urology;  Laterality: Right;  . CYSTOSCOPY W/ URETERAL STENT PLACEMENT Right 06/04/2017   Procedure: CYSTOSCOPY WITHRIGHT RETROGRADE PYELOGRAMRIGHT /URETEREOSCOPY  STENT PLACEMENT;  Surgeon: Lucas Mallow, MD;  [facility];  Service: Urology;  Laterality: Right;  . ENDOMETRIAL ABLATION    . EYE SURGERY Bilateral    cataract surgery with lens implants  . [place]  2011  . INCONTINENCE SURGERY    . INNER EAR SURGERY     X 2  . KNEE SURGERY    . RECTAL PROLAPSE REPAIR    . RETINAL DETACHMENT SURGERY Bilateral 2000  . RETINAL DETACHMENT SURGERY Bilateral   . TOTAL KNEE ARTHROPLASTY Left 04/29/2019  . TOTAL KNEE ARTHROPLASTY Left 04/29/2019   Procedure: LEFT TOTAL KNEE ARTHROPLASTY;  Surgeon: Mcarthur Rossetti, MD;  Location: [place];  Service: Orthopedics;  Laterality: Left;  . TUBAL LIGATION    . VEIN SURGERY Left 04/2010   Social History   Occupational History  . Occupation: Restaurant manager, fast food: [employer]  Tobacco Use  . Smoking status: Never Smoker  . Smokeless tobacco: Never Used  Substance and Sexual Activity  . Alcohol use: Not Currently    Comment: occ  . Drug use: No    Comment: former maryjuana use- quit in 1995  . Sexual activity: Yes

## 2019-09-17 ENCOUNTER — Other Ambulatory Visit: Payer: Self-pay

## 2019-09-17 ENCOUNTER — Encounter: Payer: Self-pay | Admitting: Physical Therapy

## 2019-09-17 ENCOUNTER — Ambulatory Visit: Payer: PRIVATE HEALTH INSURANCE | Admitting: Physical Therapy

## 2019-09-17 DIAGNOSIS — M25662 Stiffness of left knee, not elsewhere classified: Secondary | ICD-10-CM

## 2019-09-17 DIAGNOSIS — G8929 Other chronic pain: Secondary | ICD-10-CM

## 2019-09-17 DIAGNOSIS — R2681 Unsteadiness on feet: Secondary | ICD-10-CM

## 2019-09-17 DIAGNOSIS — M25562 Pain in left knee: Secondary | ICD-10-CM

## 2019-09-17 DIAGNOSIS — R2689 Other abnormalities of gait and mobility: Secondary | ICD-10-CM

## 2019-09-17 DIAGNOSIS — R293 Abnormal posture: Secondary | ICD-10-CM

## 2019-09-17 DIAGNOSIS — R600 Localized edema: Secondary | ICD-10-CM

## 2019-09-17 DIAGNOSIS — M6281 Muscle weakness (generalized): Secondary | ICD-10-CM

## 2019-09-17 NOTE — Therapy (Signed)
[facility] [address], [place], [place], [address] Phone: [phone]   Fax:  [phone]  Physical Therapy Treatment  Patient Details  Name: Sharon [name] MRN: [id number] Date of Birth: 1966-03-10 Referring Provider (PT): Jean Rosenthal, MD   Encounter Date: 09/17/2019  PT End of Session - 09/17/19 1245    Visit Number  33    Date for PT Re-Evaluation  11/17/19    Authorization Type  Medcost - 30 visits beginning 2021 - track visits below    Authorization Time Period  MET deductible,  100% covered for 2020    Authorization - Visit Number  40    PT Start Time  1238    PT Stop Time  1316    PT Time Calculation (min)  38 min    Activity Tolerance  Patient tolerated treatment well    Behavior During Therapy  [facility] for tasks assessed/performed       Past Medical History:  Diagnosis Date  . Allergy   . Anal fissure   . Anemia    borderline  . Anxiety   . Arthritis   . Blood clot in vein    left leg  . Detached retina    BOTH SCLERA BUCKLE BOTH EYES  . DVT (deep venous thrombosis) (HCC)    left leg  . Endometriosis   . Family history of adverse reaction to anesthesia    DAUGHTER POST OP PONV  . GERD (gastroesophageal reflux disease)   . Glaucoma    RIGHT WORST THAN LEFT  . Heart murmur    "caused by anxiety"   . History of hemorrhoids   . History of kidney stones   . Hypertension   . IBS (irritable bowel syndrome)   . Perforated eardrum, right    SMALL HOLE  . PONV (postoperative nausea and vomiting)   . Pulmonary embolism ([place]) 6 YRS AGO    Past Surgical History:  Procedure Laterality Date  . ABDOMINAL HYSTERECTOMY    . CATARACT EXTRACTION Bilateral 2015  . COLONOSCOPY    . CYSTOSCOPY W/ URETERAL STENT PLACEMENT Right 05/23/2017   Procedure: CYSTOSCOPY WITH RETROGRADE PYELOGRAM/URETERAL RIGHT STENT PLACEMENT;  Surgeon: Bjorn Loser, MD;  [facility];  Service: Urology;   Laterality: Right;  . CYSTOSCOPY W/ URETERAL STENT PLACEMENT Right 06/04/2017   Procedure: CYSTOSCOPY WITHRIGHT RETROGRADE PYELOGRAMRIGHT /URETEREOSCOPY  STENT PLACEMENT;  Surgeon: Lucas Mallow, MD;  Location: [facility];  Service: Urology;  Laterality: Right;  . ENDOMETRIAL ABLATION    . EYE SURGERY Bilateral    cataract surgery with lens implants  . [place]  2011  . INCONTINENCE SURGERY    . INNER EAR SURGERY     X 2  . KNEE SURGERY    . RECTAL PROLAPSE REPAIR    . RETINAL DETACHMENT SURGERY Bilateral 2000  . RETINAL DETACHMENT SURGERY Bilateral   . TOTAL KNEE ARTHROPLASTY Left 04/29/2019  . TOTAL KNEE ARTHROPLASTY Left 04/29/2019   Procedure: LEFT TOTAL KNEE ARTHROPLASTY;  Surgeon: Mcarthur Rossetti, MD;  Location: [place];  Service: Orthopedics;  Laterality: Left;  . TUBAL LIGATION    . VEIN SURGERY Left 04/2010    There were no vitals filed for this visit.  Subjective Assessment - 09/17/19 1247    Subjective  I return to MD in 3 months. i'm back to going to the pool. My knee prosthesis is tight per my MD.    Pertinent History  right  shoulder impingement, cervicalgia, right Trochanteric bursitis, dyspnea, HTN,    Currently in Pain?  Yes    Pain Score  5     Pain Location  Knee    Pain Orientation  Left    Pain Descriptors / Indicators  Sore    Multiple Pain Sites  No         [facility] PT Assessment - 09/17/19 0001      PROM   Left Knee Flexion  112                   [facility] Adult PT Treatment/Exercise - 09/17/19 0001      Manual Therapy   Soft tissue mobilization  hamstrings, quads    Passive ROM  passive knee flex and ext in prone, CR in prone using both quad and hamstring for CR                  PT Long Term Goals - 09/10/19 1428      PT LONG TERM GOAL #1   Title  PROM Left knee flexion 120 degrees    Baseline  117 (09/10/19)    Status  On-going            Plan - 09/17/19 1246    Clinical  Impression Statement  Pt saw MD, will return in 3 mos. MD reassured pt her knee was not "loose" like she was worried. PROM post session 112 degrees today. Checked quad set today. pt demonstrates slight improvement. PTA suggested she increase her hold time to 10 sec. Currently she is holding 3 sec.    Personal Factors and Comorbidities  Comorbidity 3+;Fitness;Past/Current Experience    Comorbidities  right shoulder impingement, cervicalgia, right Trochanteric bursitis, dyspnea, HTN    Examination-Activity Limitations  Bend;Lift;Locomotion Level;Stand;Stairs;Transfers    Examination-Participation Restrictions  Community Activity    Stability/Clinical Decision Making  Stable/Uncomplicated    Rehab Potential  Excellent    PT Frequency  2x / week    PT Duration  12 weeks    PT Treatment/Interventions  ADLs/Self Care Home Management;Electrical Stimulation;Moist Heat;DME Instruction;Gait training;Stair training;Functional mobility training;Therapeutic activities;Therapeutic exercise;Balance training;Neuromuscular re-education;Patient/family education;Manual techniques;Scar mobilization;Compression bandaging;Passive range of motion;Dry needling;Taping;Vasopneumatic Device;Joint Manipulations    PT Next Visit Plan  re-check quad set ( pt is supposed to work on this- reports she forgot today) Pt is self pay so she would like to focus on stretching and othe manual techniques that she can't do at home    PT [place] and Agree with Plan of Care  Patient       Patient will benefit from skilled therapeutic intervention in order to improve the following deficits and impairments:  Abnormal gait, Decreased activity tolerance, Decreased balance, Decreased endurance, Decreased mobility, Decreased range of motion, Decreased skin integrity, Decreased scar mobility, Decreased strength, Increased edema, Impaired flexibility, Postural dysfunction, Pain  Visit Diagnosis: Other abnormalities  of gait and mobility  Unsteadiness on feet  Localized edema  Chronic pain of left knee  Muscle weakness  Stiffness of left knee, not elsewhere classified  Abnormal posture     Problem List Patient Active Problem List   Diagnosis Date Noted  . Status post total left knee replacement 04/29/2019  . Impingement syndrome of right shoulder 10/28/2018  . Chronic pain of left knee 03/27/2018  . Chronic right shoulder pain 03/27/2018  . Cervicalgia 03/27/2018  . Exposure of implanted vaginal mesh ([place]) 03/06/2018  . Outlet dysfunction constipation  03/06/2018  . Status post arthroscopy of left knee 08/06/2017  . Unilateral primary osteoarthritis, left knee 08/06/2017  . Tremor 07/10/2017  . Other meniscus derangements, posterior horn of medial meniscus, left knee 06/28/2017  . Ureteral stone 05/23/2017  . History of sepsis 05/23/2017  . Acute pain of left knee 04/09/2017  . Trochanteric bursitis, right hip 09/27/2016  . GAD (generalized anxiety disorder) 05/31/2014  . Rectocele 08/21/2011  . Uterovaginal prolapse 08/21/2011  . Dyspnea 10/13/2010  . Hypertension 10/13/2010    COCHRAN,JENNIFER, PTA 09/17/2019, 1:21 PM  [facility] [address], [place], [place], [address] Phone: [phone]   Fax:  [phone]  Name: Sharon [name] MRN: [id number] Date of Birth: 07-08-1965

## 2019-09-22 ENCOUNTER — Ambulatory Visit: Payer: Self-pay | Attending: Orthopaedic Surgery | Admitting: Physical Therapy

## 2019-09-22 ENCOUNTER — Encounter: Payer: Self-pay | Admitting: Physical Therapy

## 2019-09-22 ENCOUNTER — Other Ambulatory Visit: Payer: Self-pay

## 2019-09-22 DIAGNOSIS — R600 Localized edema: Secondary | ICD-10-CM | POA: Insufficient documentation

## 2019-09-22 DIAGNOSIS — R2681 Unsteadiness on feet: Secondary | ICD-10-CM | POA: Insufficient documentation

## 2019-09-22 DIAGNOSIS — G8929 Other chronic pain: Secondary | ICD-10-CM | POA: Insufficient documentation

## 2019-09-22 DIAGNOSIS — M6281 Muscle weakness (generalized): Secondary | ICD-10-CM | POA: Insufficient documentation

## 2019-09-22 DIAGNOSIS — R293 Abnormal posture: Secondary | ICD-10-CM | POA: Insufficient documentation

## 2019-09-22 DIAGNOSIS — R2689 Other abnormalities of gait and mobility: Secondary | ICD-10-CM | POA: Insufficient documentation

## 2019-09-22 DIAGNOSIS — M25562 Pain in left knee: Secondary | ICD-10-CM | POA: Insufficient documentation

## 2019-09-22 DIAGNOSIS — M25662 Stiffness of left knee, not elsewhere classified: Secondary | ICD-10-CM | POA: Insufficient documentation

## 2019-09-22 NOTE — Therapy (Signed)
[facility] [address] [place], [place], [address] Phone: [phone]   Fax:  [phone]  Physical Therapy Treatment  Patient Details  Name: Sharon [name] MRN: [id number] Date of Birth: 29-Sep-1965 Referring Provider (PT): Jean Rosenthal, MD   Encounter Date: 09/22/2019  PT End of Session - 09/22/19 1410    Visit Number  34    Date for PT Re-Evaluation  11/17/19    Authorization Time Period  MET deductible,  100% covered for 2020    Authorization - Visit Number  61    PT Start Time  7001   Pt late   PT Stop Time  1442    PT Time Calculation (min)  33 min    Activity Tolerance  Patient tolerated treatment well    Behavior During Therapy  [facility] for tasks assessed/performed       Past Medical History:  Diagnosis Date  . Allergy   . Anal fissure   . Anemia    borderline  . Anxiety   . Arthritis   . Blood clot in vein    left leg  . Detached retina    BOTH SCLERA BUCKLE BOTH EYES  . DVT (deep venous thrombosis) (HCC)    left leg  . Endometriosis   . Family history of adverse reaction to anesthesia    DAUGHTER POST OP PONV  . GERD (gastroesophageal reflux disease)   . Glaucoma    RIGHT WORST THAN LEFT  . Heart murmur    "caused by anxiety"   . History of hemorrhoids   . History of kidney stones   . Hypertension   . IBS (irritable bowel syndrome)   . Perforated eardrum, right    SMALL HOLE  . PONV (postoperative nausea and vomiting)   . Pulmonary embolism ([place]) 6 YRS AGO    Past Surgical History:  Procedure Laterality Date  . ABDOMINAL HYSTERECTOMY    . CATARACT EXTRACTION Bilateral 2015  . COLONOSCOPY    . CYSTOSCOPY W/ URETERAL STENT PLACEMENT Right 05/23/2017   Procedure: CYSTOSCOPY WITH RETROGRADE PYELOGRAM/URETERAL RIGHT STENT PLACEMENT;  Surgeon: Bjorn Loser, MD;  Location: [facility];  Service: Urology;  Laterality: Right;  . CYSTOSCOPY W/ URETERAL STENT PLACEMENT Right  06/04/2017   Procedure: CYSTOSCOPY WITHRIGHT RETROGRADE PYELOGRAMRIGHT /URETEREOSCOPY  STENT PLACEMENT;  Surgeon: Lucas Mallow, MD;  Location: [facility];  Service: Urology;  Laterality: Right;  . ENDOMETRIAL ABLATION    . EYE SURGERY Bilateral    cataract surgery with lens implants  . [place]  2011  . INCONTINENCE SURGERY    . INNER EAR SURGERY     X 2  . KNEE SURGERY    . RECTAL PROLAPSE REPAIR    . RETINAL DETACHMENT SURGERY Bilateral 2000  . RETINAL DETACHMENT SURGERY Bilateral   . TOTAL KNEE ARTHROPLASTY Left 04/29/2019  . TOTAL KNEE ARTHROPLASTY Left 04/29/2019   Procedure: LEFT TOTAL KNEE ARTHROPLASTY;  Surgeon: Mcarthur Rossetti, MD;  Location: [place];  Service: Orthopedics;  Laterality: Left;  . TUBAL LIGATION    . VEIN SURGERY Left 04/2010    There were no vitals filed for this visit.  Subjective Assessment - 09/22/19 1411    Subjective  I have been to the pool 2x since my last. I had two good pops in my knee while in the pool.    Currently in Pain?  Yes    Pain Score  5     Pain  Location  Knee    Pain Orientation  Left    Pain Descriptors / Indicators  Tightness         [facility] PT Assessment - 09/22/19 0001      PROM   Left Knee Flexion  120                   [facility] Adult PT Treatment/Exercise - 09/22/19 0001      Manual Therapy   Soft tissue mobilization  hamstrings, quads, around the fibular head    Passive ROM  passive knee flex and ext in prone, CR in prone using both quad and hamstring for CR                  PT Long Term Goals - 09/10/19 1428      PT LONG TERM GOAL #1   Title  PROM Left knee flexion 120 degrees    Baseline  117 (09/10/19)    Status  On-going            Plan - 09/22/19 1412    Clinical Impression Statement  Pt arrives with mostly tightness versus pain. She has been to the pool 2x since last visit. She is working on improving her quad set at home. PROM post session  120 degrees.    Personal Factors and Comorbidities  Comorbidity 3+;Fitness;Past/Current Experience    Comorbidities  right shoulder impingement, cervicalgia, right Trochanteric bursitis, dyspnea, HTN    Examination-Activity Limitations  Bend;Lift;Locomotion Level;Stand;Stairs;Transfers    Stability/Clinical Decision Making  Stable/Uncomplicated    Rehab Potential  Excellent    PT Frequency  2x / week    PT Duration  12 weeks    PT Treatment/Interventions  ADLs/Self Care Home Management;Electrical Stimulation;Moist Heat;DME Instruction;Gait training;Stair training;Functional mobility training;Therapeutic activities;Therapeutic exercise;Balance training;Neuromuscular re-education;Patient/family education;Manual techniques;Scar mobilization;Compression bandaging;Passive range of motion;Dry needling;Taping;Vasopneumatic Device;Joint Manipulations    PT Next Visit Plan  re-check quad set ( pt is supposed to work on this- reports she forgot today) Pt is self pay so she would like to focus on stretching and othe manual techniques that she can't do at home    PT [place] and Agree with Plan of Care  Patient       Patient will benefit from skilled therapeutic intervention in order to improve the following deficits and impairments:  Abnormal gait, Decreased activity tolerance, Decreased balance, Decreased endurance, Decreased mobility, Decreased range of motion, Decreased skin integrity, Decreased scar mobility, Decreased strength, Increased edema, Impaired flexibility, Postural dysfunction, Pain  Visit Diagnosis: Other abnormalities of gait and mobility  Unsteadiness on feet  Localized edema  Chronic pain of left knee     Problem List Patient Active Problem List   Diagnosis Date Noted  . Status post total left knee replacement 04/29/2019  . Impingement syndrome of right shoulder 10/28/2018  . Chronic pain of left knee 03/27/2018  . Chronic right shoulder  pain 03/27/2018  . Cervicalgia 03/27/2018  . Exposure of implanted vaginal mesh ([place]) 03/06/2018  . Outlet dysfunction constipation 03/06/2018  . Status post arthroscopy of left knee 08/06/2017  . Unilateral primary osteoarthritis, left knee 08/06/2017  . Tremor 07/10/2017  . Other meniscus derangements, posterior horn of medial meniscus, left knee 06/28/2017  . Ureteral stone 05/23/2017  . History of sepsis 05/23/2017  . Acute pain of left knee 04/09/2017  . Trochanteric bursitis, right hip 09/27/2016  . GAD (generalized anxiety disorder) 05/31/2014  . Rectocele 08/21/2011  .  Uterovaginal prolapse 08/21/2011  . Dyspnea 10/13/2010  . Hypertension 10/13/2010    Cecille Mcclusky, PTA 09/22/2019, 2:43 PM  [facility] [address], [place], [place], [address] Phone: [phone]   Fax:  [phone]  Name: Sharon [name] MRN: [id number] Date of Birth: 05-02-1966

## 2019-09-23 ENCOUNTER — Telehealth: Payer: Self-pay | Admitting: Orthopaedic Surgery

## 2019-09-23 NOTE — Telephone Encounter (Signed)
faxed

## 2019-09-23 NOTE — Telephone Encounter (Signed)
Patent requesting a note to dentist no antibiotic req for cleaning and exam.  Please fax to Dr. Randol Kern [phone]

## 2019-09-24 ENCOUNTER — Encounter: Payer: Self-pay | Admitting: Physical Therapy

## 2019-09-24 ENCOUNTER — Telehealth: Payer: Self-pay | Admitting: Orthopaedic Surgery

## 2019-09-24 ENCOUNTER — Ambulatory Visit: Payer: Self-pay | Admitting: Physical Therapy

## 2019-09-24 ENCOUNTER — Other Ambulatory Visit: Payer: Self-pay

## 2019-09-24 DIAGNOSIS — M6281 Muscle weakness (generalized): Secondary | ICD-10-CM

## 2019-09-24 DIAGNOSIS — R2689 Other abnormalities of gait and mobility: Secondary | ICD-10-CM

## 2019-09-24 DIAGNOSIS — G8929 Other chronic pain: Secondary | ICD-10-CM

## 2019-09-24 DIAGNOSIS — R600 Localized edema: Secondary | ICD-10-CM

## 2019-09-24 DIAGNOSIS — R2681 Unsteadiness on feet: Secondary | ICD-10-CM

## 2019-09-24 DIAGNOSIS — M25562 Pain in left knee: Secondary | ICD-10-CM

## 2019-09-24 NOTE — Telephone Encounter (Signed)
Re-faxed.

## 2019-09-24 NOTE — Telephone Encounter (Signed)
Patient called. Says her dentist did not receive the fax yesterday. Would like Caryl Pina to refax the note.   Patent requesting a note to dentist no antibiotic req for cleaning and exam.  Please fax to Dr. Randol Kern [phone]

## 2019-09-24 NOTE — Therapy (Signed)
[facility] [address] [place], [place], [address] Phone: [phone]   Fax:  [phone]  Physical Therapy Treatment  Patient Details  Name: Sharon [name] MRN: [id number] Date of Birth: 03/06/1966 Referring Provider (PT): Jean Rosenthal, MD   Encounter Date: 09/24/2019  PT End of Session - 09/24/19 1401    Visit Number  35    Date for PT Re-Evaluation  11/17/19    Authorization Type  [employer] - 30 visits beginning 2021 - track visits below    Authorization Time Period  MET deductible,  100% covered for 2020    PT Start Time  1400    PT Stop Time  1442    PT Time Calculation (min)  42 min    Activity Tolerance  Patient tolerated treatment well    Behavior During Therapy  [facility] for tasks assessed/performed       Past Medical History:  Diagnosis Date  . Allergy   . Anal fissure   . Anemia    borderline  . Anxiety   . Arthritis   . Blood clot in vein    left leg  . Detached retina    BOTH SCLERA BUCKLE BOTH EYES  . DVT (deep venous thrombosis) (HCC)    left leg  . Endometriosis   . Family history of adverse reaction to anesthesia    DAUGHTER POST OP PONV  . GERD (gastroesophageal reflux disease)   . Glaucoma    RIGHT WORST THAN LEFT  . Heart murmur    "caused by anxiety"   . History of hemorrhoids   . History of kidney stones   . Hypertension   . IBS (irritable bowel syndrome)   . Perforated eardrum, right    SMALL HOLE  . PONV (postoperative nausea and vomiting)   . Pulmonary embolism ([place]) 6 YRS AGO    Past Surgical History:  Procedure Laterality Date  . ABDOMINAL HYSTERECTOMY    . CATARACT EXTRACTION Bilateral 2015  . COLONOSCOPY    . CYSTOSCOPY W/ URETERAL STENT PLACEMENT Right 05/23/2017   Procedure: CYSTOSCOPY WITH RETROGRADE PYELOGRAM/URETERAL RIGHT STENT PLACEMENT;  Surgeon: Bjorn Loser, MD;  [facility];  Service: Urology;  Laterality: Right;  . CYSTOSCOPY W/  URETERAL STENT PLACEMENT Right 06/04/2017   Procedure: CYSTOSCOPY WITHRIGHT RETROGRADE PYELOGRAMRIGHT /URETEREOSCOPY  STENT PLACEMENT;  Surgeon: Lucas Mallow, MD;  Location: [facility];  Service: Urology;  Laterality: Right;  . ENDOMETRIAL ABLATION    . EYE SURGERY Bilateral    cataract surgery with lens implants  . [place]  2011  . INCONTINENCE SURGERY    . INNER EAR SURGERY     X 2  . KNEE SURGERY    . RECTAL PROLAPSE REPAIR    . RETINAL DETACHMENT SURGERY Bilateral 2000  . RETINAL DETACHMENT SURGERY Bilateral   . TOTAL KNEE ARTHROPLASTY Left 04/29/2019  . TOTAL KNEE ARTHROPLASTY Left 04/29/2019   Procedure: LEFT TOTAL KNEE ARTHROPLASTY;  Surgeon: Mcarthur Rossetti, MD;  Location: [place];  Service: Orthopedics;  Laterality: Left;  . TUBAL LIGATION    . VEIN SURGERY Left 04/2010    There were no vitals filed for this visit.  Subjective Assessment - 09/24/19 1403    Subjective  I am doing not too bad today.    Pertinent History  right shoulder impingement, cervicalgia, right Trochanteric bursitis, dyspnea, HTN,    Currently in Pain?  Yes    Pain Score  5  Pain Location  Knee    Pain Orientation  Left    Multiple Pain Sites  No         [facility] PT Assessment - 09/24/19 0001      PROM   Left Knee Flexion  120                   [facility] Adult PT Treatment/Exercise - 09/24/19 0001      Knee/Hip Exercises: Aerobic   Stationary Bike  3 min L1, VC to keep heel down      Manual Therapy   Soft tissue mobilization  hamstrings, quads, distal gastroc, and  around the fibular head    Passive ROM  passive knee flex and ext in sitting and prone, CR in prone using both quad and hamstring for CR                  PT Long Term Goals - 09/10/19 1428      PT LONG TERM GOAL #1   Title  PROM Left knee flexion 120 degrees    Baseline  117 (09/10/19)    Status  On-going            Plan - 09/24/19 1403    Clinical  Impression Statement  Pt was able to maintain 120 degrees PROM. Quad set still poor. PTA verbally cued pt to contarct quad during stance phase more.    Personal Factors and Comorbidities  Comorbidity 3+;Fitness;Past/Current Experience    Comorbidities  right shoulder impingement, cervicalgia, right Trochanteric bursitis, dyspnea, HTN    Examination-Activity Limitations  Bend;Lift;Locomotion Level;Stand;Stairs;Transfers    Stability/Clinical Decision Making  Stable/Uncomplicated    Rehab Potential  Excellent    PT Frequency  2x / week    PT Duration  12 weeks    PT Treatment/Interventions  ADLs/Self Care Home Management;Electrical Stimulation;Moist Heat;DME Instruction;Gait training;Stair training;Functional mobility training;Therapeutic activities;Therapeutic exercise;Balance training;Neuromuscular re-education;Patient/family education;Manual techniques;Scar mobilization;Compression bandaging;Passive range of motion;Dry needling;Taping;Vasopneumatic Device;Joint Manipulations    PT Next Visit Plan  re-check quad set ( pt is supposed to work on this- reports she forgot today) Pt is self pay so she would like to focus on stretching and othe manual techniques that she can't do at home    PT [place] and Agree with Plan of Care  Patient       Patient will benefit from skilled therapeutic intervention in order to improve the following deficits and impairments:  Abnormal gait, Decreased activity tolerance, Decreased balance, Decreased endurance, Decreased mobility, Decreased range of motion, Decreased skin integrity, Decreased scar mobility, Decreased strength, Increased edema, Impaired flexibility, Postural dysfunction, Pain  Visit Diagnosis: Other abnormalities of gait and mobility  Unsteadiness on feet  Localized edema  Chronic pain of left knee  Muscle weakness     Problem List Patient Active Problem List   Diagnosis Date Noted  . Status post total  left knee replacement 04/29/2019  . Impingement syndrome of right shoulder 10/28/2018  . Chronic pain of left knee 03/27/2018  . Chronic right shoulder pain 03/27/2018  . Cervicalgia 03/27/2018  . Exposure of implanted vaginal mesh ([place]) 03/06/2018  . Outlet dysfunction constipation 03/06/2018  . Status post arthroscopy of left knee 08/06/2017  . Unilateral primary osteoarthritis, left knee 08/06/2017  . Tremor 07/10/2017  . Other meniscus derangements, posterior horn of medial meniscus, left knee 06/28/2017  . Ureteral stone 05/23/2017  . History of sepsis 05/23/2017  . Acute pain of  left knee 04/09/2017  . Trochanteric bursitis, right hip 09/27/2016  . GAD (generalized anxiety disorder) 05/31/2014  . Rectocele 08/21/2011  . Uterovaginal prolapse 08/21/2011  . Dyspnea 10/13/2010  . Hypertension 10/13/2010    COCHRAN,JENNIFER, PTA 09/24/2019, 2:46 PM   [facility] [address] [place], [place], [address] Phone: [phone]   Fax:  [phone]  Name: Samarrah Tranchina MRN: [id number] Date of Birth: January 20, 1966

## 2019-09-29 ENCOUNTER — Ambulatory Visit: Payer: Self-pay | Admitting: Physical Therapy

## 2019-09-29 ENCOUNTER — Other Ambulatory Visit: Payer: Self-pay

## 2019-09-29 DIAGNOSIS — G8929 Other chronic pain: Secondary | ICD-10-CM

## 2019-09-29 DIAGNOSIS — R2681 Unsteadiness on feet: Secondary | ICD-10-CM

## 2019-09-29 DIAGNOSIS — R293 Abnormal posture: Secondary | ICD-10-CM

## 2019-09-29 DIAGNOSIS — R2689 Other abnormalities of gait and mobility: Secondary | ICD-10-CM

## 2019-09-29 DIAGNOSIS — M25662 Stiffness of left knee, not elsewhere classified: Secondary | ICD-10-CM

## 2019-09-29 NOTE — Therapy (Signed)
[facility] [address] [place], [place], [address] Phone: [phone]   Fax:  [phone]  Physical Therapy Treatment  Patient Details  Name: Sharon [name] MRN: [id number] Date of Birth: 02-10-1966 Referring Provider (PT): Jean Rosenthal, MD   Encounter Date: 09/29/2019  PT End of Session - 09/29/19 1443    Visit Number  80    Date for PT Re-Evaluation  11/17/19    Authorization Type  [employer] - 30 visits beginning 2021 - track visits below    Authorization Time Period  MET deductible,  100% covered for 2020    PT Start Time  1404    PT Stop Time  1448    PT Time Calculation (min)  44 min    Activity Tolerance  Patient tolerated treatment well    Behavior During Therapy  [facility] for tasks assessed/performed       Past Medical History:  Diagnosis Date  . Allergy   . Anal fissure   . Anemia    borderline  . Anxiety   . Arthritis   . Blood clot in vein    left leg  . Detached retina    BOTH SCLERA BUCKLE BOTH EYES  . DVT (deep venous thrombosis) (HCC)    left leg  . Endometriosis   . Family history of adverse reaction to anesthesia    DAUGHTER POST OP PONV  . GERD (gastroesophageal reflux disease)   . Glaucoma    RIGHT WORST THAN LEFT  . Heart murmur    "caused by anxiety"   . History of hemorrhoids   . History of kidney stones   . Hypertension   . IBS (irritable bowel syndrome)   . Perforated eardrum, right    SMALL HOLE  . PONV (postoperative nausea and vomiting)   . Pulmonary embolism ([place]) 6 YRS AGO    Past Surgical History:  Procedure Laterality Date  . ABDOMINAL HYSTERECTOMY    . CATARACT EXTRACTION Bilateral 2015  . COLONOSCOPY    . CYSTOSCOPY W/ URETERAL STENT PLACEMENT Right 05/23/2017   Procedure: CYSTOSCOPY WITH RETROGRADE PYELOGRAM/URETERAL RIGHT STENT PLACEMENT;  Surgeon: Bjorn Loser, MD;  [facility];  Service: Urology;  Laterality: Right;  . CYSTOSCOPY W/  URETERAL STENT PLACEMENT Right 06/04/2017   Procedure: CYSTOSCOPY WITHRIGHT RETROGRADE PYELOGRAMRIGHT /URETEREOSCOPY  STENT PLACEMENT;  Surgeon: Lucas Mallow, MD;  Location: [facility];  Service: Urology;  Laterality: Right;  . ENDOMETRIAL ABLATION    . EYE SURGERY Bilateral    cataract surgery with lens implants  . [place]  2011  . INCONTINENCE SURGERY    . INNER EAR SURGERY     X 2  . KNEE SURGERY    . RECTAL PROLAPSE REPAIR    . RETINAL DETACHMENT SURGERY Bilateral 2000  . RETINAL DETACHMENT SURGERY Bilateral   . TOTAL KNEE ARTHROPLASTY Left 04/29/2019  . TOTAL KNEE ARTHROPLASTY Left 04/29/2019   Procedure: LEFT TOTAL KNEE ARTHROPLASTY;  Surgeon: Mcarthur Rossetti, MD;  Location: [place];  Service: Orthopedics;  Laterality: Left;  . TUBAL LIGATION    . VEIN SURGERY Left 04/2010    There were no vitals filed for this visit.  Subjective Assessment - 09/29/19 1406    Subjective  I hurt a lot after last session. i did go to the pool on Friday.    Pertinent History  right shoulder impingement, cervicalgia, right Trochanteric bursitis, dyspnea, HTN,    Currently in Pain?  Yes  Pain Score  7     Pain Location  Knee    Pain Orientation  Left    Pain Descriptors / Indicators  Sore    Aggravating Factors   Sometimes bending it in PT    Pain Relieving Factors  ice, stretching                       [facility] Adult PT Treatment/Exercise - 09/29/19 0001      Knee/Hip Exercises: Aerobic   Recumbent Bike  L3 x 5 min before going prone      Knee/Hip Exercises: Seated   Long Arc Quad  AROM;Strengthening;Left;2 sets;10 reps    Long Arc Quad Limitations  Improving quad set: VC to try with ankle dorsiflexed at home.       Manual Therapy   Soft tissue mobilization  hamstrings, quads, distal gastroc, and  around the fibular head    Passive ROM  passive knee flex and ext in sitting and prone, CR in prone using both quad and hamstring for CR:  gd 3 post hip mobs                  PT Long Term Goals - 09/10/19 1428      PT LONG TERM GOAL #1   Title  PROM Left knee flexion 120 degrees    Baseline  117 (09/10/19)    Status  On-going            Plan - 09/29/19 1424    Clinical Impression Statement  Pt had increased pain after our last sesssion. She continues to work on her quad strength at home. She demonstrates the best LAQ to date today. we continue to use a variety of manual techniques to increase her LT knee AROM. Prior to going prone pt's AROM was 105 degrees. PROM post prone stretching 120 degrees.    Personal Factors and Comorbidities  Comorbidity 3+;Fitness;Past/Current Experience    Comorbidities  right shoulder impingement, cervicalgia, right Trochanteric bursitis, dyspnea, HTN    Examination-Activity Limitations  Bend;Lift;Locomotion Level;Stand;Stairs;Transfers    Stability/Clinical Decision Making  Stable/Uncomplicated    Rehab Potential  Excellent    PT Frequency  2x / week    PT Duration  12 weeks    PT Treatment/Interventions  ADLs/Self Care Home Management;Electrical Stimulation;Moist Heat;DME Instruction;Gait training;Stair training;Functional mobility training;Therapeutic activities;Therapeutic exercise;Balance training;Neuromuscular re-education;Patient/family education;Manual techniques;Scar mobilization;Compression bandaging;Passive range of motion;Dry needling;Taping;Vasopneumatic Device;Joint Manipulations    PT Next Visit Plan  Update any HEP for quad strength, continue with manual techniques for increased knee ROM    PT Home Exercise Plan  [facility]       Patient will benefit from skilled therapeutic intervention in order to improve the following deficits and impairments:  Abnormal gait, Decreased activity tolerance, Decreased balance, Decreased endurance, Decreased mobility, Decreased range of motion, Decreased skin integrity, Decreased scar mobility, Decreased strength, Increased edema,  Impaired flexibility, Postural dysfunction, Pain  Visit Diagnosis: Other abnormalities of gait and mobility  Unsteadiness on feet  Chronic pain of left knee  Stiffness of left knee, not elsewhere classified  Abnormal posture     Problem List Patient Active Problem List   Diagnosis Date Noted  . Status post total left knee replacement 04/29/2019  . Impingement syndrome of right shoulder 10/28/2018  . Chronic pain of left knee 03/27/2018  . Chronic right shoulder pain 03/27/2018  . Cervicalgia 03/27/2018  . Exposure of implanted vaginal mesh ([place]) 03/06/2018  . Outlet dysfunction constipation  03/06/2018  . Status post arthroscopy of left knee 08/06/2017  . Unilateral primary osteoarthritis, left knee 08/06/2017  . Tremor 07/10/2017  . Other meniscus derangements, posterior horn of medial meniscus, left knee 06/28/2017  . Ureteral stone 05/23/2017  . History of sepsis 05/23/2017  . Acute pain of left knee 04/09/2017  . Trochanteric bursitis, right hip 09/27/2016  . GAD (generalized anxiety disorder) 05/31/2014  . Rectocele 08/21/2011  . Uterovaginal prolapse 08/21/2011  . Dyspnea 10/13/2010  . Hypertension 10/13/2010    Cheral Cappucci, PTA 09/29/2019, 2:45 PM  [facility] [address] [place], [place], [address] Phone: [phone]   Fax:  [phone]  Name: Ajah Vanhoose MRN: [id number] Date of Birth: July 08, 1965

## 2019-10-01 ENCOUNTER — Other Ambulatory Visit: Payer: Self-pay

## 2019-10-01 ENCOUNTER — Encounter: Payer: Self-pay | Admitting: Physical Therapy

## 2019-10-01 ENCOUNTER — Ambulatory Visit: Payer: Self-pay | Admitting: Physical Therapy

## 2019-10-01 DIAGNOSIS — R600 Localized edema: Secondary | ICD-10-CM

## 2019-10-01 DIAGNOSIS — M25662 Stiffness of left knee, not elsewhere classified: Secondary | ICD-10-CM

## 2019-10-01 DIAGNOSIS — R2681 Unsteadiness on feet: Secondary | ICD-10-CM

## 2019-10-01 DIAGNOSIS — R2689 Other abnormalities of gait and mobility: Secondary | ICD-10-CM

## 2019-10-01 DIAGNOSIS — G8929 Other chronic pain: Secondary | ICD-10-CM

## 2019-10-01 DIAGNOSIS — M6281 Muscle weakness (generalized): Secondary | ICD-10-CM

## 2019-10-01 DIAGNOSIS — R293 Abnormal posture: Secondary | ICD-10-CM

## 2019-10-01 NOTE — Therapy (Signed)
[facility] [address] [place], [place], [address] Phone: [phone]   Fax:  [phone]  Physical Therapy Treatment  Patient Details  Name: Sharon [name] MRN: [id number] Date of Birth: Jun 07, 1966 Referring Provider (PT): Jean Rosenthal, MD   Encounter Date: 10/01/2019  PT End of Session - 10/01/19 1448    Visit Number  37    Date for PT Re-Evaluation  11/17/19    Authorization Type  [employer] - 30 visits beginning 2021 - track visits below    Authorization Time Period  MET deductible,  100% covered for 2020    PT Start Time  1400    PT Stop Time  1442    PT Time Calculation (min)  42 min    Activity Tolerance  Patient tolerated treatment well    Behavior During Therapy  [facility] for tasks assessed/performed       Past Medical History:  Diagnosis Date  . Allergy   . Anal fissure   . Anemia    borderline  . Anxiety   . Arthritis   . Blood clot in vein    left leg  . Detached retina    BOTH SCLERA BUCKLE BOTH EYES  . DVT (deep venous thrombosis) (HCC)    left leg  . Endometriosis   . Family history of adverse reaction to anesthesia    DAUGHTER POST OP PONV  . GERD (gastroesophageal reflux disease)   . Glaucoma    RIGHT WORST THAN LEFT  . Heart murmur    "caused by anxiety"   . History of hemorrhoids   . History of kidney stones   . Hypertension   . IBS (irritable bowel syndrome)   . Perforated eardrum, right    SMALL HOLE  . PONV (postoperative nausea and vomiting)   . Pulmonary embolism ([place]) 6 YRS AGO    Past Surgical History:  Procedure Laterality Date  . ABDOMINAL HYSTERECTOMY    . CATARACT EXTRACTION Bilateral 2015  . COLONOSCOPY    . CYSTOSCOPY W/ URETERAL STENT PLACEMENT Right 05/23/2017   Procedure: CYSTOSCOPY WITH RETROGRADE PYELOGRAM/URETERAL RIGHT STENT PLACEMENT;  Surgeon: Bjorn Loser, MD;  Location: [facility];  Service: Urology;  Laterality: Right;  . CYSTOSCOPY W/  URETERAL STENT PLACEMENT Right 06/04/2017   Procedure: CYSTOSCOPY WITHRIGHT RETROGRADE PYELOGRAMRIGHT /URETEREOSCOPY  STENT PLACEMENT;  Surgeon: Lucas Mallow, MD;  Location: [facility];  Service: Urology;  Laterality: Right;  . ENDOMETRIAL ABLATION    . EYE SURGERY Bilateral    cataract surgery with lens implants  . [place]  2011  . INCONTINENCE SURGERY    . INNER EAR SURGERY     X 2  . KNEE SURGERY    . RECTAL PROLAPSE REPAIR    . RETINAL DETACHMENT SURGERY Bilateral 2000  . RETINAL DETACHMENT SURGERY Bilateral   . TOTAL KNEE ARTHROPLASTY Left 04/29/2019  . TOTAL KNEE ARTHROPLASTY Left 04/29/2019   Procedure: LEFT TOTAL KNEE ARTHROPLASTY;  Surgeon: Mcarthur Rossetti, MD;  Location: [place];  Service: Orthopedics;  Laterality: Left;  . TUBAL LIGATION    . VEIN SURGERY Left 04/2010    There were no vitals filed for this visit.  Subjective Assessment - 10/01/19 1447    Pertinent History  right shoulder impingement, cervicalgia, right Trochanteric bursitis, dyspnea, HTN,    Patient Stated Goals  To walk without cane or pain.    Currently in Pain?  Yes    Pain Score  6  Pain Location  Knee    Pain Orientation  Left    Pain Descriptors / Indicators  Burning    Pain Type  Chronic pain    Aggravating Factors   bending last time    Multiple Pain Sites  No                       [facility] Adult PT Treatment/Exercise - 10/01/19 0001      Knee/Hip Exercises: Aerobic   Stationary Bike  3 min L1, VC to keep heel down    Recumbent Bike  L3 x 5 min before going prone      Manual Therapy   Soft tissue mobilization  hamstrings, quads, distal gastroc, and  around the fibular head    Passive ROM  passive knee flex and ext in sitting and prone, CR in prone using both quad and hamstring for CR: gd 3 post hip mobs                  PT Long Term Goals - 09/10/19 1428      PT LONG TERM GOAL #1   Title  PROM Left knee flexion 120  degrees    Baseline  117 (09/10/19)    Status  On-going            Plan - 10/01/19 1450    Clinical Impression Statement  Pt continues to have increased pain with PROM.  States the pain is down in the lower leg more than before.  Lower leg does not feel warm and no pain with passive ankle dorsiflexion so no signs for blood clots were noted.  Pt was able to get to 116 deg of PROM knee flexion on her own using the stretch strap at the end of the session.  Her maximum ROM today was with PT assist to get her to 118 deg of knee flexion in hooklying.  Her pain level was elevated and did not tolerate more than that today    Personal Factors and Comorbidities  Profession    PT Treatment/Interventions  ADLs/Self Care Home Management;Electrical Stimulation;Moist Heat;DME Instruction;Gait training;Stair training;Functional mobility training;Therapeutic activities;Therapeutic exercise;Balance training;Neuromuscular re-education;Patient/family education;Manual techniques;Scar mobilization;Compression bandaging;Passive range of motion;Dry needling;Taping;Vasopneumatic Device;Joint Manipulations    PT Next Visit Plan  Update any HEP for quad strength, continue with manual techniques for increased knee ROM    PT Home Exercise Plan  [facility]    Consulted and Agree with Plan of Care  Patient       Patient will benefit from skilled therapeutic intervention in order to improve the following deficits and impairments:  Abnormal gait, Decreased activity tolerance, Decreased balance, Decreased endurance, Decreased mobility, Decreased range of motion, Decreased skin integrity, Decreased scar mobility, Decreased strength, Increased edema, Impaired flexibility, Postural dysfunction, Pain  Visit Diagnosis: Other abnormalities of gait and mobility  Unsteadiness on feet  Chronic pain of left knee  Stiffness of left knee, not elsewhere classified  Abnormal posture  Localized edema  Muscle  weakness     Problem List Patient Active Problem List   Diagnosis Date Noted  . Status post total left knee replacement 04/29/2019  . Impingement syndrome of right shoulder 10/28/2018  . Chronic pain of left knee 03/27/2018  . Chronic right shoulder pain 03/27/2018  . Cervicalgia 03/27/2018  . Exposure of implanted vaginal mesh ([place]) 03/06/2018  . Outlet dysfunction constipation 03/06/2018  . Status post arthroscopy of left knee 08/06/2017  . Unilateral primary osteoarthritis, left knee  08/06/2017  . Tremor 07/10/2017  . Other meniscus derangements, posterior horn of medial meniscus, left knee 06/28/2017  . Ureteral stone 05/23/2017  . History of sepsis 05/23/2017  . Acute pain of left knee 04/09/2017  . Trochanteric bursitis, right hip 09/27/2016  . GAD (generalized anxiety disorder) 05/31/2014  . Rectocele 08/21/2011  . Uterovaginal prolapse 08/21/2011  . Dyspnea 10/13/2010  . Hypertension 10/13/2010    Sharon [name], PT 10/01/2019, 2:58 PM  [facility] [address], [place], [place], [address] Phone: [phone]   Fax:  [phone]  Name: Sharon [name] MRN: [id number] Date of Birth: November 24, 1965

## 2019-10-06 ENCOUNTER — Telehealth: Payer: Self-pay | Admitting: Family Medicine

## 2019-10-06 DIAGNOSIS — F411 Generalized anxiety disorder: Secondary | ICD-10-CM

## 2019-10-06 NOTE — Telephone Encounter (Signed)
Pt called and is stating she needs a refill on her   clonazePAM (KLONOPIN) 0.5 MG tablet [id number]   Medication. Pt had an appt on 06/09/19. Per patient this appt was for all her medication refills. [phone]  Please advise.

## 2019-10-06 NOTE — Telephone Encounter (Signed)
Pt called back to inform pcp that she has 15-20 tablets left and wants to if her request will be denied without an appt because she can not lapse on her prescription. Please advise

## 2019-10-07 ENCOUNTER — Ambulatory Visit: Payer: Self-pay | Admitting: Physical Therapy

## 2019-10-07 ENCOUNTER — Encounter: Payer: Self-pay | Admitting: Physical Therapy

## 2019-10-07 ENCOUNTER — Other Ambulatory Visit: Payer: Self-pay

## 2019-10-07 DIAGNOSIS — M25562 Pain in left knee: Secondary | ICD-10-CM

## 2019-10-07 DIAGNOSIS — M6281 Muscle weakness (generalized): Secondary | ICD-10-CM

## 2019-10-07 DIAGNOSIS — R2681 Unsteadiness on feet: Secondary | ICD-10-CM

## 2019-10-07 DIAGNOSIS — R293 Abnormal posture: Secondary | ICD-10-CM

## 2019-10-07 DIAGNOSIS — R600 Localized edema: Secondary | ICD-10-CM

## 2019-10-07 DIAGNOSIS — R2689 Other abnormalities of gait and mobility: Secondary | ICD-10-CM

## 2019-10-07 DIAGNOSIS — G8929 Other chronic pain: Secondary | ICD-10-CM

## 2019-10-07 DIAGNOSIS — M25662 Stiffness of left knee, not elsewhere classified: Secondary | ICD-10-CM

## 2019-10-07 NOTE — Telephone Encounter (Signed)
Patient is requesting a refill of the following medications: Requested Prescriptions    No prescriptions requested or ordered in this encounter    Date of patient request: 10/06/19 Last office visit: 06/09/19 Date of last refill:06/09/2019 Last refill amount: 30 with 3 Follow up time period per chart: n/a

## 2019-10-07 NOTE — Therapy (Signed)
[facility] [address] [place], [place], [address] Phone: [phone]   Fax:  [phone]  Physical Therapy Treatment  Patient Details  Name: Sharon [name] MRN: [id number] Date of Birth: Aug 28, 1965 Referring Provider (PT): Jean Rosenthal, MD   Encounter Date: 10/07/2019  PT End of Session - 10/07/19 1536    Visit Number  38    Date for PT Re-Evaluation  11/17/19    Authorization Type  [employer] - 30 visits beginning 2021 - track visits below    PT Start Time  1535    PT Stop Time  1615    PT Time Calculation (min)  40 min    Activity Tolerance  Patient tolerated treatment well    Behavior During Therapy  [facility] for tasks assessed/performed       Past Medical History:  Diagnosis Date  . Allergy   . Anal fissure   . Anemia    borderline  . Anxiety   . Arthritis   . Blood clot in vein    left leg  . Detached retina    BOTH SCLERA BUCKLE BOTH EYES  . DVT (deep venous thrombosis) (HCC)    left leg  . Endometriosis   . Family history of adverse reaction to anesthesia    DAUGHTER POST OP PONV  . GERD (gastroesophageal reflux disease)   . Glaucoma    RIGHT WORST THAN LEFT  . Heart murmur    "caused by anxiety"   . History of hemorrhoids   . History of kidney stones   . Hypertension   . IBS (irritable bowel syndrome)   . Perforated eardrum, right    SMALL HOLE  . PONV (postoperative nausea and vomiting)   . Pulmonary embolism ([place]) 6 YRS AGO    Past Surgical History:  Procedure Laterality Date  . ABDOMINAL HYSTERECTOMY    . CATARACT EXTRACTION Bilateral 2015  . COLONOSCOPY    . CYSTOSCOPY W/ URETERAL STENT PLACEMENT Right 05/23/2017   Procedure: CYSTOSCOPY WITH RETROGRADE PYELOGRAM/URETERAL RIGHT STENT PLACEMENT;  Surgeon: Bjorn Loser, MD;  Location: [facility];  Service: Urology;  Laterality: Right;  . CYSTOSCOPY W/ URETERAL STENT PLACEMENT Right 06/04/2017   Procedure: CYSTOSCOPY  WITHRIGHT RETROGRADE PYELOGRAMRIGHT /URETEREOSCOPY  STENT PLACEMENT;  Surgeon: Lucas Mallow, MD;  Location: [facility];  Service: Urology;  Laterality: Right;  . ENDOMETRIAL ABLATION    . EYE SURGERY Bilateral    cataract surgery with lens implants  . [place]  2011  . INCONTINENCE SURGERY    . INNER EAR SURGERY     X 2  . KNEE SURGERY    . RECTAL PROLAPSE REPAIR    . RETINAL DETACHMENT SURGERY Bilateral 2000  . RETINAL DETACHMENT SURGERY Bilateral   . TOTAL KNEE ARTHROPLASTY Left 04/29/2019  . TOTAL KNEE ARTHROPLASTY Left 04/29/2019   Procedure: LEFT TOTAL KNEE ARTHROPLASTY;  Surgeon: Mcarthur Rossetti, MD;  Location: [place];  Service: Orthopedics;  Laterality: Left;  . TUBAL LIGATION    . VEIN SURGERY Left 04/2010    There were no vitals filed for this visit.  Subjective Assessment - 10/07/19 1539    Subjective  I feel like I am not getting sleep.  I have been walking more but it is harder to bend.    Patient Stated Goals  To walk without cane or pain.    Currently in Pain?  Yes    Pain Score  8  Pain Location  Knee    Pain Orientation  Left    Pain Descriptors / Indicators  Burning    Pain Type  Chronic pain    Pain Onset  More than a month ago    Pain Frequency  Intermittent    Aggravating Factors   feeling like something is rubbing to come out the side    Pain Relieving Factors  ice    Multiple Pain Sites  No         [facility] PT Assessment - 10/07/19 0001      PROM   Left Knee Flexion  117                   [facility] Adult PT Treatment/Exercise - 10/07/19 0001      Knee/Hip Exercises: Aerobic   Nustep  L3 x 6 min PT present to assess      Manual Therapy   Soft tissue mobilization  hamstrings, quads, distal gastroc, and  around the fibular head    Passive ROM  passive knee flex and ext in sitting and prone, CR in sitting and prone using both quad and hamstring for CR                  PT Long Term Goals  - 09/10/19 1428      PT LONG TERM GOAL #1   Title  PROM Left knee flexion 120 degrees    Baseline  117 (09/10/19)    Status  On-going            Plan - 10/07/19 1719    Clinical Impression Statement  Pt getting imporved quad activation in all available ROM; She has a lot of hip hike and lumbar activation with hamstring activation and cramping in hamstrings with greater resistance.  Pt getting 117 degrees of PROM after coming to PT feeling very stiff today.    PT Treatment/Interventions  ADLs/Self Care Home Management;Electrical Stimulation;Moist Heat;DME Instruction;Gait training;Stair training;Functional mobility training;Therapeutic activities;Therapeutic exercise;Balance training;Neuromuscular re-education;Patient/family education;Manual techniques;Scar mobilization;Compression bandaging;Passive range of motion;Dry needling;Taping;Vasopneumatic Device;Joint Manipulations    PT Next Visit Plan  Update any HEP for quad strength, continue with manual techniques for increased knee ROM    PT Home Exercise Plan  [facility]    Consulted and Agree with Plan of Care  Patient       Patient will benefit from skilled therapeutic intervention in order to improve the following deficits and impairments:  Abnormal gait, Decreased activity tolerance, Decreased balance, Decreased endurance, Decreased mobility, Decreased range of motion, Decreased skin integrity, Decreased scar mobility, Decreased strength, Increased edema, Impaired flexibility, Postural dysfunction, Pain  Visit Diagnosis: Other abnormalities of gait and mobility  Unsteadiness on feet  Chronic pain of left knee  Stiffness of left knee, not elsewhere classified  Abnormal posture  Localized edema  Muscle weakness     Problem List Patient Active Problem List   Diagnosis Date Noted  . Status post total left knee replacement 04/29/2019  . Impingement syndrome of right shoulder 10/28/2018  . Chronic pain of left knee  03/27/2018  . Chronic right shoulder pain 03/27/2018  . Cervicalgia 03/27/2018  . Exposure of implanted vaginal mesh ([place]) 03/06/2018  . Outlet dysfunction constipation 03/06/2018  . Status post arthroscopy of left knee 08/06/2017  . Unilateral primary osteoarthritis, left knee 08/06/2017  . Tremor 07/10/2017  . Other meniscus derangements, posterior horn of medial meniscus, left knee 06/28/2017  . Ureteral stone 05/23/2017  . History of sepsis 05/23/2017  .  Acute pain of left knee 04/09/2017  . Trochanteric bursitis, right hip 09/27/2016  . GAD (generalized anxiety disorder) 05/31/2014  . Rectocele 08/21/2011  . Uterovaginal prolapse 08/21/2011  . Dyspnea 10/13/2010  . Hypertension 10/13/2010    Jule Ser, PT 10/07/2019, 5:21 PM  [facility] [address] [place], [place], [address] Phone: [phone]   Fax:  [phone]  Name: Micayla Romm MRN: [id number] Date of Birth: 08-22-1965

## 2019-10-08 ENCOUNTER — Ambulatory Visit: Payer: Self-pay | Admitting: Physical Therapy

## 2019-10-08 ENCOUNTER — Encounter: Payer: Self-pay | Admitting: Physical Therapy

## 2019-10-08 DIAGNOSIS — M25562 Pain in left knee: Secondary | ICD-10-CM

## 2019-10-08 DIAGNOSIS — M25662 Stiffness of left knee, not elsewhere classified: Secondary | ICD-10-CM

## 2019-10-08 DIAGNOSIS — M6281 Muscle weakness (generalized): Secondary | ICD-10-CM

## 2019-10-08 DIAGNOSIS — R2689 Other abnormalities of gait and mobility: Secondary | ICD-10-CM

## 2019-10-08 DIAGNOSIS — R293 Abnormal posture: Secondary | ICD-10-CM

## 2019-10-08 DIAGNOSIS — R2681 Unsteadiness on feet: Secondary | ICD-10-CM

## 2019-10-08 DIAGNOSIS — G8929 Other chronic pain: Secondary | ICD-10-CM

## 2019-10-08 NOTE — Therapy (Signed)
[facility] [address] [place], [place], [address] Phone: [phone]   Fax:  [phone]  Physical Therapy Treatment  Patient Details  Name: Sharon [name] MRN: [id number] Date of Birth: 02/27/1966 Referring Provider (PT): Christopher Blackman, MD   Encounter Date: 10/08/2019  PT End of Session - 10/08/19 1236    Visit Number  39    Date for PT Re-Evaluation  11/17/19    Authorization Time Period  MET deductible,  100% covered for 2020    PT Start Time  1231    PT Stop Time  1309    PT Time Calculation (min)  38 min    Activity Tolerance  Patient tolerated treatment well    Behavior During Therapy  [facility] for tasks assessed/performed       Past Medical History:  Diagnosis Date  . Allergy   . Anal fissure   . Anemia    borderline  . Anxiety   . Arthritis   . Blood clot in vein    left leg  . Detached retina    BOTH SCLERA BUCKLE BOTH EYES  . DVT (deep venous thrombosis) (HCC)    left leg  . Endometriosis   . Family history of adverse reaction to anesthesia    DAUGHTER POST OP PONV  . GERD (gastroesophageal reflux disease)   . Glaucoma    RIGHT WORST THAN LEFT  . Heart murmur    "caused by anxiety"   . History of hemorrhoids   . History of kidney stones   . Hypertension   . IBS (irritable bowel syndrome)   . Perforated eardrum, right    SMALL HOLE  . PONV (postoperative nausea and vomiting)   . Pulmonary embolism (H[facility]) 6 YRS AGO    Past Surgical History:  Procedure Laterality Date  . ABDOMINAL HYSTERECTOMY    . CATARACT EXTRACTION Bilateral 2015  . COLONOSCOPY    . CYSTOSCOPY W/ URETERAL STENT PLACEMENT Right 05/23/2017   Procedure: CYSTOSCOPY WITH RETROGRADE PYELOGRAM/URETERAL RIGHT STENT PLACEMENT;  Surgeon: MacDiarmid, Scott, MD;  Location[facility];  Service: Urology;  Laterality: Right;  . CYSTOSCOPY W/ URETERAL STENT PLACEMENT Right 06/04/2017   Procedure: CYSTOSCOPY WITHRIGHT  RETROGRADE PYELOGRAMRIGHT /URETEREOSCOPY  STENT PLACEMENT;  Surgeon: Bell, Eugene D III, MD;  Location: [facility];  Service: Urology;  Laterality: Right;  . ENDOMETRIAL ABLATION    . EYE SURGERY Bilateral    cataract surgery with lens implants  . HEMORRHOID SURGERY  2011  . INCONTINENCE SURGERY    . INNER EAR SURGERY     X 2  . KNEE SURGERY    . RECTAL PROLAPSE REPAIR    . RETINAL DETACHMENT SURGERY Bilateral 2000  . RETINAL DETACHMENT SURGERY Bilateral   . TOTAL KNEE ARTHROPLASTY Left 04/29/2019  . TOTAL KNEE ARTHROPLASTY Left 04/29/2019   Procedure: LEFT TOTAL KNEE ARTHROPLASTY;  Surgeon: Blackman, Christopher Y, MD;  Location: [facility];  Service: Orthopedics;  Laterality: Left;  . TUBAL LIGATION    . VEIN SURGERY Left 04/2010    There were no vitals filed for this visit.  Subjective Assessment - 10/08/19 1237    Subjective  Stiff knee, I fell ike I'm not bending great    Pertinent History  right shoulder impingement, cervicalgia, right Trochanteric bursitis, dyspnea, HTN,    Currently in Pain?  Yes    Pain Score  7     Pain Location  Knee    Pain Orientation  Left      Pain Descriptors / Indicators  Sore;Tightness    Multiple Pain Sites  No         [facility] PT Assessment - 10/08/19 0001      PROM   Left Knee Flexion  111                   [facility] Adult PT Treatment/Exercise - 10/08/19 0001      Knee/Hip Exercises: Aerobic   Elliptical  R5 L2 x 3 min      Knee/Hip Exercises: Standing   Forward Step Up  Left;2 sets;10 reps;Hand Hold: 2;Step Height: 6"    Wall Squat  2 sets;10 reps    Wall Squat Limitations  Added to HEP      Knee/Hip Exercises: Seated   Long Arc Quad  AROM;Strengthening;Left;3 sets;10 reps      Manual Therapy   Passive ROM  Prone knee flexion PROM and static stretch , prone knee extension stretching             PT Education - 10/08/19 1307    Education Details  Wall squats for HEP    Person(s) Educated  Patient     Methods  Explanation;Demonstration;Verbal cues;Handout    Comprehension  Verbalized understanding;Returned demonstration          PT Long Term Goals - 09/10/19 1428      PT LONG TERM GOAL #1   Title  PROM Left knee flexion 120 degrees    Baseline  117 (09/10/19)    Status  On-going            Plan - 10/08/19 1238    Clinical Impression Statement  Pt able to demonstrate good technique with wall squats today, added them to HEP. Pt's medial quad does not have the large indentation it once had secondary to her muscle filling in. Knee rather tight today, pt only achieved 111 degrees of knee bend in supine today    Personal Factors and Comorbidities  Profession    Comorbidities  right shoulder impingement, cervicalgia, right Trochanteric bursitis, dyspnea, HTN    Examination-Activity Limitations  Bend;Lift;Locomotion Level;Stand;Stairs;Transfers    Examination-Participation Restrictions  Community Activity    Stability/Clinical Decision Making  Stable/Uncomplicated    Rehab Potential  Excellent    PT Frequency  2x / week    PT Duration  12 weeks    PT Treatment/Interventions  ADLs/Self Care Home Management;Electrical Stimulation;Moist Heat;DME Instruction;Gait training;Stair training;Functional mobility training;Therapeutic activities;Therapeutic exercise;Balance training;Neuromuscular re-education;Patient/family education;Manual techniques;Scar mobilization;Compression bandaging;Passive range of motion;Dry needling;Taping;Vasopneumatic Device;Joint Manipulations    PT Next Visit Plan  Update any HEP for quad strength, continue with manual techniques for increased knee ROM    PT Home Exercise Plan  [id number]    Consulted and Agree with Plan of Care  Patient       Patient will benefit from skilled therapeutic intervention in order to improve the following deficits and impairments:  Abnormal gait, Decreased activity tolerance, Decreased balance, Decreased endurance, Decreased mobility,  Decreased range of motion, Decreased skin integrity, Decreased scar mobility, Decreased strength, Increased edema, Impaired flexibility, Postural dysfunction, Pain  Visit Diagnosis: Other abnormalities of gait and mobility  Unsteadiness on feet  Chronic pain of left knee  Stiffness of left knee, not elsewhere classified  Abnormal posture  Muscle weakness     Problem List Patient Active Problem List   Diagnosis Date Noted  . Status post total left knee replacement 04/29/2019  . Impingement syndrome of right shoulder 10/28/2018  . Chronic pain   of left knee 03/27/2018  . Chronic right shoulder pain 03/27/2018  . Cervicalgia 03/27/2018  . Exposure of implanted vaginal mesh ([place]) 03/06/2018  . Outlet dysfunction constipation 03/06/2018  . Status post arthroscopy of left knee 08/06/2017  . Unilateral primary osteoarthritis, left knee 08/06/2017  . Tremor 07/10/2017  . Other meniscus derangements, posterior horn of medial meniscus, left knee 06/28/2017  . Ureteral stone 05/23/2017  . History of sepsis 05/23/2017  . Acute pain of left knee 04/09/2017  . Trochanteric bursitis, right hip 09/27/2016  . GAD (generalized anxiety disorder) 05/31/2014  . Rectocele 08/21/2011  . Uterovaginal prolapse 08/21/2011  . Dyspnea 10/13/2010  . Hypertension 10/13/2010    Lynnetta Tom, PTA 10/08/2019, 1:11 PM  [facility] [address] [place], [place], [address] Phone: [phone]   Fax:  [phone]  Name: Tayah Idrovo MRN: [id number] Date of Birth: 11-22-1965

## 2019-10-09 MED ORDER — CLONAZEPAM 0.5 MG PO TABS: 0.5000 mg | ORAL_TABLET | Freq: Every day | ORAL | 3 refills | Status: DC

## 2019-10-09 NOTE — Telephone Encounter (Signed)
Last fill day 09/30/19 Earliest fill day will be 10/27/19

## 2019-10-13 ENCOUNTER — Ambulatory Visit: Payer: Self-pay | Admitting: Physical Therapy

## 2019-10-13 ENCOUNTER — Encounter: Payer: Self-pay | Admitting: Physical Therapy

## 2019-10-13 ENCOUNTER — Other Ambulatory Visit: Payer: Self-pay

## 2019-10-13 DIAGNOSIS — R2689 Other abnormalities of gait and mobility: Secondary | ICD-10-CM

## 2019-10-13 DIAGNOSIS — R293 Abnormal posture: Secondary | ICD-10-CM

## 2019-10-13 DIAGNOSIS — M6281 Muscle weakness (generalized): Secondary | ICD-10-CM

## 2019-10-13 DIAGNOSIS — M25562 Pain in left knee: Secondary | ICD-10-CM

## 2019-10-13 DIAGNOSIS — G8929 Other chronic pain: Secondary | ICD-10-CM

## 2019-10-13 DIAGNOSIS — R600 Localized edema: Secondary | ICD-10-CM

## 2019-10-13 DIAGNOSIS — M25662 Stiffness of left knee, not elsewhere classified: Secondary | ICD-10-CM

## 2019-10-13 DIAGNOSIS — R2681 Unsteadiness on feet: Secondary | ICD-10-CM

## 2019-10-13 NOTE — Therapy (Signed)
[facility] [address] [place], [place], [address] Phone: [phone]   Fax:  [phone]  Physical Therapy Treatment  Patient Details  Name: Sharon [name] MRN: [id number] Date of Birth: [birth year] Referring Provider (PT): Jean Rosenthal, MD   Encounter Date: 10/13/2019  PT End of Session - 10/13/19 1609    Visit Number  40    Date for PT Re-Evaluation  11/17/19    Authorization Type  [employer] - 30 visits beginning 2021 - track visits below    Authorization Time Period  MET deductible,  100% covered for 2020    PT Start Time  1100    PT Stop Time  1200    PT Time Calculation (min)  60 min    Activity Tolerance  Patient tolerated treatment well    Behavior During Therapy  [facility] for tasks assessed/performed       Past Medical History:  Diagnosis Date  . Allergy   . Anal fissure   . Anemia    borderline  . Anxiety   . Arthritis   . Blood clot in vein    left leg  . Detached retina    BOTH SCLERA BUCKLE BOTH EYES  . DVT (deep venous thrombosis) (HCC)    left leg  . Endometriosis   . Family history of adverse reaction to anesthesia    DAUGHTER POST OP PONV  . GERD (gastroesophageal reflux disease)   . Glaucoma    RIGHT WORST THAN LEFT  . Heart murmur    "caused by anxiety"   . History of hemorrhoids   . History of kidney stones   . Hypertension   . IBS (irritable bowel syndrome)   . Perforated eardrum, right    SMALL HOLE  . PONV (postoperative nausea and vomiting)   . Pulmonary embolism ([place]) 6 YRS AGO    Past Surgical History:  Procedure Laterality Date  . ABDOMINAL HYSTERECTOMY    . CATARACT EXTRACTION Bilateral 2015  . COLONOSCOPY    . CYSTOSCOPY W/ URETERAL STENT PLACEMENT Right 05/23/2017   Procedure: CYSTOSCOPY WITH RETROGRADE PYELOGRAM/URETERAL RIGHT STENT PLACEMENT;  Surgeon: Bjorn Loser, MD;  Location: [facility];  Service: Urology;  Laterality: Right;  . CYSTOSCOPY W/  URETERAL STENT PLACEMENT Right 06/04/2017   Procedure: CYSTOSCOPY WITHRIGHT RETROGRADE PYELOGRAMRIGHT /URETEREOSCOPY  STENT PLACEMENT;  Surgeon: Lucas Mallow, MD;  Location: [facility];  Service: Urology;  Laterality: Right;  . ENDOMETRIAL ABLATION    . EYE SURGERY Bilateral    cataract surgery with lens implants  . [place]  2011  . INCONTINENCE SURGERY    . INNER EAR SURGERY     X 2  . KNEE SURGERY    . RECTAL PROLAPSE REPAIR    . RETINAL DETACHMENT SURGERY Bilateral 2000  . RETINAL DETACHMENT SURGERY Bilateral   . TOTAL KNEE ARTHROPLASTY Left 04/29/2019  . TOTAL KNEE ARTHROPLASTY Left 04/29/2019   Procedure: LEFT TOTAL KNEE ARTHROPLASTY;  Surgeon: Mcarthur Rossetti, MD;  Location: [place];  Service: Orthopedics;  Laterality: Left;  . TUBAL LIGATION    . VEIN SURGERY Left 04/2010    There were no vitals filed for this visit.  Subjective Assessment - 10/13/19 1612    Subjective  I am going out more; going to stores, shopping, going to pool. I am walking better but my knee feels stiffer.    Currently in Pain?  No/denies  [name] Adult PT Treatment/Exercise - 10/13/19 0001      Knee/Hip Exercises: Stretches   Other Knee/Hip Stretches  Instructed pt via demonstration how to use tennis ball and soft foam roll for myofascial release to her hip , knee, and gastoc. pt with correct retrun demo.       Knee/Hip Exercises: Aerobic   Recumbent Bike  5 min prior to measurement with VC to keep PRMs > 35 the entire time. TC to keep femur from riding up.    Nustep  L3 x 10 min PTA present to assess             PT Education - 10/13/19 1618    Education Details  Myofascial release for Lt hip, knee, and gastroc using tennis ball or soft foam rol    Person(s) Educated  Patient    Methods  Explanation;Demonstration    Comprehension  Returned demonstration;Verbalized understanding          PT Long Term Goals -  09/10/19 1428      PT LONG TERM GOAL #1   Title  PROM Left knee flexion 120 degrees    Baseline  117 (09/10/19)    Status  On-going            Plan - 10/13/19 1610    Clinical Impression Statement  pt ambulates into the clinic with improving gait: improved and more consistent heel strike and better quad activation. Pt requires verbal and tactile cuing for correct femoral posterior glide with knee flexion and keeping from hiking at her hip. pt's quad conitnues to improve and she reports compliance with new HEP ( wall slides). Instructed pt today in ways to perform myofascial release at home to her hip, knee and gastroc muscles. Pt achieved 115 degrees of PROM for knee flexion.    Personal Factors and Comorbidities  Profession    Comorbidities  right shoulder impingement, cervicalgia, right Trochanteric bursitis, dyspnea, HTN    Examination-Activity Limitations  Bend;Lift;Locomotion Level;Stand;Stairs;Transfers    Examination-Participation Restrictions  Community Activity    Stability/Clinical Decision Making  Stable/Uncomplicated    Rehab Potential  Excellent    PT Frequency  2x / week    PT Duration  12 weeks    PT Treatment/Interventions  ADLs/Self Care Home Management;Electrical Stimulation;Moist Heat;DME Instruction;Gait training;Stair training;Functional mobility training;Therapeutic activities;Therapeutic exercise;Balance training;Neuromuscular re-education;Patient/family education;Manual techniques;Scar mobilization;Compression bandaging;Passive range of motion;Dry needling;Taping;Vasopneumatic Device;Joint Manipulations    PT Next Visit Plan  Update any HEP for quad strength, continue with manual techniques for increased knee ROM    PT Home Exercise Plan  [facility]    Consulted and Agree with Plan of Care  Patient       Patient will benefit from skilled therapeutic intervention in order to improve the following deficits and impairments:  Abnormal gait, Decreased activity  tolerance, Decreased balance, Decreased endurance, Decreased mobility, Decreased range of motion, Decreased skin integrity, Decreased scar mobility, Decreased strength, Increased edema, Impaired flexibility, Postural dysfunction, Pain  Visit Diagnosis: Other abnormalities of gait and mobility  Unsteadiness on feet  Chronic pain of left knee  Stiffness of left knee, not elsewhere classified  Abnormal posture  Muscle weakness  Localized edema     Problem List Patient Active Problem List   Diagnosis Date Noted  . Status post total left knee replacement 04/29/2019  . Impingement syndrome of right shoulder 10/28/2018  . Chronic pain of left knee 03/27/2018  . Chronic right shoulder pain 03/27/2018  . Cervicalgia 03/27/2018  . Exposure of  implanted vaginal mesh ([place]) 03/06/2018  . Outlet dysfunction constipation 03/06/2018  . Status post arthroscopy of left knee 08/06/2017  . Unilateral primary osteoarthritis, left knee 08/06/2017  . Tremor 07/10/2017  . Other meniscus derangements, posterior horn of medial meniscus, left knee 06/28/2017  . Ureteral stone 05/23/2017  . History of sepsis 05/23/2017  . Acute pain of left knee 04/09/2017  . Trochanteric bursitis, right hip 09/27/2016  . GAD (generalized anxiety disorder) 05/31/2014  . Rectocele 08/21/2011  . Uterovaginal prolapse 08/21/2011  . Dyspnea 10/13/2010  . Hypertension 10/13/2010    Shekinah Pitones, PTA 10/13/2019, 4:19 PM  [facility] [address], [place], [place], [address] Phone: [phone]   Fax:  [phone]  Name: Sharon [name] MRN: [id number] Date of Birth: 08/14/1965

## 2019-10-15 ENCOUNTER — Other Ambulatory Visit: Payer: Self-pay

## 2019-10-15 ENCOUNTER — Ambulatory Visit: Payer: Self-pay | Admitting: Physical Therapy

## 2019-10-15 ENCOUNTER — Encounter: Payer: Self-pay | Admitting: Physical Therapy

## 2019-10-15 DIAGNOSIS — R293 Abnormal posture: Secondary | ICD-10-CM

## 2019-10-15 DIAGNOSIS — M25662 Stiffness of left knee, not elsewhere classified: Secondary | ICD-10-CM

## 2019-10-15 DIAGNOSIS — G8929 Other chronic pain: Secondary | ICD-10-CM

## 2019-10-15 DIAGNOSIS — M6281 Muscle weakness (generalized): Secondary | ICD-10-CM

## 2019-10-15 DIAGNOSIS — R2681 Unsteadiness on feet: Secondary | ICD-10-CM

## 2019-10-15 DIAGNOSIS — M25562 Pain in left knee: Secondary | ICD-10-CM

## 2019-10-15 DIAGNOSIS — R2689 Other abnormalities of gait and mobility: Secondary | ICD-10-CM

## 2019-10-15 NOTE — Therapy (Signed)
[facility] [address], [place], [place], [address] Phone: [phone]   Fax:  [phone]  Physical Therapy Treatment  Patient Details  Name: Sharon [name] MRN: [id number] Date of Birth: 12-Nov-1965 Referring Provider (PT): Jean Rosenthal, MD   Encounter Date: 10/15/2019  PT End of Session - 10/15/19 1623    Visit Number  41    Date for PT Re-Evaluation  11/17/19    Authorization Type  Med[employer] - 30 visits beginning 2021 - track visits below    Authorization Time Period  MET deductible,  100% covered for 2020    PT Start Time  1617    PT Stop Time  1658    PT Time Calculation (min)  41 min    Activity Tolerance  Patient tolerated treatment well    Behavior During Therapy  [facility] for tasks assessed/performed       Past Medical History:  Diagnosis Date  . Allergy   . Anal fissure   . Anemia    borderline  . Anxiety   . Arthritis   . Blood clot in vein    left leg  . Detached retina    BOTH SCLERA BUCKLE BOTH EYES  . DVT (deep venous thrombosis) (HCC)    left leg  . Endometriosis   . Family history of adverse reaction to anesthesia    DAUGHTER POST OP PONV  . GERD (gastroesophageal reflux disease)   . Glaucoma    RIGHT WORST THAN LEFT  . Heart murmur    "caused by anxiety"   . History of hemorrhoids   . History of kidney stones   . Hypertension   . IBS (irritable bowel syndrome)   . Perforated eardrum, right    SMALL HOLE  . PONV (postoperative nausea and vomiting)   . Pulmonary embolism ([place]) 6 YRS AGO    Past Surgical History:  Procedure Laterality Date  . ABDOMINAL HYSTERECTOMY    . CATARACT EXTRACTION Bilateral 2015  . COLONOSCOPY    . CYSTOSCOPY W/ URETERAL STENT PLACEMENT Right 05/23/2017   Procedure: CYSTOSCOPY WITH RETROGRADE PYELOGRAM/URETERAL RIGHT STENT PLACEMENT;  Surgeon: Bjorn Loser, MD;  [facility];  Service: Urology;  Laterality: Right;  . CYSTOSCOPY W/  URETERAL STENT PLACEMENT Right 06/04/2017   Procedure: CYSTOSCOPY WITHRIGHT RETROGRADE PYELOGRAMRIGHT /URETEREOSCOPY  STENT PLACEMENT;  Surgeon: Lucas Mallow, MD;  Location: [facility];  Service: Urology;  Laterality: Right;  . ENDOMETRIAL ABLATION    . EYE SURGERY Bilateral    cataract surgery with lens implants  . [place]  2011  . INCONTINENCE SURGERY    . INNER EAR SURGERY     X 2  . KNEE SURGERY    . RECTAL PROLAPSE REPAIR    . RETINAL DETACHMENT SURGERY Bilateral 2000  . RETINAL DETACHMENT SURGERY Bilateral   . TOTAL KNEE ARTHROPLASTY Left 04/29/2019  . TOTAL KNEE ARTHROPLASTY Left 04/29/2019   Procedure: LEFT TOTAL KNEE ARTHROPLASTY;  Surgeon: Mcarthur Rossetti, MD;  Location: [place];  Service: Orthopedics;  Laterality: Left;  . TUBAL LIGATION    . VEIN SURGERY Left 04/2010    There were no vitals filed for this visit.  Subjective Assessment - 10/15/19 1626    Subjective  No new complaints, I went to the pool yesterday and did a good workout.    Pertinent History  right shoulder impingement, cervicalgia, right Trochanteric bursitis, dyspnea, HTN,    Currently in Pain?  Yes  Pain Score  5     Pain Location  Knee    Pain Orientation  Left;Medial    Pain Descriptors / Indicators  Aching    Aggravating Factors   When I over do it    Pain Relieving Factors  ice    Multiple Pain Sites  No         [facility] PT Assessment - 10/15/19 0001      PROM   Left Knee Flexion  118                   [facility] Adult PT Treatment/Exercise - 10/15/19 0001      Knee/Hip Exercises: Aerobic   Recumbent Bike  L1 8 min      Knee/Hip Exercises: Seated   Long Arc Quad  Strengthening;Left;3 sets;10 reps;Weights    Long Arc Quad Weight  1 lbs.      Manual Therapy   Passive ROM  Prone knee flexion PROM and static stretch , prone knee extension stretching   Seated knee flexion static stretching                 PT Long Term Goals -  09/10/19 1428      PT LONG TERM GOAL #1   Title  PROM Left knee flexion 120 degrees    Baseline  117 (09/10/19)    Status  On-going            Plan - 10/15/19 1624    Clinical Impression Statement  Pt able to perform LAQ with 1# weight today. PROM at end of session 118 degrees.    Personal Factors and Comorbidities  Profession    Comorbidities  right shoulder impingement, cervicalgia, right Trochanteric bursitis, dyspnea, HTN    Examination-Activity Limitations  Bend;Lift;Locomotion Level;Stand;Stairs;Transfers    Examination-Participation Restrictions  Community Activity    Stability/Clinical Decision Making  Stable/Uncomplicated    Rehab Potential  Excellent    PT Frequency  2x / week    PT Duration  12 weeks    PT Treatment/Interventions  ADLs/Self Care Home Management;Electrical Stimulation;Moist Heat;DME Instruction;Gait training;Stair training;Functional mobility training;Therapeutic activities;Therapeutic exercise;Balance training;Neuromuscular re-education;Patient/family education;Manual techniques;Scar mobilization;Compression bandaging;Passive range of motion;Dry needling;Taping;Vasopneumatic Device;Joint Manipulations    PT Next Visit Plan  Update any HEP for quad strength, continue with manual techniques for increased knee ROM    PT Home Exercise Plan  [email or web address]    Consulted and Agree with Plan of Care  Patient       Patient will benefit from skilled therapeutic intervention in order to improve the following deficits and impairments:  Abnormal gait, Decreased activity tolerance, Decreased balance, Decreased endurance, Decreased mobility, Decreased range of motion, Decreased skin integrity, Decreased scar mobility, Decreased strength, Increased edema, Impaired flexibility, Postural dysfunction, Pain  Visit Diagnosis: Other abnormalities of gait and mobility  Unsteadiness on feet  Chronic pain of left knee  Stiffness of left knee, not elsewhere classified  Abnormal  posture  Muscle weakness     Problem List Patient Active Problem List   Diagnosis Date Noted  . Status post total left knee replacement 04/29/2019  . Impingement syndrome of right shoulder 10/28/2018  . Chronic pain of left knee 03/27/2018  . Chronic right shoulder pain 03/27/2018  . Cervicalgia 03/27/2018  . Exposure of implanted vaginal mesh (HCC) 03/06/2018  . Outlet dysfunction constipation 03/06/2018  . Status post arthroscopy of left knee 08/06/2017  . Unilateral primary osteoarthritis, left knee 08/06/2017  . Tremor 07/10/2017  . Other meniscus   derangements, posterior horn of medial meniscus, left knee 06/28/2017  . Ureteral stone 05/23/2017  . History of sepsis 05/23/2017  . Acute pain of left knee 04/09/2017  . Trochanteric bursitis, right hip 09/27/2016  . GAD (generalized anxiety disorder) 05/31/2014  . Rectocele 08/21/2011  . Uterovaginal prolapse 08/21/2011  . Dyspnea 10/13/2010  . Hypertension 10/13/2010    Jovan Schickling, PTA 10/15/2019, 5:01 PM  [facility] [address] [place], [place], [address] Phone: [phone]   Fax:  [phone]  Name: Aubrina Nieman MRN: [id number] Date of Birth: 1966/05/31

## 2019-10-20 ENCOUNTER — Ambulatory Visit: Payer: PRIVATE HEALTH INSURANCE | Attending: Orthopaedic Surgery | Admitting: Physical Therapy

## 2019-10-20 ENCOUNTER — Encounter: Payer: Self-pay | Admitting: Physical Therapy

## 2019-10-20 ENCOUNTER — Other Ambulatory Visit: Payer: Self-pay

## 2019-10-20 DIAGNOSIS — R2689 Other abnormalities of gait and mobility: Secondary | ICD-10-CM

## 2019-10-20 DIAGNOSIS — R2681 Unsteadiness on feet: Secondary | ICD-10-CM | POA: Diagnosis present

## 2019-10-20 DIAGNOSIS — R600 Localized edema: Secondary | ICD-10-CM | POA: Diagnosis present

## 2019-10-20 DIAGNOSIS — R293 Abnormal posture: Secondary | ICD-10-CM | POA: Insufficient documentation

## 2019-10-20 DIAGNOSIS — M25662 Stiffness of left knee, not elsewhere classified: Secondary | ICD-10-CM | POA: Diagnosis present

## 2019-10-20 DIAGNOSIS — M25562 Pain in left knee: Secondary | ICD-10-CM | POA: Insufficient documentation

## 2019-10-20 DIAGNOSIS — G8929 Other chronic pain: Secondary | ICD-10-CM | POA: Insufficient documentation

## 2019-10-20 DIAGNOSIS — M6281 Muscle weakness (generalized): Secondary | ICD-10-CM | POA: Diagnosis present

## 2019-10-20 NOTE — Therapy (Signed)
[facility] [address] [place], [place], [address] Phone: [phone]   Fax:  [phone]  Physical Therapy Treatment  Patient Details  Name: Sharon [name] MRN: [id number] Date of Birth: 08-Sep-1965 Referring Provider (PT): Jean Rosenthal, MD   Encounter Date: 10/20/2019  PT End of Session - 10/20/19 1246    Visit Number  42    Date for PT Re-Evaluation  11/17/19    Authorization Type  Medcost - 30 visits beginning 2021 - track visits below    Authorization Time Period  MET deductible,  100% covered for 2020    PT Start Time  1231    PT Stop Time  1315    PT Time Calculation (min)  44 min    Activity Tolerance  Patient tolerated treatment well    Behavior During Therapy  [facility] for tasks assessed/performed       Past Medical History:  Diagnosis Date  . Allergy   . Anal fissure   . Anemia    borderline  . Anxiety   . Arthritis   . Blood clot in vein    left leg  . Detached retina    BOTH SCLERA BUCKLE BOTH EYES  . DVT (deep venous thrombosis) (HCC)    left leg  . Endometriosis   . Family history of adverse reaction to anesthesia    DAUGHTER POST OP PONV  . GERD (gastroesophageal reflux disease)   . Glaucoma    RIGHT WORST THAN LEFT  . Heart murmur    "caused by anxiety"   . History of hemorrhoids   . History of kidney stones   . Hypertension   . IBS (irritable bowel syndrome)   . Perforated eardrum, right    SMALL HOLE  . PONV (postoperative nausea and vomiting)   . Pulmonary embolism ([place]) 6 YRS AGO    Past Surgical History:  Procedure Laterality Date  . ABDOMINAL HYSTERECTOMY    . CATARACT EXTRACTION Bilateral 2015  . COLONOSCOPY    . CYSTOSCOPY W/ URETERAL STENT PLACEMENT Right 05/23/2017   Procedure: CYSTOSCOPY WITH RETROGRADE PYELOGRAM/URETERAL RIGHT STENT PLACEMENT;  Surgeon: Bjorn Loser, MD;  [facility];  Service: Urology;  Laterality: Right;  . CYSTOSCOPY W/  URETERAL STENT PLACEMENT Right 06/04/2017   Procedure: CYSTOSCOPY WITHRIGHT RETROGRADE PYELOGRAMRIGHT /URETEREOSCOPY  STENT PLACEMENT;  Surgeon: Lucas Mallow, MD;  Location: [facility];  Service: Urology;  Laterality: Right;  . ENDOMETRIAL ABLATION    . EYE SURGERY Bilateral    cataract surgery with lens implants  . [place]  2011  . INCONTINENCE SURGERY    . INNER EAR SURGERY     X 2  . KNEE SURGERY    . RECTAL PROLAPSE REPAIR    . RETINAL DETACHMENT SURGERY Bilateral 2000  . RETINAL DETACHMENT SURGERY Bilateral   . TOTAL KNEE ARTHROPLASTY Left 04/29/2019  . TOTAL KNEE ARTHROPLASTY Left 04/29/2019   Procedure: LEFT TOTAL KNEE ARTHROPLASTY;  Surgeon: Mcarthur Rossetti, MD;  Location: [place];  Service: Orthopedics;  Laterality: Left;  . TUBAL LIGATION    . VEIN SURGERY Left 04/2010    There were no vitals filed for this visit.  Subjective Assessment - 10/20/19 1258    Subjective  Tender from all my work yesterday.    Pertinent History  right shoulder impingement, cervicalgia, right Trochanteric bursitis, dyspnea, HTN,    Currently in Pain?  Yes    Pain Score  6  Pain Location  Knee    Pain Orientation  Left    Pain Descriptors / Indicators  Sore    Multiple Pain Sites  No         [facility] PT Assessment - 10/20/19 0001      PROM   Left Knee Flexion  118                   [facility] Adult PT Treatment/Exercise - 10/20/19 0001      Knee/Hip Exercises: Seated   Long Arc Quad  Strengthening;Left;3 sets;10 reps;Weights    Long Arc Quad Weight  2 lbs.   pt  will do 2# at home     Manual Therapy   Passive ROM  Prone knee flexion PROM and static stretch , prone knee extension stretching   Seated knee flexion static stretching                 PT Long Term Goals - 09/10/19 1428      PT LONG TERM GOAL #1   Title  PROM Left knee flexion 120 degrees    Baseline  117 (09/10/19)    Status  On-going             Plan - 10/20/19 1356    Clinical Impression Statement  Pt increased LAQ to 2# today, PROM again 118 degrees.    Personal Factors and Comorbidities  Profession    Comorbidities  right shoulder impingement, cervicalgia, right Trochanteric bursitis, dyspnea, HTN    Examination-Activity Limitations  Bend;Lift;Locomotion Level;Stand;Stairs;Transfers    Examination-Participation Restrictions  Community Activity    Stability/Clinical Decision Making  Stable/Uncomplicated    Rehab Potential  Excellent    PT Frequency  2x / week    PT Duration  12 weeks    PT Treatment/Interventions  ADLs/Self Care Home Management;Electrical Stimulation;Moist Heat;DME Instruction;Gait training;Stair training;Functional mobility training;Therapeutic activities;Therapeutic exercise;Balance training;Neuromuscular re-education;Patient/family education;Manual techniques;Scar mobilization;Compression bandaging;Passive range of motion;Dry needling;Taping;Vasopneumatic Device;Joint Manipulations    PT Next Visit Plan  Continue with manual techniques for increased knee ROM    PT Home Exercise Plan  [facility]    Consulted and Agree with Plan of Care  Patient       Patient will benefit from skilled therapeutic intervention in order to improve the following deficits and impairments:  Abnormal gait, Decreased activity tolerance, Decreased balance, Decreased endurance, Decreased mobility, Decreased range of motion, Decreased skin integrity, Decreased scar mobility, Decreased strength, Increased edema, Impaired flexibility, Postural dysfunction, Pain  Visit Diagnosis: Other abnormalities of gait and mobility  Unsteadiness on feet  Chronic pain of left knee  Stiffness of left knee, not elsewhere classified  Abnormal posture  Muscle weakness     Problem List Patient Active Problem List   Diagnosis Date Noted  . Status post total left knee replacement 04/29/2019  . Impingement syndrome of right shoulder  10/28/2018  . Chronic pain of left knee 03/27/2018  . Chronic right shoulder pain 03/27/2018  . Cervicalgia 03/27/2018  . Exposure of implanted vaginal mesh ([place]) 03/06/2018  . Outlet dysfunction constipation 03/06/2018  . Status post arthroscopy of left knee 08/06/2017  . Unilateral primary osteoarthritis, left knee 08/06/2017  . Tremor 07/10/2017  . Other meniscus derangements, posterior horn of medial meniscus, left knee 06/28/2017  . Ureteral stone 05/23/2017  . History of sepsis 05/23/2017  . Acute pain of left knee 04/09/2017  . Trochanteric bursitis, right hip 09/27/2016  . GAD (generalized anxiety disorder) 05/31/2014  . Rectocele 08/21/2011  .  Uterovaginal prolapse 08/21/2011  . Dyspnea 10/13/2010  . Hypertension 10/13/2010    Forest Pruden, PTA 10/20/2019, 1:58 PM  [facility] [address] [place], [place], [address] Phone: [phone]   Fax:  [phone]  Name: Donnice Nielsen MRN: [id number] Date of Birth: 1966/03/27

## 2019-10-22 ENCOUNTER — Encounter: Payer: Self-pay | Admitting: Physical Therapy

## 2019-10-22 ENCOUNTER — Ambulatory Visit: Payer: PRIVATE HEALTH INSURANCE | Admitting: Physical Therapy

## 2019-10-22 ENCOUNTER — Other Ambulatory Visit: Payer: Self-pay

## 2019-10-22 DIAGNOSIS — R2689 Other abnormalities of gait and mobility: Secondary | ICD-10-CM | POA: Diagnosis not present

## 2019-10-22 DIAGNOSIS — M25562 Pain in left knee: Secondary | ICD-10-CM

## 2019-10-22 DIAGNOSIS — M25662 Stiffness of left knee, not elsewhere classified: Secondary | ICD-10-CM

## 2019-10-22 DIAGNOSIS — R2681 Unsteadiness on feet: Secondary | ICD-10-CM

## 2019-10-22 NOTE — Therapy (Signed)
[facility] [address] [place], [place], [address] Phone: [phone]   Fax:  [phone]  Physical Therapy Treatment  Patient Details  Name: Sharon [name] MRN: [id number] Date of Birth: Oct 25, 1965 Referring Provider (PT): Jean Rosenthal, MD   Encounter Date: 10/22/2019  PT End of Session - 10/22/19 1451    Visit Number  43    Date for PT Re-Evaluation  11/17/19    Authorization Type  [employer] - 30 visits beginning 2021 - track visits below    Authorization Time Period  MET deductible,  100% covered for 2020    PT Start Time  1447    PT Stop Time  1525    PT Time Calculation (min)  38 min    Activity Tolerance  Patient tolerated treatment well    Behavior During Therapy  [facility] for tasks assessed/performed       Past Medical History:  Diagnosis Date  . Allergy   . Anal fissure   . Anemia    borderline  . Anxiety   . Arthritis   . Blood clot in vein    left leg  . Detached retina    BOTH SCLERA BUCKLE BOTH EYES  . DVT (deep venous thrombosis) (HCC)    left leg  . Endometriosis   . Family history of adverse reaction to anesthesia    DAUGHTER POST OP PONV  . GERD (gastroesophageal reflux disease)   . Glaucoma    RIGHT WORST THAN LEFT  . Heart murmur    "caused by anxiety"   . History of hemorrhoids   . History of kidney stones   . Hypertension   . IBS (irritable bowel syndrome)   . Perforated eardrum, right    SMALL HOLE  . PONV (postoperative nausea and vomiting)   . Pulmonary embolism ([place]) 6 YRS AGO    Past Surgical History:  Procedure Laterality Date  . ABDOMINAL HYSTERECTOMY    . CATARACT EXTRACTION Bilateral 2015  . COLONOSCOPY    . CYSTOSCOPY W/ URETERAL STENT PLACEMENT Right 05/23/2017   Procedure: CYSTOSCOPY WITH RETROGRADE PYELOGRAM/URETERAL RIGHT STENT PLACEMENT;  Surgeon: Bjorn Loser, MD;  Location: [facility];  Service: Urology;  Laterality: Right;  . CYSTOSCOPY W/  URETERAL STENT PLACEMENT Right 06/04/2017   Procedure: CYSTOSCOPY WITHRIGHT RETROGRADE PYELOGRAMRIGHT /URETEREOSCOPY  STENT PLACEMENT;  Surgeon: Lucas Mallow, MD;  Location[facility];  Service: Urology;  Laterality: Right;  . ENDOMETRIAL ABLATION    . EYE SURGERY Bilateral    cataract surgery with lens implants  . [place]  2011  . INCONTINENCE SURGERY    . INNER EAR SURGERY     X 2  . KNEE SURGERY    . RECTAL PROLAPSE REPAIR    . RETINAL DETACHMENT SURGERY Bilateral 2000  . RETINAL DETACHMENT SURGERY Bilateral   . TOTAL KNEE ARTHROPLASTY Left 04/29/2019  . TOTAL KNEE ARTHROPLASTY Left 04/29/2019   Procedure: LEFT TOTAL KNEE ARTHROPLASTY;  Surgeon: Mcarthur Rossetti, MD;  Location: [place];  Service: Orthopedics;  Laterality: Left;  . TUBAL LIGATION    . VEIN SURGERY Left 04/2010    There were no vitals filed for this visit.  Subjective Assessment - 10/22/19 1452    Subjective  Stiff    Pertinent History  right shoulder impingement, cervicalgia, right Trochanteric bursitis, dyspnea, HTN,    Currently in Pain?  Yes    Pain Score  4     Pain Location  Knee    Pain Orientation  Left    Pain Descriptors / Indicators  Tightness    Multiple Pain Sites  No         [facility] PT Assessment - 10/22/19 0001      PROM   Left Knee Flexion  121                   [facility] Adult PT Treatment/Exercise - 10/22/19 0001      Knee/Hip Exercises: Aerobic   Recumbent Bike  L1x 71mn knee warm up      Knee/Hip Exercises: Seated   Long Arc Quad  Strengthening;Left;3 sets;10 reps;Weights    Long Arc Quad Weight  2 lbs.   pt  will do 2# at home     Manual Therapy   Joint Mobilization  Lt hip posterior mobs intermittently when stetching the knee in supine    Passive ROM  Prone knee flexion PROM and static stretch , prone knee extension stretching   Seated knee flexion static stretching                 PT Long Term Goals - 09/10/19  1428      PT LONG TERM GOAL #1   Title  PROM Left knee flexion 120 degrees    Baseline  117 (09/10/19)    Status  On-going            Plan - 10/22/19 1451    Clinical Impression Statement  Pt tolerated 3# ankle weight for LAQ today and will increase at home. PROM for knee flexion 121 degrees but the last 5 degrees the PTA had to mobilize the femur posteriorly.    Personal Factors and Comorbidities  Profession    Comorbidities  right shoulder impingement, cervicalgia, right Trochanteric bursitis, dyspnea, HTN    Examination-Activity Limitations  Bend;Lift;Locomotion Level;Stand;Stairs;Transfers    Examination-Participation Restrictions  Community Activity    Stability/Clinical Decision Making  Stable/Uncomplicated    Rehab Potential  Excellent    PT Frequency  2x / week    PT Duration  12 weeks    PT Treatment/Interventions  ADLs/Self Care Home Management;Electrical Stimulation;Moist Heat;DME Instruction;Gait training;Stair training;Functional mobility training;Therapeutic activities;Therapeutic exercise;Balance training;Neuromuscular re-education;Patient/family education;Manual techniques;Scar mobilization;Compression bandaging;Passive range of motion;Dry needling;Taping;Vasopneumatic Device;Joint Manipulations    PT Next Visit Plan  Continue with manual techniques for increased knee ROM    PT Home Exercise Plan  [facility]   Consulted and Agree with Plan of Care  Patient       Patient will benefit from skilled therapeutic intervention in order to improve the following deficits and impairments:  Abnormal gait, Decreased activity tolerance, Decreased balance, Decreased endurance, Decreased mobility, Decreased range of motion, Decreased skin integrity, Decreased scar mobility, Decreased strength, Increased edema, Impaired flexibility, Postural dysfunction, Pain  Visit Diagnosis: Unsteadiness on feet  Other abnormalities of gait and mobility  Chronic pain of left knee  Stiffness of  left knee, not elsewhere classified     Problem List Patient Active Problem List   Diagnosis Date Noted  . Status post total left knee replacement 04/29/2019  . Impingement syndrome of right shoulder 10/28/2018  . Chronic pain of left knee 03/27/2018  . Chronic right shoulder pain 03/27/2018  . Cervicalgia 03/27/2018  . Exposure of implanted vaginal mesh ([place] 03/06/2018  . Outlet dysfunction constipation 03/06/2018  . Status post arthroscopy of left knee 08/06/2017  . Unilateral primary osteoarthritis, left knee 08/06/2017  . Tremor 07/10/2017  . Other meniscus  derangements, posterior horn of medial meniscus, left knee 06/28/2017  . Ureteral stone 05/23/2017  . History of sepsis 05/23/2017  . Acute pain of left knee 04/09/2017  . Trochanteric bursitis, right hip 09/27/2016  . GAD (generalized anxiety disorder) 05/31/2014  . Rectocele 08/21/2011  . Uterovaginal prolapse 08/21/2011  . Dyspnea 10/13/2010  . Hypertension 10/13/2010    Sebastin Perlmutter, PTA 10/22/2019, 3:37 PM   [facility] [address], [place], [place], [address] Phone: [phone]   Fax:  [phone]  Name: Sharon [name] MRN: [id number] Date of Birth: 04-29-1966

## 2019-10-24 ENCOUNTER — Other Ambulatory Visit: Payer: Self-pay | Admitting: Neurology

## 2019-10-27 ENCOUNTER — Encounter: Payer: Self-pay | Admitting: Physical Therapy

## 2019-10-27 ENCOUNTER — Ambulatory Visit: Payer: PRIVATE HEALTH INSURANCE | Admitting: Physical Therapy

## 2019-10-27 ENCOUNTER — Other Ambulatory Visit: Payer: Self-pay

## 2019-10-27 DIAGNOSIS — R2681 Unsteadiness on feet: Secondary | ICD-10-CM

## 2019-10-27 DIAGNOSIS — R2689 Other abnormalities of gait and mobility: Secondary | ICD-10-CM

## 2019-10-27 DIAGNOSIS — R293 Abnormal posture: Secondary | ICD-10-CM

## 2019-10-27 DIAGNOSIS — M25562 Pain in left knee: Secondary | ICD-10-CM

## 2019-10-27 DIAGNOSIS — M25662 Stiffness of left knee, not elsewhere classified: Secondary | ICD-10-CM

## 2019-10-27 NOTE — Therapy (Signed)
[facility] [address] [place], [place], [address] Phone: [phone]   Fax:  [phone]  Physical Therapy Treatment  Patient Details  Name: Sharon [name] MRN: [id number] Date of Birth: 07/18/1965 Referring Provider (PT): Jean Rosenthal, MD   Encounter Date: 10/27/2019  PT End of Session - 10/27/19 1409    Visit Number  40    Date for PT Re-Evaluation  11/17/19    Authorization Type  [employer] - 30 visits beginning 2021 - track visits below    Authorization Time Period  MET deductible,  100% covered for 2020    PT Start Time  1405    Activity Tolerance  Patient tolerated treatment well    Behavior During Therapy  [facility] for tasks assessed/performed       Past Medical History:  Diagnosis Date  . Allergy   . Anal fissure   . Anemia    borderline  . Anxiety   . Arthritis   . Blood clot in vein    left leg  . Detached retina    BOTH SCLERA BUCKLE BOTH EYES  . DVT (deep venous thrombosis) (HCC)    left leg  . Endometriosis   . Family history of adverse reaction to anesthesia    DAUGHTER POST OP PONV  . GERD (gastroesophageal reflux disease)   . Glaucoma    RIGHT WORST THAN LEFT  . Heart murmur    "caused by anxiety"   . History of hemorrhoids   . History of kidney stones   . Hypertension   . IBS (irritable bowel syndrome)   . Perforated eardrum, right    SMALL HOLE  . PONV (postoperative nausea and vomiting)   . Pulmonary embolism ([place]) 6 YRS AGO    Past Surgical History:  Procedure Laterality Date  . ABDOMINAL HYSTERECTOMY    . CATARACT EXTRACTION Bilateral 2015  . COLONOSCOPY    . CYSTOSCOPY W/ URETERAL STENT PLACEMENT Right 05/23/2017   Procedure: CYSTOSCOPY WITH RETROGRADE PYELOGRAM/URETERAL RIGHT STENT PLACEMENT;  Surgeon: Bjorn Loser, MD;  [facility];  Service: Urology;  Laterality: Right;  . CYSTOSCOPY W/ URETERAL STENT PLACEMENT Right 06/04/2017   Procedure:  CYSTOSCOPY WITHRIGHT RETROGRADE PYELOGRAMRIGHT /URETEREOSCOPY  STENT PLACEMENT;  Surgeon: Lucas Mallow, MD;  Location: [facility];  Service: Urology;  Laterality: Right;  . ENDOMETRIAL ABLATION    . EYE SURGERY Bilateral    cataract surgery with lens implants  . [place]  2011  . INCONTINENCE SURGERY    . INNER EAR SURGERY     X 2  . KNEE SURGERY    . RECTAL PROLAPSE REPAIR    . RETINAL DETACHMENT SURGERY Bilateral 2000  . RETINAL DETACHMENT SURGERY Bilateral   . TOTAL KNEE ARTHROPLASTY Left 04/29/2019  . TOTAL KNEE ARTHROPLASTY Left 04/29/2019   Procedure: LEFT TOTAL KNEE ARTHROPLASTY;  Surgeon: Mcarthur Rossetti, MD;  Location: [place];  Service: Orthopedics;  Laterality: Left;  . TUBAL LIGATION    . VEIN SURGERY Left 04/2010    There were no vitals filed for this visit.  Subjective Assessment - 10/27/19 1410    Subjective  Stiff, had to take more medication due to pain.    Currently in Pain?  Yes    Pain Score  8     Pain Location  Knee    Pain Orientation  Left    Pain Descriptors / Indicators  Sharp;Tightness    Aggravating Factors   So  random.Marland KitchenMarland KitchenMarland KitchenI can just wake up and it hurts or is super tight.    Pain Relieving Factors  ice, stretching it.    Multiple Pain Sites  No                       [facility] Adult PT Treatment/Exercise - 10/27/19 0001      Knee/Hip Exercises: Aerobic   Recumbent Bike  L1x 67mn knee warm up      Knee/Hip Exercises: Seated   Long Arc Quad  Strengthening;Left;3 sets;10 reps;Weights    Long Arc Quad Weight  3 lbs.      Manual Therapy   Joint Mobilization  Lt hip posterior mobs intermittently when stetching the knee in supine    Passive ROM  Prone knee flexion PROM and static stretch , prone knee extension stretching   Seated knee flexion static stretching                 PT Long Term Goals - 09/10/19 1428      PT LONG TERM GOAL #1   Title  PROM Left knee flexion 120 degrees     Baseline  117 (09/10/19)    Status  On-going            Plan - 10/27/19 1427    Clinical Impression Statement  Pt has 0 degrees passive extension, 115 degrees of Passive knee ROM today.    Personal Factors and Comorbidities  Profession    Comorbidities  right shoulder impingement, cervicalgia, right Trochanteric bursitis, dyspnea, HTN    Examination-Activity Limitations  Bend;Lift;Locomotion Level;Stand;Stairs;Transfers    Examination-Participation Restrictions  Community Activity    Stability/Clinical Decision Making  Stable/Uncomplicated    PT Frequency  2x / week    PT Duration  12 weeks    PT Treatment/Interventions  ADLs/Self Care Home Management;Electrical Stimulation;Moist Heat;DME Instruction;Gait training;Stair training;Functional mobility training;Therapeutic activities;Therapeutic exercise;Balance training;Neuromuscular re-education;Patient/family education;Manual techniques;Scar mobilization;Compression bandaging;Passive range of motion;Dry needling;Taping;Vasopneumatic Device;Joint Manipulations    PT Next Visit Plan  Continue with manual techniques for increased knee ROM: Stairs    PT Home Exercise Plan  [facility]   Consulted and Agree with Plan of Care  Patient       Patient will benefit from skilled therapeutic intervention in order to improve the following deficits and impairments:  Abnormal gait, Decreased activity tolerance, Decreased balance, Decreased endurance, Decreased mobility, Decreased range of motion, Decreased skin integrity, Decreased scar mobility, Decreased strength, Increased edema, Impaired flexibility, Postural dysfunction, Pain  Visit Diagnosis: Unsteadiness on feet  Other abnormalities of gait and mobility  Chronic pain of left knee  Stiffness of left knee, not elsewhere classified  Abnormal posture     Problem List Patient Active Problem List   Diagnosis Date Noted  . Status post total left knee replacement 04/29/2019  . Impingement  syndrome of right shoulder 10/28/2018  . Chronic pain of left knee 03/27/2018  . Chronic right shoulder pain 03/27/2018  . Cervicalgia 03/27/2018  . Exposure of implanted vaginal mesh ([place] 03/06/2018  . Outlet dysfunction constipation 03/06/2018  . Status post arthroscopy of left knee 08/06/2017  . Unilateral primary osteoarthritis, left knee 08/06/2017  . Tremor 07/10/2017  . Other meniscus derangements, posterior horn of medial meniscus, left knee 06/28/2017  . Ureteral stone 05/23/2017  . History of sepsis 05/23/2017  . Acute pain of left knee 04/09/2017  . Trochanteric bursitis, right hip 09/27/2016  . GAD (generalized anxiety disorder) 05/31/2014  . Rectocele 08/21/2011  .  Uterovaginal prolapse 08/21/2011  . Dyspnea 10/13/2010  . Hypertension 10/13/2010    Ladaysha Soutar, PTA 10/27/2019, 2:46 PM  [facility] [address], [place], [place], [address] Phone: [phone]   Fax:  [phone]  Name: Alonah Lineback MRN: [id number] Date of Birth: 09-May-1966

## 2019-11-03 ENCOUNTER — Ambulatory Visit: Payer: PRIVATE HEALTH INSURANCE | Admitting: Physical Therapy

## 2019-11-03 ENCOUNTER — Other Ambulatory Visit: Payer: Self-pay

## 2019-11-03 ENCOUNTER — Encounter: Payer: Self-pay | Admitting: Physical Therapy

## 2019-11-03 DIAGNOSIS — R293 Abnormal posture: Secondary | ICD-10-CM

## 2019-11-03 DIAGNOSIS — R2681 Unsteadiness on feet: Secondary | ICD-10-CM

## 2019-11-03 DIAGNOSIS — M6281 Muscle weakness (generalized): Secondary | ICD-10-CM

## 2019-11-03 DIAGNOSIS — R2689 Other abnormalities of gait and mobility: Secondary | ICD-10-CM

## 2019-11-03 DIAGNOSIS — M25562 Pain in left knee: Secondary | ICD-10-CM

## 2019-11-03 DIAGNOSIS — M25662 Stiffness of left knee, not elsewhere classified: Secondary | ICD-10-CM

## 2019-11-03 NOTE — Therapy (Signed)
[facility] [address] [place], [place], [address] Phone: [phone]   Fax:  [phone]  Physical Therapy Treatment  Patient Details  Name: Sharon [name] MRN: [id number] Date of Birth: Mar 24, 1966 Referring Provider (PT): Jean Rosenthal, MD   Encounter Date: 11/03/2019  PT End of Session - 11/03/19 1458    Visit Number  64    Date for PT Re-Evaluation  11/17/19    Authorization Type  Medcost - 30 visits beginning 2021 - track visits below    Authorization Time Period  MET deductible,  100% covered for 2020    PT Start Time  1452    PT Stop Time  1530    PT Time Calculation (min)  38 min    Activity Tolerance  Patient tolerated treatment well    Behavior During Therapy  [facility] for tasks assessed/performed       Past Medical History:  Diagnosis Date  . Allergy   . Anal fissure   . Anemia    borderline  . Anxiety   . Arthritis   . Blood clot in vein    left leg  . Detached retina    BOTH SCLERA BUCKLE BOTH EYES  . DVT (deep venous thrombosis) (HCC)    left leg  . Endometriosis   . Family history of adverse reaction to anesthesia    DAUGHTER POST OP PONV  . GERD (gastroesophageal reflux disease)   . Glaucoma    RIGHT WORST THAN LEFT  . Heart murmur    "caused by anxiety"   . History of hemorrhoids   . History of kidney stones   . Hypertension   . IBS (irritable bowel syndrome)   . Perforated eardrum, right    SMALL HOLE  . PONV (postoperative nausea and vomiting)   . Pulmonary embolism ([place]) 6 YRS AGO    Past Surgical History:  Procedure Laterality Date  . ABDOMINAL HYSTERECTOMY    . CATARACT EXTRACTION Bilateral 2015  . COLONOSCOPY    . CYSTOSCOPY W/ URETERAL STENT PLACEMENT Right 05/23/2017   Procedure: CYSTOSCOPY WITH RETROGRADE PYELOGRAM/URETERAL RIGHT STENT PLACEMENT;  Surgeon: Bjorn Loser, MD;  [facility];  Service: Urology;  Laterality: Right;  . CYSTOSCOPY W/  URETERAL STENT PLACEMENT Right 06/04/2017   Procedure: CYSTOSCOPY WITHRIGHT RETROGRADE PYELOGRAMRIGHT /URETEREOSCOPY  STENT PLACEMENT;  Surgeon: Lucas Mallow, MD;  Location: [facility];  Service: Urology;  Laterality: Right;  . ENDOMETRIAL ABLATION    . EYE SURGERY Bilateral    cataract surgery with lens implants  . [place]  2011  . INCONTINENCE SURGERY    . INNER EAR SURGERY     X 2  . KNEE SURGERY    . RECTAL PROLAPSE REPAIR    . RETINAL DETACHMENT SURGERY Bilateral 2000  . RETINAL DETACHMENT SURGERY Bilateral   . TOTAL KNEE ARTHROPLASTY Left 04/29/2019  . TOTAL KNEE ARTHROPLASTY Left 04/29/2019   Procedure: LEFT TOTAL KNEE ARTHROPLASTY;  Surgeon: Mcarthur Rossetti, MD;  Location: [place];  Service: Orthopedics;  Laterality: Left;  . TUBAL LIGATION    . VEIN SURGERY Left 04/2010    There were no vitals filed for this visit.  Subjective Assessment - 11/03/19 1500    Subjective  I worked a lot in the yard yesterday so I am sore and stiff.    Pertinent History  right shoulder impingement, cervicalgia, right Trochanteric bursitis, dyspnea, HTN,    Currently in Pain?  Yes  Pain Score  5     Pain Location  Knee    Pain Orientation  Left    Pain Descriptors / Indicators  Tightness;Sore         [facility] PT Assessment - 11/03/19 0001      PROM   Left Knee Flexion  118                    [facility] Adult PT Treatment/Exercise - 11/03/19 0001      Knee/Hip Exercises: Seated   Long Arc Quad  Strengthening;Left;3 sets;10 reps;Weights    Long Arc Quad Weight  4 lbs.      Manual Therapy   Joint Mobilization  Lt hip posterior mobs intermittently when stetching the knee in supine    Passive ROM  Prone knee flexion PROM and static stretch , prone knee extension stretching   Seated knee flexion static stretching                 PT Long Term Goals - 09/10/19 1428      PT LONG TERM GOAL #1   Title  PROM Left knee flexion 120  degrees    Baseline  117 (09/10/19)    Status  On-going            Plan - 11/03/19 1459    Clinical Impression Statement  Pt was able to complete 4# knee extensions today and 118 degrees of PROM knee flexion.    Personal Factors and Comorbidities  Profession    Comorbidities  right shoulder impingement, cervicalgia, right Trochanteric bursitis, dyspnea, HTN    Examination-Activity Limitations  Bend;Lift;Locomotion Level;Stand;Stairs;Transfers    Examination-Participation Restrictions  Community Activity    Stability/Clinical Decision Making  Stable/Uncomplicated    Rehab Potential  Excellent    PT Frequency  2x / week    PT Duration  12 weeks    PT Treatment/Interventions  ADLs/Self Care Home Management;Electrical Stimulation;Moist Heat;DME Instruction;Gait training;Stair training;Functional mobility training;Therapeutic activities;Therapeutic exercise;Balance training;Neuromuscular re-education;Patient/family education;Manual techniques;Scar mobilization;Compression bandaging;Passive range of motion;Dry needling;Taping;Vasopneumatic Device;Joint Manipulations    PT Next Visit Plan  Continue with manual techniques for increased knee ROM: Stairs    PT Home Exercise Plan  [facility]    Consulted and Agree with Plan of Care  Patient       Patient will benefit from skilled therapeutic intervention in order to improve the following deficits and impairments:  Abnormal gait, Decreased activity tolerance, Decreased balance, Decreased endurance, Decreased mobility, Decreased range of motion, Decreased skin integrity, Decreased scar mobility, Decreased strength, Increased edema, Impaired flexibility, Postural dysfunction, Pain  Visit Diagnosis: Unsteadiness on feet  Other abnormalities of gait and mobility  Chronic pain of left knee  Stiffness of left knee, not elsewhere classified  Abnormal posture  Muscle weakness     Problem List Patient Active Problem List   Diagnosis Date  Noted  . Status post total left knee replacement 04/29/2019  . Impingement syndrome of right shoulder 10/28/2018  . Chronic pain of left knee 03/27/2018  . Chronic right shoulder pain 03/27/2018  . Cervicalgia 03/27/2018  . Exposure of implanted vaginal mesh ([place]) 03/06/2018  . Outlet dysfunction constipation 03/06/2018  . Status post arthroscopy of left knee 08/06/2017  . Unilateral primary osteoarthritis, left knee 08/06/2017  . Tremor 07/10/2017  . Other meniscus derangements, posterior horn of medial meniscus, left knee 06/28/2017  . Ureteral stone 05/23/2017  . History of sepsis 05/23/2017  . Acute pain of left knee 04/09/2017  . Trochanteric  bursitis, right hip 09/27/2016  . GAD (generalized anxiety disorder) 05/31/2014  . Rectocele 08/21/2011  . Uterovaginal prolapse 08/21/2011  . Dyspnea 10/13/2010  . Hypertension 10/13/2010    Charnay Nazario, PTA 11/03/2019, 3:32 PM  [facility] [address] [place], [place], [address] Phone: [phone]   Fax:  [phone]  Name: Alonda Weaber MRN: [id number] Date of Birth: 01-27-1966

## 2019-11-04 ENCOUNTER — Telehealth: Payer: Self-pay | Admitting: Radiology

## 2019-11-04 NOTE — Telephone Encounter (Signed)
Find out what ever work restrictions she wishes to have stated and we can have any work restrictions that she would like.

## 2019-11-04 NOTE — Telephone Encounter (Signed)
Calling to get carnification for work restrictions please call her back, states form says that she can not return to work, but then also states that she can't lift greater than 10lbs,  Per Dr. Karren Cobble she doesn't have to lift anything at work.  She would like a call back to get clarification on this.

## 2019-11-05 ENCOUNTER — Telehealth: Payer: Self-pay | Admitting: Radiology

## 2019-11-05 ENCOUNTER — Ambulatory Visit: Payer: PRIVATE HEALTH INSURANCE | Admitting: Physical Therapy

## 2019-11-05 ENCOUNTER — Encounter: Payer: Self-pay | Admitting: Physical Therapy

## 2019-11-05 ENCOUNTER — Other Ambulatory Visit: Payer: Self-pay

## 2019-11-05 DIAGNOSIS — G8929 Other chronic pain: Secondary | ICD-10-CM

## 2019-11-05 DIAGNOSIS — R2689 Other abnormalities of gait and mobility: Secondary | ICD-10-CM

## 2019-11-05 DIAGNOSIS — R293 Abnormal posture: Secondary | ICD-10-CM

## 2019-11-05 DIAGNOSIS — R2681 Unsteadiness on feet: Secondary | ICD-10-CM

## 2019-11-05 DIAGNOSIS — M6281 Muscle weakness (generalized): Secondary | ICD-10-CM

## 2019-11-05 DIAGNOSIS — M25662 Stiffness of left knee, not elsewhere classified: Secondary | ICD-10-CM

## 2019-11-05 NOTE — Telephone Encounter (Signed)
LMOM for patient to call back and leave me a message how she would like her note written and I will re-write it

## 2019-11-05 NOTE — Telephone Encounter (Signed)
Sharon [name] is calling to see if our office rec'd the faxe regarding Sharon [name] yesterday.  Please call him back to advise @ [phone] at ext [phone].

## 2019-11-05 NOTE — Therapy (Signed)
[facility] [address] [place], [place], [address] Phone: [phone]   Fax:  [phone]  Physical Therapy Treatment  Patient Details  Name: Sharon [name] MRN: [id number] Date of Birth: 1966/02/15 Referring Provider (PT): Jean Rosenthal, MD   Encounter Date: 11/05/2019  PT End of Session - 11/05/19 1458    Visit Number  [id number]    Date for PT Re-Evaluation  11/17/19    PT Start Time  1450    PT Stop Time  1523    PT Time Calculation (min)  33 min    Activity Tolerance  Patient tolerated treatment well    Behavior During Therapy  [employer] for tasks assessed/performed       Past Medical History:  Diagnosis Date  . Allergy   . Anal fissure   . Anemia    borderline  . Anxiety   . Arthritis   . Blood clot in vein    left leg  . Detached retina    BOTH SCLERA BUCKLE BOTH EYES  . DVT (deep venous thrombosis) (HCC)    left leg  . Endometriosis   . Family history of adverse reaction to anesthesia    DAUGHTER POST OP PONV  . GERD (gastroesophageal reflux disease)   . Glaucoma    RIGHT WORST THAN LEFT  . Heart murmur    "caused by anxiety"   . History of hemorrhoids   . History of kidney stones   . Hypertension   . IBS (irritable bowel syndrome)   . Perforated eardrum, right    SMALL HOLE  . PONV (postoperative nausea and vomiting)   . Pulmonary embolism ([place]) 6 YRS AGO    Past Surgical History:  Procedure Laterality Date  . ABDOMINAL HYSTERECTOMY    . CATARACT EXTRACTION Bilateral 2015  . COLONOSCOPY    . CYSTOSCOPY W/ URETERAL STENT PLACEMENT Right 05/23/2017   Procedure: CYSTOSCOPY WITH RETROGRADE PYELOGRAM/URETERAL RIGHT STENT PLACEMENT;  Surgeon: Bjorn Loser, MD;  Location: [facility];  Service: Urology;  Laterality: Right;  . CYSTOSCOPY W/ URETERAL STENT PLACEMENT Right 06/04/2017   Procedure: CYSTOSCOPY WITHRIGHT RETROGRADE PYELOGRAMRIGHT /URETEREOSCOPY  STENT PLACEMENT;  Surgeon: Lucas Mallow, MD;  Location: [facility];  Service: Urology;  Laterality: Right;  . ENDOMETRIAL ABLATION    . EYE SURGERY Bilateral    cataract surgery with lens implants  . [place]  2011  . INCONTINENCE SURGERY    . INNER EAR SURGERY     X 2  . KNEE SURGERY    . RECTAL PROLAPSE REPAIR    . RETINAL DETACHMENT SURGERY Bilateral 2000  . RETINAL DETACHMENT SURGERY Bilateral   . TOTAL KNEE ARTHROPLASTY Left 04/29/2019  . TOTAL KNEE ARTHROPLASTY Left 04/29/2019   Procedure: LEFT TOTAL KNEE ARTHROPLASTY;  Surgeon: Mcarthur Rossetti, MD;  Location: [facility];  Service: Orthopedics;  Laterality: Left;  . TUBAL LIGATION    . VEIN SURGERY Left 04/2010    There were no vitals filed for this visit.  Subjective Assessment - 11/05/19 1500    Subjective  Went to pool. Pain today in knee.    Pertinent History  right shoulder impingement, cervicalgia, right Trochanteric bursitis, dyspnea, HTN,    Multiple Pain Sites  No         [facility] PT Assessment - 11/05/19 0001      PROM   Left Knee Flexion  117  [facility] Adult PT Treatment/Exercise - 11/05/19 0001      Knee/Hip Exercises: Aerobic   Recumbent Bike  L1x 44min knee warm up      Knee/Hip Exercises: Seated   Long Arc Quad  Strengthening;Both;3 sets;10 reps;Weights    Long Arc Quad Weight  5 lbs.      Manual Therapy   Joint Mobilization  Lt hip posterior mobs intermittently when stetching the knee in supine    Passive ROM  Prone knee flexion PROM and static stretch , prone knee extension stretching   Seated knee flexion static stretching                 PT Long Term Goals - 09/10/19 1428      PT LONG TERM GOAL #1   Title  PROM Left knee flexion 120 degrees    Baseline  117 (09/10/19)    Status  On-going            Plan - 11/05/19 1521    Clinical Impression Statement  118 degrees of passive flexion, pt reports pain was limiting factor today.    Personal  Factors and Comorbidities  Profession    Comorbidities  right shoulder impingement, cervicalgia, right Trochanteric bursitis, dyspnea, HTN    Examination-Activity Limitations  Bend;Lift;Locomotion Level;Stand;Stairs;Transfers    Examination-Participation Restrictions  Community Activity    Stability/Clinical Decision Making  Stable/Uncomplicated    Rehab Potential  Excellent    PT Frequency  2x / week    PT Duration  12 weeks    PT Treatment/Interventions  ADLs/Self Care Home Management;Electrical Stimulation;Moist Heat;DME Instruction;Gait training;Stair training;Functional mobility training;Therapeutic activities;Therapeutic exercise;Balance training;Neuromuscular re-education;Patient/family education;Manual techniques;Scar mobilization;Compression bandaging;Passive range of motion;Dry needling;Taping;Vasopneumatic Device;Joint Manipulations    PT Next Visit Plan  Continue with manual techniques for increased knee ROM: Stairs    PT Home Exercise Plan  [facility]       Patient will benefit from skilled therapeutic intervention in order to improve the following deficits and impairments:  Abnormal gait, Decreased activity tolerance, Decreased balance, Decreased endurance, Decreased mobility, Decreased range of motion, Decreased skin integrity, Decreased scar mobility, Decreased strength, Increased edema, Impaired flexibility, Postural dysfunction, Pain  Visit Diagnosis: Unsteadiness on feet  Other abnormalities of gait and mobility  Chronic pain of left knee  Stiffness of left knee, not elsewhere classified  Abnormal posture  Muscle weakness     Problem List Patient Active Problem List   Diagnosis Date Noted  . Status post total left knee replacement 04/29/2019  . Impingement syndrome of right shoulder 10/28/2018  . Chronic pain of left knee 03/27/2018  . Chronic right shoulder pain 03/27/2018  . Cervicalgia 03/27/2018  . Exposure of implanted vaginal mesh ([place]) 03/06/2018  .  Outlet dysfunction constipation 03/06/2018  . Status post arthroscopy of left knee 08/06/2017  . Unilateral primary osteoarthritis, left knee 08/06/2017  . Tremor 07/10/2017  . Other meniscus derangements, posterior horn of medial meniscus, left knee 06/28/2017  . Ureteral stone 05/23/2017  . History of sepsis 05/23/2017  . Acute pain of left knee 04/09/2017  . Trochanteric bursitis, right hip 09/27/2016  . GAD (generalized anxiety disorder) 05/31/2014  . Rectocele 08/21/2011  . Uterovaginal prolapse 08/21/2011  . Dyspnea 10/13/2010  . Hypertension 10/13/2010    Derry Arbogast, PTA 11/05/2019, 3:24 PM  [facility] [address] [place], [place], [address] Phone: [phone]   Fax:  [phone]  Name: Sharon [name] MRN: [id number] Date of Birth: [birth year]

## 2019-11-07 ENCOUNTER — Telehealth: Payer: Self-pay | Admitting: Orthopaedic Surgery

## 2019-11-07 ENCOUNTER — Ambulatory Visit: Payer: Self-pay

## 2019-11-07 NOTE — Telephone Encounter (Signed)
Noted.   FYI to provider  

## 2019-11-07 NOTE — Telephone Encounter (Signed)
Patient called stating that she is SOB all the time for about 2 weeks.  She states that she has had knee replacement and has been in physical therapy. She states that she has had blood clots in the past in her leg and lung. She has no fever.  She has a cough that is dry. Her chest has a heavy feeling. Per protocol patient was told to go to ER for evaluation. She refused so call was placed to office. Office states that she must go to ER or UC for evaluation. I relayed message to patient.  She verbalized understanding and will seek medical care for her symptoms.  Reason for Disposition . History of prior "blood clot" in leg or lungs (i.e., deep vein thrombosis, pulmonary embolism)  Answer Assessment - Initial Assessment Questions 1. RESPIRATORY STATUS: "Describe your breathing?" (e.g., wheezing, shortness of breath, unable to speak, severe coughing)      sob 2. ONSET: "When did this breathing problem begin?"   about 2 weeks 3. PATTERN "Does the difficult breathing come and go, or has it been constant since it started?"      constant 4. SEVERITY: "How bad is your breathing?" (e.g., mild, moderate, severe)    - MILD: No SOB at rest, mild SOB with walking, speaks normally in sentences, can lay down, no retractions, pulse < 100.    - MODERATE: SOB at rest, SOB with minimal exertion and prefers to sit, cannot lie down flat, speaks in phrases, mild retractions, audible wheezing, pulse 100-120.    - SEVERE: Very SOB at rest, speaks in single words, struggling to breathe, sitting hunched forward, retractions, pulse > 120     All the time  moderate 5. RECURRENT SYMPTOM: "Have you had difficulty breathing before?" If so, ask: "When was the last time?" and "What happened that time?"    Yes blood clot in lung 6CARDIAC HISTORY: "Do you have any history of heart disease?" (e.g., heart attack, angina, bypass surgery, angioplasty)     no 7. LUNG HISTORY: "Do you have any history of lung disease?"  (e.g.,  pulmonary embolus, asthma, emphysema)     PE 8. CAUSE: "What do you think is causing the breathing problem?"     unsure 9. OTHER SYMPTOMS: "Do you have any other symptoms? (e.g., dizziness, runny nose, cough, chest pain, fever)     Cough, runny nose no fever 10. PREGNANCY: "Is there any chance you are pregnant?" "When was your last menstrual period?"       N/A  11. TRAVEL: "Have you traveled out of the country in the last month?" (e.g., travel history, exposures)      No  Protocols used: BREATHING DIFFICULTY-A-AH

## 2019-11-07 NOTE — Telephone Encounter (Signed)
Pt called stating she and Caryl Pina has been continuing a conversation and the pt has to update ashley on some info and would like a call back.  [phone]

## 2019-11-07 NOTE — Telephone Encounter (Signed)
Patient states she will just bring the letter from [facility] at her next appt

## 2019-11-08 ENCOUNTER — Emergency Department (HOSPITAL_BASED_OUTPATIENT_CLINIC_OR_DEPARTMENT_OTHER)
Admission: EM | Admit: 2019-11-08 | Discharge: 2019-11-08 | Disposition: A | Discharge status: Home / Self Care | Payer: PRIVATE HEALTH INSURANCE | Attending: Emergency Medicine | Admitting: Emergency Medicine

## 2019-11-08 ENCOUNTER — Other Ambulatory Visit: Payer: Self-pay

## 2019-11-08 ENCOUNTER — Emergency Department (HOSPITAL_BASED_OUTPATIENT_CLINIC_OR_DEPARTMENT_OTHER): Payer: PRIVATE HEALTH INSURANCE

## 2019-11-08 ENCOUNTER — Encounter (HOSPITAL_BASED_OUTPATIENT_CLINIC_OR_DEPARTMENT_OTHER): Payer: Self-pay | Admitting: Emergency Medicine

## 2019-11-08 DIAGNOSIS — Z79899 Other long term (current) drug therapy: Secondary | ICD-10-CM | POA: Insufficient documentation

## 2019-11-08 DIAGNOSIS — Z888 Allergy status to other drugs, medicaments and biological substances status: Secondary | ICD-10-CM | POA: Diagnosis not present

## 2019-11-08 DIAGNOSIS — Z86718 Personal history of other venous thrombosis and embolism: Secondary | ICD-10-CM | POA: Diagnosis not present

## 2019-11-08 DIAGNOSIS — Z885 Allergy status to narcotic agent status: Secondary | ICD-10-CM | POA: Diagnosis not present

## 2019-11-08 DIAGNOSIS — R0789 Other chest pain: Secondary | ICD-10-CM | POA: Insufficient documentation

## 2019-11-08 DIAGNOSIS — R0602 Shortness of breath: Secondary | ICD-10-CM | POA: Diagnosis not present

## 2019-11-08 DIAGNOSIS — I10 Essential (primary) hypertension: Secondary | ICD-10-CM | POA: Diagnosis not present

## 2019-11-08 DIAGNOSIS — Z880 Allergy status to penicillin: Secondary | ICD-10-CM | POA: Diagnosis not present

## 2019-11-08 LAB — CBC WITH DIFFERENTIAL/PLATELET
Abs Immature Granulocytes: 0.01 10*3/uL (ref 0.00–0.07)
Basophils Absolute: 0.1 10*3/uL (ref 0.0–0.1)
Basophils Relative: 1 %
Eosinophils Absolute: 0.1 10*3/uL (ref 0.0–0.5)
Eosinophils Relative: 1 %
HCT: 40.6 % (ref 36.0–46.0)
Hemoglobin: 13.6 g/dL (ref 12.0–15.0)
Immature Granulocytes: 0 %
Lymphocytes Relative: 21 %
Lymphs Abs: 1.5 10*3/uL (ref 0.7–4.0)
MCH: 30.9 pg (ref 26.0–34.0)
MCHC: 33.5 g/dL (ref 30.0–36.0)
MCV: 92.3 fL (ref 80.0–100.0)
Monocytes Absolute: 0.5 10*3/uL (ref 0.1–1.0)
Monocytes Relative: 7 %
Neutro Abs: 4.9 10*3/uL (ref 1.7–7.7)
Neutrophils Relative %: 70 %
Platelets: 296 10*3/uL (ref 150–400)
RBC: 4.4 MIL/uL (ref 3.87–5.11)
RDW: 12.2 % (ref 11.5–15.5)
WBC: 7 10*3/uL (ref 4.0–10.5)
nRBC: 0 % (ref 0.0–0.2)

## 2019-11-08 LAB — BASIC METABOLIC PANEL
Anion gap: 10 (ref 5–15)
BUN: 21 mg/dL — ABNORMAL HIGH (ref 6–20)
CO2: 27 mmol/L (ref 22–32)
Calcium: 9.8 mg/dL (ref 8.9–10.3)
Chloride: 101 mmol/L (ref 98–111)
Creatinine, Ser: 0.84 mg/dL (ref 0.44–1.00)
GFR calc Af Amer: >60 mL/min (ref >60)
GFR calc non Af Amer: >60 mL/min (ref >60)
Glucose, Bld: 112 mg/dL — ABNORMAL HIGH (ref 70–99)
Potassium: 4.4 mmol/L (ref 3.5–5.1)
Sodium: 138 mmol/L (ref 135–145)

## 2019-11-08 LAB — TROPONIN I (HIGH SENSITIVITY): Troponin I (High Sensitivity): 3 ng/L (ref <18)

## 2019-11-08 MED ORDER — IOHEXOL 350 MG/ML SOLN
100.0000 mL | Freq: Once | INTRAVENOUS | Status: AC | PRN
  Administered 2019-11-08: 74 mL via INTRAVENOUS

## 2019-11-08 NOTE — Discharge Instructions (Signed)
Recommend recheck with your primary doctor regarding the symptoms you are experiencing today.  If you develop worsening pain, shortness of breath, passing out or other new concerning symptom, recommend return to ER for reassessment.

## 2019-11-08 NOTE — ED Provider Notes (Signed)
[facility] EMERGENCY DEPARTMENT Provider Note   CSN: [id number] Arrival date & time: 11/08/19  [id number]     History Chief Complaint  Patient presents with  . Shortness of Breath    Sharon [name] is a 54 y.o. female.  Presents here with chief complaint of shortness of breath.  Patient reports that she chronically has shortness of breath, fatigue however it seems to be worse over the past couple weeks.  Occurring at rest and with exertion.  Has chest burning sensation occasionally.  Not associated with meals, not worse with lying flat, not worse at night.  States that when she had similar symptoms previously she was told that she has gastric reflux and was started on PPI.  Denies associated abdominal pain.  Reports father diagnosed with heart disease around age 79, mother also with heart disease, passed in her 52s.  Reports past medical history of DVT/PE.  No longer on anticoagulation.  Knee replacement surgery in November.  Currently going through PT.  Chart review, 2011 CTA chest demonstrating small nonocclusive pulmonary emboli.    HPI     Past Medical History:  Diagnosis Date  . Allergy   . Anal fissure   . Anemia    borderline  . Anxiety   . Arthritis   . Blood clot in vein    left leg  . Detached retina    BOTH SCLERA BUCKLE BOTH EYES  . DVT (deep venous thrombosis) (HCC)    left leg  . Endometriosis   . Family history of adverse reaction to anesthesia    DAUGHTER POST OP PONV  . GERD (gastroesophageal reflux disease)   . Glaucoma    RIGHT WORST THAN LEFT  . Heart murmur    "caused by anxiety"   . History of hemorrhoids   . History of kidney stones   . Hypertension   . IBS (irritable bowel syndrome)   . Perforated eardrum, right    SMALL HOLE  . PONV (postoperative nausea and vomiting)   . Pulmonary embolism ([place]) 6 YRS AGO    Patient Active Problem List   Diagnosis Date Noted  . Status post total left knee replacement 04/29/2019  .  Impingement syndrome of right shoulder 10/28/2018  . Chronic pain of left knee 03/27/2018  . Chronic right shoulder pain 03/27/2018  . Cervicalgia 03/27/2018  . Exposure of implanted vaginal mesh ([place]) 03/06/2018  . Outlet dysfunction constipation 03/06/2018  . Status post arthroscopy of left knee 08/06/2017  . Unilateral primary osteoarthritis, left knee 08/06/2017  . Tremor 07/10/2017  . Other meniscus derangements, posterior horn of medial meniscus, left knee 06/28/2017  . Ureteral stone 05/23/2017  . History of sepsis 05/23/2017  . Acute pain of left knee 04/09/2017  . Trochanteric bursitis, right hip 09/27/2016  . GAD (generalized anxiety disorder) 05/31/2014  . Rectocele 08/21/2011  . Uterovaginal prolapse 08/21/2011  . Dyspnea 10/13/2010  . Hypertension 10/13/2010    Past Surgical History:  Procedure Laterality Date  . ABDOMINAL HYSTERECTOMY    . CATARACT EXTRACTION Bilateral 2015  . COLONOSCOPY    . CYSTOSCOPY W/ URETERAL STENT PLACEMENT Right 05/23/2017   Procedure: CYSTOSCOPY WITH RETROGRADE PYELOGRAM/URETERAL RIGHT STENT PLACEMENT;  Surgeon: Bjorn Loser, MD;  Location: [facility];  Service: Urology;  Laterality: Right;  . CYSTOSCOPY W/ URETERAL STENT PLACEMENT Right 06/04/2017   Procedure: CYSTOSCOPY WITHRIGHT RETROGRADE PYELOGRAMRIGHT /URETEREOSCOPY  STENT PLACEMENT;  Surgeon: Lucas Mallow, MD;  Location: [facility];  Service: Urology;  Laterality: Right;  . ENDOMETRIAL ABLATION    . EYE SURGERY Bilateral    cataract surgery with lens implants  . [place]  2011  . INCONTINENCE SURGERY    . INNER EAR SURGERY     X 2  . KNEE SURGERY    . RECTAL PROLAPSE REPAIR    . RETINAL DETACHMENT SURGERY Bilateral 2000  . RETINAL DETACHMENT SURGERY Bilateral   . TOTAL KNEE ARTHROPLASTY Left 04/29/2019  . TOTAL KNEE ARTHROPLASTY Left 04/29/2019   Procedure: LEFT TOTAL KNEE ARTHROPLASTY;  Surgeon: Mcarthur Rossetti, MD;  Location: [facility];   Service: Orthopedics;  Laterality: Left;  . TUBAL LIGATION    . VEIN SURGERY Left 04/2010     OB History   No obstetric history on file.     Family History  Problem Relation Age of Onset  . Emphysema Mother        smoker  . Allergies Mother   . Asthma Mother   . Heart disease Mother        AMI as cause of death  . COPD Mother   . Asthma Father   . Heart disease Father        AMI as cause of death  . Diabetes Father   . Asthma Brother   . Heart disease Brother 66       heart failure  . Lung cancer Maternal Grandfather        was a smoker  . Cancer Maternal Grandfather        lung  . Heart disease Paternal Grandmother   . Heart disease Paternal Grandfather   . Colon cancer Neg Hx     Social History   Tobacco Use  . Smoking status: Never Smoker  . Smokeless tobacco: Never Used  Substance Use Topics  . Alcohol use: Not Currently    Comment: occ  . Drug use: No    Comment: former maryjuana use- quit in [place] Medications Prior to Admission medications   Medication Sig Start Date End Date Taking? Authorizing Provider  acetaminophen (TYLENOL) 500 MG tablet Take 1,000 mg by mouth every 8 (eight) hours as needed for moderate pain.     [provider]  acyclovir (ZOVIRAX) 400 MG tablet Take 1 tablet (400 mg total) by mouth at bedtime. 01/01/18   Forrest Moron, MD  Ascorbic Acid (VITAMIN C) 1000 MG tablet Take 1,000 mg by mouth daily.    [provider]  ASPIRIN LOW DOSE 81 MG chewable tablet CHEW 1 TABLET (81 MG TOTAL) BY MOUTH 2 (TWO) TIMES DAILY. 05/09/19   Mcarthur Rossetti, MD  BYSTOLIC 5 MG tablet TAKE 1 TABLET BY MOUTH EVERY DAY 07/09/19   Delia Chimes A, MD  celecoxib (CELEBREX) 200 MG capsule TAKE 1 CAPSULE BY MOUTH TWICE A DAY 06/16/19   Mcarthur Rossetti, MD  Cholecalciferol (VITAMIN D) 2000 UNITS tablet Take 2,000 Units by mouth daily.    [provider]  clonazePAM (KLONOPIN) 0.5 MG tablet Take 1 tablet (0.5 mg  total) by mouth at bedtime. 10/27/19   Delia Chimes A, MD  EPINEPHrine 0.3 mg/0.3 mL IJ SOAJ injection Inject 0.3 mLs (0.3 mg total) into the muscle once as needed (For anaphylaxis.). Patient taking differently: Inject 0.3 mg into the muscle once as needed for anaphylaxis.  01/20/19   Forrest Moron, MD  HYDROcodone-acetaminophen (NORCO/VICODIN) 5-325 MG tablet Take 1-2 tablets by mouth 3 (three) times daily as needed for moderate  pain. 09/11/19   Mcarthur Rossetti, MD  ibuprofen (ADVIL,MOTRIN) 200 MG tablet Take 400 mg by mouth 2 (two) times daily.  04/25/18   [provider]  Magnesium 500 MG CAPS Take 500 mg by mouth daily.     [provider]  methocarbamol (ROBAXIN) 500 MG tablet Take 1 tablet (500 mg total) by mouth every 6 (six) hours as needed for muscle spasms. 07/04/19   Mcarthur Rossetti, MD  Multiple Vitamin (MULTI-VITAMINS) TABS Take 1 tablet by mouth daily.     [provider]  nabumetone (RELAFEN) 750 MG tablet Take 1 tablet (750 mg total) by mouth 2 (two) times daily as needed. 07/10/19   Mcarthur Rossetti, MD  Nitroglycerin (RECTIV) 0.4 % OINT Place 0.4 inches rectally 2 (two) times daily as needed (For anal fissures.). 01/01/18   Forrest Moron, MD  omeprazole (PRILOSEC) 20 MG capsule Take 1 capsule (20 mg total) by mouth 2 (two) times daily before a meal. 06/09/19   Stallings, Zoe A, MD  ondansetron (ZOFRAN ODT) 4 MG disintegrating tablet Take 1 tablet (4 mg total) by mouth every 8 (eight) hours as needed for nausea or vomiting. 05/01/19   Mcarthur Rossetti, MD  ondansetron (ZOFRAN) 8 MG tablet Take 1 tablet (8 mg total) by mouth every 8 (eight) hours as needed for nausea or vomiting. 05/05/19   Mcarthur Rossetti, MD  Polyethylene Glycol 3350 (MIRALAX PO) Take 17 g by mouth daily as needed (constipation).     [provider]  primidone (MYSOLINE) 50 MG tablet TAKE 3 TABLETS BY MOUTH EVERY DAY 10/24/19   Tomi Likens, Adam R, DO   senna-docusate (SENOKOT-S) 8.6-50 MG tablet Take 1 tablet by mouth daily as needed for moderate constipation.  06/05/18   [provider]  tiZANidine (ZANAFLEX) 4 MG tablet Take 1 tablet (4 mg total) by mouth every 8 (eight) hours as needed for muscle spasms. 09/11/19   Mcarthur Rossetti, MD  Travoprost, BAK Free, (TRAVATAN) 0.004 % SOLN ophthalmic solution INSTILL 1 DROP INTO BOTH EYES AT BEDTIME 07/09/19   Forrest Moron, MD  vitamin B-12 (CYANOCOBALAMIN) 1000 MCG tablet Take 1 tablet (1,000 mcg total) by mouth daily. 01/20/19   Forrest Moron, MD  zinc gluconate 50 MG tablet Take 50 mg by mouth daily.    [provider]    Allergies    Penicillins, Dextromethorphan polistirex er, Diflucan [fluconazole], Hydrocodone, Oxycodone-acetaminophen, and Tape  Review of Systems   Review of Systems  Constitutional: Negative for chills and fever.  HENT: Negative for ear pain and sore throat.   Eyes: Negative for pain and visual disturbance.  Respiratory: Positive for shortness of breath. Negative for cough.   Cardiovascular: Positive for chest pain. Negative for palpitations.  Gastrointestinal: Negative for abdominal pain and vomiting.  Genitourinary: Negative for dysuria and hematuria.  Musculoskeletal: Negative for arthralgias and back pain.  Skin: Negative for color change and rash.  Neurological: Negative for seizures and syncope.  All other systems reviewed and are negative.   Physical Exam Updated Vital Signs BP 115/76   Pulse 70   Temp 98.2 F (36.8 C) (Oral)   Resp 10   Ht 5\' 6"  (1.676 m)   Wt 65.3 kg   LMP 10/11/2010   SpO2 99%   BMI 23.24 kg/m   Physical Exam Vitals and nursing note reviewed.  Constitutional:      General: She is not in acute distress.    Appearance: She is  well-developed.  HENT:     Head: Normocephalic and atraumatic.  Eyes:     Conjunctiva/sclera: Conjunctivae normal.  Cardiovascular:     Rate and Rhythm: Normal rate and  regular rhythm.     Heart sounds: No murmur.  Pulmonary:     Effort: Pulmonary effort is normal. No respiratory distress.     Breath sounds: Normal breath sounds. No decreased breath sounds.  Chest:     Chest wall: No mass, deformity, tenderness or crepitus.  Abdominal:     Palpations: Abdomen is soft.     Tenderness: There is no abdominal tenderness.  Musculoskeletal:     Cervical back: Neck supple.     Right lower leg: No tenderness. No edema.     Left lower leg: No tenderness. No edema.  Skin:    General: Skin is warm and dry.     Capillary Refill: Capillary refill takes less than 2 seconds.  Neurological:     General: No focal deficit present.     Mental Status: She is alert and oriented to person, place, and time.     ED Results / Procedures / Treatments   Labs (all labs ordered are listed, but only abnormal results are displayed) Labs Reviewed  BASIC METABOLIC PANEL - Abnormal; Notable for the following components:      Result Value   Glucose, Bld 112 (*)    BUN 21 (*)    All other components within normal limits  CBC WITH DIFFERENTIAL/PLATELET  TROPONIN I (HIGH SENSITIVITY)    EKG EKG Interpretation  Date/Time:  Saturday Nov 08 2019 07:13:50 EDT Ventricular Rate:  79 PR Interval:    QRS Duration: 90 QT Interval:  361 QTC Calculation: 414 R Axis:   61 Text Interpretation: Sinus rhythm Confirmed by Madalyn Rob [phone]) on 11/08/2019 7:18:37 AM   Radiology CT Angio Chest PE W and/or Wo Contrast  Result Date: 11/08/2019 CLINICAL DATA:  54 year old female with a history of shortness of breath and history of PE EXAM: CT ANGIOGRAPHY CHEST WITH CONTRAST TECHNIQUE: Multidetector CT imaging of the chest was performed using the standard protocol during bolus administration of intravenous contrast. Multiplanar CT image reconstructions and MIPs were obtained to evaluate the vascular anatomy. CONTRAST:  16mL OMNIPAQUE IOHEXOL 350 MG/ML SOLN COMPARISON:  02/09/2010  FINDINGS: Cardiovascular: Heart: No cardiomegaly. No pericardial fluid/thickening. No significant coronary calcifications. Aorta: Unremarkable course, caliber, contour of the thoracic aorta. No aneurysm or dissection flap. No periaortic fluid. Pulmonary arteries: No central, lobar, segmental, or proximal subsegmental filling defects. Mediastinum/Nodes: No mediastinal adenopathy. Unremarkable appearance of the thoracic esophagus. Unremarkable appearance of the thoracic inlet and thyroid. Lungs/Pleura: Central airways are clear. No pleural effusion. No confluent airspace disease. No pneumothorax. Upper Abdomen: No acute. Musculoskeletal: Accentuated kyphotic curvature. No acute displaced fracture. No bony canal narrowing. Review of the MIP images confirms the above findings. IMPRESSION: CT negative for pulmonary emboli, and negative for other acute finding. Electronically Signed   By: Corrie Mckusick D.O.   On: 11/08/2019 09:12    Procedures Procedures (including critical care time)  Medications Ordered in ED Medications  iohexol (OMNIPAQUE) 350 MG/ML injection 100 mL (74 mLs Intravenous Contrast Given 11/08/19 0826)    ED Course  I have reviewed the triage vital signs and the nursing notes.  Pertinent labs & imaging results that were available during my care of the patient were reviewed by me and considered in my medical decision making (see chart for details).    MDM Rules/Calculators/A&P  54 year old lady presenting to ER with complaint of fatigue, shortness of breath.  On exam, patient noted to be well-appearing with normal vital signs.  Reported positive family history CAD by father in 65s, mother in 34s.  However, very atypical description of symptoms for any cardiac concern, EKG without ischemic change, troponin within normal limits, symptoms ongoing for weeks.  Therefore, doubt acute MI, CAD as etiology of symptoms.  Given her reported history of pulmonary emboli, no longer  on anticoagulation, recent surgery, high risk for PE, proceeded with CTA to evaluate further.  Negative for pulmonary embolism, negative for PTX or pneumonia.  CBC and BMP were grossly within normal limits.  Given extensive work-up that was completed today, and exact etiology for her shortness of breath has not been identified.  Considered possible reflux, already on PPI.  Given work-up, current well appearance with stable vital signs, believe she is appropriate for discharge and outpatient management at this time.  Strongly recommended follow-up with primary doctor.    After the discussed management above, the patient was determined to be safe for discharge.  The patient was in agreement with this plan and all questions regarding their care were answered.  ED return precautions were discussed and the patient will return to the ED with any significant worsening of condition.  Final Clinical Impression(s) / ED Diagnoses Final diagnoses:  SOB (shortness of breath)    Rx / DC Orders ED Discharge Orders    None       Lucrezia Starch, MD 11/08/19 1030

## 2019-11-08 NOTE — ED Notes (Signed)
Patient transported to CT 

## 2019-11-08 NOTE — ED Triage Notes (Signed)
SOB x 2 weeks.

## 2019-11-10 ENCOUNTER — Other Ambulatory Visit: Payer: Self-pay

## 2019-11-10 ENCOUNTER — Ambulatory Visit: Payer: PRIVATE HEALTH INSURANCE | Admitting: Physical Therapy

## 2019-11-10 ENCOUNTER — Encounter: Payer: Self-pay | Admitting: Physical Therapy

## 2019-11-10 DIAGNOSIS — M25662 Stiffness of left knee, not elsewhere classified: Secondary | ICD-10-CM

## 2019-11-10 DIAGNOSIS — R2681 Unsteadiness on feet: Secondary | ICD-10-CM

## 2019-11-10 DIAGNOSIS — M6281 Muscle weakness (generalized): Secondary | ICD-10-CM

## 2019-11-10 DIAGNOSIS — R2689 Other abnormalities of gait and mobility: Secondary | ICD-10-CM

## 2019-11-10 DIAGNOSIS — R293 Abnormal posture: Secondary | ICD-10-CM

## 2019-11-10 DIAGNOSIS — M25562 Pain in left knee: Secondary | ICD-10-CM

## 2019-11-10 NOTE — Therapy (Signed)
[facility] [address] [place], [place], [address] Phone: [phone]   Fax:  [phone]  Physical Therapy Treatment  Patient Details  Name: Sharon [name] MRN: [id number] Date of Birth: 07/23/1965 Referring Provider (PT): Jean Rosenthal, MD   Encounter Date: 11/10/2019  PT End of Session - 11/10/19 1450    Visit Number  [id number]    Date for PT Re-Evaluation  11/17/19    Authorization Type  [employer] - 30 visits beginning 2021 - track visits below    Authorization Time Period  MET deductible,  100% covered for 2020    PT Start Time  1450    PT Stop Time  1524    PT Time Calculation (min)  34 min    Activity Tolerance  Patient tolerated treatment well    Behavior During Therapy  [facility] for tasks assessed/performed       Past Medical History:  Diagnosis Date  . Allergy   . Anal fissure   . Anemia    borderline  . Anxiety   . Arthritis   . Blood clot in vein    left leg  . Detached retina    BOTH SCLERA BUCKLE BOTH EYES  . DVT (deep venous thrombosis) (HCC)    left leg  . Endometriosis   . Family history of adverse reaction to anesthesia    DAUGHTER POST OP PONV  . GERD (gastroesophageal reflux disease)   . Glaucoma    RIGHT WORST THAN LEFT  . Heart murmur    "caused by anxiety"   . History of hemorrhoids   . History of kidney stones   . Hypertension   . IBS (irritable bowel syndrome)   . Perforated eardrum, right    SMALL HOLE  . PONV (postoperative nausea and vomiting)   . Pulmonary embolism ([place]) 6 YRS AGO    Past Surgical History:  Procedure Laterality Date  . ABDOMINAL HYSTERECTOMY    . CATARACT EXTRACTION Bilateral 2015  . COLONOSCOPY    . CYSTOSCOPY W/ URETERAL STENT PLACEMENT Right 05/23/2017   Procedure: CYSTOSCOPY WITH RETROGRADE PYELOGRAM/URETERAL RIGHT STENT PLACEMENT;  Surgeon: Bjorn Loser, MD;  Location: [facility];  Service: Urology;  Laterality: Right;  . CYSTOSCOPY W/  URETERAL STENT PLACEMENT Right 06/04/2017   Procedure: CYSTOSCOPY WITHRIGHT RETROGRADE PYELOGRAMRIGHT /URETEREOSCOPY  STENT PLACEMENT;  Surgeon: Lucas Mallow, MD;  Location: [facility];  Service: Urology;  Laterality: Right;  . ENDOMETRIAL ABLATION    . EYE SURGERY Bilateral    cataract surgery with lens implants  . [place]  2011  . INCONTINENCE SURGERY    . INNER EAR SURGERY     X 2  . KNEE SURGERY    . RECTAL PROLAPSE REPAIR    . RETINAL DETACHMENT SURGERY Bilateral 2000  . RETINAL DETACHMENT SURGERY Bilateral   . TOTAL KNEE ARTHROPLASTY Left 04/29/2019  . TOTAL KNEE ARTHROPLASTY Left 04/29/2019   Procedure: LEFT TOTAL KNEE ARTHROPLASTY;  Surgeon: Mcarthur Rossetti, MD;  Location: [facility];  Service: Orthopedics;  Laterality: Left;  . TUBAL LIGATION    . VEIN SURGERY Left 04/2010    There were no vitals filed for this visit.  Subjective Assessment - 11/10/19 1452    Subjective  Had breathing troubles that lead to an ED visit to check out her lungs. Lungs checked ok clear. Pt is concerned she may have to give exercising at the pool secondary to the chemicals. I have been lifting  my weights at home.    Pertinent History  right shoulder impingement, cervicalgia, right Trochanteric bursitis, dyspnea, HTN,    Currently in Pain?  No/denies   Feels tight   Multiple Pain Sites  No         [facility] PT Assessment - 11/10/19 0001      PROM   Left Knee Flexion  119                    [facility] Adult PT Treatment/Exercise - 11/10/19 0001      Knee/Hip Exercises: Seated   Long Arc Quad  Strengthening;Both;3 sets;10 reps;Weights    Long Arc Quad Weight  5 lbs.      Manual Therapy   Joint Mobilization  Lt hip posterior mobs intermittently when stetching the knee in supine    Passive ROM  Prone knee flexion PROM and static stretch , prone knee extension stretching   Seated knee flexion static stretching                 PT Long  Term Goals - 09/10/19 1428      PT LONG TERM GOAL #1   Title  PROM Left knee flexion 120 degrees    Baseline  117 (09/10/19)    Status  On-going            Plan - 11/10/19 1450    Clinical Impression Statement  Passive knee flexion 119 degres post session. Pt requires VC to heel strike better when she walks. She consistently walks with a flat foot.    Personal Factors and Comorbidities  Profession    Comorbidities  right shoulder impingement, cervicalgia, right Trochanteric bursitis, dyspnea, HTN    Examination-Activity Limitations  Bend;Lift;Locomotion Level;Stand;Stairs;Transfers    Examination-Participation Restrictions  Community Activity    Stability/Clinical Decision Making  Stable/Uncomplicated    Rehab Potential  Excellent    PT Frequency  2x / week    PT Duration  12 weeks    PT Treatment/Interventions  ADLs/Self Care Home Management;Electrical Stimulation;Moist Heat;DME Instruction;Gait training;Stair training;Functional mobility training;Therapeutic activities;Therapeutic exercise;Balance training;Neuromuscular re-education;Patient/family education;Manual techniques;Scar mobilization;Compression bandaging;Passive range of motion;Dry needling;Taping;Vasopneumatic Device;Joint Manipulations    PT Next Visit Plan  ERO visit next    PT Home Exercise Plan  [facility]    Consulted and Agree with Plan of Care  Patient       Patient will benefit from skilled therapeutic intervention in order to improve the following deficits and impairments:  Abnormal gait, Decreased activity tolerance, Decreased balance, Decreased endurance, Decreased mobility, Decreased range of motion, Decreased skin integrity, Decreased scar mobility, Decreased strength, Increased edema, Impaired flexibility, Postural dysfunction, Pain  Visit Diagnosis: Unsteadiness on feet  Other abnormalities of gait and mobility  Chronic pain of left knee  Stiffness of left knee, not elsewhere classified  Abnormal  posture  Muscle weakness     Problem List Patient Active Problem List   Diagnosis Date Noted  . Status post total left knee replacement 04/29/2019  . Impingement syndrome of right shoulder 10/28/2018  . Chronic pain of left knee 03/27/2018  . Chronic right shoulder pain 03/27/2018  . Cervicalgia 03/27/2018  . Exposure of implanted vaginal mesh ([place]) 03/06/2018  . Outlet dysfunction constipation 03/06/2018  . Status post arthroscopy of left knee 08/06/2017  . Unilateral primary osteoarthritis, left knee 08/06/2017  . Tremor 07/10/2017  . Other meniscus derangements, posterior horn of medial meniscus, left knee 06/28/2017  . Ureteral stone 05/23/2017  . History of sepsis  05/23/2017  . Acute pain of left knee 04/09/2017  . Trochanteric bursitis, right hip 09/27/2016  . GAD (generalized anxiety disorder) 05/31/2014  . Rectocele 08/21/2011  . Uterovaginal prolapse 08/21/2011  . Dyspnea 10/13/2010  . Hypertension 10/13/2010    Hoang Pettingill,JENNIFERPTA 11/10/2019, 3:25 PM  [facility] [address], [place], [place], [address] Phone: [phone]   Fax:  [phone]  Name: Sharon [name] MRN: [id number] Date of Birth: 1965/12/23

## 2019-11-12 ENCOUNTER — Ambulatory Visit: Payer: PRIVATE HEALTH INSURANCE | Admitting: Physical Therapy

## 2019-11-12 ENCOUNTER — Other Ambulatory Visit: Payer: Self-pay

## 2019-11-12 ENCOUNTER — Encounter: Payer: Self-pay | Admitting: Physical Therapy

## 2019-11-12 DIAGNOSIS — R2689 Other abnormalities of gait and mobility: Secondary | ICD-10-CM

## 2019-11-12 DIAGNOSIS — R2681 Unsteadiness on feet: Secondary | ICD-10-CM

## 2019-11-12 DIAGNOSIS — R293 Abnormal posture: Secondary | ICD-10-CM

## 2019-11-12 DIAGNOSIS — R600 Localized edema: Secondary | ICD-10-CM

## 2019-11-12 DIAGNOSIS — M25562 Pain in left knee: Secondary | ICD-10-CM

## 2019-11-12 DIAGNOSIS — M25662 Stiffness of left knee, not elsewhere classified: Secondary | ICD-10-CM

## 2019-11-12 DIAGNOSIS — M6281 Muscle weakness (generalized): Secondary | ICD-10-CM

## 2019-11-12 NOTE — Therapy (Signed)
[facility] [address] [place], [place], [address] Phone: [phone]   Fax:  [phone]  Physical Therapy Treatment  Patient Details  Name: Sharon [name] MRN: [id number] Date of Birth: 02/23/1966 Referring Provider (PT): Jean Rosenthal, MD   Encounter Date: 11/12/2019  PT End of Session - 11/12/19 1732    Visit Number  [id number]    Date for PT Re-Evaluation  11/17/19    Authorization Type  [employer] - 30 visits beginning 2021 - track visits below    PT Start Time  1446    PT Stop Time  1530    PT Time Calculation (min)  44 min    Activity Tolerance  Patient tolerated treatment well    Behavior During Therapy  [facility] for tasks assessed/performed       Past Medical History:  Diagnosis Date  . Allergy   . Anal fissure   . Anemia    borderline  . Anxiety   . Arthritis   . Blood clot in vein    left leg  . Detached retina    BOTH SCLERA BUCKLE BOTH EYES  . DVT (deep venous thrombosis) (HCC)    left leg  . Endometriosis   . Family history of adverse reaction to anesthesia    DAUGHTER POST OP PONV  . GERD (gastroesophageal reflux disease)   . Glaucoma    RIGHT WORST THAN LEFT  . Heart murmur    "caused by anxiety"   . History of hemorrhoids   . History of kidney stones   . Hypertension   . IBS (irritable bowel syndrome)   . Perforated eardrum, right    SMALL HOLE  . PONV (postoperative nausea and vomiting)   . Pulmonary embolism ([place]) 6 YRS AGO    Past Surgical History:  Procedure Laterality Date  . ABDOMINAL HYSTERECTOMY    . CATARACT EXTRACTION Bilateral 2015  . COLONOSCOPY    . CYSTOSCOPY W/ URETERAL STENT PLACEMENT Right 05/23/2017   Procedure: CYSTOSCOPY WITH RETROGRADE PYELOGRAM/URETERAL RIGHT STENT PLACEMENT;  Surgeon: Bjorn Loser, MD;  Location: [facility];  Service: Urology;  Laterality: Right;  . CYSTOSCOPY W/ URETERAL STENT PLACEMENT Right 06/04/2017   Procedure: CYSTOSCOPY  WITHRIGHT RETROGRADE PYELOGRAMRIGHT /URETEREOSCOPY  STENT PLACEMENT;  Surgeon: Lucas Mallow, MD;  Location: [facility];  Service: Urology;  Laterality: Right;  . ENDOMETRIAL ABLATION    . EYE SURGERY Bilateral    cataract surgery with lens implants  . [place]  2011  . INCONTINENCE SURGERY    . INNER EAR SURGERY     X 2  . KNEE SURGERY    . RECTAL PROLAPSE REPAIR    . RETINAL DETACHMENT SURGERY Bilateral 2000  . RETINAL DETACHMENT SURGERY Bilateral   . TOTAL KNEE ARTHROPLASTY Left 04/29/2019  . TOTAL KNEE ARTHROPLASTY Left 04/29/2019   Procedure: LEFT TOTAL KNEE ARTHROPLASTY;  Surgeon: Mcarthur Rossetti, MD;  Location: [facility];  Service: Orthopedics;  Laterality: Left;  . TUBAL LIGATION    . VEIN SURGERY Left 04/2010    There were no vitals filed for this visit.  Subjective Assessment - 11/12/19 1452    Subjective  Pt states she is feeling better than last time.    Patient Stated Goals  To walk without cane or pain.    Currently in Pain?  Yes    Pain Score  6     Pain Location  Knee    Pain Orientation  Left  Pain Descriptors / Indicators  Tightness    Pain Type  Chronic pain    Pain Relieving Factors  waking up in the morning it is the least painful         [facility] PT Assessment - 11/12/19 0001      PROM   Left Knee Flexion  121                    [facility] Adult PT Treatment/Exercise - 11/12/19 0001      Knee/Hip Exercises: Stretches   Other Knee/Hip Stretches  knee flexion and extension      Knee/Hip Exercises: Aerobic   Recumbent Bike  L1x 56mn knee warm up      Knee/Hip Exercises: Seated   Long Arc Quad  Strengthening;Both;3 sets;10 reps;Weights    Long Arc Quad Weight  5 lbs.      Manual Therapy   Passive ROM  seated and supine knee flexion and ext                  PT Long Term Goals - 11/12/19 1457      PT LONG TERM GOAL #1   Title  PROM Left knee flexion 120 degrees    Baseline  121 (11/12/19)     Status  Achieved      PT LONG TERM GOAL #2   Title  PROM Left knee extension 0    Baseline  4    Status  Not Met      PT LONG TERM GOAL #3   Title  Single leg stance LLE >/= 5 sec    Baseline  13 sec    Status  Achieved      PT LONG TERM GOAL #4   Title  ambulates with heel strike and arm swing at least 2 laps around the gym (appx 150 ft)    Status  Achieved      PT LONG TERM GOAL #5   Title  Patient ambulates >400' without assistive device modified independent.    Baseline  able to walk at least one lap around the track no AD    Status  Achieved      PT LONG TERM GOAL #6   Title  Patient negotiates stairs with single rail, ramps & curbs without assistive device modified independent.    Status  Achieved      PT LONG TERM GOAL #7   Title  Patient reports left knee pain </= 6/10 without prescription pain medications (using only over the counter meds)    Status  Not Met            Plan - 11/12/19 1619    Clinical Impression Statement  Pt has met most goals.  She has 121 PROM today and that has been the highest number for over a month.  She appears to be reaching a plateau with ROM and gait.  Any further progress can be made by patient as she does her HEP.  Pt will benefit from d/c from PT with HEP today.    PT Treatment/Interventions  ADLs/Self Care Home Management;Electrical Stimulation;Moist Heat;DME Instruction;Gait training;Stair training;Functional mobility training;Therapeutic activities;Therapeutic exercise;Balance training;Neuromuscular re-education;Patient/family education;Manual techniques;Scar mobilization;Compression bandaging;Passive range of motion;Dry needling;Taping;Vasopneumatic Device;Joint Manipulations    PT Next Visit Plan  d/c    PT Home Exercise Plan  [facility]   Consulted and Agree with Plan of Care  Patient       Patient will benefit from skilled therapeutic intervention in  order to improve the following deficits and impairments:  Abnormal gait,  Decreased activity tolerance, Decreased balance, Decreased endurance, Decreased mobility, Decreased range of motion, Decreased skin integrity, Decreased scar mobility, Decreased strength, Increased edema, Impaired flexibility, Postural dysfunction, Pain  Visit Diagnosis: Unsteadiness on feet  Other abnormalities of gait and mobility  Chronic pain of left knee  Stiffness of left knee, not elsewhere classified  Muscle weakness  Localized edema  Abnormal posture     Problem List Patient Active Problem List   Diagnosis Date Noted  . Status post total left knee replacement 04/29/2019  . Impingement syndrome of right shoulder 10/28/2018  . Chronic pain of left knee 03/27/2018  . Chronic right shoulder pain 03/27/2018  . Cervicalgia 03/27/2018  . Exposure of implanted vaginal mesh ([place]) 03/06/2018  . Outlet dysfunction constipation 03/06/2018  . Status post arthroscopy of left knee 08/06/2017  . Unilateral primary osteoarthritis, left knee 08/06/2017  . Tremor 07/10/2017  . Other meniscus derangements, posterior horn of medial meniscus, left knee 06/28/2017  . Ureteral stone 05/23/2017  . History of sepsis 05/23/2017  . Acute pain of left knee 04/09/2017  . Trochanteric bursitis, right hip 09/27/2016  . GAD (generalized anxiety disorder) 05/31/2014  . Rectocele 08/21/2011  . Uterovaginal prolapse 08/21/2011  . Dyspnea 10/13/2010  . Hypertension 10/13/2010    Jule Ser, PT 11/12/2019, 5:37 PM  [facility] [address] [place], [place], [address] Phone: [phone]   Fax:  [phone]  Name: Sharon [name] MRN: [id number] Date of Birth: [birth year]  PHYSICAL THERAPY DISCHARGE SUMMARY  Visits from Start of Care: 48  Current functional level related to goals / functional outcomes: See above   Remaining deficits: See above   Education / Equipment: HEP  Plan: Patient agrees to discharge.   Patient goals were partially met. Patient is being discharged due to lack of progress.  ?????     [employer], PT 11/12/19 5:38 PM

## 2019-11-24 ENCOUNTER — Other Ambulatory Visit: Payer: Self-pay | Admitting: Orthopaedic Surgery

## 2019-12-15 ENCOUNTER — Other Ambulatory Visit: Payer: Self-pay

## 2019-12-15 ENCOUNTER — Encounter: Payer: Self-pay | Admitting: Orthopaedic Surgery

## 2019-12-15 ENCOUNTER — Ambulatory Visit (INDEPENDENT_AMBULATORY_CARE_PROVIDER_SITE_OTHER): Payer: PRIVATE HEALTH INSURANCE | Admitting: Orthopaedic Surgery

## 2019-12-15 DIAGNOSIS — Z96652 Presence of left artificial knee joint: Secondary | ICD-10-CM

## 2019-12-15 DIAGNOSIS — G8929 Other chronic pain: Secondary | ICD-10-CM | POA: Diagnosis not present

## 2019-12-15 DIAGNOSIS — M25511 Pain in right shoulder: Secondary | ICD-10-CM

## 2019-12-15 MED ORDER — METHYLPREDNISOLONE ACETATE 40 MG/ML IJ SUSP
40.0000 mg | INTRAMUSCULAR | Status: AC | PRN
  Administered 2019-12-15: 40 mg via INTRA_ARTICULAR

## 2019-12-15 MED ORDER — LIDOCAINE HCL 1 % IJ SOLN
3.0000 mL | INTRAMUSCULAR | Status: AC | PRN
  Administered 2019-12-15: 3 mL

## 2019-12-15 NOTE — Progress Notes (Signed)
Office Visit Note   Patient: Sharon [name]           Date of Birth: [birth year]           MRN: [id number] Visit Date: 12/15/2019              Requested by: Forrest Moron, MD [phone] [place],   [id number] PCP: Forrest Moron, MD   Assessment & Plan: Visit Diagnoses:  1. Status post total left knee replacement   2. Chronic right shoulder pain     Plan: Per her request I did provide a steroid injection in her right shoulder subacromial outlet.  I have encouraged her to continue therapy on her left knee since its been well under a year since her surgery.  I would like to see her back in 3 months with a repeat AP and lateral of the left knee.  Model also what else I can recommend.  She is likely a good candidate for pain management we will make a referral to pain management.  I do agree with her having the need to be on disability at this point given the fact that she just cannot concentrate to participate in any type of meaningful labor at all due to the severity of her pain and her difficulties with mobility as it relates to recovering from her knee replacement.  Follow-Up Instructions: Return in about 3 months (around 03/16/2020).   Orders:  No orders of the defined types were placed in this encounter.  No orders of the defined types were placed in this encounter.     Procedures: Large Joint Inj: R subacromial bursa on 12/15/2019 5:44 PM Indications: pain and diagnostic evaluation Details: 22 G 1.5 in needle  Arthrogram: No  Medications: 3 mL lidocaine 1 %; 40 mg methylPREDNISolone acetate 40 MG/ML Outcome: tolerated well, no immediate complications Procedure, treatment alternatives, risks and benefits explained, specific risks discussed. Consent was given by the patient. Immediately prior to procedure a time out was called to verify the correct patient, procedure, equipment, support staff and site/side marked as required. Patient was  prepped and draped in the usual sterile fashion.       Clinical Data: No additional findings.   Subjective: Chief Complaint  Patient presents with  . Right Shoulder - Follow-up  . Left Knee - Follow-up  The patient is now between 7 and 8 months status post a left total knee arthroplasty.  She also has been dealing with chronic right shoulder pain and impingement.  She is very frustrated with her inability to improve with her left knee and the pain she is having in that left knee.  She does state that she wishes she never had the surgery done.  She did have known severe arthritis in that knee and had also failed all conservative treatment measures including even arthroscopic intervention of that left knee.  We tried to repair her as much mucoid about the postoperative pain the people can experience after knee replacement surgery especially at such a young age of only being 54 years old.  Injections in her right shoulder have helped in terms of steroid injection and she is requesting that today.  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  She still working to try to get through physical therapy to get  her knee bending and moving.  She states that therapy has been able to bend it to 120 degrees in terms of her flexion.  She cannot sleep well at night and cannot concentrate well due to the severity of her pain.  She says she cannot even navigate going up and down stairs well.  2 views of her left knee just a few months ago showed a well-seated implant of a left total knee with no complicating features.  HPI  Review of Systems She currently denies any headache, chest pain, shortness of breath, fever, chills, nausea, vomiting  Objective: Vital Signs: LMP 10/11/2010   Physical Exam She is alert and orient x3 and in no acute distress but obvious discomfort Ortho  Exam Examination of her left knee shows a well-healed surgical incision.  There is no redness about the knee incision.  She does have swelling to be expected in someone who is so thin but also someone who is under a year out from knee replacement surgery.  Feels ligamentously stable and range of motion is certainly limited due to her pain response.  Examination of her right shoulder still shows signs of impingement. Specialty Comments:  No specialty comments available.  Imaging: No results found.   PMFS History: Patient Active Problem List   Diagnosis Date Noted  . Status post total left knee replacement 04/29/2019  . Impingement syndrome of right shoulder 10/28/2018  . Chronic pain of left knee 03/27/2018  . Chronic right shoulder pain 03/27/2018  . Cervicalgia 03/27/2018  . Exposure of implanted vaginal mesh ([place]) 03/06/2018  . Outlet dysfunction constipation 03/06/2018  . Status post arthroscopy of left knee 08/06/2017  . Unilateral primary osteoarthritis, left knee 08/06/2017  . Tremor 07/10/2017  . Other meniscus derangements, posterior horn of medial meniscus, left knee 06/28/2017  . Ureteral stone 05/23/2017  . History of sepsis 05/23/2017  . Acute pain of left knee 04/09/2017  . Trochanteric bursitis, right hip 09/27/2016  . GAD (generalized anxiety disorder) 05/31/2014  . Rectocele 08/21/2011  . Uterovaginal prolapse 08/21/2011  . Dyspnea 10/13/2010  . Hypertension 10/13/2010   Past Medical History:  Diagnosis Date  . Allergy   . Anal fissure   . Anemia    borderline  . Anxiety   . Arthritis   . Blood clot in vein    left leg  . Detached retina    BOTH SCLERA BUCKLE BOTH EYES  . DVT (deep venous thrombosis) (HCC)    left leg  . Endometriosis   . Family history of adverse reaction to anesthesia    DAUGHTER POST OP PONV  . GERD (gastroesophageal reflux disease)   . Glaucoma    RIGHT WORST THAN LEFT  . Heart murmur    "caused by anxiety"   . History of  hemorrhoids   . History of kidney stones   . Hypertension   . IBS (irritable bowel syndrome)   . Perforated eardrum, right    SMALL HOLE  . PONV (postoperative nausea and vomiting)   . Pulmonary embolism ([place]) 6 YRS AGO    Family History  Problem Relation Age of Onset  . Emphysema Mother        smoker  . Allergies Mother   . Asthma Mother   . Heart disease Mother        AMI as cause of death  . COPD Mother   . Asthma Father   . Heart disease Father        AMI as  cause of death  . Diabetes Father   . Asthma Brother   . Heart disease Brother 33       heart failure  . Lung cancer Maternal Grandfather        was a smoker  . Cancer Maternal Grandfather        lung  . Heart disease Paternal Grandmother   . Heart disease Paternal Grandfather   . Colon cancer Neg Hx     Past Surgical History:  Procedure Laterality Date  . ABDOMINAL HYSTERECTOMY    . CATARACT EXTRACTION Bilateral 2015  . COLONOSCOPY    . CYSTOSCOPY W/ URETERAL STENT PLACEMENT Right 05/23/2017   Procedure: CYSTOSCOPY WITH RETROGRADE PYELOGRAM/URETERAL RIGHT STENT PLACEMENT;  Surgeon: Bjorn Loser, MD;  Location: [facility];  Service: Urology;  Laterality: Right;  . CYSTOSCOPY W/ URETERAL STENT PLACEMENT Right 06/04/2017   Procedure: CYSTOSCOPY WITHRIGHT RETROGRADE PYELOGRAMRIGHT /URETEREOSCOPY  STENT PLACEMENT;  Surgeon: Lucas Mallow, MD;  Location[facility];  Service: Urology;  Laterality: Right;  . ENDOMETRIAL ABLATION    . EYE SURGERY Bilateral    cataract surgery with lens implants  . [place]  2011  . INCONTINENCE SURGERY    . INNER EAR SURGERY     X 2  . KNEE SURGERY    . RECTAL PROLAPSE REPAIR    . RETINAL DETACHMENT SURGERY Bilateral 2000  . RETINAL DETACHMENT SURGERY Bilateral   . TOTAL KNEE ARTHROPLASTY Left 04/29/2019  . TOTAL KNEE ARTHROPLASTY Left 04/29/2019   Procedure: LEFT TOTAL KNEE ARTHROPLASTY;  Surgeon: Mcarthur Rossetti, MD;  Location: [facility];   Service: Orthopedics;  Laterality: Left;  . TUBAL LIGATION    . VEIN SURGERY Left 04/2010   Social History   Occupational History  . Occupation: Restaurant manager, fast food: [employer]  Tobacco Use  . Smoking status: Never Smoker  . Smokeless tobacco: Never Used  Vaping Use  . Vaping Use: Never used  Substance and Sexual Activity  . Alcohol use: Not Currently    Comment: occ  . Drug use: No    Comment: former maryjuana use- quit in 1995  . Sexual activity: Yes

## 2019-12-15 NOTE — Progress Notes (Deleted)
Office Visit Note   Patient: Sharon [name]           Date of Birth: 1965/09/18           MRN: [id number] Visit Date: 12/15/2019              Requested by: Forrest Moron, MD [phone] [place],  [place] [id number] PCP: Forrest Moron, MD   Assessment & Plan: Visit Diagnoses:  1. Status post total left knee replacement   2. Chronic right shoulder pain     Plan: ***  Follow-Up Instructions: No follow-ups on file.   Orders:  No orders of the defined types were placed in this encounter.  No orders of the defined types were placed in this encounter.     Procedures: Large Joint Inj: R subacromial bursa on 12/15/2019 4:05 PM Indications: pain and diagnostic evaluation Details: 22 G 1.5 in needle  Arthrogram: No  Medications: 3 mL lidocaine 1 %; 40 mg methylPREDNISolone acetate 40 MG/ML Outcome: tolerated well, no immediate complications Procedure, treatment alternatives, risks and benefits explained, specific risks discussed. Consent was given by the patient. Immediately prior to procedure a time out was called to verify the correct patient, procedure, equipment, support staff and site/side marked as required. Patient was prepped and draped in the usual sterile fashion.       Clinical Data: No additional findings.   Subjective: Chief Complaint  Patient presents with  . Right Shoulder - Follow-up  . Left Knee - Follow-up    HPI  Review of Systems   Objective: Vital Signs: LMP 10/11/2010   Physical Exam  Ortho Exam  Specialty Comments:  No specialty comments available.  Imaging: No results found.   PMFS History: Patient Active Problem List   Diagnosis Date Noted  . Status post total left knee replacement 04/29/2019  . Impingement syndrome of right shoulder 10/28/2018  . Chronic pain of left knee 03/27/2018  . Chronic right shoulder pain 03/27/2018  . Cervicalgia 03/27/2018  . Exposure of implanted vaginal mesh  ([place]) 03/06/2018  . Outlet dysfunction constipation 03/06/2018  . Status post arthroscopy of left knee 08/06/2017  . Unilateral primary osteoarthritis, left knee 08/06/2017  . Tremor 07/10/2017  . Other meniscus derangements, posterior horn of medial meniscus, left knee 06/28/2017  . Ureteral stone 05/23/2017  . History of sepsis 05/23/2017  . Acute pain of left knee 04/09/2017  . Trochanteric bursitis, right hip 09/27/2016  . GAD (generalized anxiety disorder) 05/31/2014  . Rectocele 08/21/2011  . Uterovaginal prolapse 08/21/2011  . Dyspnea 10/13/2010  . Hypertension 10/13/2010   Past Medical History:  Diagnosis Date  . Allergy   . Anal fissure   . Anemia    borderline  . Anxiety   . Arthritis   . Blood clot in vein    left leg  . Detached retina    BOTH SCLERA BUCKLE BOTH EYES  . DVT (deep venous thrombosis) (HCC)    left leg  . Endometriosis   . Family history of adverse reaction to anesthesia    DAUGHTER POST OP PONV  . GERD (gastroesophageal reflux disease)   . Glaucoma    RIGHT WORST THAN LEFT  . Heart murmur    "caused by anxiety"   . History of hemorrhoids   . History of kidney stones   . Hypertension   . IBS (irritable bowel syndrome)   . Perforated eardrum, right    SMALL HOLE  .  PONV (postoperative nausea and vomiting)   . Pulmonary embolism ([place]) 6 YRS AGO    Family History  Problem Relation Age of Onset  . Emphysema Mother        smoker  . Allergies Mother   . Asthma Mother   . Heart disease Mother        AMI as cause of death  . COPD Mother   . Asthma Father   . Heart disease Father        AMI as cause of death  . Diabetes Father   . Asthma Brother   . Heart disease Brother 2       heart failure  . Lung cancer Maternal Grandfather        was a smoker  . Cancer Maternal Grandfather        lung  . Heart disease Paternal Grandmother   . Heart disease Paternal Grandfather   . Colon cancer Neg Hx     Past Surgical History:  Procedure  Laterality Date  . ABDOMINAL HYSTERECTOMY    . CATARACT EXTRACTION Bilateral 2015  . COLONOSCOPY    . CYSTOSCOPY W/ URETERAL STENT PLACEMENT Right 05/23/2017   Procedure: CYSTOSCOPY WITH RETROGRADE PYELOGRAM/URETERAL RIGHT STENT PLACEMENT;  Surgeon: Bjorn Loser, MD;  Location: [facility];  Service: Urology;  Laterality: Right;  . CYSTOSCOPY W/ URETERAL STENT PLACEMENT Right 06/04/2017   Procedure: CYSTOSCOPY WITHRIGHT RETROGRADE PYELOGRAMRIGHT /URETEREOSCOPY  STENT PLACEMENT;  Surgeon: Lucas Mallow, MD;  Location: [facility];  Service: Urology;  Laterality: Right;  . ENDOMETRIAL ABLATION    . EYE SURGERY Bilateral    cataract surgery with lens implants  . [place]  2011  . INCONTINENCE SURGERY    . INNER EAR SURGERY     X 2  . KNEE SURGERY    . RECTAL PROLAPSE REPAIR    . RETINAL DETACHMENT SURGERY Bilateral 2000  . RETINAL DETACHMENT SURGERY Bilateral   . TOTAL KNEE ARTHROPLASTY Left 04/29/2019  . TOTAL KNEE ARTHROPLASTY Left 04/29/2019   Procedure: LEFT TOTAL KNEE ARTHROPLASTY;  Surgeon: Mcarthur Rossetti, MD;  Location: [facility];  Service: Orthopedics;  Laterality: Left;  . TUBAL LIGATION    . VEIN SURGERY Left 04/2010   Social History   Occupational History  . Occupation: Restaurant manager, fast food: [employer]  Tobacco Use  . Smoking status: Never Smoker  . Smokeless tobacco: Never Used  Vaping Use  . Vaping Use: Never used  Substance and Sexual Activity  . Alcohol use: Not Currently    Comment: occ  . Drug use: No    Comment: former maryjuana use- quit in 1995  . Sexual activity: Yes

## 2019-12-16 ENCOUNTER — Other Ambulatory Visit: Payer: Self-pay

## 2019-12-16 DIAGNOSIS — M7541 Impingement syndrome of right shoulder: Secondary | ICD-10-CM

## 2019-12-16 DIAGNOSIS — M25511 Pain in right shoulder: Secondary | ICD-10-CM

## 2019-12-16 DIAGNOSIS — M5441 Lumbago with sciatica, right side: Secondary | ICD-10-CM

## 2019-12-16 DIAGNOSIS — M25572 Pain in left ankle and joints of left foot: Secondary | ICD-10-CM

## 2019-12-16 DIAGNOSIS — Z96652 Presence of left artificial knee joint: Secondary | ICD-10-CM

## 2019-12-16 DIAGNOSIS — G8929 Other chronic pain: Secondary | ICD-10-CM

## 2019-12-24 ENCOUNTER — Encounter: Payer: Self-pay | Admitting: Physical Medicine and Rehabilitation

## 2020-01-12 NOTE — Progress Notes (Deleted)
NEUROLOGY FOLLOW UP OFFICE NOTE  Sharon [name] [id number]  HISTORY OF PRESENT ILLNESS: Sharon [name] is a 54 year old female with generalized anxiety disorder, restless leg syndrome, hypertension, glaucoma, arthritis, chronic pain and history of PE and DVT in left leg who follows up for essential tremor.  UPDATE: Medications:  Primidone 100mg  in AM and 50mg  in PM; ibuprofen; Robaxin 500mg ; Klonopin; Celebrex; oxycodone; Relafen  ***    HISTORY: She started having tremors around 2015. It involves both hands. It interferes with daily tasks. She drops a lot. She has occasional trouble with utensils or writing. Nothing particular makes it worse. Steadying her wrist on the arm rest will suppress it.   Her mother and grandmother had tremor.  She has chronic generalized pain as well as neck pain. She reports a creepy crawly feeling under her skin, including back and down the legs. She has been diagnosed with restless leg syndrome. MRI of cervical spine from 04/12/18 was personally reviewed and showed cervical spondylosis, degenerative disc disease and congenitally short pedicles, causing mild impingement at C3-4 on left, C6-7 on right and C7-T1 on left. CT of right shoulder on 04/11/18 showed severe supraspinatus and infraspinatus tendinopathy without tear as well as small type 2 SLAP tear. She notes a 'pinching" pain radiating down the lateral arm and into the fingers with associated numbness and tingling in the fingers.MRI lumbar spine from 04/13/18 showed L4-L5 right foraminal disc protrusion that may be causing right L4 nerve root irritation. L5-S1 moderate face arthrosis and small central protrusion with annular fissure.  She underwent lab work on 03/04/2019 to evaluate chronic pain.  ANA was negative, RF negative, sed rate 5, CRP <0.1, CK 49  For anxiety and RLS, she takes clonazepam 0.5mg  at bedtime.She takes Bystolic for blood pressure.  PAST MEDICAL  HISTORY: Past Medical History:  Diagnosis Date  . Allergy   . Anal fissure   . Anemia    borderline  . Anxiety   . Arthritis   . Blood clot in vein    left leg  . Detached retina    BOTH SCLERA BUCKLE BOTH EYES  . DVT (deep venous thrombosis) (HCC)    left leg  . Endometriosis   . Family history of adverse reaction to anesthesia    DAUGHTER POST OP PONV  . GERD (gastroesophageal reflux disease)   . Glaucoma    RIGHT WORST THAN LEFT  . Heart murmur    "caused by anxiety"   . History of hemorrhoids   . History of kidney stones   . Hypertension   . IBS (irritable bowel syndrome)   . Perforated eardrum, right    SMALL HOLE  . PONV (postoperative nausea and vomiting)   . Pulmonary embolism (HCC) 6 YRS AGO    MEDICATIONS: Current Outpatient Medications on File Prior to Visit  Medication Sig Dispense Refill  . acetaminophen (TYLENOL) 500 MG tablet Take 1,000 mg by mouth every 8 (eight) hours as needed for moderate pain.     Marland Kitchen acyclovir (ZOVIRAX) 400 MG tablet Take 1 tablet (400 mg total) by mouth at bedtime. 90 tablet 3  . Ascorbic Acid (VITAMIN C) 1000 MG tablet Take 1,000 mg by mouth daily.    . ASPIRIN LOW DOSE 81 MG chewable tablet CHEW 1 TABLET (81 MG TOTAL) BY MOUTH 2 (TWO) TIMES DAILY. 30 tablet 0  . BYSTOLIC 5 MG tablet TAKE 1 TABLET BY MOUTH EVERY DAY 30 tablet 5  . celecoxib (CELEBREX) 200 MG  capsule TAKE 1 CAPSULE BY MOUTH TWICE A DAY 60 capsule 1  . Cholecalciferol (VITAMIN D) 2000 UNITS tablet Take 2,000 Units by mouth daily.    . clonazePAM (KLONOPIN) 0.5 MG tablet Take 1 tablet (0.5 mg total) by mouth at bedtime. 30 tablet 3  . EPINEPHrine 0.3 mg/0.3 mL IJ SOAJ injection Inject 0.3 mLs (0.3 mg total) into the muscle once as needed (For anaphylaxis.). (Patient taking differently: Inject 0.3 mg into the muscle once as needed for anaphylaxis. ) 1 each 1  . HYDROcodone-acetaminophen (NORCO/VICODIN) 5-325 MG tablet Take 1-2 tablets by mouth 3 (three) times daily as  needed for moderate pain. 40 tablet 0  . ibuprofen (ADVIL,MOTRIN) 200 MG tablet Take 400 mg by mouth 2 (two) times daily.     . Magnesium 500 MG CAPS Take 500 mg by mouth daily.     . methocarbamol (ROBAXIN) 500 MG tablet Take 1 tablet (500 mg total) by mouth every 6 (six) hours as needed for muscle spasms. 60 tablet 1  . Multiple Vitamin (MULTI-VITAMINS) TABS Take 1 tablet by mouth daily.     . nabumetone (RELAFEN) 750 MG tablet TAKE 1 TABLET (750 MG TOTAL) BY MOUTH 2 (TWO) TIMES DAILY AS NEEDED. 60 tablet 2  . Nitroglycerin (RECTIV) 0.4 % OINT Place 0.4 inches rectally 2 (two) times daily as needed (For anal fissures.). 30 g 1  . omeprazole (PRILOSEC) 20 MG capsule Take 1 capsule (20 mg total) by mouth 2 (two) times daily before a meal. 180 capsule 3  . ondansetron (ZOFRAN ODT) 4 MG disintegrating tablet Take 1 tablet (4 mg total) by mouth every 8 (eight) hours as needed for nausea or vomiting. 20 tablet 1  . ondansetron (ZOFRAN) 8 MG tablet Take 1 tablet (8 mg total) by mouth every 8 (eight) hours as needed for nausea or vomiting. 20 tablet 1  . Polyethylene Glycol 3350 (MIRALAX PO) Take 17 g by mouth daily as needed (constipation).     . primidone (MYSOLINE) 50 MG tablet TAKE 3 TABLETS BY MOUTH EVERY DAY 90 tablet 3  . senna-docusate (SENOKOT-S) 8.6-50 MG tablet Take 1 tablet by mouth daily as needed for moderate constipation.     Marland Kitchen tiZANidine (ZANAFLEX) 4 MG tablet Take 1 tablet (4 mg total) by mouth every 8 (eight) hours as needed for muscle spasms. 60 tablet 1  . Travoprost, BAK Free, (TRAVATAN) 0.004 % SOLN ophthalmic solution INSTILL 1 DROP INTO BOTH EYES AT BEDTIME 5 mL 3  . vitamin B-12 (CYANOCOBALAMIN) 1000 MCG tablet Take 1 tablet (1,000 mcg total) by mouth daily. 30 tablet 3  . zinc gluconate 50 MG tablet Take 50 mg by mouth daily.     No current facility-administered medications on file prior to visit.    ALLERGIES: Allergies  Allergen Reactions  . Penicillins Other (See  Comments)    Reaction unknown from childhood Has patient had a PCN reaction causing immediate rash, facial/tongue/throat swelling, SOB or lightheadedness with hypotension: unknown Has patient had a PCN reaction causing severe rash involving mucus membranes or skin necrosis: unknown  Has patient had a PCN reaction that required hospitalization: no Has patient had a PCN reaction occurring within the last 10 years: no If all of the above answers are "NO", then may proceed with Cephalosporin use.   Marland Kitchen Dextromethorphan Polistirex Er Other (See Comments)    Pt states caused "a lump" in her throat, making it difficult to swallow  . Diflucan [Fluconazole]     Primidone interaction  .  Hydrocodone Nausea And Vomiting  . Oxycodone-Acetaminophen Other (See Comments)    Made fingers tingle and go numb, started "feeling weird".   . Tape Itching and Other (See Comments)    Bandaids - leaves red marks    FAMILY HISTORY: Family History  Problem Relation Age of Onset  . Emphysema Mother        smoker  . Allergies Mother   . Asthma Mother   . Heart disease Mother        AMI as cause of death  . COPD Mother   . Asthma Father   . Heart disease Father        AMI as cause of death  . Diabetes Father   . Asthma Brother   . Heart disease Brother 77       heart failure  . Lung cancer Maternal Grandfather        was a smoker  . Cancer Maternal Grandfather        lung  . Heart disease Paternal Grandmother   . Heart disease Paternal Grandfather   . Colon cancer Neg Hx     SOCIAL HISTORY: Social History   Socioeconomic History  . Marital status: Divorced    Spouse name: Not on file  . Number of children: 1  . Years of education: Not on file  . Highest education level: Not on file  Occupational History  . Occupation: Restaurant manager, fast food: [employer]  Tobacco Use  . Smoking status: Never Smoker  . Smokeless tobacco: Never Used  Vaping Use  . Vaping Use: Never  used  Substance and Sexual Activity  . Alcohol use: Not Currently    Comment: occ  . Drug use: No    Comment: former maryjuana use- quit in 1995  . Sexual activity: Yes  Other Topics Concern  . Not on file  Social History Narrative   Marital status: divorced x 2; dating seriously x 3 years.  Boyfriend with HIV.       Children:  1 daughter (21); no grandchildren      Lives:  With boyfriend.        Employment: works at [employer]: none      Alcohol: rarely; socially      Exercise: none; has treadmill and elliptical and bikes   Coffee in am / 1/2 of coke in afternoon    [employer] school education   Social Determinants of Health   Financial Resource Strain:   . Difficulty of Paying Living Expenses:   Food Insecurity:   . Worried About Charity fundraiser in the Last Year:   . Arboriculturist in the Last Year:   Transportation Needs:   . Film/video editor (Medical):   Marland Kitchen Lack of Transportation (Non-Medical):   Physical Activity:   . Days of Exercise per Week:   . Minutes of Exercise per Session:   Stress:   . Feeling of Stress :   Social Connections:   . Frequency of Communication with Friends and Family:   . Frequency of Social Gatherings with Friends and Family:   . Attends Religious Services:   . Active Member of Clubs or Organizations:   . Attends Archivist Meetings:   Marland Kitchen Marital Status:   Intimate Partner Violence:   . Fear of Current or Ex-Partner:   . Emotionally Abused:   Marland Kitchen Physically Abused:   . Sexually Abused:  PHYSICAL EXAM: *** General: No acute distress.  Patient appears well-groomed.   Head:  Normocephalic/atraumatic Eyes:  Fundi examined but not visualized Neck: supple, no paraspinal tenderness, full range of motion Heart:  Regular rate and rhythm Lungs:  Clear to auscultation bilaterally Back: No paraspinal tenderness Neurological Exam: alert and oriented to person, place, and time. Attention span and concentration intact, recent  and remote memory intact, fund of knowledge intact.  Speech fluent and not dysarthric, language intact.  CN II-XII intact. Bulk and tone normal, muscle strength 5/5 throughout.  Sensation to light touch, temperature and vibration intact.  Deep tendon reflexes 2+ throughout, toes downgoing.  Finger to nose and heel to shin testing intact.  Gait normal, Romberg negative.  IMPRESSION: Essential tremor  PLAN: ***  Metta Clines, DO  CC: Delia Chimes, MD

## 2020-01-13 ENCOUNTER — Ambulatory Visit: Payer: No Typology Code available for payment source | Admitting: Neurology

## 2020-01-16 ENCOUNTER — Telehealth: Payer: Self-pay | Admitting: Orthopaedic Surgery

## 2020-01-16 NOTE — Telephone Encounter (Signed)
Received call from Santiago Glad with [facility] stating patient need a letter of medical necessity sent to [place] to get an approval for (PT) The number to contact patient is [phone]

## 2020-01-20 ENCOUNTER — Other Ambulatory Visit: Payer: Self-pay | Admitting: Orthopaedic Surgery

## 2020-01-21 ENCOUNTER — Encounter: Payer: Self-pay | Admitting: Neurology

## 2020-01-21 ENCOUNTER — Other Ambulatory Visit: Payer: Self-pay | Admitting: Neurology

## 2020-01-23 ENCOUNTER — Other Ambulatory Visit: Payer: Self-pay

## 2020-01-23 ENCOUNTER — Encounter: Payer: Self-pay | Admitting: Physical Medicine and Rehabilitation

## 2020-01-23 ENCOUNTER — Encounter
Payer: PRIVATE HEALTH INSURANCE | Attending: Physical Medicine and Rehabilitation | Admitting: Physical Medicine and Rehabilitation

## 2020-01-23 VITALS — BP 122/85 | HR 79 | Temp 98.7°F | Ht 66.0 in | Wt 142.8 lb

## 2020-01-23 DIAGNOSIS — M25561 Pain in right knee: Secondary | ICD-10-CM

## 2020-01-23 DIAGNOSIS — G894 Chronic pain syndrome: Secondary | ICD-10-CM

## 2020-01-23 DIAGNOSIS — M21612 Bunion of left foot: Secondary | ICD-10-CM | POA: Diagnosis present

## 2020-01-23 DIAGNOSIS — Z79891 Long term (current) use of opiate analgesic: Secondary | ICD-10-CM | POA: Diagnosis not present

## 2020-01-23 DIAGNOSIS — M25562 Pain in left knee: Secondary | ICD-10-CM | POA: Insufficient documentation

## 2020-01-23 DIAGNOSIS — Z5181 Encounter for therapeutic drug level monitoring: Secondary | ICD-10-CM | POA: Diagnosis not present

## 2020-01-23 NOTE — Progress Notes (Signed)
Subjective:    Patient ID: Sharon [name], female    DOB: 07/01/1965, 53 y.o.   MRN: [id number]  HPI Mrs. Knee is a 53 year old woman who presents to establish care for left knee pain following left knee replacement in 04/2019.   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   Can ride the stationary bicycle and that does not hurt. She sues it every day. She uses leg weights. When   Scar tissue grew back quickly post-surgery. She could not get PT appointments for 3 weeks.   Her surgeon does not want her to break the hardware and he may have to do arthroscopic procedure to flush out the inflammation.   She has tried ice which stopped helping, nabumetone 750 mg BID, Norco 1-2 tablets three times per day PRN (she has 12 left). She uses diclofenac gel   She takes Miralax for constipation. She takes up to 2 times per day. She has a history of IBS.   [facility] in [place]- she was a Research scientist (physical sciences). They let her go because she did not return in 12 weeks.   She was doing pool therapy but chemicals in the pool were increased because of COVID.  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 Notes reviewed- recently had a right subacromial injection.   Current pain is 8/10.    Pain Inventory Average Pain 8 Pain Right Now 6 My pain is constant, sharp, stabbing and aching  In the last 24 hours, has pain interfered with the following? General activity 10 Relation with others 9 Enjoyment of life 10 What TIME of day is your pain at its worst? daytime Sleep (in general) Fair  Pain is worse with: walking, bending, sitting and standing Pain improves with: rest, therapy/exercise, medication and injections Relief from Meds: 8  Mobility use a cane how many minutes can you walk?  15 mins ability to climb steps?  no do you drive?  yes Do you have any goals in this area?   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  yes  Neuro/Psych tremor trouble walking spasms anxiety  Prior Studies Any changes since last visit?  no  Physicians involved in your care New Patient   Family History  Problem Relation Age of Onset   Emphysema Mother        smoker   Allergies Mother    Asthma Mother    Heart disease Mother        AMI as cause of death   COPD Mother    Asthma Father    Heart disease Father        AMI as cause of death   Diabetes Father    Asthma Brother    Heart disease Brother 15       heart failure   Lung cancer Maternal Grandfather        was a smoker   Cancer Maternal Grandfather        lung   Heart disease Paternal Grandmother    Heart disease Paternal Grandfather    Colon cancer Neg Hx    Social History   Socioeconomic History   Marital status: Divorced    Spouse  name: Not on file   Number of children: 1   Years of education: Not on file   Highest education level: Not on file  Occupational History   Occupation: Restaurant manager, fast food: [employer]  Tobacco Use   Smoking status: Never Smoker   Smokeless tobacco: Never Used  Vaping Use   Vaping Use: Never used  Substance and Sexual Activity   Alcohol use: Not Currently    Comment: occ   Drug use: No    Comment: former maryjuana use- quit in 1995   Sexual activity: Yes  Other Topics Concern   Not on file  Social History Narrative   Marital status: divorced x 2; dating seriously x 3 years.  Boyfriend with HIV.       Children:  1 daughter (42); no grandchildren      Lives:  With boyfriend.        Employment: works at [employer]: none      Alcohol: rarely; socially      Exercise: none; has treadmill and elliptical and bikes   Coffee in am / 1/2  of coke in afternoon    High school education   Social Determinants of Health   Financial Resource Strain:    Difficulty of Paying Living Expenses:   Food Insecurity:    Worried About Charity fundraiser in the Last Year:    Arboriculturist in the Last Year:   Transportation Needs:    Film/video editor (Medical):    Lack of Transportation (Non-Medical):   Physical Activity:    Days of Exercise per Week:    Minutes of Exercise per Session:   Stress:    Feeling of Stress :   Social Connections:    Frequency of Communication with Friends and Family:    Frequency of Social Gatherings with Friends and Family:    Attends Religious Services:    Active Member of Clubs or Organizations:    Attends Music therapist:    Marital Status:    Past Surgical History:  Procedure Laterality Date   ABDOMINAL HYSTERECTOMY     CATARACT EXTRACTION Bilateral 2015   COLONOSCOPY     CYSTOSCOPY W/ URETERAL STENT PLACEMENT Right 05/23/2017   Procedure: CYSTOSCOPY WITH RETROGRADE PYELOGRAM/URETERAL RIGHT STENT PLACEMENT;  Surgeon: Bjorn Loser, MD;  Location: [facility];  Service: Urology;  Laterality: Right;   CYSTOSCOPY W/ URETERAL STENT PLACEMENT Right 06/04/2017   Procedure: CYSTOSCOPY WITHRIGHT RETROGRADE PYELOGRAMRIGHT /URETEREOSCOPY  STENT PLACEMENT;  Surgeon: Lucas Mallow, MD;  Location: [facility];  Service: Urology;  Laterality: Right;   ENDOMETRIAL ABLATION     EYE SURGERY Bilateral    cataract surgery with lens implants   HEMORRHOID SURGERY  2011   INCONTINENCE SURGERY     INNER EAR SURGERY     X 2   KNEE SURGERY     RECTAL PROLAPSE REPAIR     RETINAL DETACHMENT SURGERY Bilateral 2000   RETINAL DETACHMENT SURGERY Bilateral    TOTAL KNEE ARTHROPLASTY Left 04/29/2019   TOTAL KNEE ARTHROPLASTY Left 04/29/2019   Procedure: LEFT TOTAL KNEE ARTHROPLASTY;  Surgeon: Mcarthur Rossetti, MD;  Location: [facility];  Service:  Orthopedics;  Laterality: Left;   TUBAL LIGATION     VEIN SURGERY Left 04/2010   Past Medical History:  Diagnosis Date   Allergy    Anal fissure    Anemia  borderline   Anxiety    Arthritis    Blood clot in vein    left leg   Detached retina    BOTH SCLERA BUCKLE BOTH EYES   DVT (deep venous thrombosis) (HCC)    left leg   Endometriosis    Family history of adverse reaction to anesthesia    DAUGHTER POST OP PONV   GERD (gastroesophageal reflux disease)    Glaucoma    RIGHT WORST THAN LEFT   Heart murmur    "caused by anxiety"    History of hemorrhoids    History of kidney stones    Hypertension    IBS (irritable bowel syndrome)    Perforated eardrum, right    SMALL HOLE   PONV (postoperative nausea and vomiting)    Pulmonary embolism (HCC) 6 YRS AGO   BP 122/85    Pulse 79    Temp 98.7 F (37.1 C)    Ht 5\' 6"  (1.676 m)    Wt 142 lb 12.8 oz (64.8 kg)    LMP 10/11/2010    SpO2 98%    BMI 23.05 kg/m   Opioid Risk Score:   Fall Risk Score:  `1  Depression screen PHQ 2/9  Depression screen [facility] 2/9 01/23/2020 06/09/2019 01/20/2019 07/22/2018 07/19/2018 01/01/2018 06/30/2017  Decreased Interest 3 0 0 0 0 0 0  Down, Depressed, Hopeless 3 0 0 0 0 0 0  PHQ - 2 Score 6 0 0 0 0 0 0  Altered sleeping 3 - - - - - -  Tired, decreased energy 3 - - - - - -  Change in appetite 3 - - - - - -  Feeling bad or failure about yourself  0 - - - - - -  Trouble concentrating 1 - - - - - -  Moving slowly or fidgety/restless 0 - - - - - -  Suicidal thoughts 0 - - - - - -  PHQ-9 Score 16 - - - - - -  Some recent data might be hidden   Review of Systems  Constitutional: Negative.        Weight loss  HENT: Negative.   Eyes: Negative.   Respiratory: Positive for shortness of breath.   Cardiovascular: Negative.   Gastrointestinal: Positive for abdominal pain, constipation and nausea.  Endocrine: Negative.   Genitourinary: Negative.   Musculoskeletal: Positive for back  pain and gait problem.       Spasms  Skin: Negative.   Allergic/Immunologic: Negative.   Neurological: Positive for tremors.  Hematological: Negative.   Psychiatric/Behavioral:       Anxiety  All other systems reviewed and are negative.      Objective:   Physical Exam Gen: no distress, normal appearing HEENT: oral mucosa pink and moist, NCAT Cardio: Reg rate Chest: normal effort, normal rate of breathing Abd: soft, non-distended Ext: no edema Skin: intact Neuro: Alert and oriented x3 Musculoskeletal: left knee swollen, with evidence of knee replacement scar. Limited ROM. Psych: pleasant, normal affect    Assessment & Plan:  Mrs. Ikner is a 54 year old woman who presents to establish care for left knee pain post-surgery.  -She has a plan for repeat surgical procedure with Dr. Ninfa Linden that will hopefully provide relief.   -She has been icing regularly- commended her on this.  -She has been through physical therapy and performs her exercises diligently.  -I have refilled Robaxin as she is nervous about feeling too sleepy with the Tizanidine, and the  Robaxin does help.   -Discussed compounding cream for knee: she will let me know which location she would prefer to have it sent to.   -She states Dr. Ninfa Linden would prefer [place] to take over [place] prescription. UDS obtained and pain contract signed today and will contact her once results are available.   -Provided dietary counseling regarding opioid induced constipation.  -She would like rheum panel checked: ordered, reviewed results which were negative, and discussed with patient.  -Referred to podiatry for bunion and orthotic fitting.   -All questions answered. RTC in 4 weeks.   -

## 2020-01-24 ENCOUNTER — Other Ambulatory Visit: Payer: Self-pay | Admitting: Physical Medicine and Rehabilitation

## 2020-01-24 LAB — SEDIMENTATION RATE: Sed Rate: 2 mm/hr (ref 0–40)

## 2020-01-24 LAB — C-REACTIVE PROTEIN: CRP: <1 mg/L (ref 0–10)

## 2020-01-24 LAB — RHEUMATOID FACTOR: Rheumatoid fact SerPl-aCnc: <10 IU/mL (ref 0.0–13.9)

## 2020-01-24 MED ORDER — METHOCARBAMOL 500 MG PO TABS: 500.0000 mg | ORAL_TABLET | Freq: Four times a day (QID) | ORAL | 1 refills | Status: DC | PRN

## 2020-01-25 ENCOUNTER — Encounter: Payer: Self-pay | Admitting: Physical Medicine and Rehabilitation

## 2020-01-28 ENCOUNTER — Other Ambulatory Visit: Payer: Self-pay | Admitting: Physical Medicine and Rehabilitation

## 2020-01-28 LAB — TOXASSURE SELECT,+ANTIDEPR,UR

## 2020-01-28 MED ORDER — HYDROCODONE-ACETAMINOPHEN 5-325 MG PO TABS: 1.0000 | ORAL_TABLET | Freq: Three times a day (TID) | ORAL | 0 refills | Status: DC | PRN

## 2020-01-29 ENCOUNTER — Ambulatory Visit (INDEPENDENT_AMBULATORY_CARE_PROVIDER_SITE_OTHER): Payer: PRIVATE HEALTH INSURANCE | Admitting: Family Medicine

## 2020-01-29 ENCOUNTER — Other Ambulatory Visit: Payer: Self-pay

## 2020-01-29 ENCOUNTER — Encounter: Payer: Self-pay | Admitting: Family Medicine

## 2020-01-29 ENCOUNTER — Telehealth: Payer: Self-pay | Admitting: *Deleted

## 2020-01-29 ENCOUNTER — Other Ambulatory Visit: Payer: Self-pay | Admitting: Family Medicine

## 2020-01-29 VITALS — BP 110/78 | HR 79 | Temp 97.7°F | Ht 67.0 in | Wt 143.2 lb

## 2020-01-29 DIAGNOSIS — F411 Generalized anxiety disorder: Secondary | ICD-10-CM | POA: Diagnosis not present

## 2020-01-29 DIAGNOSIS — I10 Essential (primary) hypertension: Secondary | ICD-10-CM

## 2020-01-29 DIAGNOSIS — Z8619 Personal history of other infectious and parasitic diseases: Secondary | ICD-10-CM | POA: Diagnosis not present

## 2020-01-29 DIAGNOSIS — N941 Unspecified dyspareunia: Secondary | ICD-10-CM

## 2020-01-29 MED ORDER — CLONAZEPAM 0.5 MG PO TABS: 0.5000 mg | ORAL_TABLET | Freq: Every day | ORAL | 0 refills | Status: DC

## 2020-01-29 MED ORDER — NEBIVOLOL HCL 5 MG PO TABS: 5.0000 mg | ORAL_TABLET | Freq: Every day | ORAL | 3 refills | Status: DC

## 2020-01-29 MED ORDER — OMEPRAZOLE 20 MG PO CPDR: 20.0000 mg | DELAYED_RELEASE_CAPSULE | Freq: Two times a day (BID) | ORAL | 3 refills | Status: DC

## 2020-01-29 NOTE — Patient Instructions (Addendum)
Great to meet you!  Return for controlled substance contract and additional refills of clonazepam     [facility] [address],  [place], [place], [address], [place] [phone]  [email or web address]

## 2020-01-29 NOTE — Assessment & Plan Note (Signed)
BP controlled. Discussed that she could consider stopping, but will continue Bystolic.

## 2020-01-29 NOTE — Assessment & Plan Note (Signed)
Not currently needing suppressive therapy. [place] for prescription to take for flares.

## 2020-01-29 NOTE — Progress Notes (Signed)
Subjective:     Sharon [name] is a 54 y.o. female presenting for Establish Care     HPI   #GERD - tried to decrease omeprazole but had return of symptoms immediately - started with pulm - significant symptoms when she stop - not hx of endoscopy   #htn - has been out of medication - taking bystolic with improvement  On narcotics and does not take these at night  For treatment from prior knee surgery with persistent pain  Review of Systems   Social History   Tobacco Use  Smoking Status Never Smoker  Smokeless Tobacco Never Used        Objective:    BP Readings from Last 3 Encounters:  01/29/20 110/78  01/23/20 122/85  11/08/19 115/76   Wt Readings from Last 3 Encounters:  01/29/20 143 lb 4 oz (65 kg)  01/23/20 142 lb 12.8 oz (64.8 kg)  11/08/19 144 lb (65.3 kg)    BP 110/78   Pulse 79   Temp 97.7 F (36.5 C) (Temporal)   Ht 5\' 7"  (1.702 m)   Wt 143 lb 4 oz (65 kg)   LMP 10/11/2010   SpO2 99%   BMI 22.44 kg/m    Physical Exam Constitutional:      General: She is not in acute distress.    Appearance: She is well-developed. She is not diaphoretic.  HENT:     Right Ear: External ear normal.     Left Ear: External ear normal.     Nose: Nose normal.  Eyes:     Conjunctiva/sclera: Conjunctivae normal.  Cardiovascular:     Rate and Rhythm: Normal rate.  Pulmonary:     Effort: Pulmonary effort is normal.  Musculoskeletal:     Cervical back: Neck supple.  Skin:    General: Skin is warm and dry.     Capillary Refill: Capillary refill takes less than 2 seconds.  Neurological:     Mental Status: She is alert. Mental status is at baseline.  Psychiatric:        Mood and Affect: Mood normal.        Behavior: Behavior normal.           Assessment & Plan:   Problem List Items Addressed This Visit      Cardiovascular and Mediastinum   Hypertension    BP controlled. Discussed that she could consider stopping, but will continue  Bystolic.       Relevant Medications   nebivolol (BYSTOLIC) 5 MG tablet     Other   GAD (generalized anxiety disorder) - Primary    Bridge refill provided. Taking at night for sleep. Return for controlled substance contract.       Relevant Medications   clonazePAM (KLONOPIN) 0.5 MG tablet   Hx of herpes genitalis    Not currently needing suppressive therapy. [place] for prescription to take for flares.       Pain in female genitalia on intercourse    Pt with long surgical hx with complications. Advised pelvic floor PT and information for PT who does not take insurance as she has reached her maximum for the year. She will consider assessment          Return for for clonazepam refill .  Lesleigh Noe, MD  This visit occurred during the SARS-CoV-2 public health emergency.  Safety protocols were in place, including screening questions prior to the visit, additional usage of staff PPE, and extensive cleaning of exam  room while observing appropriate contact time as indicated for disinfecting solutions.

## 2020-01-29 NOTE — Telephone Encounter (Signed)
Urine drug screen for this encounter is consistent for prescribed medication 

## 2020-01-29 NOTE — Assessment & Plan Note (Signed)
Bridge refill provided. Taking at night for sleep. Return for controlled substance contract.

## 2020-01-29 NOTE — Assessment & Plan Note (Addendum)
Pt with long surgical hx with complications. Advised pelvic floor PT and information for PT who does not take insurance as she has reached her maximum for the year. She will consider assessment

## 2020-02-03 ENCOUNTER — Other Ambulatory Visit: Payer: Self-pay

## 2020-02-03 ENCOUNTER — Telehealth: Payer: Self-pay | Admitting: *Deleted

## 2020-02-03 ENCOUNTER — Encounter: Payer: Self-pay | Admitting: Family Medicine

## 2020-02-03 ENCOUNTER — Ambulatory Visit (INDEPENDENT_AMBULATORY_CARE_PROVIDER_SITE_OTHER): Payer: PRIVATE HEALTH INSURANCE | Admitting: Family Medicine

## 2020-02-03 VITALS — BP 102/60 | HR 85 | Temp 97.9°F | Ht 67.0 in | Wt 145.2 lb

## 2020-02-03 DIAGNOSIS — G4700 Insomnia, unspecified: Secondary | ICD-10-CM

## 2020-02-03 DIAGNOSIS — F411 Generalized anxiety disorder: Secondary | ICD-10-CM | POA: Diagnosis not present

## 2020-02-03 MED ORDER — CLONAZEPAM 0.5 MG PO TABS: 0.5000 mg | ORAL_TABLET | Freq: Every day | ORAL | 5 refills | Status: DC

## 2020-02-03 MED ORDER — OMEPRAZOLE 20 MG PO CPDR: 20.0000 mg | DELAYED_RELEASE_CAPSULE | Freq: Every day | ORAL | 3 refills | Status: DC

## 2020-02-03 NOTE — Assessment & Plan Note (Signed)
Doing well on current dose of clonazepam. Knows to avoid taking with norco which is prescribed by a different provider. Contract done today. UDS in system is appropriate.

## 2020-02-03 NOTE — Assessment & Plan Note (Signed)
Not currently on SSRI but doing well since not working stressful job. Previously failed zoloft. Discussed that if worsening would avoid additional benzos.

## 2020-02-03 NOTE — Telephone Encounter (Signed)
Patient left a voicemail stating that she just found out that her insurance will not cover her acid reflux medication Omeprazole two times a day. Patient wants to know if Dr. Einar Pheasant will change the directions to once a day. Patient stated that she does not need it right now but would like the directions changed so that her insurance will cover it.

## 2020-02-03 NOTE — Progress Notes (Signed)
   Subjective:     Sharon [name] is a 54 y.o. female presenting for Medication Management (Klonopin )     HPI   #Insomnia - lower doses are not as effective - this current dose works well   #GAD - was previously on zoloft  - it worked for a while and then stopped - family members had bad experience with paxil    Review of Systems   Social History   Tobacco Use  Smoking Status Never Smoker  Smokeless Tobacco Never Used        Objective:    BP Readings from Last 3 Encounters:  02/03/20 102/60  01/29/20 110/78  01/23/20 122/85   Wt Readings from Last 3 Encounters:  02/03/20 145 lb 4 oz (65.9 kg)  01/29/20 143 lb 4 oz (65 kg)  01/23/20 142 lb 12.8 oz (64.8 kg)    BP 102/60   Pulse 85   Temp 97.9 F (36.6 C) (Temporal)   Ht 5\' 7"  (1.702 m)   Wt 145 lb 4 oz (65.9 kg)   LMP 10/11/2010   SpO2 98%   BMI 22.75 kg/m    Physical Exam Constitutional:      General: She is not in acute distress.    Appearance: She is well-developed. She is not diaphoretic.  HENT:     Right Ear: External ear normal.     Left Ear: External ear normal.     Nose: Nose normal.  Eyes:     Conjunctiva/sclera: Conjunctivae normal.  Cardiovascular:     Rate and Rhythm: Normal rate.  Pulmonary:     Effort: Pulmonary effort is normal.  Musculoskeletal:     Cervical back: Neck supple.  Skin:    General: Skin is warm and dry.     Capillary Refill: Capillary refill takes less than 2 seconds.  Neurological:     Mental Status: She is alert. Mental status is at baseline.  Psychiatric:        Mood and Affect: Mood normal.        Behavior: Behavior normal.           Assessment & Plan:   Problem List Items Addressed This Visit      Other   GAD (generalized anxiety disorder)    Not currently on SSRI but doing well since not working stressful job. Previously failed zoloft. Discussed that if worsening would avoid additional benzos.       Relevant Medications    clonazePAM (KLONOPIN) 0.5 MG tablet   Insomnia - Primary    Doing well on current dose of clonazepam. Knows to avoid taking with norco which is prescribed by a different provider. Contract done today. UDS in system is appropriate.           Return in about 6 months (around 08/05/2020).  Lesleigh Noe, MD  This visit occurred during the SARS-CoV-2 public health emergency.  Safety protocols were in place, including screening questions prior to the visit, additional usage of staff PPE, and extensive cleaning of exam room while observing appropriate contact time as indicated for disinfecting solutions.

## 2020-02-03 NOTE — Patient Instructions (Signed)
Refill medication Return 6 months or sooner

## 2020-02-09 ENCOUNTER — Ambulatory Visit (INDEPENDENT_AMBULATORY_CARE_PROVIDER_SITE_OTHER): Payer: PRIVATE HEALTH INSURANCE | Admitting: Podiatry

## 2020-02-09 ENCOUNTER — Other Ambulatory Visit: Payer: Self-pay

## 2020-02-09 ENCOUNTER — Ambulatory Visit (INDEPENDENT_AMBULATORY_CARE_PROVIDER_SITE_OTHER): Payer: PRIVATE HEALTH INSURANCE

## 2020-02-09 DIAGNOSIS — M2011 Hallux valgus (acquired), right foot: Secondary | ICD-10-CM | POA: Diagnosis not present

## 2020-02-09 DIAGNOSIS — M2012 Hallux valgus (acquired), left foot: Secondary | ICD-10-CM

## 2020-02-09 NOTE — Patient Instructions (Signed)
Bunion  A bunion is a bump on the base of the big toe that forms when the bones of the big toe joint move out of position. Bunions may be small at first, but they often get larger over time. They can make walking painful. What are the causes? A bunion may be caused by:  Wearing narrow or pointed shoes that force the big toe to press against the other toes.  Abnormal foot development that causes the foot to roll inward (pronate).  Changes in the foot that are caused by certain diseases, such as rheumatoid arthritis or polio.  A foot injury. What increases the risk? The following factors may make you more likely to develop this condition:  Wearing shoes that squeeze the toes together.  Having certain diseases, such as: ? Rheumatoid arthritis. ? Polio. ? Cerebral palsy.  Having family members who have bunions.  Being born with a foot deformity, such as flat feet or low arches.  Doing activities that put a lot of pressure on the feet, such as ballet dancing. What are the signs or symptoms? The main symptom of a bunion is a noticeable bump on the big toe. Other symptoms may include:  Pain.  Swelling around the big toe.  Redness and inflammation.  Thick or hardened skin on the big toe or between the toes.  Stiffness or loss of motion in the big toe.  Trouble with walking. How is this diagnosed? A bunion may be diagnosed based on your symptoms, medical history, and activities. You may have tests, such as:  X-rays. These allow your health care provider to check the position of the bones in your foot and look for damage to your joint. They also help your health care provider determine the severity of your bunion and the best way to treat it.  Joint aspiration. In this test, a sample of fluid is removed from the toe joint. This test may be done if you are in a lot of pain. It helps rule out diseases that cause painful swelling of the joints, such as arthritis. How is this  treated? Treatment depends on the severity of your symptoms. The goal of treatment is to relieve symptoms and prevent the bunion from getting worse. Your health care provider may recommend:  Wearing shoes that have a wide toe box.  Using bunion pads to cushion the affected area.  Taping your toes together to keep them in a normal position.  Placing a device inside your shoe (orthotics) to help reduce pressure on your toe joint.  Taking medicine to ease pain, inflammation, and swelling.  Applying heat or ice to the affected area.  Doing stretching exercises.  Surgery to remove scar tissue and move the toes back into their normal position. This treatment is rare. Follow these instructions at home: Managing pain, stiffness, and swelling   If directed, put ice on the painful area: ? Put ice in a plastic bag. ? Place a towel between your skin and the bag. ? Leave the ice on for 20 minutes, 2-3 times a day. Activity   If directed, apply heat to the affected area before you exercise. Use the heat source that your health care provider recommends, such as a moist heat pack or a heating pad. ? Place a towel between your skin and the heat source. ? Leave the heat on for 20-30 minutes. ? Remove the heat if your skin turns bright red. This is especially important if you are unable to feel pain,   heat, or cold. You may have a greater risk of getting burned.  Do exercises as told by your health care provider. General instructions  Support your toe joint with proper footwear, shoe padding, or taping as told by your health care provider.  Take over-the-counter and prescription medicines only as told by your health care provider.  Keep all follow-up visits as told by your health care provider. This is important. Contact a health care provider if your symptoms:  Get worse.  Do not improve in 2 weeks. Get help right away if you have:  Severe pain and trouble with walking. Summary  A  bunion is a bump on the base of the big toe that forms when the bones of the big toe joint move out of position.  Bunions can make walking painful.  Treatment depends on the severity of your symptoms.  Support your toe joint with proper footwear, shoe padding, or taping as told by your health care provider. This information is not intended to replace advice given to you by your health care provider. Make sure you discuss any questions you have with your health care provider. Document Revised: 12/10/2017 Document Reviewed: 10/16/2017 Elsevier Patient Education  2020 Elsevier Inc.  

## 2020-02-11 NOTE — Progress Notes (Signed)
Subjective:   Patient ID: Sharon [name], female   DOB: 54 y.o.   MRN: [id number]   HPI Patient states she is getting pain in her left foot and she has a history of bunions.  States that it is gradually becoming harder for her to be active and also she is getting generalized pain in other areas.  Patient states it is been going on for a while and changes in shoe gear and soaks have not been effective   Review of Systems  All other systems reviewed and are negative.       Objective:  Physical Exam Vitals and nursing note reviewed.  Constitutional:      Appearance: She is well-developed.  Pulmonary:     Effort: Pulmonary effort is normal.  Musculoskeletal:        General: Normal range of motion.  Skin:    General: Skin is warm.  Neurological:     Mental Status: She is alert.     Neurovascular status intact muscle strength was found to be adequate range of motion within normal limits.  Patient is noted to have moderate structural deformity left over right with redness around the joint surface and pain with palpation.  Patient does have moderate depression of the arch also noted with inflammation     Assessment:  Inflammatory condition with probability for tendinitis and structural bunion     Plan:  H&P reviewed all conditions with her discussing the different treatments that she has had.  At this point we will try anti-inflammatories we will try reduced activity and soaks along with stretching exercises and see the response  X-rays indicate structural malalignment with moderate depression of the arch noted bilateral

## 2020-02-22 ENCOUNTER — Other Ambulatory Visit: Payer: Self-pay | Admitting: Orthopaedic Surgery

## 2020-03-03 ENCOUNTER — Encounter
Payer: PRIVATE HEALTH INSURANCE | Attending: Physical Medicine and Rehabilitation | Admitting: Physical Medicine and Rehabilitation

## 2020-03-03 ENCOUNTER — Encounter: Payer: Self-pay | Admitting: Physical Medicine and Rehabilitation

## 2020-03-03 ENCOUNTER — Other Ambulatory Visit: Payer: Self-pay

## 2020-03-03 VITALS — BP 110/75 | HR 73 | Temp 98.9°F | Ht 67.0 in | Wt 144.0 lb

## 2020-03-03 DIAGNOSIS — G894 Chronic pain syndrome: Secondary | ICD-10-CM

## 2020-03-03 DIAGNOSIS — Z5181 Encounter for therapeutic drug level monitoring: Secondary | ICD-10-CM | POA: Diagnosis present

## 2020-03-03 DIAGNOSIS — M25562 Pain in left knee: Secondary | ICD-10-CM | POA: Diagnosis present

## 2020-03-03 DIAGNOSIS — M25561 Pain in right knee: Secondary | ICD-10-CM

## 2020-03-03 DIAGNOSIS — M21612 Bunion of left foot: Secondary | ICD-10-CM | POA: Diagnosis present

## 2020-03-03 DIAGNOSIS — Z79891 Long term (current) use of opiate analgesic: Secondary | ICD-10-CM | POA: Diagnosis present

## 2020-03-03 DIAGNOSIS — G8929 Other chronic pain: Secondary | ICD-10-CM

## 2020-03-03 MED ORDER — HYDROCODONE-ACETAMINOPHEN 5-325 MG PO TABS: 1.0000 | ORAL_TABLET | Freq: Every day | ORAL | 0 refills | Status: DC | PRN

## 2020-03-03 MED ORDER — METHOCARBAMOL 500 MG PO TABS: 500.0000 mg | ORAL_TABLET | Freq: Four times a day (QID) | ORAL | 1 refills | Status: DC | PRN

## 2020-03-03 NOTE — Patient Instructions (Addendum)
[email or web address]   1) Ginger 2) Blueberries 3) Salmon 4) Pumpkin seeds 5) dark chocolate 6) turmeric 7) tart cherries 8) virgin olive oil 9) chilli peppers 10) mint 11) red wine  [place]ois tea    Turmeric to reduce inflammation--can be used in cooking or taken as a supplement.  Turmeric Milk Recipe:  1 cup milk  1 tsp turmeric  1 tsp cinnamon  1 tsp grated ginger (optional)  Black pepper (boosts the anti-inflammatory properties of turmeric).  1 tsp honey

## 2020-03-03 NOTE — Progress Notes (Signed)
Subjective:    Patient ID: Sharon [name], female    DOB: 06/15/1966, 54 y.o.   MRN: [id number]  HPI Mrs. Banbury is a 54 year old woman who presents to establish care for left knee pain following left knee replacement in 04/2019.   She has been trying to improve quadriceps musculature. She discusses some of the exercises she does.   She has been denied in her disability. She has talked to an attorney.  She continues to ambulate with a cane.   She has tried the compounding cream and that does not help much. She has been trying Hemp cream which is helping.  She is getting foot insoles from the podiatrist. They should be ready in a week or two.   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   Can ride the stationary bicycle and that does not hurt. She sues it every day. She uses leg weights. When   Scar tissue grew back quickly post-surgery. She could not get PT appointments for 3 weeks.   Her surgeon does not want her to break the hardware and he may have to do arthroscopic procedure to flush out the inflammation.   She has tried ice which stopped helping, nabumetone 750 mg BID, Norco 1-2 tablets three times per day PRN (she has 12 left). She uses diclofenac gel   She takes Miralax for constipation. She takes up to 2 times per day. She has a history of IBS.   [facility] in [place]- she was a Research scientist (physical sciences). They let her go because she did not return in 12 weeks.   She was doing pool therapy but chemicals in the pool were increased because of COVID.  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 Notes reviewed- recently had a right subacromial injection.   Current pain is 8/10.    Pain Inventory Average Pain 9 Pain Right Now 3 My pain is constant, sharp, burning and aching  In the last 24 hours, has pain interfered with the following? General  activity 9 Relation with others 9 Enjoyment of life 9 What TIME of day is your pain at its worst? daytime, evening Sleep (in general) Fair  Pain is worse with: walking, bending and standing Pain improves with: rest and medication Relief from Meds: 8  Mobility walk with assistance use a cane how many minutes can you walk?  15 mins ability to climb steps?  no do you drive?  yes Do you have any goals in this area?  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  yes  Neuro/Psych tremor trouble walking spasms anxiety  Prior Studies Any changes since last visit?  yes  Physicians involved in your care New Patient   Family History  Problem Relation Age of Onset  . Emphysema Mother        smoker  . Allergies Mother   . Asthma Mother   . Heart disease Mother        AMI as cause of death  . COPD Mother   . Asthma Father   . Heart disease Father        AMI as cause of death  . Diabetes Father   .  Asthma Brother   . Heart disease Brother 19       heart failure  . Lung cancer Maternal Grandfather        was a smoker  . Cancer Maternal Grandfather        lung  . Heart disease Paternal Grandmother   . Heart disease Paternal Grandfather   . Colon cancer Neg Hx    Social History   Socioeconomic History  . Marital status: Soil scientist    Spouse name: Not on file  . Number of children: 1  . Years of education: Not on file  . Highest education level: Not on file  Occupational History  . Occupation: Restaurant manager, fast food: [employer]  Tobacco Use  . Smoking status: Never Smoker  . Smokeless tobacco: Never Used  Vaping Use  . Vaping Use: Never used  Substance and Sexual Activity  . Alcohol use: Not Currently    Comment: occ  . Drug use: No    Comment: former maryjuana  use- quit in 1995  . Sexual activity: Yes    Birth control/protection: Surgical  Other Topics Concern  . Not on file  Social History Narrative   Marital status: divorced x 2; dating seriously x 3 years.        Children:  1 daughter (67); no grandchildren      Lives:  With boyfriend.        Employment: works at [employer]: none      Alcohol: rarely; socially      Exercise: none; has treadmill and elliptical and bikes   Coffee in am / 1/2 of coke in afternoon    High school education      01/29/20   From: the area   Living: alone - but boyfriend living with her Wilfred Lacy (2020) but knew each other in high school   Work: on disability - working on appeal      Family: one daughter [name] - one grandchild coming in January       Enjoys: relax and watch tv, use to like fishing/boating/rafting, reading, crochet       Exercise: rehab exercises   Diet: not great - limited intake      Safety   Seat belts: Yes    Guns: Yes  and secure   Safe in relationships: Yes    Social Determinants of Health   Financial Resource Strain:   . Difficulty of Paying Living Expenses: Not on file  Food Insecurity:   . Worried About Charity fundraiser in the Last Year: Not on file  . Ran Out of Food in the Last Year: Not on file  Transportation Needs:   . Lack of Transportation (Medical): Not on file  . Lack of Transportation (Non-Medical): Not on file  Physical Activity:   . Days of Exercise per Week: Not on file  . Minutes of Exercise per Session: Not on file  Stress:   . Feeling of Stress : Not on file  Social Connections:   . Frequency of Communication with Friends and Family: Not on file  . Frequency of Social Gatherings with Friends and Family: Not on file  . Attends Religious Services: Not on file  . Active Member of Clubs or Organizations: Not on file  . Attends Archivist Meetings: Not on file  . Marital Status: Not on file   Past Surgical History:  Procedure  Laterality  Date  . ABDOMINAL HYSTERECTOMY    . CATARACT EXTRACTION Bilateral 2015  . COLONOSCOPY    . CYSTOSCOPY W/ URETERAL STENT PLACEMENT Right 05/23/2017   Procedure: CYSTOSCOPY WITH RETROGRADE PYELOGRAM/URETERAL RIGHT STENT PLACEMENT;  Surgeon: Bjorn Loser, MD;  Location: [facility];  Service: Urology;  Laterality: Right;  . CYSTOSCOPY W/ URETERAL STENT PLACEMENT Right 06/04/2017   Procedure: CYSTOSCOPY WITHRIGHT RETROGRADE PYELOGRAMRIGHT /URETEREOSCOPY  STENT PLACEMENT;  Surgeon: Lucas Mallow, MD;  [facility];  Service: Urology;  Laterality: Right;  . ENDOMETRIAL ABLATION    . EYE SURGERY Bilateral    cataract surgery with lens implants  . [place]  2011  . INCONTINENCE SURGERY    . INNER EAR SURGERY     X 2  . KNEE SURGERY    . RECTAL PROLAPSE REPAIR    . RETINAL DETACHMENT SURGERY Bilateral 2000  . RETINAL DETACHMENT SURGERY Bilateral   . TOTAL KNEE ARTHROPLASTY Left 04/29/2019  . TOTAL KNEE ARTHROPLASTY Left 04/29/2019   Procedure: LEFT TOTAL KNEE ARTHROPLASTY;  Surgeon: Mcarthur Rossetti, MD;  Location: [facility];  Service: Orthopedics;  Laterality: Left;  . TUBAL LIGATION    . VEIN SURGERY Left 04/2010   Past Medical History:  Diagnosis Date  . Allergy   . Anal fissure   . Anemia    borderline  . Anxiety   . Arthritis   . Blood clot in vein    left leg  . Detached retina    BOTH SCLERA BUCKLE BOTH EYES  . DVT (deep venous thrombosis) (HCC)    left leg  . Endometriosis   . Family history of adverse reaction to anesthesia    DAUGHTER POST OP PONV  . GERD (gastroesophageal reflux disease)   . Glaucoma    RIGHT WORST THAN LEFT  . Heart murmur    "caused by anxiety"   . History of hemorrhoids   . History of kidney stones   . Hypertension   . IBS (irritable bowel syndrome)   . Perforated eardrum, right    SMALL HOLE  . PONV (postoperative nausea and vomiting)   . Pulmonary embolism ([place]) 6 YRS AGO   LMP  10/11/2010   Opioid Risk Score:   Fall Risk Score:  `1  Depression screen PHQ 2/9  Depression screen [facility] 2/9 01/29/2020 01/23/2020 06/09/2019 01/20/2019 07/22/2018 07/19/2018 01/01/2018  Decreased Interest 3 3 0 0 0 0 0  Down, Depressed, Hopeless 2 3 0 0 0 0 0  PHQ - 2 Score 5 6 0 0 0 0 0  Altered sleeping 2 3 - - - - -  Tired, decreased energy 2 3 - - - - -  Change in appetite 2 3 - - - - -  Feeling bad or failure about yourself  0 0 - - - - -  Trouble concentrating 1 1 - - - - -  Moving slowly or fidgety/restless 0 0 - - - - -  Suicidal thoughts 0 0 - - - - -  PHQ-9 Score 12 16 - - - - -  Difficult doing work/chores Very difficult - - - - - -  Some recent data might be hidden   Review of Systems  Constitutional: Negative.        Weight loss  HENT: Negative.   Eyes: Negative.   Respiratory: Positive for shortness of breath.   Cardiovascular: Negative.   Gastrointestinal: Positive for abdominal pain, constipation and nausea.  Endocrine: Negative.  Genitourinary: Negative.   Musculoskeletal: Positive for back pain and gait problem.       Spasms  Skin: Negative.   Allergic/Immunologic: Negative.   Neurological: Positive for tremors.  Hematological: Negative.   Psychiatric/Behavioral:       Anxiety  All other systems reviewed and are negative.      Objective:   Physical Exam Gen: no distress, normal appearing HEENT: oral mucosa pink and moist, NCAT Cardio: Reg rate Chest: normal effort, normal rate of breathing Abd: soft, non-distended Ext: no edema Skin: intact Neuro: Alert and oriented x3 Musculoskeletal: left knee swollen, with evidence of knee replacement scar. Limited ROM. Psych: pleasant, normal affect    Assessment & Plan:  Mrs. Wirsing is a 54 year old woman who presents to establish care for   1) Left knee pain post-surgery:  -She has a plan for repeat surgical procedure with Dr. Ninfa Linden that will hopefully provide relief.   -She has been icing regularly-  commended her on this.  -She has been through physical therapy and performs her exercises diligently.  -I have refilled Robaxin as she is nervous about feeling too sleepy with the Tizanidine, and the Robaxin does help.   -Provided refill of hydrocodone. Up to 5 pills per day as needed. PDMP reviewed and patient is not receiving this medication from any other providers.   -She states Dr. Ninfa Linden would prefer [place] to take over [place] prescription. UDS obtained and pain contract signed today and will contact her once results are available.   -Provided dietary counseling regarding opioid induced constipation.  -She would like rheum panel checked: ordered, reviewed results which were negative, and discussed with patient.  Bunion: Referred to podiatry for bunion and orthotic fitting. She will be receiving these in 2 weeks.   Irritable bowel syndrome: She does not eat much sugar or sweets. She drinks 1 coke per day. She cuts tea out. She drinks water, coffee.   Concentration: She feels that her concentration may have been impacted by her opioid. Discussed that we can wean her off the narcotics if she would like.   -All questions answered. RTC in 4 weeks.   -

## 2020-03-05 ENCOUNTER — Other Ambulatory Visit: Payer: Self-pay | Admitting: Orthopaedic Surgery

## 2020-03-08 ENCOUNTER — Other Ambulatory Visit: Payer: Self-pay | Admitting: Obstetrics and Gynecology

## 2020-03-08 ENCOUNTER — Other Ambulatory Visit: Payer: Self-pay

## 2020-03-08 ENCOUNTER — Ambulatory Visit (INDEPENDENT_AMBULATORY_CARE_PROVIDER_SITE_OTHER): Payer: PRIVATE HEALTH INSURANCE | Admitting: Orthotics

## 2020-03-08 DIAGNOSIS — M2011 Hallux valgus (acquired), right foot: Secondary | ICD-10-CM

## 2020-03-08 DIAGNOSIS — Z1231 Encounter for screening mammogram for malignant neoplasm of breast: Secondary | ICD-10-CM

## 2020-03-08 DIAGNOSIS — M2012 Hallux valgus (acquired), left foot: Secondary | ICD-10-CM | POA: Diagnosis not present

## 2020-03-08 NOTE — Progress Notes (Signed)
Patient came in today to pick up custom made foot orthotics.  The goals were accomplished and the patient reported no dissatisfaction with said orthotics.  Patient was advised of breakin period and how to report any issues. 

## 2020-03-08 NOTE — Telephone Encounter (Signed)
Refill

## 2020-03-16 ENCOUNTER — Ambulatory Visit
Admission: RE | Admit: 2020-03-16 | Discharge: 2020-03-16 | Disposition: A | Discharge status: Home / Self Care | Payer: PRIVATE HEALTH INSURANCE | Source: Ambulatory Visit | Attending: Obstetrics and Gynecology | Admitting: Obstetrics and Gynecology

## 2020-03-16 ENCOUNTER — Other Ambulatory Visit: Payer: Self-pay

## 2020-03-16 DIAGNOSIS — Z1231 Encounter for screening mammogram for malignant neoplasm of breast: Secondary | ICD-10-CM

## 2020-03-17 ENCOUNTER — Other Ambulatory Visit: Payer: Self-pay | Admitting: Obstetrics and Gynecology

## 2020-03-17 DIAGNOSIS — R234 Changes in skin texture: Secondary | ICD-10-CM

## 2020-03-22 ENCOUNTER — Ambulatory Visit: Payer: PRIVATE HEALTH INSURANCE

## 2020-03-22 ENCOUNTER — Ambulatory Visit (INDEPENDENT_AMBULATORY_CARE_PROVIDER_SITE_OTHER): Payer: PRIVATE HEALTH INSURANCE | Admitting: Orthopaedic Surgery

## 2020-03-22 ENCOUNTER — Encounter: Payer: Self-pay | Admitting: Orthopaedic Surgery

## 2020-03-22 DIAGNOSIS — Z96652 Presence of left artificial knee joint: Secondary | ICD-10-CM

## 2020-03-22 NOTE — Progress Notes (Signed)
Office Visit Note   Patient: Sharon [name]           Date of Birth: 1965/06/27           MRN: [id number] Visit Date: 03/22/2020              Requested by: Forrest Moron, MD [phone] [place],  [place] [address] PCP: Lesleigh Noe, MD   Assessment & Plan: Visit Diagnoses:  1. History of left knee replacement     Plan: At this point from an orthopedic standpoint, I would recommend an open arthrotomy with lysis of adhesions and removal of scar tissue and a polyliner exchange.  I let her know that this would not be as painful surgery given the fact that we do not cut bone and people usually do well from surgery such as this.  However, given her significant pain in the past as well as her need to be on narcotics now to control her pain, I could not state that she would not have some pain from surgery.  With surgery such as this, we would recommend aggressive physical therapy after this type of surgery as well as a CPM for the knee.  I do agree with the fact that she is unable to work due to her pain and the inability to concentrate well.  She would not benefit from either sedentary or manual labor due to her pain and being on pain medication combined with inability to concentrate well.  This does call for her looking into permanent disability.  Hopefully, she will improve with time.  I am skeptical and do feel that she will benefit from the a forementioned procedure for her knee.  She will think about it.  She would rather wait to see me after the first of the year which I think is reasonable.  Follow-up can be in 4 weeks.  Follow-Up Instructions: Return in about 4 months (around 07/23/2020).   Orders:  Orders Placed This Encounter  Procedures  . XR Knee 1-2 Views Left   No orders of the defined types were placed in this encounter.     Procedures: No procedures performed   Clinical Data: No additional findings.   Subjective: Chief Complaint    Patient presents with  . Left Knee - Follow-up  The patient is now 11 months status post a left total knee arthroplasty.  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  She is also now in chronic pain management and has had to get back on hydrocodone.  She reports the inability to concentrate well secondary to the pain she is having and being on pain medications and she reports some short-term memory issues.  She still cannot get her knee bending is much as she would like to get it bending and moving.  HPI  Review of Systems She currently denies any headache or chest pain.  She denies any shortness of breath, fever, chills, nausea, vomiting  Objective: Vital Signs: LMP 10/11/2010   Physical Exam She is alert and oriented in my office and in no acute distress Ortho Exam Examination of her left knee shows that it is ligamentously stable.  She lacks full extension by just a few degrees when you look at it from the front but from behind it appears straight.  However her flexion is barely past 90 degrees.  There is  no evidence of infection.  Her calf is soft.  The patella seems to mobilize well. Specialty Comments:  No specialty comments available.  Imaging: XR Knee 1-2 Views Left  Result Date: 03/22/2020 2 views of the left knee show well-seated total knee arthroplasty with no worrisome features and no evidence of loosening.    PMFS History: Patient Active Problem List   Diagnosis Date Noted  . Insomnia 02/03/2020  . Hx of herpes genitalis 01/29/2020  . Pain in female genitalia on intercourse 01/29/2020  . Status post total left knee replacement 04/29/2019  . Impingement syndrome of right shoulder 10/28/2018  . Chronic pain of left knee 03/27/2018  . Chronic right shoulder pain 03/27/2018  . Cervicalgia 03/27/2018  . Exposure of implanted vaginal mesh ([place]) 03/06/2018  . Outlet dysfunction  constipation 03/06/2018  . Status post arthroscopy of left knee 08/06/2017  . Unilateral primary osteoarthritis, left knee 08/06/2017  . Tremor 07/10/2017  . Other meniscus derangements, posterior horn of medial meniscus, left knee 06/28/2017  . Ureteral stone 05/23/2017  . Acute pain of left knee 04/09/2017  . Trochanteric bursitis, right hip 09/27/2016  . GAD (generalized anxiety disorder) 05/31/2014  . Rectocele 08/21/2011  . Uterovaginal prolapse 08/21/2011  . Dyspnea 10/13/2010  . Hypertension 10/13/2010   Past Medical History:  Diagnosis Date  . Allergy   . Anal fissure   . Anemia    borderline  . Anxiety   . Arthritis   . Blood clot in vein    left leg  . Detached retina    BOTH SCLERA BUCKLE BOTH EYES  . DVT (deep venous thrombosis) (HCC)    left leg  . Endometriosis   . Family history of adverse reaction to anesthesia    DAUGHTER POST OP PONV  . GERD (gastroesophageal reflux disease)   . Glaucoma    RIGHT WORST THAN LEFT  . Heart murmur    "caused by anxiety"   . History of hemorrhoids   . History of kidney stones   . Hypertension   . IBS (irritable bowel syndrome)   . Perforated eardrum, right    SMALL HOLE  . PONV (postoperative nausea and vomiting)   . Pulmonary embolism ([place]) 6 YRS AGO    Family History  Problem Relation Age of Onset  . Emphysema Mother        smoker  . Allergies Mother   . Asthma Mother   . Heart disease Mother        AMI as cause of death  . COPD Mother   . Asthma Father   . Heart disease Father        AMI as cause of death  . Diabetes Father   . Asthma Brother   . Heart disease Brother 42       heart failure  . Lung cancer Maternal Grandfather        was a smoker  . Cancer Maternal Grandfather        lung  . Heart disease Paternal Grandmother   . Heart disease Paternal Grandfather   . Colon cancer Neg Hx     Past Surgical History:  Procedure Laterality Date  . ABDOMINAL HYSTERECTOMY    . CATARACT EXTRACTION  Bilateral 2015  . COLONOSCOPY    . CYSTOSCOPY W/ URETERAL STENT PLACEMENT Right 05/23/2017   Procedure: CYSTOSCOPY WITH RETROGRADE PYELOGRAM/URETERAL RIGHT STENT PLACEMENT;  Surgeon: Bjorn Loser, MD;  Location: [facility];  Service: Urology;  Laterality: Right;  . CYSTOSCOPY W/  URETERAL STENT PLACEMENT Right 06/04/2017   Procedure: CYSTOSCOPY WITHRIGHT RETROGRADE PYELOGRAMRIGHT /URETEREOSCOPY  STENT PLACEMENT;  Surgeon: Lucas Mallow, MD;  Location[facility];  Service: Urology;  Laterality: Right;  . ENDOMETRIAL ABLATION    . EYE SURGERY Bilateral    cataract surgery with lens implants  . [place]  2011  . INCONTINENCE SURGERY    . INNER EAR SURGERY     X 2  . KNEE SURGERY    . RECTAL PROLAPSE REPAIR    . RETINAL DETACHMENT SURGERY Bilateral 2000  . RETINAL DETACHMENT SURGERY Bilateral   . TOTAL KNEE ARTHROPLASTY Left 04/29/2019  . TOTAL KNEE ARTHROPLASTY Left 04/29/2019   Procedure: LEFT TOTAL KNEE ARTHROPLASTY;  Surgeon: Mcarthur Rossetti, MD;  Location: [facility];  Service: Orthopedics;  Laterality: Left;  . TUBAL LIGATION    . VEIN SURGERY Left 04/2010   Social History   Occupational History  . Occupation: Restaurant manager, fast food: [employer]  Tobacco Use  . Smoking status: Never Smoker  . Smokeless tobacco: Never Used  Vaping Use  . Vaping Use: Never used  Substance and Sexual Activity  . Alcohol use: Not Currently    Comment: occ  . Drug use: No    Comment: former maryjuana use- quit in 1995  . Sexual activity: Yes    Birth control/protection: Surgical

## 2020-03-31 DIAGNOSIS — Z90710 Acquired absence of both cervix and uterus: Secondary | ICD-10-CM | POA: Insufficient documentation

## 2020-03-31 DIAGNOSIS — N809 Endometriosis, unspecified: Secondary | ICD-10-CM | POA: Insufficient documentation

## 2020-03-31 DIAGNOSIS — R87619 Unspecified abnormal cytological findings in specimens from cervix uteri: Secondary | ICD-10-CM | POA: Insufficient documentation

## 2020-03-31 DIAGNOSIS — I82409 Acute embolism and thrombosis of unspecified deep veins of unspecified lower extremity: Secondary | ICD-10-CM | POA: Insufficient documentation

## 2020-03-31 DIAGNOSIS — D352 Benign neoplasm of pituitary gland: Secondary | ICD-10-CM | POA: Insufficient documentation

## 2020-04-05 ENCOUNTER — Telehealth: Payer: Self-pay | Admitting: Family Medicine

## 2020-04-05 ENCOUNTER — Other Ambulatory Visit: Payer: Self-pay | Admitting: Obstetrics and Gynecology

## 2020-04-05 DIAGNOSIS — F411 Generalized anxiety disorder: Secondary | ICD-10-CM

## 2020-04-05 DIAGNOSIS — R234 Changes in skin texture: Secondary | ICD-10-CM

## 2020-04-06 NOTE — Telephone Encounter (Addendum)
Pt already has my chart video visit set for 3 mth FU appt on 04/13/20. unable to reach pt by phone for additional info.sending note to Dr Einar Pheasant and Alyse Low CMA and [facility] LPN.

## 2020-04-06 NOTE — Telephone Encounter (Signed)
[facility] - Client TELEPHONE ADVICE RECORD AccessNurse Patient Name: Sharon [name] Gender: Female DOB: May 03, 1966 Age: 54 Y 56 M 4 D Return Phone Number: [phone] (Primary) Address: City/State/Zip: [place] [address] Client [facility] - Client Client Site [place] - Day Physician Waunita Schooner- MD Contact Type Call Who Is Calling Patient / Member / Family / Caregiver Call Type Triage / Clinical Relationship To Patient Self Return Phone Number [phone] (Primary) Chief Complaint CHEST PAIN (>=21 years) - pain, pressure, heaviness or tightness Reason for Call Symptomatic / Request for [place] states she is experiencing chest heaviness and trouble breathing. Translation No Nurse Assessment Nurse: Ysidro Evert, RN, Levada Dy Date/Time (Eastern Time): 04/06/2020 1:34:46 PM Confirm and document reason for call. If symptomatic, describe symptoms. ---Caller states she has been having shortness of breath and it seems hard for her to breath for the past month. She has a cough that her doctor said was from acid reflux. No fever Does the patient have any new or worsening symptoms? ---Yes Will a triage be completed? ---Yes Related visit to physician within the last 2 weeks? ---No Does the PT have any chronic conditions? (i.e. diabetes, asthma, this includes High risk factors for pregnancy, etc.) ---No Is this a behavioral health or substance abuse call? ---No Guidelines Guideline Title Affirmed Question Affirmed Notes Nurse Date/Time (Eastern Time) Breathing Difficulty [1] MODERATE difficulty breathing (e.g., speaks in phrases, SOB even at rest, pulse 100-120) AND [2] NEWonset or WORSE than normal Ysidro Evert, Environmental consultant 04/06/2020 1:37:55 PM Disp. Time Eilene Ghazi Time) Disposition Final User 04/06/2020 1:32:36 PM Send to Urgent Queue Josephine Cables 04/06/2020 1:41:16  PM Go to ED Now Yes Ysidro Evert, RN, Levada Dy PLEASE NOTE: All timestamps contained within this report are represented as [place] Standard Time. CONFIDENTIALTY NOTICE: This fax transmission is intended only for the addressee. It contains information that is legally privileged, confidential or otherwise protected from use or disclosure. If you are not the intended recipient, you are strictly prohibited from reviewing, disclosing, copying using or disseminating any of this information or taking any action in reliance on or regarding this information. If you have received this fax in error, please notify us immediately by telephone so that we can arrange for its return to [place]. Phone: [phone], Toll-Free: [phone], Fax: [phone] Page: 2 of 2 Call Id: [id number] [place] Disagree/Comply Disagree Caller Understands Yes PreDisposition Did not know what to do Care Advice Given Per Guideline GO TO ED NOW: * You need to be seen in the Emergency Department. * Go to the ED at ___________ [place] now. Drive carefully. * Another adult should drive. CARE ADVICE given per Breathing Difficulty (Adult) guideline. BRING MEDICINES: * Bring a list of your current medicines when you go to the Emergency Department (ER). Referrals GO TO FACILITY REFUSED

## 2020-04-06 NOTE — Telephone Encounter (Signed)
Pt schedule 10/26 virtual  Sent pt to traige she stated she was having problems breathing and heaviness in chest

## 2020-04-07 ENCOUNTER — Other Ambulatory Visit: Payer: Self-pay | Admitting: Family Medicine

## 2020-04-08 NOTE — Telephone Encounter (Signed)
Noted will see patient next week

## 2020-04-09 ENCOUNTER — Other Ambulatory Visit: Payer: Self-pay

## 2020-04-09 ENCOUNTER — Encounter
Payer: PRIVATE HEALTH INSURANCE | Attending: Physical Medicine and Rehabilitation | Admitting: Physical Medicine and Rehabilitation

## 2020-04-09 ENCOUNTER — Encounter: Payer: Self-pay | Admitting: Physical Medicine and Rehabilitation

## 2020-04-09 VITALS — Temp 99.5°F | Ht 67.0 in | Wt 147.4 lb

## 2020-04-09 DIAGNOSIS — M7551 Bursitis of right shoulder: Secondary | ICD-10-CM | POA: Diagnosis not present

## 2020-04-09 DIAGNOSIS — G8929 Other chronic pain: Secondary | ICD-10-CM | POA: Diagnosis not present

## 2020-04-09 DIAGNOSIS — M25562 Pain in left knee: Secondary | ICD-10-CM | POA: Diagnosis not present

## 2020-04-09 DIAGNOSIS — G894 Chronic pain syndrome: Secondary | ICD-10-CM | POA: Diagnosis not present

## 2020-04-09 DIAGNOSIS — M21612 Bunion of left foot: Secondary | ICD-10-CM

## 2020-04-09 MED ORDER — HYDROCODONE-ACETAMINOPHEN 5-325 MG PO TABS: 1.0000 | ORAL_TABLET | Freq: Every day | ORAL | 0 refills | Status: DC | PRN

## 2020-04-09 NOTE — Progress Notes (Addendum)
Subjective:    Patient ID: Sharon [name], female    DOB: Mar 15, 1966, 54 y.o.   MRN: [id number]  HPI  Due to national recommendations of social distancing because of COVID 33, an audio/video tele-health visit is felt to be the most appropriate encounter for this patient at this time. See MyChart message from today for the patient's consent to a tele-health encounter with [facility]. This is a follow up tele-visit via Webex. The patient is at home. MD is at office.   Sharon [name] is a 54 year old woman who presents to establish care for left knee pain following left knee replacement in 04/2019.   Current dose of Norco is working well for her. It causes her to have hot flashes but she would still like to continue with this dose.  Discussed Sprint PNS with her again and she still does not like the thought of this option.  She did feel that some of the scar tissue has broken.   Prior history: She has been trying to improve quadriceps musculature. She discusses some of the exercises she does.   She has been denied in her disability. She has talked to an attorney.  She continues to ambulate with a cane.   She has tried the compounding cream and that does not help much. She has been trying Hemp cream which is helping.  She is getting foot insoles from the podiatrist. They should be ready in a week or two.   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   Can ride the stationary bicycle and that does not hurt. She sues it every day. She uses leg weights. When   Scar tissue grew back quickly post-surgery. She could not get PT appointments for 3 weeks.   Her surgeon does not want her to break the hardware and he may have to do arthroscopic procedure to flush out the inflammation.   She has tried ice which stopped helping, nabumetone 750 mg BID, Norco  1-2 tablets three times per day PRN (she has 12 left). She uses diclofenac gel   She takes Miralax for constipation. She takes up to 2 times per day. She has a history of IBS.   [facility] in [place]- she was a Research scientist (physical sciences). They let her go because she did not return in 12 weeks.   She was doing pool therapy but chemicals in the pool were increased because of COVID.  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 Notes reviewed- recently had a right subacromial injection.   Current pain is 8/10.    Pain Inventory Average Pain 8 Pain Right Now 6 My pain is intermittent, sharp, burning and aching  In the last 24 hours, has pain interfered with the following? General activity 10 Relation with others 10 Enjoyment of life 10 What TIME of day is your pain at its worst? daytime Sleep (in general) Fair  Pain is worse with: walking, bending, sitting and standing Pain improves with: rest, heat/ice, therapy/exercise and medication Relief from Meds: 8  Mobility walk with assistance use a cane how many minutes can you walk?  15 mins ability to climb steps?  no do you drive?  yes Do you have any goals in this area?  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  yes  Neuro/Psych tremor trouble walking spasms anxiety  Prior Studies Any changes since last visit?  yes  Physicians involved in your care New Patient   Family History  Problem Relation Age of Onset  . Emphysema Mother        smoker  . Allergies Mother   . Asthma Mother   . Heart disease Mother        AMI as cause of death  . COPD Mother   . Asthma Father   . Heart disease Father        AMI as cause of death  . Diabetes Father   . Asthma Brother   . Heart disease Brother 105       heart failure  . Lung cancer Maternal Grandfather         was a smoker  . Cancer Maternal Grandfather        lung  . Heart disease Paternal Grandmother   . Heart disease Paternal Grandfather   . Colon cancer Neg Hx    Social History   Socioeconomic History  . Marital status: Soil scientist    Spouse name: Not on file  . Number of children: 1  . Years of education: Not on file  . Highest education level: Not on file  Occupational History  . Occupation: Restaurant manager, fast food: [employer]  Tobacco Use  . Smoking status: Never Smoker  . Smokeless tobacco: Never Used  Vaping Use  . Vaping Use: Never used  Substance and Sexual Activity  . Alcohol use: Not Currently    Comment: occ  . Drug use: No    Comment: former maryjuana use- quit in 1995  . Sexual activity: Yes    Birth control/protection: Surgical  Other Topics Concern  . Not on file  Social History Narrative   Marital status: divorced x 2; dating seriously x 3 years.        Children:  1 daughter (10); no grandchildren      Lives:  With boyfriend.        Employment: works at [employer]: none      Alcohol: rarely; socially      Exercise: none; has treadmill and elliptical and bikes   Coffee in am / 1/2 of coke in afternoon    High school education      01/29/20   From: the area   Living: alone - but boyfriend living with her Wilfred Lacy (2020) but knew each other in high school   Work: on disability - working on appeal      Family: one daughter [name] - one grandchild coming in January       Enjoys: relax and watch tv, use to like fishing/boating/rafting, reading, crochet       Exercise: rehab exercises   Diet: not great - limited intake      Safety   Seat belts: Yes    Guns: Yes  and secure   Safe in relationships: Yes    Social Determinants of Health   Financial Resource Strain:   . Difficulty of Paying Living Expenses: Not on file  Food Insecurity:   . Worried About Charity fundraiser in the Last Year: Not on file   . Ran Out of Food in the Last Year: Not on file  Transportation Needs:   . Film/video editor (Medical): Not on file  . Lack of Transportation (Non-Medical): Not on file  Physical Activity:   . Days of Exercise per Week: Not on file  . Minutes of Exercise per Session: Not on file  Stress:   . Feeling of Stress : Not on file  Social Connections:   . Frequency of Communication with Friends and Family: Not on file  . Frequency of Social Gatherings with Friends and Family: Not on file  . Attends Religious Services: Not on file  . Active Member of Clubs or Organizations: Not on file  . Attends Archivist Meetings: Not on file  . Marital Status: Not on file   Past Surgical History:  Procedure Laterality Date  . ABDOMINAL HYSTERECTOMY    . CATARACT EXTRACTION Bilateral 2015  . COLONOSCOPY    . CYSTOSCOPY W/ URETERAL STENT PLACEMENT Right 05/23/2017   Procedure: CYSTOSCOPY WITH RETROGRADE PYELOGRAM/URETERAL RIGHT STENT PLACEMENT;  Surgeon: Bjorn Loser, MD;  [facility];  Service: Urology;  Laterality: Right;  . CYSTOSCOPY W/ URETERAL STENT PLACEMENT Right 06/04/2017   Procedure: CYSTOSCOPY WITHRIGHT RETROGRADE PYELOGRAMRIGHT /URETEREOSCOPY  STENT PLACEMENT;  Surgeon: Lucas Mallow, MD;  Location: [facility];  Service: Urology;  Laterality: Right;  . ENDOMETRIAL ABLATION    . EYE SURGERY Bilateral    cataract surgery with lens implants  . [place]  2011  . INCONTINENCE SURGERY    . INNER EAR SURGERY     X 2  . KNEE SURGERY    . RECTAL PROLAPSE REPAIR    . RETINAL DETACHMENT SURGERY Bilateral 2000  . RETINAL DETACHMENT SURGERY Bilateral   . TOTAL KNEE ARTHROPLASTY Left 04/29/2019  . TOTAL KNEE ARTHROPLASTY Left 04/29/2019   Procedure: LEFT TOTAL KNEE ARTHROPLASTY;  Surgeon: Mcarthur Rossetti, MD;  Location: [facility];  Service: Orthopedics;  Laterality: Left;  . TUBAL LIGATION    . VEIN SURGERY Left 04/2010   Past Medical  History:  Diagnosis Date  . Allergy   . Anal fissure   . Anemia    borderline  . Anxiety   . Arthritis   . Blood clot in vein    left leg  . Detached retina    BOTH SCLERA BUCKLE BOTH EYES  . DVT (deep venous thrombosis) (HCC)    left leg  . Endometriosis   . Family history of adverse reaction to anesthesia    DAUGHTER POST OP PONV  . GERD (gastroesophageal reflux disease)   . Glaucoma    RIGHT WORST THAN LEFT  . Heart murmur    "caused by anxiety"   . History of hemorrhoids   . History of kidney stones   . Hypertension   . IBS (irritable bowel syndrome)   . Perforated eardrum, right    SMALL HOLE  . PONV (postoperative nausea and vomiting)   . Pulmonary embolism ([place]) 6 YRS AGO   LMP 10/11/2010   Opioid Risk Score:   Fall Risk Score:  `1  Depression screen PHQ 2/9  Depression screen [facility] 2/9 01/29/2020 01/23/2020 06/09/2019 01/20/2019 07/22/2018 07/19/2018 01/01/2018  Decreased Interest 3 3 0 0 0 0 0  Down, Depressed, Hopeless 2 3 0 0 0 0 0  PHQ - 2 Score 5 6 0 0 0 0 0  Altered sleeping 2 3 - - - - -  Tired, decreased energy 2 3 - - - - -  Change in appetite 2 3 - - - - -  Feeling bad or failure about yourself  0 0 - - - - -  Trouble concentrating 1 1 - - - - -  Moving slowly or fidgety/restless 0 0 - - - - -  Suicidal thoughts 0 0 - - - - -  PHQ-9 Score 12 16 - - - - -  Difficult doing work/chores Very difficult - - - - - -  Some recent data might be hidden   Review of Systems  Constitutional: Negative.        Weight loss  HENT: Negative.   Eyes: Negative.   Respiratory: Positive for shortness of breath.   Cardiovascular: Negative.   Gastrointestinal: Positive for abdominal pain, constipation and nausea.  Endocrine: Negative.   Genitourinary: Negative.   Musculoskeletal: Positive for back pain and gait problem.       Spasms  Skin: Negative.   Allergic/Immunologic: Negative.   Neurological: Positive for tremors.  Hematological: Negative.     Psychiatric/Behavioral:       Anxiety  All other systems reviewed and are negative.      Objective:   Physical Exam Patient was seen through MyChart Video. She appeared bright and happy.     Assessment & Plan:  Sharon [name] is a 54 year old woman who presents for follow-up of:  1) Left knee pain post-surgery:  -She has a plan for repeat surgical procedure with Dr. Ninfa Linden that will hopefully provide relief.   -She has been icing regularly- commended her on this.  -She has been through physical therapy and performs her exercises diligently.  -I have refilled Robaxin as she is nervous about feeling too sleepy with the Tizanidine, and the Robaxin does help.   -Provided refill of Hydrocodone. Up to 5 pills per day as needed. PDMP reviewed and patient is not receiving this medication from any other providers.   -Discussed Sprint PNS system as an option of pain treatment via neuromodulation. She defers at this time.  -She states Dr. Ninfa Linden would prefer [place] to take over [place] prescription. UDS obtained and pain contract signed.   -Provided dietary counseling regarding opioid induced constipation.  -She would like rheum panel checked: ordered, reviewed results which were negative, and discussed with patient.  Bunion: Referred to podiatry for bunion and orthotic fitting. She will be receiving these in 2 weeks.   Irritable bowel syndrome: She does not eat much sugar or sweets. She drinks 1 coke per day. She cuts tea out. She drinks water, coffee.   Concentration: She feels that her concentration may have been impacted by her opioid. Discussed that we can wean her off the narcotics if she would like.   General: Her daughter will be giving birth to her first grandchild on January 3rd, she hopes to be pain-free enough to play with her.   Fever: MyChart Video visit performed because of her fever today. She plans to get COVID tested.   -All questions answered. RTC in 4 weeks.   -

## 2020-04-12 ENCOUNTER — Other Ambulatory Visit: Payer: PRIVATE HEALTH INSURANCE | Admitting: Orthotics

## 2020-04-12 ENCOUNTER — Telehealth: Payer: Self-pay

## 2020-04-12 NOTE — Telephone Encounter (Signed)
[facility] - Client TELEPHONE ADVICE RECORD [facility] Patient Name: MACRINA LEHNERT Gender: Female DOB: 05/02/1966 Age: 54 Y 33 M 10 D Return Phone Number: [phone] (Primary) Address[place] [address] Client [facility] - Client Client Site [facility] - Day Physician Waunita Schooner- MD Contact Type Call Who Is Calling Patient / Member / Family / Caregiver Call Type Triage / Clinical Relationship To Patient Self Return Phone Number [phone] (Primary) Chief Complaint BREATHING - shortness of breath or sounds breathless Reason for Call Symptomatic / Request for [place] has fever, sore throat, and breathing problems. She has a Engineer, building services. Translation No Nurse Assessment Nurse: Melina Modena, RN, Estill Bamberg Date/Time Eilene Ghazi Time): 04/12/2020 9:01:09 AM Confirm and document reason for call. If symptomatic, describe symptoms. ---Caller states she has ear pain, fever, and having trouble breathing. Does the patient have any new or worsening symptoms? ---Yes Will a triage be completed? ---Yes Related visit to physician within the last 2 weeks? ---No Does the PT have any chronic conditions? (i.e. diabetes, asthma, this includes High risk factors for pregnancy, etc.) ---Yes List chronic conditions. ---GERD Is this a behavioral health or substance abuse call? ---No Guidelines Guideline Title Affirmed Question Affirmed Notes Nurse Date/Time (Eastern Time) Sore Throat [1] Difficulty breathing AND [2] not severe [place], Cytogeneticist 04/12/2020 9:03:53 AM Disp. Time Eilene Ghazi Time) Disposition Final User 04/12/2020 8:59:45 AM Send to Urgent Queue Jaclyn Prime 04/12/2020 9:09:18 AM Go to ED Now (or PCP triage) Yes Melina Modena, RN, Shelly Coss Disagree/Comply Comply Caller Understands Yes PLEASE NOTE: All timestamps contained within this report are  represented as [place] Standard Time. CONFIDENTIALTY NOTICE: This fax transmission is intended only for the addressee. It contains information that is legally privileged, confidential or otherwise protected from use or disclosure. If you are not the intended recipient, you are strictly prohibited from reviewing, disclosing, copying using or disseminating any of this information or taking any action in reliance on or regarding this information. If you have received this fax in error, please notify us immediately by telephone so that we can arrange for its return to [place]. Phone: [phone], Toll-Free: [phone], Fax: [phone] Page: 2 of 2 Call Id: [id number] PreDisposition Call Doctor Care Advice Given Per Guideline GO TO ED NOW (OR PCP TRIAGE): CARE ADVICE given per Sore Throat (Adult) guideline. Comments User: Tristan Schroeder, RN Date/Time Eilene Ghazi Time): 04/12/2020 9:11:18 AM Caller states she has a sore throat, HA, ear ache User: Tristan Schroeder, RN Date/Time Eilene Ghazi Time): 04/12/2020 9:16:27 AM Caller mentioned Klonopin and a refill Referrals GO TO FACILITY UNDECIDED

## 2020-04-12 NOTE — Telephone Encounter (Signed)
I spoke with pt; pt is having SOB with cough; pts chest feels heavy; difficult to get air in at times. Pt also has hole in ear drum and wants that cked; pt is mostly concerned with why she has fever and wants her breathing fixed. Pt said since changed reflux med has had problems with breathing and heaviness in chest. Pt already has virtual appt with Dr Einar Pheasant on 04/13/20. I advised pt with SOB,. Chest heaviness, wanting ear checked and pt having S/T should go to UC for face to face eval. Pt said she did that 4 months ago and was not given any med and was told her heart was OK. Advised again with the symptoms listed above pt should go to UC for face to face eval. Pt said she understood what I am saying but thinks she will keep virtual appt on 04/13/20 to see if can get new reflux med. FYI to Dr Einar Pheasant who is PCP and out of office and Glenda Chroman FNP who is in office.

## 2020-04-12 NOTE — Telephone Encounter (Signed)
Noted  

## 2020-04-12 NOTE — Telephone Encounter (Signed)
Agree with consideration for UC but will see in virtual visit

## 2020-04-13 ENCOUNTER — Telehealth (INDEPENDENT_AMBULATORY_CARE_PROVIDER_SITE_OTHER): Payer: PRIVATE HEALTH INSURANCE | Admitting: Family Medicine

## 2020-04-13 ENCOUNTER — Other Ambulatory Visit: Payer: Self-pay

## 2020-04-13 VITALS — HR 64 | Temp 98.0°F | Wt 147.0 lb

## 2020-04-13 DIAGNOSIS — F411 Generalized anxiety disorder: Secondary | ICD-10-CM

## 2020-04-13 DIAGNOSIS — K219 Gastro-esophageal reflux disease without esophagitis: Secondary | ICD-10-CM | POA: Insufficient documentation

## 2020-04-13 DIAGNOSIS — Z79899 Other long term (current) drug therapy: Secondary | ICD-10-CM

## 2020-04-13 DIAGNOSIS — G4700 Insomnia, unspecified: Secondary | ICD-10-CM

## 2020-04-13 DIAGNOSIS — J069 Acute upper respiratory infection, unspecified: Secondary | ICD-10-CM

## 2020-04-13 MED ORDER — CLONAZEPAM 0.5 MG PO TABS: 0.5000 mg | ORAL_TABLET | Freq: Every day | ORAL | 5 refills | Status: DC

## 2020-04-13 MED ORDER — FAMOTIDINE 20 MG PO TABS: 20.0000 mg | ORAL_TABLET | Freq: Two times a day (BID) | ORAL | 1 refills | Status: DC

## 2020-04-13 NOTE — Assessment & Plan Note (Signed)
Controlled currently. Will continue to monitor

## 2020-04-13 NOTE — Assessment & Plan Note (Signed)
Cough which she feels is reflux. Discussed options of alternative PPI vs famotidine (had success with ranitidine in the past). She will switch to famotidine. If cough not improving she will call/mychart

## 2020-04-13 NOTE — Progress Notes (Signed)
I connected with Ulice Brilliant on 04/13/20 at  9:20 AM EDT by video and verified that I am speaking with the correct person using two identifiers.   I discussed the limitations, risks, security and privacy concerns of performing an evaluation and management service by video and the availability of in person appointments. I also discussed with the patient that there may be a patient responsible charge related to this service. The patient expressed understanding and agreed to proceed.  Patient location: Home Provider Location: [facility] Participants: Lesleigh Noe and Ulice Brilliant   Subjective:     Willine Schwalbe is a 54 y.o. female presenting for Medication Refill (klonopin), see phone note (tested neg. for covid on saturday ), Hot Flashes (at night), and skin is "sore"     HPI  #Sleep - taking clonazepam - has been out for 5 days and only getting 3 hours at night - has also not been taking her pain medications due to her breathing/cold symptoms  #Fever/cough - negative covid testing - head is sore - feels hot - hot flashes - skin is sore  - no longer has a fever - endorses SOB and cough - endorses bad acid reflux as well - she stopped the medication she was taking omperazole - was on Zantac which was taken off the shelf - had an upset stomach yesterday and some diarrhea - no urinary symptoms - hx of sepsis 2/2 to kidney stone - sore throat - heavy chest - coughing to get air  Review of Systems   Social History   Tobacco Use  Smoking Status Never Smoker  Smokeless Tobacco Never Used        Objective:   BP Readings from Last 3 Encounters:  03/03/20 110/75  02/03/20 102/60  01/29/20 110/78   Wt Readings from Last 3 Encounters:  04/13/20 147 lb (66.7 kg)  04/09/20 147 lb 6.4 oz (66.9 kg)  03/03/20 144 lb (65.3 kg)   Pulse 64   Temp 98 F (36.7 C) (Temporal)   Wt 147 lb (66.7 kg)   LMP 10/11/2010   BMI 23.02 kg/m    Physical Exam Constitutional:      Appearance: Normal appearance. She is not ill-appearing.  HENT:     Head: Normocephalic and atraumatic.     Right Ear: External ear normal.     Left Ear: External ear normal.  Eyes:     Conjunctiva/sclera: Conjunctivae normal.  Pulmonary:     Effort: Pulmonary effort is normal. No respiratory distress.     Comments: Coughing occasionally Neurological:     Mental Status: She is alert. Mental status is at baseline.  Psychiatric:        Mood and Affect: Mood normal.        Behavior: Behavior normal.        Thought Content: Thought content normal.        Judgment: Judgment normal.           Assessment & Plan:   Problem List Items Addressed This Visit      Digestive   Gastroesophageal reflux disease    Cough which she feels is reflux. Discussed options of alternative PPI vs famotidine (had success with ranitidine in the past). She will switch to famotidine. If cough not improving she will call/mychart      Relevant Medications   famotidine (PEPCID) 20 MG tablet     Other   GAD (generalized anxiety disorder) - Primary  Controlled currently. Will continue to monitor      Relevant Medications   clonazePAM (KLONOPIN) 0.5 MG tablet   Insomnia    Not sleeping well off medication. Restart Clonazepam with refill for 6 months. Suspect hot flashes may be some withdrawal symptoms      Relevant Medications   clonazePAM (KLONOPIN) 0.5 MG tablet   Chronic prescription benzodiazepine use    Suspect some of her symptoms today - skin sensation - may be related to withdrawal. Medicine works well to control her anxiety and insomnia. Will restart. If her current symptoms do not improve with restarting she will call back       Other Visit Diagnoses    Viral URI with cough         Pt had negative covid test, but with SOB, cough, and feeling hot in setting of stopping benzo and worsening reflux. Will change treatment as above but given fever  over the weekend if symptoms persisting will consider abx course for possible pneumonia vs getting labs/x-ray if symptoms persist  Return in about 6 months (around 10/12/2020) for insomnia.  Lesleigh Noe, MD

## 2020-04-13 NOTE — Patient Instructions (Signed)
Restart Clonazepam  Stop Omeprazole Start Famotidine twice daily   If you cough and shortness of breath is not improving in 1-2 days mychart and I can send in antibiotics  If you develop new symptoms - abdominal pain, diarrhea, vomiting, high fevers -- call   If feeling better with the medications restarting - great!  If you feel like the famotidine is working for the heartburn take for up to 6-8 weeks and then try stopping.

## 2020-04-13 NOTE — Assessment & Plan Note (Signed)
Suspect some of her symptoms today - skin sensation - may be related to withdrawal. Medicine works well to control her anxiety and insomnia. Will restart. If her current symptoms do not improve with restarting she will call back

## 2020-04-13 NOTE — Assessment & Plan Note (Signed)
Not sleeping well off medication. Restart Clonazepam with refill for 6 months. Suspect hot flashes may be some withdrawal symptoms

## 2020-04-15 ENCOUNTER — Encounter: Payer: Self-pay | Admitting: Family Medicine

## 2020-04-15 DIAGNOSIS — J40 Bronchitis, not specified as acute or chronic: Secondary | ICD-10-CM

## 2020-04-15 MED ORDER — AZITHROMYCIN 250 MG PO TABS: ORAL_TABLET | ORAL | 0 refills | Status: DC

## 2020-04-15 MED ORDER — DULOXETINE HCL 20 MG PO CPEP
20.0000 mg | ORAL_CAPSULE | Freq: Every day | ORAL | 3 refills | Status: DC
Start: 2020-04-15 — End: 2020-05-06

## 2020-04-15 NOTE — Addendum Note (Signed)
Addended by: Izora Ribas on: 04/15/2020 07:11 PM   Modules accepted: Orders

## 2020-04-22 ENCOUNTER — Other Ambulatory Visit: Payer: Self-pay

## 2020-04-22 ENCOUNTER — Other Ambulatory Visit: Payer: Self-pay | Admitting: Physical Medicine and Rehabilitation

## 2020-04-22 ENCOUNTER — Ambulatory Visit: Payer: PRIVATE HEALTH INSURANCE | Admitting: Orthotics

## 2020-04-22 DIAGNOSIS — M2012 Hallux valgus (acquired), left foot: Secondary | ICD-10-CM

## 2020-04-22 DIAGNOSIS — M2011 Hallux valgus (acquired), right foot: Secondary | ICD-10-CM

## 2020-04-22 MED ORDER — MELOXICAM 15 MG PO TABS
15.0000 mg | ORAL_TABLET | Freq: Every day | ORAL | 2 refills | Status: DC
Start: 2020-04-22 — End: 2020-07-01

## 2020-04-22 NOTE — Progress Notes (Signed)
Built up f/o lateral aspect forefoot LEFT

## 2020-04-23 ENCOUNTER — Ambulatory Visit
Admission: RE | Admit: 2020-04-23 | Discharge: 2020-04-23 | Disposition: A | Discharge status: Home / Self Care | Payer: PRIVATE HEALTH INSURANCE | Source: Ambulatory Visit | Attending: Obstetrics and Gynecology | Admitting: Obstetrics and Gynecology

## 2020-04-23 DIAGNOSIS — R234 Changes in skin texture: Secondary | ICD-10-CM

## 2020-05-05 ENCOUNTER — Other Ambulatory Visit: Payer: Self-pay | Admitting: Family Medicine

## 2020-05-05 DIAGNOSIS — K219 Gastro-esophageal reflux disease without esophagitis: Secondary | ICD-10-CM

## 2020-05-06 ENCOUNTER — Other Ambulatory Visit: Payer: Self-pay | Admitting: Physical Medicine and Rehabilitation

## 2020-05-06 MED ORDER — DULOXETINE HCL 20 MG PO CPEP
40.0000 mg | ORAL_CAPSULE | Freq: Every day | ORAL | 3 refills | Status: DC
Start: 2020-05-06 — End: 2020-05-31

## 2020-05-19 ENCOUNTER — Encounter
Payer: PRIVATE HEALTH INSURANCE | Attending: Physical Medicine and Rehabilitation | Admitting: Physical Medicine and Rehabilitation

## 2020-05-19 ENCOUNTER — Encounter: Payer: Self-pay | Admitting: Physical Medicine and Rehabilitation

## 2020-05-19 ENCOUNTER — Other Ambulatory Visit: Payer: Self-pay

## 2020-05-19 VITALS — BP 138/90 | HR 83 | Temp 99.3°F | Ht 67.0 in | Wt 142.0 lb

## 2020-05-19 DIAGNOSIS — M7551 Bursitis of right shoulder: Secondary | ICD-10-CM

## 2020-05-19 DIAGNOSIS — G8929 Other chronic pain: Secondary | ICD-10-CM | POA: Diagnosis present

## 2020-05-19 DIAGNOSIS — M25562 Pain in left knee: Secondary | ICD-10-CM | POA: Diagnosis present

## 2020-05-19 DIAGNOSIS — G4701 Insomnia due to medical condition: Secondary | ICD-10-CM | POA: Diagnosis not present

## 2020-05-19 MED ORDER — GABAPENTIN 100 MG PO CAPS: 100.0000 mg | ORAL_CAPSULE | Freq: Three times a day (TID) | ORAL | 1 refills | Status: DC

## 2020-05-19 MED ORDER — LIDOCAINE 5 % EX PTCH: 1.0000 | MEDICATED_PATCH | CUTANEOUS | 0 refills | Status: DC

## 2020-05-19 MED ORDER — METHOCARBAMOL 500 MG PO TABS: 500.0000 mg | ORAL_TABLET | Freq: Four times a day (QID) | ORAL | 1 refills | Status: DC | PRN

## 2020-05-19 NOTE — Progress Notes (Signed)
Subjective:    Patient ID: Sharon [name], female    DOB: 11-03-1965, 54 y.o.   MRN: [id number]  HPI  Sharon [name] is a 54 year old woman who presents to establish care for left knee pain following left knee replacement in 04/2019.   Since last visit she has stopped Norco as it caused respiratory distress. We tried Cymbalta 40mg  without benefit or side effects. She has tried voltaren gel, blue emu oil without benefit. She has tried CBD   Her daughter was hospitalized with COVID pneumonia and is 8 months pregnant.   She still has a fever of 99.3. She took an antibiotic for ear pain and this helped her symptoms. It also helped her breathing.   Current dose of Norco is working well for her. It causes her to have hot flashes but she would still like to continue with this dose.  Discussed Sprint PNS with her again and she still does not like the thought of this option.  She did feel that some of the scar tissue has broken.   Prior history: She has been trying to improve quadriceps musculature. She discusses some of the exercises she does.   She has been denied in her disability. She has talked to an attorney.  She continues to ambulate with a cane.   She has tried the compounding cream and that does not help much. She has been trying Hemp cream which is helping.  She is getting foot insoles from the podiatrist. They should be ready in a week or two.   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   Can ride the stationary bicycle and that does not hurt. She sues it every day. She uses leg weights. When   Scar tissue grew back quickly post-surgery. She could not get PT appointments for 3 weeks.   Her surgeon does not want her to break the hardware and he may have to do arthroscopic procedure to flush out the inflammation.   She has tried ice which stopped helping, nabumetone 750  mg BID, Norco 1-2 tablets three times per day PRN (she has 12 left). She uses diclofenac gel   She takes Miralax for constipation. She takes up to 2 times per day. She has a history of IBS.   [facility] in [place]- she was a Research scientist (physical sciences). They let her go because she did not return in 12 weeks.   She was doing pool therapy but chemicals in the pool were increased because of COVID.  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 Notes reviewed- recently had a right subacromial injection.   Current pain is 8/10.    Pain Inventory Average Pain 8 Pain Right Now 6 My pain is intermittent, sharp, burning and aching  In the last 24 hours, has pain interfered with the following? General activity 10 Relation with others 10 Enjoyment of life 10 What TIME of day is your pain at its worst? daytime Sleep (in general) Fair  Pain is worse with: walking, bending, sitting and standing Pain improves with: rest, heat/ice, therapy/exercise and medication Relief from Meds: 8  Mobility walk with assistance use a cane how many minutes can you walk?  15 mins ability to climb steps?  no do you drive?  yes Do you have any goals in this area?  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  yes  Neuro/Psych tremor trouble walking spasms anxiety  Prior Studies Any changes since last visit?  yes  Physicians involved in your care New Patient   Family History  Problem Relation Age of Onset  . Emphysema Mother        smoker  . Allergies Mother   . Asthma Mother   . Heart disease Mother        AMI as cause of death  . COPD Mother   . Asthma Father   . Heart disease Father        AMI as cause of death  . Diabetes Father   . Asthma Brother   . Heart disease Brother 55       heart failure  . Lung cancer  Maternal Grandfather        was a smoker  . Cancer Maternal Grandfather        lung  . Heart disease Paternal Grandmother   . Heart disease Paternal Grandfather   . Colon cancer Neg Hx    Social History   Socioeconomic History  . Marital status: Soil scientist    Spouse name: Not on file  . Number of children: 1  . Years of education: Not on file  . Highest education level: Not on file  Occupational History  . Occupation: Restaurant manager, fast food: [employer]  Tobacco Use  . Smoking status: Never Smoker  . Smokeless tobacco: Never Used  Vaping Use  . Vaping Use: Never used  Substance and Sexual Activity  . Alcohol use: Not Currently    Comment: occ  . Drug use: No    Comment: former maryjuana use- quit in 1995  . Sexual activity: Yes    Birth control/protection: Surgical  Other Topics Concern  . Not on file  Social History Narrative   Marital status: divorced x 2; dating seriously x 3 years.        Children:  1 daughter (19); no grandchildren      Lives:  With boyfriend.        Employment: works at [employer]: none      Alcohol: rarely; socially      Exercise: none; has treadmill and elliptical and bikes   Coffee in am / 1/2 of coke in afternoon    High school education      01/29/20   From: the area   Living: alone - but boyfriend living with her Sharon [name] (2020) but knew each other in high school   Work: on disability - working on appeal      Family: one daughter Sharon [name] - one grandchild coming in January       Enjoys: relax and watch tv, use to like fishing/boating/rafting, reading, crochet       Exercise: rehab exercises   Diet: not great - limited intake      Safety   Seat belts: Yes    Guns: Yes  and secure   Safe in relationships: Yes    Social Determinants of Health   Financial Resource Strain:   . Difficulty of Paying Living Expenses: Not on file  Food Insecurity:   . Worried About Charity fundraiser in the  Last Year: Not on file  . Ran Out of  Food in the Last Year: Not on file  Transportation Needs:   . Lack of Transportation (Medical): Not on file  . Lack of Transportation (Non-Medical): Not on file  Physical Activity:   . Days of Exercise per Week: Not on file  . Minutes of Exercise per Session: Not on file  Stress:   . Feeling of Stress : Not on file  Social Connections:   . Frequency of Communication with Friends and Family: Not on file  . Frequency of Social Gatherings with Friends and Family: Not on file  . Attends Religious Services: Not on file  . Active Member of Clubs or Organizations: Not on file  . Attends Archivist Meetings: Not on file  . Marital Status: Not on file   Past Surgical History:  Procedure Laterality Date  . ABDOMINAL HYSTERECTOMY    . CATARACT EXTRACTION Bilateral 2015  . COLONOSCOPY    . CYSTOSCOPY W/ URETERAL STENT PLACEMENT Right 05/23/2017   Procedure: CYSTOSCOPY WITH RETROGRADE PYELOGRAM/URETERAL RIGHT STENT PLACEMENT;  Surgeon: Bjorn Loser, MD;  Location: [facility];  Service: Urology;  Laterality: Right;  . CYSTOSCOPY W/ URETERAL STENT PLACEMENT Right 06/04/2017   Procedure: CYSTOSCOPY WITHRIGHT RETROGRADE PYELOGRAMRIGHT /URETEREOSCOPY  STENT PLACEMENT;  Surgeon: Lucas Mallow, MD;  Location: [facility];  Service: Urology;  Laterality: Right;  . ENDOMETRIAL ABLATION    . EYE SURGERY Bilateral    cataract surgery with lens implants  . [place]  2011  . INCONTINENCE SURGERY    . INNER EAR SURGERY     X 2  . KNEE SURGERY    . RECTAL PROLAPSE REPAIR    . RETINAL DETACHMENT SURGERY Bilateral 2000  . RETINAL DETACHMENT SURGERY Bilateral   . TOTAL KNEE ARTHROPLASTY Left 04/29/2019  . TOTAL KNEE ARTHROPLASTY Left 04/29/2019   Procedure: LEFT TOTAL KNEE ARTHROPLASTY;  Surgeon: Mcarthur Rossetti, MD;  Location: [facility];  Service: Orthopedics;  Laterality: Left;  . TUBAL LIGATION    . VEIN SURGERY Left  04/2010   Past Medical History:  Diagnosis Date  . Allergy   . Anal fissure   . Anemia    borderline  . Anxiety   . Arthritis   . Blood clot in vein    left leg  . Detached retina    BOTH SCLERA BUCKLE BOTH EYES  . DVT (deep venous thrombosis) (HCC)    left leg  . Endometriosis   . Family history of adverse reaction to anesthesia    DAUGHTER POST OP PONV  . GERD (gastroesophageal reflux disease)   . Glaucoma    RIGHT WORST THAN LEFT  . Heart murmur    "caused by anxiety"   . History of hemorrhoids   . History of kidney stones   . Hypertension   . IBS (irritable bowel syndrome)   . Perforated eardrum, right    SMALL HOLE  . PONV (postoperative nausea and vomiting)   . Pulmonary embolism (HCC) 6 YRS AGO   BP 138/90   Pulse 83   Temp 99.3 F (37.4 C)   Ht 5\' 7"  (1.702 m)   Wt 142 lb (64.4 kg)   LMP 10/11/2010   SpO2 99%   BMI 22.24 kg/m   Opioid Risk Score:   Fall Risk Score:  `1  Depression screen PHQ 2/9  Depression screen [facility] 2/9 04/09/2020 01/29/2020 01/23/2020 06/09/2019 01/20/2019 07/22/2018 07/19/2018  Decreased Interest 0 3 3 0 0 0 0  Down, Depressed, Hopeless 0  2 3 0 0 0 0  PHQ - 2 Score 0 5 6 0 0 0 0  Altered sleeping - 2 3 - - - -  Tired, decreased energy - 2 3 - - - -  Change in appetite - 2 3 - - - -  Feeling bad or failure about yourself  - 0 0 - - - -  Trouble concentrating - 1 1 - - - -  Moving slowly or fidgety/restless - 0 0 - - - -  Suicidal thoughts - 0 0 - - - -  PHQ-9 Score - 12 16 - - - -  Difficult doing work/chores - Very difficult - - - - -  Some recent data might be hidden   Review of Systems  Constitutional: Negative.        Weight loss  HENT: Negative.   Eyes: Negative.   Respiratory: Positive for shortness of breath.   Cardiovascular: Negative.   Gastrointestinal: Positive for abdominal pain, constipation and nausea.  Endocrine: Negative.   Genitourinary: Negative.   Musculoskeletal: Positive for back pain and gait problem.        Spasms  Skin: Negative.   Allergic/Immunologic: Negative.   Neurological: Positive for tremors.  Hematological: Negative.   Psychiatric/Behavioral:       Anxiety  All other systems reviewed and are negative.      Objective:   Physical Exam Gen: no distress, normal appearing HEENT: oral mucosa pink and moist, NCAT Cardio: Reg rate Chest: normal effort, normal rate of breathing Abd: soft, non-distended Ext: no edema Skin: intact Musculoskeletal: Atrophy of left thigh, swelling and bruising on left knee.  Psych: pleasant, normal affect     Assessment & Plan:  Sharon [name] is a 54 year old woman who presents for follow-up of:  1) Left knee pain post-surgery:  -She has a plan for repeat surgical procedure with Dr. Ninfa Linden that will hopefully provide relief.   -She has been icing regularly- commended her on this.  -She has been through physical therapy and performs her exercises diligently.  -I have refilled Robaxin as she is nervous about feeling too sleepy with the Tizanidine, and the Robaxin does help.   -She has tried Celebrex and Tramadol without benefit.   -She is taking the Meloxicam every day.   -Prescribed Robaxin and Lidocaine patches today.   -Discussed Sprint PNS system as an option of pain treatment via neuromodulation. She defers at this time.  -She states Dr. Ninfa Linden would prefer [place] to take over [place] prescription. UDS obtained and pain contract signed.   -Provided dietary counseling regarding opioid induced constipation.  -She would like rheum panel checked: ordered, reviewed results which were negative, and discussed with patient.  -Gabapentin 100mg  TID prescribed.   Bunion: Referred to podiatry for bunion and orthotic fitting. She will be receiving these in 2 weeks.   Irritable bowel syndrome: She does not eat much sugar or sweets. She drinks 1 coke per day. She cuts tea out. She drinks water, coffee.   Concentration: She feels that her  concentration may have been impacted by her opioid. Discussed that we can wean her off the narcotics if she would like.   General: Her daughter will be giving birth to her first grandchild on January 3rd, she hopes to be pain-free enough to play with her.   Fever, dysuria: UA/UC ordered today.

## 2020-05-20 LAB — URINALYSIS, ROUTINE W REFLEX MICROSCOPIC
Bilirubin, UA: NEGATIVE
Glucose, UA: NEGATIVE
Ketones, UA: NEGATIVE
Leukocytes,UA: NEGATIVE
Nitrite, UA: NEGATIVE
Protein,UA: NEGATIVE
RBC, UA: NEGATIVE
Specific Gravity, UA: 1.02 (ref 1.005–1.030)
Urobilinogen, Ur: 0.2 mg/dL (ref 0.2–1.0)
pH, UA: 5.5 (ref 5.0–7.5)

## 2020-05-21 LAB — URINE CULTURE

## 2020-05-24 NOTE — Progress Notes (Signed)
NEUROLOGY FOLLOW UP OFFICE NOTE  Sharon [name] [id number]   Subjective:  Sharon [name] is a 54 year old female with generalized anxiety disorder, restless leg syndrome, hypertension, glaucoma, arthritis, chronic pain and history of PE and DVT in left leg who follows up for essential tremor  UPDATE: Medications:  Primidone 100mg  in AM and 50mg  at bedtime); gabapentin 100mg  TID, Mobic, clonazepam 0.5mg  QHS, Bystolic, Robaxin, estradiol  Tremors are overall stable but notes some difficulty with writing and may drop objects.  They are off and on.  She had left knee replacement last year that did not completely heal.  Still has pain. Also has right sciatica.      HISTORY: She started having tremors around 2015. It involves both hands. It interferes with daily tasks. She drops a lot. She has occasional trouble with utensils or writing. Nothing particular makes it worse. Steadying her wrist on the arm rest will suppress it.   Her mother and grandmother had tremor.  She has chronic generalized pain as well as neck pain. She reports a creepy crawly feeling under her skin, including back and down the legs. She has been diagnosed with restless leg syndrome. MRI of cervical spine from 04/12/18 was personally reviewed and showed cervical spondylosis, degenerative disc disease and congenitally short pedicles, causing mild impingement at C3-4 on left, C6-7 on right and C7-T1 on left. CT of right shoulder on 04/11/18 showed severe supraspinatus and infraspinatus tendinopathy without tear as well as small type 2 SLAP tear. She notes a 'pinching" pain radiating down the lateral arm and into the fingers with associated numbness and tingling in the fingers.MRI lumbar spine from 04/13/18 showed L4-L5 right foraminal disc protrusion that may be causing right L4 nerve root irritation. L5-S1 moderate face arthrosis and small central protrusion with annular fissure.  She underwent lab work  on 03/04/2019 to evaluate chronic pain.  ANA was negative, RF negative, sed rate 5, CRP <0.1, CK 49  Past medications:  Cymbalta (worsened tremor)  PAST MEDICAL HISTORY: Past Medical History:  Diagnosis Date  . Allergy   . Anal fissure   . Anemia    borderline  . Anxiety   . Arthritis   . Blood clot in vein    left leg  . Detached retina    BOTH SCLERA BUCKLE BOTH EYES  . DVT (deep venous thrombosis) (HCC)    left leg  . Endometriosis   . Family history of adverse reaction to anesthesia    DAUGHTER POST OP PONV  . GERD (gastroesophageal reflux disease)   . Glaucoma    RIGHT WORST THAN LEFT  . Heart murmur    "caused by anxiety"   . History of hemorrhoids   . History of kidney stones   . Hypertension   . IBS (irritable bowel syndrome)   . Perforated eardrum, right    SMALL HOLE  . PONV (postoperative nausea and vomiting)   . Pulmonary embolism (HCC) 6 YRS AGO    MEDICATIONS: Current Outpatient Medications on File Prior to Visit  Medication Sig Dispense Refill  . acetaminophen (TYLENOL) 500 MG tablet Take 1,000 mg by mouth every 8 (eight) hours as needed for moderate pain.     Marland Kitchen acyclovir (ZOVIRAX) 400 MG tablet Take 1 tablet (400 mg total) by mouth at bedtime. 90 tablet 3  . azithromycin (ZITHROMAX) 250 MG tablet Take 2 tablets (500 mg) today. Then take 1 tablet daily for the next 4 days. 6 tablet 0  .  Cholecalciferol (VITAMIN D) 2000 UNITS tablet Take 2,000 Units by mouth daily.     . clonazePAM (KLONOPIN) 0.5 MG tablet Take 1 tablet (0.5 mg total) by mouth at bedtime. 30 tablet 5  . DULoxetine (CYMBALTA) 20 MG capsule Take 2 capsules (40 mg total) by mouth daily. 60 capsule 3  . EPINEPHrine 0.3 mg/0.3 mL IJ SOAJ injection Inject 0.3 mLs (0.3 mg total) into the muscle once as needed (For anaphylaxis.). (Patient taking differently: Inject 0.3 mg into the muscle once as needed for anaphylaxis. ) 1 each 1  . estradiol (ESTRACE) 0.1 MG/GM vaginal cream Insert pea-sized  amount nightly for 2 weeks then every other night    . famotidine (PEPCID) 20 MG tablet Take 1 tablet (20 mg total) by mouth 2 (two) times daily. 60 tablet 1  . gabapentin (NEURONTIN) 100 MG capsule Take 1 capsule (100 mg total) by mouth 3 (three) times daily. 90 capsule 1  . lidocaine (LIDODERM) 5 % Place 1 patch onto the skin daily. Remove & Discard patch within 12 hours or as directed by MD 30 patch 0  . meloxicam (MOBIC) 15 MG tablet Take 1 tablet (15 mg total) by mouth daily. 30 tablet 2  . methocarbamol (ROBAXIN) 500 MG tablet Take 1 tablet (500 mg total) by mouth every 6 (six) hours as needed for muscle spasms. 60 tablet 1  . Multiple Vitamin (MULTI-VITAMINS) TABS Take 1 tablet by mouth daily.     . nebivolol (BYSTOLIC) 5 MG tablet Take 1 tablet (5 mg total) by mouth daily. 90 tablet 3  . Nitroglycerin (RECTIV) 0.4 % OINT Place 0.4 inches rectally 2 (two) times daily as needed (For anal fissures.). 30 g 1  . ondansetron (ZOFRAN ODT) 4 MG disintegrating tablet Take 1 tablet (4 mg total) by mouth every 8 (eight) hours as needed for nausea or vomiting. 20 tablet 1  . ondansetron (ZOFRAN) 8 MG tablet Take 1 tablet (8 mg total) by mouth every 8 (eight) hours as needed for nausea or vomiting. 20 tablet 1  . Polyethylene Glycol 3350 (MIRALAX PO) Take 17 g by mouth daily as needed (constipation).     . primidone (MYSOLINE) 50 MG tablet TAKE 3 TABLETS BY MOUTH EVERY DAY 270 tablet 1  . Travoprost, BAK Free, (TRAVATAN) 0.004 % SOLN ophthalmic solution INSTILL 1 DROP INTO BOTH EYES AT BEDTIME 5 mL 3  . vitamin B-12 (CYANOCOBALAMIN) 1000 MCG tablet Take 1 tablet (1,000 mcg total) by mouth daily. 30 tablet 3  . zinc gluconate 50 MG tablet Take 50 mg by mouth daily.     No current facility-administered medications on file prior to visit.    ALLERGIES: Allergies  Allergen Reactions  . Bee Venom Anaphylaxis  . Penicillins Other (See Comments)    Reaction unknown from childhood Has patient had a PCN  reaction causing immediate rash, facial/tongue/throat swelling, SOB or lightheadedness with hypotension: unknown Has patient had a PCN reaction causing severe rash involving mucus membranes or skin necrosis: unknown  Has patient had a PCN reaction that required hospitalization: no Has patient had a PCN reaction occurring within the last 10 years: no If all of the above answers are "NO", then may proceed with Cephalosporin use.   Marland Kitchen Dextromethorphan Polistirex Er Other (See Comments)    Pt states caused "a lump" in her throat, making it difficult to swallow  . Diflucan [Fluconazole]     Primidone interaction  . Hydrocodone Nausea And Vomiting  . Oxycodone-Acetaminophen Other (See Comments)  Made fingers tingle and go numb, started "feeling weird".   . Tape Itching and Other (See Comments)    Bandaids - leaves red marks    FAMILY HISTORY: Family History  Problem Relation Age of Onset  . Emphysema Mother        smoker  . Allergies Mother   . Asthma Mother   . Heart disease Mother        AMI as cause of death  . COPD Mother   . Asthma Father   . Heart disease Father        AMI as cause of death  . Diabetes Father   . Asthma Brother   . Heart disease Brother 78       heart failure  . Lung cancer Maternal Grandfather        was a smoker  . Cancer Maternal Grandfather        lung  . Heart disease Paternal Grandmother   . Heart disease Paternal Grandfather   . Colon cancer Neg Hx     SOCIAL HISTORY: Social History   Socioeconomic History  . Marital status: Soil scientist    Spouse name: Not on file  . Number of children: 1  . Years of education: Not on file  . Highest education level: Not on file  Occupational History  . Occupation: Restaurant manager, fast food: [employer]  Tobacco Use  . Smoking status: Never Smoker  . Smokeless tobacco: Never Used  Vaping Use  . Vaping Use: Never used  Substance and Sexual Activity  . Alcohol use: Not  Currently    Comment: occ  . Drug use: No    Comment: former maryjuana use- quit in 1995  . Sexual activity: Yes    Birth control/protection: Surgical  Other Topics Concern  . Not on file  Social History Narrative   Marital status: divorced x 2; dating seriously x 3 years.        Children:  1 daughter (76); no grandchildren      Lives:  With boyfriend.        Employment: works at [employer]: none      Alcohol: rarely; socially      Exercise: none; has treadmill and elliptical and bikes   Coffee in am / 1/2 of coke in afternoon    High school education      01/29/20   From: the area   Living: alone - but boyfriend living with her Wilfred Lacy (2020) but knew each other in high school   Work: on disability - working on appeal      Family: one daughter [name] - one grandchild coming in January       Enjoys: relax and watch tv, use to like fishing/boating/rafting, reading, crochet       Exercise: rehab exercises   Diet: not great - limited intake      Safety   Seat belts: Yes    Guns: Yes  and secure   Safe in relationships: Yes    Social Determinants of Health   Financial Resource Strain:   . Difficulty of Paying Living Expenses: Not on file  Food Insecurity:   . Worried About Charity fundraiser in the Last Year: Not on file  . Ran Out of Food in the Last Year: Not on file  Transportation Needs:   . Lack of Transportation (Medical): Not on file  . Lack of  Transportation (Non-Medical): Not on file  Physical Activity:   . Days of Exercise per Week: Not on file  . Minutes of Exercise per Session: Not on file  Stress:   . Feeling of Stress : Not on file  Social Connections:   . Frequency of Communication with Friends and Family: Not on file  . Frequency of Social Gatherings with Friends and Family: Not on file  . Attends Religious Services: Not on file  . Active Member of Clubs or Organizations: Not on file  . Attends Archivist Meetings: Not on  file  . Marital Status: Not on file  Intimate Partner Violence:   . Fear of Current or Ex-Partner: Not on file  . Emotionally Abused: Not on file  . Physically Abused: Not on file  . Sexually Abused: Not on file     Objective:  Blood pressure 131/83, pulse 78, resp. rate 20, height 5\' 7"  (1.702 m), weight 145 lb (65.8 kg), last menstrual period 10/11/2010, SpO2 95 %. General: No acute distress.  Patient appears well-groomed.   Head:  Normocephalic/atraumatic Eyes:  Fundi examined but not visualized Neck: supple, no paraspinal tenderness, full range of motion Heart:  Regular rate and rhythm Lungs:  Clear to auscultation bilaterally Back: No paraspinal tenderness Neurological Exam: alert and oriented to person, place, and time. Attention span and concentration intact, recent and remote memory intact, fund of knowledge intact.  Speech fluent and not dysarthric, language intact.  CN II-XII intact. Bulk and tone normal, muscle strength 5/5 throughout.  Sensation to light touch, temperature and vibration intact.  Deep tendon reflexes 2+ throughout, toes downgoing.  Finger to nose and heel to shin testing intact.  Gait normal, Romberg negative.   Assessment/Plan:   Essential tremor  1.  Will increase primidone to 50mg  in AM and 150mg  at night. 2.  Follow up 6 months.  Metta Clines, DO  CC:  Waunita Schooner, MD

## 2020-05-25 ENCOUNTER — Encounter: Payer: Self-pay | Admitting: Neurology

## 2020-05-25 ENCOUNTER — Ambulatory Visit (INDEPENDENT_AMBULATORY_CARE_PROVIDER_SITE_OTHER): Payer: PRIVATE HEALTH INSURANCE | Admitting: Neurology

## 2020-05-25 ENCOUNTER — Other Ambulatory Visit: Payer: Self-pay

## 2020-05-25 VITALS — BP 131/83 | HR 78 | Resp 20 | Ht 67.0 in | Wt 145.0 lb

## 2020-05-25 DIAGNOSIS — G250 Essential tremor: Secondary | ICD-10-CM | POA: Diagnosis not present

## 2020-05-25 MED ORDER — PRIMIDONE 50 MG PO TABS: ORAL_TABLET | ORAL | 5 refills | Status: DC

## 2020-05-25 NOTE — Patient Instructions (Signed)
1.  Will increase primidone to 50mg  in morning and 150mg  at bedtime 2.  Follow up 6 months.

## 2020-05-30 ENCOUNTER — Other Ambulatory Visit: Payer: Self-pay | Admitting: Physical Medicine and Rehabilitation

## 2020-05-31 ENCOUNTER — Other Ambulatory Visit: Payer: Self-pay | Admitting: Physical Medicine and Rehabilitation

## 2020-05-31 MED ORDER — GABAPENTIN 100 MG PO CAPS
200.0000 mg | ORAL_CAPSULE | Freq: Three times a day (TID) | ORAL | 1 refills | Status: DC
Start: 2020-05-31 — End: 2020-07-01

## 2020-06-07 ENCOUNTER — Encounter: Payer: Self-pay | Admitting: Family Medicine

## 2020-06-07 ENCOUNTER — Ambulatory Visit (INDEPENDENT_AMBULATORY_CARE_PROVIDER_SITE_OTHER): Payer: PRIVATE HEALTH INSURANCE | Admitting: Family Medicine

## 2020-06-07 ENCOUNTER — Ambulatory Visit (INDEPENDENT_AMBULATORY_CARE_PROVIDER_SITE_OTHER)
Admission: RE | Admit: 2020-06-07 | Discharge: 2020-06-07 | Disposition: A | Discharge status: Home / Self Care | Payer: PRIVATE HEALTH INSURANCE | Source: Ambulatory Visit | Attending: Family Medicine | Admitting: Family Medicine

## 2020-06-07 ENCOUNTER — Other Ambulatory Visit: Payer: Self-pay

## 2020-06-07 VITALS — BP 140/80 | HR 83 | Temp 98.1°F | Wt 150.8 lb

## 2020-06-07 DIAGNOSIS — R053 Chronic cough: Secondary | ICD-10-CM

## 2020-06-07 DIAGNOSIS — R509 Fever, unspecified: Secondary | ICD-10-CM | POA: Diagnosis not present

## 2020-06-07 DIAGNOSIS — R0789 Other chest pain: Secondary | ICD-10-CM

## 2020-06-07 DIAGNOSIS — L609 Nail disorder, unspecified: Secondary | ICD-10-CM

## 2020-06-07 MED ORDER — ALBUTEROL SULFATE HFA 108 (90 BASE) MCG/ACT IN AERS: 2.0000 | INHALATION_SPRAY | Freq: Four times a day (QID) | RESPIRATORY_TRACT | 2 refills | Status: DC | PRN

## 2020-06-07 NOTE — Assessment & Plan Note (Signed)
Great toe on left foot with thickened yellowing at the top with normal new growth at the base of the nail. Unclear why. Advised not getting pedicures and podiatry follow-up - notes they did not address at last appointment as she was getting orthotics and being assess for foot pain. 3rd digit with some yellowing but no sign of infection.

## 2020-06-07 NOTE — Patient Instructions (Signed)
Cough/Chest tightness - Chest X-ray today - trial of albuterol inhaler  Low grade fever - labs to assess possible cause

## 2020-06-07 NOTE — Progress Notes (Signed)
Subjective:     Sharon [name] is a 54 y.o. female presenting for Follow-up (anxiety)     HPI   #GERD - famotidine is working well  #daily fever - was seen back in October with cold, cough, clonazepam - was having 99.3 temperatures on and off since Oct 2021 - this was also in the setting of stopping benzo - since restarted - took antibiotic back in October w/ improvement - continues to have heaviness in the chest  - concerned about risk of asthma - due to 2nd hand smoke exposure - several family members - endorses chronic knee pain - treating with lidocaine surgery - left knee pain with hx of replacement  Review of Systems  04/13/2020: Clinic - GAD - stable. GERD - switch to famotidine. Insomnia - restart clonazepam prn  Social History   Tobacco Use  Smoking Status Never Smoker  Smokeless Tobacco Never Used        Objective:    BP Readings from Last 3 Encounters:  06/07/20 140/80  05/25/20 131/83  05/19/20 138/90   Wt Readings from Last 3 Encounters:  06/07/20 150 lb 12 oz (68.4 kg)  05/25/20 145 lb (65.8 kg)  05/19/20 142 lb (64.4 kg)    BP 140/80   Pulse 83   Temp 98.1 F (36.7 C) (Temporal)   Wt 150 lb 12 oz (68.4 kg)   LMP 10/11/2010   SpO2 99%   BMI 23.61 kg/m    Physical Exam Constitutional:      General: She is not in acute distress.    Appearance: She is well-developed. She is not diaphoretic.  HENT:     Right Ear: External ear normal. No drainage or tenderness. Tympanic membrane is perforated.     Left Ear: Tympanic membrane and external ear normal.  Eyes:     Conjunctiva/sclera: Conjunctivae normal.  Cardiovascular:     Rate and Rhythm: Normal rate and regular rhythm.     Heart sounds: No murmur heard.   Pulmonary:     Effort: Pulmonary effort is normal. No respiratory distress.     Breath sounds: Normal breath sounds. No wheezing.  Musculoskeletal:     Cervical back: Neck supple.  Lymphadenopathy:     Cervical: No  cervical adenopathy.  Skin:    General: Skin is warm and dry.     Capillary Refill: Capillary refill takes less than 2 seconds.  Neurological:     Mental Status: She is alert. Mental status is at baseline.  Psychiatric:        Mood and Affect: Mood normal.        Behavior: Behavior normal.           Assessment & Plan:   Problem List Items Addressed This Visit      Musculoskeletal and Integument   Nail abnormality    Great toe on left foot with thickened yellowing at the top with normal new growth at the base of the nail. Unclear why. Advised not getting pedicures and podiatry follow-up - notes they did not address at last appointment as she was getting orthotics and being assess for foot pain. 3rd digit with some yellowing but no sign of infection.         Other   Persistent cough for 3 weeks or longer - Primary    Pt notes cough for >6 weeks. Will get CXR and labs. Family hx of asthma so trial of albuterol to see if this improves symptoms.  Relevant Medications   albuterol (VENTOLIN HFA) 108 (90 Base) MCG/ACT inhaler   Other Relevant Orders   DG Chest 2 View   Persistent fever    Pt notes hx of 99.3 temperature. Discussed that this is not technically a fever, but given night sweats and persistence of cough and other symptoms reasonable to get blood work to assess for possible cause.       Relevant Orders   Comprehensive metabolic panel   CBC with Differential   Sedimentation rate   High sensitivity CRP    Other Visit Diagnoses    Chest tightness       Relevant Medications   albuterol (VENTOLIN HFA) 108 (90 Base) MCG/ACT inhaler       Return in about 4 months (around 10/06/2020).  Lesleigh Noe, MD  This visit occurred during the SARS-CoV-2 public health emergency.  Safety protocols were in place, including screening questions prior to the visit, additional usage of staff PPE, and extensive cleaning of exam room while observing appropriate contact time as  indicated for disinfecting solutions.

## 2020-06-07 NOTE — Assessment & Plan Note (Signed)
Pt notes hx of 99.3 temperature. Discussed that this is not technically a fever, but given night sweats and persistence of cough and other symptoms reasonable to get blood work to assess for possible cause.

## 2020-06-07 NOTE — Assessment & Plan Note (Signed)
Pt notes cough for >6 weeks. Will get CXR and labs. Family hx of asthma so trial of albuterol to see if this improves symptoms.

## 2020-06-08 LAB — CBC WITH DIFFERENTIAL/PLATELET
Basophils Absolute: 0.1 10*3/uL (ref 0.0–0.1)
Basophils Relative: 0.9 % (ref 0.0–3.0)
Eosinophils Absolute: 0.1 10*3/uL (ref 0.0–0.7)
Eosinophils Relative: 1.5 % (ref 0.0–5.0)
HCT: 37.4 % (ref 36.0–46.0)
Hemoglobin: 13 g/dL (ref 12.0–15.0)
Lymphocytes Relative: 21.6 % (ref 12.0–46.0)
Lymphs Abs: 1.6 10*3/uL (ref 0.7–4.0)
MCHC: 34.8 g/dL (ref 30.0–36.0)
MCV: 89.7 fl (ref 78.0–100.0)
Monocytes Absolute: 0.5 10*3/uL (ref 0.1–1.0)
Monocytes Relative: 6.7 % (ref 3.0–12.0)
Neutro Abs: 5.1 10*3/uL (ref 1.4–7.7)
Neutrophils Relative %: 69.3 % (ref 43.0–77.0)
Platelets: 377 10*3/uL (ref 150.0–400.0)
RBC: 4.17 Mil/uL (ref 3.87–5.11)
RDW: 12.8 % (ref 11.5–15.5)
WBC: 7.4 10*3/uL (ref 4.0–10.5)

## 2020-06-08 LAB — COMPREHENSIVE METABOLIC PANEL
ALT: 17 U/L (ref 0–35)
AST: 15 U/L (ref 0–37)
Albumin: 4.6 g/dL (ref 3.5–5.2)
Alkaline Phosphatase: 70 U/L (ref 39–117)
BUN: 27 mg/dL — ABNORMAL HIGH (ref 6–23)
CO2: 31 mEq/L (ref 19–32)
Calcium: 9.7 mg/dL (ref 8.4–10.5)
Chloride: 104 mEq/L (ref 96–112)
Creatinine, Ser: 0.74 mg/dL (ref 0.40–1.20)
GFR: 90.86 mL/min (ref >60.00)
Glucose, Bld: 78 mg/dL (ref 70–99)
Potassium: 4.4 mEq/L (ref 3.5–5.1)
Sodium: 141 mEq/L (ref 135–145)
Total Bilirubin: 0.3 mg/dL (ref 0.2–1.2)
Total Protein: 7.5 g/dL (ref 6.0–8.3)

## 2020-06-08 LAB — HIGH SENSITIVITY CRP: CRP, High Sensitivity: 0.59 mg/L (ref 0.000–5.000)

## 2020-06-08 LAB — SEDIMENTATION RATE: Sed Rate: 4 mm/hr (ref 0–30)

## 2020-06-22 ENCOUNTER — Other Ambulatory Visit: Payer: Self-pay | Admitting: Family Medicine

## 2020-06-22 DIAGNOSIS — K219 Gastro-esophageal reflux disease without esophagitis: Secondary | ICD-10-CM

## 2020-06-25 ENCOUNTER — Encounter: Payer: Self-pay | Admitting: Family Medicine

## 2020-06-25 ENCOUNTER — Telehealth (INDEPENDENT_AMBULATORY_CARE_PROVIDER_SITE_OTHER): Payer: PRIVATE HEALTH INSURANCE | Admitting: Family Medicine

## 2020-06-25 VITALS — Temp 98.0°F | Ht 67.0 in | Wt 144.4 lb

## 2020-06-25 DIAGNOSIS — H6691 Otitis media, unspecified, right ear: Secondary | ICD-10-CM | POA: Diagnosis not present

## 2020-06-25 MED ORDER — DOXYCYCLINE HYCLATE 100 MG PO TABS: 100.0000 mg | ORAL_TABLET | Freq: Two times a day (BID) | ORAL | 0 refills | Status: DC

## 2020-06-25 NOTE — Assessment & Plan Note (Signed)
Hex of hole in eardrum and multiple ear surgeries.   Symptoms correspond with possible right ear infeciton.   Last antibiotics azithromycin  2 months ago she states she neve fully got better, PCN allergy serious.  Will treat with 10 days of doxy given limitations.   Return precautions given.

## 2020-06-25 NOTE — Progress Notes (Signed)
VIRTUAL VISIT Due to national recommendations of social distancing due to [place] 19, a virtual visit is felt to be most appropriate for this patient at this time.   I connected with the patient on 06/25/20 at  9:20 AM EST by virtual telehealth platform and verified that I am speaking with the correct person using two identifiers.   I discussed the limitations, risks, security and privacy concerns of performing an evaluation and management service by  virtual telehealth platform and the availability of in person appointments. I also discussed with the patient that there may be a patient responsible charge related to this service. The patient expressed understanding and agreed to proceed.  Patient location: Home Provider Location: [facility] Participants: Eliezer Lofts and Ulice Brilliant   Chief Complaint  Patient presents with  . Otalgia    Right  . Sore Throat    Negative Covid Home Test yesterday  . Mouth Sores Under Tongue    History of Present Illness: 55 year old female patient of Dr. Verda Cumins with history of HTN  Presents with new onset right ear pain and sore throat. She has also noted sores under her tongue. She report she started with symptoms 6 days ago... right ear pain and pressure, fullness, sharp pain occ. Pain radiates to jaw. Moderate sore throat, no exudate. Dry cough, post nasal drip  No SOB.  No fever, no new myalgia.  Describes white canker sores under tounge.   She had a negative home COVID test yesterday.  COVID vaccine x 2, last in 07/2019   No known exposure to strep or COVID.   Hx of  left ear surgeries.. has hole in right ear drum.   Last antibiotics azithromycin  In 03/2020 for cold/cough  persistent cough 12/20 CXR  clear, wbc normal.   Only new med is albuterol .. only used twice  COVID 19 screen No recent travel or known exposure to [place] The patient denies respiratory symptoms of COVID 19 at this time.  The importance of social  distancing was discussed today.   Review of Systems  Constitutional: Negative for chills and fever.  HENT: Negative for congestion and ear pain.   Eyes: Negative for pain and redness.  Respiratory: Negative for cough and shortness of breath.   Cardiovascular: Negative for chest pain, palpitations and leg swelling.  Gastrointestinal: Negative for abdominal pain, blood in stool, constipation, diarrhea, nausea and vomiting.  Genitourinary: Negative for dysuria.  Musculoskeletal: Negative for falls and myalgias.  Skin: Negative for rash.  Neurological: Negative for dizziness.  Psychiatric/Behavioral: Negative for depression. The patient is not nervous/anxious.       Past Medical History:  Diagnosis Date  . Allergy   . Anal fissure   . Anemia    borderline  . Anxiety   . Arthritis   . Blood clot in vein    left leg  . Detached retina    BOTH SCLERA BUCKLE BOTH EYES  . DVT (deep venous thrombosis) (HCC)    left leg  . Endometriosis   . Family history of adverse reaction to anesthesia    DAUGHTER POST OP PONV  . GERD (gastroesophageal reflux disease)   . Glaucoma    RIGHT WORST THAN LEFT  . Heart murmur    "caused by anxiety"   . History of hemorrhoids   . History of kidney stones   . Hypertension   . IBS (irritable bowel syndrome)   . Perforated eardrum, right    SMALL  HOLE  . PONV (postoperative nausea and vomiting)   . Pulmonary embolism ([place]) 6 YRS AGO    reports that she has never smoked. She has never used smokeless tobacco. She reports previous alcohol use. She reports that she does not use drugs.   Current Outpatient Medications:  .  acetaminophen (TYLENOL) 500 MG tablet, Take 1,000 mg by mouth every 8 (eight) hours as needed for moderate pain. , Disp: , Rfl:  .  albuterol (VENTOLIN HFA) 108 (90 Base) MCG/ACT inhaler, Inhale 2 puffs into the lungs every 6 (six) hours as needed for wheezing or shortness of breath., Disp: 8 g, Rfl: 2 .  clonazePAM (KLONOPIN) 0.5 MG  tablet, Take 1 tablet (0.5 mg total) by mouth at bedtime., Disp: 30 tablet, Rfl: 5 .  EPINEPHrine 0.3 mg/0.3 mL IJ SOAJ injection, Inject 0.3 mLs (0.3 mg total) into the muscle once as needed (For anaphylaxis.)., Disp: 1 each, Rfl: 1 .  estradiol (ESTRACE) 0.1 MG/GM vaginal cream, Insert pea-sized amount nightly for 2 weeks then every other night, Disp: , Rfl:  .  famotidine (PEPCID) 20 MG tablet, TAKE 1 TABLET BY MOUTH TWICE A DAY, Disp: 60 tablet, Rfl: 1 .  Multiple Vitamin (MULTI-VITAMINS) TABS, Take 1 tablet by mouth daily. , Disp: , Rfl:  .  nebivolol (BYSTOLIC) 5 MG tablet, Take 1 tablet (5 mg total) by mouth daily., Disp: 90 tablet, Rfl: 3 .  Nitroglycerin (RECTIV) 0.4 % OINT, Place 0.4 inches rectally 2 (two) times daily as needed (For anal fissures.)., Disp: 30 g, Rfl: 1 .  ondansetron (ZOFRAN ODT) 4 MG disintegrating tablet, Take 1 tablet (4 mg total) by mouth every 8 (eight) hours as needed for nausea or vomiting., Disp: 20 tablet, Rfl: 1 .  ondansetron (ZOFRAN) 8 MG tablet, Take 1 tablet (8 mg total) by mouth every 8 (eight) hours as needed for nausea or vomiting., Disp: 20 tablet, Rfl: 1 .  Polyethylene Glycol 3350 (MIRALAX PO), Take 17 g by mouth daily as needed (constipation). , Disp: , Rfl:  .  primidone (MYSOLINE) 50 MG tablet, Take 1 tablet in morning and 3 tablets at bedtime, Disp: 120 tablet, Rfl: 5 .  Travoprost, BAK Free, (TRAVATAN) 0.004 % SOLN ophthalmic solution, INSTILL 1 DROP INTO BOTH EYES AT BEDTIME, Disp: 5 mL, Rfl: 3 .  vitamin B-12 (CYANOCOBALAMIN) 1000 MCG tablet, Take 1 tablet (1,000 mcg total) by mouth daily., Disp: 30 tablet, Rfl: 3 .  zinc gluconate 50 MG tablet, Take 50 mg by mouth daily., Disp: , Rfl:  .  acyclovir (ZOVIRAX) 400 MG tablet, Take 1 tablet (400 mg total) by mouth at bedtime. (Patient not taking: Reported on 06/25/2020), Disp: 90 tablet, Rfl: 3 .  Cholecalciferol (VITAMIN D) 2000 UNITS tablet, Take 2,000 Units by mouth daily.  (Patient not taking: No  sig reported), Disp: , Rfl:  .  gabapentin (NEURONTIN) 100 MG capsule, Take 2 capsules (200 mg total) by mouth 3 (three) times daily. (Patient not taking: Reported on 06/25/2020), Disp: 180 capsule, Rfl: 1 .  lidocaine (LIDODERM) 5 %, Place 1 patch onto the skin daily. Remove & Discard patch within 12 hours or as directed by MD (Patient not taking: Reported on 06/25/2020), Disp: 30 patch, Rfl: 0 .  meloxicam (MOBIC) 15 MG tablet, Take 1 tablet (15 mg total) by mouth daily. (Patient not taking: Reported on 06/25/2020), Disp: 30 tablet, Rfl: 2 .  methocarbamol (ROBAXIN) 500 MG tablet, Take 1 tablet (500 mg total) by mouth every 6 (six)  hours as needed for muscle spasms. (Patient not taking: Reported on 06/25/2020), Disp: 60 tablet, Rfl: 1   Observations/Objective: Temperature 98 F (36.7 C), temperature source Oral, height 5\' 7"  (1.702 m), weight 144 lb 6 oz (65.5 kg), last menstrual period 10/11/2010.  Physical Exam  Physical Exam Constitutional:      General: The patient is not in acute distress. Pulmonary:     Effort: Pulmonary effort is normal. No respiratory distress.  Neurological:     Mental Status: The patient is alert and oriented to person, place, and time.  Psychiatric:        Mood and Affect: Mood normal.        Behavior: Behavior normal.   Assessment and Plan Problem List Items Addressed This Visit    Right otitis media - Primary    Hex of hole in eardrum and multiple ear surgeries.   Symptoms correspond with possible right ear infeciton.   Last antibiotics azithromycin  2 months ago she states she neve fully got better, PCN allergy serious.  Will treat with 10 days of doxy given limitations.   Return precautions given.      Relevant Medications   doxycycline (VIBRA-TABS) 100 MG tablet        I discussed the assessment and treatment plan with the patient. The patient was provided an opportunity to ask questions and all were answered. The patient agreed with the plan and  demonstrated an understanding of the instructions.   The patient was advised to call back or seek an in-person evaluation if the symptoms worsen or if the condition fails to improve as anticipated.     Eliezer Lofts, MD

## 2020-07-01 ENCOUNTER — Encounter
Payer: PRIVATE HEALTH INSURANCE | Attending: Physical Medicine and Rehabilitation | Admitting: Physical Medicine and Rehabilitation

## 2020-07-01 ENCOUNTER — Encounter: Payer: Self-pay | Admitting: Physical Medicine and Rehabilitation

## 2020-07-01 ENCOUNTER — Other Ambulatory Visit: Payer: Self-pay

## 2020-07-01 VITALS — BP 139/80 | HR 70 | Temp 98.8°F | Ht 67.0 in | Wt 150.0 lb

## 2020-07-01 DIAGNOSIS — M25562 Pain in left knee: Secondary | ICD-10-CM | POA: Diagnosis present

## 2020-07-01 DIAGNOSIS — G8929 Other chronic pain: Secondary | ICD-10-CM

## 2020-07-01 MED ORDER — MELOXICAM 15 MG PO TABS: 15.0000 mg | ORAL_TABLET | Freq: Every day | ORAL | 2 refills | Status: DC

## 2020-07-01 MED ORDER — GABAPENTIN 600 MG PO TABS: 600.0000 mg | ORAL_TABLET | Freq: Three times a day (TID) | ORAL | 1 refills | Status: DC

## 2020-07-01 MED ORDER — METHOCARBAMOL 500 MG PO TABS: 500.0000 mg | ORAL_TABLET | Freq: Four times a day (QID) | ORAL | 1 refills | Status: DC | PRN

## 2020-07-01 MED ORDER — PREDNISONE 1 MG PO TABS: 1.0000 mg | ORAL_TABLET | Freq: Every day | ORAL | 1 refills | Status: DC

## 2020-07-01 NOTE — Addendum Note (Signed)
Addended by: Izora Ribas on: 07/01/2020 12:17 PM   Modules accepted: Orders

## 2020-07-01 NOTE — Progress Notes (Signed)
Subjective:    Patient ID: Sharon [name], female    DOB: 06/24/1965, 55 y.o.   MRN: [id number]  HPI  Sharon [name] is a 55 year old woman who presents for follow-up of bilateral knee pain following left knee replacement in 04/2019.   1) Bilateral knee pain:  -Her ROM is still restricted in her left knee.  -She asks whether the scar tissue will ever heal.  -She has follow-up with Dr. Ninfa Linden next week -She has been off hydrocodone.  -She would be interested in increasing the Gabapentin dose.  -She would like to discuss an option for inflammation. She has tried aspirations but the fluid comes back right away.   2) Anxiety: This has much improved with the Gabapentin.  Prior history:  Since last visit she has stopped Norco as it caused respiratory distress. We tried Cymbalta 40mg  without benefit or side effects. She has tried voltaren gel, blue emu oil without benefit. She has tried CBD.   Her daughter was hospitalized with COVID pneumonia and is 8 months pregnant.   She still has a fever of 99.3. She took an antibiotic for ear pain and this helped her symptoms. It also helped her breathing.   Current dose of Norco is working well for her. It causes her to have hot flashes but she would still like to continue with this dose.  Discussed Sprint PNS with her again and she still does not like the thought of this option.  She did feel that some of the scar tissue has broken.   Prior history: She has been trying to improve quadriceps musculature. She discusses some of the exercises she does.   She has been denied in her disability. She has talked to an attorney.  She continues to ambulate with a cane.   She has tried the compounding cream and that does not help much. She has been trying Hemp cream which is helping.  She is getting foot insoles from the podiatrist. They should be ready in a week or two.   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   Can ride the stationary bicycle and that does not hurt. She sues it every day. She uses leg weights. When   Scar tissue grew back quickly post-surgery. She could not get PT appointments for 3 weeks.   Her surgeon does not want her to break the hardware and he may have to do arthroscopic procedure to flush out the inflammation.   She has tried ice which stopped helping, nabumetone 750 mg BID, Norco 1-2 tablets three times per day PRN (she has 12 left). She uses diclofenac gel   She takes Miralax for constipation. She takes up to 2 times per day. She has a history of IBS.   [facility] in [place]- she was a Research scientist (physical sciences). They let her go because she did not return in 12 weeks.   She was doing pool therapy but chemicals in the pool were increased because of COVID.  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 Notes reviewed- recently had a right subacromial injection.   Current pain is 8/10.    Pain Inventory Average Pain 8 Pain Right Now 7 My pain is intermittent, sharp, burning, stabbing and aching  In the last 24 hours, has pain interfered with the following? General activity 10 Relation with others 10 Enjoyment  of life 10 What TIME of day is your pain at its worst? daytime, evening Sleep (in general) Fair  Pain is worse with: walking, bending and standing Pain improves with: rest, heat/ice and medication Relief from Meds: 6  Mobility walk with assistance use a cane how many minutes can you walk?  15 mins ability to climb steps?  no do you drive?  yes Do you have any goals in this area?  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  yes  Neuro/Psych tremor trouble walking spasms anxiety  Prior Studies Any changes  since last visit?  yes  Physicians involved in your care New Patient   Family History  Problem Relation Age of Onset  . Emphysema Mother        smoker  . Allergies Mother   . Asthma Mother   . Heart disease Mother        AMI as cause of death  . COPD Mother   . Asthma Father   . Heart disease Father        AMI as cause of death  . Diabetes Father   . Asthma Brother   . Heart disease Brother 62       heart failure  . Lung cancer Maternal Grandfather        was a smoker  . Cancer Maternal Grandfather        lung  . Heart disease Paternal Grandmother   . Heart disease Paternal Grandfather   . Colon cancer Neg Hx    Social History   Socioeconomic History  . Marital status: Soil scientist    Spouse name: Not on file  . Number of children: 1  . Years of education: Not on file  . Highest education level: Not on file  Occupational History  . Occupation: Restaurant manager, fast food: [employer]  Tobacco Use  . Smoking status: Never Smoker  . Smokeless tobacco: Never Used  Vaping Use  . Vaping Use: Never used  Substance and Sexual Activity  . Alcohol use: Not Currently    Comment: occ  . Drug use: No    Comment: former maryjuana use- quit in 1995  . Sexual activity: Yes    Birth control/protection: Surgical  Other Topics Concern  . Not on file  Social History Narrative   Marital status: divorced x 2; dating seriously x 3 years.        Children:  1 daughter (56); no grandchildren      Lives:  With boyfriend.        Employment: works at [employer]: none      Alcohol: rarely; socially      Exercise: none; has treadmill and elliptical and bikes   Coffee in am / 1/2 of coke in afternoon    High school education      01/29/20   From: the area   Living: alone - but boyfriend living with her Sharon [name] (2020) but knew each other in high school   Work: on disability - working on appeal      Family: one daughter Sharon [name] - one grandchild  coming in January       Enjoys: relax and watch tv, use to like fishing/boating/rafting, reading, crochet  Exercise: rehab exercises   Diet: not great - limited intake      Safety   Seat belts: Yes    Guns: Yes  and secure   Right handed   One story home   Drinks caffeine   Social Determinants of Health   Financial Resource Strain: Not on file  Food Insecurity: Not on file  Transportation Needs: Not on file  Physical Activity: Not on file  Stress: Not on file  Social Connections: Not on file   Past Surgical History:  Procedure Laterality Date  . ABDOMINAL HYSTERECTOMY    . CATARACT EXTRACTION Bilateral 2015  . COLONOSCOPY    . CYSTOSCOPY W/ URETERAL STENT PLACEMENT Right 05/23/2017   Procedure: CYSTOSCOPY WITH RETROGRADE PYELOGRAM/URETERAL RIGHT STENT PLACEMENT;  Surgeon: Bjorn Loser, MD;  Location: [facility];  Service: Urology;  Laterality: Right;  . CYSTOSCOPY W/ URETERAL STENT PLACEMENT Right 06/04/2017   Procedure: CYSTOSCOPY WITHRIGHT RETROGRADE PYELOGRAMRIGHT /URETEREOSCOPY  STENT PLACEMENT;  Surgeon: Lucas Mallow, MD;  Location[facility];  Service: Urology;  Laterality: Right;  . ENDOMETRIAL ABLATION    . EYE SURGERY Bilateral    cataract surgery with lens implants  . [place]  2011  . INCONTINENCE SURGERY    . INNER EAR SURGERY     X 2  . KNEE SURGERY    . RECTAL PROLAPSE REPAIR    . RETINAL DETACHMENT SURGERY Bilateral 2000  . RETINAL DETACHMENT SURGERY Bilateral   . TOTAL KNEE ARTHROPLASTY Left 04/29/2019  . TOTAL KNEE ARTHROPLASTY Left 04/29/2019   Procedure: LEFT TOTAL KNEE ARTHROPLASTY;  Surgeon: Mcarthur Rossetti, MD;  Location: [facility];  Service: Orthopedics;  Laterality: Left;  . TUBAL LIGATION    . VEIN SURGERY Left 04/2010   Past Medical History:  Diagnosis Date  . Allergy   . Anal fissure   . Anemia    borderline  . Anxiety   . Arthritis   . Blood clot in vein    left leg  . Detached retina     BOTH SCLERA BUCKLE BOTH EYES  . DVT (deep venous thrombosis) (HCC)    left leg  . Endometriosis   . Family history of adverse reaction to anesthesia    DAUGHTER POST OP PONV  . GERD (gastroesophageal reflux disease)   . Glaucoma    RIGHT WORST THAN LEFT  . Heart murmur    "caused by anxiety"   . History of hemorrhoids   . History of kidney stones   . Hypertension   . IBS (irritable bowel syndrome)   . Perforated eardrum, right    SMALL HOLE  . PONV (postoperative nausea and vomiting)   . Pulmonary embolism ([place]) 6 YRS AGO   LMP 10/11/2010   Opioid Risk Score:   Fall Risk Score:  `1  Depression screen PHQ 2/9  Depression screen [facility] 2/9 04/09/2020 01/29/2020 01/23/2020 06/09/2019 01/20/2019 07/22/2018 07/19/2018  Decreased Interest 0 3 3 0 0 0 0  Down, Depressed, Hopeless 0 2 3 0 0 0 0  PHQ - 2 Score 0 5 6 0 0 0 0  Altered sleeping - 2 3 - - - -  Tired, decreased energy - 2 3 - - - -  Change in appetite - 2 3 - - - -  Feeling bad or failure about yourself  - 0 0 - - - -  Trouble concentrating - 1 1 - - - -  Moving slowly or fidgety/restless - 0 0 - - - -  Suicidal thoughts - 0 0 - - - -  PHQ-9 Score - 12 16 - - - -  Difficult doing work/chores - Very difficult - - - - -  Some recent data might be hidden   Review of Systems  Constitutional: Negative.        Weight loss  HENT: Negative.   Eyes: Negative.   Respiratory: Positive for shortness of breath.   Cardiovascular: Negative.   Gastrointestinal: Negative.   Endocrine: Negative.   Genitourinary: Negative.   Musculoskeletal: Positive for arthralgias, back pain and gait problem.       Spasms  Skin: Negative.   Allergic/Immunologic: Negative.   Neurological: Positive for tremors and numbness.  Hematological: Negative.   Psychiatric/Behavioral:       Anxiety  All other systems reviewed and are negative.      Objective:   Physical Exam Gen: no distress, normal appearing HEENT: oral mucosa pink and moist,  NCAT Cardio: Reg rate Chest: normal effort, normal rate of breathing Abd: soft, non-distended Ext: no edema Psych: pleasant, normal affect Skin: intact Musculoskeletal: Atrophy of left thigh, swelling and bruising on left knee. Not tight and full currently. Right knee is also tender to palpation Psych: pleasant, normal affect, in positive spirits.     Assessment & Plan:  Sharon [name] is a 55 year old woman who presents for follow-up of:  1) Left knee pain post-surgery: -She has a plan for repeat surgical procedure with Dr. Ninfa Linden that will hopefully provide relief.  -She has been icing regularly- recommended three tines per day for 15 minutes.  -She has been through physical therapy and performs her exercises diligently.  -I have refilled Robaxin as she is nervous about feeling too sleepy with the Tizanidine, and the Robaxin does help.   -Increase Gabapentin to 600mg  TID PRN.   -She has tried Celebrex and Tramadol without benefit. She had respiratory distress with hydrocodone.   She had worse pain with steroid injection in her left knee.   -Discussed steroids but she did not like the side effect profile. Discussed the different steroid options.   -Continue turmeric and cherries.   -Prescribed Robaxin and Lidocaine patches today.   -Discussed Sprint PNS system as an option of pain treatment via neuromodulation. She defers at this time.   -Provided dietary counseling regarding opioid induced constipation.  -She would like rheum panel checked: ordered, reviewed results which were negative, and discussed with patient.  -Gabapentin 100mg  TID prescribed.   Bunion: Referred to podiatry for bunion and orthotic fitting. She will be receiving these in 2 weeks.   Irritable bowel syndrome: She does not eat much sugar or sweets. She drinks 1 coke per day. She cuts tea out. She drinks water, coffee.   Concentration: She feels that her concentration may have been impacted by her opioid.  Discussed that we can wean her off the narcotics if she would like.   General: Her daughter will be giving birth to her first grandchild on January 3rd, she hopes to be pain-free enough to play with her.   Fever, dysuria: Discussed that UA/UC was negative.   40 minutes spent in discussion of pain, PT, Gabapentin, physical examination, discussion of surgery, range of motion, heat therapy, cold therapy

## 2020-07-14 ENCOUNTER — Other Ambulatory Visit: Payer: Self-pay | Admitting: Physical Medicine and Rehabilitation

## 2020-07-17 ENCOUNTER — Other Ambulatory Visit: Payer: Self-pay | Admitting: Family Medicine

## 2020-07-17 DIAGNOSIS — K219 Gastro-esophageal reflux disease without esophagitis: Secondary | ICD-10-CM

## 2020-07-20 ENCOUNTER — Other Ambulatory Visit (HOSPITAL_COMMUNITY): Payer: Self-pay | Admitting: Orthopedic Surgery

## 2020-07-20 ENCOUNTER — Other Ambulatory Visit: Payer: Self-pay | Admitting: Orthopedic Surgery

## 2020-07-20 DIAGNOSIS — Z96652 Presence of left artificial knee joint: Secondary | ICD-10-CM

## 2020-07-21 ENCOUNTER — Other Ambulatory Visit: Payer: Self-pay | Admitting: Physical Medicine and Rehabilitation

## 2020-07-21 MED ORDER — MELOXICAM 15 MG PO TABS: 15.0000 mg | ORAL_TABLET | Freq: Every day | ORAL | 3 refills | Status: DC

## 2020-07-26 ENCOUNTER — Encounter: Payer: Self-pay | Admitting: Orthopaedic Surgery

## 2020-07-26 ENCOUNTER — Ambulatory Visit (INDEPENDENT_AMBULATORY_CARE_PROVIDER_SITE_OTHER): Payer: PRIVATE HEALTH INSURANCE | Admitting: Orthopaedic Surgery

## 2020-07-26 DIAGNOSIS — M24662 Ankylosis, left knee: Secondary | ICD-10-CM | POA: Diagnosis not present

## 2020-07-26 DIAGNOSIS — Z96652 Presence of left artificial knee joint: Secondary | ICD-10-CM | POA: Insufficient documentation

## 2020-07-26 NOTE — Progress Notes (Signed)
The patient is now about 15 months status post a left total knee arthroplasty. This was done with a press-fit implant. She is only 55 years old. Her postoperative course was complicated by severe pain that limited her mobility of that knee. She was never able to get past 90 degrees of flexion in spite of a manipulation as well. She has been able to obtain full extension which is good. She has seen one of our colleagues in town for second opinion. She is scheduled to get a three-phase bone scan. He feels that there is too much slope in the tibial component and that is keeping her from bending in the knee back. The Stryker press-fit knee system has a zero slope and use a so we sided that. We will try to limit taking bone and she has a 9 mm thickness insert. I feel that a lot of her issues are related to scarring of that knee. He is recommended against any type of surgery such as a polyexchange with scar tissue removal because he does not think that she will be able to get her knee flexed back further. Another option is revising the tibial component only but this certainly could actually affect her from a pain standpoint given that she is in pain management as well. She is requesting a knee brace today as well as a prescription for a light wheelchair for when she is on trips for getting around.  Examination of her left knee today shows her extension is full. There is no swelling or effusion of the knee. She only flexes to 90 degrees.  We will see what the three-phase bone scan shows in she is still following up with my colleague from the results from that. I talked her length about her knee as well and tried answers most questions as I could for her.  She also reports that she has right knee pain now. Which she is been dealing with bilateral shoulder pain as well as now arthritis in both her feet. She is in chronic pain management. She is still unable to work to any degree because of the pain that she is  experiencing when she is sitting as well as when she is trying to ambulate. This affects her concentration as well and she is not able to participate in any even sedentary gainful employment due to her pain. She will continue to be disabled by this in the foreseeable future.

## 2020-07-27 ENCOUNTER — Encounter (HOSPITAL_COMMUNITY)
Admission: RE | Admit: 2020-07-27 | Discharge: 2020-07-27 | Disposition: A | Discharge status: Home / Self Care | Payer: PRIVATE HEALTH INSURANCE | Source: Ambulatory Visit | Attending: Orthopedic Surgery | Admitting: Orthopedic Surgery

## 2020-07-27 ENCOUNTER — Other Ambulatory Visit: Payer: Self-pay

## 2020-07-27 DIAGNOSIS — Z96652 Presence of left artificial knee joint: Secondary | ICD-10-CM | POA: Insufficient documentation

## 2020-07-27 MED ORDER — TECHNETIUM TC 99M MEDRONATE IV KIT
20.0000 | PACK | Freq: Once | INTRAVENOUS | Status: AC | PRN
  Administered 2020-07-27: 20.2 via INTRAVENOUS

## 2020-08-10 ENCOUNTER — Encounter: Payer: Self-pay | Admitting: Physical Medicine and Rehabilitation

## 2020-08-10 ENCOUNTER — Other Ambulatory Visit: Payer: Self-pay

## 2020-08-10 ENCOUNTER — Encounter
Payer: PRIVATE HEALTH INSURANCE | Attending: Physical Medicine and Rehabilitation | Admitting: Physical Medicine and Rehabilitation

## 2020-08-10 VITALS — BP 141/88 | HR 76 | Temp 98.8°F | Ht 67.0 in | Wt 156.4 lb

## 2020-08-10 DIAGNOSIS — M25562 Pain in left knee: Secondary | ICD-10-CM | POA: Diagnosis present

## 2020-08-10 DIAGNOSIS — G8929 Other chronic pain: Secondary | ICD-10-CM | POA: Insufficient documentation

## 2020-08-10 DIAGNOSIS — K589 Irritable bowel syndrome without diarrhea: Secondary | ICD-10-CM | POA: Diagnosis present

## 2020-08-10 DIAGNOSIS — G4701 Insomnia due to medical condition: Secondary | ICD-10-CM | POA: Insufficient documentation

## 2020-08-10 DIAGNOSIS — F411 Generalized anxiety disorder: Secondary | ICD-10-CM | POA: Diagnosis present

## 2020-08-10 DIAGNOSIS — R41840 Attention and concentration deficit: Secondary | ICD-10-CM | POA: Diagnosis present

## 2020-08-10 DIAGNOSIS — M7551 Bursitis of right shoulder: Secondary | ICD-10-CM | POA: Diagnosis present

## 2020-08-10 DIAGNOSIS — M21619 Bunion of unspecified foot: Secondary | ICD-10-CM | POA: Insufficient documentation

## 2020-08-10 MED ORDER — AMITRIPTYLINE HCL 10 MG PO TABS: 10.0000 mg | ORAL_TABLET | Freq: Every day | ORAL | 1 refills | Status: DC

## 2020-08-10 MED ORDER — MELOXICAM 15 MG PO TABS: 15.0000 mg | ORAL_TABLET | Freq: Every day | ORAL | 3 refills | Status: DC

## 2020-08-10 MED ORDER — GABAPENTIN 600 MG PO TABS
600.0000 mg | ORAL_TABLET | Freq: Three times a day (TID) | ORAL | 1 refills | Status: DC
Start: 2020-08-10 — End: 2020-09-22

## 2020-08-10 MED ORDER — EPINEPHRINE 0.3 MG/0.3ML IJ SOAJ
0.3000 mg | Freq: Once | INTRAMUSCULAR | 1 refills | Status: DC | PRN
Start: 2020-08-10 — End: 2021-04-13
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
## 2020-08-10 NOTE — Addendum Note (Signed)
Addended by: Izora Ribas on: 08/10/2020 12:47 PM   Modules accepted: Orders

## 2020-08-10 NOTE — Addendum Note (Signed)
Addended by: Izora Ribas on: 08/10/2020 12:34 PM   Modules accepted: Level of Service

## 2020-08-12 ENCOUNTER — Other Ambulatory Visit: Payer: Self-pay | Admitting: Physical Medicine and Rehabilitation

## 2020-08-13 ENCOUNTER — Other Ambulatory Visit: Payer: Self-pay | Admitting: Family Medicine

## 2020-08-13 DIAGNOSIS — K219 Gastro-esophageal reflux disease without esophagitis: Secondary | ICD-10-CM

## 2020-08-13 NOTE — Telephone Encounter (Signed)
Pharmacy requests refill on: Famotidine 20 mg   LAST REFILL: 07/19/2020 (Q-60, R-1) LAST OV: 06/07/2020 NEXT OV: 10/07/2020 PHARMACY: [id number] [place], [place]

## 2020-08-15 ENCOUNTER — Other Ambulatory Visit: Payer: Self-pay | Admitting: Physical Medicine and Rehabilitation

## 2020-08-15 MED ORDER — MELOXICAM 15 MG PO TABS: 15.0000 mg | ORAL_TABLET | Freq: Every day | ORAL | 2 refills | Status: DC

## 2020-08-17 ENCOUNTER — Other Ambulatory Visit: Payer: Self-pay | Admitting: Family Medicine

## 2020-08-17 DIAGNOSIS — R053 Chronic cough: Secondary | ICD-10-CM

## 2020-08-17 DIAGNOSIS — R0789 Other chest pain: Secondary | ICD-10-CM

## 2020-08-17 NOTE — Telephone Encounter (Signed)
Pharmacy requests refill on: Albuterol 108 mcg/act Inhaler   LAST REFILL: 06/07/2020 (Q-8 g, R-2)  LAST OV: 06/07/2020 NEXT OV: 10/07/2020 PHARMACY: [place], [place]

## 2020-08-22 ENCOUNTER — Other Ambulatory Visit: Payer: Self-pay | Admitting: Physical Medicine and Rehabilitation

## 2020-09-04 ENCOUNTER — Other Ambulatory Visit: Payer: Self-pay | Admitting: Physical Medicine and Rehabilitation

## 2020-09-22 ENCOUNTER — Telehealth: Payer: Self-pay | Admitting: Family Medicine

## 2020-09-22 ENCOUNTER — Other Ambulatory Visit: Payer: Self-pay

## 2020-09-22 ENCOUNTER — Encounter
Payer: PRIVATE HEALTH INSURANCE | Attending: Physical Medicine and Rehabilitation | Admitting: Physical Medicine and Rehabilitation

## 2020-09-22 ENCOUNTER — Encounter: Payer: Self-pay | Admitting: Physical Medicine and Rehabilitation

## 2020-09-22 VITALS — BP 162/84 | HR 72 | Temp 99.0°F | Ht 67.0 in | Wt 156.0 lb

## 2020-09-22 DIAGNOSIS — M25562 Pain in left knee: Secondary | ICD-10-CM | POA: Diagnosis present

## 2020-09-22 DIAGNOSIS — N94819 Vulvodynia, unspecified: Secondary | ICD-10-CM | POA: Insufficient documentation

## 2020-09-22 DIAGNOSIS — G8929 Other chronic pain: Secondary | ICD-10-CM | POA: Diagnosis not present

## 2020-09-22 DIAGNOSIS — M5440 Lumbago with sciatica, unspecified side: Secondary | ICD-10-CM | POA: Insufficient documentation

## 2020-09-22 MED ORDER — GABAPENTIN 600 MG PO TABS
600.0000 mg | ORAL_TABLET | Freq: Three times a day (TID) | ORAL | 1 refills | Status: DC
Start: 2020-09-22 — End: 2020-11-04

## 2020-09-22 NOTE — Telephone Encounter (Signed)
Patient has a 56 year old aunt that is need of a pcp. She was wondering if you would be willing to take her on as a new patient because its easier for her to bring her in to [place] when she has an appt. Please advise. EM

## 2020-09-22 NOTE — Progress Notes (Signed)
Subjective:    Patient ID: Sharon [name], female    DOB: Apr 28, 1966, 55 y.o.   MRN: [id number]  HPI  Sharon [name] is a 55 year old woman who presents for follow-up of bilateral knee pain following left knee replacement in 04/2019.   1) Bilateral knee pain:  -Her ROM is still restricted in her left knee.  -She asks whether the scar tissue will ever heal.  -She has follow-up with Dr. Mayer [name] and Dr. Ninfa [name] -Currently is not planning to pursue surgery.  -She has been off hydrocodone.  -She would be interested in increasing the Gabapentin dose.  -She would like to discuss an option for inflammation. She has tried aspirations but the fluid comes back right away.  -She got a  Steroid injection and experienced worsening range of motion in her left knee- that was from Dr. Mayer [name]. He also pulled fluid off. It does help with pain, but she does not want another given her worsening range of motion afterward. She does have improved sensation in her knee after the injection! -has been hurting  2) Anxiety: This has much improved with the Gabapentin.  3) Impaired balance: -anticipates she will soon have difficulty picking up her granddaughter -she was been watching her granddaughter three days per week  4) low back pain:  -has been worse since she carried her grandchild -she is unable to vacuum.  -discussed therapy and using lidocaine patch, massage.   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 We tried Cymbalta 40mg  without benefit or side effects. She has tried voltaren gel, blue emu oil without benefit. She has tried CBD.   Her daughter was hospitalized with COVID pneumonia and is 8 months pregnant.   She still has a fever of 99.3. She took an antibiotic for ear pain and this helped her symptoms. It also helped her breathing.   Current dose of Norco is  working well for her. It causes her to have hot flashes but she would still like to continue with this dose.  Discussed Sprint PNS with her again and she still does not like the thought of this option.  She did feel that some of the scar tissue has broken.   Prior history: She has been trying to improve quadriceps musculature. She discusses some of the exercises she does.   She has been denied in her disability. She has talked to an attorney.  She continues to ambulate with a cane.   She has tried the compounding cream and that does not help much. She has been trying Hemp cream which is helping.  She is getting foot insoles from the podiatrist. They should be ready in a week or two.   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   Can ride the stationary bicycle and that does not hurt. She sues it every day. She uses leg weights. When   Scar tissue grew back quickly post-surgery. She could not get PT appointments for 3 weeks.   Her surgeon does not want her to break the hardware and he may have to do arthroscopic procedure to flush out the inflammation.   She has tried ice which stopped helping, nabumetone 750 mg BID, Norco 1-2 tablets three times per day PRN (  she has 12 left). She uses diclofenac gel   She takes Miralax for constipation. She takes up to 2 times per day. She has a history of IBS.   [facility] in [place]- she was a Research scientist (physical sciences). They let her go because she did not return in 12 weeks.   She was doing pool therapy but chemicals in the pool were increased because of COVID.  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 Notes reviewed- recently had a right subacromial injection.   Current pain is 8/10.    Pain Inventory Average Pain 8 Pain Right Now 8 My pain is intermittent, sharp, burning, stabbing and aching  In the last 24 hours, has pain  interfered with the following? General activity 9 Relation with others 9 Enjoyment of life 10 What TIME of day is your pain at its worst? daytime, evening Sleep (in general) Good  Pain is worse with: walking, bending, sitting, inactivity and standing Pain improves with: rest, heat/ice and medication Relief from Meds: 6  Mobility walk with assistance use a cane how many minutes can you walk?  15 mins ability to climb steps?  no do you drive?  yes Do you have any goals in this area?  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  yes  Neuro/Psych tremor trouble walking spasms anxiety  Prior Studies Any changes since last visit?  yes  Physicians involved in your care New Patient   Family History  Problem Relation Age of Onset  . Emphysema Mother        smoker  . Allergies Mother   . Asthma Mother   . Heart disease Mother        AMI as cause of death  . COPD Mother   . Asthma Father   . Heart disease Father        AMI as cause of death  . Diabetes Father   . Asthma Brother   . Heart disease Brother 66       heart failure  . Lung cancer Maternal Grandfather        was a smoker  . Cancer Maternal Grandfather        lung  . Heart disease Paternal Grandmother   . Heart disease Paternal Grandfather   . Colon cancer Neg Hx    Social History   Socioeconomic History  . Marital status: Soil scientist    Spouse name: Not on file  . Number of children: 1  . Years of education: Not on file  . Highest education level: Not on file  Occupational History  . Occupation: Restaurant manager, fast food: [employer]  Tobacco Use  . Smoking status: Never Smoker  . Smokeless tobacco: Never Used  Vaping Use  . Vaping Use: Never used  Substance and Sexual Activity  . Alcohol use: Not  Currently    Comment: occ  . Drug use: No    Comment: former maryjuana use- quit in 1995  . Sexual activity: Yes    Birth control/protection: Surgical  Other Topics Concern  . Not on file  Social History Narrative   Marital status: divorced x 2; dating seriously x 3 years.        Children:  1 daughter (25); no grandchildren      Lives:  With boyfriend.        Employment: works at [employer]: none      Alcohol: rarely; socially      Exercise: none; has treadmill and elliptical and bikes   Coffee in am / 1/2 of coke in afternoon    High school education      01/29/20   From: the area   Living: alone - but boyfriend living with her Wilfred Lacy (2020) but knew each other in high school   Work: on disability - working on appeal      Family: one daughter [name] - one grandchild coming in January       Enjoys: relax and watch tv, use to like fishing/boating/rafting, reading, crochet       Exercise: rehab exercises   Diet: not great - limited intake      Safety   Seat belts: Yes    Guns: Yes  and secure   Right handed   One story home   Drinks caffeine   Social Determinants of Health   Financial Resource Strain: Not on file  Food Insecurity: Not on file  Transportation Needs: Not on file  Physical Activity: Not on file  Stress: Not on file  Social Connections: Not on file   Past Surgical History:  Procedure Laterality Date  . ABDOMINAL HYSTERECTOMY    . CATARACT EXTRACTION Bilateral 2015  . COLONOSCOPY    . CYSTOSCOPY W/ URETERAL STENT PLACEMENT Right 05/23/2017   Procedure: CYSTOSCOPY WITH RETROGRADE PYELOGRAM/URETERAL RIGHT STENT PLACEMENT;  Surgeon: Bjorn Loser, MD;  Location: [facility];  Service: Urology;  Laterality: Right;  . CYSTOSCOPY W/ URETERAL STENT PLACEMENT Right 06/04/2017   Procedure: CYSTOSCOPY WITHRIGHT RETROGRADE PYELOGRAMRIGHT /URETEREOSCOPY  STENT PLACEMENT;  Surgeon: Lucas Mallow, MD;  Location: [facility];  Service:  Urology;  Laterality: Right;  . ENDOMETRIAL ABLATION    . EYE SURGERY Bilateral    cataract surgery with lens implants  . [place]  2011  . INCONTINENCE SURGERY    . INNER EAR SURGERY     X 2  . KNEE SURGERY    . RECTAL PROLAPSE REPAIR    . RETINAL DETACHMENT SURGERY Bilateral 2000  . RETINAL DETACHMENT SURGERY Bilateral   . TOTAL KNEE ARTHROPLASTY Left 04/29/2019  . TOTAL KNEE ARTHROPLASTY Left 04/29/2019   Procedure: LEFT TOTAL KNEE ARTHROPLASTY;  Surgeon: Mcarthur Rossetti, MD;  Location: [facility];  Service: Orthopedics;  Laterality: Left;  . TUBAL LIGATION    . VEIN SURGERY Left 04/2010   Past Medical History:  Diagnosis Date  . Allergy   . Anal fissure   . Anemia    borderline  . Anxiety   . Arthritis   . Blood clot in vein    left leg  . Detached retina    BOTH SCLERA BUCKLE BOTH EYES  . DVT (deep venous thrombosis) (HCC)    left leg  . Endometriosis   . Family history of adverse reaction to anesthesia    DAUGHTER POST OP PONV  . GERD (gastroesophageal reflux disease)   . Glaucoma    RIGHT WORST THAN LEFT  . Heart murmur    "caused by anxiety"   . History of hemorrhoids   . History of kidney stones   . Hypertension   . IBS (irritable bowel syndrome)   . Perforated eardrum, right    SMALL HOLE  . PONV (postoperative nausea and vomiting)   . Pulmonary  embolism ([place]) 6 YRS AGO   LMP 10/11/2010   Opioid Risk Score:   Fall Risk Score:  `1  Depression screen PHQ 2/9  Depression screen [facility] 2/9 04/09/2020 01/29/2020 01/23/2020 06/09/2019 01/20/2019 07/22/2018 07/19/2018  Decreased Interest 0 3 3 0 0 0 0  Down, Depressed, Hopeless 0 2 3 0 0 0 0  PHQ - 2 Score 0 5 6 0 0 0 0  Altered sleeping - 2 3 - - - -  Tired, decreased energy - 2 3 - - - -  Change in appetite - 2 3 - - - -  Feeling bad or failure about yourself  - 0 0 - - - -  Trouble concentrating - 1 1 - - - -  Moving slowly or fidgety/restless - 0 0 - - - -  Suicidal thoughts - 0 0 - - - -   PHQ-9 Score - 12 16 - - - -  Difficult doing work/chores - Very difficult - - - - -  Some recent data might be hidden   Review of Systems  Constitutional: Negative.        Weight loss  HENT: Negative.   Eyes: Negative.   Respiratory: Positive for shortness of breath.   Cardiovascular: Negative.   Gastrointestinal: Negative.   Endocrine: Negative.   Genitourinary: Negative.   Musculoskeletal: Positive for arthralgias, back pain and gait problem.       Spasms  Skin: Negative.   Allergic/Immunologic: Negative.   Neurological: Positive for tremors and numbness.  Hematological: Negative.   Psychiatric/Behavioral:       Anxiety  All other systems reviewed and are negative.      Objective:   Physical Exam Gen: no distress, normal appearing HEENT: oral mucosa pink and moist, NCAT Cardio: Reg rate Chest: normal effort, normal rate of breathing Abd: soft, non-distended Ext: no edema Psych: pleasant, normal affect Skin: intact Musculoskeletal: Atrophy of left thigh, swelling and bruising on left knee. Not tight and full currently. Right knee is also tender to palpation. Palable trigger points in her upper and lower back.  Psych: pleasant, normal affect, in positive spirits.     Assessment & Plan:  Mrs. Raz is a 55 year old woman who presents for follow-up:   1) Left knee pain post-surgery: -She has a plan for repeat surgical procedure with Dr. Ninfa [name] that will hopefully provide relief.  -She has been icing regularly- recommended three tines per day for 15 minutes.  -She has been through physical therapy and performs her exercises diligently. --Discussed Sprint PNS system as an option of pain treatment via neuromodulation. Provided following link for patient to learn more about the system: https://www.sprtherapeutics.com/. She does not like the feeling of things under her skin and defers that option.  -She weaned herself all the steroids.   -Continue Robaxin as she is  nervous about feeling too sleepy with the Tizanidine, and the Robaxin does help.   -Continue Gabapentin to 600mg  TID PRN.   -She has tried Celebrex and Tramadol without benefit. She had respiratory distress with hydrocodone.   -She had worse pain with steroid injection in her left knee.   -Discussed steroids but she did not like the side effect profile. Discussed the different steroid options. She weaned off these because she developed mouth sores.   -Continue turmeric and cherries.   -Continue Lidocaine patches today.   -She would like rheum panel checked: ordered, reviewed results which were negative, and discussed with patient.  2) Bunion:  -  Referred to podiatry for bunion and orthotic fitting. She will be receiving these in 2 weeks.   3) Irritable bowel syndrome: -She does not eat much sugar or sweets.  -She drinks 1 coke per day.  -She cuts tea out.  -She drinks water, coffee.  -She has been going more regularly and has not needed her Miralax.   4) Impaired Concentration: - This persists -She forgets things easily.  -She tries not to multitask when she was with her grandmother.   5) Anxiety: The Gabapentin is helping with this as well, continue  6) Vulvodynia: -discussed various treatment options: biofeedback, medications, continue estradiol.  -continue Amitriptyline 10mg  HS, discussed moving dose earlier in the evening.   7) Cervical and lumbar myofascial pain: -try massage -discussed benefits of therapy and trigger point injections -apply lidocaine patch.

## 2020-09-23 NOTE — Telephone Encounter (Signed)
At this time I am closed to new patients. If she does not find anyone in the next few months she can call back this summer.

## 2020-09-28 NOTE — Telephone Encounter (Signed)
Spoke to pt and relayed dr. Verda Cumins message. Pt will continue to look for pcp for her aunt.

## 2020-10-07 ENCOUNTER — Other Ambulatory Visit: Payer: Self-pay

## 2020-10-07 ENCOUNTER — Ambulatory Visit (INDEPENDENT_AMBULATORY_CARE_PROVIDER_SITE_OTHER): Payer: PRIVATE HEALTH INSURANCE | Admitting: Family Medicine

## 2020-10-07 VITALS — BP 138/70 | HR 72 | Temp 98.2°F | Ht 67.0 in | Wt 158.2 lb

## 2020-10-07 DIAGNOSIS — G4700 Insomnia, unspecified: Secondary | ICD-10-CM | POA: Diagnosis not present

## 2020-10-07 DIAGNOSIS — I10 Essential (primary) hypertension: Secondary | ICD-10-CM

## 2020-10-07 DIAGNOSIS — F411 Generalized anxiety disorder: Secondary | ICD-10-CM

## 2020-10-07 DIAGNOSIS — J4520 Mild intermittent asthma, uncomplicated: Secondary | ICD-10-CM

## 2020-10-07 DIAGNOSIS — J45909 Unspecified asthma, uncomplicated: Secondary | ICD-10-CM | POA: Insufficient documentation

## 2020-10-07 DIAGNOSIS — K219 Gastro-esophageal reflux disease without esophagitis: Secondary | ICD-10-CM | POA: Diagnosis not present

## 2020-10-07 MED ORDER — CLONAZEPAM 0.5 MG PO TABS: 0.5000 mg | ORAL_TABLET | Freq: Every day | ORAL | 5 refills | Status: DC

## 2020-10-07 NOTE — Assessment & Plan Note (Signed)
Improved on famotidine. Cont 20 mg bid

## 2020-10-07 NOTE — Progress Notes (Signed)
Subjective:     Sharon [name] is a 55 y.o. female presenting for Follow-up (4 month) and Medication Refill     HPI   #HTN - [facility] is constantly running out of bystolic 5 mg - had to get a prior auth to have this refilled - recently was filled the generic instead of name brand - has been on this for 2 days   #insomnia - taking clonazepam - had been on this for 10+ years for anxiety - helping with restless legs and anxiety and deeper sleep  Prior knee replacement - got a second opinion about her chronic knee pain s/p her knee replacement - seeing PMR for pain  - cannot climb steps - going through the process to apply for disability  #cough/sob - using albuterol 4 times a week with improvement - worse with allergies -    Review of Systems   Social History   Tobacco Use  Smoking Status Never Smoker  Smokeless Tobacco Never Used        Objective:    BP Readings from Last 3 Encounters:  10/07/20 138/70  09/22/20 (!) 162/84  08/10/20 (!) 141/88   Wt Readings from Last 3 Encounters:  10/07/20 158 lb 4 oz (71.8 kg)  09/22/20 156 lb (70.8 kg)  08/10/20 156 lb 6.4 oz (70.9 kg)    BP 138/70   Pulse 72   Temp 98.2 F (36.8 C) (Temporal)   Ht 5\' 7"  (1.702 m)   Wt 158 lb 4 oz (71.8 kg)   LMP 10/11/2010   SpO2 99%   BMI 24.79 kg/m    Physical Exam Constitutional:      General: She is not in acute distress.    Appearance: She is well-developed. She is not diaphoretic.  HENT:     Right Ear: External ear normal.     Left Ear: External ear normal.  Eyes:     Conjunctiva/sclera: Conjunctivae normal.  Cardiovascular:     Rate and Rhythm: Normal rate and regular rhythm.     Heart sounds: No murmur heard.   Pulmonary:     Effort: Pulmonary effort is normal. No respiratory distress.     Breath sounds: Normal breath sounds. No wheezing.  Musculoskeletal:     Cervical back: Neck supple.  Skin:    General: Skin is warm and dry.     Capillary  Refill: Capillary refill takes less than 2 seconds.  Neurological:     Mental Status: She is alert. Mental status is at baseline.  Psychiatric:        Mood and Affect: Mood normal.        Behavior: Behavior normal.           Assessment & Plan:   Problem List Items Addressed This Visit      Cardiovascular and Mediastinum   Hypertension    At goal. Cont bystolic 5 mg. She will update if noticing higher numbers on generic as this was an issue in the past.         Respiratory   Reactive airway disease    Albuterol with significant improvement in cough/breathing. Discussed if using 4-5 times a week would likely recommend daily inhaler and pulm work-up for asthma. She will call if needing more frequently.         Digestive   Gastroesophageal reflux disease - Primary    Improved on famotidine. Cont 20 mg bid        Other   GAD (  generalized anxiety disorder)    Stable on clonazepam 0.5 mg. Return 6 months      Relevant Medications   clonazePAM (KLONOPIN) 0.5 MG tablet   Insomnia    Well controlled on clonazepam 0.5 mg nightly. Cont. Return 6 months      Relevant Medications   clonazePAM (KLONOPIN) 0.5 MG tablet       Return in about 6 months (around 04/08/2021) for annual and refills .  Lesleigh Noe, MD  This visit occurred during the SARS-CoV-2 public health emergency.  Safety protocols were in place, including screening questions prior to the visit, additional usage of staff PPE, and extensive cleaning of exam room while observing appropriate contact time as indicated for disinfecting solutions.

## 2020-10-07 NOTE — Patient Instructions (Signed)
#   Blood pressure - continue to monitor for high blood pressure or fast heart rate - if doing well, great can switch to generic - if not well controlled let me know and will send in refill for brand name bystolic

## 2020-10-07 NOTE — Assessment & Plan Note (Signed)
At goal. Cont bystolic 5 mg. She will update if noticing higher numbers on generic as this was an issue in the past.

## 2020-10-07 NOTE — Assessment & Plan Note (Signed)
Well controlled on clonazepam 0.5 mg nightly. Cont. Return 6 months

## 2020-10-07 NOTE — Assessment & Plan Note (Signed)
Albuterol with significant improvement in cough/breathing. Discussed if using 4-5 times a week would likely recommend daily inhaler and pulm work-up for asthma. She will call if needing more frequently.

## 2020-10-07 NOTE — Assessment & Plan Note (Signed)
Stable on clonazepam 0.5 mg. Return 6 months

## 2020-10-17 ENCOUNTER — Other Ambulatory Visit: Payer: Self-pay | Admitting: Family Medicine

## 2020-10-17 DIAGNOSIS — K219 Gastro-esophageal reflux disease without esophagitis: Secondary | ICD-10-CM

## 2020-10-22 ENCOUNTER — Telehealth: Payer: Self-pay

## 2020-10-22 NOTE — Telephone Encounter (Signed)
I called them. They need an addendum added to the note stating why pt needs light weight wheelchair over standard and why pt needs a wheelchair instead of cane, crutches or walker. Can you add this to you last note?

## 2020-10-22 NOTE — Telephone Encounter (Signed)
Colletta Maryland from [employer] called regarding a order for a wheelchair.  So she needs the support notes and insurance information

## 2020-10-22 NOTE — Telephone Encounter (Signed)
Fax # to fax note is [phone] once completed

## 2020-10-25 NOTE — Telephone Encounter (Signed)
I can not justify her needing this type of wheelchair over a standard wheelchair.  Would have to see her in the office again to document why a can or walker as well as a standard wheelchair wouldn't work.

## 2020-10-26 NOTE — Telephone Encounter (Signed)
I called pt and advised her a followup is needed for insurance approval . Pt stated she would need to call us back to schedule. Stated she might just buy one out of pocket because she is tired of dealing with insurance

## 2020-11-03 ENCOUNTER — Other Ambulatory Visit: Payer: Self-pay | Admitting: Physical Medicine and Rehabilitation

## 2020-11-03 ENCOUNTER — Encounter: Payer: PRIVATE HEALTH INSURANCE | Admitting: Physical Medicine and Rehabilitation

## 2020-11-04 ENCOUNTER — Other Ambulatory Visit: Payer: Self-pay | Admitting: Physical Medicine and Rehabilitation

## 2020-11-04 MED ORDER — MELOXICAM 15 MG PO TABS: 15.0000 mg | ORAL_TABLET | Freq: Every day | ORAL | 2 refills | Status: DC

## 2020-11-04 MED ORDER — GABAPENTIN 600 MG PO TABS
600.0000 mg | ORAL_TABLET | Freq: Three times a day (TID) | ORAL | 1 refills | Status: DC
Start: 2020-11-04 — End: 2020-12-14

## 2020-11-08 ENCOUNTER — Ambulatory Visit (INDEPENDENT_AMBULATORY_CARE_PROVIDER_SITE_OTHER): Payer: PRIVATE HEALTH INSURANCE | Admitting: Family Medicine

## 2020-11-08 ENCOUNTER — Encounter: Payer: Self-pay | Admitting: Family Medicine

## 2020-11-08 ENCOUNTER — Other Ambulatory Visit: Payer: Self-pay

## 2020-11-08 VITALS — BP 122/78 | HR 71 | Temp 98.0°F | Ht 67.0 in | Wt 158.0 lb

## 2020-11-08 DIAGNOSIS — H60392 Other infective otitis externa, left ear: Secondary | ICD-10-CM

## 2020-11-08 MED ORDER — NEOMYCIN-POLYMYXIN-HC 3.5-10000-1 OT SOLN: 3.0000 [drp] | Freq: Four times a day (QID) | OTIC | 0 refills | Status: DC

## 2020-11-08 NOTE — Progress Notes (Signed)
   Subjective:     Miamarie Moll is a 55 y.o. female presenting for Ear Pain (L x 1 week )     HPI   #Left ear pain - started 1 week ago - did have some hearing loss which improved when she tried to open up the ear canal  - has also had a odor from the ear - did have drainage - cannot take allergy medication due to drying out the eyes -  Review of Systems  Constitutional: Negative for chills and fever.  HENT: Positive for ear pain, hearing loss, postnasal drip, rhinorrhea and sinus pain. Negative for congestion, sinus pressure and tinnitus.   Eyes: Positive for itching. Negative for pain and redness.     Social History   Tobacco Use  Smoking Status Never Smoker  Smokeless Tobacco Never Used        Objective:    BP Readings from Last 3 Encounters:  11/08/20 122/78  10/07/20 138/70  09/22/20 (!) 162/84   Wt Readings from Last 3 Encounters:  11/08/20 158 lb (71.7 kg)  10/07/20 158 lb 4 oz (71.8 kg)  09/22/20 156 lb (70.8 kg)    BP 122/78   Pulse 71   Temp 98 F (36.7 C) (Temporal)   Ht 5\' 7"  (1.702 m)   Wt 158 lb (71.7 kg)   LMP 10/11/2010   SpO2 98%   BMI 24.75 kg/m    Physical Exam Constitutional:      General: She is not in acute distress.    Appearance: She is well-developed. She is not diaphoretic.  HENT:     Right Ear: External ear normal. Tympanic membrane is scarred.     Left Ear: Tympanic membrane normal. Swelling and tenderness present.     Ears:     Comments: Ear canal with some swelling, erythema, and tenderness    Nose: Nose normal.  Eyes:     Conjunctiva/sclera: Conjunctivae normal.  Cardiovascular:     Rate and Rhythm: Normal rate and regular rhythm.     Pulses: Normal pulses.  Pulmonary:     Effort: Pulmonary effort is normal. No respiratory distress.     Breath sounds: Normal breath sounds. No wheezing.  Musculoskeletal:     Cervical back: Neck supple.  Skin:    General: Skin is warm and dry.     Capillary Refill:  Capillary refill takes less than 2 seconds.  Neurological:     Mental Status: She is alert. Mental status is at baseline.  Psychiatric:        Mood and Affect: Mood normal.        Behavior: Behavior normal.           Assessment & Plan:   Problem List Items Addressed This Visit   None   Visit Diagnoses    Other infective acute otitis externa of left ear    -  Primary   Relevant Medications   neomycin-polymyxin-hydrocortisone (CORTISPORIN) OTIC solution     Advised drops for suspected otitis externa. Return if not improving.   Return if symptoms worsen or fail to improve.  Lesleigh Noe, MD  This visit occurred during the SARS-CoV-2 public health emergency.  Safety protocols were in place, including screening questions prior to the visit, additional usage of staff PPE, and extensive cleaning of exam room while observing appropriate contact time as indicated for disinfecting solutions.

## 2020-11-08 NOTE — Patient Instructions (Addendum)
Ear drops - use for at least 5 days  - can do up to 10 days of treatment   Dr. Lorelei Pont - also does orthotics

## 2020-11-13 IMAGING — US US BREAST*L* LIMITED INC AXILLA
1 series · 2 of 2 positions shown · non-contrast
Comparison: Previous exam(s).

CLINICAL DATA: Patient complains of focal left breast pain.

EXAM:
DIGITAL DIAGNOSTIC BILATERAL MAMMOGRAM WITH CAD AND TOMO
ULTRASOUND LEFT BREAST

[Series 1: us breast*left* limited inc axilla · 0.06mm/px · 2 of 2 slices shown]
[im 1/2]
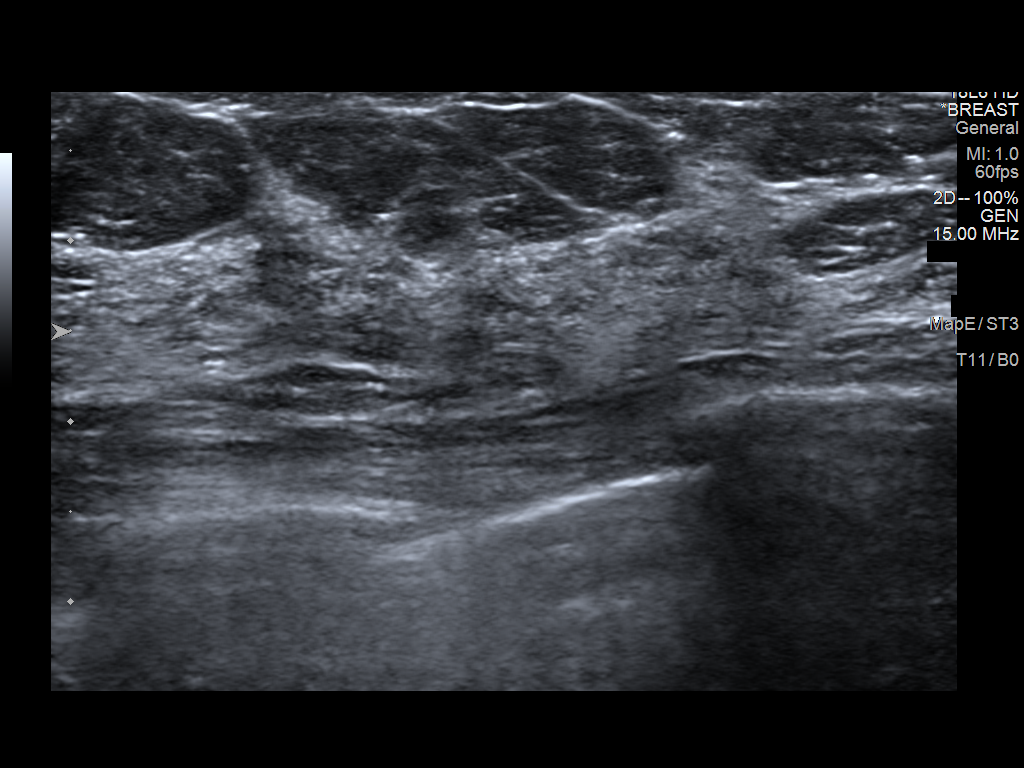
[im 2/2]
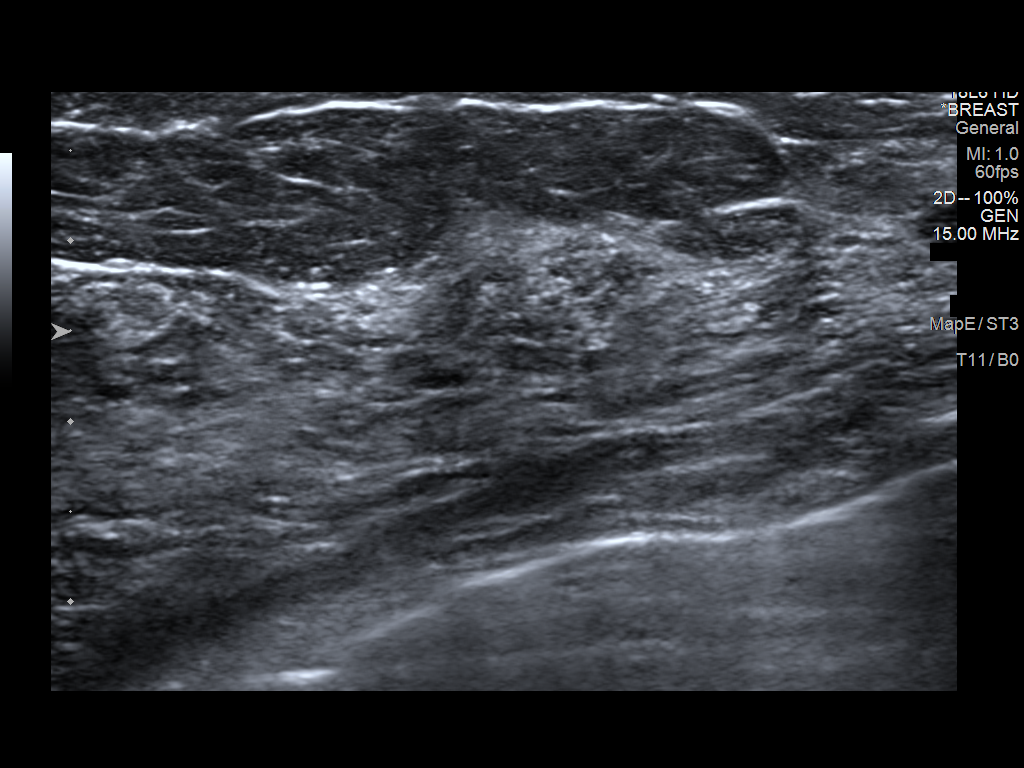

[2 of 2 positions shown; findings below may reference images not displayed]

ACR Breast Density Category c: The breast tissue is heterogeneously
dense, which may obscure small masses.
FINDINGS: No suspicious mass, malignant type microcalcifications or distortion
detected in either breast. Spot tangential view of the area of
clinical concern in the left breast shows normal fibroglandular
tissue.

Mammographic images were processed with CAD.

Targeted ultrasound is performed, showing normal tissue in the area
of clinical concern in the left breast at 2 o'clock 8 cm from the
nipple.
IMPRESSION: No evidence of malignancy in either breast.

RECOMMENDATION:
Bilateral screening mammogram in 1 year is recommended.

I have discussed the findings and recommendations with the patient.
If applicable, a reminder letter will be sent to the patient
regarding the next appointment.

BI-RADS CATEGORY  1: Negative.

## 2020-11-16 ENCOUNTER — Other Ambulatory Visit: Payer: Self-pay | Admitting: Neurology

## 2020-11-24 ENCOUNTER — Encounter: Payer: Self-pay | Admitting: Family Medicine

## 2020-11-29 NOTE — Progress Notes (Signed)
NEUROLOGY FOLLOW UP OFFICE NOTE  Sharon [name] [id number]  Assessment/Plan:   Essential tremor  1.Primidone 50mg  in AM and 150mg  at bedtime.  We can increase morning dose to 100mg  if needed. 2.Follow up one year  Subjective:  Sharon [name] is a 55 year old female with generalized anxiety disorder, restless leg syndrome, hypertension, glaucoma, arthritis, chronic pain and history of PE and DVT in left leg who follows up for essential tremor   UPDATE: Medications:  Primidone 50mg  in AM and 150mg  at bedtime); gabapentin 100mg  TID, Mobic, clonazepam 0.5mg  QHS, Bystolic, Robaxin, estradiol   Tremors are overall stable but notes some difficulty with writing and may drop objects.  They are off and on.    HISTORY: She started having tremors around 2015.  It involves both hands.  It interferes with daily tasks.  She drops a lot.  She has occasional trouble with utensils or writing.  Nothing particular makes it worse.  Steadying her wrist on the arm rest will suppress it.     Her mother and grandmother had tremor.   She has chronic generalized pain as well as neck pain.  She reports a creepy crawly feeling under her skin, including back and down the legs.  She has been diagnosed with restless leg syndrome.  MRI of cervical spine from 04/12/18 was personally reviewed and showed cervical spondylosis, degenerative disc disease and congenitally short pedicles, causing mild impingement at C3-4 on left, C6-7 on right and C7-T1 on left.  CT of right shoulder on 04/11/18 showed severe supraspinatus and infraspinatus tendinopathy without tear as well as small type 2 SLAP tear.  She notes a 'pinching" pain radiating down the lateral arm and into the fingers with associated numbness and tingling in the fingers.  MRI lumbar spine from 04/13/18 showed L4-L5 right foraminal disc protrusion that may be causing right L4 nerve root irritation.  L5-S1 moderate face arthrosis and small central protrusion with  annular fissure.  She underwent lab work on 03/04/2019 to evaluate chronic pain.  ANA was negative, RF negative, sed rate 5, CRP <0.1, CK 49   Past medications:  Cymbalta (worsened tremor)  PAST MEDICAL HISTORY: Past Medical History:  Diagnosis Date   Allergy    Anal fissure    Anemia    borderline   Anxiety    Arthritis    Blood clot in vein    left leg   Detached retina    BOTH SCLERA BUCKLE BOTH EYES   DVT (deep venous thrombosis) (HCC)    left leg   Endometriosis    Family history of adverse reaction to anesthesia    DAUGHTER POST OP PONV   GERD (gastroesophageal reflux disease)    Glaucoma    RIGHT WORST THAN LEFT   Heart murmur    "caused by anxiety"    History of hemorrhoids    History of kidney stones    Hypertension    IBS (irritable bowel syndrome)    Perforated eardrum, right    SMALL HOLE   PONV (postoperative nausea and vomiting)    Pulmonary embolism (HCC) 6 YRS AGO    MEDICATIONS: Current Outpatient Medications on File Prior to Visit  Medication Sig Dispense Refill   acetaminophen (TYLENOL) 500 MG tablet Take 1,000 mg by mouth every 8 (eight) hours as needed for moderate pain.      acyclovir (ZOVIRAX) 400 MG tablet Take 1 tablet (400 mg total) by mouth at bedtime. 90 tablet 3   albuterol (  VENTOLIN HFA) 108 (90 Base) MCG/ACT inhaler INHALE 2 PUFFS BY MOUTH EVERY 6 HOURS AS NEEDED FOR WHEEZE OR SHORTNESS OF BREATH 8.5 each 2   amitriptyline (ELAVIL) 10 MG tablet TAKE 1 TABLET BY MOUTH EVERYDAY AT BEDTIME 90 tablet 1   Cholecalciferol (VITAMIN D) 2000 UNITS tablet Take 2,000 Units by mouth daily.     clonazePAM (KLONOPIN) 0.5 MG tablet Take 1 tablet (0.5 mg total) by mouth at bedtime. 30 tablet 5   EPINEPHrine 0.3 mg/0.3 mL IJ SOAJ injection Inject 0.3 mg into the muscle once as needed (For anaphylaxis.). 1 each 1   estradiol (ESTRACE) 0.1 MG/GM vaginal cream Insert pea-sized amount nightly for 2 weeks then every other night     famotidine (PEPCID) 20 MG  tablet TAKE 1 TABLET BY MOUTH TWICE A DAY 180 tablet 1   gabapentin (NEURONTIN) 600 MG tablet Take 1 tablet (600 mg total) by mouth 3 (three) times daily. 90 tablet 1   lidocaine (LIDODERM) 5 % PLACE 1 PATCH ONTO THE SKIN DAILY. REMOVE & DISCARD PATCH WITHIN 12 HOURS OR AS DIRECTED BY MD 30 patch 0   meloxicam (MOBIC) 15 MG tablet Take 1 tablet (15 mg total) by mouth daily. 30 tablet 2   methocarbamol (ROBAXIN) 500 MG tablet TAKE 1 TABLET BY MOUTH EVERY 6 HOURS AS NEEDED FOR MUSCLE SPASMS. 60 tablet 1   Multiple Vitamin (MULTI-VITAMINS) TABS Take 1 tablet by mouth daily.      nebivolol (BYSTOLIC) 5 MG tablet Take 1 tablet (5 mg total) by mouth daily. 90 tablet 3   neomycin-polymyxin-hydrocortisone (CORTISPORIN) OTIC solution Place 3 drops into the left ear 4 (four) times daily. 10 mL 0   Nitroglycerin (RECTIV) 0.4 % OINT Place 0.4 inches rectally 2 (two) times daily as needed (For anal fissures.). 30 g 1   ondansetron (ZOFRAN ODT) 4 MG disintegrating tablet Take 1 tablet (4 mg total) by mouth every 8 (eight) hours as needed for nausea or vomiting. 20 tablet 1   ondansetron (ZOFRAN) 8 MG tablet Take 1 tablet (8 mg total) by mouth every 8 (eight) hours as needed for nausea or vomiting. 20 tablet 1   Polyethylene Glycol 3350 (MIRALAX PO) Take 17 g by mouth daily as needed (constipation).      primidone (MYSOLINE) 50 MG tablet TAKE 1 TABLET IN MORNING AND 3 TABLETS AT BEDTIME 120 tablet 0   Travoprost, BAK Free, (TRAVATAN) 0.004 % SOLN ophthalmic solution INSTILL 1 DROP INTO BOTH EYES AT BEDTIME 5 mL 3   vitamin B-12 (CYANOCOBALAMIN) 1000 MCG tablet Take 1 tablet (1,000 mcg total) by mouth daily. 30 tablet 3   zinc gluconate 50 MG tablet Take 50 mg by mouth daily.     No current facility-administered medications on file prior to visit.    ALLERGIES: Allergies  Allergen Reactions   Bee Venom Anaphylaxis   Penicillins Other (See Comments)    Reaction unknown from childhood Has patient had a PCN  reaction causing immediate rash, facial/tongue/throat swelling, SOB or lightheadedness with hypotension: unknown Has patient had a PCN reaction causing severe rash involving mucus membranes or skin necrosis: unknown  Has patient had a PCN reaction that required hospitalization: no Has patient had a PCN reaction occurring within the last 10 years: no If all of the above answers are "NO", then may proceed with Cephalosporin use.    Dextromethorphan Polistirex Er Other (See Comments)    Pt states caused "a lump" in her throat, making it difficult to swallow  Diflucan [Fluconazole]     Primidone interaction   Hydrocodone Nausea And Vomiting   Oxycodone-Acetaminophen Other (See Comments)    Made fingers tingle and go numb, started "feeling weird".    Tape Itching and Other (See Comments)    Bandaids - leaves red marks    FAMILY HISTORY: Family History  Problem Relation Age of Onset   Emphysema Mother        smoker   Allergies Mother    Asthma Mother    Heart disease Mother        AMI as cause of death   COPD Mother    Asthma Father    Heart disease Father        AMI as cause of death   Diabetes Father    Asthma Brother    Heart disease Brother 26       heart failure   Lung cancer Maternal Grandfather        was a smoker   Cancer Maternal Grandfather        lung   Heart disease Paternal Grandmother    Heart disease Paternal Grandfather    Colon cancer Neg Hx       Objective:  Blood pressure (!) 154/83, pulse 73, height 5\' 7"  (1.702 m), weight 163 lb 6.4 oz (74.1 kg), last menstrual period 10/11/2010, SpO2 97 %. General: No acute distress.  Patient appears well-groomed.   Head:  Normocephalic/atraumatic Eyes:  Fundi examined but not visualized Neck: supple, no paraspinal tenderness, full range of motion Heart:  Regular rate and rhythm Lungs:  Clear to auscultation bilaterally Back: No paraspinal tenderness Neurological Exam: alert and oriented to person, place, and  time.  Speech fluent and not dysarthric, language intact.  CN II-XII intact. Bulk and tone normal, difficulty bending left knee, otherwise, muscle strength 5/5 throughout.  Fine postural and kinetic tremor in hands bilaterally.  Sensation to light touch intact.  Deep tendon reflexes 2+ throughout.  Finger to nose testing intact.  Ambulates with cane.   Sharon Clines, DO  CC: Sharon Schooner, MD

## 2020-11-30 ENCOUNTER — Ambulatory Visit (INDEPENDENT_AMBULATORY_CARE_PROVIDER_SITE_OTHER): Payer: PRIVATE HEALTH INSURANCE | Admitting: Neurology

## 2020-11-30 ENCOUNTER — Other Ambulatory Visit: Payer: Self-pay

## 2020-11-30 ENCOUNTER — Encounter: Payer: Self-pay | Admitting: Neurology

## 2020-11-30 VITALS — BP 154/83 | HR 73 | Ht 67.0 in | Wt 163.4 lb

## 2020-11-30 DIAGNOSIS — G250 Essential tremor: Secondary | ICD-10-CM

## 2020-11-30 MED ORDER — PRIMIDONE 50 MG PO TABS: ORAL_TABLET | ORAL | 5 refills | Status: DC

## 2020-11-30 NOTE — Patient Instructions (Signed)
Take primidone 50mg  in morning and 150mg  at night.  We can increase dose if needed

## 2020-12-08 ENCOUNTER — Ambulatory Visit: Payer: PRIVATE HEALTH INSURANCE | Admitting: Physical Medicine and Rehabilitation

## 2020-12-14 ENCOUNTER — Other Ambulatory Visit: Payer: Self-pay

## 2020-12-14 ENCOUNTER — Encounter
Payer: PRIVATE HEALTH INSURANCE | Attending: Physical Medicine and Rehabilitation | Admitting: Physical Medicine and Rehabilitation

## 2020-12-14 VITALS — BP 147/89 | HR 67 | Temp 98.6°F | Ht 67.0 in | Wt 162.6 lb

## 2020-12-14 DIAGNOSIS — M19049 Primary osteoarthritis, unspecified hand: Secondary | ICD-10-CM | POA: Insufficient documentation

## 2020-12-14 DIAGNOSIS — M24662 Ankylosis, left knee: Secondary | ICD-10-CM | POA: Diagnosis present

## 2020-12-14 MED ORDER — MELOXICAM 15 MG PO TABS: 15.0000 mg | ORAL_TABLET | Freq: Every day | ORAL | 2 refills | Status: DC

## 2020-12-14 MED ORDER — GABAPENTIN 600 MG PO TABS: 600.0000 mg | ORAL_TABLET | Freq: Three times a day (TID) | ORAL | 1 refills | Status: DC

## 2020-12-14 MED ORDER — METHOCARBAMOL 500 MG PO TABS: 500.0000 mg | ORAL_TABLET | Freq: Four times a day (QID) | ORAL | 1 refills | Status: DC

## 2020-12-14 NOTE — Patient Instructions (Signed)
Qutenza

## 2020-12-14 NOTE — Progress Notes (Signed)
Subjective:    Patient ID: Sharon [name], female    DOB: Mar 27, 1966, 55 y.o.   MRN: [id number]  HPI  Sharon [name] is a 55 year old woman who presents for ffollow-up of  bilateral knee pain following left knee replacement in 04/2019.   1) Bilateral knee pain L>R: -Her ROM is still restricted in her left knee.  -She asks whether the scar tissue will ever heal.  -She has follow-up with Dr. Mayer Camel and Dr. Ninfa Linden -Currently is not planning to pursue surgery.  -She has been off hydrocodone.  -She would be interested in increasing the Gabapentin dose.  -She would like to discuss an option for inflammation. She has tried aspirations but the fluid comes back right away.  -She got a  Steroid injection and experienced worsening range of motion in her left knee- that was from Dr. Mayer Camel. He also pulled fluid off. It does help with pain, but she does not want another given her worsening range of motion afterward. She does have improved sensation in her knee after the injection! -has been hurting -discussed Qutenza as a treatment option -her current regimen is working for her.   2) Anxiety: This has much improved with the Gabapentin.  3) Impaired balance: -anticipates she will soon have difficulty picking up her granddaughter -she was been watching her granddaughter three days per week  4) low back pain:  -has been worse since she carried her grandchild -she is unable to vacuum.  -discussed therapy and using lidocaine patch, massage.   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  -she has used blue emu oil and diclofenac gel without great benefit -has appointment with physician today.  -she does use her hands a lot.  -she has been dropping things recently.   Prior history:  Since last visit she has stopped Norco as it caused respiratory distress. We tried Cymbalta 40mg  without benefit or  side effects. She has tried voltaren gel, blue emu oil without benefit. She has tried CBD.   Her daughter was hospitalized with COVID pneumonia and is 8 months pregnant.   She still has a fever of 99.3. She took an antibiotic for ear pain and this helped her symptoms. It also helped her breathing.   Current dose of Norco is working well for her. It causes her to have hot flashes but she would still like to continue with this dose.  Discussed Sprint PNS with her again and she still does not like the thought of this option.  She did feel that some of the scar tissue has broken.   Prior history: She has been trying to improve quadriceps musculature. She discusses some of the exercises she does.   She has been denied in her disability. She has talked to an attorney.  She continues to ambulate with a cane.   She has tried the compounding cream and that does not help much. She has been trying Hemp cream which is helping.  She is getting foot insoles from the podiatrist. They should be ready in a week or two.   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   Can ride the stationary bicycle and that does not hurt. She sues it every day. She uses leg weights. When   Scar tissue  grew back quickly post-surgery. She could not get PT appointments for 3 weeks.   Her surgeon does not want her to break the hardware and he may have to do arthroscopic procedure to flush out the inflammation.   She has tried ice which stopped helping, nabumetone 750 mg BID, Norco 1-2 tablets three times per day PRN (she has 12 left). She uses diclofenac gel   She takes Miralax for constipation. She takes up to 2 times per day. She has a history of IBS.   [facility] in [place]- she was a Research scientist (physical sciences). They let her go because she did not return in 12 weeks.   She was doing pool therapy but chemicals in the pool  were increased because of COVID.  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 Notes reviewed- recently had a right subacromial injection.   Current pain is 8/10.    Pain Inventory Average Pain 8 Pain Right Now 6 My pain is intermittent, sharp, burning, stabbing and aching  In the last 24 hours, has pain interfered with the following? General activity 5 Relation with others 4 Enjoyment of life 4 What TIME of day is your pain at its worst?  daytime, evening Sleep (in general) Good  Pain is worse with: walking, bending, sitting, inactivity, standing, and some activites Pain improves with: rest, heat/ice and medication Relief from Meds: 6  Mobility walk with assistance use a cane how many minutes can you walk?  15 mins ability to climb steps?  no do you drive?  yes Do you have any goals in this area?  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  yes  Neuro/Psych tremor trouble walking spasms anxiety  Prior Studies Any changes since last visit?  yes  Physicians involved in your care New Patient   Family History  Problem Relation Age of Onset   Emphysema Mother        smoker   Allergies Mother    Asthma Mother    Heart disease Mother        AMI as cause of death   COPD Mother    Asthma Father    Heart disease Father        AMI as cause of death   Diabetes Father    Asthma Brother    Heart disease Brother 26       heart failure   Lung cancer Maternal Grandfather        was a smoker   Cancer Maternal Grandfather        lung   Heart disease Paternal Grandmother    Heart disease Paternal Grandfather    Colon cancer Neg Hx    Social History   Socioeconomic History   Marital status: Soil scientist    Spouse name: Not on file   Number of children: 1   Years of education: Not  on file   Highest education level: Not on file  Occupational History   Occupation: Restaurant manager, fast food: [employer]  Tobacco Use   Smoking status: Never   Smokeless tobacco: Never  Vaping Use   Vaping Use: Never used  Substance and Sexual Activity   Alcohol use: Not Currently    Comment: occ   Drug use: No    Comment: former maryjuana use-  quit in 1995   Sexual activity: Yes    Birth control/protection: Surgical  Other Topics Concern   Not on file  Social History Narrative   Marital status: divorced x 2; dating seriously x 3 years.        Children:  1 daughter (39); no grandchildren      Lives:  With boyfriend.        Employment: works at [employer]: none      Alcohol: rarely; socially      Exercise: none; has treadmill and elliptical and bikes   Coffee in am / 1/2 of coke in afternoon    High school education      01/29/20   From: the area   Living: alone - but boyfriend living with her Wilfred Lacy (2020) but knew each other in high school   Work: on disability - working on appeal      Family: one daughter [name] - one grandchild coming in January       Enjoys: relax and watch tv, use to like fishing/boating/rafting, reading, crochet       Exercise: rehab exercises   Diet: not great - limited intake      Safety   Seat belts: Yes    Guns: Yes  and secure   Right handed   One story home   Drinks caffeine   Social Determinants of Health   Financial Resource Strain: Not on file  Food Insecurity: Not on file  Transportation Needs: Not on file  Physical Activity: Not on file  Stress: Not on file  Social Connections: Not on file   Past Surgical History:  Procedure Laterality Date   ABDOMINAL HYSTERECTOMY     CATARACT EXTRACTION Bilateral 2015   COLONOSCOPY     CYSTOSCOPY W/ URETERAL STENT PLACEMENT Right 05/23/2017   Procedure: CYSTOSCOPY WITH RETROGRADE PYELOGRAM/URETERAL RIGHT STENT PLACEMENT;  Surgeon: Bjorn Loser,  MD;  Location: [facility];  Service: Urology;  Laterality: Right;   CYSTOSCOPY W/ URETERAL STENT PLACEMENT Right 06/04/2017   Procedure: CYSTOSCOPY WITHRIGHT RETROGRADE PYELOGRAMRIGHT /URETEREOSCOPY  STENT PLACEMENT;  Surgeon: Lucas Mallow, MD;  Location: [facility];  Service: Urology;  Laterality: Right;   ENDOMETRIAL ABLATION     EYE SURGERY Bilateral    cataract surgery with lens implants   HEMORRHOID SURGERY  2011   INCONTINENCE SURGERY     INNER EAR SURGERY     X 2   KNEE SURGERY     RECTAL PROLAPSE REPAIR     RETINAL DETACHMENT SURGERY Bilateral 2000   RETINAL DETACHMENT SURGERY Bilateral    TOTAL KNEE ARTHROPLASTY Left 04/29/2019   TOTAL KNEE ARTHROPLASTY Left 04/29/2019   Procedure: LEFT TOTAL KNEE ARTHROPLASTY;  Surgeon: Mcarthur Rossetti, MD;  Location: [facility];  Service: Orthopedics;  Laterality: Left;   TUBAL LIGATION     VEIN SURGERY Left 04/2010   Past Medical History:  Diagnosis Date   Allergy    Anal fissure    Anemia    borderline   Anxiety    Arthritis    Blood clot in vein    left leg   Detached retina    BOTH SCLERA BUCKLE BOTH EYES   DVT (deep venous thrombosis) (HCC)    left leg   Endometriosis    Family history of adverse reaction to anesthesia    DAUGHTER POST OP PONV   GERD (gastroesophageal reflux disease)    Glaucoma  RIGHT WORST THAN LEFT   Heart murmur    "caused by anxiety"    History of hemorrhoids    History of kidney stones    Hypertension    IBS (irritable bowel syndrome)    Perforated eardrum, right    SMALL HOLE   PONV (postoperative nausea and vomiting)    Pulmonary embolism (HCC) 6 YRS AGO   BP (!) 147/89   Pulse 67   Temp 98.6 F (37 C) (Oral)   Ht 5\' 7"  (1.702 m)   Wt 162 lb 9.6 oz (73.8 kg)   LMP 10/11/2010   SpO2 98%   BMI 25.47 kg/m   Opioid Risk Score:   Fall Risk Score:  `1  Depression screen PHQ 2/9  Depression screen [facility] 2/9 10/07/2020 04/09/2020 01/29/2020 01/23/2020 06/09/2019  01/20/2019 07/22/2018  Decreased Interest 3 0 3 3 0 0 0  Down, Depressed, Hopeless 2 0 2 3 0 0 0  PHQ - 2 Score 5 0 5 6 0 0 0  Altered sleeping 0 - 2 3 - - -  Tired, decreased energy 2 - 2 3 - - -  Change in appetite 3 - 2 3 - - -  Feeling bad or failure about yourself  0 - 0 0 - - -  Trouble concentrating 0 - 1 1 - - -  Moving slowly or fidgety/restless 0 - 0 0 - - -  Suicidal thoughts 0 - 0 0 - - -  PHQ-9 Score 10 - 12 16 - - -  Difficult doing work/chores Somewhat difficult - Very difficult - - - -  Some recent data might be hidden   Review of Systems  Constitutional: Negative.        Weight loss  HENT: Negative.   Eyes: Negative.   Respiratory: Positive for shortness of breath.   Cardiovascular: Negative.   Gastrointestinal: Negative.   Endocrine: Negative.   Genitourinary: Negative.   Musculoskeletal: Positive for arthralgias, back pain and gait problem.       Spasms  Skin: Negative.   Allergic/Immunologic: Negative.   Neurological: Positive for tremors and numbness.  Hematological: Negative.   Psychiatric/Behavioral:       Anxiety  All other systems reviewed and are negative.      Objective:   Physical Exam Gen: no distress, normal appearing HEENT: oral mucosa pink and moist, NCAT Cardio: Reg rate Chest: normal effort, normal rate of breathing Abd: soft, non-distended Ext: no edema Psych: pleasant, normal affect Skin: intact Musculoskeletal: Atrophy of left thigh, swelling and bruising on left knee. Not tight and full currently. Right knee is also tender to palpation. Palable trigger points in her upper and lower back. Neuro: Ambulating with cane with antalgic gait. +Phalen's test bilaterally Psych: pleasant, normal affect, in positive spirits.     Assessment & Plan:  Sharon [name] is a 55 year old woman who presents for follow-up:   1) Left knee pain post-surgery: -She has a plan for repeat surgical procedure with Dr. Ninfa Linden that will hopefully provide  relief.  -She has been icing regularly- recommended three tines per day for 15 minutes.  -She has been through physical therapy and performs her exercises diligently. --Discussed Sprint PNS system as an option of pain treatment via neuromodulation. Provided following link for patient to learn more about the system: https://www.sprtherapeutics.com/. She does not like the feeling of things under her skin and defers that option.  -She weaned herself all the steroids.   -Refilled Robaxin as she  is nervous about feeling too sleepy with the Tizanidine, and the Robaxin does help.   -Refilled Gabapentin to 600mg  TID PRN.   -Continue neoprene knee sleeve for proprioception  -Discussed Qutenza as an option for neuropathic pain control. Discussed that this is a capsaicin patch, stronger than capsaicin cream. Discussed that it is currently approved for diabetic peripheral neuropathy and post-herpetic neuralgia, but that it has also shown benefit in treating other forms of neuropathy. Provided patient with link to site to learn more about the patch: [employer].fr. Discussed that the patch would be placed in office and benefits usually last 3 months. Discussed that unintended exposure to capsaicin can cause severe irritation of eyes, mucous membranes, respiratory tract, and skin, but that Qutenza is a local treatment and does not have the systemic side effects of other nerve medications. Discussed that there may be pain, itching, erythema, and decreased sensory function associated with the application of Qutenza. Side effects usually subside within 1 week. A cold pack of analgesic medications can help with these side effects. Blood pressure can also be increased due to pain associated with administration of the patch.   -She has tried Celebrex and Tramadol without benefit. She had respiratory distress with hydrocodone.   -She had worse pain with steroid injection in her left knee.   -Discussed  steroids but she did not like the side effect profile. Discussed the different steroid options. She weaned off these because she developed mouth sores.   -Continue turmeric and cherries.   -Continue Lidocaine patches today.   -She would like rheum panel checked: ordered, reviewed results which were negative, and discussed with patient.  -Refilled meloxicam.   2) Bunion:  -Referred to podiatry for bunion and orthotic fitting. She will be receiving these in 2 weeks.   3) Irritable bowel syndrome: -She does not eat much sugar or sweets.  -She drinks 1 coke per day.  -She cuts tea out.  -She drinks water, coffee.  -She has been going more regularly and has not needed her Miralax.   4) Impaired Concentration: - This persists -She forgets things easily.  -She tries not to multitask when she was with her grandmother.   5) Anxiety: The Gabapentin is helping with this as well, continue  6) Vulvodynia: -discussed various treatment options: biofeedback, medications, continue estradiol.  -continue Amitriptyline 10mg  HS, discussed moving dose earlier in the evening.   7) Cervical and lumbar myofascial pain: -try massage -discussed benefits of therapy and trigger point injections -apply lidocaine patch.   8) Bilateral hand pain: likely secondary to arthritis compounded by carpal tunnel syndrome -continue Gabapentin which is helping -continue f/u with surgery as planned.

## 2020-12-17 ENCOUNTER — Telehealth: Payer: Self-pay

## 2020-12-17 NOTE — Telephone Encounter (Addendum)
Pt left /vm that she has tested x 2 to confirm + covid result with home test on 12/17/20. Pts symptoms started on 12/16/20. Pt said she is very sick; h/a pain level is 7 -10 and aching all over, fever 101.37 taken with baby monitor; pt taking tylenol 500 mg extra strength taking 2 tabs q 3 h. Pt has dry cough but has rattle in chest. Pt has SOB with cough episode.No diarrhea or vomiting and no loss of taste or smell. Pt has stuff nose, head congestion, and scratchy S/T. Pt exposed to covid with her son and grandchild.pt understands to self quarantine, drink plenty of fluids, rest, tylenol for fever. UC & ED precautions given and pt is going to get dressed and go to [facility] in [place]. Sending note to Dr Einar Pheasant and Gentry Fitz NP.

## 2020-12-17 NOTE — Telephone Encounter (Signed)
Noted  

## 2020-12-21 NOTE — Telephone Encounter (Signed)
Noted, agree with plan for evaluation

## 2020-12-28 IMAGING — DX DG CHEST 2V
2 series · 2 of 2 positions shown · non-contrast
Comparison: 08/17/2017 and prior

CLINICAL DATA: cough

EXAM:
CHEST - 2 VIEW

[chest pa]
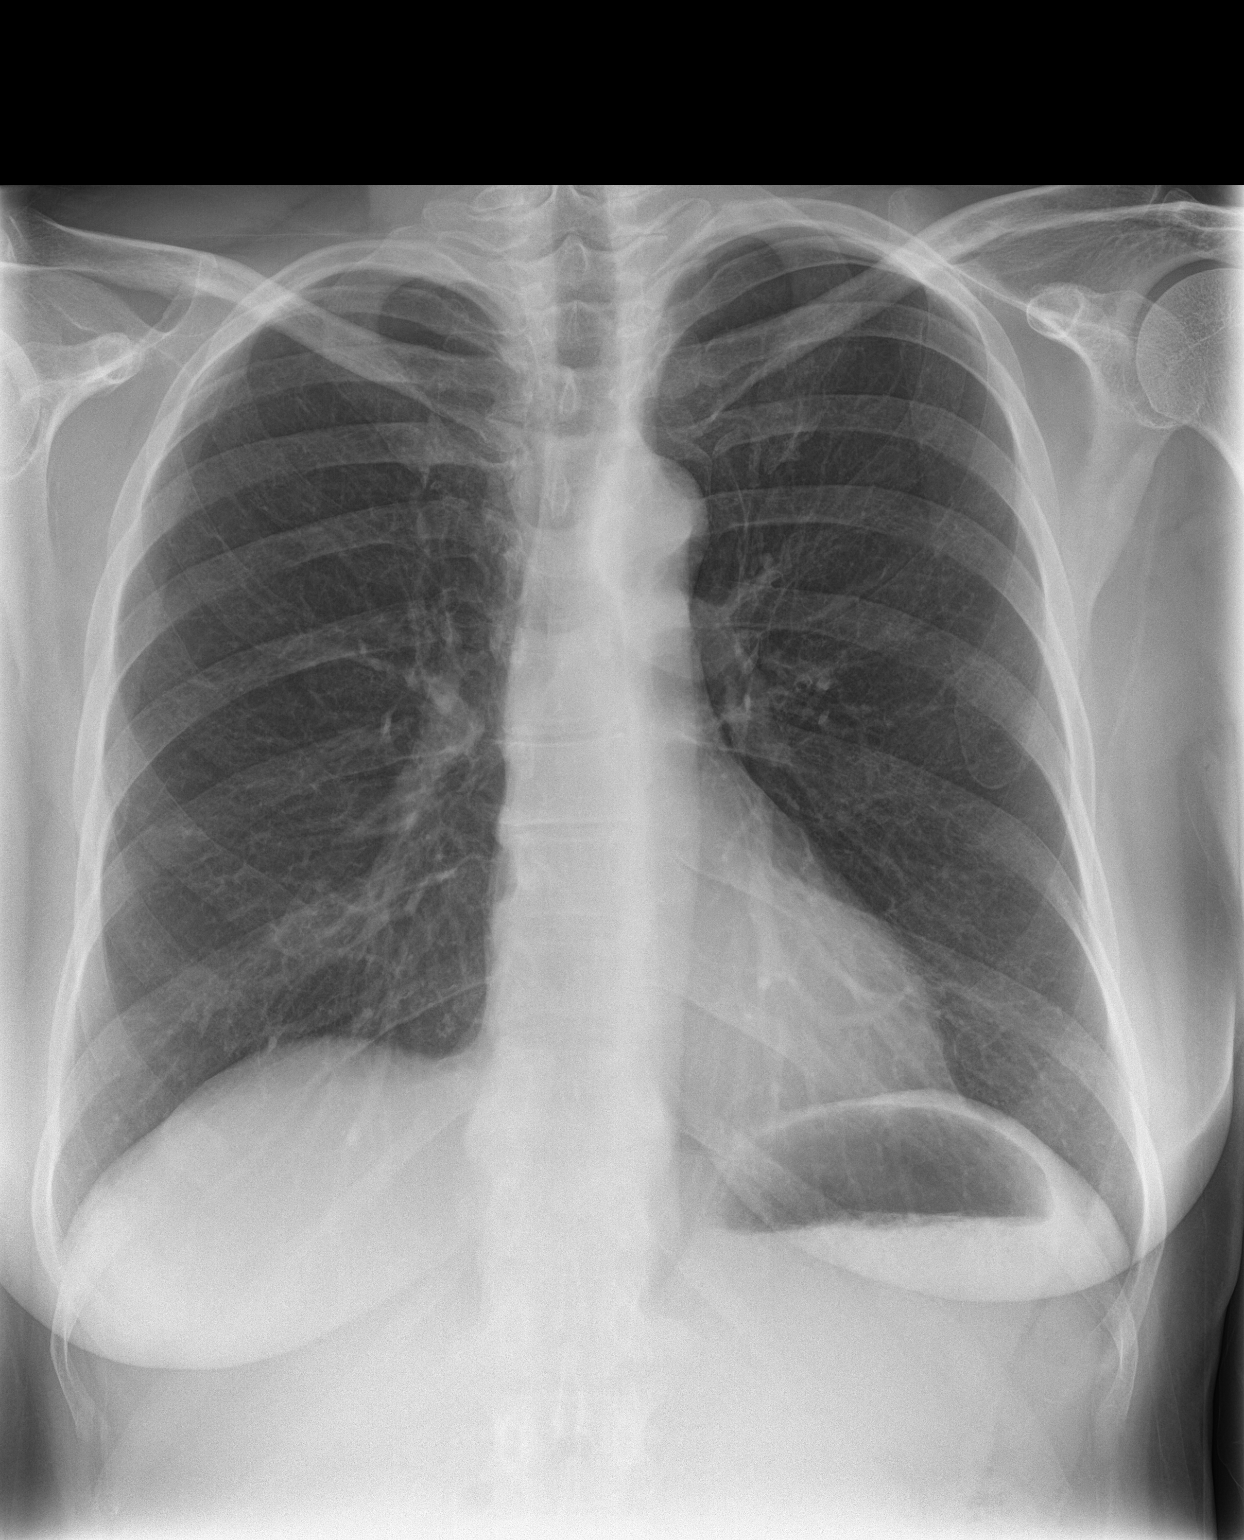

[chest lat]
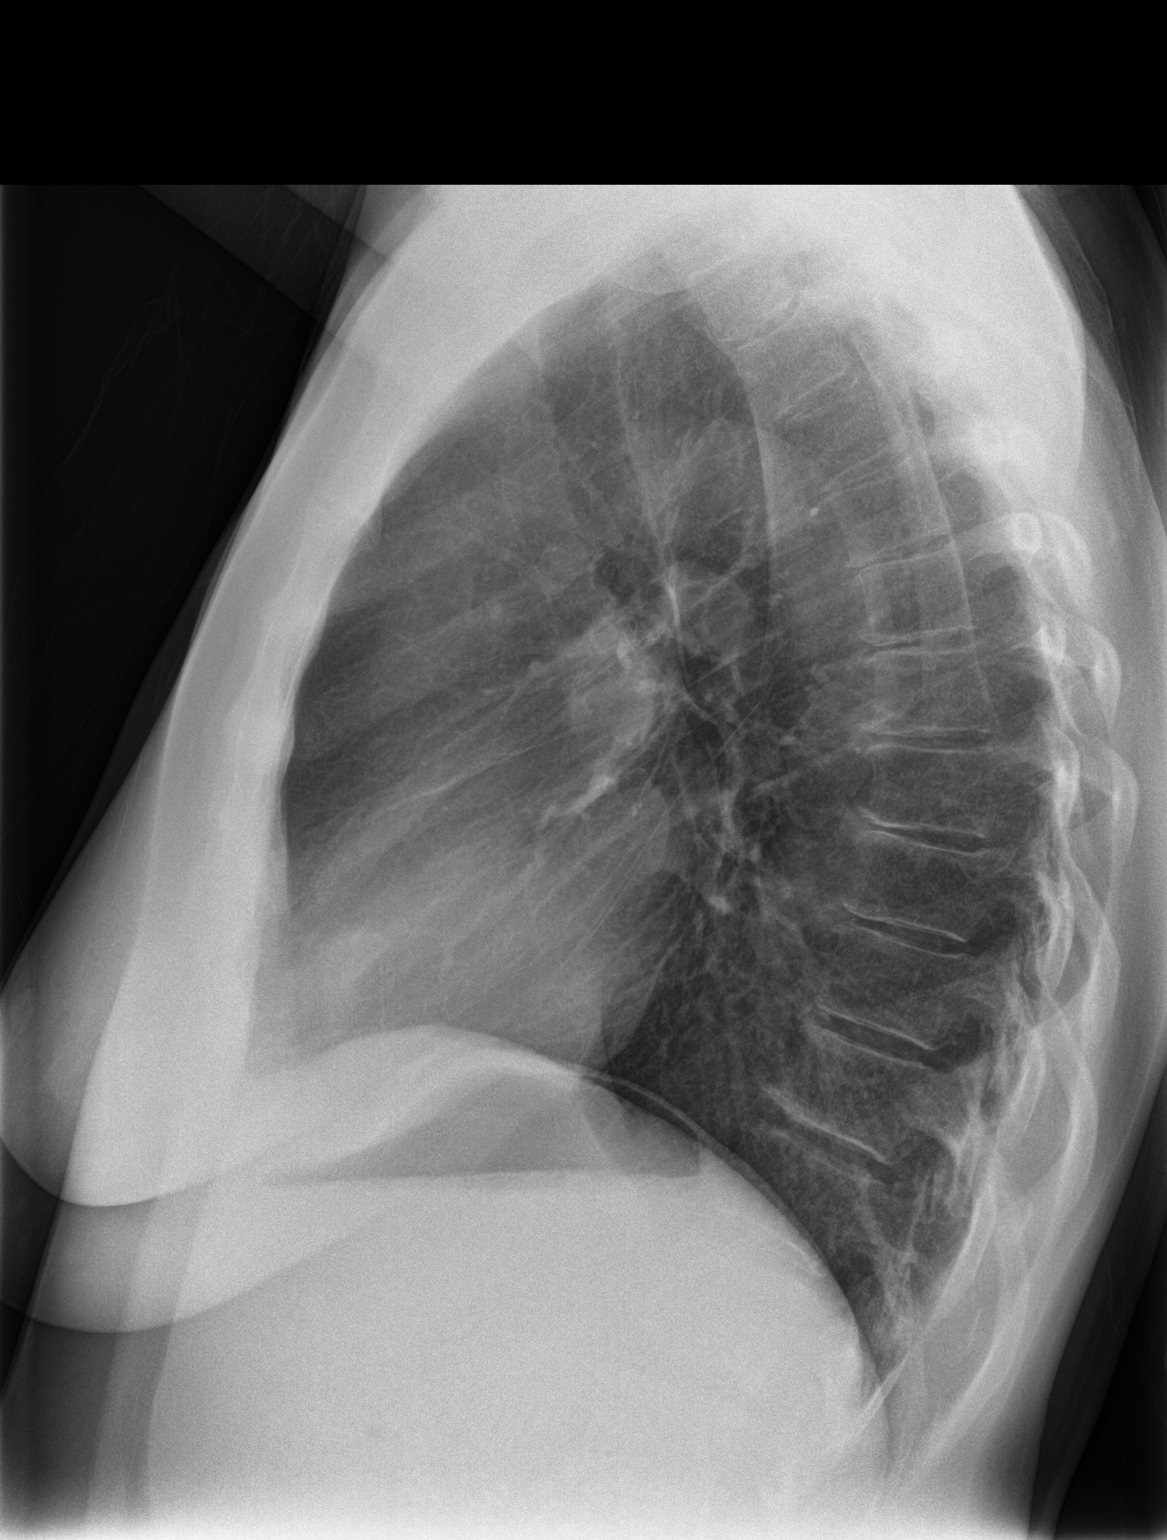

[2 of 2 positions shown; findings below may reference images not displayed]

FINDINGS: No focal consolidation. No pneumothorax or pleural effusion.
Cardiomediastinal silhouette is within normal limits. No acute
osseous abnormality.
IMPRESSION: No focal airspace disease.

## 2021-02-01 DIAGNOSIS — M79676 Pain in unspecified toe(s): Secondary | ICD-10-CM

## 2021-02-07 ENCOUNTER — Other Ambulatory Visit: Payer: Self-pay | Admitting: Family Medicine

## 2021-02-07 DIAGNOSIS — G4700 Insomnia, unspecified: Secondary | ICD-10-CM

## 2021-02-07 DIAGNOSIS — F411 Generalized anxiety disorder: Secondary | ICD-10-CM

## 2021-02-09 DIAGNOSIS — H401131 Primary open-angle glaucoma, bilateral, mild stage: Secondary | ICD-10-CM | POA: Diagnosis not present

## 2021-02-10 ENCOUNTER — Other Ambulatory Visit: Payer: Self-pay | Admitting: Family Medicine

## 2021-02-10 DIAGNOSIS — G4700 Insomnia, unspecified: Secondary | ICD-10-CM

## 2021-02-10 DIAGNOSIS — F411 Generalized anxiety disorder: Secondary | ICD-10-CM

## 2021-02-11 NOTE — Telephone Encounter (Signed)
[place] Night - Client TELEPHONE ADVICE RECORD [facility] Patient Name: Sharon [name] [facility] Gender: Female DOB: 04-06-1966 Age: 55 Y 54 M 10 D Return Phone Number: [phone] (Primary) Address: [place] [address] Client [facility] Night - Client Client Site [place] Physician Waunita Schooner- MD Contact Type Call Who Is Calling Patient / Member / Family / Caregiver Call Type Triage / Clinical Relationship To Patient Self Return Phone Number [phone] (Primary) Chief Complaint Prescription Refill or Medication Request (non symptomatic) Reason for Call Medication Question / Request Initial Comment Caller states she needs a refill for Klonopin. Translation No Nurse Assessment Nurse: Marcelline Deist, RN, Mickel Baas Date/Time Eilene Ghazi Time): 02/10/2021 5:21:01 PM Please select the assessment type ---Request for controlled medication refill Is there an on-call physician for the client? ---Yes Do the client directives specifically allow for paging the on-call regarding scheduled drugs? ---No Additional Documentation ---Caller needs refill on Klonopin Nurse: Marcelline Deist RN, Mickel Baas Date/Time Eilene Ghazi Time): 02/10/2021 5:22:22 PM Confirm and document reason for call. If symptomatic, describe symptoms. ---Caller states she needs a refill for Klonopin. Does the patient have any new or worsening symptoms? ---No Disp. Time Eilene Ghazi Time) Disposition Final User 02/10/2021 5:22:47 PM Clinical Call Yes Marcelline Deist, RN, Elige Radon Disagree/Comply Comply Caller Understands Yes PreDisposition Call Doctor

## 2021-02-11 NOTE — Telephone Encounter (Signed)
I spoke with Sharon [name] at [facility] and pt has klonopin rx ready for pick up and pt has another available refill also. Sharon [name] not sure why refill request sent and please disregard. I spoke with pt and pt is aware klonopin rx is ready; nothing else needed at this time.

## 2021-02-16 ENCOUNTER — Other Ambulatory Visit: Payer: Self-pay | Admitting: Family Medicine

## 2021-02-16 DIAGNOSIS — I10 Essential (primary) hypertension: Secondary | ICD-10-CM

## 2021-02-17 ENCOUNTER — Other Ambulatory Visit: Payer: Self-pay | Admitting: Physical Medicine and Rehabilitation

## 2021-02-17 MED ORDER — METHOCARBAMOL 500 MG PO TABS: 500.0000 mg | ORAL_TABLET | Freq: Four times a day (QID) | ORAL | 1 refills | Status: DC

## 2021-02-17 MED ORDER — GABAPENTIN 600 MG PO TABS: 600.0000 mg | ORAL_TABLET | Freq: Three times a day (TID) | ORAL | 1 refills | Status: DC

## 2021-03-02 DIAGNOSIS — M79642 Pain in left hand: Secondary | ICD-10-CM | POA: Diagnosis not present

## 2021-03-02 DIAGNOSIS — R21 Rash and other nonspecific skin eruption: Secondary | ICD-10-CM | POA: Diagnosis not present

## 2021-03-02 DIAGNOSIS — M533 Sacrococcygeal disorders, not elsewhere classified: Secondary | ICD-10-CM | POA: Diagnosis not present

## 2021-03-02 DIAGNOSIS — M79641 Pain in right hand: Secondary | ICD-10-CM | POA: Diagnosis not present

## 2021-03-05 ENCOUNTER — Other Ambulatory Visit: Payer: Self-pay | Admitting: Physical Medicine and Rehabilitation

## 2021-03-23 ENCOUNTER — Other Ambulatory Visit: Payer: Self-pay | Admitting: Family Medicine

## 2021-03-23 DIAGNOSIS — M533 Sacrococcygeal disorders, not elsewhere classified: Secondary | ICD-10-CM | POA: Diagnosis not present

## 2021-03-23 DIAGNOSIS — K219 Gastro-esophageal reflux disease without esophagitis: Secondary | ICD-10-CM

## 2021-03-23 DIAGNOSIS — M79642 Pain in left hand: Secondary | ICD-10-CM | POA: Diagnosis not present

## 2021-03-23 DIAGNOSIS — M79641 Pain in right hand: Secondary | ICD-10-CM | POA: Diagnosis not present

## 2021-03-23 DIAGNOSIS — R21 Rash and other nonspecific skin eruption: Secondary | ICD-10-CM | POA: Diagnosis not present

## 2021-03-30 DIAGNOSIS — L218 Other seborrheic dermatitis: Secondary | ICD-10-CM | POA: Diagnosis not present

## 2021-03-30 DIAGNOSIS — Z1283 Encounter for screening for malignant neoplasm of skin: Secondary | ICD-10-CM | POA: Diagnosis not present

## 2021-03-30 DIAGNOSIS — D225 Melanocytic nevi of trunk: Secondary | ICD-10-CM | POA: Diagnosis not present

## 2021-03-30 DIAGNOSIS — D1801 Hemangioma of skin and subcutaneous tissue: Secondary | ICD-10-CM | POA: Diagnosis not present

## 2021-04-08 ENCOUNTER — Encounter
Payer: BC Managed Care – PPO | Attending: Physical Medicine and Rehabilitation | Admitting: Physical Medicine and Rehabilitation

## 2021-04-08 ENCOUNTER — Encounter: Payer: Self-pay | Admitting: Physical Medicine and Rehabilitation

## 2021-04-08 ENCOUNTER — Other Ambulatory Visit: Payer: Self-pay

## 2021-04-08 VITALS — BP 132/84 | HR 69 | Ht 67.0 in | Wt 169.4 lb

## 2021-04-08 DIAGNOSIS — M24662 Ankylosis, left knee: Secondary | ICD-10-CM

## 2021-04-08 DIAGNOSIS — M25511 Pain in right shoulder: Secondary | ICD-10-CM | POA: Diagnosis not present

## 2021-04-08 DIAGNOSIS — G8929 Other chronic pain: Secondary | ICD-10-CM

## 2021-04-08 DIAGNOSIS — M545 Low back pain, unspecified: Secondary | ICD-10-CM | POA: Diagnosis not present

## 2021-04-08 MED ORDER — METHOCARBAMOL 500 MG PO TABS: 500.0000 mg | ORAL_TABLET | Freq: Four times a day (QID) | ORAL | 10 refills | Status: DC

## 2021-04-08 NOTE — Addendum Note (Signed)
Addended by: Izora Ribas on: 04/08/2021 03:37 PM   Modules accepted: Orders

## 2021-04-08 NOTE — Progress Notes (Signed)
Subjective:    Patient ID: Sharon [name], female    DOB: 10/08/1965, 55 y.o.   MRN: [id number]  HPI  Sharon [name] is a 55 year old woman who presents for ffollow-up of  bilateral knee pain following left knee replacement in 04/2019.   1) Bilateral knee pain L>R: -knee pain has been stable since last visit -Her ROM is still restricted in her left knee.  -She asks whether the scar tissue will ever heal.  -She has follow-up with Dr. Mayer Camel and Dr. Ninfa Linden -Currently is not planning to pursue surgery.  -She has been off hydrocodone.  -She would be interested in increasing the Gabapentin dose.  -She would like to discuss an option for inflammation. She has tried aspirations but the fluid comes back right away.  -She got a  Steroid injection and experienced worsening range of motion in her left knee- that was from Dr. Mayer Camel. He also pulled fluid off. It does help with pain, but she does not want another given her worsening range of motion afterward. She does have improved sensation in her knee after the injection! -has been hurting -discussed Qutenza as a treatment option -her current regimen is working for her.   2) Anxiety: This has much improved with the Gabapentin.  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  -she has used blue emu oil and diclofenac gel without great benefit -has appointment with physician today.  -she does use her hands a lot.  -she has been dropping things recently.   9) lower back pain -left pain in her boy friend's car so had a hard time getting in today -hurts severely sometimes -pinches when she walks -sitting also hurts -hurt it while bending and turning -has  been worse since she carried her grandchild -she is unable to vacuum.  -discussed therapy and using lidocaine patch, massage.  -she is ok using gabapentin and her muscle relaxers.  -found lidocaine patch to be a lifesaver while she was at the beach.   Prior history:  Since last visit she has stopped Norco as it caused respiratory distress. We tried Cymbalta 40mg  without benefit or side effects. She has tried voltaren gel, blue emu oil without benefit. She has tried CBD.   Her daughter was hospitalized with COVID pneumonia and is 8 months pregnant.   She still has a fever of 99.3. She took an antibiotic for ear pain and this helped her symptoms. It also helped her breathing.   Current dose of Norco is working well for her. It causes her to have hot flashes but she would still like to continue with this dose.  Discussed Sprint PNS with her again and she still does not like the thought of this option.  She did feel that some of the scar tissue has broken.   Prior history: She has been trying to improve quadriceps musculature. She discusses some of the exercises she does.   She has been denied in her disability. She has talked to an attorney.  She continues to ambulate with a cane.   She has tried the compounding cream and that does not help much. She has been trying Hemp cream which is helping.  She is getting foot  insoles from the podiatrist. They should be ready in a week or two.   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   Can ride the stationary bicycle and that does not hurt. She sues it every day. She uses leg weights. When   Scar tissue grew back quickly post-surgery. She could not get PT appointments for 3 weeks.   Her surgeon does not want her to break the hardware and he may have to do arthroscopic procedure to flush out the inflammation.   She has tried ice which  stopped helping, nabumetone 750 mg BID, Norco 1-2 tablets three times per day PRN (she has 12 left). She uses diclofenac gel   She takes Miralax for constipation. She takes up to 2 times per day. She has a history of IBS.   [facility] in [place]- she was a Research scientist (physical sciences). They let her go because she did not return in 12 weeks.   She was doing pool therapy but chemicals in the pool were increased because of COVID.  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 Notes reviewed- recently had a right subacromial injection.   Current pain is 8/10.    Pain Inventory Average Pain 7 Pain Right Now 8 My pain is sharp, burning, dull, stabbing, and aching  In the last 24 hours, has pain interfered with the following? General activity 8 Relation with others 8 Enjoyment of life 9 What TIME of day is your pain at its worst?  daytime, evening Sleep (in general) Good  Pain is worse with: walking, bending, sitting, inactivity, and standing Pain improves with: rest, heat/ice and medication Relief from Meds: 7     Family History  Problem Relation Age of Onset   Emphysema Mother        smoker   Allergies Mother    Asthma Mother    Heart disease Mother        AMI as cause of death   COPD Mother    Asthma Father    Heart disease Father        AMI as cause of death   Diabetes Father    Asthma Brother    Heart disease Brother 11       heart failure   Lung cancer Maternal Grandfather        was a smoker   Cancer Maternal Grandfather        lung   Heart disease Paternal Grandmother    Heart disease Paternal Grandfather    Colon cancer Neg Hx    Social History   Socioeconomic History   Marital status: Soil scientist    Spouse name: Not on file   Number of children: 1   Years of education: Not on file   Highest education level: Not on file  Occupational History   Occupation: Restaurant manager, fast food: [employer]  Tobacco Use   Smoking status:  Never   Smokeless tobacco: Never  Vaping Use   Vaping Use: Never used  Substance and Sexual Activity   Alcohol use: Not Currently    Comment: occ   Drug use: No    Comment: former maryjuana use- quit in 1995   Sexual activity: Yes    Birth control/protection: Surgical  Other Topics Concern   Not on file  Social History Narrative   Marital status:  divorced x 2; dating seriously x 3 years.        Children:  1 daughter (3); no grandchildren      Lives:  With boyfriend.        Employment: works at [employer]: none      Alcohol: rarely; socially      Exercise: none; has treadmill and elliptical and bikes   Coffee in am / 1/2 of coke in afternoon    High school education      01/29/20   From: the area   Living: alone - but boyfriend living with her Wilfred Lacy (2020) but knew each other in high school   Work: on disability - working on appeal      Family: one daughter [name] - one grandchild coming in January       Enjoys: relax and watch tv, use to like fishing/boating/rafting, reading, crochet       Exercise: rehab exercises   Diet: not great - limited intake      Safety   Seat belts: Yes    Guns: Yes  and secure   Right handed   One story home   Drinks caffeine   Social Determinants of Health   Financial Resource Strain: Not on file  Food Insecurity: Not on file  Transportation Needs: Not on file  Physical Activity: Not on file  Stress: Not on file  Social Connections: Not on file   Past Surgical History:  Procedure Laterality Date   ABDOMINAL HYSTERECTOMY     CATARACT EXTRACTION Bilateral 2015   COLONOSCOPY     CYSTOSCOPY W/ URETERAL STENT PLACEMENT Right 05/23/2017   Procedure: CYSTOSCOPY WITH RETROGRADE PYELOGRAM/URETERAL RIGHT STENT PLACEMENT;  Surgeon: Bjorn Loser, MD;  Location: [facility];  Service: Urology;  Laterality: Right;   CYSTOSCOPY W/ URETERAL STENT PLACEMENT Right 06/04/2017   Procedure: CYSTOSCOPY WITHRIGHT RETROGRADE  PYELOGRAMRIGHT /URETEREOSCOPY  STENT PLACEMENT;  Surgeon: Lucas Mallow, MD;  Location: [facility];  Service: Urology;  Laterality: Right;   ENDOMETRIAL ABLATION     EYE SURGERY Bilateral    cataract surgery with lens implants   HEMORRHOID SURGERY  2011   INCONTINENCE SURGERY     INNER EAR SURGERY     X 2   KNEE SURGERY     RECTAL PROLAPSE REPAIR     RETINAL DETACHMENT SURGERY Bilateral 2000   RETINAL DETACHMENT SURGERY Bilateral    TOTAL KNEE ARTHROPLASTY Left 04/29/2019   TOTAL KNEE ARTHROPLASTY Left 04/29/2019   Procedure: LEFT TOTAL KNEE ARTHROPLASTY;  Surgeon: Mcarthur Rossetti, MD;  Location: [facility];  Service: Orthopedics;  Laterality: Left;   TUBAL LIGATION     VEIN SURGERY Left 04/2010   Past Medical History:  Diagnosis Date   Allergy    Anal fissure    Anemia    borderline   Anxiety    Arthritis    Blood clot in vein    left leg   Detached retina    BOTH SCLERA BUCKLE BOTH EYES   DVT (deep venous thrombosis) (HCC)    left leg   Endometriosis    Family history of adverse reaction to anesthesia    DAUGHTER POST OP PONV   GERD (gastroesophageal reflux disease)    Glaucoma    RIGHT WORST THAN LEFT   Heart murmur    "caused by anxiety"    History of hemorrhoids    History of kidney stones    Hypertension  IBS (irritable bowel syndrome)    Perforated eardrum, right    SMALL HOLE   PONV (postoperative nausea and vomiting)    Pulmonary embolism (HCC) 6 YRS AGO   BP 132/84   Pulse 69   Ht 5\' 7"  (1.702 m)   Wt 169 lb 6.4 oz (76.8 kg)   LMP 10/11/2010   SpO2 98%   BMI 26.53 kg/m   Opioid Risk Score:   Fall Risk Score:  `1  Depression screen PHQ 2/9  Depression screen [facility] 2/9 04/08/2021 10/07/2020 04/09/2020 01/29/2020 01/23/2020 06/09/2019 01/20/2019  Decreased Interest 0 3 0 3 3 0 0  Down, Depressed, Hopeless 0 2 0 2 3 0 0  PHQ - 2 Score 0 5 0 5 6 0 0  Altered sleeping - 0 - 2 3 - -  Tired, decreased energy - 2 - 2 3 - -   Change in appetite - 3 - 2 3 - -  Feeling bad or failure about yourself  - 0 - 0 0 - -  Trouble concentrating - 0 - 1 1 - -  Moving slowly or fidgety/restless - 0 - 0 0 - -  Suicidal thoughts - 0 - 0 0 - -  PHQ-9 Score - 10 - 12 16 - -  Difficult doing work/chores - Somewhat difficult - Very difficult - - -  Some recent data might be hidden   Review of Systems  Constitutional: Negative.        Weight loss  HENT: Negative.   Eyes: Negative.   Respiratory: Positive for shortness of breath.   Cardiovascular: Negative.   Gastrointestinal: Negative.   Endocrine: Negative.   Genitourinary: Negative.   Musculoskeletal: Positive for arthralgias, back pain and gait problem.       Spasms  Skin: Negative.   Allergic/Immunologic: Negative.   Neurological: Positive for tremors and numbness.  Hematological: Negative.   Psychiatric/Behavioral:       Anxiety  All other systems reviewed and are negative.      Objective:   Physical Exam Gen: no distress, normal appearing HEENT: oral mucosa pink and moist, NCAT Cardio: Reg rate Chest: normal effort, normal rate of breathing Abd: soft, non-distended Ext: no edema Psych: pleasant, normal affect Skin: intact Musculoskeletal: Atrophy of left thigh, swelling and bruising on left knee. Not tight and full currently. Right knee is also tender to palpation. Palable trigger points in her upper and lower back. +Kemp's test bilaterally.  Neuro: Ambulating with cane with antalgic gait. +Phalen's test bilaterally Psych: pleasant, normal affect, in positive spirits.     Assessment & Plan:  Sharon [name] is a 55 year old woman who presents for follow-up:   1) Left knee pain post-surgery: -She has a plan for repeat surgical procedure with Dr. Ninfa Linden that will hopefully provide relief.  -She has been icing regularly- recommended three tines per day for 15 minutes.  -She has been through physical therapy and performs her exercises  diligently. --Discussed Sprint PNS system as an option of pain treatment via neuromodulation. Provided following link for patient to learn more about the system: https://www.sprtherapeutics.com/. She does not like the feeling of things under her skin and defers that option.  -She weaned herself all the steroids.   -Refilled Robaxin as she is nervous about feeling too sleepy with the Tizanidine, and the Robaxin does help.   -Refilled Gabapentin to 600mg  TID PRN.   -Continue neoprene knee sleeve for proprioception  -Discussed Qutenza as an option for neuropathic pain  control. Discussed that this is a capsaicin patch, stronger than capsaicin cream. Discussed that it is currently approved for diabetic peripheral neuropathy and post-herpetic neuralgia, but that it has also shown benefit in treating other forms of neuropathy. Provided patient with link to site to learn more about the patch: CinemaBonus.fr. Discussed that the patch would be placed in office and benefits usually last 3 months. Discussed that unintended exposure to capsaicin can cause severe irritation of eyes, mucous membranes, respiratory tract, and skin, but that Qutenza is a local treatment and does not have the systemic side effects of other nerve medications. Discussed that there may be pain, itching, erythema, and decreased sensory function associated with the application of Qutenza. Side effects usually subside within 1 week. A cold pack of analgesic medications can help with these side effects. Blood pressure can also be increased due to pain associated with administration of the patch. Discussed that this could help improve her sensation.   -She has tried Celebrex and Tramadol without benefit. She had respiratory distress with hydrocodone.   -She had worse pain with steroid injection in her left knee.   -Discussed steroids but she did not like the side effect profile. Discussed the different steroid options. She weaned off  these because she developed mouth sores.   -Continue turmeric and cherries.   -Continue Lidocaine patches today.   -She would like rheum panel checked: ordered, reviewed results which were negative, and discussed with patient.  -Refilled meloxicam.   2) Bunion:  -Referred to podiatry for bunion and orthotic fitting. She will be receiving these in 2 weeks.   3) Irritable bowel syndrome: -She does not eat much sugar or sweets.  -She drinks 1 coke per day.  -She cuts tea out.  -She drinks water, coffee.  -She has been going more regularly and has not needed her Miralax.   4) Impaired Concentration: - This persists -She forgets things easily.  -She tries not to multitask when she was with her grandmother.   5) Anxiety: The Gabapentin is helping with this as well, continue  6) Vulvodynia: -discussed various treatment options: biofeedback, medications, continue estradiol.  -continue Amitriptyline 10mg  HS, discussed moving dose earlier in the evening.   7) Cervical and lumbar myofascial pain: -try massage -discussed benefits of therapy and trigger point injections -apply lidocaine patch.   8) Bilateral hand pain: likely secondary to arthritis compounded by carpal tunnel syndrome -continue Gabapentin which is helping -continue f/u with surgery as planned.   9) Low back pain, appears to be secondary to face arthritis -discussed XR, but she defers due to potential cost -continue lidocaine patches -continue postural correction -Discussed following foods that may reduce pain: 1) Ginger (especially studied for arthritis)- reduce leukotriene production to decrease inflammation 2) Blueberries- high in phytonutrients that decrease inflammation 3) Salmon- marine omega-3s reduce joint swelling and pain 4) Pumpkin seeds- reduce inflammation 5) dark chocolate- reduces inflammation 6) turmeric- reduces inflammation 7) tart cherries - reduce pain and stiffness 8) extra virgin olive oil  - its compound olecanthal helps to block prostaglandins  9) chili peppers- can be eaten or applied topically via capsaicin 10) mint- helpful for headache, muscle aches, joint pain, and itching 11) garlic- reduces inflammation  Link to further information on diet for chronic pain: http://www.randall.com/   10) shoulder pain -used to get shots in the pt, but laying on her back has helped to decrease her shoulder pain  3:14

## 2021-04-08 NOTE — Patient Instructions (Signed)

## 2021-04-13 ENCOUNTER — Ambulatory Visit (INDEPENDENT_AMBULATORY_CARE_PROVIDER_SITE_OTHER): Payer: BC Managed Care – PPO | Admitting: Family Medicine

## 2021-04-13 ENCOUNTER — Other Ambulatory Visit: Payer: Self-pay

## 2021-04-13 VITALS — BP 110/78 | HR 88 | Temp 97.1°F | Ht 67.5 in | Wt 169.4 lb

## 2021-04-13 DIAGNOSIS — Z1159 Encounter for screening for other viral diseases: Secondary | ICD-10-CM

## 2021-04-13 DIAGNOSIS — I10 Essential (primary) hypertension: Secondary | ICD-10-CM

## 2021-04-13 DIAGNOSIS — R0789 Other chest pain: Secondary | ICD-10-CM

## 2021-04-13 DIAGNOSIS — R102 Pelvic and perineal pain: Secondary | ICD-10-CM

## 2021-04-13 DIAGNOSIS — R053 Chronic cough: Secondary | ICD-10-CM

## 2021-04-13 DIAGNOSIS — J4520 Mild intermittent asthma, uncomplicated: Secondary | ICD-10-CM

## 2021-04-13 DIAGNOSIS — Z0000 Encounter for general adult medical examination without abnormal findings: Secondary | ICD-10-CM

## 2021-04-13 DIAGNOSIS — F411 Generalized anxiety disorder: Secondary | ICD-10-CM

## 2021-04-13 DIAGNOSIS — N816 Rectocele: Secondary | ICD-10-CM

## 2021-04-13 DIAGNOSIS — Z13220 Encounter for screening for lipoid disorders: Secondary | ICD-10-CM | POA: Diagnosis not present

## 2021-04-13 DIAGNOSIS — Z23 Encounter for immunization: Secondary | ICD-10-CM | POA: Diagnosis not present

## 2021-04-13 DIAGNOSIS — G4700 Insomnia, unspecified: Secondary | ICD-10-CM

## 2021-04-13 LAB — COMPREHENSIVE METABOLIC PANEL
ALT: 32 U/L (ref 0–35)
AST: 23 U/L (ref 0–37)
Albumin: 4.6 g/dL (ref 3.5–5.2)
Alkaline Phosphatase: 91 U/L (ref 39–117)
BUN: 15 mg/dL (ref 6–23)
CO2: 31 mEq/L (ref 19–32)
Calcium: 9.8 mg/dL (ref 8.4–10.5)
Chloride: 104 mEq/L (ref 96–112)
Creatinine, Ser: 0.71 mg/dL (ref 0.40–1.20)
GFR: 94.92 mL/min (ref >60.00)
Glucose, Bld: 91 mg/dL (ref 70–99)
Potassium: 4.4 mEq/L (ref 3.5–5.1)
Sodium: 140 mEq/L (ref 135–145)
Total Bilirubin: 0.5 mg/dL (ref 0.2–1.2)
Total Protein: 7.5 g/dL (ref 6.0–8.3)

## 2021-04-13 LAB — LIPID PANEL
Cholesterol: 169 mg/dL (ref 0–200)
HDL: 54.5 mg/dL (ref >39.00)
LDL Cholesterol: 92 mg/dL (ref 0–99)
NonHDL: 114.53
Total CHOL/HDL Ratio: 3
Triglycerides: 113 mg/dL (ref 0.0–149.0)
VLDL: 22.6 mg/dL (ref 0.0–40.0)

## 2021-04-13 MED ORDER — EPINEPHRINE 0.3 MG/0.3ML IJ SOAJ: 0.3000 mg | Freq: Once | INTRAMUSCULAR | 1 refills | Status: DC | PRN

## 2021-04-13 MED ORDER — CLONAZEPAM 0.5 MG PO TABS: 0.5000 mg | ORAL_TABLET | Freq: Every day | ORAL | 5 refills | Status: DC

## 2021-04-13 MED ORDER — ALBUTEROL SULFATE HFA 108 (90 BASE) MCG/ACT IN AERS: 1.0000 | INHALATION_SPRAY | Freq: Four times a day (QID) | RESPIRATORY_TRACT | 2 refills | Status: DC | PRN

## 2021-04-13 NOTE — Assessment & Plan Note (Signed)
Reports painful intercourse from surgical repair with scar tissue. Also with post-menopause vaginal dryness. Encouraged pelvic PT assessment to see if they can help reduce pain

## 2021-04-13 NOTE — Patient Instructions (Addendum)
  Would recommend the following- for bone health:   1) 800 units of Vitamin D daily 2) Get 1200 mg of elemental calcium --- this is best from your diet. Try to track how much calcium you get on a typical day. You could find ways to add more (dairy products, leafy greens). Take a supplement for whatever you don't typically get so you reach 1200 mg of calcium.  3) Physical activity (ideally weight bearing) - like walking briskly 30 minutes 5 days a week.    #Referral - to Pelvic Floor Physical therapy I have placed a referral to a specialist for you. You should receive a phone call from the specialty office. Make sure your voicemail is not full and that if you are able to answer your phone to unknown or new numbers.   It may take up to 2 weeks to hear about the referral. If you do not hear anything in 2 weeks, please call our office and ask to speak with the referral coordinator.

## 2021-04-13 NOTE — Assessment & Plan Note (Signed)
Overall stable. Cont klonopin

## 2021-04-13 NOTE — Progress Notes (Signed)
Annual Exam   Chief Complaint:  Chief Complaint  Patient presents with   Annual Exam    No concerns. I am requesting PAP from [facility]    History of Present Illness:  Ms. Sharon [name] is a 55 y.o. G7P0 who LMP was Patient's last menstrual period was 10/11/2010., presents today for her annual examination.     Nutrition She does not know get adequate calcium and Vitamin D in her diet. Diet: not eating well  Exercise: not currently   Social History   Tobacco Use  Smoking Status Never  Smokeless Tobacco Never     Safety The patient wears seatbelts: yes.     The patient feels safe at home and in their relationships: yes.   Menstrual:  Symptoms of menopause: hot flashes, moodiness S/p hysterectomy  GYN She is not sexually active.    Cervical Cancer Screening (21-65):   Last Pap:  with OB/GYN  Breast Cancer Screening (Age 29-74):  There is no FH of breast cancer. There is no FH of ovarian cancer. BRCA screening Not Indicated.  Last Mammogram: 04/2020 The patient does want a mammogram this year.    Colon Cancer Screening:  Age 44-75 yo - benefits outweigh the risk. Adults 6-85 yo who have never been screened benefit.  Benefits: 134000 people in 2016 will be diagnosed and 49,000 will die - early detection helps [place]: Complications 2/2 to colonoscopy High Risk (Colonoscopy): genetic disorder (Lynch syndrome or familial adenomatous polyposis), personal hx of IBD, previous adenomatous polyp, or previous colorectal cancer, FamHx start 10 years before the age at diagnosis, increased in males and black race  Options:  FIT - looks for hemoglobin (blood in the stool) - specific and fairly sensitive - must be done annually Cologuard - looks for DNA and blood - more sensitive - therefore can have more false positives, every 3 years Colonoscopy - every 10 years if normal - sedation, bowl prep, must have someone drive you  Shared decision making and the patient had decided  to do colonoscopy - due in 2027.   Social History   Tobacco Use  Smoking Status Never  Smokeless Tobacco Never    Lung Cancer Screening (Ages 09-98): not applicable   Weight Wt Readings from Last 3 Encounters:  04/13/21 169 lb 6 oz (76.8 kg)  04/08/21 169 lb 6.4 oz (76.8 kg)  12/14/20 162 lb 9.6 oz (73.8 kg)   Patient has normal BMI  BMI Readings from Last 1 Encounters:  04/13/21 26.14 kg/m     Chronic disease screening Blood pressure monitoring:  BP Readings from Last 3 Encounters:  04/13/21 110/78  04/08/21 132/84  12/14/20 (!) 147/89    Lipid Monitoring: Indication for screening: age >38, obesity, diabetes, family hx, CV risk factors.  Lipid screening: Yes  Lab Results  Component Value Date   CHOL 154 07/04/2018   HDL 58 07/04/2018   [place] 80 07/04/2018   TRIG 81 07/04/2018   CHOLHDL 2.7 07/04/2018     Diabetes Screening: age >79, overweight, family hx, PCOS, hx of gestational diabetes, at risk ethnicity Diabetes Screening screening: Yes  Lab Results  Component Value Date   HGBA1C 5.2 01/01/2018     Past Medical History:  Diagnosis Date   Allergy    Anal fissure    Anemia    borderline   Anxiety    Arthritis    Blood clot in vein    left leg   Detached retina    BOTH SCLERA  BUCKLE BOTH EYES   DVT (deep venous thrombosis) (HCC)    left leg   Endometriosis    Family history of adverse reaction to anesthesia    DAUGHTER POST OP PONV   GERD (gastroesophageal reflux disease)    Glaucoma    RIGHT WORST THAN LEFT   Heart murmur    "caused by anxiety"    History of hemorrhoids    History of kidney stones    Hypertension    IBS (irritable bowel syndrome)    Perforated eardrum, right    SMALL HOLE   PONV (postoperative nausea and vomiting)    Pulmonary embolism ([place]) 6 YRS AGO    Past Surgical History:  Procedure Laterality Date   ABDOMINAL HYSTERECTOMY     CATARACT EXTRACTION Bilateral 2015   COLONOSCOPY     CYSTOSCOPY W/  URETERAL STENT PLACEMENT Right 05/23/2017   Procedure: CYSTOSCOPY WITH RETROGRADE PYELOGRAM/URETERAL RIGHT STENT PLACEMENT;  Surgeon: Bjorn Loser, MD;  Location: [facility];  Service: Urology;  Laterality: Right;   CYSTOSCOPY W/ URETERAL STENT PLACEMENT Right 06/04/2017   Procedure: CYSTOSCOPY WITHRIGHT RETROGRADE PYELOGRAMRIGHT /URETEREOSCOPY  STENT PLACEMENT;  Surgeon: Lucas Mallow, MD;  [facility];  Service: Urology;  Laterality: Right;   ENDOMETRIAL ABLATION     EYE SURGERY Bilateral    cataract surgery with lens implants   HEMORRHOID SURGERY  2011   INCONTINENCE SURGERY     INNER EAR SURGERY     X 2   KNEE SURGERY     RECTAL PROLAPSE REPAIR     RETINAL DETACHMENT SURGERY Bilateral 2000   RETINAL DETACHMENT SURGERY Bilateral    TOTAL KNEE ARTHROPLASTY Left 04/29/2019   TOTAL KNEE ARTHROPLASTY Left 04/29/2019   Procedure: LEFT TOTAL KNEE ARTHROPLASTY;  Surgeon: Mcarthur Rossetti, MD;  Location: [facility];  Service: Orthopedics;  Laterality: Left;   TUBAL LIGATION     VEIN SURGERY Left 04/2010    Prior to Admission medications   Medication Sig Start Date End Date Taking? Authorizing Provider  acetaminophen (TYLENOL) 500 MG tablet Take 1,000 mg by mouth every 8 (eight) hours as needed for moderate pain.    Yes [provider]  acyclovir (ZOVIRAX) 400 MG tablet Take 1 tablet (400 mg total) by mouth at bedtime. 01/01/18  Yes Stallings, Zoe A, MD  albuterol (VENTOLIN HFA) 108 (90 Base) MCG/ACT inhaler INHALE 2 PUFFS BY MOUTH EVERY 6 HOURS AS NEEDED FOR WHEEZE OR SHORTNESS OF BREATH 08/17/20  Yes Lesleigh Noe, MD  amitriptyline (ELAVIL) 10 MG tablet TAKE 1 TABLET BY MOUTH EVERYDAY AT BEDTIME 03/08/21  Yes Raulkar, Clide Deutscher, MD  Cholecalciferol (VITAMIN D) 2000 UNITS tablet Take 2,000 Units by mouth daily.   Yes [provider]  clonazePAM (KLONOPIN) 0.5 MG tablet Take 1 tablet (0.5 mg total) by mouth at bedtime. 10/07/20  Yes Lesleigh Noe, MD  EPINEPHrine 0.3 mg/0.3 mL IJ SOAJ injection Inject 0.3 mg into the muscle once as needed (For anaphylaxis.). 08/10/20  Yes Raulkar, Clide Deutscher, MD  estradiol (ESTRACE) 0.1 MG/GM vaginal cream Insert pea-sized amount nightly for 2 weeks then every other night 09/10/19  Yes [provider]  famotidine (PEPCID) 20 MG tablet TAKE 1 TABLET BY MOUTH TWICE A DAY 03/23/21  Yes Lesleigh Noe, MD  lidocaine (LIDODERM) 5 % PLACE 1 PATCH ONTO THE SKIN DAILY. REMOVE & DISCARD PATCH WITHIN 12 HOURS OR AS DIRECTED BY MD 07/14/20  Yes Raulkar, Clide Deutscher, MD  meloxicam (  MOBIC) 15 MG tablet Take 1 tablet (15 mg total) by mouth daily. 12/14/20 12/14/21 Yes Raulkar, Clide Deutscher, MD  methocarbamol (ROBAXIN) 500 MG tablet Take 1 tablet (500 mg total) by mouth 4 (four) times daily. 04/08/21  Yes Raulkar, Clide Deutscher, MD  Multiple Vitamin (MULTI-VITAMINS) TABS Take 1 tablet by mouth daily.    Yes [provider]  nebivolol (BYSTOLIC) 5 MG tablet TAKE 1 TABLET BY MOUTH EVERY DAY 02/18/21  Yes Lesleigh Noe, MD  neomycin-polymyxin-hydrocortisone (CORTISPORIN) OTIC solution Place 3 drops into the left ear 4 (four) times daily. 11/08/20  Yes Lesleigh Noe, MD  Nitroglycerin (RECTIV) 0.4 % OINT Place 0.4 inches rectally 2 (two) times daily as needed (For anal fissures.). 01/01/18  Yes Stallings, Zoe A, MD  ondansetron (ZOFRAN ODT) 4 MG disintegrating tablet Take 1 tablet (4 mg total) by mouth every 8 (eight) hours as needed for nausea or vomiting. 05/01/19  Yes Mcarthur Rossetti, MD  ondansetron (ZOFRAN) 8 MG tablet Take 1 tablet (8 mg total) by mouth every 8 (eight) hours as needed for nausea or vomiting. 05/05/19  Yes Mcarthur Rossetti, MD  Polyethylene Glycol 3350 (MIRALAX PO) Take 17 g by mouth daily as needed (constipation).    Yes [provider]  primidone (MYSOLINE) 50 MG tablet TAKE 1 TABLET IN MORNING AND 3 TABLETS AT BEDTIME 11/30/20  Yes Jaffe, Adam R, DO  Travoprost, BAK Free,  (TRAVATAN) 0.004 % SOLN ophthalmic solution INSTILL 1 DROP INTO BOTH EYES AT BEDTIME 07/09/19  Yes Stallings, Zoe A, MD  vitamin B-12 (CYANOCOBALAMIN) 1000 MCG tablet Take 1 tablet (1,000 mcg total) by mouth daily. 01/20/19  Yes Delia Chimes A, MD  zinc gluconate 50 MG tablet Take 50 mg by mouth daily.   Yes [provider]  gabapentin (NEURONTIN) 600 MG tablet Take 1 tablet (600 mg total) by mouth 3 (three) times daily. 02/17/21 04/08/21  Izora Ribas, MD  primidone (MYSOLINE) 50 MG tablet TAKE 1 TABLET IN MORNING AND 3 TABLETS AT BEDTIME 11/16/20   Pieter Partridge, DO    Allergies  Allergen Reactions   Bee Venom Anaphylaxis   Penicillins Other (See Comments)    Reaction unknown from childhood Has patient had a PCN reaction causing immediate rash, facial/tongue/throat swelling, SOB or lightheadedness with hypotension: unknown Has patient had a PCN reaction causing severe rash involving mucus membranes or skin necrosis: unknown  Has patient had a PCN reaction that required hospitalization: no Has patient had a PCN reaction occurring within the last 10 years: no If all of the above answers are "NO", then may proceed with Cephalosporin use.    Dextromethorphan Polistirex Er Other (See Comments)    Pt states caused "a lump" in her throat, making it difficult to swallow   Diflucan [Fluconazole]     Primidone interaction   Hydrocodone Nausea And Vomiting   Oxycodone-Acetaminophen Other (See Comments)    Made fingers tingle and go numb, started "feeling weird".    Tape Itching and Other (See Comments)    Bandaids - leaves red marks    Gynecologic History: Patient's last menstrual period was 10/11/2010.  Obstetric History: G7P0  Social History   Socioeconomic History   Marital status: Soil scientist    Spouse name: Not on file   Number of children: 1   Years of education: Not on file   Highest education level: Not on file  Occupational History   Occupation: Community education officer: [employer]  Tobacco Use   Smoking status: Never   Smokeless tobacco: Never  Vaping Use   Vaping Use: Never used  Substance and Sexual Activity   Alcohol use: Not Currently    Comment: occ   Drug use: No    Comment: former maryjuana use- quit in 1995   Sexual activity: Yes    Birth control/protection: Surgical  Other Topics Concern   Not on file  Social History Narrative   Marital status: divorced x 2; dating seriously x 3 years.        Children:  1 daughter (40); no grandchildren      Lives:  With boyfriend.        Employment: works at [employer]: none      Alcohol: rarely; socially      Exercise: none; has treadmill and elliptical and bikes   Coffee in am / 1/2 of coke in afternoon    High school education      01/29/20   From: the area   Living: alone - but boyfriend living with her Wilfred Lacy (2020) but knew each other in high school   Work: on disability - working on appeal      Family: one daughter [name] - one grandchild coming in January       Enjoys: relax and watch tv, use to like fishing/boating/rafting, reading, crochet       Exercise: rehab exercises   Diet: not great - limited intake      Safety   Seat belts: Yes    Guns: Yes  and secure   Right handed   One story home   Drinks caffeine   Social Determinants of Health   Financial Resource Strain: Not on file  Food Insecurity: Not on file  Transportation Needs: Not on file  Physical Activity: Not on file  Stress: Not on file  Social Connections: Not on file  Intimate Partner Violence: Not on file    Family History  Problem Relation Age of Onset   Emphysema Mother        smoker   Allergies Mother    Asthma Mother    Heart disease Mother        AMI as cause of death   COPD Mother    Asthma Father    Heart disease Father        AMI as cause of death   Diabetes Father    Asthma Brother    Heart disease Brother 32       heart failure    Lung cancer Maternal Grandfather        was a smoker   Cancer Maternal Grandfather        lung   Heart disease Paternal Grandmother    Heart disease Paternal Grandfather    Colon cancer Neg Hx     Review of Systems  Constitutional:  Negative for chills and fever.  HENT:  Negative for congestion and sore throat.   Eyes:  Negative for blurred vision and double vision.  Respiratory:  Negative for shortness of breath.   Cardiovascular:  Negative for chest pain.  Gastrointestinal:  Negative for heartburn, nausea and vomiting.  Genitourinary: Negative.        Pain w/ intercourse  Musculoskeletal: Negative.  Negative for myalgias.  Skin:  Negative for rash.  Neurological:  Negative for dizziness and headaches.  Endo/Heme/Allergies:  Does not bruise/bleed easily.  Psychiatric/Behavioral:  Positive for  depression. The patient is nervous/anxious.     Physical Exam BP 110/78   Pulse 88   Temp (!) 97.1 F (36.2 C) (Temporal)   Ht 5' 7.5" (1.715 m)   Wt 169 lb 6 oz (76.8 kg)   LMP 10/11/2010   SpO2 98%   BMI 26.14 kg/m    BP Readings from Last 3 Encounters:  04/13/21 110/78  04/08/21 132/84  12/14/20 (!) 147/89      Physical Exam Constitutional:      General: She is not in acute distress.    Appearance: She is well-developed. She is not diaphoretic.  HENT:     Head: Normocephalic and atraumatic.     Right Ear: External ear normal.     Left Ear: External ear normal.     Nose: Nose normal.  Eyes:     General: No scleral icterus.    Extraocular Movements: Extraocular movements intact.     Conjunctiva/sclera: Conjunctivae normal.  Cardiovascular:     Rate and Rhythm: Normal rate and regular rhythm.     Heart sounds: No murmur heard. Pulmonary:     Effort: Pulmonary effort is normal. No respiratory distress.     Breath sounds: Normal breath sounds. No wheezing.  Abdominal:     General: Bowel sounds are normal. There is no distension.     Palpations: Abdomen is soft.  There is no mass.     Tenderness: There is no abdominal tenderness. There is no guarding or rebound.  Musculoskeletal:        General: Normal range of motion.     Cervical back: Neck supple.  Lymphadenopathy:     Cervical: No cervical adenopathy.  Skin:    General: Skin is warm and dry.     Capillary Refill: Capillary refill takes less than 2 seconds.  Neurological:     Mental Status: She is alert and oriented to person, place, and time.     Deep Tendon Reflexes: Reflexes normal.  Psychiatric:        Mood and Affect: Mood normal.        Behavior: Behavior normal.    Results:  PHQ-9:  [place] Office Visit from 04/13/2021 in [place] [facility] [place]  PHQ-9 Total Score 10         Assessment: 55 y.o. G58P0 female here for routine annual physical examination.  Plan: Problem List Items Addressed This Visit       Cardiovascular and Mediastinum   Hypertension   Relevant Medications   EPINEPHrine 0.3 mg/0.3 mL IJ SOAJ injection   Other Relevant Orders   Comprehensive metabolic panel     Respiratory   Reactive airway disease     Digestive   Rectocele    Reports painful intercourse from surgical repair with scar tissue. Also with post-menopause vaginal dryness. Encouraged pelvic PT assessment to see if they can help reduce pain      Relevant Orders   Ambulatory referral to Physical Therapy     Other   GAD (generalized anxiety disorder)    Overall stable. Cont klonopin      Relevant Medications   clonazePAM (KLONOPIN) 0.5 MG tablet   Insomnia   Relevant Medications   clonazePAM (KLONOPIN) 0.5 MG tablet   Persistent cough for 3 weeks or longer   Relevant Medications   albuterol (VENTOLIN HFA) 108 (90 Base) MCG/ACT inhaler   Other Visit Diagnoses     Annual physical exam    -  Primary   Chest  tightness       Relevant Medications   albuterol (VENTOLIN HFA) 108 (90 Base) MCG/ACT inhaler   Pelvic pain in female       Relevant Orders    Ambulatory referral to Physical Therapy   Screening for hyperlipidemia       Relevant Orders   Lipid panel   Need for hepatitis C screening test       Relevant Orders   Hepatitis C antibody   Need for influenza vaccination       Relevant Orders   Flu Vaccine QUAD 31moIM (Fluarix, Fluzone & Alfiuria Quad PF) (Completed)       Screening: -- Blood pressure screen normal -- cholesterol screening: will obtain -- Weight screening: overweight: continue to monitor -- Diabetes Screening: will obtain -- Nutrition: Encouraged healthy diet  The 10-year ASCVD risk score (Arnett DK, et al., 2019) is: 1.6%   Values used to calculate the score:     Age: [age]years     Sex: Female     Is Non-Hispanic African American: No     Diabetic: No     Tobacco smoker: No     Systolic Blood Pressure: 1157mmHg     Is BP treated: Yes     HDL Cholesterol: 58 mg/dL     Total Cholesterol: 154 mg/dL  -- Statin therapy for Age 56-75with CVD risk >7.5%  Psych -- Depression screening (PHQ-9):  [place] Total Score 10      - pt reports she is overall doing well  Safety -- tobacco screening: not using -- alcohol screening:  low-risk usage. -- no evidence of domestic violence or intimate partner violence.   Cancer Screening -- pap smear not collected per ASCCP guidelines -- family history of breast cancer screening: done. not at high risk. -- Mammogram -  pt will call to schedule -- Colon cancer (age 56+--  up to date  Immunizations Immunization History  Administered Date(s) Administered   Influenza Whole 03/19/2010   Influenza,inj,Quad PF,6+ Mos 03/25/2015, 04/13/2021   Influenza-Unspecified 03/19/2018   Moderna Sars-Covid-2 Vaccination 06/21/2019, 07/19/2019, 08/06/2020   Tdap 11/08/2016    -- flu vaccine up to date -- TDAP q10 years up to date -- Shingles (age >>54 not up to date - will get in spring  --  Covid-19 Vaccine up to date   Encouraged healthy diet and exercise. Encouraged regular vision and dental care.    JLesleigh Noe MD

## 2021-04-14 LAB — HEPATITIS C ANTIBODY
Hepatitis C Ab: NONREACTIVE
SIGNAL TO CUT-OFF: 0 (ref <1.00)

## 2021-04-22 ENCOUNTER — Encounter (HOSPITAL_COMMUNITY): Payer: Self-pay | Admitting: Physician Assistant

## 2021-04-22 ENCOUNTER — Other Ambulatory Visit: Payer: Self-pay

## 2021-04-22 ENCOUNTER — Ambulatory Visit (INDEPENDENT_AMBULATORY_CARE_PROVIDER_SITE_OTHER): Payer: BC Managed Care – PPO

## 2021-04-22 ENCOUNTER — Ambulatory Visit (HOSPITAL_COMMUNITY)
Admission: EM | Admit: 2021-04-22 | Discharge: 2021-04-22 | Disposition: A | Discharge status: Home / Self Care | Payer: BC Managed Care – PPO | Attending: Internal Medicine | Admitting: Internal Medicine

## 2021-04-22 DIAGNOSIS — M545 Low back pain, unspecified: Secondary | ICD-10-CM | POA: Diagnosis not present

## 2021-04-22 DIAGNOSIS — J329 Chronic sinusitis, unspecified: Secondary | ICD-10-CM | POA: Diagnosis not present

## 2021-04-22 DIAGNOSIS — R051 Acute cough: Secondary | ICD-10-CM | POA: Insufficient documentation

## 2021-04-22 DIAGNOSIS — R509 Fever, unspecified: Secondary | ICD-10-CM

## 2021-04-22 DIAGNOSIS — J40 Bronchitis, not specified as acute or chronic: Secondary | ICD-10-CM | POA: Insufficient documentation

## 2021-04-22 DIAGNOSIS — R059 Cough, unspecified: Secondary | ICD-10-CM | POA: Diagnosis not present

## 2021-04-22 LAB — POCT URINALYSIS DIPSTICK, ED / UC
Bilirubin Urine: NEGATIVE
Glucose, UA: NEGATIVE mg/dL
Ketones, ur: NEGATIVE mg/dL
Leukocytes,Ua: NEGATIVE
Nitrite: NEGATIVE
Protein, ur: NEGATIVE mg/dL
Specific Gravity, Urine: <=1.005 (ref 1.005–1.030)
Urobilinogen, UA: 0.2 mg/dL (ref 0.0–1.0)
pH: 5.5 (ref 5.0–8.0)

## 2021-04-22 MED ORDER — ACETAMINOPHEN 325 MG PO TABS
650.0000 mg | ORAL_TABLET | Freq: Once | ORAL | Status: AC
  Administered 2021-04-22: 650 mg via ORAL

## 2021-04-22 MED ORDER — DOXYCYCLINE HYCLATE 100 MG PO CAPS: 100.0000 mg | ORAL_CAPSULE | Freq: Two times a day (BID) | ORAL | 0 refills | Status: DC

## 2021-04-22 MED ORDER — ACETAMINOPHEN 325 MG PO TABS
ORAL_TABLET | ORAL | Status: AC
  Filled 2021-04-22: qty 2

## 2021-04-22 NOTE — ED Triage Notes (Signed)
Pt presents with congestion and cough that began last week. Pt states she also has lower back pain. Pt states she also has redness around a mole that was removed two weeks ago

## 2021-04-22 NOTE — ED Provider Notes (Signed)
[place]    CSN: [id number] Arrival date & time: 04/22/21  1102      History   Chief Complaint Chief Complaint  Patient presents with   Cough   Nasal Congestion   Back Pain    HPI Sharon [name] is a 55 y.o. female.   Patient presents today with a weeklong history of URI symptoms including cough, body aches, nasal congestion.  She reports mucus is thick and worse in the morning when she first wakes up.  She has tried over-the-counter medications without improvement of symptoms.  Reports a history of reactive airway and has used albuterol inhaler with improvement of symptoms.  She denies any significant shortness of breath.  She does have a history of PE that was provoked several years ago and is not currently on anticoagulation.  She does have a history of back pain but reports this is increased recently.  She denies any urinary symptoms but is interested in having her urine checked to make sure that this is not contributing to symptoms.  Denies any known sick contacts.  Denies any recent antibiotic use.  Denies any chest pain, palpitations, shortness of breath.   Past Medical History:  Diagnosis Date   Allergy    Anal fissure    Anemia    borderline   Anxiety    Arthritis    Blood clot in vein    left leg   Detached retina    BOTH SCLERA BUCKLE BOTH EYES   DVT (deep venous thrombosis) (HCC)    left leg   Endometriosis    Family history of adverse reaction to anesthesia    DAUGHTER POST OP PONV   GERD (gastroesophageal reflux disease)    Glaucoma    RIGHT WORST THAN LEFT   Heart murmur    "caused by anxiety"    History of hemorrhoids    History of kidney stones    Hypertension    IBS (irritable bowel syndrome)    Perforated eardrum, right    SMALL HOLE   PONV (postoperative nausea and vomiting)    Pulmonary embolism ([place]) 6 YRS AGO    Patient Active Problem List   Diagnosis Date Noted   Reactive airway disease 10/07/2020   Arthrofibrosis  of knee joint, left 07/26/2020   History of left knee replacement 07/26/2020   Right otitis media 06/25/2020   Persistent cough for 3 weeks or longer 06/07/2020   Persistent fever 06/07/2020   Nail abnormality 06/07/2020   Gastroesophageal reflux disease 04/13/2020   Chronic prescription benzodiazepine use 04/13/2020   Insomnia 02/03/2020   Hx of herpes genitalis 01/29/2020   Pain in female genitalia on intercourse 01/29/2020   Status post total left knee replacement 04/29/2019   Impingement syndrome of right shoulder 10/28/2018   Chronic pain of left knee 03/27/2018   Chronic right shoulder pain 03/27/2018   Cervicalgia 03/27/2018   Exposure of implanted vaginal mesh ([place]) 03/06/2018   Outlet dysfunction constipation 03/06/2018   Status post arthroscopy of left knee 08/06/2017   Unilateral primary osteoarthritis, left knee 08/06/2017   Tremor 07/10/2017   Other meniscus derangements, posterior horn of medial meniscus, left knee 06/28/2017   Ureteral stone 05/23/2017   Acute pain of left knee 04/09/2017   Trochanteric bursitis, right hip 09/27/2016   GAD (generalized anxiety disorder) 05/31/2014   Rectocele 08/21/2011   Uterovaginal prolapse 08/21/2011   Dyspnea 10/13/2010   Hypertension 10/13/2010    Past Surgical History:  Procedure Laterality Date  ABDOMINAL HYSTERECTOMY     CATARACT EXTRACTION Bilateral 2015   COLONOSCOPY     CYSTOSCOPY W/ URETERAL STENT PLACEMENT Right 05/23/2017   Procedure: CYSTOSCOPY WITH RETROGRADE PYELOGRAM/URETERAL RIGHT STENT PLACEMENT;  Surgeon: Bjorn Loser, MD;  Location: [facility];  Service: Urology;  Laterality: Right;   CYSTOSCOPY W/ URETERAL STENT PLACEMENT Right 06/04/2017   Procedure: CYSTOSCOPY WITHRIGHT RETROGRADE PYELOGRAMRIGHT /URETEREOSCOPY  STENT PLACEMENT;  Surgeon: Lucas Mallow, MD;  Location: [facility];  Service: Urology;  Laterality: Right;   ENDOMETRIAL ABLATION     EYE SURGERY Bilateral    cataract  surgery with lens implants   HEMORRHOID SURGERY  2011   INCONTINENCE SURGERY     INNER EAR SURGERY     X 2   KNEE SURGERY     RECTAL PROLAPSE REPAIR     RETINAL DETACHMENT SURGERY Bilateral 2000   RETINAL DETACHMENT SURGERY Bilateral    TOTAL KNEE ARTHROPLASTY Left 04/29/2019   TOTAL KNEE ARTHROPLASTY Left 04/29/2019   Procedure: LEFT TOTAL KNEE ARTHROPLASTY;  Surgeon: Mcarthur Rossetti, MD;  Location: [facility];  Service: Orthopedics;  Laterality: Left;   TUBAL LIGATION     VEIN SURGERY Left 04/2010    OB History     Gravida  7   Para      Term      Preterm      AB      Living  1      SAB      IAB      Ectopic      Multiple      Live Births               Home Medications    Prior to Admission medications   Medication Sig Start Date End Date Taking? Authorizing Provider  doxycycline (VIBRAMYCIN) 100 MG capsule Take 1 capsule (100 mg total) by mouth 2 (two) times daily. 04/22/21  Yes Tamario Heal, Derry Skill, PA-C  acetaminophen (TYLENOL) 500 MG tablet Take 1,000 mg by mouth every 8 (eight) hours as needed for moderate pain.     [provider]  acyclovir (ZOVIRAX) 400 MG tablet Take 1 tablet (400 mg total) by mouth at bedtime. 01/01/18   Forrest Moron, MD  albuterol (VENTOLIN HFA) 108 (90 Base) MCG/ACT inhaler Inhale 1-2 puffs into the lungs every 6 (six) hours as needed for wheezing or shortness of breath. 04/13/21   Lesleigh Noe, MD  amitriptyline (ELAVIL) 10 MG tablet TAKE 1 TABLET BY MOUTH EVERYDAY AT BEDTIME 03/08/21   Raulkar, Clide Deutscher, MD  Cholecalciferol (VITAMIN D) 2000 UNITS tablet Take 2,000 Units by mouth daily.    [provider]  clonazePAM (KLONOPIN) 0.5 MG tablet Take 1 tablet (0.5 mg total) by mouth at bedtime. 04/13/21   Lesleigh Noe, MD  EPINEPHrine 0.3 mg/0.3 mL IJ SOAJ injection Inject 0.3 mg into the muscle once as needed (For anaphylaxis.). 04/13/21   Lesleigh Noe, MD  estradiol (ESTRACE) 0.1 MG/GM vaginal cream  Insert pea-sized amount nightly for 2 weeks then every other night 09/10/19   [provider]  famotidine (PEPCID) 20 MG tablet TAKE 1 TABLET BY MOUTH TWICE A DAY 03/23/21   Lesleigh Noe, MD  lidocaine (LIDODERM) 5 % PLACE 1 PATCH ONTO THE SKIN DAILY. REMOVE & DISCARD PATCH WITHIN 12 HOURS OR AS DIRECTED BY MD 07/14/20   Izora Ribas, MD  meloxicam (MOBIC) 15 MG tablet Take 1 tablet (15 mg total)  by mouth daily. 12/14/20 12/14/21  Raulkar, Clide Deutscher, MD  methocarbamol (ROBAXIN) 500 MG tablet Take 1 tablet (500 mg total) by mouth 4 (four) times daily. 04/08/21   Raulkar, Clide Deutscher, MD  Multiple Vitamin (MULTI-VITAMINS) TABS Take 1 tablet by mouth daily.     [provider]  nebivolol (BYSTOLIC) 5 MG tablet TAKE 1 TABLET BY MOUTH EVERY DAY 02/18/21   Lesleigh Noe, MD  neomycin-polymyxin-hydrocortisone (CORTISPORIN) OTIC solution Place 3 drops into the left ear 4 (four) times daily. 11/08/20   Lesleigh Noe, MD  Nitroglycerin (RECTIV) 0.4 % OINT Place 0.4 inches rectally 2 (two) times daily as needed (For anal fissures.). 01/01/18   Forrest Moron, MD  ondansetron (ZOFRAN ODT) 4 MG disintegrating tablet Take 1 tablet (4 mg total) by mouth every 8 (eight) hours as needed for nausea or vomiting. 05/01/19   Mcarthur Rossetti, MD  ondansetron (ZOFRAN) 8 MG tablet Take 1 tablet (8 mg total) by mouth every 8 (eight) hours as needed for nausea or vomiting. 05/05/19   Mcarthur Rossetti, MD  Polyethylene Glycol 3350 (MIRALAX PO) Take 17 g by mouth daily as needed (constipation).     [provider]  primidone (MYSOLINE) 50 MG tablet TAKE 1 TABLET IN MORNING AND 3 TABLETS AT BEDTIME 11/30/20   Jaffe, Adam R, DO  Travoprost, BAK Free, (TRAVATAN) 0.004 % SOLN ophthalmic solution INSTILL 1 DROP INTO BOTH EYES AT BEDTIME 07/09/19   Forrest Moron, MD  vitamin B-12 (CYANOCOBALAMIN) 1000 MCG tablet Take 1 tablet (1,000 mcg total) by mouth daily. 01/20/19   Forrest Moron, MD   zinc gluconate 50 MG tablet Take 50 mg by mouth daily.    [provider]  gabapentin (NEURONTIN) 600 MG tablet Take 1 tablet (600 mg total) by mouth 3 (three) times daily. 02/17/21 04/08/21  Izora Ribas, MD  primidone (MYSOLINE) 50 MG tablet TAKE 1 TABLET IN MORNING AND 3 TABLETS AT BEDTIME 11/16/20   Pieter Partridge, DO    Family History Family History  Problem Relation Age of Onset   Emphysema Mother        smoker   Allergies Mother    Asthma Mother    Heart disease Mother        AMI as cause of death   COPD Mother    Asthma Father    Heart disease Father        AMI as cause of death   Diabetes Father    Asthma Brother    Heart disease Brother 88       heart failure   Lung cancer Maternal Grandfather        was a smoker   Cancer Maternal Grandfather        lung   Heart disease Paternal Grandmother    Heart disease Paternal Grandfather    Colon cancer Neg Hx     Social History Social History   Tobacco Use   Smoking status: Never   Smokeless tobacco: Never  Vaping Use   Vaping Use: Never used  Substance Use Topics   Alcohol use: Not Currently    Comment: occ   Drug use: No    Comment: former maryjuana use- quit in 1995     Allergies   Bee venom, Penicillins, Dextromethorphan polistirex er, Diflucan [fluconazole], Hydrocodone, Oxycodone-acetaminophen, and Tape   Review of Systems Review of Systems  Constitutional:  Positive for activity change, fatigue and fever. Negative for appetite change.  HENT:  Positive for congestion. Negative for sinus pressure, sneezing and sore throat.   Respiratory:  Positive for cough. Negative for shortness of breath.   Cardiovascular:  Negative for chest pain.  Gastrointestinal:  Negative for abdominal pain, diarrhea, nausea and vomiting.  Genitourinary:  Negative for dysuria, frequency and urgency.  Musculoskeletal:  Positive for back pain. Negative for arthralgias and myalgias.  Neurological:  Negative for  dizziness, light-headedness and headaches.    Physical Exam Triage Vital Signs ED Triage Vitals  Enc Vitals Group     BP 04/22/21 1224 (!) 145/84     Pulse Rate 04/22/21 1224 82     Resp 04/22/21 1224 14     Temp 04/22/21 1224 (!) 100.5 F (38.1 C)     Temp Source 04/22/21 1224 Oral     SpO2 04/22/21 1224 96 %     Weight --      Height --      Head Circumference --      Peak Flow --      Pain Score 04/22/21 1225 5     Pain Loc --      Pain Edu? --      Excl. in [place]? --    No data found.  Updated Vital Signs BP (!) 145/84 (BP Location: Right Arm)   Pulse 82   Temp (!) 100.5 F (38.1 C) (Oral)   Resp 14   LMP 10/11/2010   SpO2 96%   Visual Acuity Right Eye Distance:   Left Eye Distance:   Bilateral Distance:    Right Eye Near:   Left Eye Near:    Bilateral Near:     Physical Exam Vitals reviewed.  Constitutional:      General: She is awake. She is not in acute distress.    Appearance: Normal appearance. She is well-developed. She is not ill-appearing.     Comments: Very pleasant female appears stated age no acute distress sitting comfortably in exam room  HENT:     Head: Normocephalic and atraumatic.     Right Ear: Tympanic membrane, ear canal and external ear normal. Tympanic membrane is not erythematous or bulging.     Left Ear: Tympanic membrane, ear canal and external ear normal. Tympanic membrane is not erythematous or bulging.     Nose:     Right Sinus: No maxillary sinus tenderness or frontal sinus tenderness.     Left Sinus: No maxillary sinus tenderness or frontal sinus tenderness.     Mouth/Throat:     Pharynx: Uvula midline. Posterior oropharyngeal erythema present. No oropharyngeal exudate.  Cardiovascular:     Rate and Rhythm: Normal rate and regular rhythm.     Heart sounds: Normal heart sounds, S1 normal and S2 normal. No murmur heard. Pulmonary:     Effort: Pulmonary effort is normal.     Breath sounds: Rhonchi present. No wheezing or rales.      Comments: Decreased aeration bilateral bases with scattered rhonchi Abdominal:     General: Bowel sounds are normal.     Palpations: Abdomen is soft.     Tenderness: There is no right CVA tenderness, left CVA tenderness, guarding or rebound.  Psychiatric:        Behavior: Behavior is cooperative.     UC Treatments / Results  Labs (all labs ordered are listed, but only abnormal results are displayed) Labs Reviewed  POCT URINALYSIS DIPSTICK, ED / UC - Abnormal; Notable for the following components:  Result Value   Hgb urine dipstick MODERATE (*)    All other components within normal limits  URINE CULTURE    EKG   Radiology DG Chest 2 View  Result Date: 04/22/2021 CLINICAL DATA:  Cough. EXAM: CHEST - 2 VIEW COMPARISON:  June 07, 2020. FINDINGS: The heart size and mediastinal contours are within normal limits. Both lungs are clear. The visualized skeletal structures are unremarkable. IMPRESSION: No active cardiopulmonary disease. Electronically Signed   By: Marijo Conception M.D.   On: 04/22/2021 13:09    Procedures Procedures (including critical care time)  Medications Ordered in UC Medications  acetaminophen (TYLENOL) tablet 650 mg (650 mg Oral Given 04/22/21 1231)    Initial Impression / Assessment and Plan / UC Course  I have reviewed the triage vital signs and the nursing notes.  Pertinent labs & imaging results that were available during my care of the patient were reviewed by me and considered in my medical decision making (see chart for details).     No indication for viral testing given patient has been symptomatic for a week with recent sudden worsening of symptoms.  Chest x-ray was obtained given rhonchi which showed no acute abnormality.  Patient was started on doxycycline 100 mg twice daily for 10 days given penicillin allergy.  She is unable to take prednisone due to history of glaucoma.  Recommended she use albuterol as needed for shortness of  breath/coughing fits.  She is to use Mucinex, Tylenol, Flonase for additional symptom relief.  Discussed at length alarm symptoms that warrant emergent evaluation.  Strict return precautions given to which she expressed understanding.  She is to follow-up with primary care provider within a week to ensure symptom improvement.  Final Clinical Impressions(s) / UC Diagnoses   Final diagnoses:  Sinobronchitis  Acute cough     Discharge Instructions      Your chest x-ray was reassuring with no evidence of pneumonia.  Please start antibiotics given your worsening cough symptoms.  Take doxycycline 100 mg twice daily for 10 days.  This can upset your stomach so take it with food.  I recommend you use Mucinex, Tylenol, Flonase for additional symptom relief.  Make sure you are resting and drinking plenty of fluid.  If you have any worsening symptoms including chest pain, shortness of breath, high fever not responding to medication, nausea, vomiting you need to go to the emergency room.  Follow-up with your primary care provider within a week to ensure symptom improvement.     ED Prescriptions     Medication Sig Dispense Auth. Provider   doxycycline (VIBRAMYCIN) 100 MG capsule Take 1 capsule (100 mg total) by mouth 2 (two) times daily. 20 capsule Ulla Mckiernan, Derry Skill, PA-C      PDMP not reviewed this encounter.   Terrilee Croak, PA-C 04/22/21 1329

## 2021-04-22 NOTE — Discharge Instructions (Signed)
Your chest x-ray was reassuring with no evidence of pneumonia.  Please start antibiotics given your worsening cough symptoms.  Take doxycycline 100 mg twice daily for 10 days.  This can upset your stomach so take it with food.  I recommend you use Mucinex, Tylenol, Flonase for additional symptom relief.  Make sure you are resting and drinking plenty of fluid.  If you have any worsening symptoms including chest pain, shortness of breath, high fever not responding to medication, nausea, vomiting you need to go to the emergency room.  Follow-up with your primary care provider within a week to ensure symptom improvement.

## 2021-04-23 LAB — URINE CULTURE: Culture: NO GROWTH

## 2021-04-27 DIAGNOSIS — N941 Unspecified dyspareunia: Secondary | ICD-10-CM | POA: Diagnosis not present

## 2021-04-27 DIAGNOSIS — N952 Postmenopausal atrophic vaginitis: Secondary | ICD-10-CM | POA: Diagnosis not present

## 2021-04-27 DIAGNOSIS — Z01419 Encounter for gynecological examination (general) (routine) without abnormal findings: Secondary | ICD-10-CM | POA: Diagnosis not present

## 2021-04-27 DIAGNOSIS — R319 Hematuria, unspecified: Secondary | ICD-10-CM | POA: Diagnosis not present

## 2021-05-04 DIAGNOSIS — R3915 Urgency of urination: Secondary | ICD-10-CM | POA: Diagnosis not present

## 2021-05-04 DIAGNOSIS — R300 Dysuria: Secondary | ICD-10-CM | POA: Diagnosis not present

## 2021-05-04 DIAGNOSIS — R8271 Bacteriuria: Secondary | ICD-10-CM | POA: Diagnosis not present

## 2021-05-04 DIAGNOSIS — N952 Postmenopausal atrophic vaginitis: Secondary | ICD-10-CM | POA: Diagnosis not present

## 2021-05-04 DIAGNOSIS — R3914 Feeling of incomplete bladder emptying: Secondary | ICD-10-CM | POA: Diagnosis not present

## 2021-05-18 DIAGNOSIS — M545 Low back pain, unspecified: Secondary | ICD-10-CM | POA: Diagnosis not present

## 2021-05-18 DIAGNOSIS — M62838 Other muscle spasm: Secondary | ICD-10-CM | POA: Diagnosis not present

## 2021-05-18 DIAGNOSIS — N9411 Superficial (introital) dyspareunia: Secondary | ICD-10-CM | POA: Diagnosis not present

## 2021-05-25 ENCOUNTER — Other Ambulatory Visit: Payer: Self-pay | Admitting: Physical Medicine and Rehabilitation

## 2021-05-25 MED ORDER — METHOCARBAMOL 500 MG PO TABS: 500.0000 mg | ORAL_TABLET | Freq: Four times a day (QID) | ORAL | 10 refills | Status: DC

## 2021-05-25 MED ORDER — GABAPENTIN 600 MG PO TABS: 600.0000 mg | ORAL_TABLET | Freq: Three times a day (TID) | ORAL | 11 refills | Status: DC

## 2021-05-25 MED ORDER — MELOXICAM 15 MG PO TABS: 15.0000 mg | ORAL_TABLET | Freq: Every day | ORAL | 2 refills | Status: DC

## 2021-05-27 ENCOUNTER — Other Ambulatory Visit: Payer: Self-pay | Admitting: Obstetrics and Gynecology

## 2021-05-27 DIAGNOSIS — Z1231 Encounter for screening mammogram for malignant neoplasm of breast: Secondary | ICD-10-CM

## 2021-06-01 DIAGNOSIS — N9411 Superficial (introital) dyspareunia: Secondary | ICD-10-CM | POA: Diagnosis not present

## 2021-06-01 DIAGNOSIS — M545 Low back pain, unspecified: Secondary | ICD-10-CM | POA: Diagnosis not present

## 2021-06-01 DIAGNOSIS — M62838 Other muscle spasm: Secondary | ICD-10-CM | POA: Diagnosis not present

## 2021-06-08 DIAGNOSIS — M62838 Other muscle spasm: Secondary | ICD-10-CM | POA: Diagnosis not present

## 2021-06-08 DIAGNOSIS — N9411 Superficial (introital) dyspareunia: Secondary | ICD-10-CM | POA: Diagnosis not present

## 2021-06-08 DIAGNOSIS — M545 Low back pain, unspecified: Secondary | ICD-10-CM | POA: Diagnosis not present

## 2021-06-15 DIAGNOSIS — H401131 Primary open-angle glaucoma, bilateral, mild stage: Secondary | ICD-10-CM | POA: Diagnosis not present

## 2021-06-17 ENCOUNTER — Other Ambulatory Visit: Payer: Self-pay | Admitting: Neurology

## 2021-06-17 DIAGNOSIS — N9411 Superficial (introital) dyspareunia: Secondary | ICD-10-CM | POA: Diagnosis not present

## 2021-06-17 DIAGNOSIS — M62838 Other muscle spasm: Secondary | ICD-10-CM | POA: Diagnosis not present

## 2021-06-17 DIAGNOSIS — K5900 Constipation, unspecified: Secondary | ICD-10-CM | POA: Diagnosis not present

## 2021-06-17 DIAGNOSIS — M6281 Muscle weakness (generalized): Secondary | ICD-10-CM | POA: Diagnosis not present

## 2021-06-29 ENCOUNTER — Ambulatory Visit
Admission: RE | Admit: 2021-06-29 | Discharge: 2021-06-29 | Disposition: A | Discharge status: Home / Self Care | Payer: BC Managed Care – PPO | Source: Ambulatory Visit | Attending: Obstetrics and Gynecology | Admitting: Obstetrics and Gynecology

## 2021-06-29 DIAGNOSIS — Z1231 Encounter for screening mammogram for malignant neoplasm of breast: Secondary | ICD-10-CM

## 2021-07-01 DIAGNOSIS — S0502XA Injury of conjunctiva and corneal abrasion without foreign body, left eye, initial encounter: Secondary | ICD-10-CM | POA: Diagnosis not present

## 2021-07-05 ENCOUNTER — Other Ambulatory Visit: Payer: Self-pay | Admitting: Family Medicine

## 2021-07-05 DIAGNOSIS — S0502XD Injury of conjunctiva and corneal abrasion without foreign body, left eye, subsequent encounter: Secondary | ICD-10-CM | POA: Diagnosis not present

## 2021-07-05 DIAGNOSIS — R0789 Other chest pain: Secondary | ICD-10-CM

## 2021-07-05 DIAGNOSIS — R053 Chronic cough: Secondary | ICD-10-CM

## 2021-07-06 DIAGNOSIS — R3914 Feeling of incomplete bladder emptying: Secondary | ICD-10-CM | POA: Diagnosis not present

## 2021-07-06 DIAGNOSIS — M62838 Other muscle spasm: Secondary | ICD-10-CM | POA: Diagnosis not present

## 2021-07-06 DIAGNOSIS — M6281 Muscle weakness (generalized): Secondary | ICD-10-CM | POA: Diagnosis not present

## 2021-07-06 DIAGNOSIS — K5900 Constipation, unspecified: Secondary | ICD-10-CM | POA: Diagnosis not present

## 2021-07-07 ENCOUNTER — Other Ambulatory Visit: Payer: Self-pay | Admitting: Physical Medicine and Rehabilitation

## 2021-07-07 MED ORDER — GABAPENTIN 600 MG PO TABS: 600.0000 mg | ORAL_TABLET | Freq: Three times a day (TID) | ORAL | 11 refills | Status: DC

## 2021-07-13 DIAGNOSIS — H0014 Chalazion left upper eyelid: Secondary | ICD-10-CM | POA: Diagnosis not present

## 2021-07-13 DIAGNOSIS — M62838 Other muscle spasm: Secondary | ICD-10-CM | POA: Diagnosis not present

## 2021-07-13 DIAGNOSIS — N9411 Superficial (introital) dyspareunia: Secondary | ICD-10-CM | POA: Diagnosis not present

## 2021-07-13 DIAGNOSIS — M6281 Muscle weakness (generalized): Secondary | ICD-10-CM | POA: Diagnosis not present

## 2021-07-22 DIAGNOSIS — K5900 Constipation, unspecified: Secondary | ICD-10-CM | POA: Diagnosis not present

## 2021-07-22 DIAGNOSIS — M62838 Other muscle spasm: Secondary | ICD-10-CM | POA: Diagnosis not present

## 2021-07-22 DIAGNOSIS — M6281 Muscle weakness (generalized): Secondary | ICD-10-CM | POA: Diagnosis not present

## 2021-07-22 DIAGNOSIS — R3914 Feeling of incomplete bladder emptying: Secondary | ICD-10-CM | POA: Diagnosis not present

## 2021-07-25 ENCOUNTER — Other Ambulatory Visit: Payer: Self-pay

## 2021-07-25 ENCOUNTER — Ambulatory Visit (INDEPENDENT_AMBULATORY_CARE_PROVIDER_SITE_OTHER): Payer: BC Managed Care – PPO | Admitting: Family

## 2021-07-25 VITALS — BP 118/72 | HR 70 | Temp 97.0°F | Wt 171.1 lb

## 2021-07-25 DIAGNOSIS — R350 Frequency of micturition: Secondary | ICD-10-CM | POA: Diagnosis not present

## 2021-07-25 DIAGNOSIS — R21 Rash and other nonspecific skin eruption: Secondary | ICD-10-CM

## 2021-07-25 MED ORDER — VALACYCLOVIR HCL 1 G PO TABS: 1000.0000 mg | ORAL_TABLET | Freq: Three times a day (TID) | ORAL | 0 refills | Status: AC

## 2021-07-25 NOTE — Progress Notes (Signed)
Established Patient Office Visit  Subjective:  Patient ID: Sharon [name], female    DOB: 02-25-1966  Age: 56 y.o. MRN: [id number]  CC:  Chief Complaint  Patient presents with   Back Pain    And burning in lumbar area x 2 weeks    Rash    Left of thoracic spine since yesterday     HPI [name] is here today with concerns.   Burning/tingling in lower back that started about two weeks ago, when it initially started was more of a back ache. She does have a pinched nerve which she states is not typical of this. Increasing soreness over the next week or so, did apply heating pad as well with no real relief. Ice was used instead and felt better. aIso then yesterday she noticed that she was starting with a red rash on the left upper mid back. They are itchy. No discharge form the site as of yet.   Past Medical History:  Diagnosis Date   Allergy    Anal fissure    Anemia    borderline   Anxiety    Arthritis    Blood clot in vein    left leg   Detached retina    BOTH SCLERA BUCKLE BOTH EYES   DVT (deep venous thrombosis) (HCC)    left leg   Endometriosis    Family history of adverse reaction to anesthesia    DAUGHTER POST OP PONV   GERD (gastroesophageal reflux disease)    Glaucoma    RIGHT WORST THAN LEFT   Heart murmur    "caused by anxiety"    History of hemorrhoids    History of kidney stones    Hypertension    IBS (irritable bowel syndrome)    Perforated eardrum, right    SMALL HOLE   PONV (postoperative nausea and vomiting)    Pulmonary embolism ([place]) 6 YRS AGO    Past Surgical History:  Procedure Laterality Date   ABDOMINAL HYSTERECTOMY     CATARACT EXTRACTION Bilateral 2015   COLONOSCOPY     CYSTOSCOPY W/ URETERAL STENT PLACEMENT Right 05/23/2017   Procedure: CYSTOSCOPY WITH RETROGRADE PYELOGRAM/URETERAL RIGHT STENT PLACEMENT;  Surgeon: Bjorn Loser, MD;  Location: [facility];  Service: Urology;  Laterality: Right;   CYSTOSCOPY W/ URETERAL  STENT PLACEMENT Right 06/04/2017   Procedure: CYSTOSCOPY WITHRIGHT RETROGRADE PYELOGRAMRIGHT /URETEREOSCOPY  STENT PLACEMENT;  Surgeon: Lucas Mallow, MD;  Location: [facility];  Service: Urology;  Laterality: Right;   ENDOMETRIAL ABLATION     EYE SURGERY Bilateral    cataract surgery with lens implants   HEMORRHOID SURGERY  2011   INCONTINENCE SURGERY     INNER EAR SURGERY     X 2   KNEE SURGERY     RECTAL PROLAPSE REPAIR     RETINAL DETACHMENT SURGERY Bilateral 2000   RETINAL DETACHMENT SURGERY Bilateral    TOTAL KNEE ARTHROPLASTY Left 04/29/2019   TOTAL KNEE ARTHROPLASTY Left 04/29/2019   Procedure: LEFT TOTAL KNEE ARTHROPLASTY;  Surgeon: Mcarthur Rossetti, MD;  Location: [facility];  Service: Orthopedics;  Laterality: Left;   TUBAL LIGATION     VEIN SURGERY Left 04/2010    Family History  Problem Relation Age of Onset   Emphysema Mother        smoker   Allergies Mother    Asthma Mother    Heart disease Mother        AMI as cause of death  COPD Mother    Asthma Father    Heart disease Father        AMI as cause of death   Diabetes Father    Asthma Brother    Heart disease Brother 52       heart failure   Lung cancer Maternal Grandfather        was a smoker   Cancer Maternal Grandfather        lung   Heart disease Paternal Grandmother    Heart disease Paternal Grandfather    Colon cancer Neg Hx     Social History   Socioeconomic History   Marital status: Divorced    Spouse name: Not on file   Number of children: 1   Years of education: Not on file   Highest education level: Not on file  Occupational History   Occupation: Restaurant manager, fast food: [employer]  Tobacco Use   Smoking status: Never   Smokeless tobacco: Never  Vaping Use   Vaping Use: Never used  Substance and Sexual Activity   Alcohol use: Not Currently    Comment: occ   Drug use: No    Comment: former maryjuana use- quit in 1995   Sexual  activity: Yes    Birth control/protection: Surgical  Other Topics Concern   Not on file  Social History Narrative   Marital status: divorced x 2; dating seriously x 3 years.        Children:  1 daughter (84); no grandchildren      Lives:  With boyfriend.        Employment: works at [employer]: none      Alcohol: rarely; socially      Exercise: none; has treadmill and elliptical and bikes   Coffee in am / 1/2 of coke in afternoon    High school education      01/29/20   From: the area   Living: alone - but boyfriend living with her Wilfred Lacy (2020) but knew each other in high school   Work: on disability - working on appeal      Family: one daughter [name] - one grandchild coming in January       Enjoys: relax and watch tv, use to like fishing/boating/rafting, reading, crochet       Exercise: rehab exercises   Diet: not great - limited intake      Safety   Seat belts: Yes    Guns: Yes  and secure   Right handed   One story home   Drinks caffeine   Social Determinants of Health   Financial Resource Strain: Not on file  Food Insecurity: Not on file  Transportation Needs: Not on file  Physical Activity: Not on file  Stress: Not on file  Social Connections: Not on file  Intimate Partner Violence: Not on file    Outpatient Medications Prior to Visit  Medication Sig Dispense Refill   acetaminophen (TYLENOL) 500 MG tablet Take 1,000 mg by mouth every 8 (eight) hours as needed for moderate pain.      acyclovir (ZOVIRAX) 400 MG tablet Take 1 tablet (400 mg total) by mouth at bedtime. (Patient taking differently: Take 400 mg by mouth as needed.) 90 tablet 3   albuterol (VENTOLIN HFA) 108 (90 Base) MCG/ACT inhaler INHALE 1-2 PUFFS BY MOUTH EVERY 6 HOURS AS NEEDED FOR WHEEZE OR SHORTNESS OF BREATH 8.5 each 2   amitriptyline (ELAVIL)  10 MG tablet TAKE 1 TABLET BY MOUTH EVERYDAY AT BEDTIME 90 tablet 1   Cholecalciferol (VITAMIN D) 2000 UNITS tablet Take 2,000 Units by  mouth daily.     clonazePAM (KLONOPIN) 0.5 MG tablet Take 1 tablet (0.5 mg total) by mouth at bedtime. 30 tablet 5   EPINEPHrine 0.3 mg/0.3 mL IJ SOAJ injection Inject 0.3 mg into the muscle once as needed (For anaphylaxis.). 1 each 1   estradiol (ESTRACE) 0.1 MG/GM vaginal cream Insert pea-sized amount nightly for 2 weeks then every other night     famotidine (PEPCID) 20 MG tablet TAKE 1 TABLET BY MOUTH TWICE A DAY 180 tablet 1   gabapentin (NEURONTIN) 600 MG tablet Take 1 tablet (600 mg total) by mouth 3 (three) times daily. 90 tablet 11   lidocaine (LIDODERM) 5 % PLACE 1 PATCH ONTO THE SKIN DAILY. REMOVE & DISCARD PATCH WITHIN 12 HOURS OR AS DIRECTED BY MD (Patient taking differently: Place 1 patch onto the skin as needed. Remove & Discard patch within 12 hours or as directed by MD) 30 patch 0   methocarbamol (ROBAXIN) 500 MG tablet Take 1 tablet (500 mg total) by mouth 4 (four) times daily. 60 tablet 10   Multiple Vitamin (MULTI-VITAMINS) TABS Take 1 tablet by mouth daily.      nebivolol (BYSTOLIC) 5 MG tablet TAKE 1 TABLET BY MOUTH EVERY DAY 90 tablet 3   neomycin-polymyxin-hydrocortisone (CORTISPORIN) OTIC solution Place 3 drops into the left ear 4 (four) times daily. 10 mL 0   Nitroglycerin (RECTIV) 0.4 % OINT Place 0.4 inches rectally 2 (two) times daily as needed (For anal fissures.). 30 g 1   ondansetron (ZOFRAN ODT) 4 MG disintegrating tablet Take 1 tablet (4 mg total) by mouth every 8 (eight) hours as needed for nausea or vomiting. 20 tablet 1   ondansetron (ZOFRAN) 8 MG tablet Take 1 tablet (8 mg total) by mouth every 8 (eight) hours as needed for nausea or vomiting. 20 tablet 1   Polyethylene Glycol 3350 (MIRALAX PO) Take 17 g by mouth daily as needed (constipation).      primidone (MYSOLINE) 50 MG tablet TAKE 1 TABLET IN MORNING AND 3 TABLETS AT BEDTIME 120 tablet 5   Travoprost, BAK Free, (TRAVATAN) 0.004 % SOLN ophthalmic solution INSTILL 1 DROP INTO BOTH EYES AT BEDTIME 5 mL 3    vitamin B-12 (CYANOCOBALAMIN) 1000 MCG tablet Take 1 tablet (1,000 mcg total) by mouth daily. 30 tablet 3   zinc gluconate 50 MG tablet Take 50 mg by mouth daily.     meloxicam (MOBIC) 15 MG tablet Take 1 tablet (15 mg total) by mouth daily. (Patient not taking: Reported on 07/25/2021) 30 tablet 2   doxycycline (VIBRAMYCIN) 100 MG capsule Take 1 capsule (100 mg total) by mouth 2 (two) times daily. 20 capsule 0   No facility-administered medications prior to visit.    Allergies  Allergen Reactions   Bee Venom Anaphylaxis   Penicillins Other (See Comments)    Reaction unknown from childhood Has patient had a PCN reaction causing immediate rash, facial/tongue/throat swelling, SOB or lightheadedness with hypotension: unknown Has patient had a PCN reaction causing severe rash involving mucus membranes or skin necrosis: unknown  Has patient had a PCN reaction that required hospitalization: no Has patient had a PCN reaction occurring within the last 10 years: no If all of the above answers are "NO", then may proceed with Cephalosporin use.    Dextromethorphan Polistirex Er Other (See Comments)  Pt states caused "a lump" in her throat, making it difficult to swallow   Diflucan [Fluconazole]     Primidone interaction   Hydrocodone Nausea And Vomiting   Oxycodone-Acetaminophen Other (See Comments)    Made fingers tingle and go numb, started "feeling weird".    Tape Itching and Other (See Comments)    Bandaids - leaves red marks    ROS Review of Systems  Constitutional:  Negative for chills, fatigue and fever.  Skin:  Positive for rash.  Neurological:  Positive for numbness (tingling/burning left mid upper area).  Psychiatric/Behavioral:  Dysphoric mood: red itchy rash left mid upper back.      Objective:    Physical Exam Constitutional:      General: She is not in acute distress.    Appearance: Normal appearance. She is normal weight. She is not ill-appearing, toxic-appearing or  diaphoretic.  HENT:     Head: Normocephalic.  Skin:    Findings: Rash (small mixture of papular red lesions with two blister appearing lesions) present.  Neurological:     General: No focal deficit present.     Mental Status: She is alert and oriented to person, place, and time.  Psychiatric:        Mood and Affect: Mood normal.        Behavior: Behavior normal.        Thought Content: Thought content normal.        Judgment: Judgment normal.       BP 118/72    Pulse 70    Temp (!) 97 F (36.1 C) (Temporal)    Wt 171 lb 2 oz (77.6 kg)    LMP 10/11/2010    SpO2 98%    BMI 26.41 kg/m  Wt Readings from Last 3 Encounters:  07/25/21 171 lb 2 oz (77.6 kg)  04/13/21 169 lb 6 oz (76.8 kg)  04/08/21 169 lb 6.4 oz (76.8 kg)     Health Maintenance Due  Topic Date Due   Zoster Vaccines- Shingrix (1 of 2) Never done   PAP SMEAR-Modifier  01/03/2018   COVID-19 Vaccine (4 - Booster for Moderna series) 10/01/2020    There are no preventive care reminders to display for this patient.  Lab Results  Component Value Date   TSH 1.700 07/04/2018   Lab Results  Component Value Date   WBC 7.4 06/07/2020   HGB 13.0 06/07/2020   HCT 37.4 06/07/2020   MCV 89.7 06/07/2020   PLT 377.0 06/07/2020   Lab Results  Component Value Date   NA 140 04/13/2021   K 4.4 04/13/2021   CO2 31 04/13/2021   GLUCOSE 91 04/13/2021   BUN 15 04/13/2021   CREATININE 0.71 04/13/2021   BILITOT 0.5 04/13/2021   ALKPHOS 91 04/13/2021   AST 23 04/13/2021   ALT 32 04/13/2021   PROT 7.5 04/13/2021   ALBUMIN 4.6 04/13/2021   CALCIUM 9.8 04/13/2021   ANIONGAP 10 11/08/2019   GFR 94.92 04/13/2021   Lab Results  Component Value Date   HGBA1C 5.2 01/01/2018      Assessment & Plan:   Problem List Items Addressed This Visit       Musculoskeletal and Integument   Rash and nonspecific skin eruption    Suspected shingles along left dermatone.  rx valtrex TID x 7 days.  Monitor rash for signs of  secondary infection, d/w pt.  If no improvement pt advised to f/u.       Relevant Medications  valACYclovir (VALTREX) 1000 MG tablet     Other   Urinary frequency - Primary    Urine ordered for today , pending results although likely in relation to increased water intake.      Relevant Orders   Urinalysis, Routine w reflex microscopic   Urine Culture    Meds ordered this encounter  Medications   valACYclovir (VALTREX) 1000 MG tablet    Sig: Take 1 tablet (1,000 mg total) by mouth 3 (three) times daily for 7 days.    Dispense:  21 tablet    Refill:  0    Order Specific Question:   Supervising Provider    Answer:   BEDSOLE, AMY E [[id number]]    Follow-up: Return if symptoms worsen or fail to improve.    Eugenia Pancoast, FNP

## 2021-07-25 NOTE — Patient Instructions (Signed)
Suspected shingles, so I will treat as such. Start valtrex that has been sent to your pharmacy.   It was a pleasure seeing you today! Please do not hesitate to reach out with any questions and or concerns.  Regards,   Eugenia Pancoast FNP-C

## 2021-07-25 NOTE — Assessment & Plan Note (Signed)
Urine ordered for today , pending results although likely in relation to increased water intake.

## 2021-07-25 NOTE — Assessment & Plan Note (Addendum)
Suspected shingles along left dermatone.  rx valtrex TID x 7 days.  Monitor rash for signs of secondary infection, d/w pt.  If no improvement pt advised to f/u.

## 2021-07-25 NOTE — Addendum Note (Signed)
Addended by: Leeanne Rio on: 07/25/2021 12:32 PM   Modules accepted: Orders

## 2021-07-26 LAB — URINE CULTURE
MICRO NUMBER:: 12968049
SPECIMEN QUALITY:: ADEQUATE

## 2021-08-02 ENCOUNTER — Encounter
Payer: BC Managed Care – PPO | Attending: Physical Medicine and Rehabilitation | Admitting: Physical Medicine and Rehabilitation

## 2021-08-02 ENCOUNTER — Other Ambulatory Visit: Payer: Self-pay

## 2021-08-02 ENCOUNTER — Encounter: Payer: Self-pay | Admitting: Physical Medicine and Rehabilitation

## 2021-08-02 VITALS — BP 126/86 | HR 86 | Ht 67.5 in | Wt 172.0 lb

## 2021-08-02 DIAGNOSIS — M5440 Lumbago with sciatica, unspecified side: Secondary | ICD-10-CM | POA: Insufficient documentation

## 2021-08-02 DIAGNOSIS — G8929 Other chronic pain: Secondary | ICD-10-CM | POA: Insufficient documentation

## 2021-08-02 DIAGNOSIS — K5903 Drug induced constipation: Secondary | ICD-10-CM | POA: Diagnosis not present

## 2021-08-02 DIAGNOSIS — Z131 Encounter for screening for diabetes mellitus: Secondary | ICD-10-CM | POA: Diagnosis not present

## 2021-08-02 DIAGNOSIS — M25562 Pain in left knee: Secondary | ICD-10-CM | POA: Diagnosis not present

## 2021-08-02 NOTE — Progress Notes (Signed)
Subjective:    Patient ID: Sharon [name], female    DOB: 1966-01-29, 56 y.o.   MRN: [id number]  HPI  Sharon [name] is a 56 year old woman who presents for follow-up of  bilateral knee pain following left knee replacement in 04/2019.   1) Bilateral knee pain L>R: -knee pain has been stable since last visit -Her ROM is still restricted in her left knee.  -She asks whether the scar tissue will ever heal.  -She has follow-up with Sharon [name] and Sharon [name] -Currently is not planning to pursue surgery.  -She has been off hydrocodone.  -She would be interested in increasing the Gabapentin dose.  -She would like to discuss an option for inflammation. She has tried aspirations but the fluid comes back right away.  -She got a  Steroid injection and experienced worsening range of motion in her left knee- that was from Sharon [name]. He also pulled fluid off. It does help with pain, but she does not want another given her worsening range of motion afterward. She does have improved sensation in her knee after the injection! -has been hurting -discussed Qutenza as a treatment option -her current regimen is working for her.  -has been having digestive issues and stopped meloxicam.   2) Anxiety: This has much improved with the Gabapentin.  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  -she has used blue emu oil and diclofenac gel without great benefit -has appointment with physician today.  -she does use her hands a lot.  -she has been dropping things recently.   9) lower back pain -left pain in her boy friend's car so had a hard time getting in today -hurts severely sometimes -pinches when she  walks -sitting also hurts -hurt it while bending and turning -has been worse since she carried her grandchild -she is unable to vacuum.  -discussed therapy and using lidocaine patch, massage.  -she is ok using gabapentin and her muscle relaxers.  -found lidocaine patch to be a lifesaver while she was at the beach.  -ready to try Qutenza today -has been severe recently -plans to follow-up with her orthopedic surgeon.   10) Constipation -has been needing to take laxatives.   Prior history:  Since last visit she has stopped Norco as it caused respiratory distress. We tried Cymbalta 40mg  without benefit or side effects. She has tried voltaren gel, blue emu oil without benefit. She has tried CBD.   Her daughter was hospitalized with COVID pneumonia and is 8 months pregnant.   She still has a fever of 99.3. She took an antibiotic for ear pain and this helped her symptoms. It also helped her breathing.   Current dose of Norco is working well for her. It causes her to have hot flashes but she would still like to continue with this dose.  Discussed Sprint PNS with her again and she still does not like the thought of this option.  She did feel that some of the scar tissue has broken.   Prior history: She has been trying to improve quadriceps musculature. She discusses some of the exercises she does.   She has been denied in her disability. She has talked to an attorney.  She continues to ambulate with a cane.   She has tried the compounding cream and that does not help much. She has been trying Hemp cream which is helping.  She is getting foot insoles from the podiatrist. They should be ready in a week or two.   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   Can ride the stationary bicycle and that does not hurt. She sues it every day. She uses leg weights. When   Scar tissue grew  back quickly post-surgery. She could not get PT appointments for 3 weeks.   Her surgeon does not want her to break the hardware and he may have to do arthroscopic procedure to flush out the inflammation.   She has tried ice which stopped helping, nabumetone 750 mg BID, Norco 1-2 tablets three times per day PRN (she has 12 left). She uses diclofenac gel   She takes Miralax for constipation. She takes up to 2 times per day. She has a history of IBS.   [facility] in [place]- she was a Research scientist (physical sciences). They let her go because she did not return in 12 weeks.   She was doing pool therapy but chemicals in the pool were increased because of COVID.  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 Notes reviewed- recently had a right subacromial injection.   Current pain is 8/10.    Pain Inventory Average Pain 7 Pain Right Now 8 My pain is sharp, burning, dull, stabbing, and aching  In the last 24 hours, has pain interfered with the following? General activity 8 Relation with others 8 Enjoyment of life 9 What TIME of day is your pain at its worst?  daytime, evening Sleep (in general) Good  Pain is worse with: walking, bending, sitting, inactivity, and standing Pain improves with: rest, heat/ice and medication Relief from Meds: 7     Family History  Problem Relation Age of Onset   Emphysema Mother        smoker   Allergies Mother    Asthma Mother    Heart disease Mother        AMI as cause of death   COPD Mother    Asthma Father    Heart disease Father        AMI as cause of death   Diabetes Father    Asthma Brother    Heart disease Brother 7       heart failure   Lung cancer Maternal Grandfather        was a smoker   Cancer Maternal Grandfather        lung   Heart disease Paternal Grandmother    Heart disease Paternal Grandfather    Colon cancer Neg Hx    Social History   Socioeconomic History   Marital status: Divorced    Spouse name: Not on file    Number of children: 1   Years of education: Not on file   Highest education level: Not on file  Occupational History   Occupation: Restaurant manager, fast food: [employer]  Tobacco Use   Smoking status: Never   Smokeless tobacco: Never  Vaping Use   Vaping Use: Never used  Substance and Sexual Activity   Alcohol use: Not Currently    Comment: occ   Drug use: No    Comment:  former maryjuana use- quit in 1995   Sexual activity: Yes    Birth control/protection: Surgical  Other Topics Concern   Not on file  Social History Narrative   Marital status: divorced x 2; dating seriously x 3 years.        Children:  1 daughter (28); no grandchildren      Lives:  With boyfriend.        Employment: works at [employer]: none      Alcohol: rarely; socially      Exercise: none; has treadmill and elliptical and bikes   Coffee in am / 1/2 of coke in afternoon    High school education      01/29/20   From: the area   Living: alone - but boyfriend living with her Wilfred Lacy (2020) but knew each other in high school   Work: on disability - working on appeal      Family: one daughter [name] - one grandchild coming in January       Enjoys: relax and watch tv, use to like fishing/boating/rafting, reading, crochet       Exercise: rehab exercises   Diet: not great - limited intake      Safety   Seat belts: Yes    Guns: Yes  and secure   Right handed   One story home   Drinks caffeine   Social Determinants of Health   Financial Resource Strain: Not on file  Food Insecurity: Not on file  Transportation Needs: Not on file  Physical Activity: Not on file  Stress: Not on file  Social Connections: Not on file   Past Surgical History:  Procedure Laterality Date   ABDOMINAL HYSTERECTOMY     CATARACT EXTRACTION Bilateral 2015   COLONOSCOPY     CYSTOSCOPY W/ URETERAL STENT PLACEMENT Right 05/23/2017   Procedure: CYSTOSCOPY WITH RETROGRADE PYELOGRAM/URETERAL  RIGHT STENT PLACEMENT;  Surgeon: Bjorn Loser, MD;  Location: [facility];  Service: Urology;  Laterality: Right;   CYSTOSCOPY W/ URETERAL STENT PLACEMENT Right 06/04/2017   Procedure: CYSTOSCOPY WITHRIGHT RETROGRADE PYELOGRAMRIGHT /URETEREOSCOPY  STENT PLACEMENT;  Surgeon: Lucas Mallow, MD;  Location[facility];  Service: Urology;  Laterality: Right;   ENDOMETRIAL ABLATION     EYE SURGERY Bilateral    cataract surgery with lens implants   HEMORRHOID SURGERY  2011   INCONTINENCE SURGERY     INNER EAR SURGERY     X 2   KNEE SURGERY     RECTAL PROLAPSE REPAIR     RETINAL DETACHMENT SURGERY Bilateral 2000   RETINAL DETACHMENT SURGERY Bilateral    TOTAL KNEE ARTHROPLASTY Left 04/29/2019   TOTAL KNEE ARTHROPLASTY Left 04/29/2019   Procedure: LEFT TOTAL KNEE ARTHROPLASTY;  Surgeon: Mcarthur Rossetti, MD;  Location: [facility];  Service: Orthopedics;  Laterality: Left;   TUBAL LIGATION     VEIN SURGERY Left 04/2010   Past Medical History:  Diagnosis Date   Allergy    Anal fissure    Anemia    borderline   Anxiety    Arthritis    Blood clot in vein    left leg   Detached retina    BOTH SCLERA BUCKLE BOTH EYES   DVT (deep venous thrombosis) (HCC)    left leg   Endometriosis    Family history of adverse reaction to anesthesia    DAUGHTER POST OP PONV   GERD (gastroesophageal reflux disease)    Glaucoma  RIGHT WORST THAN LEFT   Heart murmur    "caused by anxiety"    History of hemorrhoids    History of kidney stones    Hypertension    IBS (irritable bowel syndrome)    Perforated eardrum, right    SMALL HOLE   PONV (postoperative nausea and vomiting)    Pulmonary embolism (HCC) 6 YRS AGO   BP 126/86    Pulse 86    Ht 5' 7.5" (1.715 m)    Wt 172 lb (78 kg)    LMP 10/11/2010    SpO2 96%    BMI 26.54 kg/m   Opioid Risk Score:   Fall Risk Score:  `1  Depression screen PHQ 2/9  Depression screen [facility] 2/9 08/02/2021 04/13/2021 04/08/2021 10/07/2020  04/09/2020 01/29/2020 01/23/2020  Decreased Interest 1 2 0 3 0 3 3  Down, Depressed, Hopeless 1 2 0 2 0 2 3  PHQ - 2 Score 2 4 0 5 0 5 6  Altered sleeping - 2 - 0 - 2 3  Tired, decreased energy - 2 - 2 - 2 3  Change in appetite - 2 - 3 - 2 3  Feeling bad or failure about yourself  - 0 - 0 - 0 0  Trouble concentrating - 0 - 0 - 1 1  Moving slowly or fidgety/restless - 0 - 0 - 0 0  Suicidal thoughts - 0 - 0 - 0 0  PHQ-9 Score - 10 - 10 - 12 16  Difficult doing work/chores - Extremely dIfficult - Somewhat difficult - Very difficult -  Some recent data might be hidden   Review of Systems  Constitutional: Negative.        Weight loss  HENT: Negative.   Eyes: Negative.   Respiratory: Positive for shortness of breath.   Cardiovascular: Negative.   Gastrointestinal: Negative.   Endocrine: Negative.   Genitourinary: Negative.   Musculoskeletal: Positive for arthralgias, back pain and gait problem.       Spasms  Skin: Negative.   Allergic/Immunologic: Negative.   Neurological: Positive for tremors and numbness.  Hematological: Negative.   Psychiatric/Behavioral:       Anxiety  All other systems reviewed and are negative.      Objective:   Physical Exam Gen: no distress, normal appearing HEENT: oral mucosa pink and moist, NCAT Cardio: Reg rate Chest: normal effort, normal rate of breathing Abd: soft, non-distended Ext: no edema Psych: pleasant, normal affect Skin: intact Musculoskeletal: Atrophy of left thigh, swelling and bruising on left knee. Not tight and full currently. Right knee is also tender to palpation. Palable trigger points in her upper and lower back. +Kemp's test bilaterally.  Neuro: Ambulating with cane with antalgic gait. +Phalen's test bilaterally Psych: pleasant, normal affect, in positive spirits.     Assessment & Plan:  Mrs. Beaudin is a 56 year old woman who presents for follow-up:   1) Left knee pain post-surgery: -She has a plan for repeat surgical  procedure with Sharon [name] that will hopefully provide relief.  -She has been icing regularly- recommended three tines per day for 15 minutes.  -She has been through physical therapy and performs her exercises diligently. --Discussed Sprint PNS system as an option of pain treatment via neuromodulation. Provided following link for patient to learn more about the system: https://www.sprtherapeutics.com/. She does not like the feeling of things under her skin and defers that option.  -She weaned herself all the steroids.  -Discussed Qutenza as an option  for neuropathic pain control. Discussed that this is a capsaicin patch, stronger than capsaicin cream. Discussed that it is currently approved for diabetic peripheral neuropathy and post-herpetic neuralgia, but that it has also shown benefit in treating other forms of neuropathy. Provided patient with link to site to learn more about the patch: CinemaBonus.fr. Discussed that the patch would be placed in office and benefits usually last 3 months. Discussed that unintended exposure to capsaicin can cause severe irritation of eyes, mucous membranes, respiratory tract, and skin, but that Qutenza is a local treatment and does not have the systemic side effects of other nerve medications. Discussed that there may be pain, itching, erythema, and decreased sensory function associated with the application of Qutenza. Side effects usually subside within 1 week. A cold pack of analgesic medications can help with these side effects. Blood pressure can also be increased due to pain associated with administration of the patch.   1 patch of Qutenza was applied to the area of pain. Ice packs were applied during the procedure to ensure patient comfort. Blood pressure was monitored every 15 minutes. The patient tolerated the procedure well. Post-procedure instructions were given and follow-up has been scheduled.   -Refilled Robaxin as she is nervous about feeling too  sleepy with the Tizanidine, and the Robaxin does help.   -Refilled Gabapentin to 600mg  TID PRN.   -Continue neoprene knee sleeve for proprioception  -Discussed Qutenza as an option for neuropathic pain control. Discussed that this is a capsaicin patch, stronger than capsaicin cream. Discussed that it is currently approved for diabetic peripheral neuropathy and post-herpetic neuralgia, but that it has also shown benefit in treating other forms of neuropathy. Provided patient with link to site to learn more about the patch: CinemaBonus.fr. Discussed that the patch would be placed in office and benefits usually last 3 months. Discussed that unintended exposure to capsaicin can cause severe irritation of eyes, mucous membranes, respiratory tract, and skin, but that Qutenza is a local treatment and does not have the systemic side effects of other nerve medications. Discussed that there may be pain, itching, erythema, and decreased sensory function associated with the application of Qutenza. Side effects usually subside within 1 week. A cold pack of analgesic medications can help with these side effects. Blood pressure can also be increased due to pain associated with administration of the patch. Discussed that this could help improve her sensation.   -She has tried Celebrex and Tramadol without benefit. She had respiratory distress with hydrocodone.   -She had worse pain with steroid injection in her left knee.   -Discussed steroids but she did not like the side effect profile. Discussed the different steroid options. She weaned off these because she developed mouth sores.   -Continue turmeric and cherries.   -Continue Lidocaine patches today.   -She would like rheum panel checked: ordered, reviewed results which were negative, and discussed with patient.  -Refilled meloxicam.   2) Bunion:  -Referred to podiatry for bunion and orthotic fitting. She will be receiving these in 2 weeks.    3) Irritable bowel syndrome: -She does not eat much sugar or sweets.  -She drinks 1 coke per day.  -She cuts tea out.  -She drinks water, coffee.  -She has been going more regularly and has not needed her Miralax.   4) Impaired Concentration: - This persists -She forgets things easily.  -She tries not to multitask when she was with her grandmother.   5) Anxiety: The Gabapentin is helping  with this as well, continue  6) Vulvodynia: -discussed various treatment options: biofeedback, medications, continue estradiol.  -continue Amitriptyline 10mg  HS, discussed moving dose earlier in the evening.   7) Cervical and lumbar myofascial pain: -try massage -discussed benefits of therapy and trigger point injections -apply lidocaine patch.   8) Bilateral hand pain: likely secondary to arthritis compounded by carpal tunnel syndrome -continue Gabapentin which is helping -continue f/u with surgery as planned.   9) Low back pain, appears to be secondary to face arthritis -discussed XR, but she defers due to potential cost -continue lidocaine patches -continue postural correction -Provided with a pain relief journal and discussed that it contains foods and lifestyle tips to naturally help to improve pain. Discussed that these lifestyle strategies are also very good for health unlike some medications which can have negative side effects. Discussed that the act of keeping a journal can be therapeutic and helpful to realize patterns what helps to trigger and alleviate pain.   -Discussed following foods that may reduce pain: 1) Ginger (especially studied for arthritis)- reduce leukotriene production to decrease inflammation 2) Blueberries- high in phytonutrients that decrease inflammation 3) Salmon- marine omega-3s reduce joint swelling and pain 4) Pumpkin seeds- reduce inflammation 5) dark chocolate- reduces inflammation 6) turmeric- reduces inflammation 7) tart cherries - reduce pain and  stiffness 8) extra virgin olive oil - its compound olecanthal helps to block prostaglandins  9) chili peppers- can be eaten or applied topically via capsaicin 10) mint- helpful for headache, muscle aches, joint pain, and itching 11) garlic- reduces inflammation  Link to further information on diet for chronic pain: http://www.randall.com/   10) shoulder pain -used to get shots in the pt, but laying on her back has helped to decrease her shoulder pain

## 2021-08-02 NOTE — Addendum Note (Signed)
Addended by: Izora Ribas on: 08/02/2021 12:32 PM   Modules accepted: Orders

## 2021-08-03 LAB — HEMOGLOBIN A1C
Est. average glucose Bld gHb Est-mCnc: 114 mg/dL
Hgb A1c MFr Bld: 5.6 % (ref 4.8–5.6)

## 2021-08-05 DIAGNOSIS — M6281 Muscle weakness (generalized): Secondary | ICD-10-CM | POA: Diagnosis not present

## 2021-08-05 DIAGNOSIS — M62838 Other muscle spasm: Secondary | ICD-10-CM | POA: Diagnosis not present

## 2021-08-05 DIAGNOSIS — N9411 Superficial (introital) dyspareunia: Secondary | ICD-10-CM | POA: Diagnosis not present

## 2021-08-05 DIAGNOSIS — K5900 Constipation, unspecified: Secondary | ICD-10-CM | POA: Diagnosis not present

## 2021-08-10 DIAGNOSIS — M62838 Other muscle spasm: Secondary | ICD-10-CM | POA: Diagnosis not present

## 2021-08-10 DIAGNOSIS — M6281 Muscle weakness (generalized): Secondary | ICD-10-CM | POA: Diagnosis not present

## 2021-08-10 DIAGNOSIS — K5900 Constipation, unspecified: Secondary | ICD-10-CM | POA: Diagnosis not present

## 2021-08-10 DIAGNOSIS — R3915 Urgency of urination: Secondary | ICD-10-CM | POA: Diagnosis not present

## 2021-08-17 DIAGNOSIS — K5900 Constipation, unspecified: Secondary | ICD-10-CM | POA: Diagnosis not present

## 2021-08-17 DIAGNOSIS — N9411 Superficial (introital) dyspareunia: Secondary | ICD-10-CM | POA: Diagnosis not present

## 2021-08-17 DIAGNOSIS — M6281 Muscle weakness (generalized): Secondary | ICD-10-CM | POA: Diagnosis not present

## 2021-08-17 DIAGNOSIS — M62838 Other muscle spasm: Secondary | ICD-10-CM | POA: Diagnosis not present

## 2021-08-23 ENCOUNTER — Other Ambulatory Visit: Payer: Self-pay | Admitting: Physical Medicine and Rehabilitation

## 2021-08-23 DIAGNOSIS — R5383 Other fatigue: Secondary | ICD-10-CM

## 2021-08-24 DIAGNOSIS — N9411 Superficial (introital) dyspareunia: Secondary | ICD-10-CM | POA: Diagnosis not present

## 2021-08-24 DIAGNOSIS — M62838 Other muscle spasm: Secondary | ICD-10-CM | POA: Diagnosis not present

## 2021-08-24 DIAGNOSIS — M545 Low back pain, unspecified: Secondary | ICD-10-CM | POA: Diagnosis not present

## 2021-08-25 LAB — B12 AND FOLATE PANEL
Folate: 3.3 ng/mL (ref >3.0)
Vitamin B-12: 387 pg/mL (ref 232–1245)

## 2021-08-25 LAB — IRON,TIBC AND FERRITIN PANEL
Ferritin: 46 ng/mL (ref 15–150)
Iron Saturation: 24 % (ref 15–55)
Iron: 82 ug/dL (ref 27–159)
Total Iron Binding Capacity: 340 ug/dL (ref 250–450)
UIBC: 258 ug/dL (ref 131–425)

## 2021-08-26 ENCOUNTER — Other Ambulatory Visit: Payer: Self-pay | Admitting: Physical Medicine and Rehabilitation

## 2021-08-26 MED ORDER — GABAPENTIN 100 MG PO CAPS: 200.0000 mg | ORAL_CAPSULE | Freq: Every day | ORAL | 3 refills | Status: DC

## 2021-08-30 DIAGNOSIS — M5441 Lumbago with sciatica, right side: Secondary | ICD-10-CM | POA: Diagnosis not present

## 2021-09-01 DIAGNOSIS — K5900 Constipation, unspecified: Secondary | ICD-10-CM | POA: Diagnosis not present

## 2021-09-01 DIAGNOSIS — M6281 Muscle weakness (generalized): Secondary | ICD-10-CM | POA: Diagnosis not present

## 2021-09-01 DIAGNOSIS — M62838 Other muscle spasm: Secondary | ICD-10-CM | POA: Diagnosis not present

## 2021-09-01 DIAGNOSIS — N9411 Superficial (introital) dyspareunia: Secondary | ICD-10-CM | POA: Diagnosis not present

## 2021-09-07 ENCOUNTER — Other Ambulatory Visit: Payer: Self-pay | Admitting: Physical Medicine and Rehabilitation

## 2021-09-07 DIAGNOSIS — H401131 Primary open-angle glaucoma, bilateral, mild stage: Secondary | ICD-10-CM | POA: Diagnosis not present

## 2021-09-07 DIAGNOSIS — K5900 Constipation, unspecified: Secondary | ICD-10-CM | POA: Diagnosis not present

## 2021-09-07 DIAGNOSIS — M6281 Muscle weakness (generalized): Secondary | ICD-10-CM | POA: Diagnosis not present

## 2021-09-07 DIAGNOSIS — M62838 Other muscle spasm: Secondary | ICD-10-CM | POA: Diagnosis not present

## 2021-09-07 DIAGNOSIS — N9411 Superficial (introital) dyspareunia: Secondary | ICD-10-CM | POA: Diagnosis not present

## 2021-09-09 DIAGNOSIS — M5416 Radiculopathy, lumbar region: Secondary | ICD-10-CM | POA: Diagnosis not present

## 2021-09-22 ENCOUNTER — Other Ambulatory Visit: Payer: Self-pay | Admitting: Family Medicine

## 2021-09-22 DIAGNOSIS — K219 Gastro-esophageal reflux disease without esophagitis: Secondary | ICD-10-CM

## 2021-09-27 DIAGNOSIS — M5416 Radiculopathy, lumbar region: Secondary | ICD-10-CM | POA: Diagnosis not present

## 2021-09-28 DIAGNOSIS — H401131 Primary open-angle glaucoma, bilateral, mild stage: Secondary | ICD-10-CM | POA: Diagnosis not present

## 2021-10-03 ENCOUNTER — Other Ambulatory Visit: Payer: Self-pay | Admitting: Physical Medicine and Rehabilitation

## 2021-10-03 MED ORDER — METHOCARBAMOL 500 MG PO TABS: 500.0000 mg | ORAL_TABLET | Freq: Three times a day (TID) | ORAL | 11 refills | Status: DC | PRN

## 2021-10-10 DIAGNOSIS — M47816 Spondylosis without myelopathy or radiculopathy, lumbar region: Secondary | ICD-10-CM | POA: Diagnosis not present

## 2021-10-12 ENCOUNTER — Ambulatory Visit (INDEPENDENT_AMBULATORY_CARE_PROVIDER_SITE_OTHER): Payer: BC Managed Care – PPO | Admitting: Family Medicine

## 2021-10-12 VITALS — BP 104/70 | HR 72 | Temp 98.3°F | Ht 67.5 in | Wt 171.0 lb

## 2021-10-12 DIAGNOSIS — N941 Unspecified dyspareunia: Secondary | ICD-10-CM | POA: Diagnosis not present

## 2021-10-12 DIAGNOSIS — G4700 Insomnia, unspecified: Secondary | ICD-10-CM | POA: Diagnosis not present

## 2021-10-12 DIAGNOSIS — G8929 Other chronic pain: Secondary | ICD-10-CM

## 2021-10-12 DIAGNOSIS — Z79899 Other long term (current) drug therapy: Secondary | ICD-10-CM

## 2021-10-12 DIAGNOSIS — F411 Generalized anxiety disorder: Secondary | ICD-10-CM | POA: Diagnosis not present

## 2021-10-12 DIAGNOSIS — M25562 Pain in left knee: Secondary | ICD-10-CM

## 2021-10-12 MED ORDER — ESTRADIOL 0.1 MG/GM VA CREA: TOPICAL_CREAM | VAGINAL | 1 refills | Status: DC

## 2021-10-12 MED ORDER — CLONAZEPAM 0.5 MG PO TABS: 0.5000 mg | ORAL_TABLET | Freq: Every day | ORAL | 5 refills | Status: DC

## 2021-10-12 NOTE — Patient Instructions (Addendum)
Consider trying ?- Lexapro ?- Prozac ?- Effexor ?- these are daily medications for anxiety or depression ? ?Pain with intercourse ?- restart estrogen cream ?- see if this helps ?- consider couples therapy with your partner ?

## 2021-10-12 NOTE — Assessment & Plan Note (Signed)
Stable.  Patient is unable to fall asleep without clonazepam 0.5 mg.  She tried half a dose without effect.  Of note she continues to feel groggy in the morning, likely due to gabapentin, amitriptyline, Robaxin, and primidone.  She continues to work with pain to decrease these medications.  We will continue to prescribe clonazepam based on her report that without it she still cannot sleep. ?

## 2021-10-12 NOTE — Progress Notes (Signed)
? ?Subjective:  ? ?  ?Sharon [name] is a 56 y.o. female presenting for Follow-up (6 mo) and Medication Refill ?  ? ? ?HPI ? ?#Insomnia ?- sleeping well ?- taking methocarbamol, primidone, gabapentin ?- also taking the clonazepam ?- cannot sleep without the clonazepam ?- has been on this for 12 years ?- was on during that day medication ?- does not respond to 1/2 pill of clonazepam ? ?#Chronic pain ?- has sciatic nerve pain ?- getting shots ?- may have to get disc surgery - has a cyst ?- follows with pain specialist ?- trying to get disability ?- cannot do any work at this time ? ?Feeling very groggy with the medication  ? ?Has IBS ?And hemorrhoids ? ?#Depression/anxiety ?- was on something in the past which stopped working and she stopped ?- took for 5 years ?-  ? ?Review of Systems ? ? ?Social History  ? ?Tobacco Use  ?Smoking Status Never  ?Smokeless Tobacco Never  ? ? ? ?   ?Objective:  ?  ?BP Readings from Last 3 Encounters:  ?10/12/21 104/70  ?08/02/21 126/86  ?07/25/21 118/72  ? ?Wt Readings from Last 3 Encounters:  ?10/12/21 171 lb (77.6 kg)  ?08/02/21 172 lb (78 kg)  ?07/25/21 171 lb 2 oz (77.6 kg)  ? ? ?BP 104/70   Pulse 72   Temp 98.3 ?F (36.8 ?C) (Oral)   Ht 5' 7.5" (1.715 m)   Wt 171 lb (77.6 kg)   LMP 10/11/2010   SpO2 99%   BMI 26.39 kg/m?  ? ? ?Physical Exam ?Constitutional:   ?   General: She is not in acute distress. ?   Appearance: She is well-developed. She is not diaphoretic.  ?HENT:  ?   Right Ear: External ear normal.  ?   Left Ear: External ear normal.  ?   Nose: Nose normal.  ?Eyes:  ?   Conjunctiva/sclera: Conjunctivae normal.  ?Cardiovascular:  ?   Rate and Rhythm: Normal rate.  ?Pulmonary:  ?   Effort: Pulmonary effort is normal.  ?Musculoskeletal:  ?   Cervical back: Neck supple.  ?   Comments: Ambulates with cane  ?Skin: ?   General: Skin is warm and dry.  ?   Capillary Refill: Capillary refill takes less than 2 seconds.  ?Neurological:  ?   Mental Status: She is alert.  Mental status is at baseline.  ?Psychiatric:     ?   Mood and Affect: Mood normal.     ?   Behavior: Behavior normal.  ? ? ? ? ? ?   ?Assessment & Plan:  ? ?Problem List Items Addressed This Visit   ? ?  ? Other  ? GAD (generalized anxiety disorder) - Primary  ?  Her GAD and PHQ-9 are slightly elevated.  She was on SSRIs in the past including Cymbalta however she has no recollection of being on Cymbalta.  She failed Zoloft.  Encouraged talk therapy, suspect this is too costly at this time.  Encouraged consideration for SSRI and she was concerned about daytime sleepiness advised that this is less likely to be a side effect and would still encourage a daily medication.  She will consider an SSRI or SNRI and follow-up if she wants to start something.  Discussed that at this time I do not think increasing clonazepam would be helpful in the long-term. ? ?  ?  ? Chronic pain of left knee  ?  Appreciate support of pain medicine provider, continue  to work to titrate medications as able. ? ?  ?  ? Relevant Medications  ? gabapentin (NEURONTIN) 600 MG tablet  ? clonazePAM (KLONOPIN) 0.5 MG tablet  ? Pain in female genitalia on intercourse  ?  Failed pelvic PT. Discussed restarting estradiol cream to see if that improves her symptoms, refill provided.  Also advised that she consider couples therapy with her partner who is adamant about having penetrative intercourse despite the fact that she is tearful during this due to pain.  At this time she says she cannot afford that. ? ?  ?  ? Insomnia  ?  Stable.  Patient is unable to fall asleep without clonazepam 0.5 mg.  She tried half a dose without effect.  Of note she continues to feel groggy in the morning, likely due to gabapentin, amitriptyline, Robaxin, and primidone.  She continues to work with pain to decrease these medications.  We will continue to prescribe clonazepam based on her report that without it she still cannot sleep. ? ?  ?  ? Chronic prescription benzodiazepine  use  ? ? ? ?Return in about 6 months (around 04/13/2022) for medication refill . ? ?Lesleigh Noe, MD ? ? ? ?

## 2021-10-12 NOTE — Assessment & Plan Note (Signed)
Her GAD and PHQ-9 are slightly elevated.  She was on SSRIs in the past including Cymbalta however she has no recollection of being on Cymbalta.  She failed Zoloft.  Encouraged talk therapy, suspect this is too costly at this time.  Encouraged consideration for SSRI and she was concerned about daytime sleepiness advised that this is less likely to be a side effect and would still encourage a daily medication.  She will consider an SSRI or SNRI and follow-up if she wants to start something.  Discussed that at this time I do not think increasing clonazepam would be helpful in the long-term. ?

## 2021-10-12 NOTE — Assessment & Plan Note (Addendum)
Failed pelvic PT. Discussed restarting estradiol cream to see if that improves her symptoms, refill provided.  Also advised that she consider couples therapy with her partner who is adamant about having penetrative intercourse despite the fact that she is tearful during this due to pain.  At this time she says she cannot afford that. ?

## 2021-10-12 NOTE — Assessment & Plan Note (Signed)
Appreciate support of pain medicine provider, continue to work to titrate medications as able. ?

## 2021-11-02 DIAGNOSIS — M47816 Spondylosis without myelopathy or radiculopathy, lumbar region: Secondary | ICD-10-CM | POA: Diagnosis not present

## 2021-11-22 DIAGNOSIS — M5416 Radiculopathy, lumbar region: Secondary | ICD-10-CM | POA: Diagnosis not present

## 2021-11-29 NOTE — Progress Notes (Unsigned)
NEUROLOGY FOLLOW UP OFFICE NOTE  Sharon [name] [id number]  Assessment/Plan:   Essential tremor   1.Primidone '50mg'$  in AM and '150mg'$  at bedtime.  We can increase morning dose to '100mg'$  if needed. 2.Follow up one year  Total time spent reviewing records and face to face with patient to discuss history of present illness and management was 35 minutes.   Subjective:  Sharon [name] is a 56 year old female with generalized anxiety disorder, restless leg syndrome, hypertension, glaucoma, arthritis, chronic pain and history of PE and DVT in left leg who follows up for essential tremor   UPDATE: Medications:  Primidone '50mg'$  in AM and '100mg'$  at bedtime; amitriptyline '10mg'$  QHS (not taking it), gabapentin '600mg'$  BID and 600-'800mg'$  QHS, Mobic, clonazepam 0.'5mg'$  QHS, Bystolic, Robaxin, estradiol   She decreased the night dose of primidone back to '100mg'$  because she thought '150mg'$  made her feel funny.  Tremors are unchanged.  Still may drop objects here and there but not constant.       HISTORY: She started having tremors around 2015.  It involves both hands.  It interferes with daily tasks.  She drops a lot.  She has occasional trouble with utensils or writing.  Nothing particular makes it worse.  Steadying her wrist on the arm rest will suppress it.     Her mother and grandmother had tremor.   She has chronic generalized pain as well as neck pain.  She reports a creepy crawly feeling under her skin, including back and down the legs.  She has been diagnosed with restless leg syndrome.  MRI of cervical spine from 04/12/18 was personally reviewed and showed cervical spondylosis, degenerative disc disease and congenitally short pedicles, causing mild impingement at C3-4 on left, C6-7 on right and C7-T1 on left.  CT of right shoulder on 04/11/18 showed severe supraspinatus and infraspinatus tendinopathy without tear as well as small type 2 SLAP tear.  She notes a 'pinching" pain radiating down the lateral  arm and into the fingers with associated numbness and tingling in the fingers.  MRI lumbar spine from 04/13/18 showed L4-L5 right foraminal disc protrusion that may be causing right L4 nerve root irritation.  L5-S1 moderate face arthrosis and small central protrusion with annular fissure.  She underwent lab work on 03/04/2019 to evaluate chronic pain.  ANA was negative, RF negative, sed rate 5, CRP <0.1, CK 49   Past medications:  Cymbalta (worsened tremor)    PAST MEDICAL HISTORY: Past Medical History:  Diagnosis Date   Allergy    Anal fissure    Anemia    borderline   Anxiety    Arthritis    Blood clot in vein    left leg   Detached retina    BOTH SCLERA BUCKLE BOTH EYES   DVT (deep venous thrombosis) (HCC)    left leg   Endometriosis    Family history of adverse reaction to anesthesia    DAUGHTER POST OP PONV   GERD (gastroesophageal reflux disease)    Glaucoma    RIGHT WORST THAN LEFT   Heart murmur    "caused by anxiety"    History of hemorrhoids    History of kidney stones    Hypertension    IBS (irritable bowel syndrome)    Perforated eardrum, right    SMALL HOLE   PONV (postoperative nausea and vomiting)    Pulmonary embolism (HCC) 6 YRS AGO    MEDICATIONS: Current Outpatient Medications on File Prior to Visit  Medication Sig Dispense Refill   acetaminophen (TYLENOL) 500 MG tablet Take 1,000 mg by mouth every 8 (eight) hours as needed for moderate pain.      acyclovir (ZOVIRAX) 400 MG tablet Take 1 tablet (400 mg total) by mouth at bedtime. (Patient taking differently: Take 400 mg by mouth as needed.) 90 tablet 3   albuterol (VENTOLIN HFA) 108 (90 Base) MCG/ACT inhaler INHALE 1-2 PUFFS BY MOUTH EVERY 6 HOURS AS NEEDED FOR WHEEZE OR SHORTNESS OF BREATH 8.5 each 2   amitriptyline (ELAVIL) 10 MG tablet TAKE 1 TABLET BY MOUTH EVERYDAY AT BEDTIME 90 tablet 1   BESIVANCE 0.6 % SUSP Place 1 drop into the left eye 3 (three) times daily.     Cholecalciferol (VITAMIN D) 50  MCG (2000 UT) tablet 1 tablet     clonazePAM (KLONOPIN) 0.5 MG tablet Take 1 tablet (0.5 mg total) by mouth at bedtime. 30 tablet 5   EPINEPHrine 0.3 mg/0.3 mL IJ SOAJ injection See admin instructions.     estradiol (ESTRACE) 0.1 MG/GM vaginal cream Insert pea-sized amount nightly for 2 weeks then every other night 42.5 g 1   famotidine (PEPCID) 20 MG tablet TAKE 1 TABLET BY MOUTH TWICE A DAY 180 tablet 1   gabapentin (NEURONTIN) 100 MG capsule Take 2 capsules (200 mg total) by mouth at bedtime. 180 capsule 3   gabapentin (NEURONTIN) 600 MG tablet Take 600 mg by mouth 3 (three) times daily.     lidocaine (LIDODERM) 5 % PLACE 1 PATCH ONTO THE SKIN DAILY. REMOVE & DISCARD PATCH WITHIN 12 HOURS OR AS DIRECTED BY MD (Patient taking differently: Place 1 patch onto the skin as needed. Remove & Discard patch within 12 hours or as directed by MD) 30 patch 0   meloxicam (MOBIC) 15 MG tablet Take 1 tablet (15 mg total) by mouth daily. 30 tablet 2   methocarbamol (ROBAXIN) 500 MG tablet Take 1 tablet (500 mg total) by mouth every 8 (eight) hours as needed for muscle spasms. 90 tablet 11   Multiple Vitamin (MULTI-VITAMINS) TABS Take 1 tablet by mouth daily.      nebivolol (BYSTOLIC) 5 MG tablet TAKE 1 TABLET BY MOUTH EVERY DAY 90 tablet 3   Nitroglycerin (RECTIV) 0.4 % OINT Place 0.4 inches rectally 2 (two) times daily as needed (For anal fissures.). 30 g 1   ondansetron (ZOFRAN ODT) 4 MG disintegrating tablet Take 1 tablet (4 mg total) by mouth every 8 (eight) hours as needed for nausea or vomiting. 20 tablet 1   ondansetron (ZOFRAN) 8 MG tablet Take 1 tablet (8 mg total) by mouth every 8 (eight) hours as needed for nausea or vomiting. 20 tablet 1   polyethylene glycol powder (GLYCOLAX/MIRALAX) 17 GM/SCOOP powder See admin instructions.     primidone (MYSOLINE) 50 MG tablet 1 tablet     Travoprost, BAK Free, (TRAVATAN) 0.004 % SOLN ophthalmic solution INSTILL 1 DROP INTO BOTH EYES AT BEDTIME 5 mL 3   vitamin  B-12 (CYANOCOBALAMIN) 1000 MCG tablet Take 1 tablet (1,000 mcg total) by mouth daily. 30 tablet 3   zinc gluconate 50 MG tablet Take 50 mg by mouth daily.     [DISCONTINUED] primidone (MYSOLINE) 50 MG tablet TAKE 1 TABLET IN MORNING AND 3 TABLETS AT BEDTIME 120 tablet 0   No current facility-administered medications on file prior to visit.    ALLERGIES: Allergies  Allergen Reactions   Bee Venom Anaphylaxis and Other (See Comments)   Penicillins Other (See Comments) and  Swelling    Reaction unknown from childhood Has patient had a PCN reaction causing immediate rash, facial/tongue/throat swelling, SOB or lightheadedness with hypotension: unknown Has patient had a PCN reaction causing severe rash involving mucus membranes or skin necrosis: unknown  Has patient had a PCN reaction that required hospitalization: no Has patient had a PCN reaction occurring within the last 10 years: no If all of the above answers are "NO", then may proceed with Cephalosporin use.  Other reaction(s): Other, Unknown Throat swells Throat swells Reaction unknown from childhood Has patient had a PCN reaction causing immediate rash, facial/tongue/throat swelling, SOB or lightheadedness with hypotension: unknown Has patient had a PCN reaction causing severe rash involving mucus membranes or skin necrosis: unknown  Has patient had a PCN reaction that required hospitalization: no Has patient had a PCN reaction occurring within the last 10 years: no If all of the above answers are "NO", then may proceed with Cephalosporin use.    Dextromethorphan     Other reaction(s): Lump in throat   Dextromethorphan Polistirex Er Other (See Comments)    Pt states caused "a lump" in her throat, making it difficult to swallow   Fluconazole     Primidone interaction Other reaction(s): Primidone interaction   Hydrocodone Nausea And Vomiting and Nausea Only    Other reaction(s): nausea and vomiting   Oxycodone-Acetaminophen Other  (See Comments)    Made fingers tingle and go numb, started "feeling weird".  Other reaction(s): Other Made fingers tingle and go numb, started "feeling weird".  Other reaction(s): tingle/numb fingers   Tape Itching and Other (See Comments)    Bandaids - leaves red marks Other reaction(s): itching    FAMILY HISTORY: Family History  Problem Relation Age of Onset   Emphysema Mother        smoker   Allergies Mother    Asthma Mother    Heart disease Mother        AMI as cause of death   COPD Mother    Asthma Father    Heart disease Father        AMI as cause of death   Diabetes Father    Asthma Brother    Heart disease Brother 4       heart failure   Lung cancer Maternal Grandfather        was a smoker   Cancer Maternal Grandfather        lung   Heart disease Paternal Grandmother    Heart disease Paternal Grandfather    Colon cancer Neg Hx       Objective:  Blood pressure 119/75, pulse 85, resp. rate 20, height '5\' 7"'$  (1.702 m), weight 168 lb (76.2 kg), last menstrual period 10/11/2010, SpO2 95 %. General: No acute distress.  Patient appears well-groomed.   Head:  Normocephalic/atraumatic Eyes:  Fundi examined but not visualized Heart:  Regular rate and rhythm Neurological Exam: alert and oriented to person, place, and time.  Speech fluent and not dysarthric, language intact.  CN II-XII intact. Bulk and tone normal, muscle strength 5/5 throughout except left lower extremity limited by knee pain.  Sensation to light touch intact.  Deep tendon reflexes 2+ throughout.  Finger to nose testing intact.  Antalgic gait.  Uses cane for assistance.  Romberg negative   Metta Clines, DO  CC: Waunita Schooner, MD

## 2021-11-30 ENCOUNTER — Encounter: Payer: Self-pay | Admitting: Neurology

## 2021-11-30 ENCOUNTER — Ambulatory Visit: Payer: BC Managed Care – PPO | Admitting: Neurology

## 2021-11-30 VITALS — BP 119/75 | HR 85 | Resp 20 | Ht 67.0 in | Wt 168.0 lb

## 2021-11-30 DIAGNOSIS — G250 Essential tremor: Secondary | ICD-10-CM

## 2021-11-30 MED ORDER — PRIMIDONE 50 MG PO TABS: ORAL_TABLET | ORAL | 11 refills | Status: DC

## 2021-11-30 NOTE — Addendum Note (Signed)
Addended byTomi Likens, Ophie Burrowes R on: 11/30/2021 03:46 PM   Modules accepted: Orders

## 2021-12-09 ENCOUNTER — Other Ambulatory Visit: Payer: Self-pay | Admitting: Neurology

## 2022-01-04 NOTE — Progress Notes (Signed)
Subjective:    Patient ID: Sharon [name], female    DOB: 09-07-1965, 56 y.o.   MRN: [id number]  HPI  Sharon [name] is a 56 year old woman who presents for f/u bilateral knee pain following left knee replacement in 04/2019.   1) Bilateral knee pain L>R: -right knee pain has been getting worse -Her ROM is still restricted in her left knee.  -She asks whether the scar tissue will ever heal.  -Cannot sleep without a pillow underneath her legs.  -She has follow-up with Dr. Mayer Camel and Dr. Ninfa Linden -Currently is not planning to pursue surgery.  -She has been off hydrocodone.  -She would be interested in increasing the Gabapentin dose.  -She would like to discuss an option for inflammation. She has tried aspirations but the fluid comes back right away.  -She got a  Steroid injection and experienced worsening range of motion in her left knee- that was from Dr. Mayer Camel. He also pulled fluid off. It does help with pain, but she does not want another given her worsening range of motion afterward. She does have improved sensation in her knee after the injection! -has been hurting -discussed Qutenza as a treatment option -her current regimen is working for her.  -has been having digestive issues and stopped meloxicam.   2) Anxiety: This has much improved with the Gabapentin.  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  -she has used blue emu oil and diclofenac gel without great benefit -has appointment with physician today.  -she does use her hands a lot.  -she has been dropping things recently.   9) lower back pain -got Xrs and was shown to have cyst on right side and bulging disc on left side and  pinched nerve in the middle.  -she feels a constant burning -she had an injection by Dr. Jacelyn Grip at orthopedics but these did not help.  -she know has a bone further up that is bothering her.  -left pain in her boy friend's car so had a hard time getting in today -hurts severely sometimes -pinches when she walks -sitting also hurts -hurt it while bending and turning -has been worse since she carried her grandchild -she is unable to vacuum.  -discussed therapy and using lidocaine patch, massage.  -she is ok using gabapentin and her muscle relaxers.  -found lidocaine patch to be a lifesaver while she was at the beach.  -ready to try Qutenza today -has been severe recently -plans to follow-up with her orthopedic surgeon.   10) Constipation -has been needing to take laxatives.   11) shortness of breath -worried abut blot clots  Prior history:  Since last visit she has stopped Norco as it caused respiratory distress. We tried Cymbalta '40mg'$  without benefit or side effects. She has tried voltaren gel, blue emu oil without benefit. She has tried CBD.   Her daughter was hospitalized with COVID pneumonia and is 8 months pregnant.   She still has a fever of 99.3. She took an antibiotic for ear pain and this helped her symptoms. It also helped her breathing.   Current dose of Norco is working well for her. It causes her to have hot flashes but she would still  like to continue with this dose.  Discussed Sprint PNS with her again and she still does not like the thought of this option.  She did feel that some of the scar tissue has broken.   Prior history: She has been trying to improve quadriceps musculature. She discusses some of the exercises she does.   She has been denied in her disability. She has talked to an attorney.  She continues to ambulate with a cane.   She has tried the compounding cream and that does not help much. She has been trying Hemp cream which is helping.  She is  getting foot insoles from the podiatrist. They should be ready in a week or two.   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   Can ride the stationary bicycle and that does not hurt. She sues it every day. She uses leg weights. When   Scar tissue grew back quickly post-surgery. She could not get PT appointments for 3 weeks.   Her surgeon does not want her to break the hardware and he may have to do arthroscopic procedure to flush out the inflammation.   She has tried ice which stopped helping, nabumetone 750 mg BID, Norco 1-2 tablets three times per day PRN (she has 12 left). She uses diclofenac gel   She takes Miralax for constipation. She takes up to 2 times per day. She has a history of IBS.   [facility] in [place]- she was a Research scientist (physical sciences). They let her go because she did not return in 12 weeks.   She was doing pool therapy but chemicals in the pool were increased because of COVID.  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 Notes reviewed- recently had a right subacromial injection.   Current pain is 8/10.   12) Hip pain   Pain Inventory Average Pain 7 Pain Right Now 8 My pain is sharp, burning, dull, stabbing, and aching  In the last 24 hours, has pain interfered with the following? General activity 8 Relation with others 8 Enjoyment of life 9 What TIME of day is your pain at its worst?  daytime, evening Sleep (in general) Good  Pain is worse with: walking, bending, sitting, inactivity, and standing Pain improves with: rest, heat/ice and medication Relief from Meds: 7     Family History  Problem Relation Age of Onset   Emphysema Mother        smoker   Allergies Mother    Asthma Mother    Heart disease Mother        AMI as cause of death   COPD Mother    Asthma Father    Heart disease Father        AMI as cause of death   Diabetes Father     Asthma Brother    Heart disease Brother 20       heart failure   Lung cancer Maternal Grandfather        was a smoker   Cancer Maternal Grandfather        lung   Heart disease Paternal Grandmother    Heart disease Paternal Grandfather    Colon cancer Neg Hx    Social History   Socioeconomic History   Marital status: Divorced    Spouse name: Not on file   Number of children: 1   Years of  education: Not on file   Highest education level: Not on file  Occupational History   Occupation: Restaurant manager, fast food: [employer]  Tobacco Use   Smoking status: Never   Smokeless tobacco: Never  Vaping Use   Vaping Use: Never used  Substance and Sexual Activity   Alcohol use: Not Currently    Comment: occ   Drug use: No    Comment: former maryjuana use- quit in 1995   Sexual activity: Yes    Birth control/protection: Surgical  Other Topics Concern   Not on file  Social History Narrative   Marital status: divorced x 2; dating seriously x 3 years.        Children:  1 daughter (51); no grandchildren      Lives:  With boyfriend.        Employment: works at [employer]: none      Alcohol: rarely; socially      Exercise: none; has treadmill and elliptical and bikes   Coffee in am / 1/2 of coke in afternoon    High school education      01/29/20   From: the area   Living: alone - but boyfriend living with her Wilfred Lacy (2020) but knew each other in high school   Work: on disability - working on appeal      Family: one daughter [name] - one grandchild coming in January       Enjoys: relax and watch tv, use to like fishing/boating/rafting, reading, crochet       Exercise: rehab exercises   Diet: not great - limited intake      Safety   Seat belts: Yes    Guns: Yes  and secure   Right handed   One story home   Drinks caffeine   Social Determinants of Health   Financial Resource Strain: Not on file  Food Insecurity: Not on file   Transportation Needs: Not on file  Physical Activity: Not on file  Stress: Not on file  Social Connections: Not on file   Past Surgical History:  Procedure Laterality Date   ABDOMINAL HYSTERECTOMY     CATARACT EXTRACTION Bilateral 2015   COLONOSCOPY     CYSTOSCOPY W/ URETERAL STENT PLACEMENT Right 05/23/2017   Procedure: CYSTOSCOPY WITH RETROGRADE PYELOGRAM/URETERAL RIGHT STENT PLACEMENT;  Surgeon: Bjorn Loser, MD;  Location: [facility];  Service: Urology;  Laterality: Right;   CYSTOSCOPY W/ URETERAL STENT PLACEMENT Right 06/04/2017   Procedure: CYSTOSCOPY WITHRIGHT RETROGRADE PYELOGRAMRIGHT /URETEREOSCOPY  STENT PLACEMENT;  Surgeon: Lucas Mallow, MD;  Location: [facility];  Service: Urology;  Laterality: Right;   ENDOMETRIAL ABLATION     EYE SURGERY Bilateral    cataract surgery with lens implants   HEMORRHOID SURGERY  2011   INCONTINENCE SURGERY     INNER EAR SURGERY     X 2   KNEE SURGERY     RECTAL PROLAPSE REPAIR     RETINAL DETACHMENT SURGERY Bilateral 2000   RETINAL DETACHMENT SURGERY Bilateral    TOTAL KNEE ARTHROPLASTY Left 04/29/2019   TOTAL KNEE ARTHROPLASTY Left 04/29/2019   Procedure: LEFT TOTAL KNEE ARTHROPLASTY;  Surgeon: Mcarthur Rossetti, MD;  Location: [facility];  Service: Orthopedics;  Laterality: Left;   TUBAL LIGATION     VEIN SURGERY Left 04/2010   Past Medical History:  Diagnosis Date   Allergy    Anal fissure    Anemia  borderline   Anxiety    Arthritis    Blood clot in vein    left leg   Detached retina    BOTH SCLERA BUCKLE BOTH EYES   DVT (deep venous thrombosis) (HCC)    left leg   Endometriosis    Family history of adverse reaction to anesthesia    DAUGHTER POST OP PONV   GERD (gastroesophageal reflux disease)    Glaucoma    RIGHT WORST THAN LEFT   Heart murmur    "caused by anxiety"    History of hemorrhoids    History of kidney stones    Hypertension    IBS (irritable bowel syndrome)    Perforated  eardrum, right    SMALL HOLE   PONV (postoperative nausea and vomiting)    Pulmonary embolism (HCC) 6 YRS AGO   LMP 10/11/2010   Opioid Risk Score:   Fall Risk Score:  `1  Depression screen [facility] 2/9     10/12/2021   11:35 AM 08/02/2021   11:31 AM 04/13/2021   12:05 PM 04/08/2021    2:57 PM 10/07/2020    2:29 PM 04/09/2020    3:03 PM 01/29/2020   11:34 AM  Depression screen PHQ 2/9  Decreased Interest '3 1 2 '$ 0 3 0 3  Down, Depressed, Hopeless '1 1 2 '$ 0 2 0 2  PHQ - 2 Score '4 2 4 '$ 0 5 0 5  Altered sleeping 1  2  0  2  Tired, decreased energy '3  2  2  2  '$ Change in appetite 0  '2  3  2  '$ Feeling bad or failure about yourself  0  0  0  0  Trouble concentrating 0  0  0  1  Moving slowly or fidgety/restless 0  0  0  0  Suicidal thoughts 0  0  0  0  PHQ-9 Score '8  10  10  12  '$ Difficult doing work/chores Somewhat difficult  Extremely dIfficult  Somewhat difficult  Very difficult   Review of Systems  Constitutional: Negative.        Weight loss  HENT: Negative.   Eyes: Negative.   Respiratory: Positive for shortness of breath.   Cardiovascular: Negative.   Gastrointestinal: Negative.   Endocrine: Negative.   Genitourinary: Negative.   Musculoskeletal: Positive for arthralgias, back pain and gait problem.       Spasms  Skin: Negative.   Allergic/Immunologic: Negative.   Neurological: Positive for tremors and numbness.  Hematological: Negative.   Psychiatric/Behavioral:       Anxiety  All other systems reviewed and are negative.      Objective:   Physical Exam Gen: no distress, normal appearing HEENT: oral mucosa pink and moist, NCAT Cardio: Reg rate Chest: normal effort, normal rate of breathing Abd: soft, non-distended Ext: no edema Psych: pleasant, normal affect Skin: intact Musculoskeletal: Atrophy of left thigh, swelling and bruising on left knee. Not tight and full currently. Right knee is also tender to palpation. Palable trigger points in her upper and lower back.  +Kemp's test bilaterally.  Neuro: Ambulating with cane with antalgic gait. +Phalen's test bilaterally Psych: pleasant, normal affect, in positive spirits.     Assessment & Plan:  Sharon [name] is a 56 year old woman who presents for follow-up:   1) Left knee pain post-surgery: -She has a plan for repeat surgical procedure with Dr. Ninfa Linden that will hopefully provide relief.  -She has been icing regularly- recommended three tines  per day for 15 minutes.  -She has been through physical therapy and performs her exercises diligently. --Discussed Sprint PNS system as an option of pain treatment via neuromodulation. Provided following link for patient to learn more about the system: https://www.sprtherapeutics.com/. She does not like the feeling of things under her skin and defers that option.  -She weaned herself all the steroids.  -discussed mechanism of action of low dose naltrexone as an opioid receptor antagonist which stimulates your body's production of its own natural endogenous opioids, helping to decrease pain. Discussed that it can also decrease T cell response and thus be helpful in decreasing inflammation, and symptoms of brain fog, fatigue, anxiety, depression, and allergies. Discussed that this medication needs to be compounded at a compounding pharmacy and can more expensive. Discussed that I usually start at '1mg'$  and if this is not providing enough relief then I titrate upward on a monthly basis.   -Discussed Qutenza as an option for neuropathic pain control. Discussed that this is a capsaicin patch, stronger than capsaicin cream. Discussed that it is currently approved for diabetic peripheral neuropathy and post-herpetic neuralgia, but that it has also shown benefit in treating other forms of neuropathy. Provided patient with link to site to learn more about the patch: CinemaBonus.fr. Discussed that the patch would be placed in office and benefits usually last 3 months. Discussed  that unintended exposure to capsaicin can cause severe irritation of eyes, mucous membranes, respiratory tract, and skin, but that Qutenza is a local treatment and does not have the systemic side effects of other nerve medications. Discussed that there may be pain, itching, erythema, and decreased sensory function associated with the application of Qutenza. Side effects usually subside within 1 week. A cold pack of analgesic medications can help with these side effects. Blood pressure can also be increased due to pain associated with administration of the patch.     -Refilled Robaxin as she is nervous about feeling too sleepy with the Tizanidine, and the Robaxin does help.   -Continue abapentin to '600mg'$  TID PRN.   -Continue neoprene knee sleeve for proprioception  -She has tried Celebrex and Tramadol without benefit. She had respiratory distress with hydrocodone.   -She had worse pain with steroid injection in her left knee.   -Discussed steroids but she did not like the side effect profile. Discussed the different steroid options. She weaned off these because she developed mouth sores.   -Continue turmeric and cherries.   -Continue Lidocaine patches today.   -She would like rheum panel checked: ordered, reviewed results which were negative, and discussed with patient.  -Refilled meloxicam.   2) Bunion:  -Referred to podiatry for bunion and orthotic fitting. She will be receiving these in 2 weeks.   3) Irritable bowel syndrome: -She does not eat much sugar or sweets.  -She drinks 1 coke per day.  -She cuts tea out.  -She drinks water, coffee.  -She has been going more regularly and has not needed her Miralax.   4) Impaired Concentration: - This persists -She forgets things easily.  -She tries not to multitask when she was with her grandmother.   5) Anxiety: The Gabapentin is helping with this as well, continue  6) Vulvodynia: -discussed various treatment options: biofeedback,  medications, continue estradiol.  -continue Amitriptyline '10mg'$  HS, discussed moving dose earlier in the evening.   7) Cervical and lumbar myofascial pain: -try massage -discussed benefits of therapy and trigger point injections -apply lidocaine patch.   8) Bilateral  hand pain: likely secondary to arthritis compounded by carpal tunnel syndrome -continue Gabapentin which is helping -continue f/u with surgery as planned.   9) Low back pain, appears to be secondary to face arthritis -discussed XR, but she defers due to potential cost -discussed lack of improvement with injections from Dr. Jacelyn Grip.  -continue lidocaine patches -continue postural correction -Provided with a pain relief journal and discussed that it contains foods and lifestyle tips to naturally help to improve pain. Discussed that these lifestyle strategies are also very good for health unlike some medications which can have negative side effects. Discussed that the act of keeping a journal can be therapeutic and helpful to realize patterns what helps to trigger and alleviate pain.   -Discussed following foods that may reduce pain: 1) Ginger (especially studied for arthritis)- reduce leukotriene production to decrease inflammation 2) Blueberries- high in phytonutrients that decrease inflammation 3) Salmon- marine omega-3s reduce joint swelling and pain 4) Pumpkin seeds- reduce inflammation 5) dark chocolate- reduces inflammation 6) turmeric- reduces inflammation 7) tart cherries - reduce pain and stiffness 8) extra virgin olive oil - its compound olecanthal helps to block prostaglandins  9) chili peppers- can be eaten or applied topically via capsaicin 10) mint- helpful for headache, muscle aches, joint pain, and itching 11) garlic- reduces inflammation  Link to further information on diet for chronic pain: http://www.randall.com/   10) shoulder pain -used  to get shots in the pt, but laying on her back has helped to decrease her shoulder pain  11) Acid reflux -continues famotidine.   11) Soreness in legs -vascular ultrasound ordered of both legs -discussed her history of clots -discussed that a D-dimer test can suggest clot if elevated.   12) Irritable bowel syndrome -discussed her current diet -discussed that she does not eat breakfast due to nausea -discussed her diarrhea and constipation, more diarrheal type at this time.  -discussed her aunt's advise to have a tsp coconut oil daily.    40 minutes spent in discussion of her knee and back pain, her irritable bowel syndrome her inability to cook since she can't stand long enough, her current diet, her history of kidney stones and clots, her lack of response to spinal injections, her impaired mobility due to her pain, discussed her allergies to oxycodone and hydrocodone, that tramadol helps with cough, sleepiness with gabapentin, discussed low dose naltrexone, discussed lidocaine patches

## 2022-01-09 ENCOUNTER — Encounter
Payer: No Typology Code available for payment source | Attending: Physical Medicine and Rehabilitation | Admitting: Physical Medicine and Rehabilitation

## 2022-01-09 DIAGNOSIS — M5136 Other intervertebral disc degeneration, lumbar region: Secondary | ICD-10-CM | POA: Diagnosis not present

## 2022-01-09 DIAGNOSIS — M51369 Other intervertebral disc degeneration, lumbar region without mention of lumbar back pain or lower extremity pain: Secondary | ICD-10-CM

## 2022-01-09 DIAGNOSIS — M25559 Pain in unspecified hip: Secondary | ICD-10-CM

## 2022-01-09 DIAGNOSIS — Z86718 Personal history of other venous thrombosis and embolism: Secondary | ICD-10-CM | POA: Diagnosis present

## 2022-01-09 DIAGNOSIS — K589 Irritable bowel syndrome without diarrhea: Secondary | ICD-10-CM

## 2022-01-09 DIAGNOSIS — G8929 Other chronic pain: Secondary | ICD-10-CM | POA: Diagnosis present

## 2022-01-09 DIAGNOSIS — M79662 Pain in left lower leg: Secondary | ICD-10-CM | POA: Diagnosis not present

## 2022-01-09 DIAGNOSIS — R0602 Shortness of breath: Secondary | ICD-10-CM | POA: Diagnosis present

## 2022-01-09 DIAGNOSIS — M25562 Pain in left knee: Secondary | ICD-10-CM | POA: Insufficient documentation

## 2022-01-09 MED ORDER — GABAPENTIN 600 MG PO TABS: 600.0000 mg | ORAL_TABLET | Freq: Three times a day (TID) | ORAL | 3 refills | Status: DC

## 2022-01-09 NOTE — Addendum Note (Signed)
Addended by: Izora Ribas on: 01/09/2022 03:13 PM   Modules accepted: Level of Service

## 2022-01-09 NOTE — Addendum Note (Signed)
Addended by: Izora Ribas on: 01/09/2022 02:58 PM   Modules accepted: Level of Service

## 2022-01-09 NOTE — Patient Instructions (Signed)
-  discussed mechanism of action of low dose naltrexone as an opioid receptor antagonist which stimulates your body's production of its own natural endogenous opioids, helping to decrease pain. Discussed that it can also decrease T cell response and thus be helpful in decreasing inflammation, and symptoms of brain fog, fatigue, anxiety, depression, and allergies. Discussed that this medication needs to be compounded at a compounding pharmacy and can more expensive. Discussed that I usually start at '1mg'$  and if this is not providing enough relief then I titrate upward on a monthly basis.    Foods that may reduce pain: 1) Ginger (especially studied for arthritis)- reduce leukotriene production to decrease inflammation 2) Blueberries- high in phytonutrients that decrease inflammation 3) Salmon- marine omega-3s reduce joint swelling and pain 4) Pumpkin seeds- reduce inflammation 5) dark chocolate- reduces inflammation 6) turmeric- reduces inflammation 7) tart cherries - reduce pain and stiffness 8) extra virgin olive oil - its compound olecanthal helps to block prostaglandins  9) chili peppers- can be eaten or applied topically via capsaicin 10) mint- helpful for headache, muscle aches, joint pain, and itching 11) garlic- reduces inflammation  Link to further information on diet for chronic pain: http://www.randall.com/

## 2022-01-11 NOTE — Addendum Note (Signed)
Addended by: Izora Ribas on: 01/11/2022 11:19 AM   Modules accepted: Orders

## 2022-01-17 ENCOUNTER — Ambulatory Visit (HOSPITAL_COMMUNITY)
Admission: RE | Admit: 2022-01-17 | Discharge: 2022-01-17 | Disposition: A | Discharge status: Home / Self Care | Payer: No Typology Code available for payment source | Source: Ambulatory Visit | Attending: Physical Medicine and Rehabilitation | Admitting: Physical Medicine and Rehabilitation

## 2022-01-17 DIAGNOSIS — R0602 Shortness of breath: Secondary | ICD-10-CM | POA: Diagnosis present

## 2022-01-17 DIAGNOSIS — Z86718 Personal history of other venous thrombosis and embolism: Secondary | ICD-10-CM

## 2022-01-17 MED ORDER — IOHEXOL 350 MG/ML SOLN
70.0000 mL | Freq: Once | INTRAVENOUS | Status: AC | PRN
Start: 2022-01-17 — End: 2022-01-17
  Administered 2022-01-17: 70 mL via INTRAVENOUS

## 2022-01-19 ENCOUNTER — Other Ambulatory Visit: Payer: Self-pay | Admitting: Physical Medicine and Rehabilitation

## 2022-01-19 DIAGNOSIS — G4700 Insomnia, unspecified: Secondary | ICD-10-CM

## 2022-01-20 ENCOUNTER — Ambulatory Visit (HOSPITAL_COMMUNITY)
Admission: RE | Admit: 2022-01-20 | Discharge: 2022-01-20 | Disposition: A | Discharge status: Home / Self Care | Payer: No Typology Code available for payment source | Source: Ambulatory Visit | Attending: Physical Medicine and Rehabilitation | Admitting: Physical Medicine and Rehabilitation

## 2022-01-20 DIAGNOSIS — R609 Edema, unspecified: Secondary | ICD-10-CM

## 2022-01-20 DIAGNOSIS — M79662 Pain in left lower leg: Secondary | ICD-10-CM | POA: Insufficient documentation

## 2022-01-20 NOTE — Progress Notes (Signed)
Bilateral lower extremity venous duplex completed. Refer to "CV Proc" under chart review to view preliminary results.  01/20/2022 1:13 PM Kelby Aline., MHA, RVT, RDCS, RDMS

## 2022-01-24 ENCOUNTER — Telehealth: Payer: No Typology Code available for payment source | Admitting: Physical Medicine and Rehabilitation

## 2022-01-24 ENCOUNTER — Encounter
Payer: No Typology Code available for payment source | Attending: Physical Medicine and Rehabilitation | Admitting: Physical Medicine and Rehabilitation

## 2022-01-24 DIAGNOSIS — R0602 Shortness of breath: Secondary | ICD-10-CM | POA: Insufficient documentation

## 2022-01-24 DIAGNOSIS — Z86718 Personal history of other venous thrombosis and embolism: Secondary | ICD-10-CM | POA: Diagnosis not present

## 2022-01-24 DIAGNOSIS — G4700 Insomnia, unspecified: Secondary | ICD-10-CM | POA: Insufficient documentation

## 2022-01-24 NOTE — Progress Notes (Signed)
Subjective:    Patient ID: Sharon [name], female    DOB: September 11, 1965, 56 y.o.   MRN: [id number]  HPI  An audio/video tele-health visit is felt to be the most appropriate encounter for this patient at this time. This is a follow up tele-visit via phone. The patient is at home. MD is at office. Prior to scheduling this appointment, our staff discussed the limitations of evaluation and management by telemedicine and the availability of in-person appointments. The patient expressed understanding and agreed to proceed.   Sharon [name] is a 56 year old woman who presents for f/u bilateral knee pain following left knee replacement in 04/2019, as well as shortness of breath.   1) Bilateral knee pain L>R: -right knee pain has been getting worse -Her ROM is still restricted in her left knee.  -She asks whether the scar tissue will ever heal.  -Cannot sleep without a pillow underneath her legs.  -She has follow-up with Dr. Mayer Camel and Dr. Ninfa Linden -Currently is not planning to pursue surgery.  -She has been off hydrocodone.  -She would be interested in increasing the Gabapentin dose.  -She would like to discuss an option for inflammation. She has tried aspirations but the fluid comes back right away.  -She got a  Steroid injection and experienced worsening range of motion in her left knee- that was from Dr. Mayer Camel. He also pulled fluid off. It does help with pain, but she does not want another given her worsening range of motion afterward. She does have improved sensation in her knee after the injection! -has been hurting -discussed Qutenza as a treatment option -her current regimen is working for her.  -has been having digestive issues and stopped meloxicam.   2) Anxiety: This has much improved with the Gabapentin.  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  -she has used blue emu oil and diclofenac gel without great benefit -has appointment with physician today.  -she does use her hands a lot.  -she has been dropping things recently.   9) lower back pain -got Xrs and was shown to have cyst on right side and bulging disc on left side and pinched nerve in the middle.  -she feels a constant burning -she had an injection by Dr. Jacelyn Grip at orthopedics but these did not help.  -she know has a bone further up that is bothering her.  -left pain in her boy friend's car so had a hard time getting in today -hurts severely sometimes -pinches when she walks -sitting also hurts -hurt it while bending and turning -has been worse since she carried her grandchild -she is unable to vacuum.  -discussed therapy and using lidocaine patch, massage.  -she is ok using gabapentin and her muscle relaxers.  -found lidocaine patch to be a lifesaver while she was at the beach.  -ready to try Qutenza today -has been severe recently -plans to follow-up with her orthopedic surgeon.   10) Constipation -has been needing to take laxatives.   11) shortness of breath -worried abut blot clots -she asks about results of her vascular ultrasound of her lower extremity and what treatment is necessary, how she would know if there are clots elsewhere in her  body, and how she may have developed these clots.   Prior history:  Since last visit she has stopped Norco as it caused respiratory distress. We tried Cymbalta '40mg'$  without benefit or side effects. She has tried voltaren gel, blue emu oil without benefit. She has tried CBD.   She has been trying to improve quadriceps musculature. She discusses some of the exercises she does.   She has been denied in her disability. She has talked to an attorney.  She continues to  ambulate with a cane.   She has tried the compounding cream and that does not help much. She has been trying Hemp cream which is helping.  She is getting foot insoles from the podiatrist. They should be ready in a week or two.   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   Can ride the stationary bicycle and that does not hurt. She sues it every day. She uses leg weights. When   Scar tissue grew back quickly post-surgery. She could not get PT appointments for 3 weeks.   Her surgeon does not want her to break the hardware and he may have to do arthroscopic procedure to flush out the inflammation.   She has tried ice which stopped helping, nabumetone 750 mg BID, Norco 1-2 tablets three times per day PRN (she has 12 left). She uses diclofenac gel   She takes Miralax for constipation. She takes up to 2 times per day. She has a history of IBS.   [facility] in [place]- she was a Research scientist (physical sciences). They let her go because she did not return in 12 weeks.   She was doing pool therapy but chemicals in the pool were increased because of COVID.  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 Notes reviewed- recently had a right subacromial injection.   Current pain is 8/10.   12) Hip pain   Pain Inventory Average Pain 7 Pain Right Now 8 My pain is sharp, burning, dull, stabbing, and aching  In the last 24 hours, has pain interfered with the following? General activity 8 Relation with others 8 Enjoyment of life 9 What TIME of day is your pain at its worst?  daytime, evening Sleep (in general) Good  Pain is worse with: walking, bending, sitting, inactivity, and standing Pain improves with: rest, heat/ice and medication Relief from Meds: 7     Family History  Problem Relation Age of Onset   Emphysema Mother        smoker   Allergies Mother    Asthma Mother    Heart  disease Mother        AMI as cause of death   COPD Mother    Asthma Father    Heart disease Father        AMI as cause of death   Diabetes Father    Asthma Brother    Heart disease Brother 78       heart failure   Lung cancer Maternal Grandfather        was a smoker   Cancer Maternal Grandfather        lung   Heart disease Paternal Grandmother    Heart disease Paternal Grandfather    Colon cancer Neg Hx    Social History   Socioeconomic History   Marital status: Divorced    Spouse name: Not on  file   Number of children: 1   Years of education: Not on file   Highest education level: Not on file  Occupational History   Occupation: Restaurant manager, fast food: [employer]  Tobacco Use   Smoking status: Never   Smokeless tobacco: Never  Vaping Use   Vaping Use: Never used  Substance and Sexual Activity   Alcohol use: Not Currently    Comment: occ   Drug use: No    Comment: former maryjuana use- quit in 1995   Sexual activity: Yes    Birth control/protection: Surgical  Other Topics Concern   Not on file  Social History Narrative   Marital status: divorced x 2; dating seriously x 3 years.        Children:  1 daughter (53); no grandchildren      Lives:  With boyfriend.        Employment: works at [employer]: none      Alcohol: rarely; socially      Exercise: none; has treadmill and elliptical and bikes   Coffee in am / 1/2 of coke in afternoon    High school education      01/29/20   From: the area   Living: alone - but boyfriend living with her Wilfred Lacy (2020) but knew each other in high school   Work: on disability - working on appeal      Family: one daughter [name] - one grandchild coming in January       Enjoys: relax and watch tv, use to like fishing/boating/rafting, reading, crochet       Exercise: rehab exercises   Diet: not great - limited intake      Safety   Seat belts: Yes    Guns: Yes  and secure   Right handed    One story home   Drinks caffeine   Social Determinants of Health   Financial Resource Strain: Not on file  Food Insecurity: Not on file  Transportation Needs: Not on file  Physical Activity: Not on file  Stress: Not on file  Social Connections: Not on file   Past Surgical History:  Procedure Laterality Date   ABDOMINAL HYSTERECTOMY     CATARACT EXTRACTION Bilateral 2015   COLONOSCOPY     CYSTOSCOPY W/ URETERAL STENT PLACEMENT Right 05/23/2017   Procedure: CYSTOSCOPY WITH RETROGRADE PYELOGRAM/URETERAL RIGHT STENT PLACEMENT;  Surgeon: Bjorn Loser, MD;  Location: [facility];  Service: Urology;  Laterality: Right;   CYSTOSCOPY W/ URETERAL STENT PLACEMENT Right 06/04/2017   Procedure: CYSTOSCOPY WITHRIGHT RETROGRADE PYELOGRAMRIGHT /URETEREOSCOPY  STENT PLACEMENT;  Surgeon: Lucas Mallow, MD;  Location: [facility];  Service: Urology;  Laterality: Right;   ENDOMETRIAL ABLATION     EYE SURGERY Bilateral    cataract surgery with lens implants   HEMORRHOID SURGERY  2011   INCONTINENCE SURGERY     INNER EAR SURGERY     X 2   KNEE SURGERY     RECTAL PROLAPSE REPAIR     RETINAL DETACHMENT SURGERY Bilateral 2000   RETINAL DETACHMENT SURGERY Bilateral    TOTAL KNEE ARTHROPLASTY Left 04/29/2019   TOTAL KNEE ARTHROPLASTY Left 04/29/2019   Procedure: LEFT TOTAL KNEE ARTHROPLASTY;  Surgeon: Mcarthur Rossetti, MD;  Location: [place];  Service: Orthopedics;  Laterality: Left;   TUBAL LIGATION     VEIN SURGERY Left 04/2010   Past Medical History:  Diagnosis Date  Allergy    Anal fissure    Anemia    borderline   Anxiety    Arthritis    Blood clot in vein    left leg   Detached retina    BOTH SCLERA BUCKLE BOTH EYES   DVT (deep venous thrombosis) (HCC)    left leg   Endometriosis    Family history of adverse reaction to anesthesia    DAUGHTER POST OP PONV   GERD (gastroesophageal reflux disease)    Glaucoma    RIGHT WORST THAN LEFT   Heart murmur     "caused by anxiety"    History of hemorrhoids    History of kidney stones    Hypertension    IBS (irritable bowel syndrome)    Perforated eardrum, right    SMALL HOLE   PONV (postoperative nausea and vomiting)    Pulmonary embolism (HCC) 6 YRS AGO   LMP 10/11/2010   Opioid Risk Score:   Fall Risk Score:  `1  Depression screen [facility] 2/9     10/12/2021   11:35 AM 08/02/2021   11:31 AM 04/13/2021   12:05 PM 04/08/2021    2:57 PM 10/07/2020    2:29 PM 04/09/2020    3:03 PM 01/29/2020   11:34 AM  Depression screen PHQ 2/9  Decreased Interest '3 1 2 '$ 0 3 0 3  Down, Depressed, Hopeless '1 1 2 '$ 0 2 0 2  PHQ - 2 Score '4 2 4 '$ 0 5 0 5  Altered sleeping 1  2  0  2  Tired, decreased energy '3  2  2  2  '$ Change in appetite 0  '2  3  2  '$ Feeling bad or failure about yourself  0  0  0  0  Trouble concentrating 0  0  0  1  Moving slowly or fidgety/restless 0  0  0  0  Suicidal thoughts 0  0  0  0  PHQ-9 Score '8  10  10  12  '$ Difficult doing work/chores Somewhat difficult  Extremely dIfficult  Somewhat difficult  Very difficult   Review of Systems  Constitutional: Negative.        Weight loss  HENT: Negative.   Eyes: Negative.   Respiratory: Positive for shortness of breath.   Cardiovascular: Negative.   Gastrointestinal: Negative.   Endocrine: Negative.   Genitourinary: Negative.   Musculoskeletal: Positive for arthralgias, back pain and gait problem.       Spasms  Skin: Negative.   Allergic/Immunologic: Negative.   Neurological: Positive for tremors and numbness.  Hematological: Negative.   Psychiatric/Behavioral:       Anxiety  All other systems reviewed and are negative.      Objective:   Not performed    Assessment & Plan:  Sharon [name] is a 56 year old woman who presents for follow-up:   1) Left knee pain post-surgery: -She has a plan for repeat surgical procedure with Dr. Ninfa Linden that will hopefully provide relief.  -She has been icing regularly- recommended three tines per  day for 15 minutes.  -She has been through physical therapy and performs her exercises diligently. --Discussed Sprint PNS system as an option of pain treatment via neuromodulation. Provided following link for patient to learn more about the system: https://www.sprtherapeutics.com/. She does not like the feeling of things under her skin and defers that option.  -She weaned herself all the steroids.  -discussed mechanism of action of low dose naltrexone as an opioid  receptor antagonist which stimulates your body's production of its own natural endogenous opioids, helping to decrease pain. Discussed that it can also decrease T cell response and thus be helpful in decreasing inflammation, and symptoms of brain fog, fatigue, anxiety, depression, and allergies. Discussed that this medication needs to be compounded at a compounding pharmacy and can more expensive. Discussed that I usually start at '1mg'$  and if this is not providing enough relief then I titrate upward on a monthly basis.   -Discussed Qutenza as an option for neuropathic pain control. Discussed that this is a capsaicin patch, stronger than capsaicin cream. Discussed that it is currently approved for diabetic peripheral neuropathy and post-herpetic neuralgia, but that it has also shown benefit in treating other forms of neuropathy. Provided patient with link to site to learn more about the patch: CinemaBonus.fr. Discussed that the patch would be placed in office and benefits usually last 3 months. Discussed that unintended exposure to capsaicin can cause severe irritation of eyes, mucous membranes, respiratory tract, and skin, but that Qutenza is a local treatment and does not have the systemic side effects of other nerve medications. Discussed that there may be pain, itching, erythema, and decreased sensory function associated with the application of Qutenza. Side effects usually subside within 1 week. A cold pack of analgesic medications can  help with these side effects. Blood pressure can also be increased due to pain associated with administration of the patch.     -Refilled Robaxin as she is nervous about feeling too sleepy with the Tizanidine, and the Robaxin does help.   -Continue abapentin to '600mg'$  TID PRN.   -Continue neoprene knee sleeve for proprioception  -She has tried Celebrex and Tramadol without benefit. She had respiratory distress with hydrocodone.   -She had worse pain with steroid injection in her left knee.   -Discussed steroids but she did not like the side effect profile. Discussed the different steroid options. She weaned off these because she developed mouth sores.   -Continue turmeric and cherries.   -Continue Lidocaine patches today.   -She would like rheum panel checked: ordered, reviewed results which were negative, and discussed with patient.  -Refilled meloxicam.   2) Bunion:  -Referred to podiatry for bunion and orthotic fitting. She will be receiving these in 2 weeks.   3) Irritable bowel syndrome: -She does not eat much sugar or sweets.  -She drinks 1 coke per day.  -She cuts tea out.  -She drinks water, coffee.  -She has been going more regularly and has not needed her Miralax.   4) Impaired Concentration: - This persists -She forgets things easily.  -She tries not to multitask when she was with her grandmother.   5) Anxiety: The Gabapentin is helping with this as well, continue  6) Vulvodynia: -discussed various treatment options: biofeedback, medications, continue estradiol.  -continue Amitriptyline '10mg'$  HS, discussed moving dose earlier in the evening.   7) Cervical and lumbar myofascial pain: -try massage -discussed benefits of therapy and trigger point injections -apply lidocaine patch.   8) Bilateral hand pain: likely secondary to arthritis compounded by carpal tunnel syndrome -continue Gabapentin which is helping -continue f/u with surgery as planned.   9) Low  back pain, appears to be secondary to face arthritis -discussed XR, but she defers due to potential cost -discussed lack of improvement with injections from Dr. Jacelyn Grip.  -continue lidocaine patches -continue postural correction -Provided with a pain relief journal and discussed that it contains foods and lifestyle tips  to naturally help to improve pain. Discussed that these lifestyle strategies are also very good for health unlike some medications which can have negative side effects. Discussed that the act of keeping a journal can be therapeutic and helpful to realize patterns what helps to trigger and alleviate pain.   -Discussed following foods that may reduce pain: 1) Ginger (especially studied for arthritis)- reduce leukotriene production to decrease inflammation 2) Blueberries- high in phytonutrients that decrease inflammation 3) Salmon- marine omega-3s reduce joint swelling and pain 4) Pumpkin seeds- reduce inflammation 5) dark chocolate- reduces inflammation 6) turmeric- reduces inflammation 7) tart cherries - reduce pain and stiffness 8) extra virgin olive oil - its compound olecanthal helps to block prostaglandins  9) chili peppers- can be eaten or applied topically via capsaicin 10) mint- helpful for headache, muscle aches, joint pain, and itching 11) garlic- reduces inflammation  Link to further information on diet for chronic pain: http://www.randall.com/   10) shoulder pain -used to get shots in the pt, but laying on her back has helped to decrease her shoulder pain  11) Acid reflux -continues famotidine.   11) Soreness in legs -vascular ultrasound ordered of both legs -discussed her history of clots -discussed that a D-dimer test can suggest clot if elevated.   12) Irritable bowel syndrome -discussed her current diet -discussed that she does not eat breakfast due to nausea -discussed her diarrhea and  constipation, more diarrheal type at this time.  -discussed her aunt's advise to have a tsp coconut oil daily.    13) Shortness of breath with history of clots -discussed that there is no evidence of PE on her CTA -discussed that her vascular ultrasound of her lower extremities shows a chronic left peroneal CVT  -recommended starting nattokinase to help thin the blood and lower her risk for clots, or to eat natto -discussed the importance of movement in clot prevention -referred for sleep study for shortness of breath  7 minutes spent in discussion of her shortness of breath, review of her ultrasound results with her, recommending natto

## 2022-01-27 ENCOUNTER — Encounter: Payer: Self-pay | Admitting: Neurology

## 2022-01-31 ENCOUNTER — Encounter: Payer: No Typology Code available for payment source | Admitting: Physical Medicine and Rehabilitation

## 2022-01-31 ENCOUNTER — Telehealth: Payer: No Typology Code available for payment source | Admitting: Physical Medicine and Rehabilitation

## 2022-02-01 ENCOUNTER — Encounter: Payer: Self-pay | Admitting: *Deleted

## 2022-02-02 ENCOUNTER — Other Ambulatory Visit: Payer: Self-pay | Admitting: Family Medicine

## 2022-02-02 DIAGNOSIS — I10 Essential (primary) hypertension: Secondary | ICD-10-CM

## 2022-03-03 ENCOUNTER — Other Ambulatory Visit: Payer: Self-pay | Admitting: Physical Medicine and Rehabilitation

## 2022-03-03 ENCOUNTER — Ambulatory Visit: Payer: PRIVATE HEALTH INSURANCE | Admitting: Family

## 2022-03-07 ENCOUNTER — Ambulatory Visit (INDEPENDENT_AMBULATORY_CARE_PROVIDER_SITE_OTHER): Payer: No Typology Code available for payment source | Admitting: Family

## 2022-03-07 ENCOUNTER — Encounter: Payer: Self-pay | Admitting: Family

## 2022-03-07 VITALS — BP 116/70 | HR 65 | Temp 98.6°F | Resp 16 | Ht 67.0 in | Wt 170.0 lb

## 2022-03-07 DIAGNOSIS — Z8742 Personal history of other diseases of the female genital tract: Secondary | ICD-10-CM

## 2022-03-07 DIAGNOSIS — R0689 Other abnormalities of breathing: Secondary | ICD-10-CM

## 2022-03-07 DIAGNOSIS — R350 Frequency of micturition: Secondary | ICD-10-CM | POA: Diagnosis not present

## 2022-03-07 DIAGNOSIS — K582 Mixed irritable bowel syndrome: Secondary | ICD-10-CM

## 2022-03-07 DIAGNOSIS — J4520 Mild intermittent asthma, uncomplicated: Secondary | ICD-10-CM

## 2022-03-07 DIAGNOSIS — T7840XA Allergy, unspecified, initial encounter: Secondary | ICD-10-CM | POA: Diagnosis not present

## 2022-03-07 DIAGNOSIS — N816 Rectocele: Secondary | ICD-10-CM

## 2022-03-07 DIAGNOSIS — R0683 Snoring: Secondary | ICD-10-CM

## 2022-03-07 DIAGNOSIS — R5382 Chronic fatigue, unspecified: Secondary | ICD-10-CM | POA: Insufficient documentation

## 2022-03-07 DIAGNOSIS — R251 Tremor, unspecified: Secondary | ICD-10-CM

## 2022-03-07 DIAGNOSIS — K219 Gastro-esophageal reflux disease without esophagitis: Secondary | ICD-10-CM

## 2022-03-07 DIAGNOSIS — J219 Acute bronchiolitis, unspecified: Secondary | ICD-10-CM

## 2022-03-07 LAB — POC URINALSYSI DIPSTICK (AUTOMATED)
Bilirubin, UA: NEGATIVE
Glucose, UA: NEGATIVE
Ketones, UA: NEGATIVE
Leukocytes, UA: NEGATIVE
Nitrite, UA: NEGATIVE
Protein, UA: NEGATIVE
Spec Grav, UA: 1.01 (ref 1.010–1.025)
Urobilinogen, UA: 0.2 E.U./dL
pH, UA: 6.5 (ref 5.0–8.0)

## 2022-03-07 MED ORDER — PREDNISONE 20 MG PO TABS: ORAL_TABLET | ORAL | 0 refills | Status: DC

## 2022-03-07 MED ORDER — EPINEPHRINE 0.3 MG/0.3ML IJ SOAJ: 0.3000 mg | INTRAMUSCULAR | 0 refills | Status: AC | PRN

## 2022-03-07 MED ORDER — PANTOPRAZOLE SODIUM 40 MG PO TBEC: 40.0000 mg | DELAYED_RELEASE_TABLET | Freq: Every day | ORAL | 1 refills | Status: DC

## 2022-03-07 NOTE — Patient Instructions (Addendum)
  A referral was placed today for pulmonary.  Please let us know if you have not heard back within 2 weeks about the referral.  You have one more refill of your clonazepam at the pharmacy, can refill for anytime after the 22nd.   Stop famotidine, change to pantoprazole and see if this helps better with the heartburn. I am referring you to GI as well.   B12 on lower end of normal suggest over the counter 1000 mcg once daily.    Regards,   Eugenia Pancoast FNP-C

## 2022-03-07 NOTE — Progress Notes (Signed)
Established Patient Office Visit  Subjective:  Patient ID: Sharon [name], female    DOB: January 06, 1966  Age: 56 y.o. MRN: [id number]  CC:  Chief Complaint  Patient presents with  . Transitions Of Care  . Breathing Problem    HPI Sharon [name] is here for a transition of care visit.  Prior provider was: Dr. Einar Pheasant  Pt is without acute concerns.  GYN, Dr. Radene Journey   Acute concerns:   Sob with lying down. When she goes to lie down to sleep at night feels chest heaviness, and has to sleep with elevation of her pillow. If she turns her head to either side it cuts off her airway and she wakes up gasping for breath.   Acute episode while drinking bottle of water with strawberry flavoring in it. Felt tingling in her throat, and pulled over as her throat felt scratchy and started to guzzle another bottle of plain water. She finally got the sensation to fade away . Prior to this about a week prior she was eating with her friends at dinner, ate some salsa and not shortly after airway felt like closing up, and she tried to drink water and coke but couldn't breath so shot herself with her epi pen which reversed the reaction.   Ongoing acid reflux, taking famotidine 20 mg twice a day but doesn't help completely.   Does c/o urinary urgency and dysuria at times.   chronic concerns:  Pain management: sees them for management for left knee, left hip and lower back chronic pain.   Past Medical History:  Diagnosis Date  . Allergy   . Anal fissure   . Anemia    borderline  . Anxiety   . Arthritis   . Blood clot in vein    left leg  . Detached retina    BOTH SCLERA BUCKLE BOTH EYES  . DVT (deep venous thrombosis) (HCC)    left leg  . Endometriosis   . Family history of adverse reaction to anesthesia    DAUGHTER POST OP PONV  . GERD (gastroesophageal reflux disease)   . Glaucoma    RIGHT WORST THAN LEFT  . Heart murmur    "caused by anxiety"   . History of hemorrhoids    . History of kidney stones   . Hypertension   . IBS (irritable bowel syndrome)   . Perforated eardrum, right    SMALL HOLE  . PONV (postoperative nausea and vomiting)   . Pulmonary embolism ([place]) 6 YRS AGO  . Status post arthroscopy of left knee 08/06/2017    Past Surgical History:  Procedure Laterality Date  . ABDOMINAL HYSTERECTOMY     still with both ovaries  . CATARACT EXTRACTION Bilateral 2015  . COLONOSCOPY    . CYSTOSCOPY W/ URETERAL STENT PLACEMENT Right 05/23/2017   Procedure: CYSTOSCOPY WITH RETROGRADE PYELOGRAM/URETERAL RIGHT STENT PLACEMENT;  Surgeon: Bjorn Loser, MD;  Location: [facility];  Service: Urology;  Laterality: Right;  . CYSTOSCOPY W/ URETERAL STENT PLACEMENT Right 06/04/2017   Procedure: CYSTOSCOPY WITHRIGHT RETROGRADE PYELOGRAMRIGHT /URETEREOSCOPY  STENT PLACEMENT;  Surgeon: Lucas Mallow, MD;  Location: [facility];  Service: Urology;  Laterality: Right;  . ENDOMETRIAL ABLATION    . [place]  2011  . INCONTINENCE SURGERY    . INNER EAR SURGERY     X 2  . RECTAL PROLAPSE REPAIR    . RETINAL DETACHMENT SURGERY Bilateral 2000  . TOTAL KNEE ARTHROPLASTY Left 04/29/2019  Procedure: LEFT TOTAL KNEE ARTHROPLASTY;  Surgeon: Mcarthur Rossetti, MD;  Location: [place];  Service: Orthopedics;  Laterality: Left;  . TUBAL LIGATION    . VEIN SURGERY Left 04/2010    Family History  Problem Relation Age of Onset  . Emphysema Mother        smoker  . Allergies Mother   . Asthma Mother   . Heart disease Mother        AMI as cause of death  . COPD Mother   . Asthma Father   . Heart disease Father        AMI as cause of death  . Diabetes Father   . Asthma Brother   . Heart disease Brother 59       heart failure  . Lung cancer Maternal Grandfather        was a smoker  . Cancer Maternal Grandfather        lung  . Heart disease Paternal Grandmother        AMI  . Heart disease Paternal Grandfather        AMI  . Colon  cancer Neg Hx     Social History   Socioeconomic History  . Marital status: Divorced    Spouse name: Not on file  . Number of children: 1  . Years of education: Not on file  . Highest education level: Not on file  Occupational History  . Occupation: Restaurant manager, fast food: [employer]  Tobacco Use  . Smoking status: Never  . Smokeless tobacco: Never  Vaping Use  . Vaping Use: Never used  Substance and Sexual Activity  . Alcohol use: Not Currently    Comment: occ  . Drug use: No    Comment: former maryjuana use- quit in 1995  . Sexual activity: Yes    Partners: Male    Birth control/protection: Surgical  Other Topics Concern  . Not on file  Social History Narrative   Marital status: divorced x 2; dating seriously x 3 years.        Children:  1 daughter (50); no grandchildren      Lives:  With boyfriend.        Employment: works at [employer]: none      Alcohol: rarely; socially      Exercise: none; has treadmill and elliptical and bikes   Coffee in am / 1/2 of coke in afternoon    High school education      01/29/20   From: the area   Living: alone - but boyfriend living with her Wilfred Lacy (2020) but knew each other in high school   Work: on disability - working on appeal      Family: one daughter [name] - one grandchild coming in January       Enjoys: relax and watch tv, use to like fishing/boating/rafting, reading, crochet       Exercise: rehab exercises   Diet: not great - limited intake      Safety   Seat belts: Yes    Guns: Yes  and secure   Right handed   One story home   Drinks caffeine   Social Determinants of Health   Financial Resource Strain: Not on file  Food Insecurity: Not on file  Transportation Needs: Not on file  Physical Activity: Not on file  Stress: Not on file  Social Connections: Not on file  Intimate Partner  Violence: Not on file    Outpatient Medications Prior to Visit  Medication Sig  Dispense Refill  . acetaminophen (TYLENOL) 500 MG tablet Take 1,000 mg by mouth every 8 (eight) hours as needed for moderate pain.     Marland Kitchen acyclovir (ZOVIRAX) 400 MG tablet Take 1 tablet (400 mg total) by mouth at bedtime. (Patient taking differently: Take 400 mg by mouth as needed.) 90 tablet 3  . albuterol (VENTOLIN HFA) 108 (90 Base) MCG/ACT inhaler INHALE 1-2 PUFFS BY MOUTH EVERY 6 HOURS AS NEEDED FOR WHEEZE OR SHORTNESS OF BREATH 8.5 each 2  . amitriptyline (ELAVIL) 10 MG tablet TAKE 1 TABLET BY MOUTH EVERYDAY AT BEDTIME 90 tablet 1  . BESIVANCE 0.6 % SUSP Place 1 drop into the left eye 3 (three) times daily.    . bimatoprost (LUMIGAN) 0.03 % ophthalmic solution Place 1 drop into both eyes at bedtime.    . Cholecalciferol (VITAMIN D) 50 MCG (2000 UT) tablet 1 tablet    . clonazePAM (KLONOPIN) 0.5 MG tablet Take 1 tablet (0.5 mg total) by mouth at bedtime. 30 tablet 5  . estradiol (ESTRACE) 0.1 MG/GM vaginal cream Insert pea-sized amount nightly for 2 weeks then every other night 42.5 g 1  . famotidine (PEPCID) 20 MG tablet TAKE 1 TABLET BY MOUTH TWICE A DAY 180 tablet 1  . gabapentin (NEURONTIN) 600 MG tablet Take 1 tablet (600 mg total) by mouth 3 (three) times daily. 270 tablet 3  . lidocaine (LIDODERM) 5 % PLACE 1 PATCH ONTO THE SKIN DAILY. REMOVE & DISCARD PATCH WITHIN 12 HOURS OR AS DIRECTED BY MD (Patient taking differently: Place 1 patch onto the skin as needed. Remove & Discard patch within 12 hours or as directed by MD) 30 patch 0  . meloxicam (MOBIC) 15 MG tablet Take 1 tablet (15 mg total) by mouth daily. 30 tablet 2  . methocarbamol (ROBAXIN) 500 MG tablet Take 1 tablet (500 mg total) by mouth every 8 (eight) hours as needed for muscle spasms. 90 tablet 11  . Multiple Vitamin (MULTI-VITAMINS) TABS Take 1 tablet by mouth daily.     . nebivolol (BYSTOLIC) 5 MG tablet TAKE 1 TABLET BY MOUTH EVERY DAY 90 tablet 0  . Nitroglycerin (RECTIV) 0.4 % OINT Place 0.4 inches rectally 2 (two) times  daily as needed (For anal fissures.). 30 g 1  . ondansetron (ZOFRAN ODT) 4 MG disintegrating tablet Take 1 tablet (4 mg total) by mouth every 8 (eight) hours as needed for nausea or vomiting. 20 tablet 1  . ondansetron (ZOFRAN) 8 MG tablet Take 1 tablet (8 mg total) by mouth every 8 (eight) hours as needed for nausea or vomiting. 20 tablet 1  . polyethylene glycol powder (GLYCOLAX/MIRALAX) 17 GM/SCOOP powder See admin instructions.    . primidone (MYSOLINE) 50 MG tablet Take 1 tablet in morning and 2 tablets at night 90 tablet 11  . vitamin B-12 (CYANOCOBALAMIN) 1000 MCG tablet Take 1 tablet (1,000 mcg total) by mouth daily. 30 tablet 3  . zinc gluconate 50 MG tablet Take 50 mg by mouth daily.    Marland Kitchen EPINEPHrine 0.3 mg/0.3 mL IJ SOAJ injection See admin instructions.    . Travoprost, BAK Free, (TRAVATAN) 0.004 % SOLN ophthalmic solution INSTILL 1 DROP INTO BOTH EYES AT BEDTIME (Patient not taking: Reported on 03/07/2022) 5 mL 3   No facility-administered medications prior to visit.    Allergies  Allergen Reactions  . Bee Venom Anaphylaxis and Other (See Comments)  .  Penicillins Other (See Comments) and Swelling    Reaction unknown from childhood Has patient had a PCN reaction causing immediate rash, facial/tongue/throat swelling, SOB or lightheadedness with hypotension: unknown Has patient had a PCN reaction causing severe rash involving mucus membranes or skin necrosis: unknown  Has patient had a PCN reaction that required hospitalization: no Has patient had a PCN reaction occurring within the last 10 years: no If all of the above answers are "NO", then may proceed with Cephalosporin use.  Other reaction(s): Other, Unknown Throat swells Throat swells Reaction unknown from childhood Has patient had a PCN reaction causing immediate rash, facial/tongue/throat swelling, SOB or lightheadedness with hypotension: unknown Has patient had a PCN reaction causing severe rash involving mucus membranes  or skin necrosis: unknown  Has patient had a PCN reaction that required hospitalization: no Has patient had a PCN reaction occurring within the last 10 years: no If all of the above answers are "NO", then may proceed with Cephalosporin use.   Marland Kitchen Dextromethorphan Polistirex Er Other (See Comments)    Pt states caused "a lump" in her throat, making it difficult to swallow  . Fluconazole     Primidone interaction Other reaction(s): Primidone interaction  . Hydrocodone Nausea And Vomiting and Nausea Only    Other reaction(s): nausea and vomiting  . Oxycodone-Acetaminophen Other (See Comments)    Made fingers tingle and go numb, started "feeling weird".  Other reaction(s): Other Made fingers tingle and go numb, started "feeling weird".  Other reaction(s): tingle/numb fingers  . Tape Itching and Other (See Comments)    Bandaids - leaves red marks Other reaction(s): itching       Objective:    Physical Exam Vitals reviewed.  Constitutional:      General: She is not in acute distress.    Appearance: Normal appearance. She is not ill-appearing or toxic-appearing.  HENT:     Right Ear: Tympanic membrane normal.     Left Ear: Tympanic membrane normal.     Mouth/Throat:     Mouth: Mucous membranes are moist.     Pharynx: No pharyngeal swelling.     Tonsils: No tonsillar exudate.  Eyes:     Extraocular Movements: Extraocular movements intact.     Conjunctiva/sclera: Conjunctivae normal.     Pupils: Pupils are equal, round, and reactive to light.  Neck:     Thyroid: No thyroid mass.  Cardiovascular:     Rate and Rhythm: Normal rate and regular rhythm.  Pulmonary:     Effort: Pulmonary effort is normal.     Breath sounds: Normal breath sounds.  Abdominal:     General: Abdomen is flat. Bowel sounds are normal.     Palpations: Abdomen is soft.  Musculoskeletal:        General: Normal range of motion.  Lymphadenopathy:     Cervical:     Right cervical: No superficial cervical  adenopathy.    Left cervical: No superficial cervical adenopathy.  Skin:    General: Skin is warm.     Capillary Refill: Capillary refill takes less than 2 seconds.  Neurological:     General: No focal deficit present.     Mental Status: She is alert and oriented to person, place, and time.  Psychiatric:        Mood and Affect: Mood normal.        Behavior: Behavior normal.        Thought Content: Thought content normal.        Judgment:  Judgment normal.      BP 116/70   Pulse 65   Temp 98.6 F (37 C)   Resp 16   Ht '5\' 7"'$  (1.702 m)   Wt 170 lb (77.1 kg)   LMP 10/11/2010   SpO2 97%   BMI 26.63 kg/m  Wt Readings from Last 3 Encounters:  03/07/22 170 lb (77.1 kg)  11/30/21 168 lb (76.2 kg)  10/12/21 171 lb (77.6 kg)     Health Maintenance Due  Topic Date Due  . Zoster Vaccines- Shingrix (1 of 2) Never done  . PAP SMEAR-Modifier  01/03/2018  . COVID-19 Vaccine (4 - Moderna risk series) 10/01/2020  . INFLUENZA VACCINE  01/17/2022    There are no preventive care reminders to display for this patient.  Lab Results  Component Value Date   TSH 1.700 07/04/2018   Lab Results  Component Value Date   WBC 7.4 06/07/2020   HGB 13.0 06/07/2020   HCT 37.4 06/07/2020   MCV 89.7 06/07/2020   PLT 377.0 06/07/2020   Lab Results  Component Value Date   NA 140 04/13/2021   K 4.4 04/13/2021   CO2 31 04/13/2021   GLUCOSE 91 04/13/2021   BUN 15 04/13/2021   CREATININE 0.71 04/13/2021   BILITOT 0.5 04/13/2021   ALKPHOS 91 04/13/2021   AST 23 04/13/2021   ALT 32 04/13/2021   PROT 7.5 04/13/2021   ALBUMIN 4.6 04/13/2021   CALCIUM 9.8 04/13/2021   ANIONGAP 10 11/08/2019   GFR 94.92 04/13/2021   Lab Results  Component Value Date   CHOL 169 04/13/2021   Lab Results  Component Value Date   HDL 54.50 04/13/2021   Lab Results  Component Value Date   LDLCALC 92 04/13/2021   Lab Results  Component Value Date   TRIG 113.0 04/13/2021   Lab Results  Component  Value Date   CHOLHDL 3 04/13/2021   Lab Results  Component Value Date   HGBA1C 5.6 08/02/2021      Assessment & Plan:   Problem List Items Addressed This Visit       Respiratory   Reactive airway disease    Possible exacerbation rx prednisone 20 mg       Bronchiolitis    Finding on CTA 01/17/22 pt with ongoing intermittent  Periods of sob.  Possible suspected exacerbation.  Referral to pulmonary for eval/treat      Relevant Medications   predniSONE (DELTASONE) 20 MG tablet   Other Relevant Orders   Ambulatory referral to Pulmonology     Digestive   Rectocele    Continue with physical therapy as instructed      Gastroesophageal reflux disease    Refill pantoprazole 40 mg once daily  Try to decrease and or avoid spicy foods, fried fatty foods, and also caffeine and chocolate as these can increase heartburn symptoms.        Relevant Medications   pantoprazole (PROTONIX) 40 MG tablet   Other Relevant Orders   Ambulatory referral to Gastroenterology   Irritable bowel syndrome with both constipation and diarrhea    D/w pt fodmap diet        Relevant Medications   pantoprazole (PROTONIX) 40 MG tablet     Other   Tremor    Pt to continue with primodone.      History of endometriosis   Allergic reaction - Primary    Possible food/drink allergic reaction? Trigger unknown.  Referral to allergist for possible allergy testing.  Also  will try without beta blocker to see if any improvement in sob as this may be exacerbating symptoms?       Relevant Medications   EPINEPHrine 0.3 mg/0.3 mL IJ SOAJ injection   Other Relevant Orders   Ambulatory referral to Allergy   Chronic fatigue   Relevant Orders   Ambulatory referral to Pulmonology   Gasping for breath    Referral also for pulmonary possible need for sleep study to r/o sleep apnea with symptoms  Extensive time in the office spent discussing acute and chronic concerns looking through old records to include a  CT scan and prior notes from physical therapy to total 65 minutes total clinic time      Relevant Orders   Ambulatory referral to Pulmonology   RESOLVED: Snoring   Relevant Orders   Ambulatory referral to Pulmonology   Other Visit Diagnoses     Urinary frequency       Relevant Orders   Urine Culture (Completed)   POCT Urinalysis Dipstick (Automated) (Completed)       Meds ordered this encounter  Medications  . EPINEPHrine 0.3 mg/0.3 mL IJ SOAJ injection    Sig: Inject 0.3 mg into the muscle as needed for anaphylaxis.    Dispense:  1 each    Refill:  0    Order Specific Question:   Supervising Provider    Answer:   BEDSOLE, AMY E [[id number]]  . predniSONE (DELTASONE) 20 MG tablet    Sig: Take two tablets once daily for four days and then once daily for three days    Dispense:  11 tablet    Refill:  0    Order Specific Question:   Supervising Provider    Answer:   BEDSOLE, AMY E [[id number]]  . pantoprazole (PROTONIX) 40 MG tablet    Sig: Take 1 tablet (40 mg total) by mouth daily.    Dispense:  30 tablet    Refill:  1    Order Specific Question:   Supervising Provider    Answer:   Diona Browner, AMY E [[id number]]    Follow-up: Return in about 1 month (around 04/06/2022) for as already scheduled for 10/10 with me, f/u heart burn.    Eugenia Pancoast, FNP

## 2022-03-08 ENCOUNTER — Encounter: Payer: Self-pay | Admitting: Family

## 2022-03-09 LAB — URINE CULTURE
MICRO NUMBER:: 13937805
SPECIMEN QUALITY:: ADEQUATE

## 2022-03-10 NOTE — Assessment & Plan Note (Signed)
Referral also for pulmonary possible need for sleep study to r/o sleep apnea with symptoms  Extensive time in the office spent discussing acute and chronic concerns looking through old records to include a CT scan and prior notes from physical therapy to total 65 minutes total clinic time

## 2022-03-10 NOTE — Assessment & Plan Note (Signed)
Refill pantoprazole 40 mg once daily  Try to decrease and or avoid spicy foods, fried fatty foods, and also caffeine and chocolate as these can increase heartburn symptoms.

## 2022-03-10 NOTE — Assessment & Plan Note (Signed)
Finding on CTA 01/17/22 pt with ongoing intermittent  Periods of sob.  Possible suspected exacerbation.  Referral to pulmonary for eval/treat

## 2022-03-10 NOTE — Assessment & Plan Note (Signed)
Continue with physical therapy as instructed

## 2022-03-10 NOTE — Assessment & Plan Note (Signed)
Pt to continue with primodone.

## 2022-03-10 NOTE — Assessment & Plan Note (Signed)
Possible food/drink allergic reaction? Trigger unknown.  Referral to allergist for possible allergy testing.  Also will try without beta blocker to see if any improvement in sob as this may be exacerbating symptoms?

## 2022-03-10 NOTE — Assessment & Plan Note (Signed)
Possible exacerbation rx prednisone 20 mg

## 2022-03-10 NOTE — Assessment & Plan Note (Signed)
D/w pt fodmap diet

## 2022-03-17 ENCOUNTER — Encounter: Payer: Self-pay | Admitting: Primary Care

## 2022-03-17 ENCOUNTER — Ambulatory Visit (INDEPENDENT_AMBULATORY_CARE_PROVIDER_SITE_OTHER): Payer: Medicare Other | Admitting: Primary Care

## 2022-03-17 VITALS — BP 116/76 | HR 66 | Temp 98.9°F | Ht 67.5 in | Wt 168.2 lb

## 2022-03-17 DIAGNOSIS — J040 Acute laryngitis: Secondary | ICD-10-CM | POA: Diagnosis not present

## 2022-03-17 DIAGNOSIS — R0789 Other chest pain: Secondary | ICD-10-CM | POA: Diagnosis not present

## 2022-03-17 DIAGNOSIS — J4520 Mild intermittent asthma, uncomplicated: Secondary | ICD-10-CM | POA: Diagnosis not present

## 2022-03-17 DIAGNOSIS — K219 Gastro-esophageal reflux disease without esophagitis: Secondary | ICD-10-CM

## 2022-03-17 DIAGNOSIS — R0683 Snoring: Secondary | ICD-10-CM

## 2022-03-17 DIAGNOSIS — G2581 Restless legs syndrome: Secondary | ICD-10-CM

## 2022-03-17 MED ORDER — FAMOTIDINE 40 MG PO TABS: 40.0000 mg | ORAL_TABLET | Freq: Every day | ORAL | 3 refills | Status: DC

## 2022-03-17 NOTE — Progress Notes (Unsigned)
$'@Patient'O$  ID: Sharon [name], female    DOB: 02/19/1966, 56 y.o.   MRN: [id number]  No chief complaint on file.   Referring provider: Eugenia Pancoast, FNP  HPI: 56 year old female, never smoked.  Past medical history significant for hypertension, reactive airway disease, GERD, endometriosis, iron deficiency, insomnia.  PSG 05/12/2015 showed no evidence of obstructive sleep apnea, AHI 0 with SPO2 low 92%. Weight 149lbs.   03/17/2022 Patient presents today for sleep consult.  Snoring symptoms. Weight up 20 lbs since last sleep study She has symptoms of shortness of breath, np cough, indigestion  Chest tightness at night and sob during day Needs PFTs and HST She has reflux and throat irritation, referring to ENT History RLS, recent iron panel was normal in March 2023  She had left knee replacement a couple months back    Sleep questionnaire Symptoms-  cant breath/waking up at night, inflammation in throat  Prior sleep study- 2016 Bedtime- 10pm-12am Time to fall asleep- 30-38mn  Nocturnal awakenings- 3-5 times Out of bed/start of day- 6:30am-10am Weight changes- 30 lbs Do you operate heavy machinery- No Do you currently wear CPAP- No Do you current wear oxygen- No Epworth- 10  Allergies  Allergen Reactions   Bee Venom Anaphylaxis and Other (See Comments)   Penicillins Other (See Comments) and Swelling    Reaction unknown from childhood Has patient had a PCN reaction causing immediate rash, facial/tongue/throat swelling, SOB or lightheadedness with hypotension: unknown Has patient had a PCN reaction causing severe rash involving mucus membranes or skin necrosis: unknown  Has patient had a PCN reaction that required hospitalization: no Has patient had a PCN reaction occurring within the last 10 years: no If all of the above answers are "NO", then may proceed with Cephalosporin use.  Other reaction(s): Other, Unknown Throat swells Throat swells Reaction unknown from  childhood Has patient had a PCN reaction causing immediate rash, facial/tongue/throat swelling, SOB or lightheadedness with hypotension: unknown Has patient had a PCN reaction causing severe rash involving mucus membranes or skin necrosis: unknown  Has patient had a PCN reaction that required hospitalization: no Has patient had a PCN reaction occurring within the last 10 years: no If all of the above answers are "NO", then may proceed with Cephalosporin use.    Dextromethorphan Polistirex Er Other (See Comments)    Pt states caused "a lump" in her throat, making it difficult to swallow   Fluconazole     Primidone interaction Other reaction(s): Primidone interaction   Hydrocodone Nausea And Vomiting and Nausea Only    Other reaction(s): nausea and vomiting   Oxycodone-Acetaminophen Other (See Comments)    Made fingers tingle and go numb, started "feeling weird".  Other reaction(s): Other Made fingers tingle and go numb, started "feeling weird".  Other reaction(s): tingle/numb fingers   Tape Itching and Other (See Comments)    Bandaids - leaves red marks Other reaction(s): itching    Immunization History  Administered Date(s) Administered   Influenza Whole 03/19/2010   Influenza,inj,Quad PF,6+ Mos 03/25/2015, 04/13/2021   Influenza-Unspecified 03/19/2018   Moderna Sars-Covid-2 Vaccination 06/21/2019, 07/19/2019, 08/06/2020   Tdap 11/08/2016    Past Medical History:  Diagnosis Date   Allergy    Anal fissure    Anemia    borderline   Anxiety    Arthritis    Blood clot in vein    left leg   Detached retina    BOTH SCLERA BUCKLE BOTH EYES   DVT (deep venous thrombosis) ([facility]  left leg   Endometriosis    Family history of adverse reaction to anesthesia    DAUGHTER POST OP PONV   GERD (gastroesophageal reflux disease)    Glaucoma    RIGHT WORST THAN LEFT   Heart murmur    "caused by anxiety"    History of hemorrhoids    History of kidney stones    Hypertension     IBS (irritable bowel syndrome)    Perforated eardrum, right    SMALL HOLE   PONV (postoperative nausea and vomiting)    Pulmonary embolism (HCC) 6 YRS AGO   Status post arthroscopy of left knee 08/06/2017    Tobacco History: Social History   Tobacco Use  Smoking Status Never  Smokeless Tobacco Never   Counseling given: Not Answered   Outpatient Medications Prior to Visit  Medication Sig Dispense Refill   acetaminophen (TYLENOL) 500 MG tablet Take 1,000 mg by mouth every 8 (eight) hours as needed for moderate pain.      acyclovir (ZOVIRAX) 400 MG tablet Take 1 tablet (400 mg total) by mouth at bedtime. (Patient taking differently: Take 400 mg by mouth as needed.) 90 tablet 3   albuterol (VENTOLIN HFA) 108 (90 Base) MCG/ACT inhaler INHALE 1-2 PUFFS BY MOUTH EVERY 6 HOURS AS NEEDED FOR WHEEZE OR SHORTNESS OF BREATH 8.5 each 2   amitriptyline (ELAVIL) 10 MG tablet TAKE 1 TABLET BY MOUTH EVERYDAY AT BEDTIME 90 tablet 1   BESIVANCE 0.6 % SUSP Place 1 drop into the left eye 3 (three) times daily.     bimatoprost (LUMIGAN) 0.03 % ophthalmic solution Place 1 drop into both eyes at bedtime.     Cholecalciferol (VITAMIN D) 50 MCG (2000 UT) tablet 1 tablet     clonazePAM (KLONOPIN) 0.5 MG tablet Take 1 tablet (0.5 mg total) by mouth at bedtime. 30 tablet 5   EPINEPHrine 0.3 mg/0.3 mL IJ SOAJ injection Inject 0.3 mg into the muscle as needed for anaphylaxis. 1 each 0   estradiol (ESTRACE) 0.1 MG/GM vaginal cream Insert pea-sized amount nightly for 2 weeks then every other night 42.5 g 1   famotidine (PEPCID) 20 MG tablet TAKE 1 TABLET BY MOUTH TWICE A DAY 180 tablet 1   gabapentin (NEURONTIN) 600 MG tablet Take 1 tablet (600 mg total) by mouth 3 (three) times daily. 270 tablet 3   lidocaine (LIDODERM) 5 % PLACE 1 PATCH ONTO THE SKIN DAILY. REMOVE & DISCARD PATCH WITHIN 12 HOURS OR AS DIRECTED BY MD (Patient taking differently: Place 1 patch onto the skin as needed. Remove & Discard patch within 12  hours or as directed by MD) 30 patch 0   meloxicam (MOBIC) 15 MG tablet Take 1 tablet (15 mg total) by mouth daily. 30 tablet 2   methocarbamol (ROBAXIN) 500 MG tablet Take 1 tablet (500 mg total) by mouth every 8 (eight) hours as needed for muscle spasms. 90 tablet 11   Multiple Vitamin (MULTI-VITAMINS) TABS Take 1 tablet by mouth daily.      nebivolol (BYSTOLIC) 5 MG tablet TAKE 1 TABLET BY MOUTH EVERY DAY 90 tablet 0   Nitroglycerin (RECTIV) 0.4 % OINT Place 0.4 inches rectally 2 (two) times daily as needed (For anal fissures.). 30 g 1   ondansetron (ZOFRAN ODT) 4 MG disintegrating tablet Take 1 tablet (4 mg total) by mouth every 8 (eight) hours as needed for nausea or vomiting. 20 tablet 1   ondansetron (ZOFRAN) 8 MG tablet Take 1 tablet (8 mg total) by  mouth every 8 (eight) hours as needed for nausea or vomiting. 20 tablet 1   pantoprazole (PROTONIX) 40 MG tablet Take 1 tablet (40 mg total) by mouth daily. 30 tablet 1   polyethylene glycol powder (GLYCOLAX/MIRALAX) 17 GM/SCOOP powder See admin instructions.     predniSONE (DELTASONE) 20 MG tablet Take two tablets once daily for four days and then once daily for three days 11 tablet 0   primidone (MYSOLINE) 50 MG tablet Take 1 tablet in morning and 2 tablets at night 90 tablet 11   vitamin B-12 (CYANOCOBALAMIN) 1000 MCG tablet Take 1 tablet (1,000 mcg total) by mouth daily. 30 tablet 3   zinc gluconate 50 MG tablet Take 50 mg by mouth daily.     No facility-administered medications prior to visit.   Review of Systems  Review of Systems  Constitutional:  Positive for fatigue.  Respiratory:  Positive for cough and shortness of breath.   Psychiatric/Behavioral:  Positive for sleep disturbance.      Physical Exam  LMP 10/11/2010  Physical Exam Constitutional:      General: She is not in acute distress.    Appearance: Normal appearance. She is not ill-appearing.  Cardiovascular:     Rate and Rhythm: Normal rate and regular rhythm.   Pulmonary:     Effort: Pulmonary effort is normal.     Breath sounds: Normal breath sounds.     Comments: CTA Skin:    General: Skin is warm.  Neurological:     General: No focal deficit present.     Mental Status: She is alert and oriented to person, place, and time. Mental status is at baseline.  Psychiatric:        Mood and Affect: Mood normal.        Behavior: Behavior normal.        Thought Content: Thought content normal.        Judgment: Judgment normal.      Lab Results:  CBC    Component Value Date/Time   WBC 7.4 06/07/2020 1625   RBC 4.17 06/07/2020 1625   HGB 13.0 06/07/2020 1625   HGB 12.0 01/20/2019 1649   HCT 37.4 06/07/2020 1625   HCT 35.5 01/20/2019 1649   PLT 377.0 06/07/2020 1625   PLT 293 01/20/2019 1649   MCV 89.7 06/07/2020 1625   MCV 84.7 06/09/2019 1451   MCV 87 01/20/2019 1649   MCH 30.9 11/08/2019 0737   MCHC 34.8 06/07/2020 1625   RDW 12.8 06/07/2020 1625   RDW 12.6 01/20/2019 1649   LYMPHSABS 1.6 06/07/2020 1625   LYMPHSABS 1.7 07/19/2018 1321   MONOABS 0.5 06/07/2020 1625   EOSABS 0.1 06/07/2020 1625   EOSABS 0.2 07/19/2018 1321   BASOSABS 0.1 06/07/2020 1625   BASOSABS 0.0 07/19/2018 1321    BMET    Component Value Date/Time   NA 140 04/13/2021 1204   NA 141 01/20/2019 1649   K 4.4 04/13/2021 1204   CL 104 04/13/2021 1204   CO2 31 04/13/2021 1204   GLUCOSE 91 04/13/2021 1204   BUN 15 04/13/2021 1204   BUN 24 01/20/2019 1649   CREATININE 0.71 04/13/2021 1204   CREATININE 0.73 10/21/2015 1919   CALCIUM 9.8 04/13/2021 1204   GFRNONAA >60 11/08/2019 0737   GFRAA >60 11/08/2019 0737    BNP No results found for: "BNP"  ProBNP No results found for: "PROBNP"  Imaging: No results found.   Assessment & Plan:   No problem-specific Assessment & Plan notes  found for this encounter.     Martyn Ehrich, NP 03/17/2022

## 2022-03-17 NOTE — Patient Instructions (Addendum)
  Recommendations: Increase famotidine to 40 mg once daily in the morning Albuterol 2 puffs every 4-6 hours as needed for breakthrough shortness of breath Elevate head of bed if able with either adjustable bed frame or wedge pillow up with reflux and possible sleep apnea  Orders: HST Pulmonary function testing  Referral: ENT re: dysphagia, reflux   Follow-up: 30 min new patient consult (sleep/ pulmonary) with either Dr. Ander Slade or Dr. Elsworth Soho in October or November

## 2022-03-18 DIAGNOSIS — G2581 Restless legs syndrome: Secondary | ICD-10-CM | POA: Insufficient documentation

## 2022-03-18 DIAGNOSIS — J040 Acute laryngitis: Secondary | ICD-10-CM | POA: Insufficient documentation

## 2022-03-18 NOTE — Assessment & Plan Note (Addendum)
-   She reports symptoms of reflux and throat irritation/inflammation  - Advised she increase famotidine '40mg'$  once daily (she could not do BID dosing) - Referring to ENT for upper airway assessment

## 2022-03-18 NOTE — Assessment & Plan Note (Addendum)
-   Patient has symptoms of snoring.  Epworth score 10. Sleep study in 2016 was negative. Weight is up 20-30lbs since last study. Suspicion she could have sleep apnea d/t symptoms and weight gain. Needs repeat HST. We discussed risks of untreated sleep apnea and treatment options.

## 2022-03-18 NOTE — Assessment & Plan Note (Addendum)
-   Patient has symptoms of shortness of breath, chest tightness, NP - Needs formal pulmonary function testing  - Use Albuterol hfa 2 puffs every 4-6 hours as needed for breakthrough shortness of breath - Needs follow-up visit with pulmonary/sleep MD

## 2022-03-18 NOTE — Assessment & Plan Note (Signed)
-   Recent iron panel was normal in March 2023

## 2022-03-20 NOTE — Progress Notes (Signed)
Reviewed and agree with assessment/plan.   Chesley Mires, MD [facility] Pulmonary/Critical Care 03/20/2022, 8:49 AM Pager:  [phone]

## 2022-03-25 ENCOUNTER — Other Ambulatory Visit: Payer: Self-pay | Admitting: Family Medicine

## 2022-03-25 DIAGNOSIS — K219 Gastro-esophageal reflux disease without esophagitis: Secondary | ICD-10-CM

## 2022-03-28 ENCOUNTER — Encounter
Payer: No Typology Code available for payment source | Attending: Physical Medicine and Rehabilitation | Admitting: Physical Medicine and Rehabilitation

## 2022-03-28 ENCOUNTER — Encounter: Payer: Self-pay | Admitting: Family

## 2022-03-28 ENCOUNTER — Encounter: Payer: Self-pay | Admitting: Physical Medicine and Rehabilitation

## 2022-03-28 ENCOUNTER — Ambulatory Visit (INDEPENDENT_AMBULATORY_CARE_PROVIDER_SITE_OTHER): Payer: No Typology Code available for payment source | Admitting: Family

## 2022-03-28 VITALS — BP 133/85 | HR 74 | Temp 98.6°F | Resp 16 | Ht 67.5 in | Wt 172.0 lb

## 2022-03-28 VITALS — BP 133/85 | HR 77 | Ht 67.5 in | Wt 172.0 lb

## 2022-03-28 DIAGNOSIS — D519 Vitamin B12 deficiency anemia, unspecified: Secondary | ICD-10-CM | POA: Diagnosis not present

## 2022-03-28 DIAGNOSIS — R635 Abnormal weight gain: Secondary | ICD-10-CM | POA: Insufficient documentation

## 2022-03-28 DIAGNOSIS — M25561 Pain in right knee: Secondary | ICD-10-CM | POA: Diagnosis present

## 2022-03-28 DIAGNOSIS — R5382 Chronic fatigue, unspecified: Secondary | ICD-10-CM | POA: Diagnosis not present

## 2022-03-28 DIAGNOSIS — D509 Iron deficiency anemia, unspecified: Secondary | ICD-10-CM | POA: Diagnosis not present

## 2022-03-28 DIAGNOSIS — M25562 Pain in left knee: Secondary | ICD-10-CM | POA: Insufficient documentation

## 2022-03-28 DIAGNOSIS — R413 Other amnesia: Secondary | ICD-10-CM | POA: Diagnosis present

## 2022-03-28 DIAGNOSIS — R0683 Snoring: Secondary | ICD-10-CM

## 2022-03-28 DIAGNOSIS — R7303 Prediabetes: Secondary | ICD-10-CM | POA: Insufficient documentation

## 2022-03-28 DIAGNOSIS — G8929 Other chronic pain: Secondary | ICD-10-CM | POA: Diagnosis present

## 2022-03-28 DIAGNOSIS — M5440 Lumbago with sciatica, unspecified side: Secondary | ICD-10-CM | POA: Diagnosis present

## 2022-03-28 DIAGNOSIS — G2581 Restless legs syndrome: Secondary | ICD-10-CM

## 2022-03-28 DIAGNOSIS — L409 Psoriasis, unspecified: Secondary | ICD-10-CM

## 2022-03-28 DIAGNOSIS — J4520 Mild intermittent asthma, uncomplicated: Secondary | ICD-10-CM

## 2022-03-28 DIAGNOSIS — Z79899 Other long term (current) drug therapy: Secondary | ICD-10-CM

## 2022-03-28 DIAGNOSIS — R0602 Shortness of breath: Secondary | ICD-10-CM | POA: Diagnosis present

## 2022-03-28 DIAGNOSIS — K589 Irritable bowel syndrome without diarrhea: Secondary | ICD-10-CM | POA: Insufficient documentation

## 2022-03-28 MED ORDER — CLOBETASOL PROPIONATE 0.05 % EX SOLN: 1.0000 | Freq: Two times a day (BID) | CUTANEOUS | 0 refills | Status: DC

## 2022-03-28 NOTE — Progress Notes (Signed)
Established Patient Office Visit  Subjective:  Patient ID: Sharon [name], female    DOB: 21-May-1966  Age: 56 y.o. MRN: [id number]  CC:  Chief Complaint  Patient presents with   Transitions Of Care    HPI Sharon [name] is here for a transition of care visit as well as here with numerous acute concerns.   Prior provider was: Dr Waunita Schooner    Loud snoring: recently seen by pulmonary they are going to set her up with repeat sleep study. Pending appt.   Reactive airway disease: ordering pulmonary function testing, has test on Friday.   Reflux: referred to ENT for upper airway assessment, has appt pending consult.  Advised to increase famotidine to 40 mg once daily in the morning.   GAD: klonipin 0.5 mg once daily prn, requesting refill. Pdmp reviewed however last filled 9/22, too early.   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   Pt feels as though she has a small, and smells like dirty socks however she states that no one else seems to say she smells.   C/o about thirty pound weight gain in the last three years, hasn't changed eating much however is sitting more often.   Past Medical History:  Diagnosis Date   Allergy    Anal fissure    Anemia    borderline   Anxiety    Arthritis    Blood clot in vein    left leg   Detached retina    BOTH SCLERA BUCKLE BOTH EYES   DVT (deep venous thrombosis) (HCC)    left leg   Endometriosis    Family history of adverse reaction to anesthesia    DAUGHTER POST OP PONV   GERD (gastroesophageal reflux disease)    Glaucoma    RIGHT WORST THAN LEFT   Heart murmur    "caused by anxiety"    History of hemorrhoids    History of kidney stones    Hypertension    IBS (irritable bowel syndrome)    Perforated eardrum, right    SMALL HOLE   PONV (postoperative nausea and vomiting)    Pulmonary embolism (HCC) 6 YRS AGO   Status post arthroscopy of left knee 08/06/2017    Past Surgical  History:  Procedure Laterality Date   ABDOMINAL HYSTERECTOMY     still with both ovaries   CATARACT EXTRACTION Bilateral 2015   COLONOSCOPY     CYSTOSCOPY W/ URETERAL STENT PLACEMENT Right 05/23/2017   Procedure: CYSTOSCOPY WITH RETROGRADE PYELOGRAM/URETERAL RIGHT STENT PLACEMENT;  Surgeon: Bjorn Loser, MD;  Location: [facility];  Service: Urology;  Laterality: Right;   CYSTOSCOPY W/ URETERAL STENT PLACEMENT Right 06/04/2017   Procedure: CYSTOSCOPY WITHRIGHT RETROGRADE PYELOGRAMRIGHT /URETEREOSCOPY  STENT PLACEMENT;  Surgeon: Lucas Mallow, MD;  Location: [facility];  Service: Urology;  Laterality: Right;   ENDOMETRIAL ABLATION     HEMORRHOID SURGERY  2011   INCONTINENCE SURGERY     INNER EAR SURGERY     X 2   RECTAL PROLAPSE REPAIR     RETINAL DETACHMENT SURGERY Bilateral 2000   TOTAL KNEE ARTHROPLASTY Left 04/29/2019   Procedure: LEFT TOTAL KNEE ARTHROPLASTY;  Surgeon: Mcarthur Rossetti, MD;  Location: [place];  Service: Orthopedics;  Laterality: Left;   TUBAL LIGATION     VEIN SURGERY Left 04/2010    Family History  Problem Relation Age of Onset   Emphysema Mother  smoker   Allergies Mother    Asthma Mother    Heart disease Mother        AMI as cause of death   COPD Mother    Asthma Father    Heart disease Father        AMI as cause of death   Diabetes Father    Asthma Brother    Heart disease Brother 68       heart failure   Lung cancer Maternal Grandfather        was a smoker   Cancer Maternal Grandfather        lung   Heart disease Paternal Grandmother        AMI   Heart disease Paternal Grandfather        AMI   Colon cancer Neg Hx     Social History   Socioeconomic History   Marital status: Divorced    Spouse name: Not on file   Number of children: 1   Years of education: Not on file   Highest education level: Not on file  Occupational History   Occupation: Restaurant manager, fast food: [name]   Tobacco Use   Smoking status: Never   Smokeless tobacco: Never  Vaping Use   Vaping Use: Never used  Substance and Sexual Activity   Alcohol use: Not Currently    Comment: occ   Drug use: No    Comment: former maryjuana use- quit in 1995   Sexual activity: Yes    Partners: Male    Birth control/protection: Surgical  Other Topics Concern   Not on file  Social History Narrative   Marital status: divorced x 2; dating seriously x 3 years.        Children:  1 daughter (68); no grandchildren      Lives:  With boyfriend.        Employment: works at [employer]: none      Alcohol: rarely; socially      Exercise: none; has treadmill and elliptical and bikes   Coffee in am / 1/2 of coke in afternoon    High school education      01/29/20   From: the area   Living: alone - but boyfriend living with her [name] (2020) but knew each other in high school   Work: on disability - working on appeal      Family: one daughter Sharon [name] - one grandchild coming in January       Enjoys: relax and watch tv, use to like fishing/boating/rafting, reading, crochet       Exercise: rehab exercises   Diet: not great - limited intake      Safety   Seat belts: Yes    Guns: Yes  and secure   Right handed   One story home   Drinks caffeine   Social Determinants of Health   Financial Resource Strain: Not on file  Food Insecurity: Not on file  Transportation Needs: Not on file  Physical Activity: Not on file  Stress: Not on file  Social Connections: Not on file  Intimate Partner Violence: Not on file    Outpatient Medications Prior to Visit  Medication Sig Dispense Refill   acetaminophen (TYLENOL) 500 MG tablet Take 1,000 mg by mouth every 8 (eight) hours as needed for moderate pain.      acyclovir (ZOVIRAX) 400 MG tablet Take 1 tablet by mouth daily.  albuterol (VENTOLIN HFA) 108 (90 Base) MCG/ACT inhaler INHALE 1-2 PUFFS BY MOUTH EVERY 6 HOURS AS NEEDED FOR WHEEZE OR SHORTNESS  OF BREATH 8.5 each 2   amitriptyline (ELAVIL) 10 MG tablet 1 tablet at bedtime Orally Once a day for 30 day(s)     bimatoprost (LUMIGAN) 0.03 % ophthalmic solution Place 1 drop into both eyes at bedtime.     Cholecalciferol (VITAMIN D) 50 MCG (2000 UT) tablet 1 tablet     clonazePAM (KLONOPIN) 0.5 MG tablet Take 1 tablet by mouth at bedtime.     diclofenac Sodium (VOLTAREN) 1 % GEL      EPINEPHrine 0.3 mg/0.3 mL IJ SOAJ injection Inject 0.3 mg into the muscle as needed for anaphylaxis. 1 each 0   gabapentin (NEURONTIN) 600 MG tablet Take 1 tablet by mouth 3 (three) times daily.     lidocaine (LIDODERM) 5 % Apply 1 patch topically daily.     LUMIGAN 0.01 % SOLN 1 drop at bedtime.     meloxicam (MOBIC) 15 MG tablet Take 1 tablet by mouth daily.     methocarbamol (ROBAXIN) 500 MG tablet Take 1 tablet by mouth 4 (four) times daily.     Multiple Vitamin (MULTI VITAMIN) TABS 1 tablet Orally Once a day for 30 day(s)     nebivolol (BYSTOLIC) 5 MG tablet Take 1 tablet by mouth daily.     Nitroglycerin (RECTIV) 0.4 % OINT Place 0.4 inches rectally 2 (two) times daily as needed (For anal fissures.). 30 g 1   ondansetron (ZOFRAN-ODT) 4 MG disintegrating tablet 1 tablet on the tongue and allow to dissolve Orally Once a day for 30 day(s)     pantoprazole (PROTONIX) 40 MG tablet Take 1 tablet (40 mg total) by mouth daily. 30 tablet 1   polyethylene glycol powder (GLYCOLAX/MIRALAX) 17 GM/SCOOP powder See admin instructions.     primidone (MYSOLINE) 50 MG tablet 1 tablet Orally Once a day for 30 day(s)     Travoprost, BAK Free, (TRAVATAN) 0.004 % SOLN ophthalmic solution 1 drop into affected eye in the evening Ophthalmic Once a day     vitamin B-12 (CYANOCOBALAMIN) 1000 MCG tablet Take 1 tablet (1,000 mcg total) by mouth daily. 30 tablet 3   Zinc 50 MG TABS 1 tablet Orally Once a day for 30 day(s)     acyclovir (ZOVIRAX) 400 MG tablet Take 1 tablet (400 mg total) by mouth at bedtime. (Patient taking  differently: Take 400 mg by mouth as needed.) 90 tablet 3   Albuterol Sulfate (PROAIR RESPICLICK) 656 (90 Base) MCG/ACT AEPB 1 puff as needed Inhalation every 4 hrs     Cholecalciferol 50 MCG (2000 UT) TABS 1 tablet Orally Once a day for 30 day(s)     conjugated estrogens (PREMARIN) vaginal cream      Cyanocobalamin (VITAMIN B12) 1000 MCG TBCR 1 tablet Orally Once a day for 30 day(s)     DULoxetine HCl 20 MG CSDR Take 2 capsules by mouth daily.     estradiol (ESTRACE) 0.1 MG/GM vaginal cream Insert pea-sized amount nightly for 2 weeks then every other night 42.5 g 1   famotidine (PEPCID) 20 MG tablet Take 1 tablet by mouth 2 (two) times daily.     famotidine (PEPCID) 40 MG tablet Take 1 tablet (40 mg total) by mouth daily. 30 tablet 3   methocarbamol (ROBAXIN) 500 MG tablet Take 1 tablet (500 mg total) by mouth every 8 (eight) hours as needed for muscle spasms. 90 tablet  11   ondansetron (ZOFRAN) 8 MG tablet Take 1 tablet (8 mg total) by mouth every 8 (eight) hours as needed for nausea or vomiting. 20 tablet 1   predniSONE (DELTASONE) 20 MG tablet Take two tablets once daily for four days and then once daily for three days (Patient not taking: Reported on 03/28/2022) 11 tablet 0   No facility-administered medications prior to visit.    Allergies  Allergen Reactions   Bee Venom Anaphylaxis and Other (See Comments)   Penicillins Other (See Comments) and Swelling    Reaction unknown from childhood Has patient had a PCN reaction causing immediate rash, facial/tongue/throat swelling, SOB or lightheadedness with hypotension: unknown Has patient had a PCN reaction causing severe rash involving mucus membranes or skin necrosis: unknown  Has patient had a PCN reaction that required hospitalization: no Has patient had a PCN reaction occurring within the last 10 years: no If all of the above answers are "NO", then may proceed with Cephalosporin use.  Other reaction(s): Other, Unknown Throat  swells Throat swells Reaction unknown from childhood Has patient had a PCN reaction causing immediate rash, facial/tongue/throat swelling, SOB or lightheadedness with hypotension: unknown Has patient had a PCN reaction causing severe rash involving mucus membranes or skin necrosis: unknown  Has patient had a PCN reaction that required hospitalization: no Has patient had a PCN reaction occurring within the last 10 years: no If all of the above answers are "NO", then may proceed with Cephalosporin use.  Other reaction(s): Not available   Dextromethorphan     Other reaction(s): Lump in throat Other reaction(s): Lump in throat   Penicillin G     Other reaction(s): Unknown   Dextromethorphan Polistirex Er Other (See Comments)    Pt states caused "a lump" in her throat, making it difficult to swallow   Fluconazole     Primidone interaction Other reaction(s): Primidone interaction Other reaction(s): Primidone interaction   Hydrocodone Nausea And Vomiting and Nausea Only    Other reaction(s): nausea and vomiting Other reaction(s): nausea and vomiting   Oxycodone-Acetaminophen Other (See Comments)    Made fingers tingle and go numb, started "feeling weird".  Other reaction(s): Other Made fingers tingle and go numb, started "feeling weird".  Other reaction(s): tingle/numb fingers Other reaction(s): tingle/numb fingers   Tape Itching and Other (See Comments)    Bandaids - leaves red marks Other reaction(s): itching Other reaction(s): itching       Objective:    Physical Exam Constitutional:      Appearance: Normal appearance.  HENT:     Right Ear: Hearing, ear canal and external ear normal. Tympanic membrane is perforated.     Left Ear: Hearing, tympanic membrane, ear canal and external ear normal.  Cardiovascular:     Rate and Rhythm: Normal rate and regular rhythm.     Pulses: Normal pulses.     Heart sounds: No murmur heard. Pulmonary:     Effort: Pulmonary effort is  normal.     Breath sounds: Normal breath sounds. No wheezing.  Abdominal:     General: Abdomen is flat.  Skin:    Comments: Mild erythema spots of scalp   Neurological:     General: No focal deficit present.     Mental Status: She is alert and oriented to person, place, and time. Mental status is at baseline.  Psychiatric:        Mood and Affect: Mood is anxious.        Speech: Speech normal.  Behavior: Behavior is hyperactive. Behavior is cooperative.        Thought Content: Thought content normal.        Cognition and Memory: Cognition and memory normal.        Judgment: Judgment normal.     BP 133/85   Pulse 74   Temp 98.6 F (37 C)   Resp 16   Ht 5' 7.5" (1.715 m)   Wt 172 lb (78 kg)   LMP 10/11/2010   SpO2 98%   BMI 26.54 kg/m  Wt Readings from Last 3 Encounters:  03/28/22 172 lb (78 kg)  03/28/22 172 lb (78 kg)  03/17/22 168 lb 3.2 oz (76.3 kg)     Health Maintenance Due  Topic Date Due   Zoster Vaccines- Shingrix (1 of 2) Never done   PAP SMEAR-Modifier  01/03/2018   COVID-19 Vaccine (4 - Moderna risk series) 10/01/2020   INFLUENZA VACCINE  01/17/2022    There are no preventive care reminders to display for this patient.  Lab Results  Component Value Date   TSH 1.700 07/04/2018   Lab Results  Component Value Date   WBC 7.4 06/07/2020   HGB 13.0 06/07/2020   HCT 37.4 06/07/2020   MCV 89.7 06/07/2020   PLT 377.0 06/07/2020   Lab Results  Component Value Date   NA 140 04/13/2021   K 4.4 04/13/2021   CO2 31 04/13/2021   GLUCOSE 91 04/13/2021   BUN 15 04/13/2021   CREATININE 0.71 04/13/2021   BILITOT 0.5 04/13/2021   ALKPHOS 91 04/13/2021   AST 23 04/13/2021   ALT 32 04/13/2021   PROT 7.5 04/13/2021   ALBUMIN 4.6 04/13/2021   CALCIUM 9.8 04/13/2021   ANIONGAP 10 11/08/2019   GFR 94.92 04/13/2021   Lab Results  Component Value Date   CHOL 169 04/13/2021   Lab Results  Component Value Date   HDL 54.50 04/13/2021   Lab Results   Component Value Date   LDLCALC 92 04/13/2021   Lab Results  Component Value Date   TRIG 113.0 04/13/2021   Lab Results  Component Value Date   CHOLHDL 3 04/13/2021   Lab Results  Component Value Date   HGBA1C 5.6 08/02/2021      Assessment & Plan:   Problem List Items Addressed This Visit       Respiratory   Reactive airway disease    F/u with pulm for scheduled pft pending results        Musculoskeletal and Integument   Psoriasis of scalp    rx clobetasol solution for scalp rx sent in       Relevant Medications   clobetasol (TEMOVATE) 0.05 % external solution     Other   Chronic prescription benzodiazepine use    D/w pt risk factors and possible other options for anxiety control Pt declines at this time Can maybe consider lexapro and or remeron in the future? Might help with sleep as well.   pdmp reviewed, can refill around 10/20. Advised pt to send me refill reminder close to that date.      Chronic fatigue   Relevant Orders   Comprehensive metabolic panel   Loud snoring    Continue f/u with pulmonary in regards to scheduling sleep study toe val for osa      Restless leg    Ordering b12 Can consider requip and or magnesium supplement in the future      Iron deficiency anemia   Relevant Orders  B12 and Folate Panel   CBC   Anemia due to vitamin B12 deficiency    ordering b12 and cbc pending results      Relevant Orders   B12 and Folate Panel   CBC   Abnormal weight gain - Primary    Ordering tsh to r/o thyroid disease  Pending results Try less weight bearing exercises to try to lose weight such as swimming  Numerous concerns in office totaling 46 minutes in the clinic going over acute and chronic concerns      Relevant Orders   TSH   Comprehensive metabolic panel    Meds ordered this encounter  Medications   clobetasol (TEMOVATE) 0.05 % external solution    Sig: Apply 1 Application topically 2 (two) times daily.    Dispense:  50  mL    Refill:  0    Order Specific Question:   Supervising Provider    Answer:   BEDSOLE, AMY E [[id number]]    Follow-up: Return in about 2 weeks (around 04/11/2022) for f/u blood pressure .    Eugenia Pancoast, FNP

## 2022-03-28 NOTE — Assessment & Plan Note (Signed)
Continue f/u with pulmonary in regards to scheduling sleep study toe val for osa

## 2022-03-28 NOTE — Assessment & Plan Note (Signed)
F/u with pulm for scheduled pft pending results

## 2022-03-28 NOTE — Progress Notes (Addendum)
Subjective:    Patient ID: Sharon [name], female    DOB: 14-May-1966, 56 y.o.   MRN: [id number]  HPI   Sharon [name] is a 56 year old woman who presents for f/u bilateral knee pain following left knee replacement in 04/2019, as well as shortness of breath.   1) Bilateral knee pain L>R: -right knee pain has been getting worse -Her ROM is still restricted in her left knee.  -ice helps -radiates between hip and left knee Feels like a charley horse -She asks whether the scar tissue will ever heal.  -Cannot sleep without a pillow underneath her legs.  -She has follow-up with Dr. Mayer Camel and Dr. Ninfa Linden -Currently is not planning to pursue surgery.  -She has been off hydrocodone.  -She would be interested in increasing the Gabapentin dose.  -She would like to discuss an option for inflammation. She has tried aspirations but the fluid comes back right away.  -She got a  Steroid injection and experienced worsening range of motion in her left knee- that was from Dr. Mayer Camel. He also pulled fluid off. It does help with pain, but she does not want another given her worsening range of motion afterward. She does have improved sensation in her knee after the injection! -has been hurting -discussed Qutenza as a treatment option -her current regimen is working for her.  -has been having digestive issues and stopped meloxicam.   2) Anxiety: This has much improved with the Gabapentin.  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  -she has used blue emu oil and diclofenac gel without great benefit -has appointment with physician today.  -she does use her hands a lot.  -she has been dropping things recently.    9) lower back pain -got Xrs and was shown to have cyst on right side and bulging disc on left side and pinched nerve in the middle.  -she feels a constant burning -worst on left side -feels she is walking crooked -she had an injection by Dr. Jacelyn Grip at orthopedics but these did not help.  -she know has a bone further up that is bothering her.  -left pain in her boy friend's car so had a hard time getting in today -hurts severely sometimes -pinches when she walks -sitting also hurts -she does not have insurance right now -ice helps -she uses heat for the muscular pain -hurt it while bending and turning -has been worse since she carried her grandchild -she is unable to vacuum.  -discussed therapy and using lidocaine patch, massage.  -she is ok using gabapentin and her muscle relaxers.  -found lidocaine patch to be a lifesaver while she was at the beach.  -ready to try Qutenza today -has been severe recently -plans to follow-up with her orthopedic surgeon.   10) Constipation -has been needing to take laxatives.   11) shortness of breath -worried abut blot clots -she asks about results of her vascular ultrasound of her lower extremity and what treatment is necessary, how she would know if there are clots elsewhere in her body, and how she may have developed these clots.  -she has being set up for tests  12) impaired memory  Prior history:  Since last visit  she has stopped Norco as it caused respiratory distress. We tried Cymbalta '40mg'$  without benefit or side effects. She has tried voltaren gel, blue emu oil without benefit. She has tried CBD.   She has been trying to improve quadriceps musculature. She discusses some of the exercises she does.   She has been denied in her disability. She has talked to an attorney.  She continues to ambulate with a cane.   She has tried the compounding cream and that does not help much. She has been trying Hemp cream which is helping.  She is  getting foot insoles from the podiatrist. They should be ready in a week or two.   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   Can ride the stationary bicycle and that does not hurt. She sues it every day. She uses leg weights. When   Scar tissue grew back quickly post-surgery. She could not get PT appointments for 3 weeks.   Her surgeon does not want her to break the hardware and he may have to do arthroscopic procedure to flush out the inflammation.   She has tried ice which stopped helping, nabumetone 750 mg BID, Norco 1-2 tablets three times per day PRN (she has 12 left). She uses diclofenac gel   She takes Miralax for constipation. She takes up to 2 times per day. She has a history of IBS.   [facility] in [place]- she was a Research scientist (physical sciences). They let her go because she did not return in 12 weeks.   She was doing pool therapy but chemicals in the pool were increased because of COVID.  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 Notes reviewed- recently had a right subacromial injection.   Current pain is 8/10.   12) Hip pain   Pain Inventory Average Pain 9 Pain Right Now 9 My pain is constant, sharp, burning, dull, stabbing, and aching  In the last 24 hours, has pain interfered with the following? General activity 9 Relation with others 10 Enjoyment of life 10 What TIME of day is your pain at its worst? evening Sleep (in general) Fair  Pain is worse with: walking, bending, sitting, inactivity, and standing Pain improves with: rest, heat/ice, and medication Relief from Meds: 7     Family History  Problem Relation Age of Onset   Emphysema Mother        smoker   Allergies Mother    Asthma Mother    Heart disease Mother        AMI as cause of death   COPD Mother    Asthma Father    Heart disease Father        AMI as cause of death   Diabetes Father     Asthma Brother    Heart disease Brother 65       heart failure   Lung cancer Maternal Grandfather        was a smoker   Cancer Maternal Grandfather        lung   Heart disease Paternal Grandmother        AMI   Heart disease Paternal Grandfather        AMI   Colon cancer Neg Hx    Social History   Socioeconomic History   Marital status: Divorced    Spouse name: Not on file   Number  of children: 1   Years of education: Not on file   Highest education level: Not on file  Occupational History   Occupation: Restaurant manager, fast food: [employer]  Tobacco Use   Smoking status: Never   Smokeless tobacco: Never  Vaping Use   Vaping Use: Never used  Substance and Sexual Activity   Alcohol use: Not Currently    Comment: occ   Drug use: No    Comment: former maryjuana use- quit in 1995   Sexual activity: Yes    Partners: Male    Birth control/protection: Surgical  Other Topics Concern   Not on file  Social History Narrative   Marital status: divorced x 2; dating seriously x 3 years.        Children:  1 daughter (9); no grandchildren      Lives:  With boyfriend.        Employment: works at [employer]: none      Alcohol: rarely; socially      Exercise: none; has treadmill and elliptical and bikes   Coffee in am / 1/2 of coke in afternoon    High school education      01/29/20   From: the area   Living: alone - but boyfriend living with her Wilfred Lacy (2020) but knew each other in high school   Work: on disability - working on appeal      Family: one daughter Sharon [name] - one grandchild coming in January       Enjoys: relax and watch tv, use to like fishing/boating/rafting, reading, crochet       Exercise: rehab exercises   Diet: not great - limited intake      Safety   Seat belts: Yes    Guns: Yes  and secure   Right handed   One story home   Drinks caffeine   Social Determinants of Health   Financial Resource Strain: Not on  file  Food Insecurity: Not on file  Transportation Needs: Not on file  Physical Activity: Not on file  Stress: Not on file  Social Connections: Not on file   Past Surgical History:  Procedure Laterality Date   ABDOMINAL HYSTERECTOMY     still with both ovaries   CATARACT EXTRACTION Bilateral 2015   COLONOSCOPY     CYSTOSCOPY W/ URETERAL STENT PLACEMENT Right 05/23/2017   Procedure: CYSTOSCOPY WITH RETROGRADE PYELOGRAM/URETERAL RIGHT STENT PLACEMENT;  Surgeon: Bjorn Loser, MD;  Location: [facility];  Service: Urology;  Laterality: Right;   CYSTOSCOPY W/ URETERAL STENT PLACEMENT Right 06/04/2017   Procedure: CYSTOSCOPY WITHRIGHT RETROGRADE PYELOGRAMRIGHT /URETEREOSCOPY  STENT PLACEMENT;  Surgeon: Lucas Mallow, MD;  Location: [facility];  Service: Urology;  Laterality: Right;   ENDOMETRIAL ABLATION     HEMORRHOID SURGERY  2011   INCONTINENCE SURGERY     INNER EAR SURGERY     X 2   RECTAL PROLAPSE REPAIR     RETINAL DETACHMENT SURGERY Bilateral 2000   TOTAL KNEE ARTHROPLASTY Left 04/29/2019   Procedure: LEFT TOTAL KNEE ARTHROPLASTY;  Surgeon: Mcarthur Rossetti, MD;  Location: [place];  Service: Orthopedics;  Laterality: Left;   TUBAL LIGATION     VEIN SURGERY Left 04/2010   Past Medical History:  Diagnosis Date   Allergy    Anal fissure    Anemia    borderline   Anxiety    Arthritis    Blood clot  in vein    left leg   Detached retina    BOTH SCLERA BUCKLE BOTH EYES   DVT (deep venous thrombosis) (HCC)    left leg   Endometriosis    Family history of adverse reaction to anesthesia    DAUGHTER POST OP PONV   GERD (gastroesophageal reflux disease)    Glaucoma    RIGHT WORST THAN LEFT   Heart murmur    "caused by anxiety"    History of hemorrhoids    History of kidney stones    Hypertension    IBS (irritable bowel syndrome)    Perforated eardrum, right    SMALL HOLE   PONV (postoperative nausea and vomiting)    Pulmonary embolism (HCC) 6  YRS AGO   Status post arthroscopy of left knee 08/06/2017   Ht 1' (0.305 m)   Wt 172 lb (78 kg)   LMP 10/11/2010   BMI 839.79 kg/m   Opioid Risk Score:   Fall Risk Score:  `1  Depression screen [facility] 2/9     10/12/2021   11:35 AM 08/02/2021   11:31 AM 04/13/2021   12:05 PM 04/08/2021    2:57 PM 10/07/2020    2:29 PM 04/09/2020    3:03 PM 01/29/2020   11:34 AM  Depression screen PHQ 2/9  Decreased Interest '3 1 2 '$ 0 3 0 3  Down, Depressed, Hopeless '1 1 2 '$ 0 2 0 2  PHQ - 2 Score '4 2 4 '$ 0 5 0 5  Altered sleeping 1  2  0  2  Tired, decreased energy '3  2  2  2  '$ Change in appetite 0  '2  3  2  '$ Feeling bad or failure about yourself  0  0  0  0  Trouble concentrating 0  0  0  1  Moving slowly or fidgety/restless 0  0  0  0  Suicidal thoughts 0  0  0  0  PHQ-9 Score '8  10  10  12  '$ Difficult doing work/chores Somewhat difficult  Extremely dIfficult  Somewhat difficult  Very difficult   Review of Systems  Constitutional: Negative.        Weight loss  HENT: Negative.   Eyes: Negative.   Respiratory: Positive for shortness of breath.   Cardiovascular: Negative.   Gastrointestinal: Negative.   Endocrine: Negative.   Genitourinary: Negative.   Musculoskeletal: Positive for arthralgias, back pain and gait problem.       Spasms  Skin: Negative.   Allergic/Immunologic: Negative.   Neurological: Positive for tremors and numbness.  Hematological: Negative.   Psychiatric/Behavioral:       Anxiety  All other systems reviewed and are negative.      Objective:   Not performed    Assessment & Plan:  Sharon [name] is a 56 year old woman who presents for follow-up:   1) Left knee pain post-surgery: -She has a plan for repeat surgical procedure with Dr. Ninfa Linden that will hopefully provide relief.  -She has been icing regularly- recommended three tines per day for 15 minutes. Discussed that this decreases blood flow and can impair healing but help with swelling and pain relief.  -discussed  extracorporeal shockwave therapy  -unable to work due the level of her pain -She has been through physical therapy and performs her exercises diligently. --Discussed Sprint PNS system as an option of pain treatment via neuromodulation. Provided following link for patient to learn more about the system: https://www.sprtherapeutics.com/. She does  not like the feeling of things under her skin and defers that option.  -She weaned herself all the steroids.  -Provided with a pain relief journal and discussed that it contains foods and lifestyle tips to naturally help to improve pain. Discussed that these lifestyle strategies are also very good for health unlike some medications which can have negative side effects. Discussed that the act of keeping a journal can be therapeutic and helpful to realize patterns what helps to trigger and alleviate pain.   -discussed mechanism of action of low dose naltrexone as an opioid receptor antagonist which stimulates your body's production of its own natural endogenous opioids, helping to decrease pain. Discussed that it can also decrease T cell response and thus be helpful in decreasing inflammation, and symptoms of brain fog, fatigue, anxiety, depression, and allergies. Discussed that this medication needs to be compounded at a compounding pharmacy and can more expensive. Discussed that I usually start at '1mg'$  and if this is not providing enough relief then I titrate upward on a monthly basis.   -Discussed Qutenza as an option for neuropathic pain control. Discussed that this is a capsaicin patch, stronger than capsaicin cream. Discussed that it is currently approved for diabetic peripheral neuropathy and post-herpetic neuralgia, but that it has also shown benefit in treating other forms of neuropathy. Provided patient with link to site to learn more about the patch: CinemaBon[employer].fr. Discussed that the patch would be placed in office and benefits usually last 3 months.  Discussed that unintended exposure to capsaicin can cause severe irritation of eyes, mucous membranes, respiratory tract, and skin, but that Qutenza is a local treatment and does not have the systemic side effects of other nerve medications. Discussed that there may be pain, itching, erythema, and decreased sensory function associated with the application of Qutenza. Side effects usually subside within 1 week. A cold pack of analgesic medications can help with these side effects. Blood pressure can also be increased due to pain associated with administration of the patch.  -continue ice     -Refilled Robaxin as she is nervous about feeling too sleepy with the Tizanidine, and the Robaxin does help.   -Continue abapentin to '600mg'$  TID PRN.   -Continue neoprene knee sleeve for proprioception  -She has tried Celebrex and Tramadol without benefit. She had respiratory distress with hydrocodone.   -She had worse pain with steroid injection in her left knee.   -Discussed steroids but she did not like the side effect profile. Discussed the different steroid options. She weaned off these because she developed mouth sores.   -Continue turmeric and cherries.   -Continue Lidocaine patches today.   -She would like rheum panel checked: ordered, reviewed results which were negative, and discussed with patient.  -Refilled meloxicam.   2) Bunion:  -continue orthotics  3) Irritable bowel syndrome: -She does not eat much sugar or sweets.  -She drinks 1 coke per day.  -She cuts tea out.  -She drinks water, coffee.  -She has been going more regularly and has not needed her Miralax.   4) Impaired Concentration: - This persists -She forgets things easily.  -She tries not to multitask when she was with her grandmother.   5) Anxiety: The Gabapentin is helping with this as well, continue  6) Vulvodynia: -discussed various treatment options: biofeedback, medications, continue estradiol.  -continue  Amitriptyline '10mg'$  HS, discussed moving dose earlier in the evening.   7) Cervical and lumbar myofascial pain: -try massage -discussed benefits of therapy  and trigger point injections -apply lidocaine patch.   8) Bilateral hand pain: likely secondary to arthritis compounded by carpal tunnel syndrome -continue Gabapentin which is helping -continue f/u with surgery as planned.   9) Low back pain, appears to be secondary to face arthritis -discussed XR, but she defers due to potential cost -discussed surgery with an orthopedic and she would like to consider this but her insurance would not cover it.  -discussed lack of improvement with injections from Dr. Jacelyn Grip.  -continue lidocaine patches -continue postural correction -Provided with a pain relief journal and discussed that it contains foods and lifestyle tips to naturally help to improve pain. Discussed that these lifestyle strategies are also very good for health unlike some medications which can have negative side effects. Discussed that the act of keeping a journal can be therapeutic and helpful to realize patterns what helps to trigger and alleviate pain.   -Discussed following foods that may reduce pain: 1) Ginger (especially studied for arthritis)- reduce leukotriene production to decrease inflammation 2) Blueberries- high in phytonutrients that decrease inflammation 3) Salmon- marine omega-3s reduce joint swelling and pain 4) Pumpkin seeds- reduce inflammation 5) dark chocolate- reduces inflammation 6) turmeric- reduces inflammation 7) tart cherries - reduce pain and stiffness 8) extra virgin olive oil - its compound olecanthal helps to block prostaglandins  9) chili peppers- can be eaten or applied topically via capsaicin 10) mint- helpful for headache, muscle aches, joint pain, and itching 11) garlic- reduces inflammation  Link to further information on diet for chronic pain:  http://www.randall.com/   10) shoulder pain -used to get shots in the pt, but laying on her back has helped to decrease her shoulder pain  11) Acid reflux -continues famotidine.   11) Soreness in legs -vascular ultrasound ordered of both legs -discussed her history of clots -discussed that a D-dimer test can suggest clot if elevated.   12) Irritable bowel syndrome -discussed her current diet -discussed that she does not eat breakfast due to nausea -discussed her diarrhea and constipation, more diarrheal type at this time.  -discussed her aunt's advise to have a tsp coconut oil daily.  -recommended bioptemizer's magnesium breakthrough  13) Insomnia/Shortness of breath with history of clots -discussed that there is no evidence of PE on her CTA -discussed her follow-up with PCP -discussed that her vascular ultrasound of her lower extremities shows a chronic left peroneal CVT  -recommended starting nattokinase to help thin the blood and lower her risk for clots, or to eat natto -discussed the importance of movement in clot prevention -referred for sleep study for shortness of breath  14) Impaired memory: -discussed her memory difficulties.   15) Hemorroids: Recommended Epsom salt baths

## 2022-03-28 NOTE — Addendum Note (Signed)
Addended by: Izora Ribas on: 03/28/2022 11:05 AM   Modules accepted: Orders

## 2022-03-28 NOTE — Assessment & Plan Note (Signed)
Ordering b12 Can consider requip and or magnesium supplement in the future

## 2022-03-28 NOTE — Patient Instructions (Addendum)
Clonidine    Bioptemizer's Magnesium supplement

## 2022-03-28 NOTE — Assessment & Plan Note (Signed)
ordering b12 and cbc pending results

## 2022-03-28 NOTE — Patient Instructions (Signed)
Check at home are you taking duloxetine?

## 2022-03-28 NOTE — Assessment & Plan Note (Addendum)
Ordering tsh to r/o thyroid disease  Pending results Try less weight bearing exercises to try to lose weight such as swimming  Numerous concerns in office totaling 46 minutes in the clinic going over acute and chronic concerns

## 2022-03-28 NOTE — Assessment & Plan Note (Signed)
rx clobetasol solution for scalp rx sent in

## 2022-03-28 NOTE — Assessment & Plan Note (Addendum)
D/w pt risk factors and possible other options for anxiety control Pt declines at this time Can maybe consider lexapro and or remeron in the future? Might help with sleep as well.   pdmp reviewed, can refill around 10/20. Advised pt to send me refill reminder close to that date.

## 2022-03-29 ENCOUNTER — Other Ambulatory Visit: Payer: Self-pay | Admitting: Family Medicine

## 2022-03-29 ENCOUNTER — Encounter (HOSPITAL_BASED_OUTPATIENT_CLINIC_OR_DEPARTMENT_OTHER): Payer: No Typology Code available for payment source | Admitting: Physical Medicine and Rehabilitation

## 2022-03-29 ENCOUNTER — Telehealth: Payer: Self-pay | Admitting: Family

## 2022-03-29 DIAGNOSIS — M25562 Pain in left knee: Secondary | ICD-10-CM

## 2022-03-29 DIAGNOSIS — K589 Irritable bowel syndrome without diarrhea: Secondary | ICD-10-CM

## 2022-03-29 DIAGNOSIS — R7303 Prediabetes: Secondary | ICD-10-CM | POA: Diagnosis not present

## 2022-03-29 DIAGNOSIS — I10 Essential (primary) hypertension: Secondary | ICD-10-CM

## 2022-03-29 DIAGNOSIS — G8929 Other chronic pain: Secondary | ICD-10-CM | POA: Diagnosis not present

## 2022-03-29 LAB — CBC
HCT: 40 % (ref 36.0–46.0)
Hemoglobin: 13.3 g/dL (ref 12.0–15.0)
MCHC: 33.3 g/dL (ref 30.0–36.0)
MCV: 88.5 fl (ref 78.0–100.0)
Platelets: 285 10*3/uL (ref 150.0–400.0)
RBC: 4.52 Mil/uL (ref 3.87–5.11)
RDW: 13.5 % (ref 11.5–15.5)
WBC: 6.9 10*3/uL (ref 4.0–10.5)

## 2022-03-29 LAB — COMPREHENSIVE METABOLIC PANEL
ALT: 37 U/L — ABNORMAL HIGH (ref 0–35)
AST: 23 U/L (ref 0–37)
Albumin: 4.4 g/dL (ref 3.5–5.2)
Alkaline Phosphatase: 86 U/L (ref 39–117)
BUN: 11 mg/dL (ref 6–23)
CO2: 31 mEq/L (ref 19–32)
Calcium: 9.8 mg/dL (ref 8.4–10.5)
Chloride: 101 mEq/L (ref 96–112)
Creatinine, Ser: 0.75 mg/dL (ref 0.40–1.20)
GFR: 88.28 mL/min (ref >60.00)
Glucose, Bld: 75 mg/dL (ref 70–99)
Potassium: 4.4 mEq/L (ref 3.5–5.1)
Sodium: 140 mEq/L (ref 135–145)
Total Bilirubin: 0.5 mg/dL (ref 0.2–1.2)
Total Protein: 7.1 g/dL (ref 6.0–8.3)

## 2022-03-29 LAB — HEMOGLOBIN A1C
Est. average glucose Bld gHb Est-mCnc: 117 mg/dL
Hgb A1c MFr Bld: 5.7 % — ABNORMAL HIGH (ref 4.8–5.6)

## 2022-03-29 LAB — TSH: TSH: 1.8 u[IU]/mL (ref 0.35–5.50)

## 2022-03-29 LAB — B12 AND FOLATE PANEL
Folate: 9.2 ng/mL (ref >5.9)
Vitamin B-12: 553 pg/mL (ref 211–911)

## 2022-03-29 NOTE — Telephone Encounter (Signed)
Patient called in stating that 1 of her bottles of rx nebivolol (BYSTOLIC) 5 MG tabletwas misplaced,she already took 2 of her bottles,she would like to know if one bottle could be called in for her?

## 2022-03-29 NOTE — Telephone Encounter (Signed)
  Encourage patient to contact the pharmacy for refills or they can request refills through [facility]  Did the patient contact the pharmacy:  yes   LAST APPOINTMENT DATE:03/28/22  NEXT APPOINTMENT DATE:  MEDICATION:nebivolol (BYSTOLIC) 5 MG tablet  Is the patient out of medication?   If not, how much is left?  Is this a 90 day supply:   [id number] [place]  [phone] Fax:  [phone]     Let patient know to contact pharmacy at the end of the day to make sure medication is ready.  Please notify patient to allow 48-72 hours to process

## 2022-03-29 NOTE — Progress Notes (Signed)
Subjective:    Patient ID: Sharon [name], female    DOB: 1966-06-14, 56 y.o.   MRN: [id number]  HPI  An audio/video tele-health visit is felt to be the most appropriate encounter for this patient at this time. This is a follow up tele-visit via phone. The patient is at home. MD is at office. Prior to scheduling this appointment, our staff discussed the limitations of evaluation and management by telemedicine and the availability of in-person appointments. The patient expressed understanding and agreed to proceed.   [name] is a 56 year old woman who presents for f/u bilateral knee pain following left knee replacement in 04/2019, as well as shortness of breath.   1) Bilateral knee pain L>R: -right knee pain has been getting worse -Her ROM is still restricted in her left knee.  -ice helps -radiates between hip and left knee Feels like a charley horse -She asks whether the scar tissue will ever heal.  -Cannot sleep without a pillow underneath her legs.  -She has follow-up with Dr. Mayer Camel and Dr. Ninfa Linden -Currently is not planning to pursue surgery.  -She has been off hydrocodone.  -She would be interested in increasing the Gabapentin dose.  -She would like to discuss an option for inflammation. She has tried aspirations but the fluid comes back right away.  -She got a  Steroid injection and experienced worsening range of motion in her left knee- that was from Dr. Mayer Camel. He also pulled fluid off. It does help with pain, but she does not want another given her worsening range of motion afterward. She does have improved sensation in her knee after the injection! -has been hurting -discussed Qutenza as a treatment option -her current regimen is working for her.  -has been having digestive issues and stopped meloxicam.   2) Anxiety: This has much improved with the Gabapentin.  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  -she has used blue emu oil and diclofenac gel without great benefit -has appointment with physician today.  -she does use her hands a lot.  -she has been dropping things recently.   9) lower back pain -got Xrs and was shown to have cyst on right side and bulging disc on left side and pinched nerve in the middle.  -she feels a constant burning -worst on left side -feels she is walking crooked -she had an injection by Dr. Jacelyn Grip at orthopedics but these did not help.  -she know has a bone further up that is bothering her.  -left pain in her boy friend's car so had a hard time getting in today -hurts severely sometimes -pinches when she walks -sitting also hurts -she does not have insurance right now -ice helps -she uses heat for the muscular pain -hurt it while bending and turning -has been worse since she carried her grandchild -she is unable to vacuum.  -discussed therapy and using lidocaine patch, massage.  -she is ok using gabapentin and her muscle relaxers.  -found lidocaine patch to be a lifesaver while she was at the beach.  -ready to try Qutenza today -has been severe recently -plans to follow-up with her orthopedic surgeon.   10) Constipation -has been needing to take laxatives.  11) shortness of breath -worried abut blot clots -she asks about results of her vascular ultrasound of her lower extremity and what treatment is necessary, how she would know if there are clots elsewhere in her body, and how she may have developed these clots.  -she has being set up for tests  12) impaired memory  13) Prediabetes -she asks about HgbA1c test result  Prior history:  Since last visit she has stopped Norco as it caused respiratory distress. We  tried Cymbalta '40mg'$  without benefit or side effects. She has tried voltaren gel, blue emu oil without benefit. She has tried CBD.   She has been trying to improve quadriceps musculature. She discusses some of the exercises she does.   She has been denied in her disability. She has talked to an attorney.  She continues to ambulate with a cane.   She has tried the compounding cream and that does not help much. She has been trying Hemp cream which is helping.  She is getting foot insoles from the podiatrist. They should be ready in a week or two.   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   Can ride the stationary bicycle and that does not hurt. She sues it every day. She uses leg weights. When   Scar tissue grew back quickly post-surgery. She could not get PT appointments for 3 weeks.   Her surgeon does not want her to break the hardware and he may have to do arthroscopic procedure to flush out the inflammation.   She has tried ice which stopped helping, nabumetone 750 mg BID, Norco 1-2 tablets three times per day PRN (she has 12 left). She uses diclofenac gel   She takes Miralax for constipation. She takes up to 2 times per day. She has a history of IBS.   [facility] in [place]- she was a Research scientist (physical sciences). They let her go because she did not return in 12 weeks.   She was doing pool therapy but chemicals in the pool were increased because of COVID.  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 Notes reviewed- recently had a right subacromial injection.   Current pain is 8/10.   12) Hip pain   Pain Inventory Average Pain 9 Pain Right Now 9 My pain is constant, sharp, burning, dull, stabbing, and aching  In the last 24 hours, has pain interfered with the following? General activity 9 Relation with others 10 Enjoyment of life 10 What TIME of day is your pain at its  worst? evening Sleep (in general) Fair  Pain is worse with: walking, bending, sitting, inactivity, and standing Pain improves with: rest, heat/ice, and medication Relief from Meds: 7     Family History  Problem Relation Age of Onset   Emphysema Mother        smoker   Allergies Mother    Asthma Mother    Heart disease Mother        AMI as cause of death   COPD Mother    Asthma Father    Heart disease Father        AMI as cause of death   Diabetes Father    Asthma Brother    Heart disease Brother 41       heart failure   Lung cancer Maternal Grandfather        was a  smoker   Cancer Maternal Grandfather        lung   Heart disease Paternal Grandmother        AMI   Heart disease Paternal Grandfather        AMI   Colon cancer Neg Hx    Social History   Socioeconomic History   Marital status: Divorced    Spouse name: Not on file   Number of children: 1   Years of education: Not on file   Highest education level: Not on file  Occupational History   Occupation: Restaurant manager, fast food: [employer]  Tobacco Use   Smoking status: Never   Smokeless tobacco: Never  Vaping Use   Vaping Use: Never used  Substance and Sexual Activity   Alcohol use: Not Currently    Comment: occ   Drug use: No    Comment: former maryjuana use- quit in 1995   Sexual activity: Yes    Partners: Male    Birth control/protection: Surgical  Other Topics Concern   Not on file  Social History Narrative   Marital status: divorced x 2; dating seriously x 3 years.        Children:  1 daughter (74); no grandchildren      Lives:  With boyfriend.        Employment: works at [employer]: none      Alcohol: rarely; socially      Exercise: none; has treadmill and elliptical and bikes   Coffee in am / 1/2 of coke in afternoon    High school education      01/29/20   From: the area   Living: alone - but boyfriend living with her Wilfred Lacy (2020) but knew each other in  high school   Work: on disability - working on appeal      Family: one daughter [name] - one grandchild coming in January       Enjoys: relax and watch tv, use to like fishing/boating/rafting, reading, crochet       Exercise: rehab exercises   Diet: not great - limited intake      Safety   Seat belts: Yes    Guns: Yes  and secure   Right handed   One story home   Drinks caffeine   Social Determinants of Health   Financial Resource Strain: Not on file  Food Insecurity: Not on file  Transportation Needs: Not on file  Physical Activity: Not on file  Stress: Not on file  Social Connections: Not on file   Past Surgical History:  Procedure Laterality Date   ABDOMINAL HYSTERECTOMY     still with both ovaries   CATARACT EXTRACTION Bilateral 2015   COLONOSCOPY     CYSTOSCOPY W/ URETERAL STENT PLACEMENT Right 05/23/2017   Procedure: CYSTOSCOPY WITH RETROGRADE PYELOGRAM/URETERAL RIGHT STENT PLACEMENT;  Surgeon: Bjorn Loser, MD;  Location: [facility];  Service: Urology;  Laterality: Right;   CYSTOSCOPY W/ URETERAL STENT PLACEMENT Right 06/04/2017   Procedure: CYSTOSCOPY WITHRIGHT RETROGRADE PYELOGRAMRIGHT /URETEREOSCOPY  STENT PLACEMENT;  Surgeon: Lucas Mallow, MD;  [facility]: [facility];  Service: Urology;  Laterality: Right;   ENDOMETRIAL ABLATION     HEMORRHOID SURGERY  2011   INCONTINENCE SURGERY     INNER EAR SURGERY     X 2   RECTAL PROLAPSE REPAIR     RETINAL DETACHMENT SURGERY Bilateral 2000   TOTAL KNEE ARTHROPLASTY  Left 04/29/2019   Procedure: LEFT TOTAL KNEE ARTHROPLASTY;  Surgeon: Mcarthur Rossetti, MD;  Location: [facility];  Service: Orthopedics;  Laterality: Left;   TUBAL LIGATION     VEIN SURGERY Left 04/2010   Past Medical History:  Diagnosis Date   Allergy    Anal fissure    Anemia    borderline   Anxiety    Arthritis    Blood clot in vein    left leg   Detached retina    BOTH SCLERA BUCKLE BOTH EYES   DVT (deep  venous thrombosis) (HCC)    left leg   Endometriosis    Family history of adverse reaction to anesthesia    DAUGHTER POST OP PONV   GERD (gastroesophageal reflux disease)    Glaucoma    RIGHT WORST THAN LEFT   Heart murmur    "caused by anxiety"    History of hemorrhoids    History of kidney stones    Hypertension    IBS (irritable bowel syndrome)    Perforated eardrum, right    SMALL HOLE   PONV (postoperative nausea and vomiting)    Pulmonary embolism (HCC) 6 YRS AGO   Status post arthroscopy of left knee 08/06/2017   LMP 10/11/2010   Opioid Risk Score:   Fall Risk Score:  `1  Depression screen [facility] 2 2/9     03/28/2022   10:38 AM 10/12/2021   11:35 AM 08/02/2021   11:31 AM 04/13/2021   12:05 PM 04/08/2021    2:57 PM 10/07/2020    2:29 PM 04/09/2020    3:03 PM  Depression screen PHQ 2/9  Decreased Interest '1 3 1 2 '$ 0 3 0  Down, Depressed, Hopeless '1 1 1 2 '$ 0 2 0  PHQ - 2 Score '2 4 2 4 '$ 0 5 0  Altered sleeping  1  2  0   Tired, decreased energy  '3  2  2   '$ Change in appetite  0  2  3   Feeling bad or failure about yourself   0  0  0   Trouble concentrating  0  0  0   Moving slowly or fidgety/restless  0  0  0   Suicidal thoughts  0  0  0   PHQ-9 Score  '8  10  10   '$ Difficult doing work/chores  Somewhat difficult  Extremely dIfficult  Somewhat difficult    Review of Systems  Constitutional: Negative.        Weight loss  HENT: Negative.   Eyes: Negative.   Respiratory: Positive for shortness of breath.   Cardiovascular: Negative.   Gastrointestinal: Negative.   Endocrine: Negative.   Genitourinary: Negative.   Musculoskeletal: Positive for arthralgias, back pain and gait problem.       Spasms  Skin: Negative.   Allergic/Immunologic: Negative.   Neurological: Positive for tremors and numbness.  Hematological: Negative.   Psychiatric/Behavioral:       Anxiety  All other systems reviewed and are negative.      Objective:   Not performed    Assessment & Plan:   Mrs. Franson is a 55 year old woman who presents for follow-up:   1) Left knee pain post-surgery: -She has a plan for repeat surgical procedure with Dr. Ninfa Linden that will hopefully provide relief.  -She has been icing regularly- recommended three tines per day for 15 minutes. Discussed that this decreases blood flow and can impair healing but help with swelling and  pain relief.  -discussed extracorporeal shockwave therapy and how this can help break apart scar tissue -unable to work due the level of her pain -She has been through physical therapy and performs her exercises diligently. --Discussed Sprint PNS system as an option of pain treatment via neuromodulation. Provided following link for patient to learn more about the system: https://www.sprtherapeutics.com/. She does not like the feeling of things under her skin and defers that option.  -She weaned herself all the steroids.  -Provided with a pain relief journal and discussed that it contains foods and lifestyle tips to naturally help to improve pain. Discussed that these lifestyle strategies are also very good for health unlike some medications which can have negative side effects. Discussed that the act of keeping a journal can be therapeutic and helpful to realize patterns what helps to trigger and alleviate pain.   -discussed mechanism of action of low dose naltrexone as an opioid receptor antagonist which stimulates your body's production of its own natural endogenous opioids, helping to decrease pain. Discussed that it can also decrease T cell response and thus be helpful in decreasing inflammation, and symptoms of brain fog, fatigue, anxiety, depression, and allergies. Discussed that this medication needs to be compounded at a compounding pharmacy and can more expensive. Discussed that I usually start at '1mg'$  and if this is not providing enough relief then I titrate upward on a monthly basis.   -Discussed Qutenza as an option for  neuropathic pain control. Discussed that this is a capsaicin patch, stronger than capsaicin cream. Discussed that it is currently approved for diabetic peripheral neuropathy and post-herpetic neuralgia, but that it has also shown benefit in treating other forms of neuropathy. Provided patient with link to site to learn more about the patch: Cinema[employer].fr. Discussed that the patch would be placed in office and benefits usually last 3 months. Discussed that unintended exposure to capsaicin can cause severe irritation of eyes, mucous membranes, respiratory tract, and skin, but that Qutenza is a local treatment and does not have the systemic side effects of other nerve medications. Discussed that there may be pain, itching, erythema, and decreased sensory function associated with the application of Qutenza. Side effects usually subside within 1 week. A cold pack of analgesic medications can help with these side effects. Blood pressure can also be increased due to pain associated with administration of the patch.  -continue ice     -Refilled Robaxin as she is nervous about feeling too sleepy with the Tizanidine, and the Robaxin does help.   -Continue abapentin to '600mg'$  TID PRN.   -Continue neoprene knee sleeve for proprioception  -She has tried Celebrex and Tramadol without benefit. She had respiratory distress with hydrocodone.   -She had worse pain with steroid injection in her left knee.   -Discussed steroids but she did not like the side effect profile. Discussed the different steroid options. She weaned off these because she developed mouth sores.   -Continue turmeric and cherries.   -Continue Lidocaine patches today.   -She would like rheum panel checked: ordered, reviewed results which were negative, and discussed with patient.  -Refilled meloxicam.   2) Bunion:  -continue orthotics  3) Irritable bowel syndrome: -She does not eat much sugar or sweets.  -She drinks 1 coke per  day.  -She cuts tea out.  -She drinks water, coffee.  -She has been going more regularly and has not needed her Miralax.  -food allergy testing ordered  4) Impaired Concentration: - This persists -  She forgets things easily.  -She tries not to multitask when she was with her grandmother.   5) Anxiety: The Gabapentin is helping with this as well, continue  6) Vulvodynia: -discussed various treatment options: biofeedback, medications, continue estradiol.  -continue Amitriptyline '10mg'$  HS, discussed moving dose earlier in the evening.   7) Cervical and lumbar myofascial pain: -try massage -discussed benefits of therapy and trigger point injections -apply lidocaine patch.   8) Bilateral hand pain: likely secondary to arthritis compounded by carpal tunnel syndrome -continue Gabapentin which is helping -continue f/u with surgery as planned.   9) Low back pain, appears to be secondary to face arthritis -discussed XR, but she defers due to potential cost -discussed surgery with an orthopedic and she would like to consider this but her insurance would not cover it.  -discussed lack of improvement with injections from Dr. Jacelyn Grip.  -continue lidocaine patches -continue postural correction -Provided with a pain relief journal and discussed that it contains foods and lifestyle tips to naturally help to improve pain. Discussed that these lifestyle strategies are also very good for health unlike some medications which can have negative side effects. Discussed that the act of keeping a journal can be therapeutic and helpful to realize patterns what helps to trigger and alleviate pain.   -Discussed following foods that may reduce pain: 1) Ginger (especially studied for arthritis)- reduce leukotriene production to decrease inflammation 2) Blueberries- high in phytonutrients that decrease inflammation 3) Salmon- marine omega-3s reduce joint swelling and pain 4) Pumpkin seeds- reduce inflammation 5)  dark chocolate- reduces inflammation 6) turmeric- reduces inflammation 7) tart cherries - reduce pain and stiffness 8) extra virgin olive oil - its compound olecanthal helps to block prostaglandins  9) chili peppers- can be eaten or applied topically via capsaicin 10) mint- helpful for headache, muscle aches, joint pain, and itching 11) garlic- reduces inflammation  Link to further information on diet for chronic pain: http://www.randall.com/   10) shoulder pain -used to get shots in the pt, but laying on her back has helped to decrease her shoulder pain  11) Acid reflux -continues famotidine.   11) Soreness in legs -vascular ultrasound ordered of both legs -discussed her history of clots -discussed that a D-dimer test can suggest clot if elevated.   12) Irritable bowel syndrome -discussed her current diet -discussed that she does not eat breakfast due to nausea -discussed her diarrhea and constipation, more diarrheal type at this time.  -discussed her aunt's advise to have a tsp coconut oil daily.  -recommended bioptemizer's magnesium breakthrough  13) Insomnia/Shortness of breath with history of clots -discussed that there is no evidence of PE on her CTA -discussed her follow-up with PCP -discussed that her vascular ultrasound of her lower extremities shows a chronic left peroneal CVT  -recommended starting nattokinase to help thin the blood and lower her risk for clots, or to eat natto -discussed the importance of movement in clot prevention -referred for sleep study for shortness of breath  14) Impaired memory: -discussed her memory difficulties.   15) Hemorroids: Recommended Epsom salt baths  16) prediabetes - recommended avoiding processed foods/added sugars/bread/pasta/rice -discussed that hgbA1c has increased to 5.7 -recommended drinking a glass of water with either apple cide vinegar or  lemon -recommended reading Wheat Belly and Grain Brain -recommended eating fruits, vegetables, yogurt, nuts, olive oil  17 minutes spent in discussion of her prediabetes, avoiding processed foods, added sugars, bread, pasta and rice, extracorporeal shockwave therapy for her knee pain, recommended  apple cider vinegar or lemon juice squeezed in water before meals, discussed increase in hemoglobin A1c to 5.7, recommended reading the books Wheat Belly and Grain Brain, recommended no snacking after dinner

## 2022-03-31 ENCOUNTER — Ambulatory Visit (HOSPITAL_COMMUNITY)
Admission: RE | Admit: 2022-03-31 | Discharge: 2022-03-31 | Disposition: A | Discharge status: Home / Self Care | Payer: Medicare Other | Source: Ambulatory Visit | Attending: Primary Care | Admitting: Primary Care

## 2022-03-31 DIAGNOSIS — R062 Wheezing: Secondary | ICD-10-CM | POA: Insufficient documentation

## 2022-03-31 DIAGNOSIS — R0602 Shortness of breath: Secondary | ICD-10-CM | POA: Diagnosis not present

## 2022-03-31 DIAGNOSIS — R0789 Other chest pain: Secondary | ICD-10-CM | POA: Insufficient documentation

## 2022-03-31 LAB — PULMONARY FUNCTION TEST
DL/VA % pred: 92 %
DL/VA: 3.84 ml/min/mmHg/L
DLCO cor % pred: 89 %
DLCO cor: 20.14 ml/min/mmHg
DLCO unc % pred: 88 %
DLCO unc: 20.08 ml/min/mmHg
FEF 25-75 Post: 3.33 L/sec
FEF 25-75 Pre: 2.28 L/sec
FEF2575-%Change-Post: 45 %
FEF2575-%Pred-Post: 125 %
FEF2575-%Pred-Pre: 86 %
FEV1-%Change-Post: 7 %
FEV1-%Pred-Post: 107 %
FEV1-%Pred-Pre: 99 %
FEV1-Post: 3.15 L
FEV1-Pre: 2.92 L
FEV1FVC-%Change-Post: 7 %
FEV1FVC-%Pred-Pre: 96 %
FEV6-%Change-Post: 0 %
FEV6-%Pred-Post: 105 %
FEV6-%Pred-Pre: 105 %
FEV6-Post: 3.85 L
FEV6-Pre: 3.83 L
FEV6FVC-%Change-Post: 0 %
FEV6FVC-%Pred-Post: 102 %
FEV6FVC-%Pred-Pre: 102 %
FVC-%Change-Post: 0 %
FVC-%Pred-Post: 102 %
FVC-%Pred-Pre: 102 %
FVC-Post: 3.86 L
FVC-Pre: 3.87 L
Post FEV1/FVC ratio: 82 %
Post FEV6/FVC ratio: 100 %
Pre FEV1/FVC ratio: 76 %
Pre FEV6/FVC Ratio: 99 %
RV % pred: 100 %
RV: 2.08 L
TLC % pred: 107 %
TLC: 5.91 L

## 2022-03-31 MED ORDER — ALBUTEROL SULFATE (2.5 MG/3ML) 0.083% IN NEBU
2.5000 mg | INHALATION_SOLUTION | Freq: Once | RESPIRATORY_TRACT | Status: AC
  Administered 2022-03-31: 2.5 mg via RESPIRATORY_TRACT

## 2022-04-01 ENCOUNTER — Other Ambulatory Visit: Payer: Self-pay | Admitting: Family

## 2022-04-01 DIAGNOSIS — K219 Gastro-esophageal reflux disease without esophagitis: Secondary | ICD-10-CM

## 2022-04-03 NOTE — Progress Notes (Signed)
PFTs were normal. She needs to follow-up with sleep/pulm MD only after sleep test. She has seen Sood in the past but > 3 years. Ok to see new provider if he has nothing available- 30 min slot.

## 2022-04-11 ENCOUNTER — Other Ambulatory Visit: Payer: Self-pay | Admitting: Family

## 2022-04-11 ENCOUNTER — Encounter: Payer: Self-pay | Admitting: Family

## 2022-04-11 ENCOUNTER — Ambulatory Visit (INDEPENDENT_AMBULATORY_CARE_PROVIDER_SITE_OTHER): Payer: PRIVATE HEALTH INSURANCE | Admitting: Family

## 2022-04-11 VITALS — BP 128/80 | HR 76 | Temp 97.9°F | Resp 16 | Ht 67.5 in | Wt 170.1 lb

## 2022-04-11 DIAGNOSIS — I10 Essential (primary) hypertension: Secondary | ICD-10-CM

## 2022-04-11 DIAGNOSIS — R001 Bradycardia, unspecified: Secondary | ICD-10-CM | POA: Insufficient documentation

## 2022-04-11 DIAGNOSIS — R7989 Other specified abnormal findings of blood chemistry: Secondary | ICD-10-CM | POA: Diagnosis not present

## 2022-04-11 DIAGNOSIS — R7303 Prediabetes: Secondary | ICD-10-CM

## 2022-04-11 DIAGNOSIS — F411 Generalized anxiety disorder: Secondary | ICD-10-CM

## 2022-04-11 DIAGNOSIS — R002 Palpitations: Secondary | ICD-10-CM

## 2022-04-11 MED ORDER — CLONAZEPAM 0.5 MG PO TABS: 0.5000 mg | ORAL_TABLET | Freq: Every day | ORAL | 5 refills | Status: DC

## 2022-04-11 NOTE — Progress Notes (Unsigned)
Established Patient Office Visit  Subjective:  Patient ID: Sharon [name], female    DOB: Jun 09, 1966  Age: 56 y.o. MRN: [id number]  CC:  Chief Complaint  Patient presents with  . Hypertension    HPI Sharon [name] is here today for follow up.   Pt is with acute concerns.  Hypertension: more often than not finding her blood pressure is in her 120's and also 80's.  Does have palpitations at times however since stopping drinking her coke she has had decrease in palpitations. She does use truvia in her coffee as well, cut back her tea daily as well. Takes bystolic 5 mg daily   Elevated lft: very mild, alt 37.   Prediabetes: 8 months ago 5.6, now 5.7. does enjoy her soda however recently quit this.    Past Medical History:  Diagnosis Date  . Allergy   . Anal fissure   . Anemia    borderline  . Anxiety   . Arthritis   . Blood clot in vein    left leg  . Detached retina    BOTH SCLERA BUCKLE BOTH EYES  . DVT (deep venous thrombosis) (HCC)    left leg  . Endometriosis   . Family history of adverse reaction to anesthesia    DAUGHTER POST OP PONV  . GERD (gastroesophageal reflux disease)   . Glaucoma    RIGHT WORST THAN LEFT  . Heart murmur    "caused by anxiety"   . History of hemorrhoids   . History of kidney stones   . Hypertension   . IBS (irritable bowel syndrome)   . Perforated eardrum, right    SMALL HOLE  . PONV (postoperative nausea and vomiting)   . Pulmonary embolism ([place]) 6 YRS AGO  . Status post arthroscopy of left knee 08/06/2017    Past Surgical History:  Procedure Laterality Date  . ABDOMINAL HYSTERECTOMY     still with both ovaries  . CATARACT EXTRACTION Bilateral 2015  . COLONOSCOPY    . CYSTOSCOPY W/ URETERAL STENT PLACEMENT Right 05/23/2017   Procedure: CYSTOSCOPY WITH RETROGRADE PYELOGRAM/URETERAL RIGHT STENT PLACEMENT;  Surgeon: Bjorn Loser, MD;  Location: [facility];  Service: Urology;  Laterality: Right;  . CYSTOSCOPY W/  URETERAL STENT PLACEMENT Right 06/04/2017   Procedure: CYSTOSCOPY WITHRIGHT RETROGRADE PYELOGRAMRIGHT /URETEREOSCOPY  STENT PLACEMENT;  Surgeon: Lucas Mallow, MD;  Location: [facility];  Service: Urology;  Laterality: Right;  . ENDOMETRIAL ABLATION    . [place]  2011  . INCONTINENCE SURGERY    . INNER EAR SURGERY     X 2  . RECTAL PROLAPSE REPAIR    . RETINAL DETACHMENT SURGERY Bilateral 2000  . TOTAL KNEE ARTHROPLASTY Left 04/29/2019   Procedure: LEFT TOTAL KNEE ARTHROPLASTY;  Surgeon: Mcarthur Rossetti, MD;  Location: [facility];  Service: Orthopedics;  Laterality: Left;  . TUBAL LIGATION    . VEIN SURGERY Left 04/2010    Family History  Problem Relation Age of Onset  . Emphysema Mother        smoker  . Allergies Mother   . Asthma Mother   . Heart disease Mother        AMI as cause of death  . COPD Mother   . Asthma Father   . Heart disease Father        AMI as cause of death  . Diabetes Father   . Asthma Brother   . Heart disease Brother 38  heart failure  . Lung cancer Maternal Grandfather        was a smoker  . Cancer Maternal Grandfather        lung  . Heart disease Paternal Grandmother        AMI  . Heart disease Paternal Grandfather        AMI  . Colon cancer Neg Hx     Social History   Socioeconomic History  . Marital status: Divorced    Spouse name: Not on file  . Number of children: 1  . Years of education: Not on file  . Highest education level: Not on file  Occupational History  . Occupation: Restaurant manager, fast food: [employer]  Tobacco Use  . Smoking status: Never  . Smokeless tobacco: Never  Vaping Use  . Vaping Use: Never used  Substance and Sexual Activity  . Alcohol use: Not Currently    Comment: occ  . Drug use: No    Comment: former maryjuana use- quit in 1995  . Sexual activity: Yes    Partners: Male    Birth control/protection: Surgical  Other Topics Concern  . Not  on file  Social History Narrative   Marital status: divorced x 2; dating seriously x 3 years.        Children:  1 daughter (39); no grandchildren      Lives:  With boyfriend.        Employment: works at [employer]: none      Alcohol: rarely; socially      Exercise: none; has treadmill and elliptical and bikes   Coffee in am / 1/2 of coke in afternoon    High school education      01/29/20   From: the area   Living: alone - but boyfriend living with her Wilfred Lacy (2020) but knew each other in high school   Work: on disability - working on appeal      Family: one daughter [name] - one grandchild coming in January       Enjoys: relax and watch tv, use to like fishing/boating/rafting, reading, crochet       Exercise: rehab exercises   Diet: not great - limited intake      Safety   Seat belts: Yes    Guns: Yes  and secure   Right handed   One story home   Drinks caffeine   Social Determinants of Health   Financial Resource Strain: Not on file  Food Insecurity: Not on file  Transportation Needs: Not on file  Physical Activity: Not on file  Stress: Not on file  Social Connections: Not on file  Intimate Partner Violence: Not on file    Outpatient Medications Prior to Visit  Medication Sig Dispense Refill  . acetaminophen (TYLENOL) 500 MG tablet Take 1,000 mg by mouth every 8 (eight) hours as needed for moderate pain.     Marland Kitchen acyclovir (ZOVIRAX) 400 MG tablet Take 1 tablet by mouth daily.    Marland Kitchen albuterol (VENTOLIN HFA) 108 (90 Base) MCG/ACT inhaler INHALE 1-2 PUFFS BY MOUTH EVERY 6 HOURS AS NEEDED FOR WHEEZE OR SHORTNESS OF BREATH 8.5 each 2  . amitriptyline (ELAVIL) 10 MG tablet 1 tablet at bedtime Orally Once a day for 30 day(s)    . bimatoprost (LUMIGAN) 0.03 % ophthalmic solution Place 1 drop into both eyes at bedtime.    . Cholecalciferol (VITAMIN D) 50 MCG (2000 UT)  tablet 1 tablet    . clobetasol (TEMOVATE) 0.05 % external solution Apply 1 Application topically  2 (two) times daily. 50 mL 0  . clonazePAM (KLONOPIN) 0.5 MG tablet Take 1 tablet (0.5 mg total) by mouth at bedtime. 30 tablet 5  . diclofenac Sodium (VOLTAREN) 1 % GEL     . EPINEPHrine 0.3 mg/0.3 mL IJ SOAJ injection Inject 0.3 mg into the muscle as needed for anaphylaxis. 1 each 0  . gabapentin (NEURONTIN) 600 MG tablet Take 1 tablet by mouth 3 (three) times daily.    Marland Kitchen lidocaine (LIDODERM) 5 % Apply 1 patch topically daily.    Marland Kitchen LUMIGAN 0.01 % SOLN 1 drop at bedtime.    . meloxicam (MOBIC) 15 MG tablet Take 1 tablet by mouth daily.    . methocarbamol (ROBAXIN) 500 MG tablet Take 1 tablet by mouth 4 (four) times daily.    . Multiple Vitamin (MULTI VITAMIN) TABS 1 tablet Orally Once a day for 30 day(s)    . nebivolol (BYSTOLIC) 5 MG tablet TAKE 1 TABLET BY MOUTH EVERY DAY 90 tablet 3  . Nitroglycerin (RECTIV) 0.4 % OINT Place 0.4 inches rectally 2 (two) times daily as needed (For anal fissures.). 30 g 1  . ondansetron (ZOFRAN-ODT) 4 MG disintegrating tablet 1 tablet on the tongue and allow to dissolve Orally Once a day for 30 day(s)    . pantoprazole (PROTONIX) 40 MG tablet TAKE 1 TABLET BY MOUTH EVERY DAY 90 tablet 1  . polyethylene glycol powder (GLYCOLAX/MIRALAX) 17 GM/SCOOP powder See admin instructions.    . primidone (MYSOLINE) 50 MG tablet 1 tablet Orally Once a day for 30 day(s)    . Travoprost, BAK Free, (TRAVATAN) 0.004 % SOLN ophthalmic solution 1 drop into affected eye in the evening Ophthalmic Once a day    . vitamin B-12 (CYANOCOBALAMIN) 1000 MCG tablet Take 1 tablet (1,000 mcg total) by mouth daily. 30 tablet 3  . Zinc 50 MG TABS 1 tablet Orally Once a day for 30 day(s)     No facility-administered medications prior to visit.    Allergies  Allergen Reactions  . Bee Venom Anaphylaxis and Other (See Comments)  . Penicillins Other (See Comments) and Swelling    Reaction unknown from childhood Has patient had a PCN reaction causing immediate rash, facial/tongue/throat  swelling, SOB or lightheadedness with hypotension: unknown Has patient had a PCN reaction causing severe rash involving mucus membranes or skin necrosis: unknown  Has patient had a PCN reaction that required hospitalization: no Has patient had a PCN reaction occurring within the last 10 years: no If all of the above answers are "NO", then may proceed with Cephalosporin use.  Other reaction(s): Other, Unknown Throat swells Throat swells Reaction unknown from childhood Has patient had a PCN reaction causing immediate rash, facial/tongue/throat swelling, SOB or lightheadedness with hypotension: unknown Has patient had a PCN reaction causing severe rash involving mucus membranes or skin necrosis: unknown  Has patient had a PCN reaction that required hospitalization: no Has patient had a PCN reaction occurring within the last 10 years: no If all of the above answers are "NO", then may proceed with Cephalosporin use.  Other reaction(s): Not available  . Dextromethorphan     Other reaction(s): Lump in throat Other reaction(s): Lump in throat  . Penicillin G     Other reaction(s): Unknown  . Dextromethorphan Polistirex Er Other (See Comments)    Pt states caused "a lump" in her throat, making it difficult to  swallow  . Fluconazole     Primidone interaction Other reaction(s): Primidone interaction Other reaction(s): Primidone interaction  . Hydrocodone Nausea And Vomiting and Nausea Only    Other reaction(s): nausea and vomiting Other reaction(s): nausea and vomiting  . Oxycodone-Acetaminophen Other (See Comments)    Made fingers tingle and go numb, started "feeling weird".  Other reaction(s): Other Made fingers tingle and go numb, started "feeling weird".  Other reaction(s): tingle/numb fingers Other reaction(s): tingle/numb fingers  . Tape Itching and Other (See Comments)    Bandaids - leaves red marks Other reaction(s): itching Other reaction(s): itching         Objective:     Physical Exam Vitals reviewed.  Constitutional:      General: She is not in acute distress.    Appearance: Normal appearance. She is normal weight. She is not ill-appearing, toxic-appearing or diaphoretic.  Cardiovascular:     Rate and Rhythm: Regular rhythm. Bradycardia present.     Heart sounds: No murmur heard. Pulmonary:     Effort: Pulmonary effort is normal.     Breath sounds: Normal breath sounds.  Neurological:     General: No focal deficit present.     Mental Status: She is alert and oriented to person, place, and time. Mental status is at baseline.  Psychiatric:        Mood and Affect: Mood normal.        Behavior: Behavior normal.        Thought Content: Thought content normal.        Judgment: Judgment normal.      BP 128/80   Pulse 76   Temp 97.9 F (36.6 C)   Resp 16   Ht 5' 7.5" (1.715 m)   Wt 170 lb 2 oz (77.2 kg)   LMP 10/11/2010   SpO2 97%   BMI 26.25 kg/m  Wt Readings from Last 3 Encounters:  04/11/22 170 lb 2 oz (77.2 kg)  03/28/22 172 lb (78 kg)  03/28/22 172 lb (78 kg)     Health Maintenance Due  Topic Date Due  . Zoster Vaccines- Shingrix (1 of 2) Never done  . PAP SMEAR-Modifier  01/03/2018  . COVID-19 Vaccine (4 - Moderna risk series) 10/01/2020  . INFLUENZA VACCINE  01/17/2022    There are no preventive care reminders to display for this patient.  Lab Results  Component Value Date   TSH 1.80 03/28/2022   Lab Results  Component Value Date   WBC 6.9 03/28/2022   HGB 13.3 03/28/2022   HCT 40.0 03/28/2022   MCV 88.5 03/28/2022   PLT 285.0 03/28/2022   Lab Results  Component Value Date   NA 140 03/28/2022   K 4.4 03/28/2022   CO2 31 03/28/2022   GLUCOSE 75 03/28/2022   BUN 11 03/28/2022   CREATININE 0.75 03/28/2022   BILITOT 0.5 03/28/2022   ALKPHOS 86 03/28/2022   AST 23 03/28/2022   ALT 37 (H) 03/28/2022   PROT 7.1 03/28/2022   ALBUMIN 4.4 03/28/2022   CALCIUM 9.8 03/28/2022   ANIONGAP 10 11/08/2019   GFR 88.28  03/28/2022   Lab Results  Component Value Date   CHOL 169 04/13/2021   Lab Results  Component Value Date   HDL 54.50 04/13/2021   Lab Results  Component Value Date   LDLCALC 92 04/13/2021   Lab Results  Component Value Date   TRIG 113.0 04/13/2021   Lab Results  Component Value Date   CHOLHDL 3 04/13/2021  Lab Results  Component Value Date   HGBA1C 5.7 (H) 03/28/2022      Assessment & Plan:   Problem List Items Addressed This Visit       Cardiovascular and Mediastinum   Hypertension - Primary    Blood pressure acceptable range. Continue bystolic 5 mg      Relevant Orders   Ambulatory referral to Cardiology     Other   Bradycardia on ECG   Palpitations    NSR in office, refer to cardiologist, possible Holter monitor needed for further eval/ Continue decreasing caffeine intake, increase water intake to goal 64 oz daily.  Bradycardia on EKG, expected as pt on BB      Relevant Orders   EKG 12-Lead (Completed)   Ambulatory referral to Cardiology   Prediabetes    Pt advised of the following: Work on a diabetic diet, try to incorporate exercise at least 20-30 a day for 3 days a week or more.        Other Visit Diagnoses     Elevated LFTs       Relevant Orders   Hepatic Function Panel       No orders of the defined types were placed in this encounter.   Follow-up: Return in about 6 months (around 10/11/2022) for f/u blood pressure.    Eugenia Pancoast, FNP

## 2022-04-11 NOTE — Patient Instructions (Addendum)
A referral was placed today for cardiology.  Please let us know if you have not heard back within 2 weeks about the referral.  Make lab only appt in one month just to repeat liver function tests.    Regards,   Eugenia Pancoast FNP-C

## 2022-04-13 DIAGNOSIS — R7303 Prediabetes: Secondary | ICD-10-CM | POA: Insufficient documentation

## 2022-04-13 NOTE — Assessment & Plan Note (Signed)
Pt advised of the following: Work on a diabetic diet, try to incorporate exercise at least 20-30 a day for 3 days a week or more.   

## 2022-04-13 NOTE — Assessment & Plan Note (Signed)
NSR in office, refer to cardiologist, possible Holter monitor needed for further eval/ Continue decreasing caffeine intake, increase water intake to goal 64 oz daily.  Bradycardia on EKG, expected as pt on BB

## 2022-04-13 NOTE — Assessment & Plan Note (Signed)
Blood pressure acceptable range. Continue bystolic 5 mg

## 2022-04-28 ENCOUNTER — Ambulatory Visit: Payer: PRIVATE HEALTH INSURANCE | Admitting: Internal Medicine

## 2022-05-07 ENCOUNTER — Other Ambulatory Visit: Payer: Self-pay | Admitting: Primary Care

## 2022-05-08 NOTE — Progress Notes (Unsigned)
NEW PATIENT Date of Service/Encounter:  05/10/22 Referring provider: Eugenia Pancoast, FNP Primary care provider: Eugenia Pancoast, FNP  Subjective:  Sharon [name] is a 56 y.o. female with a PMHx of scalp psoriasis, IBS, reactive airway disease, reflux, hypertension, prediabetes, restless leg, anemia presenting today for evaluation of "allergic reaction". History obtained from: chart review and patient.   New problem of sore throat, and voice strain. She has constant sore throat. Her voice squeals and stops, unable to sting now. Voice deeper. She is taking medication for acid reflux-she failed famotidine, now on pantoprazole 40 mg in the morning.  It helps during the day, but she still has symptoms at night.   She feels like she is having allergic reactions when outside. She will get immediate onset of shortness of breath and can't breath.  She will have sensation of throat closing from certain smells trigger her.  Happened when she was mowing.  Sometimes drinks cold water which helps.  She does not regularly take allergy medicines because they dry her out.  She also feels like pantoprazole dries her out when she takes more than 1 a day.  She did eat a salsa over the summer-unsure if cilantro, cumin, hot peppers, but she had trouble breathing.  She felt like her throat completely closed off. She gave herself epinephrine which helped.  Another time she was driving down the road and drank strawberry flavored water, and had the same sensation that her throat was closing up. Peanut butter makes her feel sick which started since switching to a new medication within the last 2 years. Makes her nauseous.   She has not been seen by ENT, but has been referred. She has an upcoming visit with GI next year.  She does not see GI for her reflux. Appt scheduled for February 6th.  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  She has had significant swelling from bee stings, and was given an  epipen.  Once she got stuck multiple times and felt sick with severe nausea.  When she sleeps at night, she wakes up feeling like she can't breath.  This happens a lot if she is sitting up. Her head position makes a difference.  She uses albuterol prescribed by primary.  She uses this 2-3 times per week.  She doesn't find it helpful.  Her family does have asthma. She does have significant snoring and wakes up from not being able to breath. She has had a sleep study once that was normal.  She has been around smokers her whole life, no first hand smoke exposure.  Patient evaluated by PCP on 03/07/2022 for "allergic reaction".  Having sensation of chest heaviness when lying down to go to sleep at night, acute episode while drinking strawberry flavored water.  Episode of throat closing sensation after eating salsa and difficulty breathing self treated with epinephrine.  Referred to allergy, and started on beta-blocker.  Has history of asthma. Followed by [facility].  PFTs 03/31/2022 with pulmonary were within normal limits without significant response to bronchodilator. Chest CT angio 01/17/22: 1. Negative for acute pulmonary embolism. 2. Pulmonary parenchymal findings compatible with mild bronchiolitis. Recommend correlation with history of smoking 2022 CXR normal Pending sleep study  Other allergy screening: Medication allergy: penicillin allergy-unsure reaction, was told to avoid by her mother in childhood   Past Medical History: Past Medical History:  Diagnosis Date   Allergy    Anal fissure    Anemia  borderline   Anxiety    Arthritis    Blood clot in vein    left leg   Detached retina    BOTH SCLERA BUCKLE BOTH EYES   DVT (deep venous thrombosis) (HCC)    left leg   Endometriosis    Family history of adverse reaction to anesthesia    DAUGHTER POST OP PONV   GERD (gastroesophageal reflux disease)    Glaucoma    RIGHT WORST THAN LEFT   Heart murmur    "caused by  anxiety"    History of hemorrhoids    History of kidney stones    Hypertension    IBS (irritable bowel syndrome)    Perforated eardrum, right    SMALL HOLE   PONV (postoperative nausea and vomiting)    Pulmonary embolism (HCC) 6 YRS AGO   Status post arthroscopy of left knee 08/06/2017   Medication List:  Current Outpatient Medications  Medication Sig Dispense Refill   acetaminophen (TYLENOL) 500 MG tablet Take 1,000 mg by mouth every 8 (eight) hours as needed for moderate pain.      acyclovir (ZOVIRAX) 400 MG tablet Take 1 tablet by mouth daily.     albuterol (VENTOLIN HFA) 108 (90 Base) MCG/ACT inhaler INHALE 1-2 PUFFS BY MOUTH EVERY 6 HOURS AS NEEDED FOR WHEEZE OR SHORTNESS OF BREATH 8.5 each 2   amitriptyline (ELAVIL) 10 MG tablet 1 tablet at bedtime Orally Once a day for 30 day(s)     bimatoprost (LUMIGAN) 0.03 % ophthalmic solution Place 1 drop into both eyes at bedtime.     Cholecalciferol (VITAMIN D) 50 MCG (2000 UT) tablet 1 tablet     clobetasol (TEMOVATE) 0.05 % external solution Apply 1 Application topically 2 (two) times daily. 50 mL 0   clonazePAM (KLONOPIN) 0.5 MG tablet Take 1 tablet (0.5 mg total) by mouth at bedtime. 30 tablet 5   diclofenac Sodium (VOLTAREN) 1 % GEL      EPINEPHrine 0.3 mg/0.3 mL IJ SOAJ injection Inject 0.3 mg into the muscle as needed for anaphylaxis. 1 each 0   famotidine (PEPCID) 40 MG tablet TAKE 1 TABLET BY MOUTH EVERY DAY 90 tablet 1   gabapentin (NEURONTIN) 600 MG tablet Take 1 tablet by mouth 3 (three) times daily.     lidocaine (LIDODERM) 5 % Apply 1 patch topically daily.     LUMIGAN 0.01 % SOLN 1 drop at bedtime.     meloxicam (MOBIC) 15 MG tablet Take 1 tablet by mouth daily.     methocarbamol (ROBAXIN) 500 MG tablet Take 1 tablet by mouth 4 (four) times daily.     nebivolol (BYSTOLIC) 5 MG tablet TAKE 1 TABLET BY MOUTH EVERY DAY 90 tablet 3   Nitroglycerin (RECTIV) 0.4 % OINT Place 0.4 inches rectally 2 (two) times daily as needed (For  anal fissures.). 30 g 1   ondansetron (ZOFRAN-ODT) 4 MG disintegrating tablet 1 tablet on the tongue and allow to dissolve Orally Once a day for 30 day(s)     pantoprazole (PROTONIX) 40 MG tablet TAKE 1 TABLET BY MOUTH EVERY DAY 90 tablet 1   polyethylene glycol powder (GLYCOLAX/MIRALAX) 17 GM/SCOOP powder See admin instructions.     primidone (MYSOLINE) 50 MG tablet 1 tablet Orally Once a day for 30 day(s)     vitamin B-12 (CYANOCOBALAMIN) 1000 MCG tablet Take 1 tablet (1,000 mcg total) by mouth daily. 30 tablet 3   Zinc 50 MG TABS 1 tablet Orally Once a day for 30  day(s)     No current facility-administered medications for this visit.   Known Allergies:  Allergies  Allergen Reactions   Bee Venom Anaphylaxis and Other (See Comments)   Penicillins Other (See Comments) and Swelling    Reaction unknown from childhood Has patient had a PCN reaction causing immediate rash, facial/tongue/throat swelling, SOB or lightheadedness with hypotension: unknown Has patient had a PCN reaction causing severe rash involving mucus membranes or skin necrosis: unknown  Has patient had a PCN reaction that required hospitalization: no Has patient had a PCN reaction occurring within the last 10 years: no If all of the above answers are "NO", then may proceed with Cephalosporin use.  Other reaction(s): Other, Unknown Throat swells Throat swells Reaction unknown from childhood Has patient had a PCN reaction causing immediate rash, facial/tongue/throat swelling, SOB or lightheadedness with hypotension: unknown Has patient had a PCN reaction causing severe rash involving mucus membranes or skin necrosis: unknown  Has patient had a PCN reaction that required hospitalization: no Has patient had a PCN reaction occurring within the last 10 years: no If all of the above answers are "NO", then may proceed with Cephalosporin use.  Other reaction(s): Not available   Dextromethorphan     Other reaction(s): Lump in  throat Other reaction(s): Lump in throat   Penicillin G     Other reaction(s): Unknown   Dextromethorphan Polistirex Er Other (See Comments)    Pt states caused "a lump" in her throat, making it difficult to swallow   Fluconazole     Primidone interaction Other reaction(s): Primidone interaction Other reaction(s): Primidone interaction   Hydrocodone Nausea And Vomiting and Nausea Only    Other reaction(s): nausea and vomiting Other reaction(s): nausea and vomiting   Oxycodone-Acetaminophen Other (See Comments)    Made fingers tingle and go numb, started "feeling weird".  Other reaction(s): Other Made fingers tingle and go numb, started "feeling weird".  Other reaction(s): tingle/numb fingers Other reaction(s): tingle/numb fingers   Tape Itching and Other (See Comments)    Bandaids - leaves red marks Other reaction(s): itching Other reaction(s): itching   Past Surgical History: Past Surgical History:  Procedure Laterality Date   ABDOMINAL HYSTERECTOMY     still with both ovaries   CATARACT EXTRACTION Bilateral 2015   COLONOSCOPY     CYSTOSCOPY W/ URETERAL STENT PLACEMENT Right 05/23/2017   Procedure: CYSTOSCOPY WITH RETROGRADE PYELOGRAM/URETERAL RIGHT STENT PLACEMENT;  Surgeon: Bjorn Loser, MD;  Location: [facility];  Service: Urology;  Laterality: Right;   CYSTOSCOPY W/ URETERAL STENT PLACEMENT Right 06/04/2017   Procedure: CYSTOSCOPY WITHRIGHT RETROGRADE PYELOGRAMRIGHT /URETEREOSCOPY  STENT PLACEMENT;  Surgeon: Lucas Mallow, MD;  Location: [facility];  Service: Urology;  Laterality: Right;   ENDOMETRIAL ABLATION     HEMORRHOID SURGERY  2011   INCONTINENCE SURGERY     INNER EAR SURGERY     X 2   RECTAL PROLAPSE REPAIR     RETINAL DETACHMENT SURGERY Bilateral 2000   TOTAL KNEE ARTHROPLASTY Left 04/29/2019   Procedure: LEFT TOTAL KNEE ARTHROPLASTY;  Surgeon: Mcarthur Rossetti, MD;  Location: [facility];  Service: Orthopedics;  Laterality: Left;    TUBAL LIGATION     VEIN SURGERY Left 04/2010   Family History: Family History  Problem Relation Age of Onset   Emphysema Mother        smoker   Allergies Mother    Asthma Mother    Heart disease Mother        AMI  as cause of death   COPD Mother    Asthma Father    Heart disease Father        AMI as cause of death   Diabetes Father    Asthma Brother    Heart disease Brother 46       heart failure   Lung cancer Maternal Grandfather        was a smoker   Cancer Maternal Grandfather        lung   Heart disease Paternal Grandmother        AMI   Heart disease Paternal Grandfather        AMI   Colon cancer Neg Hx    Social History: [place]ia lives in a house built in [place], [employer], gas and electric heating, central AC, her cat died last week which was outdoors.  She has no indoor pets since May 2023.  No cockroaches.  Using dust mite protection on the bedding and pillows.  She does not personally smoke but has been around smoke her entire life.  Her father was a tobacco former.  [place]  She is no longer working since having a knee replacement.   ROS:  All other systems negative except as noted per HPI.  Objective:  Blood pressure 118/80, pulse 88, temperature 99.4 F (37.4 C), temperature source Temporal, resp. rate 18, height '5\' 7"'$  (1.702 m), weight 170 lb (77.1 kg), last menstrual period 10/11/2010, SpO2 98 %. Body mass index is 26.63 kg/m. Physical Exam:  General Appearance:  Alert, cooperative, no distress, appears stated age  Head:  Normocephalic, without obvious abnormality, atraumatic  Eyes:  Conjunctiva clear, EOM's intact  Nose: Nares normal, hypertrophic turbinates, normal mucosa, and no visible anterior polyps  Throat: Lips, tongue normal; teeth and gums normal, normal posterior oropharynx  Neck: Supple, symmetrical  Lungs:   clear to auscultation bilaterally, Respirations unlabored, no coughing  Heart:  regular rate  and rhythm and no murmur, Appears well perfused  Extremities: No edema  Skin: Skin color, texture, turgor normal, no rashes or lesions on visualized portions of skin  Neurologic: No gross deficits     Diagnostics: Spirometry:  Tracings reviewed. Her effort: Good reproducible efforts. FVC: 3.25L  FEV1: 2.66L, 91% predicted  FEV1/FVC ratio: 0.82 Interpretation: Spirometry consistent with normal pattern   Skin Testing: Environmental allergy panel and select foods.  Adequate controls. Results discussed with patient/family.  Airborne Adult Perc - 05/10/22 1437     Time Antigen Placed 1440    Allergen Manufacturer Lavella Hammock    Location Back    Number of Test 59    1. Control-Buffer 50% Glycerol Negative    2. Control-Histamine 1 mg/ml 3+    3. Albumin saline Negative    4. C[place]ton Negative    5. [place] Negative    6. Johnson Negative    7. Wilbarger Blue Negative    8. Meadow Fescue Negative    9. Perennial Rye Negative    10. Sweet Vernal Negative    11. Timothy Negative    12. Cocklebur Negative    13. Burweed Marshelder Negative    14. Ragweed, short Negative    15. Ragweed, Giant Negative    16. Plantain,  English Negative    17. Lamb's Quarters Negative    18. Sheep Sorrell Negative    19. Rough Pigweed Negative    20. Marsh Elder, Rough Negative    21. Mugwort, Common Negative  22. Ash mix Negative    23. Birch mix Negative    24. Beech American Negative    25. Box, Elder Negative    26. Cedar, red Negative    27. Cottonwood, [place] Negative    28. Elm mix Negative    29. Hickory Negative    30. Maple mix Negative    31. Oak, [place] mix Negative    32. Pecan Pollen Negative    33. Pine mix Negative    34. Sycamore Eastern Negative    35. [place], Black Pollen Negative    36. Alternaria alternata Negative    37. Cladosporium Herbarum Negative    38. Aspergillus mix Negative    39. Penicillium mix Negative    40. Bipolaris sorokiniana (Helminthosporium)  Negative    41. Drechslera spicifera (Curvularia) Negative    42. Mucor plumbeus Negative    43. Fusarium moniliforme Negative    44. Aureobasidium pullulans (pullulara) Negative    45. Rhizopus oryzae Negative    46. Botrytis cinera Negative    47. Epicoccum nigrum Negative    48. Phoma betae Negative    49. Candida Albicans Negative    50. Trichophyton mentagrophytes Negative    51. Mite, D Farinae  5,000 AU/ml Negative    52. Mite, D Pteronyssinus  5,000 AU/ml Negative    53. Cat Hair 10,000 BAU/ml Negative    54.  Dog Epithelia Negative    55. Mixed Feathers Negative    56. Horse Epithelia Negative    57. Cockroach, German Negative    58. Mouse Negative    59. Tobacco Leaf Negative             Food Perc - 05/10/22 1437       Test Information   Time Antigen Placed 1440    Allergen Manufacturer Lavella Hammock    Location Back    Number of allergen test 10      Food   1. Peanut Negative    2. Soybean food Negative    3. Wheat, whole Negative    4. Sesame Negative    5. Milk, cow Negative    6. Egg White, chicken Negative    7. Casein Negative    8. Shellfish mix Negative    9. Fish mix Negative    10. Cashew Negative             Intradermal - 05/10/22 1514     Time Antigen Placed 1515    Allergen Manufacturer Lavella Hammock    Location Arm    Number of Test 15    Control Negative    Guatemala Negative    Johnson Negative    7 Grass Negative    Ragweed mix Negative    Weed mix Negative    Tree mix Negative    Mold 1 2+    Mold 2 3+    Mold 3 Negative    Mold 4 Negative    Cat Negative    Dog Negative    Cockroach Negative    Mite mix Negative             Food Adult Perc - 05/10/22 1400     Time Antigen Placed 1440    Allergen Manufacturer Greer    Location Back    Number of allergen test 1    60. Strawberry Negative             Allergy testing results were read and interpreted by myself, documented by  clinical staff.  Assessment and Plan   Sore throat, voice strain:  -- environmental allergy testing today: skin testing negative, intradermals positive to indoor/outdoor molds - start nasacort 2 sprays in each nostril daily-can buy over the counter; sample given --follow-up with ENT as scheduled  Recurrent episodes of acute onset throat tightening:  Suspect related to laryngospasm/VCD (see below) -- food allergy testing today to strawberry and top 9 most common food allergens: negative -- labs today for cumin, cilantro, chili pepper; avoid until testing returns --continue to carry epipen to use in case airway compromised  Uncontrolled reflux:  --increase pantoprazole to 40 mg twice daily; take 30 minutes prior to meals --follow-up with GI as scheduled  Acute episodes of shortness of breath:  --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): (wheezing/shortness of breath arising from the upper chest/throat, lack of wheezing, symptoms not worse at night, sudden onset with rapid recovery, inconsistent/absent/immediate response to albutero - uncontrolled reflux and post-nasal drainage can make VCD worse and can even be the sole cause of symptoms - protonix as above - try VCD exercises (see information packet provided) first for breathing symptoms - for symptoms not improved with breathing exercises, ok to use Albuterol  Snoring and nighttime awakenings:  --follow-up with Pulmonary sleep as scheduled, repeat sleep study as planned  Concern For Stinging Insect Allergy: - based on today's history, will obtain labs for stinging insects - if negative, would consider confirming with skin testing - please carry Epinephrine autoinjector at all times in case of accidental sting, to be used if stung and has SKIN AND ANY OTHER SYMPTOMS - for SKIN only, can take Benadryl 2 tsp (25 mg)  - recommend medical alert bracelet - practice avoidance measures as outlined below when possible  H/O Penicillin allergy: - please schedule  follow-up appt at your convenience for penicillin testing followed by graded oral challenge if indicated - please refrain from taking any antihistamines at least 3 days prior to this appointment  - around 80% of individuals outgrow this allergy in ~ 10 years and carrying it as a diagnosis can prevent you from getting proper therapy if needed    Follow up : 3 months, sooner if needed  This note in its entirety was forwarded to the Provider who requested this consultation.  Thank you for your kind referral. I appreciate the opportunity to take part in [facility] care. Please do not hesitate to contact me with questions.  Sincerely,  Sigurd Sos, MD [facility] [place] [facility] [place]

## 2022-05-10 ENCOUNTER — Encounter: Payer: Self-pay | Admitting: Internal Medicine

## 2022-05-10 ENCOUNTER — Ambulatory Visit (INDEPENDENT_AMBULATORY_CARE_PROVIDER_SITE_OTHER): Payer: No Typology Code available for payment source | Admitting: Internal Medicine

## 2022-05-10 VITALS — BP 118/80 | HR 88 | Temp 99.4°F | Resp 18 | Ht 67.0 in | Wt 170.0 lb

## 2022-05-10 DIAGNOSIS — R0989 Other specified symptoms and signs involving the circulatory and respiratory systems: Secondary | ICD-10-CM

## 2022-05-10 DIAGNOSIS — R0602 Shortness of breath: Secondary | ICD-10-CM

## 2022-05-10 DIAGNOSIS — J3089 Other allergic rhinitis: Secondary | ICD-10-CM | POA: Diagnosis not present

## 2022-05-10 DIAGNOSIS — T781XXA Other adverse food reactions, not elsewhere classified, initial encounter: Secondary | ICD-10-CM

## 2022-05-10 DIAGNOSIS — Z91030 Bee allergy status: Secondary | ICD-10-CM

## 2022-05-10 DIAGNOSIS — K219 Gastro-esophageal reflux disease without esophagitis: Secondary | ICD-10-CM

## 2022-05-10 DIAGNOSIS — J310 Chronic rhinitis: Secondary | ICD-10-CM

## 2022-05-10 DIAGNOSIS — R0683 Snoring: Secondary | ICD-10-CM

## 2022-05-10 MED ORDER — PANTOPRAZOLE SODIUM 40 MG PO TBEC: 40.0000 mg | DELAYED_RELEASE_TABLET | Freq: Two times a day (BID) | ORAL | 1 refills | Status: DC

## 2022-05-10 MED ORDER — MOMETASONE FUROATE 50 MCG/ACT NA SUSP: 2.0000 | Freq: Every day | NASAL | 12 refills | Status: AC

## 2022-05-10 NOTE — Patient Instructions (Addendum)
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 (see below) -- food allergy testing today to strawberry and top 9 most common food allergens: negative -- labs today for cumin, cilantro, chili pepper --continue to carry epipen to use in case airway compromised  Uncontrolled reflux:  --increase pantoprazole to 40 mg twice daily; take 30 minutes prior to meals --follow-up with GI as scheduled  Acute episodes of shortness of breath:  --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): (wheezing/shortness of breath arising from the upper chest/throat, lack of wheezing, symptoms not worse at night, sudden onset with rapid recovery, inconsistent/absent/immediate response to albutero - uncontrolled reflux and post-nasal drainage can make VCD worse and can even be the sole cause of symptoms - protonix as above - try VCD exercises (see information packet provided) first for breathing symptoms - for symptoms not improved with breathing exercises, ok to use Albuterol  Snoring and nighttime awakenings:  --follow-up with Pulmonary sleep as scheduled, repeat sleep study as planned  Concern For Stinging Insect Allergy: - based on today's history, will obtain labs for stinging insects - if negative, would consider confirming with skin testing - please carry Epinephrine autoinjector at all times in case of accidental sting, to be used if stung and has SKIN AND ANY OTHER SYMPTOMS - for SKIN only, can take Benadryl 2 tsp (25 mg)  - recommend medical alert bracelet - practice avoidance measures as outlined below when possible   H/O Penicillin allergy: - please schedule follow-up appt at your convenience for  penicillin testing followed by graded oral challenge if indicated - please refrain from taking any antihistamines at least 3 days prior to this appointment  - around 80% of individuals outgrow this allergy in ~ 10 years and carrying it as a diagnosis can prevent you from getting proper therapy if needed   Follow up : 3 months, sooner if needed It was a pleasure meeting you in clinic today! Thank you for allowing me to participate in your care.  Sigurd Sos, MD [facility] ----------------------------------------------------------------------------------------------------  Vocal Cord Dysfunction (VCD) /Inspiratory Laryngeal Obstruction (ILO)/ Exercise- Induced Laryngeal Obstruction Exercises (EILO)  Practice these techniques several times a day, so that when you need them, they will be automatic, and will work right away (or very fast) to open up the spasming (closed) vocal cords.  PREPARATION: ? Loosen clothing at waist, so nothing is tight or snug. Open top buttons of pants or skirt, unzip pant or skirt zippers, pull shirt out over pants or skirt. This prevents pressure on the stomach, which can cause stomach reflux (backup of corrosive liquid) into the esophagus. Gastric reflux is a frequent cause of VCD attacks. ? Sit in a comfortable chair. When you need to use these techniques, you may be standing, lying, or sitting-BUT first try to learn them while sitting because they are easier to learn in this position. ? Keep water handy. Take sips, so your mouth and throat will not dry out. Swallow carefully, to avoid choking. ? Put your right (or left) thumb on your belly button, with the rest of your fingers below the thumb, so you are GENTLY touching your belly (lower abdomen). ? Doing this will focus your attention on your lower abdominal muscles. ? Relax  your chest. Relax your shoulders. Relax your neck. Relax your throat. This helps you to try to use ONLY your  belly (lower abdomen) muscles. Consciously try NOT to use chest muscles, etc. Chest muscle use seems to irritate vocal cords while "belly" muscles tend to relax them.  BREATHE OUT (EXHALE) FIRST WITH slightly PURSED LIPS: ? Since most people cannot inhale, (breathe in), during VCD attacks, please start by breathing out (exhaling) in the special way described below, and this will usually open up the vocal cords, immediately. ? Start, by breathing out (exhaling) with lips "pursed" as if you are trying to gently blow out a candle. ? This is similar to whistling, but your lips will not be as "puckered' as when whistling and your lips will not be pushed forward, like when whistling. If you look in a mirror, you would see a mostly horizontal line of space between the lips, while you exhale the 'ffffffffffffffffffff'. ? This may be silent, or may sound like a gentle wind or breeze, like 'fffffffffffffff' and your lips should be symmetrical, around your teeth. You lower lip will not be touching your upper teeth, like when someone says the name [id number]. This is one continuous flow of exhaled air, either silent, or making a slightly windy sound. ? Feel your hand on your belly (lower abdomen) come IN, a little bit toward your back, as you are 'working' only your lower belly muscles. ? This abdominal exhaled 'fffffffffffffffff' alone often stops VCD attacks very quickly.  ? Some prefer to try a variation of exhaling 'ffffffffffff'. Try exhaling quickly, 'ffff', 'fffff', 'ffff'--all part of one exhalation. Do not inhale in between quick "fff's. This helps some people more quickly open up the spasming vocal cords. This is like blowing out a slightly stubborn candle, exhaling against a tiny bit of resistance (like trying to blow out a trick birthday candle). ? If any of this helps, try to slow down the exhalations, and gently exhale 'ffffffffffffffffff'. ? Some people prefer to exhale gently  'ssssssssssssssss' or 'shhhhhhhhhhhhh' rather than 'fffffffffffffff', etc. Choose what works best in your case, or what is most comfortable for you. ? You may need to repeat exhaling 'ffffffffffffff' (or 'ffff', 'ffff', 'ffff'), etc, several times to relax the spasming vocal cords. Repeating this often stops a VCD attack so that you can inhale (breathe in) again.  Control of Mold Allergen   Mold and fungi can grow on a variety of surfaces provided certain temperature and moisture conditions exist.  Outdoor molds grow on plants, decaying vegetation and soil.  The major outdoor mold, Alternaria and Cladosporium, are found in very high numbers during hot and dry conditions.  Generally, a late Summer - Fall peak is seen for common outdoor fungal spores.  Rain will temporarily lower outdoor mold spore count, but counts rise rapidly when the rainy period ends.  The most important indoor molds are Aspergillus and Penicillium.  Dark, humid and poorly ventilated basements are ideal sites for mold growth.  The next most common sites of mold growth are the bathroom and the kitchen.  Outdoor (Seasonal) Mold Control  Use air conditioning and keep windows closed Avoid exposure to decaying vegetation. Avoid leaf raking. Avoid grain handling. Consider wearing a face mask if working in moldy areas.    Indoor (Perennial) Mold Control   Maintain humidity below 50%. Clean washable surfaces with 5% bleach solution. Remove sources e.g. contaminated carpets.

## 2022-05-14 LAB — ALLERGEN, CUMIN SEED: F265-IgE Cumin: <0.1 kU/L

## 2022-05-14 LAB — TRYPTASE: Tryptase: 3.6 ug/L (ref 2.2–13.2)

## 2022-05-14 LAB — ALLERGEN HYMENOPTERA PANEL
Bumblebee: <0.1 kU/L
Honeybee IgE: <0.1 kU/L
Hornet, White Face, IgE: 0.25 kU/L — AB
Hornet, Yellow, IgE: 0.11 kU/L — AB
Paper Wasp IgE: <0.1 kU/L
Yellow Jacket, IgE: 0.25 kU/L — AB

## 2022-05-14 LAB — ALLERGEN,CHILI PEPPER,RF279: F279-IgE Chili Pepper: <0.1 kU/L

## 2022-05-14 LAB — ALLERGEN, CORIANDER/CILANTRO IGE: F317-IgE Coriander/Cilantro: <0.1 kU/L

## 2022-05-16 ENCOUNTER — Other Ambulatory Visit: Payer: PRIVATE HEALTH INSURANCE

## 2022-05-23 ENCOUNTER — Other Ambulatory Visit (INDEPENDENT_AMBULATORY_CARE_PROVIDER_SITE_OTHER): Payer: Medicare Other

## 2022-05-23 DIAGNOSIS — R7989 Other specified abnormal findings of blood chemistry: Secondary | ICD-10-CM

## 2022-05-23 LAB — HEPATIC FUNCTION PANEL
ALT: 55 U/L — ABNORMAL HIGH (ref 0–35)
AST: 27 U/L (ref 0–37)
Albumin: 4.5 g/dL (ref 3.5–5.2)
Alkaline Phosphatase: 85 U/L (ref 39–117)
Bilirubin, Direct: 0.1 mg/dL (ref 0.0–0.3)
Total Bilirubin: 0.4 mg/dL (ref 0.2–1.2)
Total Protein: 7 g/dL (ref 6.0–8.3)

## 2022-05-24 ENCOUNTER — Other Ambulatory Visit: Payer: Self-pay | Admitting: Family

## 2022-05-24 ENCOUNTER — Encounter: Payer: Self-pay | Admitting: Family

## 2022-05-24 ENCOUNTER — Encounter: Payer: Self-pay | Admitting: *Deleted

## 2022-05-24 DIAGNOSIS — R7989 Other specified abnormal findings of blood chemistry: Secondary | ICD-10-CM

## 2022-05-26 ENCOUNTER — Encounter
Payer: No Typology Code available for payment source | Attending: Physical Medicine and Rehabilitation | Admitting: Physical Medicine and Rehabilitation

## 2022-05-26 DIAGNOSIS — R7401 Elevation of levels of liver transaminase levels: Secondary | ICD-10-CM | POA: Diagnosis not present

## 2022-05-26 DIAGNOSIS — R7303 Prediabetes: Secondary | ICD-10-CM | POA: Diagnosis not present

## 2022-05-27 NOTE — Telephone Encounter (Signed)
Please have pt to discuss the new concern in office.   For the u/s I ordered she will have to call the imaging place I referred her to to see

## 2022-05-29 NOTE — Progress Notes (Signed)
Subjective:    Patient ID: Sharon [name], female    DOB: 05-29-1966, 56 y.o.   MRN: [id number]  HPI  An audio/video tele-health visit is felt to be the most appropriate encounter for this patient at this time. This is a follow up tele-visit via phone. The patient is at home. MD is at office. Prior to scheduling this appointment, our staff discussed the limitations of evaluation and management by telemedicine and the availability of in-person appointments. The patient expressed understanding and agreed to proceed.   Sharon [name] is a 56 year old woman who presents for f/u bilateral knee pain following left knee replacement in 04/2019, transaminitis, as well as shortness of breath.   1) Bilateral knee pain L>R: -right knee pain has been getting worse -Her ROM is still restricted in her left knee.  -ice helps -radiates between hip and left knee Feels like a charley horse -She asks whether the scar tissue will ever heal.  -Cannot sleep without a pillow underneath her legs.  -She has follow-up with Dr. Mayer Camel and Dr. Ninfa Linden -Currently is not planning to pursue surgery.  -She has been off hydrocodone.  -She would be interested in increasing the Gabapentin dose.  -She would like to discuss an option for inflammation. She has tried aspirations but the fluid comes back right away.  -She got a  Steroid injection and experienced worsening range of motion in her left knee- that was from Dr. Mayer Camel. He also pulled fluid off. It does help with pain, but she does not want another given her worsening range of motion afterward. She does have improved sensation in her knee after the injection! -has been hurting -discussed Qutenza as a treatment option -her current regimen is working for her.  -has been having digestive issues and stopped meloxicam.   2) Anxiety: This has much improved with the Gabapentin.  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  -she has used blue emu oil and diclofenac gel without great benefit -has appointment with physician today.  -she does use her hands a lot.  -she has been dropping things recently.   9) lower back pain -got Xrs and was shown to have cyst on right side and bulging disc on left side and pinched nerve in the middle.  -she feels a constant burning -worst on left side -feels she is walking crooked -she had an injection by Dr. Jacelyn Grip at orthopedics but these did not help.  -she know has a bone further up that is bothering her.  -left pain in her boy friend's car so had a hard time getting in today -hurts severely sometimes -pinches when she walks -sitting also hurts -she does not have insurance right now -ice helps -she uses heat for the muscular pain -hurt it while bending and turning -has been worse since she carried her grandchild -she is unable to vacuum.  -discussed therapy and using lidocaine patch, massage.  -she is ok using gabapentin and her muscle relaxers.  -found lidocaine patch to be a lifesaver while she was at the beach.  -ready to try Qutenza today -has been severe recently -plans to follow-up with her orthopedic surgeon.   10) Constipation -has been needing to take  laxatives.   11) shortness of breath -worried abut blot clots -she asks about results of her vascular ultrasound of her lower extremity and what treatment is necessary, how she would know if there are clots elsewhere in her body, and how she may have developed these clots.  -she has being set up for tests  12) impaired memory  13) Prediabetes -she asks about HgbA1c test result  14) Transaminitis -she asks whether any of her medications could have  caused this  Prior history:  Since last visit she has stopped Norco as it caused respiratory distress. We tried Cymbalta '40mg'$  without benefit or side effects. She has tried voltaren gel, blue emu oil without benefit. She has tried CBD.   She has been trying to improve quadriceps musculature. She discusses some of the exercises she does.   She has been denied in her disability. She has talked to an attorney.  She continues to ambulate with a cane.   She has tried the compounding cream and that does not help much. She has been trying Hemp cream which is helping.  She is getting foot insoles from the podiatrist. They should be ready in a week or two.   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   Can ride the stationary bicycle and that does not hurt. She sues it every day. She uses leg weights. When   Scar tissue grew back quickly post-surgery. She could not get PT appointments for 3 weeks.   Her surgeon does not want her to break the hardware and he may have to do arthroscopic procedure to flush out the inflammation.   She has tried ice which stopped helping, nabumetone 750 mg BID, Norco 1-2 tablets three times per day PRN (she has 12 left). She uses diclofenac gel   She takes Miralax for constipation. She takes up to 2 times per day. She has a history of IBS.   [facility] in [place]- she was a Research scientist (physical sciences). They let her go because she did not return in 12 weeks.   She was doing pool therapy but chemicals in the pool were increased because of COVID.  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 Notes reviewed- recently had a right subacromial injection.   Current pain is 8/10.   12) Hip pain   Pain Inventory Average Pain 9 Pain Right Now 9 My pain is constant, sharp, burning, dull, stabbing, and aching  In the last 24 hours, has pain interfered with the  following? General activity 9 Relation with others 10 Enjoyment of life 10 What TIME of day is your pain at its worst? evening Sleep (in general) Fair  Pain is worse with: walking, bending, sitting, inactivity, and standing Pain improves with: rest, heat/ice, and medication Relief from Meds: 7     Family History  Problem Relation Age of Onset   Emphysema Mother        smoker   Allergies Mother    Asthma Mother    Heart disease Mother        AMI as cause of death   COPD Mother    Asthma Father    Heart disease Father        AMI as cause of death   Diabetes Father    Asthma Brother    Heart disease Brother 32  heart failure   Lung cancer Maternal Grandfather        was a smoker   Cancer Maternal Grandfather        lung   Heart disease Paternal Grandmother        AMI   Heart disease Paternal Grandfather        AMI   Colon cancer Neg Hx    Social History   Socioeconomic History   Marital status: Divorced    Spouse name: Not on file   Number of children: 1   Years of education: Not on file   Highest education level: Not on file  Occupational History   Occupation: Restaurant manager, fast food: [employer]  Tobacco Use   Smoking status: Never   Smokeless tobacco: Never  Vaping Use   Vaping Use: Never used  Substance and Sexual Activity   Alcohol use: Not Currently    Comment: occ   Drug use: No    Comment: former maryjuana use- quit in 1995   Sexual activity: Yes    Partners: Male    Birth control/protection: Surgical  Other Topics Concern   Not on file  Social History Narrative   Marital status: divorced x 2; dating seriously x 3 years.        Children:  1 daughter (81); no grandchildren      Lives:  With boyfriend.        Employment: works at [employer]: none      Alcohol: rarely; socially      Exercise: none; has treadmill and elliptical and bikes   Coffee in am / 1/2 of coke in afternoon    High school education       01/29/20   From: the area   Living: alone - but boyfriend living with her Sharon [name] (2020) but knew each other in high school   Work: on disability - working on appeal      Family: one daughter [name] - one grandchild coming in January       Enjoys: relax and watch tv, use to like fishing/boating/rafting, reading, crochet       Exercise: rehab exercises   Diet: not great - limited intake      Safety   Seat belts: Yes    Guns: Yes  and secure   Right handed   One story home   Drinks caffeine   Social Determinants of Health   Financial Resource Strain: Not on file  Food Insecurity: Not on file  Transportation Needs: Not on file  Physical Activity: Not on file  Stress: Not on file  Social Connections: Not on file   Past Surgical History:  Procedure Laterality Date   ABDOMINAL HYSTERECTOMY     still with both ovaries   CATARACT EXTRACTION Bilateral 2015   COLONOSCOPY     CYSTOSCOPY W/ URETERAL STENT PLACEMENT Right 05/23/2017   Procedure: CYSTOSCOPY WITH RETROGRADE PYELOGRAM/URETERAL RIGHT STENT PLACEMENT;  Surgeon: Bjorn Loser, MD;  Location: [facility];  Service: Urology;  Laterality: Right;   CYSTOSCOPY W/ URETERAL STENT PLACEMENT Right 06/04/2017   Procedure: CYSTOSCOPY WITHRIGHT RETROGRADE PYELOGRAMRIGHT /URETEREOSCOPY  STENT PLACEMENT;  Surgeon: Lucas Mallow, MD;  Location: [facility];  Service: Urology;  Laterality: Right;   ENDOMETRIAL ABLATION     HEMORRHOID SURGERY  2011   INCONTINENCE SURGERY     INNER EAR SURGERY     X 2  RECTAL PROLAPSE REPAIR     RETINAL DETACHMENT SURGERY Bilateral 2000   TOTAL KNEE ARTHROPLASTY Left 04/29/2019   Procedure: LEFT TOTAL KNEE ARTHROPLASTY;  Surgeon: Mcarthur Rossetti, MD;  Location: [facility];  Service: Orthopedics;  Laterality: Left;   TUBAL LIGATION     VEIN SURGERY Left 04/2010   Past Medical History:  Diagnosis Date   Allergy    Anal fissure    Anemia    borderline   Anxiety     Arthritis    Blood clot in vein    left leg   Detached retina    BOTH SCLERA BUCKLE BOTH EYES   DVT (deep venous thrombosis) (HCC)    left leg   Endometriosis    Family history of adverse reaction to anesthesia    DAUGHTER POST OP PONV   GERD (gastroesophageal reflux disease)    Glaucoma    RIGHT WORST THAN LEFT   Heart murmur    "caused by anxiety"    History of hemorrhoids    History of kidney stones    Hypertension    IBS (irritable bowel syndrome)    Perforated eardrum, right    SMALL HOLE   PONV (postoperative nausea and vomiting)    Pulmonary embolism (HCC) 6 YRS AGO   Status post arthroscopy of left knee 08/06/2017   LMP 10/11/2010   Opioid Risk Score:   Fall Risk Score:  `1  Depression screen [facility] 2/9     03/28/2022   10:38 AM 10/12/2021   11:35 AM 08/02/2021   11:31 AM 04/13/2021   12:05 PM 04/08/2021    2:57 PM 10/07/2020    2:29 PM 04/09/2020    3:03 PM  Depression screen PHQ 2/9  Decreased Interest '1 3 1 2 '$ 0 3 0  Down, Depressed, Hopeless '1 1 1 2 '$ 0 2 0  PHQ - 2 Score '2 4 2 4 '$ 0 5 0  Altered sleeping  1  2  0   Tired, decreased energy  '3  2  2   '$ Change in appetite  0  2  3   Feeling bad or failure about yourself   0  0  0   Trouble concentrating  0  0  0   Moving slowly or fidgety/restless  0  0  0   Suicidal thoughts  0  0  0   PHQ-9 Score  '8  10  10   '$ Difficult doing work/chores  Somewhat difficult  Extremely dIfficult  Somewhat difficult    Review of Systems  Constitutional: Negative.        Weight loss  HENT: Negative.   Eyes: Negative.   Respiratory: Positive for shortness of breath.   Cardiovascular: Negative.   Gastrointestinal: Negative.   Endocrine: Negative.   Genitourinary: Negative.   Musculoskeletal: Positive for arthralgias, back pain and gait problem.       Spasms  Skin: Negative.   Allergic/Immunologic: Negative.   Neurological: Positive for tremors and numbness.  Hematological: Negative.   Psychiatric/Behavioral:        Anxiety  All other systems reviewed and are negative.      Objective:   Not performed    Assessment & Plan:  Sharon [name] is a 56 year old woman who presents for follow-up:   1) Left knee pain post-surgery: -She has a plan for repeat surgical procedure with Dr. Ninfa Linden that will hopefully provide relief.  -She has been icing regularly- recommended three tines per day for  15 minutes. Discussed that this decreases blood flow and can impair healing but help with swelling and pain relief.  -discussed extracorporeal shockwave therapy and how this can help break apart scar tissue -unable to work due the level of her pain -She has been through physical therapy and performs her exercises diligently. --Discussed Sprint PNS system as an option of pain treatment via neuromodulation. Provided following link for patient to learn more about the system: https://www.sprtherapeutics.com/. She does not like the feeling of things under her skin and defers that option.  -She weaned herself all the steroids.  -Provided with a pain relief journal and discussed that it contains foods and lifestyle tips to naturally help to improve pain. Discussed that these lifestyle strategies are also very good for health unlike some medications which can have negative side effects. Discussed that the act of keeping a journal can be therapeutic and helpful to realize patterns what helps to trigger and alleviate pain.   -discussed mechanism of action of low dose naltrexone as an opioid receptor antagonist which stimulates your body's production of its own natural endogenous opioids, helping to decrease pain. Discussed that it can also decrease T cell response and thus be helpful in decreasing inflammation, and symptoms of brain fog, fatigue, anxiety, depression, and allergies. Discussed that this medication needs to be compounded at a compounding pharmacy and can more expensive. Discussed that I usually start at '1mg'$  and if this is not  providing enough relief then I titrate upward on a monthly basis.   -Discussed Qutenza as an option for neuropathic pain control. Discussed that this is a capsaicin patch, stronger than capsaicin cream. Discussed that it is currently approved for diabetic peripheral neuropathy and post-herpetic neuralgia, but that it has also shown benefit in treating other forms of neuropathy. Provided patient with link to site to learn more about the patch: CinemaBonus.fr. Discussed that the patch would be placed in office and benefits usually last 3 months. Discussed that unintended exposure to capsaicin can cause severe irritation of eyes, mucous membranes, respiratory tract, and skin, but that Qutenza is a local treatment and does not have the systemic side effects of other nerve medications. Discussed that there may be pain, itching, erythema, and decreased sensory function associated with the application of Qutenza. Side effects usually subside within 1 week. A cold pack of analgesic medications can help with these side effects. Blood pressure can also be increased due to pain associated with administration of the patch.  -continue ice     -Refilled Robaxin as she is nervous about feeling too sleepy with the Tizanidine, and the Robaxin does help.   -Continue abapentin to '600mg'$  TID PRN.   -Continue neoprene knee sleeve for proprioception  -She has tried Celebrex and Tramadol without benefit. She had respiratory distress with hydrocodone.   -She had worse pain with steroid injection in her left knee.   -Discussed steroids but she did not like the side effect profile. Discussed the different steroid options. She weaned off these because she developed mouth sores.   -Continue turmeric and cherries.   -Continue Lidocaine patches today.   -She would like rheum panel checked: ordered, reviewed results which were negative, and discussed with patient.  -Refilled meloxicam.   2) Bunion:  -continue  orthotics  3) Irritable bowel syndrome: -She does not eat much sugar or sweets.  -She drinks 1 coke per day.  -She cuts tea out.  -She drinks water, coffee.  -She has been going more regularly and  has not needed her Miralax.  -food allergy testing ordered  4) Impaired Concentration: - This persists -She forgets things easily.  -She tries not to multitask when she was with her grandmother.   5) Anxiety: The Gabapentin is helping with this as well, continue  6) Vulvodynia: -discussed various treatment options: biofeedback, medications, continue estradiol.  -continue Amitriptyline '10mg'$  HS, discussed moving dose earlier in the evening.   7) Cervical and lumbar myofascial pain: -try massage -discussed benefits of therapy and trigger point injections -apply lidocaine patch.   8) Bilateral hand pain: likely secondary to arthritis compounded by carpal tunnel syndrome -continue Gabapentin which is helping -continue f/u with surgery as planned.   9) Low back pain, appears to be secondary to face arthritis -discussed XR, but she defers due to potential cost -discussed surgery with an orthopedic and she would like to consider this but her insurance would not cover it.  -discussed lack of improvement with injections from Dr. Jacelyn Grip.  -continue lidocaine patches -continue postural correction -Provided with a pain relief journal and discussed that it contains foods and lifestyle tips to naturally help to improve pain. Discussed that these lifestyle strategies are also very good for health unlike some medications which can have negative side effects. Discussed that the act of keeping a journal can be therapeutic and helpful to realize patterns what helps to trigger and alleviate pain.   -Discussed following foods that may reduce pain: 1) Ginger (especially studied for arthritis)- reduce leukotriene production to decrease inflammation 2) Blueberries- high in phytonutrients that decrease  inflammation 3) Salmon- marine omega-3s reduce joint swelling and pain 4) Pumpkin seeds- reduce inflammation 5) dark chocolate- reduces inflammation 6) turmeric- reduces inflammation 7) tart cherries - reduce pain and stiffness 8) extra virgin olive oil - its compound olecanthal helps to block prostaglandins  9) chili peppers- can be eaten or applied topically via capsaicin 10) mint- helpful for headache, muscle aches, joint pain, and itching 11) garlic- reduces inflammation  Link to further information on diet for chronic pain: http://www.randall.com/   10) shoulder pain -used to get shots in the pt, but laying on her back has helped to decrease her shoulder pain  11) Acid reflux -continues famotidine.   11) Soreness in legs -vascular ultrasound ordered of both legs -discussed her history of clots -discussed that a D-dimer test can suggest clot if elevated.   12) Irritable bowel syndrome -discussed her current diet -discussed that she does not eat breakfast due to nausea -discussed her diarrhea and constipation, more diarrheal type at this time.  -discussed her aunt's advise to have a tsp coconut oil daily.  -recommended bioptemizer's magnesium breakthrough  13) Insomnia/Shortness of breath with history of clots -discussed that there is no evidence of PE on her CTA -discussed her follow-up with PCP -discussed that her vascular ultrasound of her lower extremities shows a chronic left peroneal CVT  -recommended starting nattokinase to help thin the blood and lower her risk for clots, or to eat natto -discussed the importance of movement in clot prevention -referred for sleep study for shortness of breath  14) Impaired memory: -discussed her memory difficulties.   15) Hemorroids: Recommended Epsom salt baths  16) prediabetes - recommended avoiding processed foods/added sugars/bread/pasta/rice -discussed  that hgbA1c has increased to 5.7 -recommended drinking a glass of water with either apple cide vinegar or lemon -recommended reading Wheat Belly and Grain Brain -recommended eating fruits, vegetables, yogurt, nuts, olive oil -discussed risks and benefits of metformin  17) Transaminitis -  recommended avoiding ultraprocessed foods -recommended avoiding/minimizing tylenol -recommended supplement NAC '600mg'$  BID  -recommended drinking dandelion and milk thistle teas -commended on stopping drinking soda products! -recommended avoiding processed creamer in coffee   40 minutes spent in discussion of her transaminitis, recommended avoiding ultraprocessed foods, avoiding/minimizing tylenol use, recommended supplementing NAC '600mg'$  BID, recommended drinking dandelion and milk thistle teas, discussed risks and benefits of metformin for prediabetes, recommended reading Wheat Belly and Grain Brain books, commended on stopping drinking soda products, recommended avoiding processed creamer in coffee

## 2022-06-07 ENCOUNTER — Ambulatory Visit
Admission: RE | Admit: 2022-06-07 | Discharge: 2022-06-07 | Disposition: A | Discharge status: Home / Self Care | Payer: No Typology Code available for payment source | Source: Ambulatory Visit | Attending: Family | Admitting: Family

## 2022-06-07 DIAGNOSIS — R7989 Other specified abnormal findings of blood chemistry: Secondary | ICD-10-CM

## 2022-06-23 ENCOUNTER — Telehealth: Payer: Self-pay | Admitting: Primary Care

## 2022-06-23 NOTE — Telephone Encounter (Signed)
Referral sent over by Social Services to Ms. Volanda Napoleon auth sleep study but they spelled PT name wrong and put in her maiden name. Here is the correct name and SP:   [facility] (wrong) Nichelson (correct)  Merna is new insurance: [facility] ID # [id number]  Medicaid # [id number] R  She can be contacted: [phone]

## 2022-06-26 NOTE — Telephone Encounter (Signed)
Patient is scheduled for 1/12 by Judeen Hammans and aware of the appt

## 2022-06-26 NOTE — Telephone Encounter (Signed)
Patient's name and insurance is correct in the system. Patient needs to schedule HST- transferred to [facility] to get that scheduled- Patient states there are certain days she can do it, so she wanted to wait to schedule with sleep doc until after HST is scheduled.

## 2022-06-27 NOTE — Telephone Encounter (Signed)
CMA has scheduled patient with Derl Barrow, NP on 07/26/2022 due to patient availability to discuss HST- nothing further needed.

## 2022-06-28 ENCOUNTER — Telehealth: Payer: Self-pay | Admitting: Family

## 2022-06-28 NOTE — Telephone Encounter (Signed)
Prescription Request  06/28/2022  Is this a "Controlled Substance" medicine? No  LOV: 04/11/2022  What is the name of the medication or equipment? clonazePAM (KLONOPIN) 0.5 MG tablet  nebivolol (BYSTOLIC) 5 MG tablet   Have you contacted your pharmacy to request a refill? Yes   Which pharmacy would you like this sent to?  [id number] [place] [facility] [address] [place] [address]Phone: [phone]: [phone]   Patient notified that their request is being sent to the clinical staff for review and that they should receive a response within 2 business days.   Please advise at Mobile [phone](mobile)

## 2022-06-29 ENCOUNTER — Telehealth: Payer: Self-pay

## 2022-06-29 NOTE — Telephone Encounter (Signed)
[facility] - Client Nonclinical Telephone Record  [facility] Client [facility] Night - Client Client Site [place] - Night Provider AA - PHYSICIAN, NOT LISTED- MD Contact Type Call Who Is Calling Patient / Member / Family / Caregiver Caller Name Sharon [name] Caller Phone Number [phone] Call Type Message Only Information Provided Reason for Call Returning a Call from the Office Initial [place] states her pharmacy sent over the prior approval paperwork. She states her insurance is Medicaid. Additional Comment Medicaid ID number [id number] R Disp. Time Disposition Final User 06/28/2022 5:04:48 PM General Information Provided Yes Simone Curia Call Closed By: Simone Curia Transaction Date/Time: 06/28/2022 5:00:47 PM (ET   Sending note to [facility].

## 2022-06-30 ENCOUNTER — Ambulatory Visit: Payer: Medicaid Other

## 2022-06-30 DIAGNOSIS — G4733 Obstructive sleep apnea (adult) (pediatric): Secondary | ICD-10-CM

## 2022-06-30 DIAGNOSIS — R0683 Snoring: Secondary | ICD-10-CM

## 2022-07-04 ENCOUNTER — Other Ambulatory Visit (HOSPITAL_COMMUNITY): Payer: Self-pay

## 2022-07-04 NOTE — Telephone Encounter (Signed)
Patient Advocate Encounter   Received notification from [facility] that prior authorization for Nebivolol '5mg'$  is required.   PA submitted on 05/05/2023 Key [id number] Status is pending

## 2022-07-05 ENCOUNTER — Other Ambulatory Visit (HOSPITAL_COMMUNITY): Payer: Self-pay

## 2022-07-05 NOTE — Telephone Encounter (Signed)
Medicaid, needs a PA. 3 pills left

## 2022-07-05 NOTE — Telephone Encounter (Signed)
Looks like pt already has three more refills klonipin at pharmacy. Needs to request from pharmacy.

## 2022-07-05 NOTE — Telephone Encounter (Signed)
Ran test claim for Clonazepam. Received paid claim. No PA needed

## 2022-07-05 NOTE — Telephone Encounter (Signed)
Pharmacy Patient Advocate Encounter  Received notification from [facility] that the request for prior authorization for Nebivolol HCl '5MG'$  tablets has been denied due to not trying and failing at least 2 preferred drugs.      You may call [phone] or fax [phone], to appeal. (Appeal form can be found on CMM, Key: [id number])  Please be advised we currently do not have a Pharmacist to review denials. If you would like [place] to submit it on your behalf, please provide clinical information to support your reason for appeal and any pertinent information you would like [place] to include with the appeal request. Appeals may take longer 5 business days to be submitted as we prepares necessary documentation. Thanks for your support.  How would you like to proceed?

## 2022-07-06 ENCOUNTER — Other Ambulatory Visit: Payer: Self-pay | Admitting: Physical Medicine and Rehabilitation

## 2022-07-06 NOTE — Telephone Encounter (Signed)
  Can we look into this?  Patient been on Bystolic for a while now.

## 2022-07-07 NOTE — Telephone Encounter (Signed)
Patient Advocate Encounter  Received a fax from [facility] regarding Prior Authorization for Nebivolol '5MG'$  Tablet.   Authorization has been DENIED due to     . Determination letter attached to patient chart

## 2022-07-07 NOTE — Telephone Encounter (Signed)
Called pharmacy was on hold for over 10 minutes had to disconnect call.

## 2022-07-11 ENCOUNTER — Encounter
Payer: Medicare Other | Attending: Physical Medicine and Rehabilitation | Admitting: Physical Medicine and Rehabilitation

## 2022-07-11 ENCOUNTER — Encounter: Payer: Self-pay | Admitting: Physical Medicine and Rehabilitation

## 2022-07-11 VITALS — BP 133/91 | HR 80 | Ht 67.0 in | Wt 168.0 lb

## 2022-07-11 DIAGNOSIS — Z91018 Allergy to other foods: Secondary | ICD-10-CM | POA: Diagnosis present

## 2022-07-11 DIAGNOSIS — R7303 Prediabetes: Secondary | ICD-10-CM | POA: Insufficient documentation

## 2022-07-11 DIAGNOSIS — R7989 Other specified abnormal findings of blood chemistry: Secondary | ICD-10-CM | POA: Diagnosis present

## 2022-07-11 DIAGNOSIS — G8929 Other chronic pain: Secondary | ICD-10-CM | POA: Insufficient documentation

## 2022-07-11 DIAGNOSIS — M25562 Pain in left knee: Secondary | ICD-10-CM | POA: Diagnosis present

## 2022-07-11 DIAGNOSIS — M25561 Pain in right knee: Secondary | ICD-10-CM | POA: Diagnosis present

## 2022-07-11 DIAGNOSIS — R7401 Elevation of levels of liver transaminase levels: Secondary | ICD-10-CM | POA: Insufficient documentation

## 2022-07-11 DIAGNOSIS — M5440 Lumbago with sciatica, unspecified side: Secondary | ICD-10-CM | POA: Insufficient documentation

## 2022-07-11 NOTE — Addendum Note (Signed)
Addended by: Izora Ribas on: 07/11/2022 01:06 PM   Modules accepted: Orders

## 2022-07-11 NOTE — Progress Notes (Addendum)
Subjective:    Patient ID: Sharon [name], female    DOB: December 25, 1965, 57 y.o.   MRN: [id number]  HPI   Sharon [name] is a 57 year old woman who presents for f/u bilateral knee pain following left knee replacement in 04/2019, transaminitis, as well as shortness of breath.   1) Bilateral knee pain L>R: -went to church a couple times but it hurt so badly. -right knee pain has been getting worse -Her ROM is still restricted in her left knee.  -ice helps -radiates between hip and left knee Feels like a charley horse -She asks whether the scar tissue will ever heal.  -Cannot sleep without a pillow underneath her legs.  -She has follow-up with Dr. Mayer Camel and Dr. Ninfa Linden -Currently is not planning to pursue surgery.  -She has been off hydrocodone.  -She would be interested in increasing the Gabapentin dose.  -She would like to discuss an option for inflammation. She has tried aspirations but the fluid comes back right away.  -She got a  Steroid injection and experienced worsening range of motion in her left knee- that was from Dr. Mayer Camel. He also pulled fluid off. It does help with pain, but she does not want another given her worsening range of motion afterward. She does have improved sensation in her knee after the injection! -has been hurting -discussed Qutenza as a treatment option -her current regimen is working for her.  -has been having digestive issues and stopped meloxicam.   2) Anxiety: This has much improved with the Gabapentin.  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  -she has used blue emu oil and diclofenac gel without great benefit -has appointment with physician today.  -she  does use her hands a lot.  -she has been dropping things recently.   9) lower back pain -has gotten worse -got Xrs and was shown to have cyst on right side and bulging disc on left side and pinched nerve in the middle.  -she feels a constant burning -worst on left side -feels she is walking crooked -she had an injection by Dr. Jacelyn Grip at orthopedics but these did not help.  -she know has a bone further up that is bothering her.  -left pain in her boy friend's car so had a hard time getting in today -hurts severely sometimes -pinches when she walks -sitting also hurts -she does not have insurance right now -ice helps -she uses heat for the muscular pain -hurt it while bending and turning -has been worse since she carried her grandchild -she is unable to vacuum.  -discussed therapy and using lidocaine patch, massage.  -she is ok using gabapentin and her muscle relaxers.  -found lidocaine patch to be a lifesaver while she was at the beach.  -ready to try Qutenza today -has been severe recently -plans to follow-up with her orthopedic surgeon.   10) Constipation -has been needing to take laxatives.   11) shortness of breath -worried abut blot clots -she asks about results of her vascular ultrasound of her lower extremity and what treatment is necessary, how she would know if there are clots elsewhere in her body, and how she may have developed these clots.  -she has being  set up for tests  12) impaired memory  13) Prediabetes -she asks about HgbA1c test result  14) Transaminitis -she asks whether any of her medications could have caused this  Prior history:  Since last visit she has stopped Norco as it caused respiratory distress. We tried Cymbalta 57m without benefit or side effects. She has tried voltaren gel, blue emu oil without benefit. She has tried CBD.   She has been trying to improve quadriceps musculature. She discusses some of the exercises she does.   She has  been denied in her disability. She has talked to an attorney.  She continues to ambulate with a cane.   She has tried the compounding cream and that does not help much. She has been trying Hemp cream which is helping.  She is getting foot insoles from the podiatrist. They should be ready in a week or two.   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   Can ride the stationary bicycle and that does not hurt. She sues it every day. She uses leg weights. When   Scar tissue grew back quickly post-surgery. She could not get PT appointments for 3 weeks.   Her surgeon does not want her to break the hardware and he may have to do arthroscopic procedure to flush out the inflammation.   She has tried ice which stopped helping, nabumetone 750 mg BID, Norco 1-2 tablets three times per day PRN (she has 12 left). She uses diclofenac gel   She takes Miralax for constipation. She takes up to 2 times per day. She has a history of IBS.   [facility] [place] she was a rResearch scientist (physical sciences) They let her go because she did not return in 12 weeks.   She was doing pool therapy but chemicals in the pool were increased because of COVID.  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 Notes reviewed- recently had a right subacromial injection.   Current pain is 8/10.   12) Hip pain   Pain Inventory Average Pain 10 Pain Right Now 8 My pain is constant, sharp, burning, dull, stabbing, and aching  In the last 24 hours, has pain interfered with the following? General activity 9 Relation with others 9 Enjoyment of life 10 What TIME of day is your pain at its worst? evening, daytime & night Sleep (in general) Fair  Pain is worse with: walking, bending, sitting, inactivity, and standing Pain improves with: rest, heat/ice, and medication Relief from Meds: 6     Family History  Problem Relation  Age of Onset   Emphysema Mother        smoker   Allergies Mother    Asthma Mother    Heart disease Mother        AMI as cause of death   COPD Mother    Asthma Father    Heart disease Father        AMI as cause of death   Diabetes Father    Asthma Brother    Heart disease Brother 561      heart failure   Lung cancer Maternal Grandfather        was a smoker   Cancer Maternal Grandfather        lung   Heart disease Paternal Grandmother        AMI   Heart disease  Paternal Grandfather        AMI   Colon cancer Neg Hx    Social History   Socioeconomic History   Marital status: Divorced    Spouse name: Not on file   Number of children: 1   Years of education: Not on file   Highest education level: Not on file  Occupational History   Occupation: Restaurant manager, fast food: [employer]  Tobacco Use   Smoking status: Never   Smokeless tobacco: Never  Vaping Use   Vaping Use: Never used  Substance and Sexual Activity   Alcohol use: Not Currently    Comment: occ   Drug use: No    Comment: former maryjuana use- quit in 1995   Sexual activity: Yes    Partners: Male    Birth control/protection: Surgical  Other Topics Concern   Not on file  Social History Narrative   Marital status: divorced x 2; dating seriously x 3 years.        Children:  1 daughter (56); no grandchildren      Lives:  With boyfriend.        Employment: works at [employer]: none      Alcohol: rarely; socially      Exercise: none; has treadmill and elliptical and bikes   Coffee in am / 1/2 of coke in afternoon    High school education      01/29/20   From: the area   Living: alone - but boyfriend living with her Wilfred Lacy (2020) but knew each other in high school   Work: on disability - working on appeal      Family: one daughter [name] - one grandchild coming in January       Enjoys: relax and watch tv, use to like fishing/boating/rafting, reading, crochet        Exercise: rehab exercises   Diet: not great - limited intake      Safety   Seat belts: Yes    Guns: Yes  and secure   Right handed   One story home   Drinks caffeine   Social Determinants of Health   Financial Resource Strain: Not on file  Food Insecurity: Not on file  Transportation Needs: Not on file  Physical Activity: Not on file  Stress: Not on file  Social Connections: Not on file   Past Surgical History:  Procedure Laterality Date   ABDOMINAL HYSTERECTOMY     still with both ovaries   CATARACT EXTRACTION Bilateral 2015   COLONOSCOPY     CYSTOSCOPY W/ URETERAL STENT PLACEMENT Right 05/23/2017   Procedure: CYSTOSCOPY WITH RETROGRADE PYELOGRAM/URETERAL RIGHT STENT PLACEMENT;  Surgeon: Bjorn Loser, MD;  Location: [facility];  Service: Urology;  Laterality: Right;   CYSTOSCOPY W/ URETERAL STENT PLACEMENT Right 06/04/2017   Procedure: CYSTOSCOPY WITHRIGHT RETROGRADE PYELOGRAMRIGHT /URETEREOSCOPY  STENT PLACEMENT;  Surgeon: Lucas Mallow, MD;  Location: [facility];  Service: Urology;  Laterality: Right;   ENDOMETRIAL ABLATION     HEMORRHOID SURGERY  2011   INCONTINENCE SURGERY     INNER EAR SURGERY     X 2   RECTAL PROLAPSE REPAIR     RETINAL DETACHMENT SURGERY Bilateral 2000   TOTAL KNEE ARTHROPLASTY Left 04/29/2019   Procedure: LEFT TOTAL KNEE ARTHROPLASTY;  Surgeon: Mcarthur Rossetti, MD;  Location: [place];  Service: Orthopedics;  Laterality: Left;   TUBAL LIGATION  VEIN SURGERY Left 04/2010   Past Medical History:  Diagnosis Date   Allergy    Anal fissure    Anemia    borderline   Anxiety    Arthritis    Blood clot in vein    left leg   Detached retina    BOTH SCLERA BUCKLE BOTH EYES   DVT (deep venous thrombosis) (HCC)    left leg   Endometriosis    Family history of adverse reaction to anesthesia    DAUGHTER POST OP PONV   GERD (gastroesophageal reflux disease)    Glaucoma    RIGHT WORST THAN LEFT   Heart murmur     "caused by anxiety"    History of hemorrhoids    History of kidney stones    Hypertension    IBS (irritable bowel syndrome)    Perforated eardrum, right    SMALL HOLE   PONV (postoperative nausea and vomiting)    Pulmonary embolism (HCC) 6 YRS AGO   Status post arthroscopy of left knee 08/06/2017   BP (!) 133/91   Pulse 80   Ht 5' 7"$  (1.702 m)   Wt 168 lb (76.2 kg)   LMP 10/11/2010   SpO2 98%   BMI 26.31 kg/m   Opioid Risk Score:   Fall Risk Score:  `1  Depression screen [facility] 2/9     07/11/2022   12:44 PM 03/28/2022   10:38 AM 10/12/2021   11:35 AM 08/02/2021   11:31 AM 04/13/2021   12:05 PM 04/08/2021    2:57 PM 10/07/2020    2:29 PM  Depression screen PHQ 2/9  Decreased Interest 1 1 3 1 2 $ 0 3  Down, Depressed, Hopeless 1 1 1 1 2 $ 0 2  PHQ - 2 Score 2 2 4 2 4 $ 0 5  Altered sleeping   1  2  0  Tired, decreased energy   3  2  2  $ Change in appetite   0  2  3  Feeling bad or failure about yourself    0  0  0  Trouble concentrating   0  0  0  Moving slowly or fidgety/restless   0  0  0  Suicidal thoughts   0  0  0  PHQ-9 Score   8  10  10  $ Difficult doing work/chores   Somewhat difficult  Extremely dIfficult  Somewhat difficult   Review of Systems  Constitutional: Negative.        Weight loss  HENT: Negative.   Eyes: Negative.   Respiratory: Positive for shortness of breath.   Cardiovascular: Negative.   Gastrointestinal: Negative.   Endocrine: Negative.   Genitourinary: Negative.   Musculoskeletal: Positive for arthralgias, back pain and gait problem.       Spasms  Skin: Negative.   Allergic/Immunologic: Negative.   Neurological: Positive for tremors and numbness.  Hematological: Negative.   Psychiatric/Behavioral:       Anxiety  All other systems reviewed and are negative.      Objective:  Gen: no distress, normal appearing HEENT: oral mucosa pink and moist, NCAT Cardio: Reg rate Chest: normal effort, normal rate of breathing Abd: soft,  non-distended Ext: no edema Psych: pleasant, normal affect Skin: onchomychosis of right third digit nail Neuro: Alert and oriented x3 MSK: TTP b/l knees and low back pain, ambulating cane Assessment & Plan:  Mrs. Mitton is a 57 year old woman who presents for follow-up:   1) Left knee pain  post-surgery: -She has a plan for repeat surgical procedure with Dr. Ninfa Linden that will hopefully provide relief.  -She has been icing regularly- recommended three tines per day for 15 minutes. Discussed that this decreases blood flow and can impair healing but help with swelling and pain relief.  -discussed extracorporeal shockwave therapy and how this can help break apart scar tissue -unable to work due the level of her pain -discussed current impact on her life -continue cane for balance -She has been through physical therapy and performs her exercises diligently. --Discussed Sprint PNS system as an option of pain treatment via neuromodulation. Provided following link for patient to learn more about the system: https://www.sprtherapeutics.com/. She does not like the feeling of things under her skin and defers that option.  -She weaned herself all the steroids.  -Provided with a pain relief journal and discussed that it contains foods and lifestyle tips to naturally help to improve pain. Discussed that these lifestyle strategies are also very good for health unlike some medications which can have negative side effects. Discussed that the act of keeping a journal can be therapeutic and helpful to realize patterns what helps to trigger and alleviate pain.   -discussed mechanism of action of low dose naltrexone as an opioid receptor antagonist which stimulates your body's production of its own natural endogenous opioids, helping to decrease pain. Discussed that it can also decrease T cell response and thus be helpful in decreasing inflammation, and symptoms of brain fog, fatigue, anxiety, depression, and  allergies. Discussed that this medication needs to be compounded at a compounding pharmacy and can more expensive. Discussed that I usually start at 60m and if this is not providing enough relief then I titrate upward on a monthly basis.   -Discussed Qutenza as an option for neuropathic pain control. Discussed that this is a capsaicin patch, stronger than capsaicin cream. Discussed that it is currently approved for diabetic peripheral neuropathy and post-herpetic neuralgia, but that it has also shown benefit in treating other forms of neuropathy. Provided patient with link to site to learn more about the patch: hCinemaBonus.fr Discussed that the patch would be placed in office and benefits usually last 3 months. Discussed that unintended exposure to capsaicin can cause severe irritation of eyes, mucous membranes, respiratory tract, and skin, but that Qutenza is a local treatment and does not have the systemic side effects of other nerve medications. Discussed that there may be pain, itching, erythema, and decreased sensory function associated with the application of Qutenza. Side effects usually subside within 1 week. A cold pack of analgesic medications can help with these side effects. Blood pressure can also be increased due to pain associated with administration of the patch.  -continue ice     -Refilled Robaxin as she is nervous about feeling too sleepy with the Tizanidine, and the Robaxin does help.   Continue gabapentin to 6012mTID PRN.   -Continue neoprene knee sleeve for proprioception  -She has tried Celebrex and Tramadol without benefit. She had respiratory distress with hydrocodone.   -She had worse pain with steroid injection in her left knee.   -Discussed steroids but she did not like the side effect profile. Discussed the different steroid options. She weaned off these because she developed mouth sores.   -Continue turmeric and cherries.   -Continue Lidocaine patches  today.   -She would like rheum panel checked: ordered, reviewed results which were negative, and discussed with patient.  -Refilled meloxicam.   2) Bunion:  -continue  orthotics  3) Irritable bowel syndrome: -She does not eat much sugar or sweets.  -She drinks 1 coke per day.  -She cuts tea out.  -She drinks water, coffee.  -She has been going more regularly and has not needed her Miralax.  -food allergy testing ordered  4) Impaired Concentration: - This persists -She forgets things easily.  -She tries not to multitask when she was with her grandmother.   5) Anxiety: The Gabapentin is helping with this as well, continue  6) Vulvodynia: -discussed various treatment options: biofeedback, medications, continue estradiol.  -continue Amitriptyline 50m HS, discussed moving dose earlier in the evening.   7) Cervical and lumbar myofascial pain: -try massage -discussed benefits of therapy and trigger point injections -apply lidocaine patch.   8) Bilateral hand pain: likely secondary to arthritis compounded by carpal tunnel syndrome -continue Gabapentin which is helping -continue f/u with surgery as planned.   9) Low back pain, appears to be secondary to face arthritis -discussed XR, but she defers due to potential cost I have performed face to face eval today and patient would benefit from power wheelchair -discussed surgery with an orthopedic and she would like to consider this but her insurance would not cover it.  -discussed lack of improvement with injections from Dr. WJacelyn Grip  -continue lidocaine patches -continue postural correction -Provided with a pain relief journal and discussed that it contains foods and lifestyle tips to naturally help to improve pain. Discussed that these lifestyle strategies are also very good for health unlike some medications which can have negative side effects. Discussed that the act of keeping a journal can be therapeutic and helpful to realize  patterns what helps to trigger and alleviate pain.   -Discussed following foods that may reduce pain: 1) Ginger (especially studied for arthritis)- reduce leukotriene production to decrease inflammation 2) Blueberries- high in phytonutrients that decrease inflammation 3) Salmon- marine omega-3s reduce joint swelling and pain 4) Pumpkin seeds- reduce inflammation 5) dark chocolate- reduces inflammation 6) turmeric- reduces inflammation 7) tart cherries - reduce pain and stiffness 8) extra virgin olive oil - its compound olecanthal helps to block prostaglandins  9) chili peppers- can be eaten or applied topically via capsaicin 10) mint- helpful for headache, muscle aches, joint pain, and itching 11) garlic- reduces inflammation  Link to further information on diet for chronic pain: hhttp://www.randall.com/  10) shoulder pain -used to get shots in the pt, but laying on her back has helped to decrease her shoulder pain  11) Acid reflux -continues famotidine.   11) Soreness in legs -vascular ultrasound ordered of both legs -discussed her history of clots -discussed that a D-dimer test can suggest clot if elevated.   12) Irritable bowel syndrome -discussed her current diet -discussed that she does not eat breakfast due to nausea -discussed her diarrhea and constipation, more diarrheal type at this time.  -discussed her aunt's advise to have a tsp coconut oil daily.  -recommended bioptemizer's magnesium breakthrough -check for food allergies  13) Insomnia/Shortness of breath with history of clots -discussed that there is no evidence of PE on her CTA -discussed her follow-up with PCP -discussed that her vascular ultrasound of her lower extremities shows a chronic left peroneal CVT  -recommended starting nattokinase to help thin the blood and lower her risk for clots, or to eat natto -discussed the importance of  movement in clot prevention -referred for sleep study for shortness of breath  14) Impaired memory: -discussed her memory difficulties.   15)  Hemorroids: Recommended Epsom salt baths  16) prediabetes - recommended avoiding processed foods/added sugars/bread/pasta/rice -discussed that hgbA1c has increased to 5.7 -recommended drinking a glass of water with either apple cide vinegar or lemon -recommended reading Wheat Belly and Grain Brain -recommended eating fruits, vegetables, yogurt, nuts, olive oil -discussed risks and benefits of metformin -check hgbA1c  17) Transaminitis -recommended avoiding ultraprocessed foods -recommended avoiding/minimizing tylenol -recommended supplement NAC 676m BID  -recommended drinking dandelion and milk thistle teas -commended on stopping drinking soda products! -recommended avoiding processed creamer in coffee, continue drinking coffee -check CMP today

## 2022-07-12 LAB — ALLERGENS (22) FOODS IGG
Casein IgG: 16.2 ug/mL — ABNORMAL HIGH (ref 0.0–1.9)
Cheese, Mold Type IgG: 12.5 ug/mL — ABNORMAL HIGH (ref 0.0–1.9)
Chicken IgG: <2 ug/mL (ref 0.0–1.9)
Chili Pepper IgG: 11.7 ug/mL — ABNORMAL HIGH (ref 0.0–1.9)
Chocolate/Cacao IgG: 4.5 ug/mL — ABNORMAL HIGH (ref 0.0–1.9)
Coffee IgG: 3.7 ug/mL — ABNORMAL HIGH (ref 0.0–1.9)
Corn IgG: 4.1 ug/mL — ABNORMAL HIGH (ref 0.0–1.9)
Egg, Whole IgG: 16.8 ug/mL — ABNORMAL HIGH (ref 0.0–1.9)
Green Bean IgG: <2 ug/mL (ref 0.0–1.9)
Haddock IgG: <2 ug/mL (ref 0.0–1.9)
Lamb IgG: 2.1 ug/mL — ABNORMAL HIGH (ref 0.0–1.9)
Oat IgG: 8.8 ug/mL — ABNORMAL HIGH (ref 0.0–1.9)
Onion IgG: 3.6 ug/mL — ABNORMAL HIGH (ref 0.0–1.9)
Peanut IgG: 2.1 ug/mL — ABNORMAL HIGH (ref 0.0–1.9)
Pork IgG: 2.9 ug/mL — ABNORMAL HIGH (ref 0.0–1.9)
Potato, White, IgG: <2 ug/mL (ref 0.0–1.9)
Rye IgG: 8.2 ug/mL — ABNORMAL HIGH (ref 0.0–1.9)
Shrimp IgG: <2 ug/mL (ref 0.0–1.9)
Soybean IgG: <2 ug/mL (ref 0.0–1.9)
Tomato IgG: 3 ug/mL — ABNORMAL HIGH (ref 0.0–1.9)
Wheat IgG: 4.9 ug/mL — ABNORMAL HIGH (ref 0.0–1.9)
Yeast IgG: 5.3 ug/mL — ABNORMAL HIGH (ref 0.0–1.9)

## 2022-07-12 LAB — COMPREHENSIVE METABOLIC PANEL
ALT: 20 IU/L (ref 0–32)
AST: 17 IU/L (ref 0–40)
Albumin/Globulin Ratio: 1.8 (ref 1.2–2.2)
Albumin: 4.6 g/dL (ref 3.8–4.9)
Alkaline Phosphatase: 102 IU/L (ref 44–121)
BUN/Creatinine Ratio: 16 (ref 9–23)
BUN: 13 mg/dL (ref 6–24)
Bilirubin Total: 0.3 mg/dL (ref 0.0–1.2)
CO2: 26 mmol/L (ref 20–29)
Calcium: 9.7 mg/dL (ref 8.7–10.2)
Chloride: 103 mmol/L (ref 96–106)
Creatinine, Ser: 0.83 mg/dL (ref 0.57–1.00)
Globulin, Total: 2.5 g/dL (ref 1.5–4.5)
Glucose: 74 mg/dL (ref 70–99)
Potassium: 4.5 mmol/L (ref 3.5–5.2)
Sodium: 143 mmol/L (ref 134–144)
Total Protein: 7.1 g/dL (ref 6.0–8.5)
eGFR: 82 mL/min/{1.73_m2} (ref >59)

## 2022-07-12 LAB — HEMOGLOBIN A1C
Est. average glucose Bld gHb Est-mCnc: 111 mg/dL
Hgb A1c MFr Bld: 5.5 % (ref 4.8–5.6)

## 2022-07-13 NOTE — Telephone Encounter (Signed)
Called patent verified that she has been able to pick up medications. She is changing insurance so she let us know any trouble filling next time.

## 2022-07-17 ENCOUNTER — Encounter (HOSPITAL_BASED_OUTPATIENT_CLINIC_OR_DEPARTMENT_OTHER): Payer: Medicare Other | Admitting: Physical Medicine and Rehabilitation

## 2022-07-17 DIAGNOSIS — R7401 Elevation of levels of liver transaminase levels: Secondary | ICD-10-CM | POA: Diagnosis not present

## 2022-07-17 DIAGNOSIS — Z91018 Allergy to other foods: Secondary | ICD-10-CM | POA: Diagnosis not present

## 2022-07-17 NOTE — Progress Notes (Signed)
Subjective:    Patient ID: Sharon [name], female    DOB: [birth year], 57 y.o.   MRN: [id number]  HPI   Sharon [name] is a 57 year old woman who presents for f/u bilateral knee pain following left knee replacement in 04/2019, transaminitis, shortness of breath, brain fog, and prediabetes.   1) Bilateral knee pain L>R: -went to church a couple times but it hurt so badly. -right knee pain has been getting worse -Her ROM is still restricted in her left knee.  -ice helps -radiates between hip and left knee Feels like a charley horse -She asks whether the scar tissue will ever heal.  -Cannot sleep without a pillow underneath her legs.  -She has follow-up with Dr. Mayer Camel and Dr. Ninfa Linden -Currently is not planning to pursue surgery.  -She has been off hydrocodone.  -She would be interested in increasing the Gabapentin dose.  -She would like to discuss an option for inflammation. She has tried aspirations but the fluid comes back right away.  -She got a  Steroid injection and experienced worsening range of motion in her left knee- that was from Dr. Mayer Camel. He also pulled fluid off. It does help with pain, but she does not want another given her worsening range of motion afterward. She does have improved sensation in her knee after the injection! -has been hurting -discussed Qutenza as a treatment option -her current regimen is working for her.  -has been having digestive issues and stopped meloxicam.   2) Anxiety: This has much improved with the Gabapentin.  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  -she has used blue emu oil and diclofenac gel without great benefit -has appointment with  physician today.  -she does use her hands a lot.  -she has been dropping things recently.   9) lower back pain -has gotten worse -got Xrs and was shown to have cyst on right side and bulging disc on left side and pinched nerve in the middle.  -she feels a constant burning -worst on left side -feels she is walking crooked -she had an injection by Dr. Jacelyn Grip at orthopedics but these did not help.  -she know has a bone further up that is bothering her.  -left pain in her boy friend's car so had a hard time getting in today -hurts severely sometimes -pinches when she walks -sitting also hurts -she does not have insurance right now -ice helps -she uses heat for the muscular pain -hurt it while bending and turning -has been worse since she carried her grandchild -she is unable to vacuum.  -discussed therapy and using lidocaine patch, massage.  -she is ok using gabapentin and her muscle relaxers.  -found lidocaine patch to be a lifesaver while she was at the beach.  -ready to try Qutenza today -has been severe recently -plans to follow-up with her orthopedic surgeon.   10) Constipation -has been needing to take laxatives.   11) shortness of breath -worried abut blot clots -she asks about results of her vascular ultrasound of her lower extremity and what treatment is necessary, how she would know if there are clots elsewhere in her body, and how she may have developed these clots.  -she has  being set up for tests  12) impaired memory  13) Prediabetes -she asks about HgbA1c test result  14) Transaminitis -she asks whether any of her medications could have caused this'  15) Food allergy -she asks about her food allergy results  Prior history:  Since last visit she has stopped Norco as it caused respiratory distress. We tried Cymbalta '40mg'$  without benefit or side effects. She has tried voltaren gel, blue emu oil without benefit. She has tried CBD.   She has been trying to  improve quadriceps musculature. She discusses some of the exercises she does.   She has been denied in her disability. She has talked to an attorney.  She continues to ambulate with a cane.   She has tried the compounding cream and that does not help much. She has been trying Hemp cream which is helping.  She is getting foot insoles from the podiatrist. They should be ready in a week or two.   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   Can ride the stationary bicycle and that does not hurt. She sues it every day. She uses leg weights. When   Scar tissue grew back quickly post-surgery. She could not get PT appointments for 3 weeks.   Her surgeon does not want her to break the hardware and he may have to do arthroscopic procedure to flush out the inflammation.   She has tried ice which stopped helping, nabumetone 750 mg BID, Norco 1-2 tablets three times per day PRN (she has 12 left). She uses diclofenac gel   She takes Miralax for constipation. She takes up to 2 times per day. She has a history of IBS.   [facility] in [place]- she was a Research scientist (physical sciences). They let her go because she did not return in 12 weeks.   She was doing pool therapy but chemicals in the pool were increased because of COVID.  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 Notes reviewed- recently had a right subacromial injection.   Current pain is 8/10.   12) Hip pain   Pain Inventory Average Pain 10 Pain Right Now 8 My pain is constant, sharp, burning, dull, stabbing, and aching  In the last 24 hours, has pain interfered with the following? General activity 9 Relation with others 9 Enjoyment of life 10 What TIME of day is your pain at its worst? evening, daytime & night Sleep (in general) Fair  Pain is worse with: walking, bending, sitting, inactivity, and standing Pain improves with: rest,  heat/ice, and medication Relief from Meds: 6     Family History  Problem Relation Age of Onset   Emphysema Mother        smoker   Allergies Mother    Asthma Mother    Heart disease Mother        AMI as cause of death   COPD Mother    Asthma Father    Heart disease Father        AMI as cause of death   Diabetes Father    Asthma Brother    Heart disease Brother 90       heart failure   Lung cancer Maternal Grandfather        was a smoker   Cancer Maternal Grandfather        lung   Heart disease Paternal Grandmother  AMI   Heart disease Paternal Grandfather        AMI   Colon cancer Neg Hx    Social History   Socioeconomic History   Marital status: Divorced    Spouse name: Not on file   Number of children: 1   Years of education: Not on file   Highest education level: Not on file  Occupational History   Occupation: Restaurant manager, fast food: [employer]  Tobacco Use   Smoking status: Never   Smokeless tobacco: Never  Vaping Use   Vaping Use: Never used  Substance and Sexual Activity   Alcohol use: Not Currently    Comment: occ   Drug use: No    Comment: former maryjuana use- quit in 1995   Sexual activity: Yes    Partners: Male    Birth control/protection: Surgical  Other Topics Concern   Not on file  Social History Narrative   Marital status: divorced x 2; dating seriously x 3 years.        Children:  1 daughter (81); no grandchildren      Lives:  With boyfriend.        Employment: works at [employer]: none      Alcohol: rarely; socially      Exercise: none; has treadmill and elliptical and bikes   Coffee in am / 1/2 of coke in afternoon    High school education      01/29/20   From: the area   Living: alone - but boyfriend living with her Wilfred Lacy (2020) but knew each other in high school   Work: on disability - working on appeal      Family: one daughter [name] - one grandchild coming in January        Enjoys: relax and watch tv, use to like fishing/boating/rafting, reading, crochet       Exercise: rehab exercises   Diet: not great - limited intake      Safety   Seat belts: Yes    Guns: Yes  and secure   Right handed   One story home   Drinks caffeine   Social Determinants of Health   Financial Resource Strain: Not on file  Food Insecurity: Not on file  Transportation Needs: Not on file  Physical Activity: Not on file  Stress: Not on file  Social Connections: Not on file   Past Surgical History:  Procedure Laterality Date   ABDOMINAL HYSTERECTOMY     still with both ovaries   CATARACT EXTRACTION Bilateral 2015   COLONOSCOPY     CYSTOSCOPY W/ URETERAL STENT PLACEMENT Right 05/23/2017   Procedure: CYSTOSCOPY WITH RETROGRADE PYELOGRAM/URETERAL RIGHT STENT PLACEMENT;  Surgeon: Bjorn Loser, MD;  Location: [facility];  Service: Urology;  Laterality: Right;   CYSTOSCOPY W/ URETERAL STENT PLACEMENT Right 06/04/2017   Procedure: CYSTOSCOPY WITHRIGHT RETROGRADE PYELOGRAMRIGHT /URETEREOSCOPY  STENT PLACEMENT;  Surgeon: Lucas Mallow, MD;  Location: [facility];  Service: Urology;  Laterality: Right;   ENDOMETRIAL ABLATION     HEMORRHOID SURGERY  2011   INCONTINENCE SURGERY     INNER EAR SURGERY     X 2   RECTAL PROLAPSE REPAIR     RETINAL DETACHMENT SURGERY Bilateral 2000   TOTAL KNEE ARTHROPLASTY Left 04/29/2019   Procedure: LEFT TOTAL KNEE ARTHROPLASTY;  Surgeon: Mcarthur Rossetti, MD;  Location: [place];  Service: Orthopedics;  Laterality: Left;  TUBAL LIGATION     VEIN SURGERY Left 04/2010   Past Medical History:  Diagnosis Date   Allergy    Anal fissure    Anemia    borderline   Anxiety    Arthritis    Blood clot in vein    left leg   Detached retina    BOTH SCLERA BUCKLE BOTH EYES   DVT (deep venous thrombosis) (HCC)    left leg   Endometriosis    Family history of adverse reaction to anesthesia    DAUGHTER POST OP PONV   GERD  (gastroesophageal reflux disease)    Glaucoma    RIGHT WORST THAN LEFT   Heart murmur    "caused by anxiety"    History of hemorrhoids    History of kidney stones    Hypertension    IBS (irritable bowel syndrome)    Perforated eardrum, right    SMALL HOLE   PONV (postoperative nausea and vomiting)    Pulmonary embolism (HCC) 6 YRS AGO   Status post arthroscopy of left knee 08/06/2017   LMP 10/11/2010   Opioid Risk Score:   Fall Risk Score:  `1  Depression screen [facility] 2/9     07/11/2022   12:44 PM 03/28/2022   10:38 AM 10/12/2021   11:35 AM 08/02/2021   11:31 AM 04/13/2021   12:05 PM 04/08/2021    2:57 PM 10/07/2020    2:29 PM  Depression screen PHQ 2/9  Decreased Interest '1 1 3 1 2 '$ 0 3  Down, Depressed, Hopeless '1 1 1 1 2 '$ 0 2  PHQ - 2 Score '2 2 4 2 4 '$ 0 5  Altered sleeping   1  2  0  Tired, decreased energy   '3  2  2  '$ Change in appetite   0  2  3  Feeling bad or failure about yourself    0  0  0  Trouble concentrating   0  0  0  Moving slowly or fidgety/restless   0  0  0  Suicidal thoughts   0  0  0  PHQ-9 Score   '8  10  10  '$ Difficult doing work/chores   Somewhat difficult  Extremely dIfficult  Somewhat difficult   Review of Systems  Constitutional: Negative.        Weight loss  HENT: Negative.   Eyes: Negative.   Respiratory: Positive for shortness of breath.   Cardiovascular: Negative.   Gastrointestinal: Negative.   Endocrine: Negative.   Genitourinary: Negative.   Musculoskeletal: Positive for arthralgias, back pain and gait problem.       Spasms  Skin: Negative.   Allergic/Immunologic: Negative.   Neurological: Positive for tremors and numbness.  Hematological: Negative.   Psychiatric/Behavioral:       Anxiety  All other systems reviewed and are negative.      Objective:  Not performed Assessment & Plan:  Mrs. Forner is a 57 year old woman who presents for follow-up:   1) Left knee pain post-surgery: -She has a plan for repeat surgical procedure  with Dr. Ninfa Linden that will hopefully provide relief.  -She has been icing regularly- recommended three tines per day for 15 minutes. Discussed that this decreases blood flow and can impair healing but help with swelling and pain relief.  -discussed extracorporeal shockwave therapy and how this can help break apart scar tissue -unable to work due the level of her pain -discussed current impact on her life -continue  cane for balance -She has been through physical therapy and performs her exercises diligently. --Discussed Sprint PNS system as an option of pain treatment via neuromodulation. Provided following link for patient to learn more about the system: https://www.sprtherapeutics.com/. She does not like the feeling of things under her skin and defers that option.  -She weaned herself all the steroids.  -Provided with a pain relief journal and discussed that it contains foods and lifestyle tips to naturally help to improve pain. Discussed that these lifestyle strategies are also very good for health unlike some medications which can have negative side effects. Discussed that the act of keeping a journal can be therapeutic and helpful to realize patterns what helps to trigger and alleviate pain.   -discussed mechanism of action of low dose naltrexone as an opioid receptor antagonist which stimulates your body's production of its own natural endogenous opioids, helping to decrease pain. Discussed that it can also decrease T cell response and thus be helpful in decreasing inflammation, and symptoms of brain fog, fatigue, anxiety, depression, and allergies. Discussed that this medication needs to be compounded at a compounding pharmacy and can more expensive. Discussed that I usually start at '1mg'$  and if this is not providing enough relief then I titrate upward on a monthly basis.   -Discussed Qutenza as an option for neuropathic pain control. Discussed that this is a capsaicin patch, stronger than capsaicin  cream. Discussed that it is currently approved for diabetic peripheral neuropathy and post-herpetic neuralgia, but that it has also shown benefit in treating other forms of neuropathy. Provided patient with link to site to learn more about the patch: [employer].fr. Discussed that the patch would be placed in office and benefits usually last 3 months. Discussed that unintended exposure to capsaicin can cause severe irritation of eyes, mucous membranes, respiratory tract, and skin, but that Qutenza is a local treatment and does not have the systemic side effects of other nerve medications. Discussed that there may be pain, itching, erythema, and decreased sensory function associated with the application of Qutenza. Side effects usually subside within 1 week. A cold pack of analgesic medications can help with these side effects. Blood pressure can also be increased due to pain associated with administration of the patch.  -continue ice     -Refilled Robaxin as she is nervous about feeling too sleepy with the Tizanidine, and the Robaxin does help.   Continue gabapentin to '600mg'$  TID PRN.   -Continue neoprene knee sleeve for proprioception  -She has tried Celebrex and Tramadol without benefit. She had respiratory distress with hydrocodone.   -She had worse pain with steroid injection in her left knee.   -Discussed steroids but she did not like the side effect profile. Discussed the different steroid options. She weaned off these because she developed mouth sores.   -Continue turmeric and cherries.   -Continue Lidocaine patches today.   -She would like rheum panel checked: ordered, reviewed results which were negative, and discussed with patient.  -Refilled meloxicam.   2) Bunion:  -continue orthotics  3) Irritable bowel syndrome: -She does not eat much sugar or sweets.  -She drinks 1 coke per day.  -She cuts tea out.  -She drinks water, coffee.  -She has been going more regularly  and has not needed her Miralax.  -food allergy testing ordered  4) Impaired Concentration: - This persists -She forgets things easily.  -She tries not to multitask when she was with her grandmother.   5) Anxiety: The Gabapentin  is helping with this as well, continue  6) Vulvodynia: -discussed various treatment options: biofeedback, medications, continue estradiol.  -continue Amitriptyline '10mg'$  HS, discussed moving dose earlier in the evening.   7) Cervical and lumbar myofascial pain: -try massage -discussed benefits of therapy and trigger point injections -apply lidocaine patch.   8) Bilateral hand pain: likely secondary to arthritis compounded by carpal tunnel syndrome -continue Gabapentin which is helping -continue f/u with surgery as planned.   9) Low back pain, appears to be secondary to face arthritis -discussed XR, but she defers due to potential cost -discussed surgery with an orthopedic and she would like to consider this but her insurance would not cover it.  -discussed lack of improvement with injections from Dr. Jacelyn Grip.  -continue lidocaine patches -continue postural correction -Provided with a pain relief journal and discussed that it contains foods and lifestyle tips to naturally help to improve pain. Discussed that these lifestyle strategies are also very good for health unlike some medications which can have negative side effects. Discussed that the act of keeping a journal can be therapeutic and helpful to realize patterns what helps to trigger and alleviate pain.   -Discussed following foods that may reduce pain: 1) Ginger (especially studied for arthritis)- reduce leukotriene production to decrease inflammation 2) Blueberries- high in phytonutrients that decrease inflammation 3) Salmon- marine omega-3s reduce joint swelling and pain 4) Pumpkin seeds- reduce inflammation 5) dark chocolate- reduces inflammation 6) turmeric- reduces inflammation 7) tart cherries -  reduce pain and stiffness 8) extra virgin olive oil - its compound olecanthal helps to block prostaglandins  9) chili peppers- can be eaten or applied topically via capsaicin 10) mint- helpful for headache, muscle aches, joint pain, and itching 11) garlic- reduces inflammation  Link to further information on diet for chronic pain: http://www.randall.com/   10) shoulder pain -used to get shots in the pt, but laying on her back has helped to decrease her shoulder pain  11) Acid reflux -continues famotidine.   11) Soreness in legs -vascular ultrasound ordered of both legs -discussed her history of clots -discussed that a D-dimer test can suggest clot if elevated.   12) Irritable bowel syndrome -discussed her current diet -discussed that she does not eat breakfast due to nausea -discussed her diarrhea and constipation, more diarrheal type at this time.  -discussed her aunt's advise to have a tsp coconut oil daily.  -recommended bioptemizer's magnesium breakthrough -reviewed food allergies with her and made goal for 4 week elimination of foods with the highest responses  13) Insomnia/Shortness of breath with history of clots -discussed that there is no evidence of PE on her CTA -discussed her follow-up with PCP -discussed that her vascular ultrasound of her lower extremities shows a chronic left peroneal CVT  -recommended starting nattokinase to help thin the blood and lower her risk for clots, or to eat natto -discussed the importance of movement in clot prevention -referred for sleep study for shortness of breath  14) Impaired memory: -discussed her memory difficulties.   15) Hemorroids: Recommended Epsom salt baths  16) prediabetes - recommended avoiding processed foods/added sugars/bread/pasta/rice -recommended drinking a glass of water with either apple cide vinegar or lemon -recommended reading Wheat  Belly and Grain Brain -recommended eating fruits, vegetables, yogurt, nuts, olive oil -discussed risks and benefits of metformin -discussed that repeat labs show normalization of HgbA1c!  17) Transaminitis -recommended avoiding ultraprocessed foods -recommended avoiding/minimizing tylenol -recommended supplement NAC '600mg'$  BID  -recommended drinking dandelion and milk thistle teas -  commended on stopping drinking soda products! -recommended avoiding processed creamer in coffee, continue drinking coffee -discussed that repeat CMP shows normalization of liver enzymes!  30 minutes spent in discussion of her CMP, HgbA1c, and food allergy results, discussed that her livery enzymes and HgbA1c have normalized and commended on her dietary changes, discussed that she had multiple increased antibody responses to several foods including wheat/casein/cheese/eggs/chili pepper and normal responses to fish/chicken/soybean/shrimp, discussed with her a potential 4 week elimination diet

## 2022-07-19 DIAGNOSIS — G4733 Obstructive sleep apnea (adult) (pediatric): Secondary | ICD-10-CM | POA: Diagnosis not present

## 2022-07-25 ENCOUNTER — Ambulatory Visit (INDEPENDENT_AMBULATORY_CARE_PROVIDER_SITE_OTHER): Payer: Medicare Other | Admitting: Gastroenterology

## 2022-07-25 VITALS — BP 143/95 | HR 66 | Ht 67.0 in | Wt 165.8 lb

## 2022-07-25 DIAGNOSIS — K219 Gastro-esophageal reflux disease without esophagitis: Secondary | ICD-10-CM

## 2022-07-25 NOTE — Progress Notes (Signed)
Cephas Darby, MD [address]  [place]  [place],  [address]  Main: [phone]  Fax: [phone]    Gastroenterology Consultation  Referring Provider:     Eugenia Pancoast, FNP Primary Care Physician:  Eugenia Pancoast, FNP Primary Gastroenterologist:  Dr. Cephas Darby Reason for Consultation: Chronic GERD        HPI:   Sharon [name] is a 57 y.o. female referred by  Eugenia Pancoast, [place]  for consultation & management of chronic symptoms of burning in the epigastric area as well as in the chest.  Has been taking Pepcid 20 mg twice daily which provides minimal relief only.  Her PCP has switched her to pantoprazole 40 mg once daily.  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  She denies any difficulty swallowing.  She is followed by pulmonary for sleep apnea  NSAIDs: None  Antiplts/Anticoagulants/Anti thrombotics: None  GI Procedures: Screening colonoscopy 01/07/2016  Surgical [P], cecum, polyp - HYPERPLASTIC COLONIC POLYP. - NO ADENOMATOUS CHANGE OR MALIGNANCY IDENTIFIED. Past Medical History:  Diagnosis Date   Allergy    Anal fissure    Anemia    borderline   Anxiety    Arthritis    Blood clot in vein    left leg   Detached retina    BOTH SCLERA BUCKLE BOTH EYES   DVT (deep venous thrombosis) (HCC)    left leg   Endometriosis    Family history of adverse reaction to anesthesia    DAUGHTER POST OP PONV   GERD (gastroesophageal reflux disease)    Glaucoma    RIGHT WORST THAN LEFT   Heart murmur    "caused by anxiety"    History of hemorrhoids    History of kidney stones    Hypertension    IBS (irritable bowel syndrome)    Perforated eardrum, right    SMALL HOLE   PONV (postoperative nausea and vomiting)    Pulmonary embolism (HCC) 6 YRS AGO   Status post arthroscopy of left knee 08/06/2017    Past Surgical History:  Procedure Laterality Date   ABDOMINAL HYSTERECTOMY     still with both ovaries    CATARACT EXTRACTION Bilateral 2015   COLONOSCOPY     CYSTOSCOPY W/ URETERAL STENT PLACEMENT Right 05/23/2017   Procedure: CYSTOSCOPY WITH RETROGRADE PYELOGRAM/URETERAL RIGHT STENT PLACEMENT;  Surgeon: Bjorn Loser, MD;  Location: [facility];  Service: Urology;  Laterality: Right;   CYSTOSCOPY W/ URETERAL STENT PLACEMENT Right 06/04/2017   Procedure: CYSTOSCOPY WITHRIGHT RETROGRADE PYELOGRAMRIGHT /URETEREOSCOPY  STENT PLACEMENT;  Surgeon: Lucas Mallow, MD;  Location: [facility];  Service: Urology;  Laterality: Right;   ENDOMETRIAL ABLATION     HEMORRHOID SURGERY  2011   INCONTINENCE SURGERY     INNER EAR SURGERY     X 2   RECTAL PROLAPSE REPAIR     RETINAL DETACHMENT SURGERY Bilateral 2000   TOTAL KNEE ARTHROPLASTY Left 04/29/2019   Procedure: LEFT TOTAL KNEE ARTHROPLASTY;  Surgeon: Mcarthur Rossetti, MD;  Location: [place];  Service: Orthopedics;  Laterality: Left;   TUBAL LIGATION     VEIN SURGERY Left 04/2010     Current Outpatient Medications:    Acetylcysteine (NAC) 600 MG CAPS, Take 600 mg by mouth 2 (two) times daily., Disp: , Rfl:    acyclovir (ZOVIRAX) 400 MG tablet, Take 1 tablet by mouth daily., Disp: , Rfl:    albuterol (VENTOLIN HFA) 108 (  90 Base) MCG/ACT inhaler, INHALE 1-2 PUFFS BY MOUTH EVERY 6 HOURS AS NEEDED FOR WHEEZE OR SHORTNESS OF BREATH, Disp: 8.5 each, Rfl: 2   amitriptyline (ELAVIL) 10 MG tablet, TAKE 1 TABLET BY MOUTH EVERYDAY AT BEDTIME, Disp: 90 tablet, Rfl: 1   bimatoprost (LUMIGAN) 0.03 % ophthalmic solution, Place 1 drop into both eyes at bedtime., Disp: , Rfl:    Cholecalciferol (VITAMIN D) 50 MCG (2000 UT) tablet, 1 tablet, Disp: , Rfl:    clobetasol (TEMOVATE) 0.05 % external solution, Apply 1 Application topically 2 (two) times daily., Disp: 50 mL, Rfl: 0   clonazePAM (KLONOPIN) 0.5 MG tablet, Take 1 tablet (0.5 mg total) by mouth at bedtime., Disp: 30 tablet, Rfl: 5   diclofenac Sodium (VOLTAREN) 1 % GEL, , Disp: , Rfl:     EPINEPHrine 0.3 mg/0.3 mL IJ SOAJ injection, Inject 0.3 mg into the muscle as needed for anaphylaxis., Disp: 1 each, Rfl: 0   famotidine (PEPCID) 40 MG tablet, TAKE 1 TABLET BY MOUTH EVERY DAY, Disp: 90 tablet, Rfl: 1   gabapentin (NEURONTIN) 600 MG tablet, Take 1 tablet by mouth 3 (three) times daily., Disp: , Rfl:    lidocaine (LIDODERM) 5 %, Apply 1 patch topically daily., Disp: , Rfl:    LUMIGAN 0.01 % SOLN, 1 drop at bedtime., Disp: , Rfl:    meloxicam (MOBIC) 15 MG tablet, TAKE 1 TABLET (15 MG TOTAL) BY MOUTH DAILY., Disp: 30 tablet, Rfl: 2   methocarbamol (ROBAXIN) 500 MG tablet, Take 1 tablet by mouth 4 (four) times daily., Disp: , Rfl:    mometasone (NASONEX) 50 MCG/ACT nasal spray, Place 2 sprays into the nose daily., Disp: 1 each, Rfl: 12   nebivolol (BYSTOLIC) 5 MG tablet, TAKE 1 TABLET BY MOUTH EVERY DAY, Disp: 90 tablet, Rfl: 3   Nitroglycerin (RECTIV) 0.4 % OINT, Place 0.4 inches rectally 2 (two) times daily as needed (For anal fissures.)., Disp: 30 g, Rfl: 1   ondansetron (ZOFRAN-ODT) 4 MG disintegrating tablet, 1 tablet on the tongue and allow to dissolve Orally Once a day for 30 day(s), Disp: , Rfl:    pantoprazole (PROTONIX) 40 MG tablet, Take 1 tablet (40 mg total) by mouth 2 (two) times daily before a meal., Disp: 180 tablet, Rfl: 1   polyethylene glycol powder (GLYCOLAX/MIRALAX) 17 GM/SCOOP powder, See admin instructions., Disp: , Rfl:    primidone (MYSOLINE) 50 MG tablet, 1 tablet Orally Once a day for 30 day(s), Disp: , Rfl:    vitamin B-12 (CYANOCOBALAMIN) 1000 MCG tablet, Take 1 tablet (1,000 mcg total) by mouth daily., Disp: 30 tablet, Rfl: 3   Zinc 50 MG TABS, 1 tablet Orally Once a day for 30 day(s), Disp: , Rfl:     Family History  Problem Relation Age of Onset   Emphysema Mother        smoker   Allergies Mother    Asthma Mother    Heart disease Mother        AMI as cause of death   COPD Mother    Asthma Father    Heart disease Father        AMI as cause of  death   Diabetes Father    Asthma Brother    Heart disease Brother 41       heart failure   Lung cancer Maternal Grandfather        was a smoker   Cancer Maternal Grandfather        lung  Heart disease Paternal Grandmother        AMI   Heart disease Paternal Grandfather        AMI   Colon cancer Neg Hx      Social History   Tobacco Use   Smoking status: Never   Smokeless tobacco: Never  Vaping Use   Vaping Use: Never used  Substance Use Topics   Alcohol use: Not Currently    Comment: occ   Drug use: No    Comment: former maryjuana use- quit in 1995    Allergies as of 07/25/2022 - Review Complete 07/25/2022  Allergen Reaction Noted   Bee venom Anaphylaxis and Other (See Comments) 09/10/2019   Penicillins Other (See Comments) and Swelling 10/11/2010   Dextromethorphan  03/22/2021   Penicillin g  03/27/2022   Dextromethorphan polistirex er Other (See Comments) 11/08/2010   Fluconazole  08/02/2018   Hydrocodone Nausea And Vomiting and Nausea Only 02/01/2012   Oxycodone-acetaminophen Other (See Comments) 05/23/2017   Tape Itching and Other (See Comments) 08/17/2017    Review of Systems:    All systems reviewed and negative except where noted in HPI.   Physical Exam:  BP (!) 143/95 (BP Location: Left Arm, Patient Position: Sitting, Cuff Size: Normal)   Pulse 66   Ht 5' 7"$  (1.702 m)   Wt 165 lb 12.8 oz (75.2 kg)   LMP 10/11/2010   BMI 25.97 kg/m  Patient's last menstrual period was 10/11/2010.  General:   Alert,  Well-developed, well-nourished, pleasant and cooperative in NAD Head:  Normocephalic and atraumatic. Eyes:  Sclera clear, no icterus.   Conjunctiva pink. Ears:  Normal auditory acuity. Nose:  No deformity, discharge, or lesions. Mouth:  No deformity or lesions,oropharynx pink & moist. Neck:  Supple; no masses or thyromegaly. Lungs:  Respirations even and unlabored.  Clear throughout to auscultation.   No wheezes, crackles, or rhonchi. No acute  distress. Heart:  Regular rate and rhythm; no murmurs, clicks, rubs, or gallops. Abdomen:  Normal bowel sounds. Soft, non-tender and non-distended without masses, hepatosplenomegaly or hernias noted.  No guarding or rebound tenderness.   Rectal: Not performed Msk:  Symmetrical without gross deformities. Good, equal movement & strength bilaterally. Pulses:  Normal pulses noted. Extremities:  No clubbing or edema.  No cyanosis. Neurologic:  Alert and oriented x3;  grossly normal neurologically. Skin:  Intact without significant lesions or rashes. No jaundice. Psych:  Alert and cooperative. Normal mood and affect.  Imaging Studies: Reviewed  Assessment and Plan:   Sharon [name] is a 57 y.o. female with history of endometriosis, hemorrhoids s/p stapled hemorrhoidectomy, history of iron deficiency anemia which has resolved, generalized anxiety disorder, sleep apnea, chronic GERD  Chronic GERD Recommend EGD for further evaluation with esophageal biopsies Continue Protonix 40 mg 1-2 times daily before meals Discussed about antireflux lifestyle   Follow up in 3 months   Cephas Darby, MD

## 2022-07-26 ENCOUNTER — Encounter: Payer: Self-pay | Admitting: Primary Care

## 2022-07-26 ENCOUNTER — Ambulatory Visit (INDEPENDENT_AMBULATORY_CARE_PROVIDER_SITE_OTHER): Payer: Medicare Other | Admitting: Primary Care

## 2022-07-26 VITALS — BP 126/80 | HR 72 | Temp 98.4°F | Ht 67.0 in | Wt 167.0 lb

## 2022-07-26 DIAGNOSIS — G4730 Sleep apnea, unspecified: Secondary | ICD-10-CM

## 2022-07-26 DIAGNOSIS — J4520 Mild intermittent asthma, uncomplicated: Secondary | ICD-10-CM

## 2022-07-26 DIAGNOSIS — J040 Acute laryngitis: Secondary | ICD-10-CM

## 2022-07-26 DIAGNOSIS — K219 Gastro-esophageal reflux disease without esophagitis: Secondary | ICD-10-CM

## 2022-07-26 DIAGNOSIS — G4733 Obstructive sleep apnea (adult) (pediatric): Secondary | ICD-10-CM | POA: Diagnosis not present

## 2022-07-26 NOTE — Assessment & Plan Note (Signed)
-   Following with GI, planning for upper endoscopy

## 2022-07-26 NOTE — Patient Instructions (Addendum)
Home sleep study on 07/02/2022 showed evidence of mild obstructive sleep apnea, you had average 7.1 apneas on average an hour  Mild OSA 5-15 apneic events an hour Moderate OSA 15-30 apneic events an hour Severe OSA > 30 apneic events an hour   Untreated sleep apnea puts you at higher risk for cardiac arrhythmias, pulmonary HTN, stroke and diabetes  Treatment options include weight loss, side sleeping position, oral appliance, CPAP therapy or referral to ENT for possible surgical options   Recommendations: Aim to wear CPAP every night min 4-6 hours or longer  Orders: Auto CPAP 5-15cm h20 with mask of choice, heated humidity  Follow-up: 2-3 months with Beth NP for CPAP compliance   CPAP and BIPAP Information CPAP and BIPAP are methods that use air pressure to keep your airways open and to help you breathe well. CPAP and BIPAP use different amounts of pressure. Your health care provider will tell you whether CPAP or BIPAP would be more helpful for you. CPAP stands for "continuous positive airway pressure." With CPAP, the amount of pressure stays the same while you breathe in (inhale) and out (exhale). BIPAP stands for "bi-level positive airway pressure." With BIPAP, the amount of pressure will be higher when you inhale and lower when you exhale. This allows you to take larger breaths. CPAP or BIPAP may be used in the hospital, or your health care provider may want you to use it at home. You may need to have a sleep study before your health care provider can order a machine for you to use at home. What are the advantages? CPAP or BIPAP can be helpful if you have: Sleep apnea. Chronic obstructive pulmonary disease (COPD). Heart failure. Medical conditions that cause muscle weakness, including muscular dystrophy or amyotrophic lateral sclerosis (ALS). Other problems that cause breathing to be shallow, weak, abnormal, or difficult. CPAP and BIPAP are most commonly used for obstructive sleep  apnea (OSA) to keep the airways from collapsing when the muscles relax during sleep. What are the risks? Generally, this is a safe treatment. However, problems may occur, including: Irritated skin or skin sores if the mask does not fit properly. Dry or stuffy nose or nosebleeds. Dry mouth. Feeling gassy or bloated. Sinus or lung infection if the equipment is not cleaned properly. When should CPAP or BIPAP be used? In most cases, the mask only needs to be worn during sleep. Generally, the mask needs to be worn throughout the night and during any daytime naps. People with certain medical conditions may also need to wear the mask at other times, such as when they are awake. Follow instructions from your health care provider about when to use the machine. What happens during CPAP or BIPAP?  Both CPAP and BIPAP are provided by a small machine with a flexible plastic tube that attaches to a plastic mask that you wear. Air is blown through the mask into your nose or mouth. The amount of pressure that is used to blow the air can be adjusted on the machine. Your health care provider will set the pressure setting and help you find the best mask for you. Tips for using the mask Because the mask needs to be snug, some people feel trapped or closed-in (claustrophobic) when first using the mask. If you feel this way, you may need to get used to the mask. One way to do this is to hold the mask loosely over your nose or mouth and then gradually apply the mask more snugly.  You can also gradually increase the amount of time that you use the mask. Masks are available in various types and sizes. If your mask does not fit well, talk with your health care provider about getting a different one. Some common types of masks include: Full face masks, which fit over the mouth and nose. Nasal masks, which fit over the nose. Nasal pillow or prong masks, which fit into the nostrils. If you are using a mask that fits over your  nose and you tend to breathe through your mouth, a chin strap may be applied to help keep your mouth closed. Use a skin barrier to protect your skin as told by your health care provider. Some CPAP and BIPAP machines have alarms that may sound if the mask comes off or develops a leak. If you have trouble with the mask, it is very important that you talk with your health care provider about finding a way to make the mask easier to tolerate. Do not stop using the mask. There could be a negative impact on your health if you stop using the mask. Tips for using the machine Place your CPAP or BIPAP machine on a secure table or stand near an electrical outlet. Know where the on/off switch is on the machine. Follow instructions from your health care provider about how to set the pressure on your machine and when you should use it. Do not eat or drink while the CPAP or BIPAP machine is on. Food or fluids could get pushed into your lungs by the pressure of the CPAP or BIPAP. For home use, CPAP and BIPAP machines can be rented or purchased through home health care companies. Many different brands of machines are available. Renting a machine before purchasing may help you find out which particular machine works well for you. Your health insurance company may also decide which machine you may get. Keep the CPAP or BIPAP machine and attachments clean. Ask your health care provider for specific instructions. Check the humidifier if you have a dry stuffy nose or nosebleeds. Make sure it is working correctly. Follow these instructions at home: Take over-the-counter and prescription medicines only as told by your health care provider. Ask if you can take sinus medicine if your sinuses are blocked. Do not use any products that contain nicotine or tobacco. These products include cigarettes, chewing tobacco, and vaping devices, such as e-cigarettes. If you need help quitting, ask your health care provider. Keep all  follow-up visits. This is important. Contact a health care provider if: You have redness or pressure sores on your head, face, mouth, or nose from the mask or head gear. You have trouble using the CPAP or BIPAP machine. You cannot tolerate wearing the CPAP or BIPAP mask. Someone tells you that you snore even when wearing your CPAP or BIPAP. Get help right away if: You have trouble breathing. You feel confused. Summary CPAP and BIPAP are methods that use air pressure to keep your airways open and to help you breathe well. If you have trouble with the mask, it is very important that you talk with your health care provider about finding a way to make the mask easier to tolerate. Do not stop using the mask. There could be a negative impact to your health if you stop using the mask. Follow instructions from your health care provider about when to use the machine. This information is not intended to replace advice given to you by your health care provider. Make  sure you discuss any questions you have with your health care provider. Document Revised: 01/12/2021 Document Reviewed: 05/14/2020 Elsevier Patient Education  [address] [place].

## 2022-07-26 NOTE — Assessment & Plan Note (Signed)
-   Patient has symptoms of snoring, waking up gasping for air and daytime sleepiness.  Epworth score is 10.  Home sleep study on 07/02/2022 showed evidence of mild obstructive sleep apnea, average AHI 7.1/hour with SpO2 low 68% (average 93%).  Due to daytime sleepiness patient is in agreement to trial CPAP.  We will place DME order for patient to receive auto CPAP 5 to 15 cm H2O with mask of choice.  Advised patient aim to wear CPAP every night for minimum 4 to 6 hours or longer.  Encouraged weight loss efforts.  She is unable to sleep on her side or add an incline due to previous knee and back issues. FU 31-90 days for CPAP compliance check.

## 2022-07-26 NOTE — Assessment & Plan Note (Signed)
-   Stable; No acute symptoms  - PFTs testing 03/31/22 was normal

## 2022-07-26 NOTE — Progress Notes (Signed)
$'@Patient'p$  ID: Sharon [name], female    DOB: Nov 01, 1965, 57 y.o.   MRN: [id number]  Chief Complaint  Patient presents with   Follow-up    Discuss HST. Snoring and sleep apnea persistent.    Referring provider: Eugenia Pancoast, FNP  HPI:  57 year old female, never smoked.  Past medical history significant for hypertension, reactive airway disease, GERD, endometriosis, iron deficiency, insomnia.  PSG 05/12/2015 showed no evidence of obstructive sleep apnea, AHI 0 with SPO2 low 92%. Weight 149lbs.   03/17/2022 Patient presents today for sleep consult. She had sleep study in 2016 that was negative for sleep apnea. She continues to have snoring symptoms and waking up at night feeling short of breath. Her weight is up 20-30 lbs since last study. Typical bedtime is between 10pm-12am. It can take her 30-60 mins to fall asleep. She wakes up several times a night. She starts her day between 6:30am and 10am. Denies symptoms of narcolepsy, cataplexy or sleep walking.   In addition to poor sleep she has symptoms of shortness of breath during the daytime, nocturnal chest tightness, np cough and indigestion. She has never smoked. No PFTs on file. She reports symptoms of reflux and throat irritation/inflammation. She was not able to take famotidine at night but does take '20mg'$  in the morning.   Sleep questionnaire Symptoms-  cant breath/waking up at night, inflammation in throat  Prior sleep study- 2016 Bedtime- 10pm-12am Time to fall asleep- 30-56mn  Nocturnal awakenings- 3-5 times Out of bed/start of day- 6:30am-10am Weight changes- 30 lbs Do you operate heavy machinery- No Do you currently wear CPAP- No Do you current wear oxygen- No Epworth- 10   07/26/2022 - Interim hx  Patient presents today to review sleep study results.  Patient was seen for sleep consult September.  She reported having symptoms of snoring and waking up at night short of breath. She had home sleep study on 07/02/2022 that  showed evidence of mild obstructive sleep apnea, AHI 7.1 an hour with SpO2 low 68% (average 93%).   She has a hard time getting out of bed in the morning d.t fatigue. She feels exhausted during the day. She does not fall asleep driving. It is hard for her to sleep on her side, she has been sleeping on her back since having knee surgery. She just received disability.    Allergies  Allergen Reactions   Bee Venom Anaphylaxis and Other (See Comments)   Penicillins Other (See Comments) and Swelling    Reaction unknown from childhood Has patient had a PCN reaction causing immediate rash, facial/tongue/throat swelling, SOB or lightheadedness with hypotension: unknown Has patient had a PCN reaction causing severe rash involving mucus membranes or skin necrosis: unknown  Has patient had a PCN reaction that required hospitalization: no Has patient had a PCN reaction occurring within the last 10 years: no If all of the above answers are "NO", then may proceed with Cephalosporin use.  Other reaction(s): Other, Unknown Throat swells Throat swells Reaction unknown from childhood Has patient had a PCN reaction causing immediate rash, facial/tongue/throat swelling, SOB or lightheadedness with hypotension: unknown Has patient had a PCN reaction causing severe rash involving mucus membranes or skin necrosis: unknown  Has patient had a PCN reaction that required hospitalization: no Has patient had a PCN reaction occurring within the last 10 years: no If all of the above answers are "NO", then may proceed with Cephalosporin use.  Other reaction(s): Not available   Dextromethorphan  Other reaction(s): Lump in throat Other reaction(s): Lump in throat   Penicillin G     Other reaction(s): Unknown   Dextromethorphan Polistirex Er Other (See Comments)    Pt states caused "a lump" in her throat, making it difficult to swallow   Fluconazole     Primidone interaction Other reaction(s): Primidone  interaction Other reaction(s): Primidone interaction   Hydrocodone Nausea And Vomiting and Nausea Only    Other reaction(s): nausea and vomiting Other reaction(s): nausea and vomiting   Oxycodone-Acetaminophen Other (See Comments)    Made fingers tingle and go numb, started "feeling weird".  Other reaction(s): Other Made fingers tingle and go numb, started "feeling weird".  Other reaction(s): tingle/numb fingers Other reaction(s): tingle/numb fingers   Tape Itching and Other (See Comments)    Bandaids - leaves red marks Other reaction(s): itching Other reaction(s): itching    Immunization History  Administered Date(s) Administered   Influenza Whole 03/19/2010   Influenza,inj,Quad PF,6+ Mos 03/25/2015, 04/13/2021   Influenza-Unspecified 03/19/2018   Moderna Sars-Covid-2 Vaccination 06/21/2019, 07/19/2019, 08/06/2020   Tdap 11/08/2016    Past Medical History:  Diagnosis Date   Allergy    Anal fissure    Anemia    borderline   Anxiety    Arthritis    Blood clot in vein    left leg   Detached retina    BOTH SCLERA BUCKLE BOTH EYES   DVT (deep venous thrombosis) (HCC)    left leg   Endometriosis    Family history of adverse reaction to anesthesia    DAUGHTER POST OP PONV   GERD (gastroesophageal reflux disease)    Glaucoma    RIGHT WORST THAN LEFT   Heart murmur    "caused by anxiety"    History of hemorrhoids    History of kidney stones    Hypertension    IBS (irritable bowel syndrome)    Perforated eardrum, right    SMALL HOLE   PONV (postoperative nausea and vomiting)    Pulmonary embolism (HCC) 6 YRS AGO   Status post arthroscopy of left knee 08/06/2017    Tobacco History: Social History   Tobacco Use  Smoking Status Never  Smokeless Tobacco Never   Counseling given: Not Answered   Outpatient Medications Prior to Visit  Medication Sig Dispense Refill   Acetylcysteine (NAC) 600 MG CAPS Take 600 mg by mouth 2 (two) times daily.     acyclovir  (ZOVIRAX) 400 MG tablet Take 1 tablet by mouth daily.     albuterol (VENTOLIN HFA) 108 (90 Base) MCG/ACT inhaler INHALE 1-2 PUFFS BY MOUTH EVERY 6 HOURS AS NEEDED FOR WHEEZE OR SHORTNESS OF BREATH 8.5 each 2   amitriptyline (ELAVIL) 10 MG tablet TAKE 1 TABLET BY MOUTH EVERYDAY AT BEDTIME 90 tablet 1   bimatoprost (LUMIGAN) 0.03 % ophthalmic solution Place 1 drop into both eyes at bedtime.     Cholecalciferol (VITAMIN D) 50 MCG (2000 UT) tablet 1 tablet     clobetasol (TEMOVATE) 0.05 % external solution Apply 1 Application topically 2 (two) times daily. 50 mL 0   clonazePAM (KLONOPIN) 0.5 MG tablet Take 1 tablet (0.5 mg total) by mouth at bedtime. 30 tablet 5   diclofenac Sodium (VOLTAREN) 1 % GEL      EPINEPHrine 0.3 mg/0.3 mL IJ SOAJ injection Inject 0.3 mg into the muscle as needed for anaphylaxis. 1 each 0   famotidine (PEPCID) 40 MG tablet TAKE 1 TABLET BY MOUTH EVERY DAY 90 tablet 1  gabapentin (NEURONTIN) 600 MG tablet Take 1 tablet by mouth 3 (three) times daily.     lidocaine (LIDODERM) 5 % Apply 1 patch topically daily.     LUMIGAN 0.01 % SOLN 1 drop at bedtime.     meloxicam (MOBIC) 15 MG tablet TAKE 1 TABLET (15 MG TOTAL) BY MOUTH DAILY. 30 tablet 2   methocarbamol (ROBAXIN) 500 MG tablet Take 1 tablet by mouth 4 (four) times daily.     mometasone (NASONEX) 50 MCG/ACT nasal spray Place 2 sprays into the nose daily. 1 each 12   nebivolol (BYSTOLIC) 5 MG tablet TAKE 1 TABLET BY MOUTH EVERY DAY 90 tablet 3   Nitroglycerin (RECTIV) 0.4 % OINT Place 0.4 inches rectally 2 (two) times daily as needed (For anal fissures.). 30 g 1   ondansetron (ZOFRAN-ODT) 4 MG disintegrating tablet 1 tablet on the tongue and allow to dissolve Orally Once a day for 30 day(s)     pantoprazole (PROTONIX) 40 MG tablet Take 1 tablet (40 mg total) by mouth 2 (two) times daily before a meal. 180 tablet 1   polyethylene glycol powder (GLYCOLAX/MIRALAX) 17 GM/SCOOP powder See admin instructions.     primidone  (MYSOLINE) 50 MG tablet 1 tablet Orally Once a day for 30 day(s)     vitamin B-12 (CYANOCOBALAMIN) 1000 MCG tablet Take 1 tablet (1,000 mcg total) by mouth daily. 30 tablet 3   Zinc 50 MG TABS 1 tablet Orally Once a day for 30 day(s)     No facility-administered medications prior to visit.    Review of Systems  Review of Systems  Constitutional:  Positive for fatigue.  HENT: Negative.    Respiratory:  Positive for apnea.   Cardiovascular: Negative.   Psychiatric/Behavioral:  Positive for sleep disturbance.    Physical Exam  BP 126/80 (BP Location: Right Arm, Patient Position: Sitting, Cuff Size: Normal)   Pulse 72   Temp 98.4 F (36.9 C) (Oral)   Ht '5\' 7"'$  (1.702 m)   Wt 167 lb (75.8 kg)   LMP 10/11/2010   SpO2 100%   BMI 26.16 kg/m  Physical Exam Constitutional:      General: She is not in acute distress.    Appearance: Normal appearance. She is not ill-appearing.  HENT:     Head: Normocephalic and atraumatic.     Mouth/Throat:     Mouth: Mucous membranes are moist.     Pharynx: Oropharynx is clear.  Cardiovascular:     Rate and Rhythm: Normal rate and regular rhythm.  Pulmonary:     Effort: Pulmonary effort is normal.     Breath sounds: Normal breath sounds. No wheezing, rhonchi or rales.  Musculoskeletal:        General: Normal range of motion.  Skin:    General: Skin is warm and dry.  Neurological:     General: No focal deficit present.     Mental Status: She is alert and oriented to person, place, and time. Mental status is at baseline.  Psychiatric:        Mood and Affect: Mood normal.        Behavior: Behavior normal.        Thought Content: Thought content normal.        Judgment: Judgment normal.      Lab Results:  CBC    Component Value Date/Time   WBC 6.9 03/28/2022 1440   RBC 4.52 03/28/2022 1440   HGB 13.3 03/28/2022 1440   HGB 12.0 01/20/2019 1649  HCT 40.0 03/28/2022 1440   HCT 35.5 01/20/2019 1649   PLT 285.0 03/28/2022 1440   PLT  293 01/20/2019 1649   MCV 88.5 03/28/2022 1440   MCV 84.7 06/09/2019 1451   MCV 87 01/20/2019 1649   MCH 30.9 11/08/2019 0737   MCHC 33.3 03/28/2022 1440   RDW 13.5 03/28/2022 1440   RDW 12.6 01/20/2019 1649   LYMPHSABS 1.6 06/07/2020 1625   LYMPHSABS 1.7 07/19/2018 1321   MONOABS 0.5 06/07/2020 1625   EOSABS 0.1 06/07/2020 1625   EOSABS 0.2 07/19/2018 1321   BASOSABS 0.1 06/07/2020 1625   BASOSABS 0.0 07/19/2018 1321    BMET    Component Value Date/Time   NA 143 07/11/2022 1345   K 4.5 07/11/2022 1345   CL 103 07/11/2022 1345   CO2 26 07/11/2022 1345   GLUCOSE 74 07/11/2022 1345   GLUCOSE 75 03/28/2022 1440   BUN 13 07/11/2022 1345   CREATININE 0.83 07/11/2022 1345   CREATININE 0.73 10/21/2015 1919   CALCIUM 9.7 07/11/2022 1345   GFRNONAA >60 11/08/2019 0737   GFRAA >60 11/08/2019 0737    BNP No results found for: "BNP"  ProBNP No results found for: "PROBNP"  Imaging: No results found.   Assessment & Plan:   Mild sleep apnea - Patient has symptoms of snoring, waking up gasping for air and daytime sleepiness.  Epworth score is 10.  Home sleep study on 07/02/2022 showed evidence of mild obstructive sleep apnea, average AHI 7.1/hour with SpO2 low 68% (average 93%).  Due to daytime sleepiness patient is in agreement to trial CPAP.  We will place DME order for patient to receive auto CPAP 5 to 15 cm H2O with mask of choice.  Advised patient aim to wear CPAP every night for minimum 4 to 6 hours or longer.  Encouraged weight loss efforts.  She is unable to sleep on her side or add an incline due to previous knee and back issues. FU 31-90 days for CPAP compliance check.   Reflux laryngitis - Following with GI, planning for upper endoscopy  Reactive airway disease - Stable; No acute symptoms  - PFTs testing 03/31/22 was normal    Martyn Ehrich, NP 07/26/2022

## 2022-07-27 ENCOUNTER — Other Ambulatory Visit: Payer: Self-pay | Admitting: Physical Medicine and Rehabilitation

## 2022-07-27 DIAGNOSIS — Z789 Other specified health status: Secondary | ICD-10-CM

## 2022-07-27 NOTE — Progress Notes (Signed)
Reviewed and agree with assessment/plan.   Chesley Mires, MD [facility] Pulmonary/Critical Care 07/27/2022, 8:21 AM Pager:  [phone]

## 2022-07-29 ENCOUNTER — Encounter: Payer: Self-pay | Admitting: Gastroenterology

## 2022-07-29 ENCOUNTER — Other Ambulatory Visit: Payer: Self-pay | Admitting: Family

## 2022-07-29 DIAGNOSIS — L409 Psoriasis, unspecified: Secondary | ICD-10-CM

## 2022-08-15 ENCOUNTER — Encounter: Payer: Self-pay | Admitting: Gastroenterology

## 2022-08-16 ENCOUNTER — Encounter
Admission: RE | Disposition: A | Discharge status: Home / Self Care | Payer: Self-pay | Source: Home / Self Care | Attending: Gastroenterology

## 2022-08-16 ENCOUNTER — Encounter: Payer: Self-pay | Admitting: Gastroenterology

## 2022-08-16 ENCOUNTER — Ambulatory Visit
Admission: RE | Admit: 2022-08-16 | Discharge: 2022-08-16 | Disposition: A | Discharge status: Home / Self Care | Payer: Medicare Other | Attending: Gastroenterology | Admitting: Gastroenterology

## 2022-08-16 ENCOUNTER — Ambulatory Visit: Payer: Medicare Other | Admitting: Certified Registered"

## 2022-08-16 ENCOUNTER — Other Ambulatory Visit: Payer: Self-pay

## 2022-08-16 DIAGNOSIS — J45909 Unspecified asthma, uncomplicated: Secondary | ICD-10-CM | POA: Insufficient documentation

## 2022-08-16 DIAGNOSIS — G4730 Sleep apnea, unspecified: Secondary | ICD-10-CM | POA: Insufficient documentation

## 2022-08-16 DIAGNOSIS — K219 Gastro-esophageal reflux disease without esophagitis: Secondary | ICD-10-CM

## 2022-08-16 DIAGNOSIS — Z86711 Personal history of pulmonary embolism: Secondary | ICD-10-CM | POA: Diagnosis not present

## 2022-08-16 DIAGNOSIS — Z09 Encounter for follow-up examination after completed treatment for conditions other than malignant neoplasm: Secondary | ICD-10-CM | POA: Insufficient documentation

## 2022-08-16 DIAGNOSIS — I10 Essential (primary) hypertension: Secondary | ICD-10-CM | POA: Diagnosis not present

## 2022-08-16 HISTORY — PX: ESOPHAGOGASTRODUODENOSCOPY (EGD) WITH PROPOFOL: SHX5813

## 2022-08-16 SURGERY — ESOPHAGOGASTRODUODENOSCOPY (EGD) WITH PROPOFOL
Anesthesia: General

## 2022-08-16 MED ORDER — GLYCOPYRROLATE 0.2 MG/ML IJ SOLN
INTRAMUSCULAR | Status: DC | PRN
  Administered 2022-08-16: .2 mg via INTRAVENOUS

## 2022-08-16 MED ORDER — SODIUM CHLORIDE 0.9 % IV SOLN: INTRAVENOUS | Status: DC

## 2022-08-16 MED ORDER — LIDOCAINE HCL (CARDIAC) PF 100 MG/5ML IV SOSY
PREFILLED_SYRINGE | INTRAVENOUS | Status: DC | PRN
  Administered 2022-08-16: 80 mg via INTRAVENOUS

## 2022-08-16 MED ORDER — ONDANSETRON HCL 4 MG/2ML IJ SOLN
INTRAMUSCULAR | Status: DC | PRN
  Administered 2022-08-16: 4 mg via INTRAVENOUS

## 2022-08-16 MED ORDER — PROPOFOL 500 MG/50ML IV EMUL
INTRAVENOUS | Status: DC | PRN
  Administered 2022-08-16: 50 mg via INTRAVENOUS
  Administered 2022-08-16: 120 ug/kg/min via INTRAVENOUS
  Administered 2022-08-16: 50 mg via INTRAVENOUS

## 2022-08-16 NOTE — Anesthesia Postprocedure Evaluation (Signed)
Anesthesia Post Note  Patient: Sharon [name]  Procedure(s) Performed: ESOPHAGOGASTRODUODENOSCOPY (EGD) WITH PROPOFOL  Patient location during evaluation: Endoscopy Anesthesia Type: General Level of consciousness: awake and alert Pain management: pain level controlled Vital Signs Assessment: post-procedure vital signs reviewed and stable Respiratory status: spontaneous breathing, nonlabored ventilation, respiratory function stable and patient connected to nasal cannula oxygen Cardiovascular status: blood pressure returned to baseline and stable Postop Assessment: no apparent nausea or vomiting Anesthetic complications: no  No notable events documented.   Last Vitals:  Vitals:   08/16/22 0740 08/16/22 0824  BP: 129/79 98/66  Pulse: 65 69  Resp: 18 14  Temp: (!) 36.2 C (!) 36.4 C  SpO2: 100% 100%    Last Pain:  Vitals:   08/16/22 0834  TempSrc:   PainSc: 0-No pain                 Dimas Millin

## 2022-08-16 NOTE — Anesthesia Preprocedure Evaluation (Signed)
Anesthesia Evaluation  Patient identified by MRN, date of birth, ID band Patient awake    Reviewed: Allergy & Precautions, NPO status , Patient's Chart, lab work & pertinent test results, reviewed documented beta blocker date and time   History of Anesthesia Complications (+) PONV and history of anesthetic complications  Airway Mallampati: II  TM Distance: >3 FB Neck ROM: Full    Dental  (+) Dental Advisory Given   Pulmonary asthma , sleep apnea and Continuous Positive Airway Pressure Ventilation , neg recent URI, PE   breath sounds clear to auscultation       Cardiovascular hypertension, Pt. on medications and Pt. on home beta blockers (-) Past MI  Rhythm:Regular     Neuro/Psych  PSYCHIATRIC DISORDERS Anxiety     negative neurological ROS     GI/Hepatic Neg liver ROS,GERD  Medicated and Controlled,,  Endo/Other  negative endocrine ROS    Renal/GU negative Renal ROS     Musculoskeletal  (+) Arthritis ,    Abdominal   Peds  Hematology negative hematology ROS (+) Denies blood thinners   Anesthesia Other Findings - Left ventricle: The cavity size was normal. Wall thickness  was normal. Systolic function was normal. The estimated  ejection fraction was in the range of 55% to 60%. Wall  motion was normal; there were no regional wall motion  abnormalities. Doppler parameters are consistent with  abnormal left ventricular relaxation (grade 1 diastolic  dysfunction).   Reproductive/Obstetrics                             Anesthesia Physical Anesthesia Plan  ASA: 2  Anesthesia Plan: General   Post-op Pain Management:  Regional for Post-op pain and Minimal or no pain anticipated   Induction: Intravenous  PONV Risk Score and Plan: 4 or greater and Propofol infusion and TIVA  Airway Management Planned: Nasal Cannula  Additional Equipment: None  Intra-op Plan:    Post-operative Plan:   Informed Consent: I have reviewed the patients History and Physical, chart, labs and discussed the procedure including the risks, benefits and alternatives for the proposed anesthesia with the patient or authorized representative who has indicated his/her understanding and acceptance.     Dental advisory given  Plan Discussed with: CRNA and Surgeon  Anesthesia Plan Comments: (Discussed risks of anesthesia with patient, including possibility of difficulty with spontaneous ventilation under anesthesia necessitating airway intervention, PONV, and rare risks such as cardiac or respiratory or neurological events, and allergic reactions. Discussed the role of CRNA in patient's perioperative care. Patient understands.)        Anesthesia Quick Evaluation

## 2022-08-16 NOTE — Op Note (Signed)
[facility] Patient Name: Sharon [name] Procedure Date: 08/16/2022 7:46 AM MRN: [id number] Account #: [id number] Date of Birth: 08-28-1965 Admit Type: Outpatient Age: 57 Room: [facility] ENDO ROOM 4 Gender: Female Note Status: Finalized Instrument Name: Upper Endoscope [id number] Procedure:             Upper GI endoscopy Indications:           Follow-up of gastro-esophageal reflux disease Providers:             Lin Landsman MD, MD Medicines:             General Anesthesia Complications:         No immediate complications. Estimated blood loss: None. Procedure:             Pre-Anesthesia Assessment:                        - Prior to the procedure, a History and Physical was                         performed, and patient medications and allergies were                         reviewed. The patient is competent. The risks and                         benefits of the procedure and the sedation options and                         risks were discussed with the patient. All questions                         were answered and informed consent was obtained.                         Patient identification and proposed procedure were                         verified by the physician, the nurse, the                         anesthesiologist, the anesthetist and the technician                         in the pre-procedure area in the procedure room in the                         endoscopy suite. Mental Status Examination: alert and                         oriented. Airway Examination: normal oropharyngeal                         airway and neck mobility. Respiratory Examination:                         clear to auscultation. CV Examination: normal.  Prophylactic Antibiotics: The patient does not require                         prophylactic antibiotics. Prior Anticoagulants: The                         patient has taken no anticoagulant or  antiplatelet                         agents. ASA Grade Assessment: II - A patient with mild                         systemic disease. After reviewing the risks and                         benefits, the patient was deemed in satisfactory                         condition to undergo the procedure. The anesthesia                         plan was to use general anesthesia. Immediately prior                         to administration of medications, the patient was                         re-assessed for adequacy to receive sedatives. The                         heart rate, respiratory rate, oxygen saturations,                         blood pressure, adequacy of pulmonary ventilation, and                         response to care were monitored throughout the                         procedure. The physical status of the patient was                         re-assessed after the procedure.                        After obtaining informed consent, the endoscope was                         passed under direct vision. Throughout the procedure,                         the patient's blood pressure, pulse, and oxygen                         saturations were monitored continuously. The Endoscope                         was introduced through the mouth, and advanced to the  second part of duodenum. The upper GI endoscopy was                         accomplished without difficulty. The patient tolerated                         the procedure well. Findings:      The examined duodenum was normal.      The entire examined stomach was normal.      The cardia and gastric fundus were normal on retroflexion.      Esophagogastric landmarks were identified: the gastroesophageal junction       was found at 38 cm from the incisors.      The gastroesophageal junction and examined esophagus were normal.       Biopsies were taken with a cold forceps for histology. Impression:            - Normal  examined duodenum.                        - Normal stomach.                        - Esophagogastric landmarks identified.                        - Normal gastroesophageal junction and esophagus.                         Biopsied. Recommendation:        - Await pathology results.                        - Discharge patient to home (with escort).                        - Resume previous diet today.                        - Continue present medications. Procedure Code(s):     --- Professional ---                        [phone], Esophagogastroduodenoscopy, flexible,                         transoral; with biopsy, single or multiple Diagnosis Code(s):     --- Professional ---                        K21.9, Gastro-esophageal reflux disease without                         esophagitis CPT copyright 2022 American [employer] Association. All rights reserved. The codes documented in this report are preliminary and upon coder review may  be revised to meet current compliance requirements. Dr. Ulyess Mort Lin Landsman MD, MD 08/16/2022 8:18:54 AM This report has been signed electronically. Number of Addenda: 0 Note Initiated On: 08/16/2022 7:46 AM Estimated Blood Loss:  Estimated blood loss: none.      [facility]

## 2022-08-16 NOTE — Transfer of Care (Signed)
Immediate Anesthesia Transfer of Care Note  Patient: Sharon [name]  Procedure(s) Performed: ESOPHAGOGASTRODUODENOSCOPY (EGD) WITH PROPOFOL  Patient Location: PACU  Anesthesia Type:MAC  Level of Consciousness: awake  Airway & Oxygen Therapy: Patient Spontanous Breathing and Patient connected to face mask oxygen  Post-op Assessment: Report given to RN and Post -op Vital signs reviewed and stable  Post vital signs: Reviewed  Last Vitals:  Vitals Value Taken Time  BP 98/66 08/16/22 0825  Temp 36.4 C 08/16/22 0824  Pulse 69 08/16/22 0826  Resp 19 08/16/22 0826  SpO2 99 % 08/16/22 0826  Vitals shown include unvalidated device data.  Last Pain:  Vitals:   08/16/22 0824  TempSrc: Temporal  PainSc: 0-No pain         Complications: No notable events documented.

## 2022-08-16 NOTE — H&P (Signed)
Cephas Darby, MD [address]  [place], [place] [address]  Main: [phone]  Fax: [phone] Pager: [phone]  Primary Care Physician:  Eugenia Pancoast, FNP Primary Gastroenterologist:  Dr. Cephas Darby  Pre-Procedure History & Physical: HPI:  Sharon [name] is a 57 y.o. female is here for an endoscopy.   Past Medical History:  Diagnosis Date   Allergy    Anal fissure    Anemia    borderline   Anxiety    Arthritis    Blood clot in vein    left leg   Detached retina    BOTH SCLERA BUCKLE BOTH EYES   DVT (deep venous thrombosis) (HCC)    left leg   Endometriosis    Family history of adverse reaction to anesthesia    DAUGHTER POST OP PONV   GERD (gastroesophageal reflux disease)    Glaucoma    RIGHT WORST THAN LEFT   Heart murmur    "caused by anxiety"    History of hemorrhoids    History of kidney stones    Hypertension    IBS (irritable bowel syndrome)    Perforated eardrum, right    SMALL HOLE   PONV (postoperative nausea and vomiting)    Pulmonary embolism (HCC) 6 YRS AGO   Status post arthroscopy of left knee 08/06/2017    Past Surgical History:  Procedure Laterality Date   ABDOMINAL HYSTERECTOMY     still with both ovaries   CATARACT EXTRACTION Bilateral 2015   COLONOSCOPY     CYSTOSCOPY W/ URETERAL STENT PLACEMENT Right 05/23/2017   Procedure: CYSTOSCOPY WITH RETROGRADE PYELOGRAM/URETERAL RIGHT STENT PLACEMENT;  Surgeon: Bjorn Loser, MD;  Location: [facility];  Service: Urology;  Laterality: Right;   CYSTOSCOPY W/ URETERAL STENT PLACEMENT Right 06/04/2017   Procedure: CYSTOSCOPY WITHRIGHT RETROGRADE PYELOGRAMRIGHT /URETEREOSCOPY  STENT PLACEMENT;  Surgeon: Lucas Mallow, MD;  Location: [facility];  Service: Urology;  Laterality: Right;   ENDOMETRIAL ABLATION     EYE SURGERY     HEMORRHOID SURGERY  2011   INCONTINENCE SURGERY     INNER EAR SURGERY     X 2   RECTAL PROLAPSE REPAIR     RETINAL  DETACHMENT SURGERY Bilateral 2000   TOTAL KNEE ARTHROPLASTY Left 04/29/2019   Procedure: LEFT TOTAL KNEE ARTHROPLASTY;  Surgeon: Mcarthur Rossetti, MD;  Location: [facility];  Service: Orthopedics;  Laterality: Left;   TUBAL LIGATION     VEIN SURGERY Left 04/2010    Prior to Admission medications   Medication Sig Start Date End Date Taking? Authorizing Provider  nebivolol (BYSTOLIC) 5 MG tablet TAKE 1 TABLET BY MOUTH EVERY DAY 03/30/22  Yes Dugal, Tabitha, FNP  Acetylcysteine (NAC) 600 MG CAPS Take 600 mg by mouth 2 (two) times daily.    [provider]  acyclovir (ZOVIRAX) 400 MG tablet Take 1 tablet by mouth daily.    [provider]  albuterol (VENTOLIN HFA) 108 (90 Base) MCG/ACT inhaler INHALE 1-2 PUFFS BY MOUTH EVERY 6 HOURS AS NEEDED FOR WHEEZE OR SHORTNESS OF BREATH 07/05/21   Waunita Schooner, MD  amitriptyline (ELAVIL) 10 MG tablet TAKE 1 TABLET BY MOUTH EVERYDAY AT BEDTIME 07/06/22   Raulkar, Clide Deutscher, MD  bimatoprost (LUMIGAN) 0.03 % ophthalmic solution Place 1 drop into both eyes at bedtime.    [provider]  Cholecalciferol (VITAMIN D) 50 MCG (2000 UT) tablet 1 tablet    [provider]  clobetasol (TEMOVATE) 0.05 %  external solution APPLY TO AFFECTED AREA TWICE A DAY 07/31/22   Eugenia Pancoast, FNP  clonazePAM (KLONOPIN) 0.5 MG tablet Take 1 tablet (0.5 mg total) by mouth at bedtime. 04/11/22   Eugenia Pancoast, FNP  diclofenac Sodium (VOLTAREN) 1 % GEL     [provider]  EPINEPHrine 0.3 mg/0.3 mL IJ SOAJ injection Inject 0.3 mg into the muscle as needed for anaphylaxis. 03/07/22   Eugenia Pancoast, FNP  famotidine (PEPCID) 40 MG tablet TAKE 1 TABLET BY MOUTH EVERY DAY 05/08/22   Martyn Ehrich, NP  gabapentin (NEURONTIN) 600 MG tablet Take 1 tablet by mouth 3 (three) times daily.    [provider]  lidocaine (LIDODERM) 5 % Apply 1 patch topically daily.    [provider]  LUMIGAN 0.01 % SOLN 1 drop at bedtime. 01/19/22    [provider]  meloxicam (MOBIC) 15 MG tablet TAKE 1 TABLET (15 MG TOTAL) BY MOUTH DAILY. 07/06/22   Raulkar, Clide Deutscher, MD  methocarbamol (ROBAXIN) 500 MG tablet Take 1 tablet by mouth 4 (four) times daily.    [provider]  mometasone (NASONEX) 50 MCG/ACT nasal spray Place 2 sprays into the nose daily. 05/10/22   Clemon Chambers, MD  Nitroglycerin (RECTIV) 0.4 % OINT Place 0.4 inches rectally 2 (two) times daily as needed (For anal fissures.). 01/01/18   Forrest Moron, MD  ondansetron (ZOFRAN-ODT) 4 MG disintegrating tablet 1 tablet on the tongue and allow to dissolve Orally Once a day for 30 day(s)    [provider]  pantoprazole (PROTONIX) 40 MG tablet Take 1 tablet (40 mg total) by mouth 2 (two) times daily before a meal. 05/10/22   Clemon Chambers, MD  polyethylene glycol powder (GLYCOLAX/MIRALAX) 17 GM/SCOOP powder See admin instructions.    [provider]  primidone (MYSOLINE) 50 MG tablet 1 tablet Orally Once a day for 30 day(s)    [provider]  vitamin B-12 (CYANOCOBALAMIN) 1000 MCG tablet Take 1 tablet (1,000 mcg total) by mouth daily. 01/20/19   Forrest Moron, MD  Zinc 50 MG TABS 1 tablet Orally Once a day for 30 day(s)    [provider]  primidone (MYSOLINE) 50 MG tablet TAKE 1 TABLET IN MORNING AND 3 TABLETS AT BEDTIME 11/16/20   Pieter Partridge, DO    Allergies as of 07/25/2022 - Review Complete 07/25/2022  Allergen Reaction Noted   Bee venom Anaphylaxis and Other (See Comments) 09/10/2019   Penicillins Other (See Comments) and Swelling 10/11/2010   Dextromethorphan  03/22/2021   Penicillin g  03/27/2022   Dextromethorphan polistirex er Other (See Comments) 11/08/2010   Fluconazole  08/02/2018   Hydrocodone Nausea And Vomiting and Nausea Only 02/01/2012   Oxycodone-acetaminophen Other (See Comments) 05/23/2017   Tape Itching and Other (See Comments) 08/17/2017    Family History  Problem Relation Age of Onset    Emphysema Mother        smoker   Allergies Mother    Asthma Mother    Heart disease Mother        AMI as cause of death   COPD Mother    Asthma Father    Heart disease Father        AMI as cause of death   Diabetes Father    Asthma Brother    Heart disease Brother 16       heart failure   Lung cancer Maternal Grandfather        was  a smoker   Cancer Maternal Grandfather        lung   Heart disease Paternal Grandmother        AMI   Heart disease Paternal Grandfather        AMI   Colon cancer Neg Hx     Social History   Socioeconomic History   Marital status: Divorced    Spouse name: Not on file   Number of children: 1   Years of education: Not on file   Highest education level: Not on file  Occupational History   Occupation: Restaurant manager, fast food: [employer]  Tobacco Use   Smoking status: Never   Smokeless tobacco: Never  Vaping Use   Vaping Use: Never used  Substance and Sexual Activity   Alcohol use: Not Currently    Comment: occ   Drug use: No    Comment: former maryjuana use- quit in 1995   Sexual activity: Yes    Partners: Male    Birth control/protection: Surgical  Other Topics Concern   Not on file  Social History Narrative   Marital status: divorced x 2; dating seriously x 3 years.        Children:  1 daughter (36); no grandchildren      Lives:  With boyfriend.        Employment: works at [employer]: none      Alcohol: rarely; socially      Exercise: none; has treadmill and elliptical and bikes   Coffee in am / 1/2 of coke in afternoon    High school education      01/29/20   From: the area   Living: alone - but boyfriend living with her Wilfred Lacy (2020) but knew each other in high school   Work: on disability - working on appeal      Family: one daughter [name] - one grandchild coming in January       Enjoys: relax and watch tv, use to like fishing/boating/rafting, reading, crochet       Exercise:  rehab exercises   Diet: not great - limited intake      Safety   Seat belts: Yes    Guns: Yes  and secure   Right handed   One story home   Drinks caffeine   Social Determinants of Health   Financial Resource Strain: Not on file  Food Insecurity: Not on file  Transportation Needs: Not on file  Physical Activity: Not on file  Stress: Not on file  Social Connections: Not on file  Intimate Partner Violence: Not on file    Review of Systems: See HPI, otherwise negative ROS  Physical Exam: BP 129/79   Pulse 65   Temp (!) 97.2 F (36.2 C) (Temporal)   Resp 18   Ht '5\' 7"'$  (1.702 m)   Wt 73.9 kg   LMP 10/11/2010   SpO2 100%   BMI 25.53 kg/m  General:   Alert,  pleasant and cooperative in NAD Head:  Normocephalic and atraumatic. Neck:  Supple; no masses or thyromegaly. Lungs:  Clear throughout to auscultation.    Heart:  Regular rate and rhythm. Abdomen:  Soft, nontender and nondistended. Normal bowel sounds, without guarding, and without rebound.   Neurologic:  Alert and  oriented x4;  grossly normal neurologically.  Impression/Plan: Sharon [name] is here for an endoscopy to be performed for chronic GERD  Risks, benefits, limitations, and  alternatives regarding  endoscopy have been reviewed with the patient.  Questions have been answered.  All parties agreeable.   Sherri Sear, MD  08/16/2022, 7:46 AM

## 2022-08-17 ENCOUNTER — Encounter: Payer: Self-pay | Admitting: Gastroenterology

## 2022-08-17 LAB — SURGICAL PATHOLOGY

## 2022-08-17 NOTE — Progress Notes (Signed)
noted 

## 2022-08-25 ENCOUNTER — Ambulatory Visit (INDEPENDENT_AMBULATORY_CARE_PROVIDER_SITE_OTHER): Payer: Medicare Other | Admitting: Family

## 2022-08-25 ENCOUNTER — Other Ambulatory Visit: Payer: Self-pay | Admitting: Family

## 2022-08-25 ENCOUNTER — Encounter: Payer: Self-pay | Admitting: Family

## 2022-08-25 ENCOUNTER — Ambulatory Visit
Admission: RE | Admit: 2022-08-25 | Discharge: 2022-08-25 | Disposition: A | Discharge status: Home / Self Care | Payer: Medicare Other | Source: Ambulatory Visit | Attending: Family | Admitting: Family

## 2022-08-25 VITALS — BP 130/80 | HR 80 | Temp 97.7°F | Ht 67.0 in | Wt 164.6 lb

## 2022-08-25 DIAGNOSIS — R109 Unspecified abdominal pain: Secondary | ICD-10-CM | POA: Insufficient documentation

## 2022-08-25 DIAGNOSIS — N3001 Acute cystitis with hematuria: Secondary | ICD-10-CM

## 2022-08-25 DIAGNOSIS — R319 Hematuria, unspecified: Secondary | ICD-10-CM | POA: Diagnosis present

## 2022-08-25 DIAGNOSIS — R300 Dysuria: Secondary | ICD-10-CM

## 2022-08-25 DIAGNOSIS — R112 Nausea with vomiting, unspecified: Secondary | ICD-10-CM | POA: Insufficient documentation

## 2022-08-25 DIAGNOSIS — Z9889 Other specified postprocedural states: Secondary | ICD-10-CM | POA: Insufficient documentation

## 2022-08-25 LAB — COMPREHENSIVE METABOLIC PANEL
ALT: 19 U/L (ref 0–35)
AST: 15 U/L (ref 0–37)
Albumin: 4.3 g/dL (ref 3.5–5.2)
Alkaline Phosphatase: 83 U/L (ref 39–117)
BUN: 15 mg/dL (ref 6–23)
CO2: 31 mEq/L (ref 19–32)
Calcium: 9.9 mg/dL (ref 8.4–10.5)
Chloride: 104 mEq/L (ref 96–112)
Creatinine, Ser: 0.75 mg/dL (ref 0.40–1.20)
GFR: 88.03 mL/min (ref >60.00)
Glucose, Bld: 97 mg/dL (ref 70–99)
Potassium: 4.6 mEq/L (ref 3.5–5.1)
Sodium: 140 mEq/L (ref 135–145)
Total Bilirubin: 0.4 mg/dL (ref 0.2–1.2)
Total Protein: 7.1 g/dL (ref 6.0–8.3)

## 2022-08-25 LAB — POCT URINALYSIS DIP (CLINITEK)
Bilirubin, UA: NEGATIVE
Glucose, UA: NEGATIVE mg/dL
Ketones, POC UA: NEGATIVE mg/dL
Leukocytes, UA: NEGATIVE
Nitrite, UA: NEGATIVE
POC PROTEIN,UA: NEGATIVE
Spec Grav, UA: 1.01 (ref 1.010–1.025)
Urobilinogen, UA: 0.2 E.U./dL
pH, UA: 6 (ref 5.0–8.0)

## 2022-08-25 LAB — URINALYSIS, ROUTINE W REFLEX MICROSCOPIC
Bilirubin Urine: NEGATIVE
Ketones, ur: NEGATIVE
Nitrite: NEGATIVE
Specific Gravity, Urine: <=1.005 — AB (ref 1.000–1.030)
Total Protein, Urine: NEGATIVE
Urine Glucose: NEGATIVE
Urobilinogen, UA: 0.2 (ref 0.0–1.0)
pH: 6 (ref 5.0–8.0)

## 2022-08-25 LAB — CBC
HCT: 38.9 % (ref 36.0–46.0)
Hemoglobin: 13.2 g/dL (ref 12.0–15.0)
MCHC: 34 g/dL (ref 30.0–36.0)
MCV: 85.9 fl (ref 78.0–100.0)
Platelets: 293 10*3/uL (ref 150.0–400.0)
RBC: 4.53 Mil/uL (ref 3.87–5.11)
RDW: 14.1 % (ref 11.5–15.5)
WBC: 5.8 10*3/uL (ref 4.0–10.5)

## 2022-08-25 MED ORDER — CIPROFLOXACIN HCL 500 MG PO TABS: 500.0000 mg | ORAL_TABLET | Freq: Two times a day (BID) | ORAL | 0 refills | Status: AC

## 2022-08-25 NOTE — Progress Notes (Unsigned)
Established Patient Office Visit  Subjective:   Patient ID: Sharon [name], female    DOB: 03-14-1966  Age: 57 y.o. MRN: [id number]  CC:  Chief Complaint  Patient presents with   Dysuria    HPI: Sharon [name] is a 57 y.o. female presenting on 08/25/2022 for Dysuria  Dysuria     Over the last week with worsening dysuria, and also some slight lower abdominal pain.  Does admit to right sided flank pain.  She does admit to increased urinary frequency and also urgency.  No fever or chills.   The right sided back pain is constant and dull and achy but at movements can be sharp and stabbing however also with chronic nerve pain with left lower back pain.             ROS: Negative unless specifically indicated above in HPI.   Relevant past medical history reviewed and updated as indicated.   Allergies and medications reviewed and updated.   Current Outpatient Medications:    Acetylcysteine (NAC) 600 MG CAPS, Take 600 mg by mouth 2 (two) times daily., Disp: , Rfl:    acyclovir (ZOVIRAX) 400 MG tablet, Take 1 tablet by mouth daily., Disp: , Rfl:    albuterol (VENTOLIN HFA) 108 (90 Base) MCG/ACT inhaler, INHALE 1-2 PUFFS BY MOUTH EVERY 6 HOURS AS NEEDED FOR WHEEZE OR SHORTNESS OF BREATH, Disp: 8.5 each, Rfl: 2   amitriptyline (ELAVIL) 10 MG tablet, TAKE 1 TABLET BY MOUTH EVERYDAY AT BEDTIME, Disp: 90 tablet, Rfl: 1   bimatoprost (LUMIGAN) 0.03 % ophthalmic solution, Place 1 drop into both eyes at bedtime., Disp: , Rfl:    Cholecalciferol (VITAMIN D) 50 MCG (2000 UT) tablet, 1 tablet, Disp: , Rfl:    clobetasol (TEMOVATE) 0.05 % external solution, APPLY TO AFFECTED AREA TWICE A DAY, Disp: 50 mL, Rfl: 0   clonazePAM (KLONOPIN) 0.5 MG tablet, Take 1 tablet (0.5 mg total) by mouth at bedtime., Disp: 30 tablet, Rfl: 5   EPINEPHrine 0.3 mg/0.3 mL IJ SOAJ injection, Inject 0.3 mg into the muscle as needed for anaphylaxis., Disp: 1 each, Rfl: 0   famotidine (PEPCID) 40 MG  tablet, TAKE 1 TABLET BY MOUTH EVERY DAY, Disp: 90 tablet, Rfl: 1   gabapentin (NEURONTIN) 600 MG tablet, Take 1 tablet by mouth 3 (three) times daily., Disp: , Rfl:    lidocaine (LIDODERM) 5 %, Apply 1 patch topically daily., Disp: , Rfl:    LUMIGAN 0.01 % SOLN, 1 drop at bedtime., Disp: , Rfl:    meloxicam (MOBIC) 15 MG tablet, TAKE 1 TABLET (15 MG TOTAL) BY MOUTH DAILY., Disp: 30 tablet, Rfl: 2   methocarbamol (ROBAXIN) 500 MG tablet, Take 1 tablet by mouth 4 (four) times daily., Disp: , Rfl:    mometasone (NASONEX) 50 MCG/ACT nasal spray, Place 2 sprays into the nose daily., Disp: 1 each, Rfl: 12   nebivolol (BYSTOLIC) 5 MG tablet, TAKE 1 TABLET BY MOUTH EVERY DAY, Disp: 90 tablet, Rfl: 3   Nitroglycerin (RECTIV) 0.4 % OINT, Place 0.4 inches rectally 2 (two) times daily as needed (For anal fissures.)., Disp: 30 g, Rfl: 1   ondansetron (ZOFRAN-ODT) 4 MG disintegrating tablet, 1 tablet on the tongue and allow to dissolve Orally Once a day for 30 day(s), Disp: , Rfl:    pantoprazole (PROTONIX) 40 MG tablet, Take 1 tablet (40 mg total) by mouth 2 (two) times daily before a meal., Disp: 180 tablet, Rfl: 1   polyethylene glycol powder (  GLYCOLAX/MIRALAX) 17 GM/SCOOP powder, See admin instructions., Disp: , Rfl:    primidone (MYSOLINE) 50 MG tablet, 1 tablet Orally Once a day for 30 day(s), Disp: , Rfl:    vitamin B-12 (CYANOCOBALAMIN) 1000 MCG tablet, Take 1 tablet (1,000 mcg total) by mouth daily., Disp: 30 tablet, Rfl: 3   Zinc 50 MG TABS, 1 tablet Orally Once a day for 30 day(s), Disp: , Rfl:   Allergies  Allergen Reactions   Bee Venom Anaphylaxis and Other (See Comments)   Penicillins Other (See Comments) and Swelling    Reaction unknown from childhood Has patient had a PCN reaction causing immediate rash, facial/tongue/throat swelling, SOB or lightheadedness with hypotension: unknown Has patient had a PCN reaction causing severe rash involving mucus membranes or skin necrosis: unknown  Has  patient had a PCN reaction that required hospitalization: no Has patient had a PCN reaction occurring within the last 10 years: no If all of the above answers are "NO", then may proceed with Cephalosporin use.  Other reaction(s): Other, Unknown Throat swells Throat swells Reaction unknown from childhood Has patient had a PCN reaction causing immediate rash, facial/tongue/throat swelling, SOB or lightheadedness with hypotension: unknown Has patient had a PCN reaction causing severe rash involving mucus membranes or skin necrosis: unknown  Has patient had a PCN reaction that required hospitalization: no Has patient had a PCN reaction occurring within the last 10 years: no If all of the above answers are "NO", then may proceed with Cephalosporin use.  Other reaction(s): Not available   Dextromethorphan     Other reaction(s): Lump in throat Other reaction(s): Lump in throat   Hydrocodone-Acetaminophen Other (See Comments)   Oxycodone Other (See Comments)   Penicillin G     Other reaction(s): Unknown   Dextromethorphan Polistirex Er Other (See Comments)    Pt states caused "a lump" in her throat, making it difficult to swallow   Fluconazole     Primidone interaction Other reaction(s): Primidone interaction Other reaction(s): Primidone interaction   Hydrocodone Nausea And Vomiting and Nausea Only    Other reaction(s): nausea and vomiting Other reaction(s): nausea and vomiting   Oxycodone-Acetaminophen Other (See Comments)    Made fingers tingle and go numb, started "feeling weird".  Other reaction(s): Other Made fingers tingle and go numb, started "feeling weird".  Other reaction(s): tingle/numb fingers Other reaction(s): tingle/numb fingers   Tape Itching and Other (See Comments)    Bandaids - leaves red marks  Other reaction(s): itching    Objective:   BP 130/80   Pulse 80   Temp 97.7 F (36.5 C) (Temporal)   Ht '5\' 7"'$  (1.702 m)   Wt 164 lb 9.6 oz (74.7 kg)   LMP  10/11/2010   SpO2 98%   BMI 25.78 kg/m    Physical Exam Constitutional:      General: She is not in acute distress.    Appearance: Normal appearance. She is normal weight. She is not ill-appearing, toxic-appearing or diaphoretic.  Cardiovascular:     Rate and Rhythm: Normal rate.  Pulmonary:     Effort: Pulmonary effort is normal.  Abdominal:     General: Abdomen is flat.     Tenderness: There is no abdominal tenderness. There is right CVA tenderness (very slight). There is no left CVA tenderness.  Neurological:     General: No focal deficit present.     Mental Status: She is alert and oriented to person, place, and time. Mental status is at baseline.  Motor: No weakness.  Psychiatric:        Mood and Affect: Mood normal.        Behavior: Behavior normal.        Thought Content: Thought content normal.        Judgment: Judgment normal.     Assessment & Plan:  There are no diagnoses linked to this encounter.   Follow up plan: No follow-ups on file.  Eugenia Pancoast, FNP

## 2022-08-28 DIAGNOSIS — R109 Unspecified abdominal pain: Secondary | ICD-10-CM | POA: Insufficient documentation

## 2022-08-28 LAB — URINE CULTURE
MICRO NUMBER:: 14668402
SPECIMEN QUALITY:: ADEQUATE

## 2022-08-28 MED ORDER — SULFAMETHOXAZOLE-TRIMETHOPRIM 800-160 MG PO TABS: 1.0000 | ORAL_TABLET | Freq: Two times a day (BID) | ORAL | 0 refills | Status: AC

## 2022-08-28 NOTE — Assessment & Plan Note (Signed)
Cbc cmp to r/o pyelnephritis, kidney decrease pending results.  CT abd pelvis stat to r/o kidney stone as h/o kidney stone as well as c/o acute flank pain intermittent.  Urine culture and poct urine, positive for hematuria. Pending results of culture.   Advised pt of red flag symptoms and when to seek more urgent care.

## 2022-08-28 NOTE — Telephone Encounter (Signed)
FYI

## 2022-09-04 ENCOUNTER — Other Ambulatory Visit: Payer: Self-pay | Admitting: Physical Medicine and Rehabilitation

## 2022-09-04 ENCOUNTER — Other Ambulatory Visit: Payer: Self-pay | Admitting: Internal Medicine

## 2022-09-04 ENCOUNTER — Other Ambulatory Visit: Payer: Self-pay | Admitting: Family

## 2022-09-04 DIAGNOSIS — K219 Gastro-esophageal reflux disease without esophagitis: Secondary | ICD-10-CM

## 2022-09-04 DIAGNOSIS — L409 Psoriasis, unspecified: Secondary | ICD-10-CM

## 2022-09-12 DIAGNOSIS — H6121 Impacted cerumen, right ear: Secondary | ICD-10-CM | POA: Insufficient documentation

## 2022-09-26 ENCOUNTER — Encounter: Payer: Self-pay | Admitting: Primary Care

## 2022-09-26 ENCOUNTER — Ambulatory Visit (INDEPENDENT_AMBULATORY_CARE_PROVIDER_SITE_OTHER): Payer: Medicare HMO | Admitting: Primary Care

## 2022-09-26 VITALS — BP 122/70 | HR 65 | Ht 67.0 in | Wt 164.0 lb

## 2022-09-26 DIAGNOSIS — G4730 Sleep apnea, unspecified: Secondary | ICD-10-CM

## 2022-09-26 NOTE — Progress Notes (Signed)
@Patient  ID: [name], female    DOB: [birth year], 57 y.o.   MRN: [id number]  Chief Complaint  Patient presents with   Follow-up    Referring provider: Mort Sawyers, FNP  HPI: 57 year old female, never smoked.  Past medical history significant for hypertension, reactive airway disease, GERD, endometriosis, iron deficiency, insomnia.  PSG 05/12/2015 showed no evidence of obstructive sleep apnea, AHI 0 with SPO2 low 92%. Weight 149lbs.   Previous [facility] pulmonary encounter: 03/17/2022 Patient presents today for sleep consult. She had sleep study in 2016 that was negative for sleep apnea. She continues to have snoring symptoms and waking up at night feeling short of breath. Her weight is up 20-30 lbs since last study. Typical bedtime is between 10pm-12am. It can take her 30-60 mins to fall asleep. She wakes up several times a night. She starts her day between 6:30am and 10am. Denies symptoms of narcolepsy, cataplexy or sleep walking.   In addition to poor sleep she has symptoms of shortness of breath during the daytime, nocturnal chest tightness, np cough and indigestion. She has never smoked. No PFTs on file. She reports symptoms of reflux and throat irritation/inflammation. She was not able to take famotidine at night but does take 20mg  in the morning.   Sleep questionnaire Symptoms-  cant breath/waking up at night, inflammation in throat  Prior sleep study- 2016 Bedtime- 10pm-12am Time to fall asleep- 30-52min  Nocturnal awakenings- 3-5 times Out of bed/start of day- 6:30am-10am Weight changes- 30 lbs Do you operate heavy machinery- No Do you currently wear CPAP- No Do you current wear oxygen- No Epworth- 10  07/26/2022 Patient presents today to review sleep study results.  Patient was seen for sleep consult September.  She reported having symptoms of snoring and waking up at night short of breath. She had home sleep study on 07/02/2022 that showed evidence of mild obstructive  sleep apnea, AHI 7.1 an hour with SpO2 low 68% (average 93%).   She has a hard time getting out of bed in the morning d.t fatigue. She feels exhausted during the day. She does not fall asleep driving. It is hard for her to sleep on her side, she has been sleeping on her back since having knee surgery. She just received disability.    09/26/2022- Interim hx  Patient presents today for OSA follow-up.  Patient had a home sleep study on 07/02/2022 that showed evidence of mild obstructive sleep apnea, AHI 7.1 an hour.  She was started on auto CPAP 5 to 15 cm H2O. She is doing well, she is 90% compliant with CPAP use > 4 hours over the last 30 days. She does not feel as tired and she is sleeping better. She is no longer having vivid dreams/nightmares. She is using nasal cradle mask, at times she has a hard time breathing out when wearing her mask. She was provided a chin strap. We discussed adjusting her pressure settings but she does not want to make any changes today.   [facility] download 08/26/22-09/24/22 30/30 days (100%); 27 days (90%) > 4 hours Average usage 6 hours 1 minutes Pressure 5-15cm h20 Airleaks 8.2L/min  AHI 2.4    Allergies  Allergen Reactions   Bee Venom Anaphylaxis and Other (See Comments)   Penicillins Other (See Comments) and Swelling    Reaction unknown from childhood Has patient had a PCN reaction causing immediate rash, facial/tongue/throat swelling, SOB or lightheadedness with hypotension: unknown Has patient had a PCN reaction causing severe rash involving  mucus membranes or skin necrosis: unknown  Has patient had a PCN reaction that required hospitalization: no Has patient had a PCN reaction occurring within the last 10 years: no If all of the above answers are "NO", then may proceed with Cephalosporin use.  Other reaction(s): Other, Unknown Throat swells Throat swells Reaction unknown from childhood Has patient had a PCN reaction causing immediate rash,  facial/tongue/throat swelling, SOB or lightheadedness with hypotension: unknown Has patient had a PCN reaction causing severe rash involving mucus membranes or skin necrosis: unknown  Has patient had a PCN reaction that required hospitalization: no Has patient had a PCN reaction occurring within the last 10 years: no If all of the above answers are "NO", then may proceed with Cephalosporin use.  Other reaction(s): Not available   Dextromethorphan     Other reaction(s): Lump in throat Other reaction(s): Lump in throat   Hydrocodone-Acetaminophen Other (See Comments)   Oxycodone Other (See Comments)   Penicillin G     Other reaction(s): Unknown   Dextromethorphan Polistirex Er Other (See Comments)    Pt states caused "a lump" in her throat, making it difficult to swallow   Fluconazole     Primidone interaction Other reaction(s): Primidone interaction Other reaction(s): Primidone interaction   Hydrocodone Nausea And Vomiting and Nausea Only    Other reaction(s): nausea and vomiting Other reaction(s): nausea and vomiting   Oxycodone-Acetaminophen Other (See Comments)    Made fingers tingle and go numb, started "feeling weird".  Other reaction(s): Other Made fingers tingle and go numb, started "feeling weird".  Other reaction(s): tingle/numb fingers Other reaction(s): tingle/numb fingers   Tape Itching and Other (See Comments)    Bandaids - leaves red marks  Other reaction(s): itching    Immunization History  Administered Date(s) Administered   Influenza Whole 03/19/2010   Influenza,inj,Quad PF,6+ Mos 03/25/2015, 04/13/2021   Influenza-Unspecified 03/19/2018   Moderna Sars-Covid-2 Vaccination 06/21/2019, 07/19/2019, 08/06/2020   Tdap 11/08/2016    Past Medical History:  Diagnosis Date   Allergy    Anal fissure    Anemia    borderline   Anxiety    Arthritis    Blood clot in vein    left leg   Detached retina    BOTH SCLERA BUCKLE BOTH EYES   DVT (deep venous  thrombosis)    left leg   Endometriosis    Family history of adverse reaction to anesthesia    DAUGHTER POST OP PONV   GERD (gastroesophageal reflux disease)    Glaucoma    RIGHT WORST THAN LEFT   Heart murmur    "caused by anxiety"    History of hemorrhoids    History of kidney stones    Hypertension    IBS (irritable bowel syndrome)    Perforated eardrum, right    SMALL HOLE   PONV (postoperative nausea and vomiting)    Pulmonary embolism 6 YRS AGO   Status post arthroscopy of left knee 08/06/2017    Tobacco History: Social History   Tobacco Use  Smoking Status Never  Smokeless Tobacco Never   Counseling given: Not Answered   Outpatient Medications Prior to Visit  Medication Sig Dispense Refill   Acetylcysteine (NAC) 600 MG CAPS Take 600 mg by mouth 2 (two) times daily.     acyclovir (ZOVIRAX) 400 MG tablet Take 1 tablet by mouth daily.     albuterol (VENTOLIN HFA) 108 (90 Base) MCG/ACT inhaler INHALE 1-2 PUFFS BY MOUTH EVERY 6 HOURS AS NEEDED FOR WHEEZE  OR SHORTNESS OF BREATH 8.5 each 2   amitriptyline (ELAVIL) 10 MG tablet TAKE 1 TABLET BY MOUTH EVERYDAY AT BEDTIME 90 tablet 1   bimatoprost (LUMIGAN) 0.03 % ophthalmic solution Place 1 drop into both eyes at bedtime.     Cholecalciferol (VITAMIN D) 50 MCG (2000 UT) tablet 1 tablet     clobetasol (TEMOVATE) 0.05 % external solution APPLY TO AFFECTED AREA TWICE A DAY 50 mL 0   clonazePAM (KLONOPIN) 0.5 MG tablet Take 1 tablet (0.5 mg total) by mouth at bedtime. 30 tablet 5   EPINEPHrine 0.3 mg/0.3 mL IJ SOAJ injection Inject 0.3 mg into the muscle as needed for anaphylaxis. 1 each 0   famotidine (PEPCID) 40 MG tablet TAKE 1 TABLET BY MOUTH EVERY DAY 90 tablet 1   gabapentin (NEURONTIN) 100 MG capsule TAKE 2 CAPSULES BY MOUTH AT BEDTIME. 180 capsule 1   gabapentin (NEURONTIN) 600 MG tablet Take 1 tablet by mouth 3 (three) times daily.     lidocaine (LIDODERM) 5 % Apply 1 patch topically daily.     LUMIGAN 0.01 % SOLN 1  drop at bedtime.     meloxicam (MOBIC) 15 MG tablet TAKE 1 TABLET (15 MG TOTAL) BY MOUTH DAILY. 30 tablet 2   methocarbamol (ROBAXIN) 500 MG tablet TAKE 1 TABLET BY MOUTH EVERY 8 HOURS AS NEEDED FOR MUSCLE SPASMS. 90 tablet 1   mometasone (NASONEX) 50 MCG/ACT nasal spray Place 2 sprays into the nose daily. 1 each 12   nebivolol (BYSTOLIC) 5 MG tablet TAKE 1 TABLET BY MOUTH EVERY DAY 90 tablet 3   Nitroglycerin (RECTIV) 0.4 % OINT Place 0.4 inches rectally 2 (two) times daily as needed (For anal fissures.). 30 g 1   ondansetron (ZOFRAN-ODT) 4 MG disintegrating tablet 1 tablet on the tongue and allow to dissolve Orally Once a day for 30 day(s)     pantoprazole (PROTONIX) 40 MG tablet TAKE 1 TABLET (40 MG TOTAL) BY MOUTH TWICE A DAY BEFORE MEALS 180 tablet 1   polyethylene glycol powder (GLYCOLAX/MIRALAX) 17 GM/SCOOP powder See admin instructions.     primidone (MYSOLINE) 50 MG tablet 1 tablet Orally Once a day for 30 day(s)     vitamin B-12 (CYANOCOBALAMIN) 1000 MCG tablet Take 1 tablet (1,000 mcg total) by mouth daily. 30 tablet 3   Zinc 50 MG TABS 1 tablet Orally Once a day for 30 day(s)     No facility-administered medications prior to visit.   Review of Systems  Review of Systems  Constitutional: Negative.   HENT: Negative.    Respiratory: Negative.    Cardiovascular: Negative.    Physical Exam  BP 122/70 (BP Location: Left Arm, Patient Position: Sitting, Cuff Size: Normal)   Pulse 65   Ht 5\' 7"  (1.702 m)   Wt 164 lb (74.4 kg)   LMP 10/11/2010   SpO2 100%   BMI 25.69 kg/m  Physical Exam Constitutional:      Appearance: Normal appearance.  HENT:     Head: Normocephalic and atraumatic.  Cardiovascular:     Rate and Rhythm: Normal rate.  Pulmonary:     Effort: Pulmonary effort is normal.  Musculoskeletal:        General: Normal range of motion.  Neurological:     General: No focal deficit present.     Mental Status: She is alert and oriented to person, place, and time.  Mental status is at baseline.  Psychiatric:        Mood and Affect: Mood  normal.        Behavior: Behavior normal.        Thought Content: Thought content normal.        Judgment: Judgment normal.      Lab Results:  CBC    Component Value Date/Time   WBC 5.8 08/25/2022 1102   RBC 4.53 08/25/2022 1102   HGB 13.2 08/25/2022 1102   HGB 12.0 01/20/2019 1649   HCT 38.9 08/25/2022 1102   HCT 35.5 01/20/2019 1649   PLT 293.0 08/25/2022 1102   PLT 293 01/20/2019 1649   MCV 85.9 08/25/2022 1102   MCV 84.7 06/09/2019 1451   MCV 87 01/20/2019 1649   MCH 30.9 11/08/2019 0737   MCHC 34.0 08/25/2022 1102   RDW 14.1 08/25/2022 1102   RDW 12.6 01/20/2019 1649   LYMPHSABS 1.6 06/07/2020 1625   LYMPHSABS 1.7 07/19/2018 1321   MONOABS 0.5 06/07/2020 1625   EOSABS 0.1 06/07/2020 1625   EOSABS 0.2 07/19/2018 1321   BASOSABS 0.1 06/07/2020 1625   BASOSABS 0.0 07/19/2018 1321    BMET    Component Value Date/Time   NA 140 08/25/2022 1102   NA 143 07/11/2022 1345   K 4.6 08/25/2022 1102   CL 104 08/25/2022 1102   CO2 31 08/25/2022 1102   GLUCOSE 97 08/25/2022 1102   BUN 15 08/25/2022 1102   BUN 13 07/11/2022 1345   CREATININE 0.75 08/25/2022 1102   CREATININE 0.73 10/21/2015 1919   CALCIUM 9.9 08/25/2022 1102   GFRNONAA >60 11/08/2019 0737   GFRAA >60 11/08/2019 0737    BNP No results found for: "BNP"  ProBNP No results found for: "PROBNP"  Imaging: No results found.   Assessment & Plan:   Mild sleep apnea - HST 07/02/22 >> Mild OSA, AHI 7.1/hour. Started on CPAP in February 2024 and reports benefit in fatigue and sleep quality with use. Patient is 90% compliant with CPAP use last 30 days, average usage 6 hours 1 mins. Pressure auto settings 5-15cm; AHI 2.4/hr. No changes today. Continue to encourage patient wear CPAP nightly. FU in 6 months or sooner if needed.    Glenford BayleyElizabeth W Amaal Dimartino, NP 09/26/2022

## 2022-09-26 NOTE — Assessment & Plan Note (Addendum)
-   HST 07/02/22 >> Mild OSA, AHI 7.1/hour. Started on CPAP in February 2024 and reports benefit in fatigue and sleep quality with use. Patient is 90% compliant with CPAP use last 30 days, average usage 6 hours 1 mins. Pressure auto settings 5-15cm; AHI 2.4/hr. No changes today. Continue to encourage patient wear CPAP nightly. FU in 6 months or sooner if needed.

## 2022-09-26 NOTE — Patient Instructions (Signed)
Download showed excellent compliance Sleep apnea is well controlled on pressure settings Continue to wear CPAP nightly for 4-6 hours or more Look into getting CPAP pillow for side sleeper  Send me a mychart message if you need pressure settings adjusted   Follow-up 6 months with Beth NP   CPAP and BIPAP Information CPAP and BIPAP are methods that use air pressure to keep your airways open and to help you breathe well. CPAP and BIPAP use different amounts of pressure. Your health care provider will tell you whether CPAP or BIPAP would be more helpful for you. CPAP stands for "continuous positive airway pressure." With CPAP, the amount of pressure stays the same while you breathe in (inhale) and out (exhale). BIPAP stands for "bi-level positive airway pressure." With BIPAP, the amount of pressure will be higher when you inhale and lower when you exhale. This allows you to take larger breaths. CPAP or BIPAP may be used in the hospital, or your health care provider may want you to use it at home. You may need to have a sleep study before your health care provider can order a machine for you to use at home. What are the advantages? CPAP or BIPAP can be helpful if you have: Sleep apnea. Chronic obstructive pulmonary disease (COPD). Heart failure. Medical conditions that cause muscle weakness, including muscular dystrophy or amyotrophic lateral sclerosis (ALS). Other problems that cause breathing to be shallow, weak, abnormal, or difficult. CPAP and BIPAP are most commonly used for obstructive sleep apnea (OSA) to keep the airways from collapsing when the muscles relax during sleep. What are the risks? Generally, this is a safe treatment. However, problems may occur, including: Irritated skin or skin sores if the mask does not fit properly. Dry or stuffy nose or nosebleeds. Dry mouth. Feeling gassy or bloated. Sinus or lung infection if the equipment is not cleaned properly. When should CPAP or  BIPAP be used? In most cases, the mask only needs to be worn during sleep. Generally, the mask needs to be worn throughout the night and during any daytime naps. People with certain medical conditions may also need to wear the mask at other times, such as when they are awake. Follow instructions from your health care provider about when to use the machine. What happens during CPAP or BIPAP?  Both CPAP and BIPAP are provided by a small machine with a flexible plastic tube that attaches to a plastic mask that you wear. Air is blown through the mask into your nose or mouth. The amount of pressure that is used to blow the air can be adjusted on the machine. Your health care provider will set the pressure setting and help you find the best mask for you. Tips for using the mask Because the mask needs to be snug, some people feel trapped or closed-in (claustrophobic) when first using the mask. If you feel this way, you may need to get used to the mask. One way to do this is to hold the mask loosely over your nose or mouth and then gradually apply the mask more snugly. You can also gradually increase the amount of time that you use the mask. Masks are available in various types and sizes. If your mask does not fit well, talk with your health care provider about getting a different one. Some common types of masks include: Full face masks, which fit over the mouth and nose. Nasal masks, which fit over the nose. Nasal pillow or prong masks, which  fit into the nostrils. If you are using a mask that fits over your nose and you tend to breathe through your mouth, a chin strap may be applied to help keep your mouth closed. Use a skin barrier to protect your skin as told by your health care provider. Some CPAP and BIPAP machines have alarms that may sound if the mask comes off or develops a leak. If you have trouble with the mask, it is very important that you talk with your health care provider about finding a way to  make the mask easier to tolerate. Do not stop using the mask. There could be a negative impact on your health if you stop using the mask. Tips for using the machine Place your CPAP or BIPAP machine on a secure table or stand near an electrical outlet. Know where the on/off switch is on the machine. Follow instructions from your health care provider about how to set the pressure on your machine and when you should use it. Do not eat or drink while the CPAP or BIPAP machine is on. Food or fluids could get pushed into your lungs by the pressure of the CPAP or BIPAP. For home use, CPAP and BIPAP machines can be rented or purchased through home health care companies. Many different brands of machines are available. Renting a machine before purchasing may help you find out which particular machine works well for you. Your health insurance company may also decide which machine you may get. Keep the CPAP or BIPAP machine and attachments clean. Ask your health care provider for specific instructions. Check the humidifier if you have a dry stuffy nose or nosebleeds. Make sure it is working correctly. Follow these instructions at home: Take over-the-counter and prescription medicines only as told by your health care provider. Ask if you can take sinus medicine if your sinuses are blocked. Do not use any products that contain nicotine or tobacco. These products include cigarettes, chewing tobacco, and vaping devices, such as e-cigarettes. If you need help quitting, ask your health care provider. Keep all follow-up visits. This is important. Contact a health care provider if: You have redness or pressure sores on your head, face, mouth, or nose from the mask or head gear. You have trouble using the CPAP or BIPAP machine. You cannot tolerate wearing the CPAP or BIPAP mask. Someone tells you that you snore even when wearing your CPAP or BIPAP. Get help right away if: You have trouble breathing. You feel  confused. Summary CPAP and BIPAP are methods that use air pressure to keep your airways open and to help you breathe well. If you have trouble with the mask, it is very important that you talk with your health care provider about finding a way to make the mask easier to tolerate. Do not stop using the mask. There could be a negative impact to your health if you stop using the mask. Follow instructions from your health care provider about when to use the machine. This information is not intended to replace advice given to you by your health care provider. Make sure you discuss any questions you have with your health care provider. Document Revised: 01/12/2021 Document Reviewed: 05/14/2020 Elsevier Patient Education  2023 [employer].

## 2022-10-02 ENCOUNTER — Ambulatory Visit: Payer: PRIVATE HEALTH INSURANCE | Admitting: Family

## 2022-10-04 ENCOUNTER — Other Ambulatory Visit: Payer: Self-pay | Admitting: Family

## 2022-10-04 ENCOUNTER — Telehealth: Payer: Self-pay | Admitting: Family

## 2022-10-04 DIAGNOSIS — F411 Generalized anxiety disorder: Secondary | ICD-10-CM

## 2022-10-04 NOTE — Telephone Encounter (Signed)
Refill for clonazePAM (KLONOPIN) 0.5 MG tablet   LR- 04/11/22- 30 tabs/ 5 refills LV- 04/11/22 NV- 10/11/22

## 2022-10-04 NOTE — Telephone Encounter (Signed)
Prescription Request  10/04/2022  LOV: 08/25/2022  What is the name of the medication or equipment?  clonazePAM (KLONOPIN) 0.5 MG tablet  Have you contacted your pharmacy to request a refill? No   Which pharmacy would you like this sent to?  [id number]en, [place] - [address] [place] [address] Phone: [phone] Fax: [phone]    Patient notified that their request is being sent to the clinical staff for review and that they should receive a response within 2 business days.   Please advise at Mobile [phone] (mobile)

## 2022-10-05 NOTE — Addendum Note (Signed)
Addended by: Gabriel Rainwater on: 10/05/2022 04:03 PM   Modules accepted: Orders

## 2022-10-05 NOTE — Telephone Encounter (Signed)
Duplicate. See prior refill message.

## 2022-10-10 ENCOUNTER — Encounter: Payer: Medicare HMO | Admitting: Physical Medicine and Rehabilitation

## 2022-10-11 ENCOUNTER — Encounter: Payer: Self-pay | Admitting: *Deleted

## 2022-10-11 ENCOUNTER — Ambulatory Visit (INDEPENDENT_AMBULATORY_CARE_PROVIDER_SITE_OTHER): Payer: Medicare HMO | Admitting: Family

## 2022-10-11 ENCOUNTER — Encounter: Payer: Self-pay | Admitting: Family

## 2022-10-11 VITALS — BP 128/78 | HR 72 | Temp 97.8°F | Ht 67.0 in | Wt 164.8 lb

## 2022-10-11 DIAGNOSIS — Z8249 Family history of ischemic heart disease and other diseases of the circulatory system: Secondary | ICD-10-CM | POA: Diagnosis not present

## 2022-10-11 DIAGNOSIS — F411 Generalized anxiety disorder: Secondary | ICD-10-CM

## 2022-10-11 DIAGNOSIS — R002 Palpitations: Secondary | ICD-10-CM

## 2022-10-11 DIAGNOSIS — R110 Nausea: Secondary | ICD-10-CM | POA: Diagnosis not present

## 2022-10-11 DIAGNOSIS — K219 Gastro-esophageal reflux disease without esophagitis: Secondary | ICD-10-CM | POA: Diagnosis not present

## 2022-10-11 DIAGNOSIS — R001 Bradycardia, unspecified: Secondary | ICD-10-CM | POA: Diagnosis not present

## 2022-10-11 DIAGNOSIS — Z91018 Allergy to other foods: Secondary | ICD-10-CM

## 2022-10-11 DIAGNOSIS — I10 Essential (primary) hypertension: Secondary | ICD-10-CM | POA: Diagnosis not present

## 2022-10-11 MED ORDER — AMITRIPTYLINE HCL 10 MG PO TABS: ORAL_TABLET | ORAL | 1 refills | Status: DC

## 2022-10-11 MED ORDER — ESCITALOPRAM OXALATE 10 MG PO TABS: 10.0000 mg | ORAL_TABLET | Freq: Every day | ORAL | 0 refills | Status: DC

## 2022-10-11 MED ORDER — ONDANSETRON 4 MG PO TBDP: ORAL_TABLET | ORAL | 0 refills | Status: DC

## 2022-10-11 NOTE — Patient Instructions (Addendum)
Restart elavil at night time.  Let me know in two weeks how you're feeling with the elavil as we can increase.   A referral was placed today for cardiology.  Please let us know if you have not heard back within 2 weeks about the referral.   Regards,   Cypher Paule FNP-C

## 2022-10-11 NOTE — Progress Notes (Unsigned)
Established Patient Office Visit  Subjective:      CC:  Chief Complaint  Patient presents with   Medical Management of Chronic Issues    HPI: Sharon [name] is a 57 y.o. female presenting on 10/11/2022 for Medical Management of Chronic Issues .   Mild sleep apnea, started on a cpap over the last thirty days with some improvement with fatigue and sleeping a bit better.   Multiple skin allergens with serum testing 1/2 Pt does state that she had skin prick testing back about six months ago which was unremarkable. Has not followed up with allergist since.  This was ordered by her pain management doctor.   Anxiety, still requires klonipin nightly to try to go to sleep. At night when she goes to lie down her back feels like something is crawling up her back, she does have nerve pain in her back. And so the clonazepam helps her to fall asleep. Didn't have this until after her mom died.   She is on elavil but has not been taking this consistently was given initially by the pain management doctor.   Does notice her palpitations have returned.  EKG in office 10/23 with bradycardia had referred pt to cardiology however she did not schedule. She is requesting a referral again today. Palpitations still ongoing, does drink caffeine.    Social history:  Relevant past medical, surgical, family and social history reviewed and updated as indicated. Interim medical history since our last visit reviewed.  Allergies and medications reviewed and updated.  DATA REVIEWED: CHART IN EPIC     ROS: Negative unless specifically indicated above in HPI.    Current Outpatient Medications:    Acetylcysteine (NAC) 600 MG CAPS, Take 600 mg by mouth 2 (two) times daily., Disp: , Rfl:    acyclovir (ZOVIRAX) 400 MG tablet, Take 1 tablet by mouth daily., Disp: , Rfl:    albuterol (VENTOLIN HFA) 108 (90 Base) MCG/ACT inhaler, INHALE 1-2 PUFFS BY MOUTH EVERY 6 HOURS AS NEEDED FOR WHEEZE OR SHORTNESS  OF BREATH, Disp: 8.5 each, Rfl: 2   bimatoprost (LUMIGAN) 0.03 % ophthalmic solution, Place 1 drop into both eyes at bedtime., Disp: , Rfl:    Cholecalciferol (VITAMIN D) 50 MCG (2000 UT) tablet, 1 tablet, Disp: , Rfl:    clobetasol (TEMOVATE) 0.05 % external solution, APPLY TO AFFECTED AREA TWICE A DAY, Disp: 50 mL, Rfl: 0   clonazePAM (KLONOPIN) 0.5 MG tablet, TAKE 1 TABLET BY MOUTH AT BEDTIME., Disp: 30 tablet, Rfl: 2   EPINEPHrine 0.3 mg/0.3 mL IJ SOAJ injection, Inject 0.3 mg into the muscle as needed for anaphylaxis., Disp: 1 each, Rfl: 0   estradiol (ESTRACE) 0.1 MG/GM vaginal cream, Insert pea-sized amount nightly for 2 weeks then every other night, Disp: , Rfl:    famotidine (PEPCID) 40 MG tablet, TAKE 1 TABLET BY MOUTH EVERY DAY, Disp: 90 tablet, Rfl: 1   gabapentin (NEURONTIN) 100 MG capsule, TAKE 2 CAPSULES BY MOUTH AT BEDTIME., Disp: 180 capsule, Rfl: 1   gabapentin (NEURONTIN) 600 MG tablet, Take 1 tablet by mouth 3 (three) times daily., Disp: , Rfl:    lidocaine (LIDODERM) 5 %, Apply 1 patch topically daily., Disp: , Rfl:    LUMIGAN 0.01 % SOLN, 1 drop at bedtime., Disp: , Rfl:    meloxicam (MOBIC) 15 MG tablet, TAKE 1 TABLET (15 MG TOTAL) BY MOUTH DAILY., Disp: 30 tablet, Rfl: 2   methocarbamol (ROBAXIN) 500 MG tablet, TAKE 1 TABLET BY MOUTH  EVERY 8 HOURS AS NEEDED FOR MUSCLE SPASMS., Disp: 90 tablet, Rfl: 1   mometasone (NASONEX) 50 MCG/ACT nasal spray, Place 2 sprays into the nose daily., Disp: 1 each, Rfl: 12   nebivolol (BYSTOLIC) 5 MG tablet, TAKE 1 TABLET BY MOUTH EVERY DAY, Disp: 90 tablet, Rfl: 3   Nitroglycerin (RECTIV) 0.4 % OINT, Place 0.4 inches rectally 2 (two) times daily as needed (For anal fissures.)., Disp: 30 g, Rfl: 1   pantoprazole (PROTONIX) 40 MG tablet, TAKE 1 TABLET (40 MG TOTAL) BY MOUTH TWICE A DAY BEFORE MEALS, Disp: 180 tablet, Rfl: 1   polyethylene glycol powder (GLYCOLAX/MIRALAX) 17 GM/SCOOP powder, See admin instructions., Disp: , Rfl:    primidone  (MYSOLINE) 50 MG tablet, 1 tablet Orally Once a day for 30 day(s), Disp: , Rfl:    vitamin B-12 (CYANOCOBALAMIN) 1000 MCG tablet, Take 1 tablet (1,000 mcg total) by mouth daily., Disp: 30 tablet, Rfl: 3   Zinc 50 MG TABS, 1 tablet Orally Once a day for 30 day(s), Disp: , Rfl:    amitriptyline (ELAVIL) 10 MG tablet, TAKE 1 TABLET BY MOUTH EVERYDAY AT BEDTIME, Disp: 90 tablet, Rfl: 1   ondansetron (ZOFRAN-ODT) 4 MG disintegrating tablet, 1 tablet on the tongue and allow to dissolve Orally Once a day for 30 day(s), Disp: 20 tablet, Rfl: 0      Objective:    BP 128/78 (BP Location: Left Arm)   Pulse 72   Temp 97.8 F (36.6 C) (Temporal)   Ht  (1.702 m)   Wt 164 lb 12.8 oz (74.8 kg)   LMP 10/11/2010   SpO2 99%   BMI 25.81 kg/m   Wt Readings from Last 3 Encounters:  10/11/22 164 lb 12.8 oz (74.8 kg)  09/26/22 164 lb (74.4 kg)  08/25/22 164 lb 9.6 oz (74.7 kg)    Physical Exam Constitutional:      General: She is not in acute distress.    Appearance: Normal appearance. She is normal weight. She is not ill-appearing, toxic-appearing or diaphoretic.  HENT:     Head: Normocephalic.  Cardiovascular:     Rate and Rhythm: Normal rate and regular rhythm.  Pulmonary:     Effort: Pulmonary effort is normal.  Abdominal:     General: Abdomen is flat.     Tenderness: There is no abdominal tenderness.  Musculoskeletal:        General: Normal range of motion.  Neurological:     General: No focal deficit present.     Mental Status: She is alert and oriented to person, place, and time. Mental status is at baseline.  Psychiatric:        Mood and Affect: Mood normal.        Behavior: Behavior normal.        Thought Content: Thought content normal.        Judgment: Judgment normal.         Assessment & Plan:  Multiple food allergies Assessment & Plan: Serum testing with multiple food allergies however d/w pt that I would like her to get evaluated further by allergist, pt  requesting second opinion, to verify as serum testing can be sensitive but not always specific.  However this would make sense and could contribute to current symptoms experienced.   Orders: -     Ambulatory referral to Allergy  Gastroesophageal reflux disease, unspecified whether esophagitis present  Chronic nausea -     Ondansetron; 1 tablet on the tongue and allow to  dissolve Orally Once a day for 30 day(s)  Dispense: 20 tablet; Refill: 0  GAD (generalized anxiety disorder) Assessment & Plan: Worsening slightly  Did address to take elavil more consistently as can help with neuropathic pain as well as anxiety     Primary hypertension Assessment & Plan: stable  Orders: -     Ambulatory referral to Cardiology  Bradycardia on ECG -     Ambulatory referral to Cardiology  Palpitations Assessment & Plan: Ongoing  Pt has not yet been evaluated by cardiologist as she declined referral placed back in the fall.  Putting in new referral to cardiology for eval/treat EKG bradycardia 9/23 Did advise pt to limit caffeine as well  [facility] MI  Orders: -     Ambulatory referral to Cardiology  Family history of heart attack -     Ambulatory referral to Cardiology  Other orders -     Amitriptyline HCl; TAKE 1 TABLET BY MOUTH EVERYDAY AT BEDTIME  Dispense: 90 tablet; Refill: 1     Return in about 6 months (around 04/12/2023) for f/u anxiety.  Mort Sawyers, MSN, APRN, FNP-C [facility]

## 2022-10-12 DIAGNOSIS — Z91018 Allergy to other foods: Secondary | ICD-10-CM | POA: Insufficient documentation

## 2022-10-12 NOTE — Assessment & Plan Note (Addendum)
Ongoing  Pt has not yet been evaluated by cardiologist as she declined referral placed back in the fall.  Putting in new referral to cardiology for eval/treat EKG bradycardia 9/23 Did advise pt to limit caffeine as well  [facility] MI

## 2022-10-12 NOTE — Assessment & Plan Note (Signed)
stable °

## 2022-10-12 NOTE — Assessment & Plan Note (Signed)
Worsening slightly  Did address to take elavil more consistently as can help with neuropathic pain as well as anxiety

## 2022-10-12 NOTE — Assessment & Plan Note (Signed)
Serum testing with multiple food allergies however d/w pt that I would like her to get evaluated further by allergist, pt requesting second opinion, to verify as serum testing can be sensitive but not always specific.  However this would make sense and could contribute to current symptoms experienced.

## 2022-10-13 ENCOUNTER — Ambulatory Visit: Payer: PRIVATE HEALTH INSURANCE | Admitting: Family

## 2022-10-14 ENCOUNTER — Other Ambulatory Visit: Payer: Self-pay | Admitting: Physical Medicine and Rehabilitation

## 2022-10-16 ENCOUNTER — Other Ambulatory Visit: Payer: Self-pay | Admitting: Obstetrics and Gynecology

## 2022-10-16 DIAGNOSIS — Z1231 Encounter for screening mammogram for malignant neoplasm of breast: Secondary | ICD-10-CM

## 2022-10-17 ENCOUNTER — Encounter: Payer: Self-pay | Admitting: Family

## 2022-10-17 DIAGNOSIS — N3000 Acute cystitis without hematuria: Secondary | ICD-10-CM

## 2022-10-18 ENCOUNTER — Encounter: Payer: Self-pay | Admitting: Primary Care

## 2022-10-18 ENCOUNTER — Ambulatory Visit (INDEPENDENT_AMBULATORY_CARE_PROVIDER_SITE_OTHER): Payer: Medicare HMO | Admitting: Primary Care

## 2022-10-18 VITALS — BP 136/80 | HR 76 | Temp 98.2°F | Ht 67.0 in | Wt 165.0 lb

## 2022-10-18 DIAGNOSIS — R300 Dysuria: Secondary | ICD-10-CM | POA: Insufficient documentation

## 2022-10-18 LAB — POC URINALSYSI DIPSTICK (AUTOMATED)
Bilirubin, UA: NEGATIVE
Blood, UA: POSITIVE
Glucose, UA: NEGATIVE
Ketones, UA: NEGATIVE
Nitrite, UA: NEGATIVE
Protein, UA: NEGATIVE
Spec Grav, UA: <=1.005 — AB (ref 1.010–1.025)
Urobilinogen, UA: 0.2 E.U./dL — AB
pH, UA: 6.5 (ref 5.0–8.0)

## 2022-10-18 NOTE — Assessment & Plan Note (Addendum)
Symptoms consistent for cystitis.  UA today with 1+ leuks and trace blood. Upon review of prior UA with culture it appears that she has a chronic history of microscopic hematuria with negative urine cultures. She does follow with [facility]. Discussed this with patient today.  Culture ordered and pending. Await results prior to treatment.   She will call sooner if she develops fevers, worsening symptoms.

## 2022-10-18 NOTE — Telephone Encounter (Signed)
Spoke to patient by telephone and scheduled her an appointment today 10/18/22 at 11:00 am with Mayra Reel NP for her UTI symptoms. Patient requested that she drop off a urine sample because she is really busy today. Patient was advised that she really needs to be seen to be treated for a UTI. Patient was advised that the mychart message will be sent to Mort Sawyers FNP for her to answer her other questions.

## 2022-10-18 NOTE — Progress Notes (Signed)
Subjective:    Patient ID: Sharon [name], female    DOB: [birth year], 57 y.o.   MRN: [id number]  Dysuria  Associated symptoms include frequency. Pertinent negatives include no nausea or vomiting.    Lashunta Frieden is a very pleasant 57 y.o. female patient of Tabitha, NP with a history of rectocele, IBS, endometriosis s/p hysterectomy, acute cystitis, chronic pelvic pain, urinary incontinence with bladder sling, prediabetes who presents today to discuss dysuria.   Symptom onset four days ago with dysuria, urinary urgency and frequency, foul smelling urine odor, pelvic discomfort. She denies hematuria, fevers, vomiting.   Evaluated by PCP on 08/25/22 for dysuria, lower abdominal pain, right sided flank pain. She underwent CT abdomen/pelvis which was negative for renal stones or acute abnormality. Urine culture returned positive for Proteus mirabilis resistant to Macrobid. She was treated with Bactrim DS BID x 7 days.  She doesn't feel that her symptoms from her UTI in March completely resolved. She's not taken anything OTC for symptoms.      Review of Systems  Constitutional:  Negative for fever.  Gastrointestinal:  Negative for abdominal pain, nausea and vomiting.  Genitourinary:  Positive for dysuria, frequency and pelvic pain. Negative for vaginal discharge.         Past Medical History:  Diagnosis Date   Allergy    Anal fissure    Anemia    borderline   Anxiety    Arthritis    Blood clot in vein    left leg   Detached retina    BOTH SCLERA BUCKLE BOTH EYES   DVT (deep venous thrombosis) (HCC)    left leg   Endometriosis    Family history of adverse reaction to anesthesia    DAUGHTER POST OP PONV   GERD (gastroesophageal reflux disease)    Glaucoma    RIGHT WORST THAN LEFT   Heart murmur    "caused by anxiety"    History of hemorrhoids    History of kidney stones    Hypertension    IBS (irritable bowel syndrome)    Perforated eardrum, right    SMALL  HOLE   PONV (postoperative nausea and vomiting)    Pulmonary embolism (HCC) 6 YRS AGO   Status post arthroscopy of left knee 08/06/2017    Social History   Socioeconomic History   Marital status: Divorced    Spouse name: Not on file   Number of children: 1   Years of education: Not on file   Highest education level: Not on file  Occupational History   Occupation: IT sales professional: [employer]  Tobacco Use   Smoking status: Never   Smokeless tobacco: Never  Vaping Use   Vaping Use: Never used  Substance and Sexual Activity   Alcohol use: Not Currently    Comment: occ   Drug use: No    Comment: former maryjuana use- quit in 1995   Sexual activity: Yes    Partners: Male    Birth control/protection: Surgical  Other Topics Concern   Not on file  Social History Narrative   Marital status: divorced x 2; dating seriously x 3 years.        Children:  1 daughter (49); no grandchildren      Lives:  With boyfriend.        Employment: works at [employer]      Tobacco: none      Alcohol: rarely; socially  Exercise: none; has treadmill and elliptical and bikes   Coffee in am / 1/2 of coke in afternoon    High school education      01/29/20   From: the area   Living: alone - but boyfriend living with her Genelle Bal (2020) but knew each other in high school   Work: on disability - working on appeal      Family: one daughter Leonette Nutting - one grandchild coming in January       Enjoys: relax and watch tv, use to like fishing/boating/rafting, reading, crochet       Exercise: rehab exercises   Diet: not great - limited intake      Safety   Seat belts: Yes    Guns: Yes  and secure   Right handed   One story home   Drinks caffeine   Social Determinants of Health   Financial Resource Strain: Not on file  Food Insecurity: Not on file  Transportation Needs: Not on file  Physical Activity: Not on file  Stress: Not on file  Social Connections: Not on  file  Intimate Partner Violence: Not on file    Past Surgical History:  Procedure Laterality Date   ABDOMINAL HYSTERECTOMY     still with both ovaries   CATARACT EXTRACTION Bilateral 2015   COLONOSCOPY     CYSTOSCOPY W/ URETERAL STENT PLACEMENT Right 05/23/2017   Procedure: CYSTOSCOPY WITH RETROGRADE PYELOGRAM/URETERAL RIGHT STENT PLACEMENT;  Surgeon: Alfredo Martinez, MD;  Location: [facility];  Service: Urology;  Laterality: Right;   CYSTOSCOPY W/ URETERAL STENT PLACEMENT Right 06/04/2017   Procedure: CYSTOSCOPY WITHRIGHT RETROGRADE PYELOGRAMRIGHT /URETEREOSCOPY  STENT PLACEMENT;  Surgeon: Crista Elliot, MD;  Location: [facility];  Service: Urology;  Laterality: Right;   ENDOMETRIAL ABLATION     ESOPHAGOGASTRODUODENOSCOPY (EGD) WITH PROPOFOL N/A 08/16/2022   Procedure: ESOPHAGOGASTRODUODENOSCOPY (EGD) WITH PROPOFOL;  Surgeon: Toney Reil, MD;  Location: [facility];  Service: Gastroenterology;  Laterality: N/A;   EYE SURGERY     HEMORRHOID SURGERY  2011   INCONTINENCE SURGERY     INNER EAR SURGERY     X 2   RECTAL PROLAPSE REPAIR     RETINAL DETACHMENT SURGERY Bilateral 2000   TOTAL KNEE ARTHROPLASTY Left 04/29/2019   Procedure: LEFT TOTAL KNEE ARTHROPLASTY;  Surgeon: Kathryne Hitch, MD;  Location: [facility];  Service: Orthopedics;  Laterality: Left;   TUBAL LIGATION     VEIN SURGERY Left 04/2010    Family History  Problem Relation Age of Onset   Emphysema Mother        smoker   Allergies Mother    Asthma Mother    Heart disease Mother        AMI as cause of death   COPD Mother    Asthma Father    Heart disease Father        AMI as cause of death   Diabetes Father    Asthma Brother    Heart disease Brother 35       heart failure   Lung cancer Maternal Grandfather        was a smoker   Cancer Maternal Grandfather        lung   Heart disease Paternal Grandmother        AMI   Heart disease Paternal Grandfather        AMI   Colon  cancer Neg Hx     Allergies  Allergen Reactions  Bee Venom Anaphylaxis and Other (See Comments)   Penicillins Other (See Comments) and Swelling    Reaction unknown from childhood Has patient had a PCN reaction causing immediate rash, facial/tongue/throat swelling, SOB or lightheadedness with hypotension: unknown Has patient had a PCN reaction causing severe rash involving mucus membranes or skin necrosis: unknown  Has patient had a PCN reaction that required hospitalization: no Has patient had a PCN reaction occurring within the last 10 years: no If all of the above answers are "NO", then may proceed with Cephalosporin use.  Other reaction(s): Other, Unknown Throat swells Throat swells Reaction unknown from childhood Has patient had a PCN reaction causing immediate rash, facial/tongue/throat swelling, SOB or lightheadedness with hypotension: unknown Has patient had a PCN reaction causing severe rash involving mucus membranes or skin necrosis: unknown  Has patient had a PCN reaction that required hospitalization: no Has patient had a PCN reaction occurring within the last 10 years: no If all of the above answers are "NO", then may proceed with Cephalosporin use.  Other reaction(s): Not available   Dextromethorphan     Other reaction(s): Lump in throat Other reaction(s): Lump in throat   Hydrocodone-Acetaminophen Other (See Comments)   Oxycodone Other (See Comments)   Penicillin G     Other reaction(s): Unknown   Dextromethorphan Polistirex Er Other (See Comments)    Pt states caused "a lump" in her throat, making it difficult to swallow   Fluconazole     Primidone interaction Other reaction(s): Primidone interaction Other reaction(s): Primidone interaction   Hydrocodone Nausea And Vomiting and Nausea Only    Other reaction(s): nausea and vomiting Other reaction(s): nausea and vomiting   Oxycodone-Acetaminophen Other (See Comments)    Made fingers tingle and go numb, started  "feeling weird".  Other reaction(s): Other Made fingers tingle and go numb, started "feeling weird".  Other reaction(s): tingle/numb fingers Other reaction(s): tingle/numb fingers   Tape Itching and Other (See Comments)    Bandaids - leaves red marks  Other reaction(s): itching    Current Outpatient Medications on File Prior to Visit  Medication Sig Dispense Refill   Acetylcysteine (NAC) 600 MG CAPS Take 600 mg by mouth 2 (two) times daily.     acyclovir (ZOVIRAX) 400 MG tablet Take 1 tablet by mouth daily.     albuterol (VENTOLIN HFA) 108 (90 Base) MCG/ACT inhaler INHALE 1-2 PUFFS BY MOUTH EVERY 6 HOURS AS NEEDED FOR WHEEZE OR SHORTNESS OF BREATH 8.5 each 2   amitriptyline (ELAVIL) 10 MG tablet TAKE 1 TABLET BY MOUTH EVERYDAY AT BEDTIME 90 tablet 1   bimatoprost (LUMIGAN) 0.03 % ophthalmic solution Place 1 drop into both eyes at bedtime.     Cholecalciferol (VITAMIN D) 50 MCG (2000 UT) tablet 1 tablet     clobetasol (TEMOVATE) 0.05 % external solution APPLY TO AFFECTED AREA TWICE A DAY 50 mL 0   clonazePAM (KLONOPIN) 0.5 MG tablet TAKE 1 TABLET BY MOUTH AT BEDTIME. 30 tablet 2   EPINEPHrine 0.3 mg/0.3 mL IJ SOAJ injection Inject 0.3 mg into the muscle as needed for anaphylaxis. 1 each 0   estradiol (ESTRACE) 0.1 MG/GM vaginal cream Insert pea-sized amount nightly for 2 weeks then every other night     famotidine (PEPCID) 40 MG tablet TAKE 1 TABLET BY MOUTH EVERY DAY 90 tablet 1   gabapentin (NEURONTIN) 100 MG capsule TAKE 2 CAPSULES BY MOUTH AT BEDTIME. 180 capsule 1   gabapentin (NEURONTIN) 600 MG tablet Take 1 tablet by mouth 3 (three)  times daily.     lidocaine (LIDODERM) 5 % Apply 1 patch topically daily.     LUMIGAN 0.01 % SOLN 1 drop at bedtime.     meloxicam (MOBIC) 15 MG tablet TAKE 1 TABLET (15 MG TOTAL) BY MOUTH DAILY. 30 tablet 2   methocarbamol (ROBAXIN) 500 MG tablet TAKE 1 TABLET BY MOUTH EVERY 8 HOURS AS NEEDED FOR MUSCLE SPASMS. 90 tablet 1   mometasone (NASONEX) 50  MCG/ACT nasal spray Place 2 sprays into the nose daily. 1 each 12   nebivolol (BYSTOLIC) 5 MG tablet TAKE 1 TABLET BY MOUTH EVERY DAY 90 tablet 3   Nitroglycerin (RECTIV) 0.4 % OINT Place 0.4 inches rectally 2 (two) times daily as needed (For anal fissures.). 30 g 1   ondansetron (ZOFRAN-ODT) 4 MG disintegrating tablet 1 tablet on the tongue and allow to dissolve Orally Once a day for 30 day(s) 20 tablet 0   pantoprazole (PROTONIX) 40 MG tablet TAKE 1 TABLET (40 MG TOTAL) BY MOUTH TWICE A DAY BEFORE MEALS 180 tablet 1   polyethylene glycol powder (GLYCOLAX/MIRALAX) 17 GM/SCOOP powder See admin instructions.     primidone (MYSOLINE) 50 MG tablet 1 tablet Orally Once a day for 30 day(s)     vitamin B-12 (CYANOCOBALAMIN) 1000 MCG tablet Take 1 tablet (1,000 mcg total) by mouth daily. 30 tablet 3   Zinc 50 MG TABS 1 tablet Orally Once a day for 30 day(s)     [DISCONTINUED] primidone (MYSOLINE) 50 MG tablet TAKE 1 TABLET IN MORNING AND 3 TABLETS AT BEDTIME 120 tablet 0   No current facility-administered medications on file prior to visit.    BP 136/80   Pulse 76   Temp 98.2 F (36.8 C) (Temporal)   Ht 5\' 7"  (1.702 m)   Wt 165 lb (74.8 kg)   LMP 10/11/2010   SpO2 96%   BMI 25.84 kg/m  Objective:   Physical Exam Constitutional:      General: She is not in acute distress.    Appearance: She is not ill-appearing.  Pulmonary:     Effort: Pulmonary effort is normal.  Abdominal:     Tenderness: There is abdominal tenderness in the suprapubic area. There is no right CVA tenderness or left CVA tenderness.  Neurological:     Mental Status: She is alert.           Assessment & Plan:  Dysuria Assessment & Plan: Symptoms consistent for cystitis.  UA today with 1+ leuks and trace blood. Upon review of prior UA with culture it appears that she has a chronic history of microscopic hematuria with negative urine cultures. She does follow with [facility]. Discussed this with patient  today.  Culture ordered and pending. Await results prior to treatment.   She will call sooner if she develops fevers, worsening symptoms.   Orders: -     POCT Urinalysis Dipstick (Automated) -     Urine Culture        Doreene Nest, NP

## 2022-10-18 NOTE — Telephone Encounter (Signed)
Noted  

## 2022-10-18 NOTE — Patient Instructions (Signed)
We will be in touch with your urine test on Friday.  It was a pleasure meeting you!

## 2022-10-20 ENCOUNTER — Ambulatory Visit: Payer: PRIVATE HEALTH INSURANCE | Admitting: Family

## 2022-10-20 ENCOUNTER — Other Ambulatory Visit: Payer: Self-pay | Admitting: Primary Care

## 2022-10-20 DIAGNOSIS — N3001 Acute cystitis with hematuria: Secondary | ICD-10-CM

## 2022-10-20 LAB — URINE CULTURE
MICRO NUMBER:: 14899153
SPECIMEN QUALITY:: ADEQUATE

## 2022-10-20 MED ORDER — SULFAMETHOXAZOLE-TRIMETHOPRIM 800-160 MG PO TABS: 1.0000 | ORAL_TABLET | Freq: Two times a day (BID) | ORAL | 0 refills | Status: DC

## 2022-10-23 MED ORDER — CIPROFLOXACIN HCL 500 MG PO TABS: 500.0000 mg | ORAL_TABLET | Freq: Two times a day (BID) | ORAL | 0 refills | Status: AC

## 2022-10-24 ENCOUNTER — Encounter: Payer: Medicare HMO | Attending: Physical Medicine and Rehabilitation | Admitting: Physical Medicine and Rehabilitation

## 2022-10-24 ENCOUNTER — Encounter: Payer: Self-pay | Admitting: Physical Medicine and Rehabilitation

## 2022-10-24 VITALS — BP 118/78 | HR 80 | Ht 67.0 in | Wt 159.0 lb

## 2022-10-24 DIAGNOSIS — R413 Other amnesia: Secondary | ICD-10-CM | POA: Insufficient documentation

## 2022-10-24 DIAGNOSIS — M5440 Lumbago with sciatica, unspecified side: Secondary | ICD-10-CM | POA: Diagnosis not present

## 2022-10-24 DIAGNOSIS — G8929 Other chronic pain: Secondary | ICD-10-CM | POA: Diagnosis not present

## 2022-10-24 DIAGNOSIS — R079 Chest pain, unspecified: Secondary | ICD-10-CM | POA: Insufficient documentation

## 2022-10-24 DIAGNOSIS — G4701 Insomnia due to medical condition: Secondary | ICD-10-CM | POA: Insufficient documentation

## 2022-10-24 DIAGNOSIS — F13939 Sedative, hypnotic or anxiolytic use, unspecified with withdrawal, unspecified: Secondary | ICD-10-CM

## 2022-10-24 DIAGNOSIS — R7303 Prediabetes: Secondary | ICD-10-CM

## 2022-10-24 DIAGNOSIS — F411 Generalized anxiety disorder: Secondary | ICD-10-CM | POA: Insufficient documentation

## 2022-10-24 DIAGNOSIS — M25561 Pain in right knee: Secondary | ICD-10-CM

## 2022-10-24 DIAGNOSIS — M25562 Pain in left knee: Secondary | ICD-10-CM | POA: Diagnosis not present

## 2022-10-24 MED ORDER — GABAPENTIN 600 MG PO TABS: 600.0000 mg | ORAL_TABLET | Freq: Three times a day (TID) | ORAL | 3 refills | Status: DC

## 2022-10-24 MED ORDER — METHOCARBAMOL 500 MG PO TABS: 500.0000 mg | ORAL_TABLET | Freq: Three times a day (TID) | ORAL | 1 refills | Status: DC | PRN

## 2022-10-24 MED ORDER — ROPINIROLE HCL 0.25 MG PO TABS: 0.2500 mg | ORAL_TABLET | Freq: Every day | ORAL | 3 refills | Status: DC

## 2022-10-24 MED ORDER — GABAPENTIN 100 MG PO CAPS: 200.0000 mg | ORAL_CAPSULE | Freq: Every day | ORAL | 1 refills | Status: DC

## 2022-10-24 MED ORDER — MELOXICAM 15 MG PO TABS: 15.0000 mg | ORAL_TABLET | Freq: Every day | ORAL | 2 refills | Status: DC

## 2022-10-24 NOTE — Addendum Note (Signed)
Addended by: Horton Chin on: 10/24/2022 03:15 PM   Modules accepted: Orders

## 2022-10-24 NOTE — Progress Notes (Addendum)
Subjective:    Patient ID: Sharon [name], female    DOB: [birth year], 57 y.o.   MRN: [id number]  HPI  Sharon [name] is a 57 year old woman who presents for f/u bilateral knee pain following left knee replacement in 04/2019, transaminitis, shortness of breath, brain fog, and prediabetes.   1) Bilateral knee pain L>R: -right knee pain is getting worse -getting a new bike and she is hoping that strengthening her muscles will help -went to church a couple times but it hurt so badly. -right knee pain has been getting worse -Her ROM is still restricted in her left knee.  -ice helps -radiates between hip and left knee Feels like a charley horse -She asks whether the scar tissue will ever heal.  -Cannot sleep without a pillow underneath her legs.  -She has follow-up with Dr. Turner Daniels and Dr. Magnus Ivan -Currently is not planning to pursue surgery.  -She has been off hydrocodone.  -She would be interested in increasing the Gabapentin dose.  -She would like to discuss an option for inflammation. She has tried aspirations but the fluid comes back right away.  -She got a  Steroid injection and experienced worsening range of motion in her left knee- that was from Dr. Turner Daniels. He also pulled fluid off. It does help with pain, but she does not want another given her worsening range of motion afterward. She does have improved sensation in her knee after the injection! -has been hurting -discussed Qutenza as a treatment option -her current regimen is working for her.  -has been having digestive issues and stopped meloxicam.   2) Anxiety: This has much improved with the Gabapentin.  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  -she has used blue emu oil and diclofenac gel without great benefit -has appointment with physician today.  -she does use her hands a lot.  -she has been dropping things recently.   9) lower back pain -has gotten worse -got Xrs and was shown to have cyst on right side and bulging disc on left side and pinched nerve in the middle.  -she feels a constant burning -worst on left side -feels she is walking crooked -she had an injection by Dr. Modesto Charon at orthopedics but these did not help.  -she know has a bone further up that is bothering her.  -left pain in her boy friend's car so had a hard time getting in today -hurts severely sometimes -pinches when she walks -sitting also hurts -she does not have insurance right now -ice helps -she uses heat for the muscular pain -hurt it while bending and turning -has been worse since she carried her grandchild -she is unable to vacuum.  -discussed therapy and using lidocaine patch, massage.  -she is ok using gabapentin and her muscle relaxers.  -found lidocaine patch to be a lifesaver while she was at the beach.  -ready to try Qutenza today -has been severe recently -plans to follow-up with her orthopedic surgeon.   10) Constipation -has been needing to take laxatives.   11) shortness of breath -worried abut blot clots -she asks about results of her vascular ultrasound of her lower extremity and what treatment is necessary, how she would know  if there are clots elsewhere in her body, and how she may have developed these clots.  -she has being set up for tests  12) impaired memory  13) Prediabetes -she asks about HgbA1c test result  14) Transaminitis -she asks whether any of her medications could have caused this'  15) Food allergy -she asks about her food allergy results  Prior history:  Since last visit she has stopped Norco as it caused respiratory distress. We tried Cymbalta 40mg  without benefit or side effects. She  has tried voltaren gel, blue emu oil without benefit. She has tried CBD.   She has been trying to improve quadriceps musculature. She discusses some of the exercises she does.   She has been denied in her disability. She has talked to an attorney.  She continues to ambulate with a cane.   She has tried the compounding cream and that does not help much. She has been trying Hemp cream which is helping.  She is getting foot insoles from the podiatrist. They should be ready in a week or two.   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   Can ride the stationary bicycle and that does not hurt. She sues it every day. She uses leg weights. When   Scar tissue grew back quickly post-surgery. She could not get PT appointments for 3 weeks.   Her surgeon does not want her to break the hardware and he may have to do arthroscopic procedure to flush out the inflammation.   She has tried ice which stopped helping, nabumetone 750 mg BID, Norco 1-2 tablets three times per day PRN (she has 12 left). She uses diclofenac gel   She takes Miralax for constipation. She takes up to 2 times per day. She has a history of IBS.   [facility] in [place]- she was a Scientist, physiological. They let her go because she did not return in 12 weeks.   She was doing pool therapy but chemicals in the pool were increased because of COVID.  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 Notes reviewed- recently had a right subacromial injection.   Current pain is 8/10.   12) Hip pain   Pain Inventory Average Pain 8 Pain Right Now 8 My pain is constant, sharp, burning, dull, stabbing, and aching  In the last 24 hours, has pain interfered with the following? General activity 4 Relation with others 4 Enjoyment of life 4 What TIME of day is your pain at its worst? evening, daytime & night Sleep (in general)  Fair  Pain is worse with: walking, bending, sitting, inactivity, and standing Pain improves with: rest, heat/ice, and medication Relief from Meds: 6     Family History  Problem Relation Age of Onset   Emphysema Mother        smoker   Allergies Mother    Asthma Mother    Heart disease Mother        AMI as cause of death   COPD Mother    Asthma Father    Heart disease Father        AMI as cause of death   Diabetes Father    Asthma Brother    Heart disease Brother 40       heart failure   Lung cancer Maternal Grandfather        was a smoker  Cancer Maternal Grandfather        lung   Heart disease Paternal Grandmother        AMI   Heart disease Paternal Grandfather        AMI   Colon cancer Neg Hx    Social History   Socioeconomic History   Marital status: Divorced    Spouse name: Not on file   Number of children: 1   Years of education: Not on file   Highest education level: Not on file  Occupational History   Occupation: IT sales professional: [employer]  Tobacco Use   Smoking status: Never   Smokeless tobacco: Never  Vaping Use   Vaping Use: Never used  Substance and Sexual Activity   Alcohol use: Not Currently    Comment: occ   Drug use: No    Comment: former maryjuana use- quit in 1995   Sexual activity: Yes    Partners: Male    Birth control/protection: Surgical  Other Topics Concern   Not on file  Social History Narrative   Marital status: divorced x 2; dating seriously x 3 years.        Children:  1 daughter (29); no grandchildren      Lives:  With boyfriend.        Employment: works at [employer]      Tobacco: none      Alcohol: rarely; socially      Exercise: none; has treadmill and elliptical and bikes   Coffee in am / 1/2 of coke in afternoon    High school education      01/29/20   From: the area   Living: alone - but boyfriend living with her Genelle Bal (2020) but knew each other in high school   Work: on disability -  working on appeal      Family: one daughter Leonette Nutting - one grandchild coming in January       Enjoys: relax and watch tv, use to like fishing/boating/rafting, reading, crochet       Exercise: rehab exercises   Diet: not great - limited intake      Safety   Seat belts: Yes    Guns: Yes  and secure   Right handed   One story home   Drinks caffeine   Social Determinants of Health   Financial Resource Strain: Not on file  Food Insecurity: Not on file  Transportation Needs: Not on file  Physical Activity: Not on file  Stress: Not on file  Social Connections: Not on file   Past Surgical History:  Procedure Laterality Date   ABDOMINAL HYSTERECTOMY     still with both ovaries   CATARACT EXTRACTION Bilateral 2015   COLONOSCOPY     CYSTOSCOPY W/ URETERAL STENT PLACEMENT Right 05/23/2017   Procedure: CYSTOSCOPY WITH RETROGRADE PYELOGRAM/URETERAL RIGHT STENT PLACEMENT;  Surgeon: Alfredo Martinez, MD;  Location: [facility];  Service: Urology;  Laterality: Right;   CYSTOSCOPY W/ URETERAL STENT PLACEMENT Right 06/04/2017   Procedure: CYSTOSCOPY WITHRIGHT RETROGRADE PYELOGRAMRIGHT /URETEREOSCOPY  STENT PLACEMENT;  Surgeon: Crista Elliot, MD;  Location: [facility];  Service: Urology;  Laterality: Right;   ENDOMETRIAL ABLATION     ESOPHAGOGASTRODUODENOSCOPY (EGD) WITH PROPOFOL N/A 08/16/2022   Procedure: ESOPHAGOGASTRODUODENOSCOPY (EGD) WITH PROPOFOL;  Surgeon: Toney Reil, MD;  Location: [facility]COPY;  Service: Gastroenterology;  Laterality: N/A;   EYE SURGERY     HEMORRHOID SURGERY  2011  INCONTINENCE SURGERY     INNER EAR SURGERY     X 2   RECTAL PROLAPSE REPAIR     RETINAL DETACHMENT SURGERY Bilateral 2000   TOTAL KNEE ARTHROPLASTY Left 04/29/2019   Procedure: LEFT TOTAL KNEE ARTHROPLASTY;  Surgeon: Kathryne Hitch, MD;  Location: [facility];  Service: Orthopedics;  Laterality: Left;   TUBAL LIGATION     VEIN SURGERY Left 04/2010   Past  Medical History:  Diagnosis Date   Allergy    Anal fissure    Anemia    borderline   Anxiety    Arthritis    Blood clot in vein    left leg   Detached retina    BOTH SCLERA BUCKLE BOTH EYES   DVT (deep venous thrombosis) (HCC)    left leg   Endometriosis    Family history of adverse reaction to anesthesia    DAUGHTER POST OP PONV   GERD (gastroesophageal reflux disease)    Glaucoma    RIGHT WORST THAN LEFT   Heart murmur    "caused by anxiety"    History of hemorrhoids    History of kidney stones    Hypertension    IBS (irritable bowel syndrome)    Perforated eardrum, right    SMALL HOLE   PONV (postoperative nausea and vomiting)    Pulmonary embolism (HCC) 6 YRS AGO   Status post arthroscopy of left knee 08/06/2017   BP 118/78   Pulse 80   Ht 5\' 7"  (1.702 m)   Wt 159 lb (72.1 kg)   LMP 10/11/2010   SpO2 98%   BMI 24.90 kg/m   Opioid Risk Score:   Fall Risk Score:  `1  Depression screen [facility] 2/9     10/24/2022    2:28 PM 10/11/2022   10:15 AM 08/25/2022   10:50 AM 07/11/2022   12:44 PM 03/28/2022   10:38 AM 10/12/2021   11:35 AM 08/02/2021   11:31 AM  Depression screen PHQ 2/9  Decreased Interest  2 2 1 1 3 1   Down, Depressed, Hopeless  1 1 1 1 1 1   PHQ - 2 Score  3 3 2 2 4 2   Altered sleeping 1 0 3   1   Tired, decreased energy 1 3 3   3    Change in appetite  2 0   0   Feeling bad or failure about yourself   0 0   0   Trouble concentrating  0 0   0   Moving slowly or fidgety/restless  0 0   0   Suicidal thoughts  0 0   0   PHQ-9 Score  8 9   8    Difficult doing work/chores  Very difficult Extremely dIfficult   Somewhat difficult    Review of Systems  Constitutional: Negative.        Weight loss  HENT: Negative.   Eyes: Negative.   Respiratory: Positive for shortness of breath.   Cardiovascular: Negative.   Gastrointestinal: Negative.   Endocrine: Negative.   Genitourinary: Negative.   Musculoskeletal: Positive for arthralgias, back pain and gait  problem.       Spasms  Skin: Negative.   Allergic/Immunologic: Negative.   Neurological: Positive for tremors and numbness.  Hematological: Negative.   Psychiatric/Behavioral:       Anxiety  All other systems reviewed and are negative.      Objective:  Gen: no distress, normal appearing, BMI 24.90, weight 159 lbs, BP  118/78 HEENT: oral mucosa pink and moist, NCAT Cardio: Reg rate Chest: normal effort, normal rate of breathing Abd: soft, non-distended Ext: no edema Psych: pleasant, normal affect Skin: intact Neuro: Alert and oriented x3 MSK: ambulates with cane  Assessment & Plan:  Sharon [name] is a 57 year old woman who presents for follow-up:   1) Left knee pain post-surgery: -She has a plan for repeat surgical procedure with Dr. Magnus Ivan that will hopefully provide relief.  -She has been icing regularly- recommended three tines per day for 15 minutes. Discussed that this decreases blood flow and can impair healing but help with swelling and pain relief.  -discussed the limitations of her pain on her function -discussed extracorporeal shockwave therapy and how this can help break apart scar tissue -unable to work due the level of her pain -discussed current impact on her life -continue cane for balance -She has been through physical therapy and performs her exercises diligently. --Discussed Sprint PNS system as an option of pain treatment via neuromodulation. Provided following link for patient to learn more about the system: https://www.sprtherapeutics.com/. She does not like the feeling of things under her skin and defers that option.  -She weaned herself all the steroids.  -Provided with a pain relief journal and discussed that it contains foods and lifestyle tips to naturally help to improve pain. Discussed that these lifestyle strategies are also very good for health unlike some medications which can have negative side effects. Discussed that the act of keeping a journal  can be therapeutic and helpful to realize patterns what helps to trigger and alleviate pain.   -discussed mechanism of action of low dose naltrexone as an opioid receptor antagonist which stimulates your body's production of its own natural endogenous opioids, helping to decrease pain. Discussed that it can also decrease T cell response and thus be helpful in decreasing inflammation, and symptoms of brain fog, fatigue, anxiety, depression, and allergies. Discussed that this medication needs to be compounded at a compounding pharmacy and can more expensive. Discussed that I usually start at 1mg  and if this is not providing enough relief then I titrate upward on a monthly basis.   -Discussed Qutenza as an option for neuropathic pain control. Discussed that this is a capsaicin patch, stronger than capsaicin cream. Discussed that it is currently approved for diabetic peripheral neuropathy and post-herpetic neuralgia, but that it has also shown benefit in treating other forms of neuropathy. Provided patient with link to site to learn more about the patch: https://www.clark.biz/. Discussed that the patch would be placed in office and benefits usually last 3 months. Discussed that unintended exposure to capsaicin can cause severe irritation of eyes, mucous membranes, respiratory tract, and skin, but that Qutenza is a local treatment and does not have the systemic side effects of other nerve medications. Discussed that there may be pain, itching, erythema, and decreased sensory function associated with the application of Qutenza. Side effects usually subside within 1 week. A cold pack of analgesic medications can help with these side effects. Blood pressure can also be increased due to pain associated with administration of the patch.  -continue ice     -Refilled Robaxin as she is nervous about feeling too sleepy with the Tizanidine, and the Robaxin does help.   Continue gabapentin to 600mg  TID PRN.   -Continue  neoprene knee sleeve for proprioception  -She has tried Celebrex and Tramadol without benefit. She had respiratory distress with hydrocodone.   -She had worse pain with steroid  injection in her left knee.   -Discussed steroids but she did not like the side effect profile. Discussed the different steroid options. She weaned off these because she developed mouth sores.   -Continue turmeric and cherries.   -Continue Lidocaine patches today.   -She would like rheum panel checked: ordered, reviewed results which were negative, and discussed with patient.  -Refilled meloxicam.   2) Bunion:  -continue orthotics  3) Irritable bowel syndrome: -She does not eat much sugar or sweets.  -She drinks 1 coke per day.  -She cuts tea out.  -She drinks water, coffee.  -She has been going more regularly and has not needed her Miralax.  -food allergy testing ordered  4) Impaired Concentration: - This persists -She forgets things easily.  -She tries not to multitask when she was with her grandmother.   5) Anxiety: The Gabapentin is helping with this as well, continue  6) Vulvodynia: -discussed various treatment options: biofeedback, medications, continue estradiol.  -continue Amitriptyline 10mg  HS, discussed moving dose earlier in the evening.   7) Cervical and lumbar myofascial pain: -try massage -discussed benefits of therapy and trigger point injections -apply lidocaine patch.   8) Bilateral hand pain: likely secondary to arthritis compounded by carpal tunnel syndrome -continue Gabapentin which is helping -continue f/u with surgery as planned.   9) Low back pain, appears to be secondary to face arthritis -discussed XR, but she defers due to potential cost -discussed surgery with an orthopedic and she would like to consider this but her insurance would not cover it.  -discussed lack of improvement with injections from Dr. Modesto Charon.  -continue lidocaine patches, discussed that these make  her feel weird when she takes them off.  - Ketamine 10%, Baclofen 2%, Cyclobenzaprine 2%, Ketoprofen 10%, Gabapentin 6%, Bupivacaine 1%, Amitryptiline 5%, clonidine 0.2%  Dispense 240 g  Apply 1-2 g to affected area 3-4 times per day  -continue postural correction -Provided with a pain relief journal and discussed that it contains foods and lifestyle tips to naturally help to improve pain. Discussed that these lifestyle strategies are also very good for health unlike some medications which can have negative side effects. Discussed that the act of keeping a journal can be therapeutic and helpful to realize patterns what helps to trigger and alleviate pain.   -Discussed following foods that may reduce pain: 1) Ginger (especially studied for arthritis)- reduce leukotriene production to decrease inflammation 2) Blueberries- high in phytonutrients that decrease inflammation 3) Salmon- marine omega-3s reduce joint swelling and pain 4) Pumpkin seeds- reduce inflammation 5) dark chocolate- reduces inflammation 6) turmeric- reduces inflammation 7) tart cherries - reduce pain and stiffness 8) extra virgin olive oil - its compound olecanthal helps to block prostaglandins  9) chili peppers- can be eaten or applied topically via capsaicin 10) mint- helpful for headache, muscle aches, joint pain, and itching 11) garlic- reduces inflammation  Link to further information on diet for chronic pain: http://www.bray.com/   10) shoulder pain -used to get shots in the pt, but laying on her back has helped to decrease her shoulder pain  11) Acid reflux -continues famotidine.   11) Soreness in legs -vascular ultrasound ordered of both legs -discussed her history of clots -discussed that a D-dimer test can suggest clot if elevated.   12) Irritable bowel syndrome -discussed her current diet -discussed that she does not eat breakfast  due to nausea -discussed her diarrhea and constipation, more diarrheal type at this time.  -discussed her aunt's advise  to have a tsp coconut oil daily.  -recommended bioptemizer's magnesium breakthrough -reviewed food allergies with her and made goal for 4 week elimination of foods with the highest responses  13) Insomnia/Shortness of breath with history of clots -discussed that there is no evidence of PE on her CTA -discussed her follow-up with PCP -discussed that her vascular ultrasound of her lower extremities shows a chronic left peroneal CVT  -recommended starting nattokinase to help thin the blood and lower her risk for clots, or to eat natto -discussed the importance of movement in clot prevention -referred for sleep study for shortness of breath  14) Impaired memory: -discussed her memory difficulties.   15) Hemorroids: Recommended Epsom salt baths  16) prediabetes - recommended avoiding processed foods/added sugars/bread/pasta/rice -recommended drinking a glass of water with either apple cide vinegar or lemon -recommended reading Wheat Belly and Grain Brain -recommended eating fruits, vegetables, yogurt, nuts, olive oil -discussed risks and benefits of metformin -discussed that repeat labs show normalization of HgbA1c! -discussed that she does not eat much anymore -encouraged getting enough protein  17) Transaminitis -recommended avoiding ultraprocessed foods -recommended avoiding/minimizing tylenol -recommended supplement NAC 600mg  BID  -recommended drinking dandelion and milk thistle teas -commended on stopping drinking soda products! -recommended avoiding processed creamer in coffee, continue drinking coffee -discussed that repeat CMP shows normalization of liver enzymes!  18) Right knee pain: -encouraged use of exercise bike to strengthen quadriceps tendon  19) Dry mouth: -stop amitriptyline  20) Benzodiazepine withdrawal: -discussed that this should be  weaned slowly  31) Restless leg syndrome:  -discussed requip, will prescribe   >40 minutes spent in discussion of her bilateral knee pain, discussed stopping amitriptyline since it is causing dry mouth, discussed compounded cream, called in to [facility], discussed that she feels, discussed that she feels like she has restless leg syndrome, discussed that her PCP referred her to an allergist, diiscussed that she was sick from a medicine from a UTI so she couldn't eat, provided refills

## 2022-10-24 NOTE — Patient Instructions (Signed)
Requip  ?

## 2022-10-31 NOTE — Addendum Note (Signed)
Addended by: Mort Sawyers on: 10/31/2022 01:30 PM   Modules accepted: Orders

## 2022-11-07 ENCOUNTER — Encounter (HOSPITAL_BASED_OUTPATIENT_CLINIC_OR_DEPARTMENT_OTHER): Payer: Medicare HMO | Admitting: Physical Medicine and Rehabilitation

## 2022-11-07 DIAGNOSIS — G4701 Insomnia due to medical condition: Secondary | ICD-10-CM

## 2022-11-07 MED ORDER — FAMOTIDINE 40 MG PO TABS: 40.0000 mg | ORAL_TABLET | Freq: Every day | ORAL | 1 refills | Status: DC

## 2022-11-07 MED ORDER — PANTOPRAZOLE SODIUM 20 MG PO TBEC: 20.0000 mg | DELAYED_RELEASE_TABLET | Freq: Every day | ORAL | 3 refills | Status: DC

## 2022-11-07 NOTE — Progress Notes (Signed)
Subjective:    Patient ID: Sharon [name], female    DOB: 1965/08/23, 57 y.o.   MRN: [id number]  HPI  An audio/video tele-health visit is felt to be the most appropriate encounter for this patient at this time. This is a follow up tele-visit via phone. The patient is at home. MD is at office. Prior to scheduling this appointment, our staff discussed the limitations of evaluation and management by telemedicine and the availability of in-person appointments. The patient expressed understanding and agreed to proceed.   Sharon [name] is a 57 year old woman who presents for f/u insomnia, bilateral knee pain following left knee replacement in 04/2019, transaminitis, shortness of breath, brain fog, and prediabetes.   1) Bilateral knee pain L>R: -right knee pain is getting worse -getting a new bike and she is hoping that strengthening her muscles will help -went to church a couple times but it hurt so badly. -right knee pain has been getting worse -Her ROM is still restricted in her left knee.  -ice helps -radiates between hip and left knee Feels like a charley horse -She asks whether the scar tissue will ever heal.  -Cannot sleep without a pillow underneath her legs.  -She has follow-up with Dr. Turner Daniels and Dr. Magnus Ivan -Currently is not planning to pursue surgery.  -She has been off hydrocodone.  -She would be interested in increasing the Gabapentin dose.  -She would like to discuss an option for inflammation. She has tried aspirations but the fluid comes back right away.  -She got a  Steroid injection and experienced worsening range of motion in her left knee- that was from Dr. Turner Daniels. He also pulled fluid off. It does help with pain, but she does not want another given her worsening range of motion afterward. She does have improved sensation in her knee after the injection! -has been hurting -discussed Qutenza as a treatment option -her current regimen is working for her.  -has been  having digestive issues and stopped meloxicam.   2) Anxiety: This has much improved with the Gabapentin.  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  -she has used blue emu oil and diclofenac gel without great benefit -has appointment with physician today.  -she does use her hands a lot.  -she has been dropping things recently.   9) lower back pain -has gotten worse -got Xrs and was shown to have cyst on right side and bulging disc on left side and pinched nerve in the middle.  -she feels a constant burning -worst on left side -feels she is walking crooked -she had an injection by Dr. Modesto Charon at orthopedics but these did not help.  -she know has a bone further up that is bothering her.  -left pain in her boy friend's car so had a hard time getting in today -hurts severely sometimes -pinches when she walks -sitting also hurts -she does not have insurance right now -ice helps -she uses heat for the muscular pain -hurt it while bending and turning -has been worse since she carried her grandchild -she is unable to vacuum.  -discussed therapy and using lidocaine patch, massage.  -she is ok using gabapentin and her muscle relaxers.  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.  -ready to try Qutenza today -has been severe recently -plans to follow-up with her orthopedic surgeon.   10) Constipation -has been needing to take laxatives.   11) shortness of breath -worried abut blot clots -she asks about results of her vascular ultrasound of her lower extremity and what treatment is necessary, how she would know if there are clots elsewhere in her body, and how she may have  developed these clots.  -she has being set up for tests  12) impaired memory  13) Prediabetes -she asks about HgbA1c test result  14) Transaminitis -she asks whether any of her medications could have caused this'  15) Food allergy -she asks about her food allergy results  16) Insomnia: -she has been trying to wean off her Klonopin but this makes it harder for her to fall asleep -the Requip caused dry mouth and she is hesitant about its potential side effects -she has not tried tart cherry juice or melatonin  Prior history:  Since last visit she has stopped Norco as it caused respiratory distress. We tried Cymbalta 40mg  without benefit or side effects. She has tried voltaren gel, blue emu oil without benefit. She has tried CBD.   She has been trying to improve quadriceps musculature. She discusses some of the exercises she does.   She has been denied in her disability. She has talked to an attorney.  She continues to ambulate with a cane.   She has tried the compounding cream and that does not help much. She has been trying Hemp cream which is helping.  She is getting foot insoles from the podiatrist. They should be ready in a week or two.   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   Can ride the stationary bicycle and that does not hurt. She sues it every day. She uses leg weights. When   Scar tissue grew back quickly post-surgery. She could not get PT appointments for 3 weeks.   Her surgeon does not want her to break the hardware and he may have to do arthroscopic procedure to flush out the inflammation.   She has tried ice which stopped helping, nabumetone 750 mg BID, Norco 1-2 tablets three times per day PRN (she has 12 left). She uses diclofenac gel   She takes Miralax for constipation. She takes up to 2 times per day. She has a history of IBS.   [facility] in  [place]- she was a Scientist, physiological. They let her go because she did not return in 12 weeks.   She was doing pool therapy but chemicals in the pool were increased because of COVID.  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 Notes reviewed- recently had a right subacromial injection.   Current pain is 8/10.   12) Hip pain   Pain Inventory Average Pain 8 Pain Right Now 8 My pain is constant, sharp, burning, dull, stabbing, and aching  In the last 24 hours, has pain interfered with the following? General activity 4 Relation with others 4 Enjoyment of life 4 What TIME of day is your pain at its worst? evening, daytime & night Sleep (in general) Fair  Pain is worse with: walking, bending, sitting, inactivity, and standing Pain improves with: rest, heat/ice, and medication Relief from Meds: 6     Family  History  Problem Relation Age of Onset   Emphysema Mother        smoker   Allergies Mother    Asthma Mother    Heart disease Mother        AMI as cause of death   COPD Mother    Asthma Father    Heart disease Father        AMI as cause of death   Diabetes Father    Asthma Brother    Heart disease Brother 56       heart failure   Lung cancer Maternal Grandfather        was a smoker   Cancer Maternal Grandfather        lung   Heart disease Paternal Grandmother        AMI   Heart disease Paternal Grandfather        AMI   Colon cancer Neg Hx    Social History   Socioeconomic History   Marital status: Divorced    Spouse name: Not on file   Number of children: 1   Years of education: Not on file   Highest education level: Not on file  Occupational History   Occupation: IT sales professional: [employer]  Tobacco Use   Smoking status: Never   Smokeless tobacco: Never  Vaping Use   Vaping Use: Never used  Substance and Sexual Activity   Alcohol use: Not Currently    Comment: occ   Drug use: No    Comment: former maryjuana  use- quit in 1995   Sexual activity: Yes    Partners: Male    Birth control/protection: Surgical  Other Topics Concern   Not on file  Social History Narrative   Marital status: divorced x 2; dating seriously x 3 years.        Children:  1 daughter (66); no grandchildren      Lives:  With boyfriend.        Employment: works at [employer]      Tobacco: none      Alcohol: rarely; socially      Exercise: none; has treadmill and elliptical and bikes   Coffee in am / 1/2 of coke in afternoon    High school education      01/29/20   From: the area   Living: alone - but boyfriend living with her Genelle Bal (2020) but knew each other in high school   Work: on disability - working on appeal      Family: one daughter Leonette Nutting - one grandchild coming in January       Enjoys: relax and watch tv, use to like fishing/boating/rafting, reading, crochet       Exercise: rehab exercises   Diet: not great - limited intake      Safety   Seat belts: Yes    Guns: Yes  and secure   Right handed   One story home   Drinks caffeine   Social Determinants of Health   Financial Resource Strain: Not on file  Food Insecurity: Not on file  Transportation Needs: Not on file  Physical Activity: Not on file  Stress: Not on file  Social Connections: Not on file   Past Surgical History:  Procedure Laterality Date   ABDOMINAL HYSTERECTOMY     still with both ovaries   CATARACT EXTRACTION Bilateral 2015   COLONOSCOPY     CYSTOSCOPY W/ URETERAL STENT PLACEMENT Right 05/23/2017  Procedure: CYSTOSCOPY WITH RETROGRADE PYELOGRAM/URETERAL RIGHT STENT PLACEMENT;  Surgeon: Alfredo Martinez, MD;  Location: [facility];  Service: Urology;  Laterality: Right;   CYSTOSCOPY W/ URETERAL STENT PLACEMENT Right 06/04/2017   Procedure: CYSTOSCOPY WITHRIGHT RETROGRADE PYELOGRAMRIGHT /URETEREOSCOPY  STENT PLACEMENT;  Surgeon: Crista Elliot, MD;  Location: [facility];  Service: Urology;  Laterality: Right;    ENDOMETRIAL ABLATION     ESOPHAGOGASTRODUODENOSCOPY (EGD) WITH PROPOFOL N/A 08/16/2022   Procedure: ESOPHAGOGASTRODUODENOSCOPY (EGD) WITH PROPOFOL;  Surgeon: Toney Reil, MD;  Location: [facility];  Service: Gastroenterology;  Laterality: N/A;   EYE SURGERY     HEMORRHOID SURGERY  2011   INCONTINENCE SURGERY     INNER EAR SURGERY     X 2   RECTAL PROLAPSE REPAIR     RETINAL DETACHMENT SURGERY Bilateral 2000   TOTAL KNEE ARTHROPLASTY Left 04/29/2019   Procedure: LEFT TOTAL KNEE ARTHROPLASTY;  Surgeon: Kathryne Hitch, MD;  Location: [facility];  Service: Orthopedics;  Laterality: Left;   TUBAL LIGATION     VEIN SURGERY Left 04/2010   Past Medical History:  Diagnosis Date   Allergy    Anal fissure    Anemia    borderline   Anxiety    Arthritis    Blood clot in vein    left leg   Detached retina    BOTH SCLERA BUCKLE BOTH EYES   DVT (deep venous thrombosis) (HCC)    left leg   Endometriosis    Family history of adverse reaction to anesthesia    DAUGHTER POST OP PONV   GERD (gastroesophageal reflux disease)    Glaucoma    RIGHT WORST THAN LEFT   Heart murmur    "caused by anxiety"    History of hemorrhoids    History of kidney stones    Hypertension    IBS (irritable bowel syndrome)    Perforated eardrum, right    SMALL HOLE   PONV (postoperative nausea and vomiting)    Pulmonary embolism (HCC) 6 YRS AGO   Status post arthroscopy of left knee 08/06/2017   LMP 10/11/2010   Opioid Risk Score:   Fall Risk Score:  `1  Depression screen [facility] 2/9     10/24/2022    2:28 PM 10/11/2022   10:15 AM 08/25/2022   10:50 AM 07/11/2022   12:44 PM 03/28/2022   10:38 AM 10/12/2021   11:35 AM 08/02/2021   11:31 AM  Depression screen PHQ 2/9  Decreased Interest  2 2 1 1 3 1   Down, Depressed, Hopeless  1 1 1 1 1 1   PHQ - 2 Score  3 3 2 2 4 2   Altered sleeping 1 0 3   1   Tired, decreased energy 1 3 3   3    Change in appetite  2 0   0   Feeling bad or failure about  yourself   0 0   0   Trouble concentrating  0 0   0   Moving slowly or fidgety/restless  0 0   0   Suicidal thoughts  0 0   0   PHQ-9 Score  8 9   8    Difficult doing work/chores  Very difficult Extremely dIfficult   Somewhat difficult    Review of Systems  Constitutional: Negative.        Weight loss  HENT: Negative.   Eyes: Negative.   Respiratory: Positive for shortness of breath.   Cardiovascular: Negative.  Gastrointestinal: Negative.   Endocrine: Negative.   Genitourinary: Negative.   Musculoskeletal: Positive for arthralgias, back pain and gait problem.       Spasms  Skin: Negative.   Allergic/Immunologic: Negative.   Neurological: Positive for tremors and numbness.  Hematological: Negative.   Psychiatric/Behavioral:       Anxiety  All other systems reviewed and are negative.      Objective:  Gen: no distress, normal appearing, BMI 24.90, weight 159 lbs, BP 118/78 HEENT: oral mucosa pink and moist, NCAT Cardio: Reg rate Chest: normal effort, normal rate of breathing Abd: soft, non-distended Ext: no edema Psych: pleasant, normal affect Skin: intact Neuro: Alert and oriented x3 MSK: ambulates with cane  Assessment & Plan:  Sharon [name] is a 57 year old woman who presents for follow-up:   1) Left knee pain post-surgery: -She has a plan for repeat surgical procedure with Dr. Magnus Ivan that will hopefully provide relief.  -She has been icing regularly- recommended three tines per day for 15 minutes. Discussed that this decreases blood flow and can impair healing but help with swelling and pain relief.  -discussed the limitations of her pain on her function -discussed extracorporeal shockwave therapy and how this can help break apart scar tissue -unable to work due the level of her pain -discussed current impact on her life -continue cane for balance -She has been through physical therapy and performs her exercises diligently. --Discussed Sprint PNS system as an  option of pain treatment via neuromodulation. Provided following link for patient to learn more about the system: https://www.sprtherapeutics.com/. She does not like the feeling of things under her skin and defers that option.  -She weaned herself all the steroids.  -Provided with a pain relief journal and discussed that it contains foods and lifestyle tips to naturally help to improve pain. Discussed that these lifestyle strategies are also very good for health unlike some medications which can have negative side effects. Discussed that the act of keeping a journal can be therapeutic and helpful to realize patterns what helps to trigger and alleviate pain.   -discussed mechanism of action of low dose naltrexone as an opioid receptor antagonist which stimulates your body's production of its own natural endogenous opioids, helping to decrease pain. Discussed that it can also decrease T cell response and thus be helpful in decreasing inflammation, and symptoms of brain fog, fatigue, anxiety, depression, and allergies. Discussed that this medication needs to be compounded at a compounding pharmacy and can more expensive. Discussed that I usually start at 1mg  and if this is not providing enough relief then I titrate upward on a monthly basis.   -Discussed Qutenza as an option for neuropathic pain control. Discussed that this is a capsaicin patch, stronger than capsaicin cream. Discussed that it is currently approved for diabetic peripheral neuropathy and post-herpetic neuralgia, but that it has also shown benefit in treating other forms of neuropathy. Provided patient with link to site to learn more about the patch: https://www.clark.biz/. Discussed that the patch would be placed in office and benefits usually last 3 months. Discussed that unintended exposure to capsaicin can cause severe irritation of eyes, mucous membranes, respiratory tract, and skin, but that Qutenza is a local treatment and does not have the  systemic side effects of other nerve medications. Discussed that there may be pain, itching, erythema, and decreased sensory function associated with the application of Qutenza. Side effects usually subside within 1 week. A cold pack of analgesic medications can help with  these side effects. Blood pressure can also be increased due to pain associated with administration of the patch.  -continue ice     -Refilled Robaxin as she is nervous about feeling too sleepy with the Tizanidine, and the Robaxin does help.   Continue gabapentin to 600mg  TID PRN.   -Continue neoprene knee sleeve for proprioception  -She has tried Celebrex and Tramadol without benefit. She had respiratory distress with hydrocodone.   -She had worse pain with steroid injection in her left knee.   -Discussed steroids but she did not like the side effect profile. Discussed the different steroid options. She weaned off these because she developed mouth sores.   -Continue turmeric and cherries.   -Continue Lidocaine patches today.   -She would like rheum panel checked: ordered, reviewed results which were negative, and discussed with patient.  -Refilled meloxicam.   2) Bunion:  -continue orthotics  3) Irritable bowel syndrome: -She does not eat much sugar or sweets.  -She drinks 1 coke per day.  -She cuts tea out.  -She drinks water, coffee.  -She has been going more regularly and has not needed her Miralax.  -food allergy testing ordered  4) Impaired Concentration: - This persists -She forgets things easily.  -She tries not to multitask when she was with her grandmother.   5) Anxiety: The Gabapentin is helping with this as well, continue  6) Vulvodynia: -discussed various treatment options: biofeedback, medications, continue estradiol.  -continue Amitriptyline 10mg  HS, discussed moving dose earlier in the evening.   7) Cervical and lumbar myofascial pain: -try massage -discussed benefits of therapy and  trigger point injections -apply lidocaine patch.   8) Bilateral hand pain: likely secondary to arthritis compounded by carpal tunnel syndrome -continue Gabapentin which is helping -continue f/u with surgery as planned.   9) Low back pain, appears to be secondary to face arthritis -discussed XR, but she defers due to potential cost -discussed surgery with an orthopedic and she would like to consider this but her insurance would not cover it.  -discussed lack of improvement with injections from Dr. Modesto Charon.  -continue lidocaine patches, discussed that these make her feel weird when she takes them off.  - Ketamine 10%, Baclofen 2%, Cyclobenzaprine 2%, Ketoprofen 10%, Gabapentin 6%, Bupivacaine 1%, Amitryptiline 5%, clonidine 0.2%  Dispense 240 g  Apply 1-2 g to affected area 3-4 times per day  -continue postural correction -Provided with a pain relief journal and discussed that it contains foods and lifestyle tips to naturally help to improve pain. Discussed that these lifestyle strategies are also very good for health unlike some medications which can have negative side effects. Discussed that the act of keeping a journal can be therapeutic and helpful to realize patterns what helps to trigger and alleviate pain.   -Discussed following foods that may reduce pain: 1) Ginger (especially studied for arthritis)- reduce leukotriene production to decrease inflammation 2) Blueberries- high in phytonutrients that decrease inflammation 3) Salmon- marine omega-3s reduce joint swelling and pain 4) Pumpkin seeds- reduce inflammation 5) dark chocolate- reduces inflammation 6) turmeric- reduces inflammation 7) tart cherries - reduce pain and stiffness 8) extra virgin olive oil - its compound olecanthal helps to block prostaglandins  9) chili peppers- can be eaten or applied topically via capsaicin 10) mint- helpful for headache, muscle aches, joint pain, and itching 11) garlic- reduces  inflammation  Link to further information on diet for chronic pain: http://www.bray.com/   10) shoulder pain -used to get shots in the  pt, but laying on her back has helped to decrease her shoulder pain  11) Acid reflux -continues famotidine.   11) Soreness in legs -vascular ultrasound ordered of both legs -discussed her history of clots -discussed that a D-dimer test can suggest clot if elevated.   12) Irritable bowel syndrome -discussed her current diet -discussed that she does not eat breakfast due to nausea -discussed her diarrhea and constipation, more diarrheal type at this time.  -discussed her aunt's advise to have a tsp coconut oil daily.  -recommended bioptemizer's magnesium breakthrough -reviewed food allergies with her and made goal for 4 week elimination of foods with the highest responses  13) Insomnia/Shortness of breath with history of clots -discussed that there is no evidence of PE on her CTA -discussed her follow-up with PCP -discussed that her vascular ultrasound of her lower extremities shows a chronic left peroneal CVT  -recommended starting nattokinase to help thin the blood and lower her risk for clots, or to eat natto -discussed the importance of movement in clot prevention -referred for sleep study for shortness of breath  14) Impaired memory: -discussed her memory difficulties.   15) Hemorroids: Recommended Epsom salt baths  16) prediabetes - recommended avoiding processed foods/added sugars/bread/pasta/rice -recommended drinking a glass of water with either apple cide vinegar or lemon -recommended reading Wheat Belly and Grain Brain -recommended eating fruits, vegetables, yogurt, nuts, olive oil -discussed risks and benefits of metformin -discussed that repeat labs show normalization of HgbA1c! -discussed that she does not eat much anymore -encouraged getting enough  protein  17) Transaminitis -recommended avoiding ultraprocessed foods -recommended avoiding/minimizing tylenol -recommended supplement NAC 600mg  BID  -recommended drinking dandelion and milk thistle teas -commended on stopping drinking soda products! -recommended avoiding processed creamer in coffee, continue drinking coffee -discussed that repeat CMP shows normalization of liver enzymes!  18) Right knee pain: -encouraged use of exercise bike to strengthen quadriceps tendon  19) Dry mouth: -stop amitriptyline  20) Benzodiazepine withdrawal: -discussed that this should be weaned slowly  31) Restless leg syndrome:  -discussed requip, will prescribe  32) Insomnia: -discussed that she has been on Klonopin for 12 years and is trying to wean off as her PCP is concerned it may be contributing to her brain fog -discussed that Klonopin can contribute to memory decline and commended her for trying to wean off -discussed that she has been having dry mouth and she is not sure if it is from the Requip. She has stopped taking this in case and dry mouth has continued -discussed that robaxin can also cause dry mouth -discussed taking muscle relaxer earlier in the evening so she does not have to wake at night to drink water  40 minutes spent in discussion of her insomnia as she has been weaning off the klonopin she has been on the past 12 years, discussed that Klonopin can contribute to memory decline and commended her for trying to wean off, discussed that she has been having dry mouth and she is not sure if it is from the Requip, and that has stopped taking this in case it was the cause but that her dry mouth has continued, discussed that robaxin can also cause dry mouth, discussed taking her muscle relaxer earlier in the evening so that she does not have to wake at night to drink water

## 2022-11-08 ENCOUNTER — Encounter (HOSPITAL_BASED_OUTPATIENT_CLINIC_OR_DEPARTMENT_OTHER): Payer: Medicare HMO | Admitting: Physical Medicine and Rehabilitation

## 2022-11-08 DIAGNOSIS — R413 Other amnesia: Secondary | ICD-10-CM

## 2022-11-08 NOTE — Progress Notes (Signed)
Subjective:    Patient ID: Sharon [name], female    DOB: 1966/06/08, 57 y.o.   MRN: [id number]  HPI  An audio/video tele-health visit is felt to be the most appropriate encounter for this patient at this time. This is a follow up tele-visit via phone. The patient is at home. MD is at office. Prior to scheduling this appointment, our staff discussed the limitations of evaluation and management by telemedicine and the availability of in-person appointments. The patient expressed understanding and agreed to proceed.   Sharon [name] is a 57 year old woman who presents for f/u insomnia, bilateral knee pain following left knee replacement in 04/2019, transaminitis, shortness of breath, brain fog, and prediabetes.   1) Bilateral knee pain L>R: -right knee pain is getting worse -getting a new bike and she is hoping that strengthening her muscles will help -went to church a couple times but it hurt so badly. -right knee pain has been getting worse -Her ROM is still restricted in her left knee.  -ice helps -radiates between hip and left knee Feels like a charley horse -She asks whether the scar tissue will ever heal.  -Cannot sleep without a pillow underneath her legs.  -She has follow-up with Dr. Turner Daniels and Dr. Magnus Ivan -Currently is not planning to pursue surgery.  -She has been off hydrocodone.  -She would be interested in increasing the Gabapentin dose.  -She would like to discuss an option for inflammation. She has tried aspirations but the fluid comes back right away.  -She got a  Steroid injection and experienced worsening range of motion in her left knee- that was from Dr. Turner Daniels. He also pulled fluid off. It does help with pain, but she does not want another given her worsening range of motion afterward. She does have improved sensation in her knee after the injection! -has been hurting -discussed Qutenza as a treatment option -her current regimen is working for her.  -has been  having digestive issues and stopped meloxicam.   2) Anxiety: This has much improved with the Gabapentin.  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  -she has used blue emu oil and diclofenac gel without great benefit -has appointment with physician today.  -she does use her hands a lot.  -she has been dropping things recently.   9) lower back pain -has gotten worse -got Xrs and was shown to have cyst on right side and bulging disc on left side and pinched nerve in the middle.  -she feels a constant burning -worst on left side -feels she is walking crooked -she had an injection by Dr. Modesto Charon at orthopedics but these did not help.  -she know has a bone further up that is bothering her.  -left pain in her boy friend's car so had a hard time getting in today -hurts severely sometimes -pinches when she walks -sitting also hurts -she does not have insurance right now -ice helps -she uses heat for the muscular pain -hurt it while bending and turning -has been worse since she carried her grandchild -she is unable to vacuum.  -discussed therapy and using lidocaine patch, massage.  -she is ok using gabapentin and her muscle  relaxers.  -found lidocaine patch to be a lifesaver while she was at the beach.  -ready to try Qutenza today -has been severe recently -plans to follow-up with her orthopedic surgeon.   10) Constipation -has been needing to take laxatives.   11) shortness of breath -worried abut blot clots -she asks about results of her vascular ultrasound of her lower extremity and what treatment is necessary, how she would know if there are clots elsewhere in her body, and how she  may have developed these clots.  -she has being set up for tests  12) impaired memory  13) Prediabetes -she asks about HgbA1c test result  14) Transaminitis -she asks whether any of her medications could have caused this'  15) Food allergy -she asks about her food allergy results  16) Insomnia: -she has been trying to wean off her Klonopin but this makes it harder for her to fall asleep -the Requip caused dry mouth and she is hesitant about its potential side effects -she has not tried tart cherry juice or melatonin  Prior history:  Since last visit she has stopped Norco as it caused respiratory distress. We tried Cymbalta 40mg  without benefit or side effects. She has tried voltaren gel, blue emu oil without benefit. She has tried CBD.   She has been trying to improve quadriceps musculature. She discusses some of the exercises she does.   She has been denied in her disability. She has talked to an attorney.  She continues to ambulate with a cane.   She has tried the compounding cream and that does not help much. She has been trying Hemp cream which is helping.  She is getting foot insoles from the podiatrist. They should be ready in a week or two.   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   Can ride the stationary bicycle and that does not hurt. She sues it every day. She uses leg weights. When   Scar tissue grew back quickly post-surgery. She could not get PT appointments for 3 weeks.   Her surgeon does not want her to break the hardware and he may have to do arthroscopic procedure to flush out the inflammation.   She has tried ice which stopped helping, nabumetone 750 mg BID, Norco 1-2 tablets three times per day PRN (she has 12 left). She uses diclofenac gel   She takes Miralax for constipation. She takes up to 2 times per day. She has a history of IBS.   Friend's  Home in [place]- she was a Scientist, physiological. They let her go because she did not return in 12 weeks.   She was doing pool therapy but chemicals in the pool were increased because of COVID.  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 Notes reviewed- recently had a right subacromial injection.   Current pain is 8/10.   12) Hip pain   Pain Inventory Average Pain 8 Pain Right Now 8 My pain is constant, sharp, burning, dull, stabbing, and aching  In the last 24 hours, has pain interfered with the following? General activity 4 Relation with others 4 Enjoyment of life 4 What TIME of day is your pain at its worst? evening, daytime & night Sleep (in general) Fair  Pain is worse with: walking, bending, sitting, inactivity, and standing Pain improves with: rest, heat/ice, and medication Relief from Meds: 6  Family History  Problem Relation Age of Onset   Emphysema Mother        smoker   Allergies Mother    Asthma Mother    Heart disease Mother        AMI as cause of death   COPD Mother    Asthma Father    Heart disease Father        AMI as cause of death   Diabetes Father    Asthma Brother    Heart disease Brother 33       heart failure   Lung cancer Maternal Grandfather        was a smoker   Cancer Maternal Grandfather        lung   Heart disease Paternal Grandmother        AMI   Heart disease Paternal Grandfather        AMI   Colon cancer Neg Hx    Social History   Socioeconomic History   Marital status: Divorced    Spouse name: Not on file   Number of children: 1   Years of education: Not on file   Highest education level: Not on file  Occupational History   Occupation: IT sales professional: [employer]  Tobacco Use   Smoking status: Never   Smokeless tobacco: Never  Vaping Use   Vaping Use: Never used  Substance and Sexual Activity   Alcohol use: Not Currently    Comment: occ   Drug use: No    Comment: former  maryjuana use- quit in 1995   Sexual activity: Yes    Partners: Male    Birth control/protection: Surgical  Other Topics Concern   Not on file  Social History Narrative   Marital status: divorced x 2; dating seriously x 3 years.        Children:  1 daughter (57); no grandchildren      Lives:  With boyfriend.        Employment: works at [employer]      Tobacco: none      Alcohol: rarely; socially      Exercise: none; has treadmill and elliptical and bikes   Coffee in am / 1/2 of coke in afternoon    High school education      01/29/20   From: the area   Living: alone - but boyfriend living with her Genelle Bal (2020) but knew each other in high school   Work: on disability - working on appeal      Family: one daughter Leonette Nutting - one grandchild coming in January       Enjoys: relax and watch tv, use to like fishing/boating/rafting, reading, crochet       Exercise: rehab exercises   Diet: not great - limited intake      Safety   Seat belts: Yes    Guns: Yes  and secure   Right handed   One story home   Drinks caffeine   Social Determinants of Health   Financial Resource Strain: Not on file  Food Insecurity: Not on file  Transportation Needs: Not on file  Physical Activity: Not on file  Stress: Not on file  Social Connections: Not on file   Past Surgical History:  Procedure Laterality Date   ABDOMINAL HYSTERECTOMY     still with both ovaries   CATARACT EXTRACTION Bilateral 2015   COLONOSCOPY     CYSTOSCOPY W/ URETERAL STENT PLACEMENT Right  05/23/2017   Procedure: CYSTOSCOPY WITH RETROGRADE PYELOGRAM/URETERAL RIGHT STENT PLACEMENT;  Surgeon: Alfredo Martinez, MD;  Location: [facility];  Service: Urology;  Laterality: Right;   CYSTOSCOPY W/ URETERAL STENT PLACEMENT Right 06/04/2017   Procedure: CYSTOSCOPY WITHRIGHT RETROGRADE PYELOGRAMRIGHT /URETEREOSCOPY  STENT PLACEMENT;  Surgeon: Crista Elliot, MD;  [facility]  Service: Urology;  Laterality:  Right;   ENDOMETRIAL ABLATION     ESOPHAGOGASTRODUODENOSCOPY (EGD) WITH PROPOFOL N/A 08/16/2022   Procedure: ESOPHAGOGASTRODUODENOSCOPY (EGD) WITH PROPOFOL;  Surgeon: Toney Reil, MD;  Location: [facility];  Service: Gastroenterology;  Laterality: N/A;   EYE SURGERY     HEMORRHOID SURGERY  2011   INCONTINENCE SURGERY     INNER EAR SURGERY     X 2   RECTAL PROLAPSE REPAIR     RETINAL DETACHMENT SURGERY Bilateral 2000   TOTAL KNEE ARTHROPLASTY Left 04/29/2019   Procedure: LEFT TOTAL KNEE ARTHROPLASTY;  Surgeon: Kathryne Hitch, MD;  Location: [facility];  Service: Orthopedics;  Laterality: Left;   TUBAL LIGATION     VEIN SURGERY Left 04/2010   Past Medical History:  Diagnosis Date   Allergy    Anal fissure    Anemia    borderline   Anxiety    Arthritis    Blood clot in vein    left leg   Detached retina    BOTH SCLERA BUCKLE BOTH EYES   DVT (deep venous thrombosis) (HCC)    left leg   Endometriosis    Family history of adverse reaction to anesthesia    DAUGHTER POST OP PONV   GERD (gastroesophageal reflux disease)    Glaucoma    RIGHT WORST THAN LEFT   Heart murmur    "caused by anxiety"    History of hemorrhoids    History of kidney stones    Hypertension    IBS (irritable bowel syndrome)    Perforated eardrum, right    SMALL HOLE   PONV (postoperative nausea and vomiting)    Pulmonary embolism (HCC) 6 YRS AGO   Status post arthroscopy of left knee 08/06/2017   LMP 10/11/2010   Opioid Risk Score:   Fall Risk Score:  `1  Depression screen [facility] 2/9     10/24/2022    2:28 PM 10/11/2022   10:15 AM 08/25/2022   10:50 AM 07/11/2022   12:44 PM 03/28/2022   10:38 AM 10/12/2021   11:35 AM 08/02/2021   11:31 AM  Depression screen PHQ 2/9  Decreased Interest  2 2 1 1 3 1   Down, Depressed, Hopeless  1 1 1 1 1 1   PHQ - 2 Score  3 3 2 2 4 2   Altered sleeping 1 0 3   1   Tired, decreased energy 1 3 3   3    Change in appetite  2 0   0   Feeling bad or failure  about yourself   0 0   0   Trouble concentrating  0 0   0   Moving slowly or fidgety/restless  0 0   0   Suicidal thoughts  0 0   0   PHQ-9 Score  8 9   8    Difficult doing work/chores  Very difficult Extremely dIfficult   Somewhat difficult    Review of Systems  Constitutional: Negative.        Weight loss  HENT: Negative.   Eyes: Negative.   Respiratory: Positive for shortness of breath.   Cardiovascular:  Negative.   Gastrointestinal: Negative.   Endocrine: Negative.   Genitourinary: Negative.   Musculoskeletal: Positive for arthralgias, back pain and gait problem.       Spasms  Skin: Negative.   Allergic/Immunologic: Negative.   Neurological: Positive for tremors and numbness.  Hematological: Negative.   Psychiatric/Behavioral:       Anxiety  All other systems reviewed and are negative.      Objective:  Gen: no distress, normal appearing, BMI 24.90, weight 159 lbs, BP 118/78 HEENT: oral mucosa pink and moist, NCAT Cardio: Reg rate Chest: normal effort, normal rate of breathing Abd: soft, non-distended Ext: no edema Psych: pleasant, normal affect Skin: intact Neuro: Alert and oriented x3 MSK: ambulates with cane  Assessment & Plan:  Sharon [name] is a 57 year old woman who presents for follow-up:   1) Left knee pain post-surgery: -She has a plan for repeat surgical procedure with Dr. Magnus Ivan that will hopefully provide relief.  -She has been icing regularly- recommended three tines per day for 15 minutes. Discussed that this decreases blood flow and can impair healing but help with swelling and pain relief.  -discussed the limitations of her pain on her function -discussed extracorporeal shockwave therapy and how this can help break apart scar tissue -unable to work due the level of her pain -discussed current impact on her life -continue cane for balance -She has been through physical therapy and performs her exercises diligently. --Discussed Sprint PNS system  as an option of pain treatment via neuromodulation. Provided following link for patient to learn more about the system: https://www.sprtherapeutics.com/. She does not like the feeling of things under her skin and defers that option.  -She weaned herself all the steroids.  -Provided with a pain relief journal and discussed that it contains foods and lifestyle tips to naturally help to improve pain. Discussed that these lifestyle strategies are also very good for health unlike some medications which can have negative side effects. Discussed that the act of keeping a journal can be therapeutic and helpful to realize patterns what helps to trigger and alleviate pain.   -discussed mechanism of action of low dose naltrexone as an opioid receptor antagonist which stimulates your body's production of its own natural endogenous opioids, helping to decrease pain. Discussed that it can also decrease T cell response and thus be helpful in decreasing inflammation, and symptoms of brain fog, fatigue, anxiety, depression, and allergies. Discussed that this medication needs to be compounded at a compounding pharmacy and can more expensive. Discussed that I usually start at 1mg  and if this is not providing enough relief then I titrate upward on a monthly basis.   -Discussed Qutenza as an option for neuropathic pain control. Discussed that this is a capsaicin patch, stronger than capsaicin cream. Discussed that it is currently approved for diabetic peripheral neuropathy and post-herpetic neuralgia, but that it has also shown benefit in treating other forms of neuropathy. Provided patient with link to site to learn more about the patch: https://www.clark.biz/. Discussed that the patch would be placed in office and benefits usually last 3 months. Discussed that unintended exposure to capsaicin can cause severe irritation of eyes, mucous membranes, respiratory tract, and skin, but that Qutenza is a local treatment and does not  have the systemic side effects of other nerve medications. Discussed that there may be pain, itching, erythema, and decreased sensory function associated with the application of Qutenza. Side effects usually subside within 1 week. A cold pack of analgesic medications  can help with these side effects. Blood pressure can also be increased due to pain associated with administration of the patch.  -continue ice     -Refilled Robaxin as she is nervous about feeling too sleepy with the Tizanidine, and the Robaxin does help.   Continue gabapentin to 600mg  TID PRN.   -Continue neoprene knee sleeve for proprioception  -She has tried Celebrex and Tramadol without benefit. She had respiratory distress with hydrocodone.   -She had worse pain with steroid injection in her left knee.   -Discussed steroids but she did not like the side effect profile. Discussed the different steroid options. She weaned off these because she developed mouth sores.   -Continue turmeric and cherries.   -Continue Lidocaine patches today.   -She would like rheum panel checked: ordered, reviewed results which were negative, and discussed with patient.  -Refilled meloxicam.   2) Bunion:  -continue orthotics  3) Irritable bowel syndrome: -She does not eat much sugar or sweets.  -She drinks 1 coke per day.  -She cuts tea out.  -She drinks water, coffee.  -She has been going more regularly and has not needed her Miralax.  -food allergy testing ordered  4) Impaired Concentration: - This persists -She forgets things easily.  -She tries not to multitask when she was with her grandmother.   5) Anxiety: The Gabapentin is helping with this as well, continue  6) Vulvodynia: -discussed various treatment options: biofeedback, medications, continue estradiol.  -continue Amitriptyline 10mg  HS, discussed moving dose earlier in the evening.   7) Cervical and lumbar myofascial pain: -try massage -discussed benefits of  therapy and trigger point injections -apply lidocaine patch.   8) Bilateral hand pain: likely secondary to arthritis compounded by carpal tunnel syndrome -continue Gabapentin which is helping -continue f/u with surgery as planned.   9) Low back pain, appears to be secondary to face arthritis -discussed XR, but she defers due to potential cost -discussed surgery with an orthopedic and she would like to consider this but her insurance would not cover it.  -discussed lack of improvement with injections from Dr. Modesto Charon.  -continue lidocaine patches, discussed that these make her feel weird when she takes them off.  - Ketamine 10%, Baclofen 2%, Cyclobenzaprine 2%, Ketoprofen 10%, Gabapentin 6%, Bupivacaine 1%, Amitryptiline 5%, clonidine 0.2%  Dispense 240 g  Apply 1-2 g to affected area 3-4 times per day  -continue postural correction -Provided with a pain relief journal and discussed that it contains foods and lifestyle tips to naturally help to improve pain. Discussed that these lifestyle strategies are also very good for health unlike some medications which can have negative side effects. Discussed that the act of keeping a journal can be therapeutic and helpful to realize patterns what helps to trigger and alleviate pain.   -Discussed following foods that may reduce pain: 1) Ginger (especially studied for arthritis)- reduce leukotriene production to decrease inflammation 2) Blueberries- high in phytonutrients that decrease inflammation 3) Salmon- marine omega-3s reduce joint swelling and pain 4) Pumpkin seeds- reduce inflammation 5) dark chocolate- reduces inflammation 6) turmeric- reduces inflammation 7) tart cherries - reduce pain and stiffness 8) extra virgin olive oil - its compound olecanthal helps to block prostaglandins  9) chili peppers- can be eaten or applied topically via capsaicin 10) mint- helpful for headache, muscle aches, joint pain, and itching 11) garlic- reduces  inflammation  Link to further information on diet for chronic pain: http://www.bray.com/   10) shoulder pain -used to get  shots in the pt, but laying on her back has helped to decrease her shoulder pain  11) Acid reflux -continues famotidine.   11) Soreness in legs -vascular ultrasound ordered of both legs -discussed her history of clots -discussed that a D-dimer test can suggest clot if elevated.   12) Irritable bowel syndrome -discussed her current diet -discussed that she does not eat breakfast due to nausea -discussed her diarrhea and constipation, more diarrheal type at this time.  -discussed her aunt's advise to have a tsp coconut oil daily.  -recommended bioptemizer's magnesium breakthrough -reviewed food allergies with her and made goal for 4 week elimination of foods with the highest responses  13) Insomnia/Shortness of breath with history of clots -discussed that there is no evidence of PE on her CTA -discussed her follow-up with PCP -discussed that her vascular ultrasound of her lower extremities shows a chronic left peroneal CVT  -recommended starting nattokinase to help thin the blood and lower her risk for clots, or to eat natto -discussed the importance of movement in clot prevention -referred for sleep study for shortness of breath  14) Impaired memory: -discussed her memory difficulties. -discussed that some studies have linked chronic PPI use to memory impairment -discussed safe way to wean off these medications slowly  15) Hemorroids: Recommended Epsom salt baths  16) prediabetes - recommended avoiding processed foods/added sugars/bread/pasta/rice -recommended drinking a glass of water with either apple cide vinegar or lemon -recommended reading Wheat Belly and Grain Brain -recommended eating fruits, vegetables, yogurt, nuts, olive oil -discussed risks and benefits of  metformin -discussed that repeat labs show normalization of HgbA1c! -discussed that she does not eat much anymore -encouraged getting enough protein  17) Transaminitis -recommended avoiding ultraprocessed foods -recommended avoiding/minimizing tylenol -recommended supplement NAC 600mg  BID  -recommended drinking dandelion and milk thistle teas -commended on stopping drinking soda products! -recommended avoiding processed creamer in coffee, continue drinking coffee -discussed that repeat CMP shows normalization of liver enzymes!  18) Right knee pain: -encouraged use of exercise bike to strengthen quadriceps tendon  19) Dry mouth: -stop amitriptyline  20) Benzodiazepine withdrawal: -discussed that this should be weaned slowly  31) Restless leg syndrome:  -discussed requip, will prescribe  32) Insomnia: -discussed that she has been on Klonopin for 12 years and is trying to wean off as her PCP is concerned it may be contributing to her brain fog -discussed that Klonopin can contribute to memory decline and commended her for trying to wean off -discussed that she has been having dry mouth and she is not sure if it is from the Requip. She has stopped taking this in case and dry mouth has continued -discussed that robaxin can also cause dry mouth -discussed taking muscle relaxer earlier in the evening so she does not have to wake at night to drink water  9 minutes spent in discussion of safe ways to wean off PPI

## 2022-11-13 DIAGNOSIS — G4733 Obstructive sleep apnea (adult) (pediatric): Secondary | ICD-10-CM | POA: Diagnosis not present

## 2022-11-14 DIAGNOSIS — G4733 Obstructive sleep apnea (adult) (pediatric): Secondary | ICD-10-CM | POA: Diagnosis not present

## 2022-11-16 ENCOUNTER — Encounter (HOSPITAL_BASED_OUTPATIENT_CLINIC_OR_DEPARTMENT_OTHER): Payer: Medicare HMO | Admitting: Physical Medicine and Rehabilitation

## 2022-11-16 DIAGNOSIS — R079 Chest pain, unspecified: Secondary | ICD-10-CM | POA: Diagnosis not present

## 2022-11-16 DIAGNOSIS — M25562 Pain in left knee: Secondary | ICD-10-CM

## 2022-11-16 DIAGNOSIS — F411 Generalized anxiety disorder: Secondary | ICD-10-CM

## 2022-11-16 DIAGNOSIS — M25561 Pain in right knee: Secondary | ICD-10-CM | POA: Diagnosis not present

## 2022-11-16 MED ORDER — NITROGLYCERIN 0.4 MG SL SUBL: 0.4000 mg | SUBLINGUAL_TABLET | SUBLINGUAL | 12 refills | Status: AC | PRN

## 2022-11-16 NOTE — Progress Notes (Signed)
Subjective:    Patient ID: Sharon [name], female    DOB: March 02, 1966, 57 y.o.   MRN: [id number]  HPI  An audio/video tele-health visit is felt to be the most appropriate encounter for this patient at this time. This is a follow up tele-visit via phone. The patient is at home. MD is at office. Prior to scheduling this appointment, our staff discussed the limitations of evaluation and management by telemedicine and the availability of in-person appointments. The patient expressed understanding and agreed to proceed.   Sharon [name] is a 57 year old woman who presents for f/u insomnia, bilateral knee pain following left knee replacement in 04/2019, transaminitis, shortness of breath, brain fog, and prediabetes.   1) Bilateral knee pain L>R: -right knee pain is getting worse -getting a new bike and she is hoping that strengthening her muscles will help -went to church a couple times but it hurt so badly. -right knee pain has been getting worse -Her ROM is still restricted in her left knee.  -ice helps -radiates between hip and left knee Feels like a charley horse -She asks whether the scar tissue will ever heal.  -Cannot sleep without a pillow underneath her legs.  -She has follow-up with Dr. Turner Daniels and Dr. Magnus Ivan -Currently is not planning to pursue surgery.  -She has been off hydrocodone.  -She would be interested in increasing the Gabapentin dose.  -She would like to discuss an option for inflammation. She has tried aspirations but the fluid comes back right away.  -She got a  Steroid injection and experienced worsening range of motion in her left knee- that was from Dr. Turner Daniels. He also pulled fluid off. It does help with pain, but she does not want another given her worsening range of motion afterward. She does have improved sensation in her knee after the injection! -has been hurting -discussed Qutenza as a treatment option -her current regimen is working for her.  -has been  having digestive issues and stopped meloxicam.  -pain has been severe lately -she has decreased her robaxin to daily but this has not improved her dry mouth  2) Anxiety: This has much improved with the Gabapentin. 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  -she has used blue emu oil and diclofenac gel without great benefit -has appointment with physician today.  -she does use her hands a lot.  -she has been dropping things recently.   9) lower back pain -has gotten worse -got Xrs and was shown to have cyst on right side and bulging disc on left side and pinched nerve in the middle.  -she feels a constant burning -worst on left side -feels she is walking crooked -she had an injection by Dr. Modesto Charon at orthopedics but these did not help.  -she know has a bone further up that is bothering her.  -left pain in her boy friend's car so had a hard time getting in today -hurts severely sometimes -pinches when she walks -sitting also hurts -she does not have insurance right now -ice helps -she uses heat for the muscular pain -hurt it while  bending and turning -has been worse since she carried her grandchild -she is unable to vacuum.  -discussed therapy and using lidocaine patch, massage.  -she is ok using gabapentin and her muscle relaxers.  -found lidocaine patch to be a lifesaver while she was at the beach.  -ready to try Qutenza today -has been severe recently -plans to follow-up with her orthopedic surgeon.   10) Constipation -has been needing to take laxatives.   11) shortness of breath -worried abut blot  clots -she asks about results of her vascular ultrasound of her lower extremity and what treatment is necessary, how she would know if there are clots elsewhere in her body, and how she may have developed these clots.  -she has being set up for tests  12) impaired memory  13) Prediabetes -she asks about HgbA1c test result  14) Transaminitis -she asks whether any of her medications could have caused this'  15) Food allergy -she asks about her food allergy results  16) Insomnia: -she has been trying to wean off her Klonopin but this makes it harder for her to fall asleep -the Requip caused dry mouth and she is hesitant about its potential side effects -she has not tried tart cherry juice or melatonin  17) Chest pain -has been radiating to left arm -she follows with a cardiologist Prior history:  Since last visit she has stopped Norco as it caused respiratory distress. We tried Cymbalta 40mg  without benefit or side effects. She has tried voltaren gel, blue emu oil without benefit. She has tried CBD.   She has been trying to improve quadriceps musculature. She discusses some of the exercises she does.   She has been denied in her disability. She has talked to an attorney.  She continues to ambulate with a cane.   She has tried the compounding cream and that does not help much. She has been trying Hemp cream which is helping.  She is getting foot insoles from the podiatrist. They should be ready in a week or two.   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   Can ride the stationary bicycle and that does not hurt. She sues it every day. She uses leg weights. When   Scar tissue grew back quickly post-surgery. She could not get PT appointments for 3 weeks.   Her surgeon does not want her to break the hardware and he may have to do arthroscopic procedure to flush out the  inflammation.   She has tried ice which stopped helping, nabumetone 750 mg BID, Norco 1-2 tablets three times per day PRN (she has 12 left). She uses diclofenac gel   She takes Miralax for constipation. She takes up to 2 times per day. She has a history of IBS.   [facility] in [place]- she was a Scientist, physiological. They let her go because she did not return in 12 weeks.   She was doing pool therapy but chemicals in the pool were increased because of COVID.  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 Notes reviewed- recently had a right subacromial injection.   Current pain is 8/10.   12) Hip pain   Pain Inventory Average Pain 8 Pain Right Now 8 My pain is constant, sharp, burning, dull, stabbing, and aching  In the last 24 hours, has pain interfered with the following? General activity 4 Relation  with others 4 Enjoyment of life 4 What TIME of day is your pain at its worst? evening, daytime & night Sleep (in general) Fair  Pain is worse with: walking, bending, sitting, inactivity, and standing Pain improves with: rest, heat/ice, and medication Relief from Meds: 6     Family History  Problem Relation Age of Onset   Emphysema Mother        smoker   Allergies Mother    Asthma Mother    Heart disease Mother        AMI as cause of death   COPD Mother    Asthma Father    Heart disease Father        AMI as cause of death   Diabetes Father    Asthma Brother    Heart disease Brother 15       heart failure   Lung cancer Maternal Grandfather        was a smoker   Cancer Maternal Grandfather        lung   Heart disease Paternal Grandmother        AMI   Heart disease Paternal Grandfather        AMI   Colon cancer Neg Hx    Social History   Socioeconomic History   Marital status: Divorced    Spouse name: Not on file   Number of children: 1   Years of education: Not on file   Highest education level: Not on file  Occupational History   Occupation:  IT sales professional: [employer]  Tobacco Use   Smoking status: Never   Smokeless tobacco: Never  Vaping Use   Vaping Use: Never used  Substance and Sexual Activity   Alcohol use: Not Currently    Comment: occ   Drug use: No    Comment: former maryjuana use- quit in 1995   Sexual activity: Yes    Partners: Male    Birth control/protection: Surgical  Other Topics Concern   Not on file  Social History Narrative   Marital status: divorced x 2; dating seriously x 3 years.        Children:  1 daughter (68); no grandchildren      Lives:  With boyfriend.        Employment: works at [employer]      Tobacco: none      Alcohol: rarely; socially      Exercise: none; has treadmill and elliptical and bikes   Coffee in am / 1/2 of coke in afternoon    High school education      01/29/20   From: the area   Living: alone - but boyfriend living with her Genelle Bal (2020) but knew each other in high school   Work: on disability - working on appeal      Family: one daughter Leonette Nutting - one grandchild coming in January       Enjoys: relax and watch tv, use to like fishing/boating/rafting, reading, crochet       Exercise: rehab exercises   Diet: not great - limited intake      Safety   Seat belts: Yes    Guns: Yes  and secure   Right handed   One story home   Drinks caffeine   Social Determinants of Health   Financial Resource Strain: Not on file  Food Insecurity: Not on file  Transportation Needs: Not on file  Physical Activity: Not on file  Stress: Not on file  Social Connections: Not on file   Past Surgical History:  Procedure Laterality Date   ABDOMINAL HYSTERECTOMY     still with both ovaries   CATARACT EXTRACTION Bilateral 2015   COLONOSCOPY     CYSTOSCOPY W/ URETERAL STENT PLACEMENT Right 05/23/2017   Procedure: CYSTOSCOPY WITH RETROGRADE PYELOGRAM/URETERAL RIGHT STENT PLACEMENT;  Surgeon: Alfredo Martinez, MD;  Location: [facility];  Service:  Urology;  Laterality: Right;   CYSTOSCOPY W/ URETERAL STENT PLACEMENT Right 06/04/2017   Procedure: CYSTOSCOPY WITHRIGHT RETROGRADE PYELOGRAMRIGHT /URETEREOSCOPY  STENT PLACEMENT;  Surgeon: Crista Elliot, MD;  Location: [facility];  Service: Urology;  Laterality: Right;   ENDOMETRIAL ABLATION     ESOPHAGOGASTRODUODENOSCOPY (EGD) WITH PROPOFOL N/A 08/16/2022   Procedure: ESOPHAGOGASTRODUODENOSCOPY (EGD) WITH PROPOFOL;  Surgeon: Toney Reil, MD;  Location: [facility];  Service: Gastroenterology;  Laterality: N/A;   EYE SURGERY     HEMORRHOID SURGERY  2011   INCONTINENCE SURGERY     INNER EAR SURGERY     X 2   RECTAL PROLAPSE REPAIR     RETINAL DETACHMENT SURGERY Bilateral 2000   TOTAL KNEE ARTHROPLASTY Left 04/29/2019   Procedure: LEFT TOTAL KNEE ARTHROPLASTY;  Surgeon: Kathryne Hitch, MD;  Location: [facility];  Service: Orthopedics;  Laterality: Left;   TUBAL LIGATION     VEIN SURGERY Left 04/2010   Past Medical History:  Diagnosis Date   Allergy    Anal fissure    Anemia    borderline   Anxiety    Arthritis    Blood clot in vein    left leg   Detached retina    BOTH SCLERA BUCKLE BOTH EYES   DVT (deep venous thrombosis) (HCC)    left leg   Endometriosis    Family history of adverse reaction to anesthesia    DAUGHTER POST OP PONV   GERD (gastroesophageal reflux disease)    Glaucoma    RIGHT WORST THAN LEFT   Heart murmur    "caused by anxiety"    History of hemorrhoids    History of kidney stones    Hypertension    IBS (irritable bowel syndrome)    Perforated eardrum, right    SMALL HOLE   PONV (postoperative nausea and vomiting)    Pulmonary embolism (HCC) 6 YRS AGO   Status post arthroscopy of left knee 08/06/2017   LMP 10/11/2010   Opioid Risk Score:   Fall Risk Score:  `1  Depression screen [facility] 2/9     10/24/2022    2:28 PM 10/11/2022   10:15 AM 08/25/2022   10:50 AM 07/11/2022   12:44 PM 03/28/2022   10:38 AM 10/12/2021    11:35 AM 08/02/2021   11:31 AM  Depression screen PHQ 2/9  Decreased Interest  2 2 1 1 3 1   Down, Depressed, Hopeless  1 1 1 1 1 1   PHQ - 2 Score  3 3 2 2 4 2   Altered sleeping 1 0 3   1   Tired, decreased energy 1 3 3   3    Change in appetite  2 0   0   Feeling bad or failure about yourself   0 0   0   Trouble concentrating  0 0   0   Moving slowly or fidgety/restless  0 0   0   Suicidal thoughts  0 0   0   PHQ-9 Score  8 9   8  Difficult doing work/chores  Very difficult Extremely dIfficult   Somewhat difficult    Review of Systems  Constitutional: Negative.        Weight loss  HENT: Negative.   Eyes: Negative.   Respiratory: Positive for shortness of breath.   Cardiovascular: Negative.   Gastrointestinal: Negative.   Endocrine: Negative.   Genitourinary: Negative.   Musculoskeletal: Positive for arthralgias, back pain and gait problem.       Spasms  Skin: Negative.   Allergic/Immunologic: Negative.   Neurological: Positive for tremors and numbness.  Hematological: Negative.   Psychiatric/Behavioral:       Anxiety  All other systems reviewed and are negative.      Objective:  Gen: no distress, normal appearing, BMI 24.90, weight 159 lbs, BP 118/78 HEENT: oral mucosa pink and moist, NCAT Cardio: Reg rate Chest: normal effort, normal rate of breathing Abd: soft, non-distended Ext: no edema Psych: pleasant, normal affect Skin: intact Neuro: Alert and oriented x3 MSK: ambulates with cane  Assessment & Plan:  Sharon [name] is a 57 year old woman who presents for follow-up:   1) Left knee pain post-surgery: -discussed that knee pain has worsened since she has been weaning off her medications to avoid their potential side effects -She has a plan for repeat surgical procedure with Dr. Magnus Ivan that will hopefully provide relief.  -She has been icing regularly- recommended three tines per day for 15 minutes. Discussed that this decreases blood flow and can impair  healing but help with swelling and pain relief.  -discussed the limitations of her pain on her function -discussed extracorporeal shockwave therapy and how this can help break apart scar tissue -unable to work due the level of her pain -discussed current impact on her life -continue cane for balance -She has been through physical therapy and performs her exercises diligently. --Discussed Sprint PNS system as an option of pain treatment via neuromodulation. Provided following link for patient to learn more about the system: https://www.sprtherapeutics.com/. She does not like the feeling of things under her skin and defers that option.  -She weaned herself all the steroids.  -Provided with a pain relief journal and discussed that it contains foods and lifestyle tips to naturally help to improve pain. Discussed that these lifestyle strategies are also very good for health unlike some medications which can have negative side effects. Discussed that the act of keeping a journal can be therapeutic and helpful to realize patterns what helps to trigger and alleviate pain.   -discussed mechanism of action of low dose naltrexone as an opioid receptor antagonist which stimulates your body's production of its own natural endogenous opioids, helping to decrease pain. Discussed that it can also decrease T cell response and thus be helpful in decreasing inflammation, and symptoms of brain fog, fatigue, anxiety, depression, and allergies. Discussed that this medication needs to be compounded at a compounding pharmacy and can more expensive. Discussed that I usually start at 1mg  and if this is not providing enough relief then I titrate upward on a monthly basis.   -Discussed Qutenza as an option for neuropathic pain control. Discussed that this is a capsaicin patch, stronger than capsaicin cream. Discussed that it is currently approved for diabetic peripheral neuropathy and post-herpetic neuralgia, but that it has also  shown benefit in treating other forms of neuropathy. Provided patient with link to site to learn more about the patch: https://www.clark.biz/. Discussed that the patch would be placed in office and benefits usually last 3 months.  Discussed that unintended exposure to capsaicin can cause severe irritation of eyes, mucous membranes, respiratory tract, and skin, but that Qutenza is a local treatment and does not have the systemic side effects of other nerve medications. Discussed that there may be pain, itching, erythema, and decreased sensory function associated with the application of Qutenza. Side effects usually subside within 1 week. A cold pack of analgesic medications can help with these side effects. Blood pressure can also be increased due to pain associated with administration of the patch.  -continue ice     -Refilled Robaxin as she is nervous about feeling too sleepy with the Tizanidine, and the Robaxin does help.   Continue gabapentin to 600mg  TID PRN.   -Continue neoprene knee sleeve for proprioception  -She has tried Celebrex and Tramadol without benefit. She had respiratory distress with hydrocodone.   -She had worse pain with steroid injection in her left knee.   -Discussed steroids but she did not like the side effect profile. Discussed the different steroid options. She weaned off these because she developed mouth sores.   -Continue turmeric and cherries.   -Continue Lidocaine patches today.   -She would like rheum panel checked: ordered, reviewed results which were negative, and discussed with patient.  -Refilled meloxicam.   2) Bunion:  -continue orthotics  3) Irritable bowel syndrome: -She does not eat much sugar or sweets.  -She drinks 1 coke per day.  -She cuts tea out.  -She drinks water, coffee.  -She has been going more regularly and has not needed her Miralax.  -food allergy testing ordered  4) Impaired Concentration: - This persists -She forgets things  easily.  -She tries not to multitask when she was with her grandmother.   5) Anxiety: The Gabapentin is helping with this as well, continue -discussed plan to start ecitalopram and that she has cleared this with her ophthalmologist  6) Vulvodynia: -discussed various treatment options: biofeedback, medications, continue estradiol.  -continue Amitriptyline 10mg  HS, discussed moving dose earlier in the evening.   7) Cervical and lumbar myofascial pain: -try massage -discussed benefits of therapy and trigger point injections -apply lidocaine patch.   8) Bilateral hand pain: likely secondary to arthritis compounded by carpal tunnel syndrome -continue Gabapentin which is helping -continue f/u with surgery as planned.   9) Low back pain, appears to be secondary to face arthritis -discussed XR, but she defers due to potential cost -discussed surgery with an orthopedic and she would like to consider this but her insurance would not cover it.  -discussed lack of improvement with injections from Dr. Modesto Charon.  -continue lidocaine patches, discussed that these make her feel weird when she takes them off.  - Ketamine 10%, Baclofen 2%, Cyclobenzaprine 2%, Ketoprofen 10%, Gabapentin 6%, Bupivacaine 1%, Amitryptiline 5%, clonidine 0.2%  Dispense 240 g  Apply 1-2 g to affected area 3-4 times per day  -continue postural correction -Provided with a pain relief journal and discussed that it contains foods and lifestyle tips to naturally help to improve pain. Discussed that these lifestyle strategies are also very good for health unlike some medications which can have negative side effects. Discussed that the act of keeping a journal can be therapeutic and helpful to realize patterns what helps to trigger and alleviate pain.   -Discussed following foods that may reduce pain: 1) Ginger (especially studied for arthritis)- reduce leukotriene production to decrease inflammation 2) Blueberries- high in  phytonutrients that decrease inflammation 3) Salmon- marine omega-3s reduce joint swelling and  pain 4) Pumpkin seeds- reduce inflammation 5) dark chocolate- reduces inflammation 6) turmeric- reduces inflammation 7) tart cherries - reduce pain and stiffness 8) extra virgin olive oil - its compound olecanthal helps to block prostaglandins  9) chili peppers- can be eaten or applied topically via capsaicin 10) mint- helpful for headache, muscle aches, joint pain, and itching 11) garlic- reduces inflammation  Link to further information on diet for chronic pain: http://www.bray.com/   10) shoulder pain -used to get shots in the pt, but laying on her back has helped to decrease her shoulder pain  11) Acid reflux -continues famotidine.   11) Soreness in legs -vascular ultrasound ordered of both legs -discussed her history of clots -discussed that a D-dimer test can suggest clot if elevated.   12) Irritable bowel syndrome -discussed her current diet -discussed that she does not eat breakfast due to nausea -discussed her diarrhea and constipation, more diarrheal type at this time.  -discussed her aunt's advise to have a tsp coconut oil daily.  -recommended bioptemizer's magnesium breakthrough -reviewed food allergies with her and made goal for 4 week elimination of foods with the highest responses  13) Insomnia/Shortness of breath with history of clots -discussed that there is no evidence of PE on her CTA -discussed her follow-up with PCP -discussed that her vascular ultrasound of her lower extremities shows a chronic left peroneal CVT  -recommended starting nattokinase to help thin the blood and lower her risk for clots, or to eat natto -discussed the importance of movement in clot prevention -referred for sleep study for shortness of breath  14) Impaired memory: -discussed her memory difficulties. -discussed  that some studies have linked chronic PPI use to memory impairment -discussed safe way to wean off these medications slowly  15) Hemorroids: Recommended Epsom salt baths  16) prediabetes - recommended avoiding processed foods/added sugars/bread/pasta/rice -recommended drinking a glass of water with either apple cide vinegar or lemon -recommended reading Wheat Belly and Grain Brain -recommended eating fruits, vegetables, yogurt, nuts, olive oil -discussed risks and benefits of metformin -discussed that repeat labs show normalization of HgbA1c! -discussed that she does not eat much anymore -encouraged getting enough protein  17) Transaminitis -recommended avoiding ultraprocessed foods -recommended avoiding/minimizing tylenol -recommended supplement NAC 600mg  BID  -recommended drinking dandelion and milk thistle teas -commended on stopping drinking soda products! -recommended avoiding processed creamer in coffee, continue drinking coffee -discussed that repeat CMP shows normalization of liver enzymes!  18) Right knee pain: -encouraged use of exercise bike to strengthen quadriceps tendon  19) Dry mouth: -stop amitriptyline  20) Benzodiazepine withdrawal: -discussed that this should be weaned slowly  31) Restless leg syndrome:  -discussed requip, will prescribe  32) Insomnia: -discussed that she has been on Klonopin for 12 years and is trying to wean off as her PCP is concerned it may be contributing to her brain fog -discussed that Klonopin can contribute to memory decline and commended her for trying to wean off -discussed that she has been having dry mouth and she is not sure if it is from the Requip. She has stopped taking this in case and dry mouth has continued -discussed that robaxin can also cause dry mouth -discussed taking muscle relaxer earlier in the evening so she does not have to wake at night to drink water  33) Chest pain: -discussed current  symptoms -prescribed nitroglycerin  26 minutes spent in discussion of her chest pain, discussed nitroglycerin and sent script, discussed plan to start ecistalopram, discussed that knee  pain has worsened since she has been weaning off medications

## 2022-11-22 ENCOUNTER — Telehealth: Payer: Self-pay | Admitting: *Deleted

## 2022-11-22 NOTE — Telephone Encounter (Signed)
Pharmacy requested alternative for methocarbamol which is not covered by insurance. Insurance will cover cyclobenzaprine 5 mg and Tizanidine 2 mg.

## 2022-11-23 ENCOUNTER — Encounter: Payer: Medicare HMO | Attending: Physical Medicine and Rehabilitation | Admitting: Physical Medicine and Rehabilitation

## 2022-11-23 DIAGNOSIS — G4701 Insomnia due to medical condition: Secondary | ICD-10-CM | POA: Diagnosis not present

## 2022-11-23 MED ORDER — TRAZODONE HCL 50 MG PO TABS: 50.0000 mg | ORAL_TABLET | Freq: Every evening | ORAL | 3 refills | Status: DC | PRN

## 2022-11-23 NOTE — Progress Notes (Signed)
Subjective:    Patient ID: Sharon [name], female    DOB: 11-Apr-1966, 57 y.o.   MRN: [id number]  HPI  An audio/video tele-health visit is felt to be the most appropriate encounter for this patient at this time. This is a follow up tele-visit via phone. The patient is at home. MD is at office. Prior to scheduling this appointment, our staff discussed the limitations of evaluation and management by telemedicine and the availability of in-person appointments. The patient expressed understanding and agreed to proceed.   Sharon [name] is a 57 year old woman who presents for f/u insomnia, bilateral knee pain following left knee replacement in 04/2019, transaminitis, shortness of breath, brain fog, and prediabetes.   1) Bilateral knee pain L>R: -right knee pain is getting worse -getting a new bike and she is hoping that strengthening her muscles will help -went to church a couple times but it hurt so badly. -right knee pain has been getting worse -Her ROM is still restricted in her left knee.  -ice helps -radiates between hip and left knee Feels like a charley horse -She asks whether the scar tissue will ever heal.  -Cannot sleep without a pillow underneath her legs.  -She has follow-up with Dr. Turner Daniels and Dr. Magnus Ivan -Currently is not planning to pursue surgery.  -She has been off hydrocodone.  -She would be interested in increasing the Gabapentin dose.  -She would like to discuss an option for inflammation. She has tried aspirations but the fluid comes back right away.  -She got a  Steroid injection and experienced worsening range of motion in her left knee- that was from Dr. Turner Daniels. He also pulled fluid off. It does help with pain, but she does not want another given her worsening range of motion afterward. She does have improved sensation in her knee after the injection! -has been hurting -discussed Qutenza as a treatment option -her current regimen is working for her.  -has been  having digestive issues and stopped meloxicam.  -pain has been severe lately -she has decreased her robaxin to daily but this has not improved her dry mouth  2) Anxiety: This has much improved with the Gabapentin. 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  -she has used blue emu oil and diclofenac gel without great benefit -has appointment with physician today.  -she does use her hands a lot.  -she has been dropping things recently.   9) lower back pain -has gotten worse -got Xrs and was shown to have cyst on right side and bulging disc on left side and pinched nerve in the middle.  -she feels a constant burning -worst on left side -feels she is walking crooked -she had an injection by Dr. Modesto Charon at orthopedics but these did not help.  -she know has a bone further up that is bothering her.  -left pain in her boy friend's car so had a hard time getting in today -hurts severely sometimes -pinches when she walks -sitting also hurts -she does not have insurance right now -ice helps -she uses heat for the muscular pain -hurt it while  bending and turning -has been worse since she carried her grandchild -she is unable to vacuum.  -discussed therapy and using lidocaine patch, massage.  -she is ok using gabapentin and her muscle relaxers.  -found lidocaine patch to be a lifesaver while she was at the beach.  -ready to try Qutenza today -has been severe recently -plans to follow-up with her orthopedic surgeon.   10) Constipation -has been needing to take laxatives.   11) shortness of breath -worried abut blot  clots -she asks about results of her vascular ultrasound of her lower extremity and what treatment is necessary, how she would know if there are clots elsewhere in her body, and how she may have developed these clots.  -she has being set up for tests  12) impaired memory  13) Prediabetes -she asks about HgbA1c test result  14) Transaminitis -she asks whether any of her medications could have caused this'  15) Food allergy -she asks about her food allergy results  16) Insomnia: -asks if there is something else she can try for her insomnia -she has been trying to wean off her Klonopin but this makes it harder for her to fall asleep -the Requip caused dry mouth and she is hesitant about its potential side effects -she has not tried tart cherry juice or melatonin  17) Chest pain -has been radiating to left arm -she follows with a cardiologist Prior history:  Since last visit she has stopped Norco as it caused respiratory distress. We tried Cymbalta 40mg  without benefit or side effects. She has tried voltaren gel, blue emu oil without benefit. She has tried CBD.   She has been trying to improve quadriceps musculature. She discusses some of the exercises she does.   She has been denied in her disability. She has talked to an attorney.  She continues to ambulate with a cane.   She has tried the compounding cream and that does not help much. She has been trying Hemp cream which is helping.  She is getting foot insoles from the podiatrist. They should be ready in a week or two.   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   Can ride the stationary bicycle and that does not hurt. She sues it every day. She uses leg weights. When   Scar tissue grew back quickly post-surgery. She could not get PT appointments for 3 weeks.   Her surgeon does not want her to break the hardware and he  may have to do arthroscopic procedure to flush out the inflammation.   She has tried ice which stopped helping, nabumetone 750 mg BID, Norco 1-2 tablets three times per day PRN (she has 12 left). She uses diclofenac gel   She takes Miralax for constipation. She takes up to 2 times per day. She has a history of IBS.   [facility] in [place]- she was a Scientist, physiological. They let her go because she did not return in 12 weeks.   She was doing pool therapy but chemicals in the pool were increased because of COVID.  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 Notes reviewed- recently had a right subacromial injection.   Current pain is 8/10.   12) Hip pain   Pain Inventory Average Pain 8 Pain Right Now 8 My pain is constant, sharp, burning, dull, stabbing, and aching  In the last  24 hours, has pain interfered with the following? General activity 4 Relation with others 4 Enjoyment of life 4 What TIME of day is your pain at its worst? evening, daytime & night Sleep (in general) Fair  Pain is worse with: walking, bending, sitting, inactivity, and standing Pain improves with: rest, heat/ice, and medication Relief from Meds: 6     Family History  Problem Relation Age of Onset   Emphysema Mother        smoker   Allergies Mother    Asthma Mother    Heart disease Mother        AMI as cause of death   COPD Mother    Asthma Father    Heart disease Father        AMI as cause of death   Diabetes Father    Asthma Brother    Heart disease Brother 56       heart failure   Lung cancer Maternal Grandfather        was a smoker   Cancer Maternal Grandfather        lung   Heart disease Paternal Grandmother        AMI   Heart disease Paternal Grandfather        AMI   Colon cancer Neg Hx    Social History   Socioeconomic History   Marital status: Divorced    Spouse name: Not on file   Number of children: 1   Years of education: Not on file   Highest education level:  Not on file  Occupational History   Occupation: IT sales professional: [employer]  Tobacco Use   Smoking status: Never   Smokeless tobacco: Never  Vaping Use   Vaping Use: Never used  Substance and Sexual Activity   Alcohol use: Not Currently    Comment: occ   Drug use: No    Comment: former maryjuana use- quit in 1995   Sexual activity: Yes    Partners: Male    Birth control/protection: Surgical  Other Topics Concern   Not on file  Social History Narrative   Marital status: divorced x 2; dating seriously x 3 years.        Children:  1 daughter (46); no grandchildren      Lives:  With boyfriend.        Employment: works at [employer]      Tobacco: none      Alcohol: rarely; socially      Exercise: none; has treadmill and elliptical and bikes   Coffee in am / 1/2 of coke in afternoon    High school education      01/29/20   From: the area   Living: alone - but boyfriend living with her Genelle Bal (2020) but knew each other in high school   Work: on disability - working on appeal      Family: one daughter Leonette Nutting - one grandchild coming in January       Enjoys: relax and watch tv, use to like fishing/boating/rafting, reading, crochet       Exercise: rehab exercises   Diet: not great - limited intake      Safety   Seat belts: Yes    Guns: Yes  and secure   Right handed   One story home   Drinks caffeine   Social Determinants of Health   Financial Resource Strain: Not on file  Food Insecurity: Not on file  Transportation Needs: Not on file  Physical Activity: Not on file  Stress: Not on file  Social Connections: Not on file   Past Surgical History:  Procedure Laterality Date   ABDOMINAL HYSTERECTOMY     still with both ovaries   CATARACT EXTRACTION Bilateral 2015   COLONOSCOPY     CYSTOSCOPY W/ URETERAL STENT PLACEMENT Right 05/23/2017   Procedure: CYSTOSCOPY WITH RETROGRADE PYELOGRAM/URETERAL RIGHT STENT PLACEMENT;  Surgeon:  Alfredo Martinez, MD;  Location: [facility];  Service: Urology;  Laterality: Right;   CYSTOSCOPY W/ URETERAL STENT PLACEMENT Right 06/04/2017   Procedure: CYSTOSCOPY WITHRIGHT RETROGRADE PYELOGRAMRIGHT /URETEREOSCOPY  STENT PLACEMENT;  Surgeon: Crista Elliot, MD;  [facility];  Service: Urology;  Laterality: Right;   ENDOMETRIAL ABLATION     ESOPHAGOGASTRODUODENOSCOPY (EGD) WITH PROPOFOL N/A 08/16/2022   Procedure: ESOPHAGOGASTRODUODENOSCOPY (EGD) WITH PROPOFOL;  Surgeon: Toney Reil, MD;  Location: [facility];  Service: Gastroenterology;  Laterality: N/A;   EYE SURGERY     HEMORRHOID SURGERY  2011   INCONTINENCE SURGERY     INNER EAR SURGERY     X 2   RECTAL PROLAPSE REPAIR     RETINAL DETACHMENT SURGERY Bilateral 2000   TOTAL KNEE ARTHROPLASTY Left 04/29/2019   Procedure: LEFT TOTAL KNEE ARTHROPLASTY;  Surgeon: Kathryne Hitch, MD;  Location: [facility];  Service: Orthopedics;  Laterality: Left;   TUBAL LIGATION     VEIN SURGERY Left 04/2010   Past Medical History:  Diagnosis Date   Allergy    Anal fissure    Anemia    borderline   Anxiety    Arthritis    Blood clot in vein    left leg   Detached retina    BOTH SCLERA BUCKLE BOTH EYES   DVT (deep venous thrombosis) (HCC)    left leg   Endometriosis    Family history of adverse reaction to anesthesia    DAUGHTER POST OP PONV   GERD (gastroesophageal reflux disease)    Glaucoma    RIGHT WORST THAN LEFT   Heart murmur    "caused by anxiety"    History of hemorrhoids    History of kidney stones    Hypertension    IBS (irritable bowel syndrome)    Perforated eardrum, right    SMALL HOLE   PONV (postoperative nausea and vomiting)    Pulmonary embolism (HCC) 6 YRS AGO   Status post arthroscopy of left knee 08/06/2017   LMP 10/11/2010   Opioid Risk Score:   Fall Risk Score:  `1  Depression screen PHQ 2/9     10/24/2022    2:28 PM 10/11/2022   10:15 AM 08/25/2022   10:50 AM  07/11/2022   12:44 PM 03/28/2022   10:38 AM 10/12/2021   11:35 AM 08/02/2021   11:31 AM  Depression screen PHQ 2/9  Decreased Interest  2 2 1 1 3 1   Down, Depressed, Hopeless  1 1 1 1 1 1   PHQ - 2 Score  3 3 2 2 4 2   Altered sleeping 1 0 3   1   Tired, decreased energy 1 3 3   3    Change in appetite  2 0   0   Feeling bad or failure about yourself   0 0   0   Trouble concentrating  0 0   0   Moving slowly or fidgety/restless  0 0   0   Suicidal thoughts  0 0  0   PHQ-9 Score  8 9   8    Difficult doing work/chores  Very difficult Extremely dIfficult   Somewhat difficult    Review of Systems  Constitutional: Negative.        Weight loss  HENT: Negative.   Eyes: Negative.   Respiratory: Positive for shortness of breath.   Cardiovascular: Negative.   Gastrointestinal: Negative.   Endocrine: Negative.   Genitourinary: Negative.   Musculoskeletal: Positive for arthralgias, back pain and gait problem.       Spasms  Skin: Negative.   Allergic/Immunologic: Negative.   Neurological: Positive for tremors and numbness.  Hematological: Negative.   Psychiatric/Behavioral:       Anxiety  All other systems reviewed and are negative.      Objective:  Gen: no distress, normal appearing, BMI 24.90, weight 159 lbs, BP 118/78 HEENT: oral mucosa pink and moist, NCAT Cardio: Reg rate Chest: normal effort, normal rate of breathing Abd: soft, non-distended Ext: no edema Psych: pleasant, normal affect Skin: intact Neuro: Alert and oriented x3 MSK: ambulates with cane  Assessment & Plan:  Sharon [name] is a 57 year old woman who presents for follow-up:   1) Left knee pain post-surgery: -discussed that knee pain has worsened since she has been weaning off her medications to avoid their potential side effects -She has a plan for repeat surgical procedure with Dr. Magnus Ivan that will hopefully provide relief.  -She has been icing regularly- recommended three tines per day for 15 minutes.  Discussed that this decreases blood flow and can impair healing but help with swelling and pain relief.  -discussed the limitations of her pain on her function -discussed extracorporeal shockwave therapy and how this can help break apart scar tissue -unable to work due the level of her pain -discussed current impact on her life -continue cane for balance -She has been through physical therapy and performs her exercises diligently. --Discussed Sprint PNS system as an option of pain treatment via neuromodulation. Provided following link for patient to learn more about the system: https://www.sprtherapeutics.com/. She does not like the feeling of things under her skin and defers that option.  -She weaned herself all the steroids.  -Provided with a pain relief journal and discussed that it contains foods and lifestyle tips to naturally help to improve pain. Discussed that these lifestyle strategies are also very good for health unlike some medications which can have negative side effects. Discussed that the act of keeping a journal can be therapeutic and helpful to realize patterns what helps to trigger and alleviate pain.   -discussed mechanism of action of low dose naltrexone as an opioid receptor antagonist which stimulates your body's production of its own natural endogenous opioids, helping to decrease pain. Discussed that it can also decrease T cell response and thus be helpful in decreasing inflammation, and symptoms of brain fog, fatigue, anxiety, depression, and allergies. Discussed that this medication needs to be compounded at a compounding pharmacy and can more expensive. Discussed that I usually start at 1mg  and if this is not providing enough relief then I titrate upward on a monthly basis.   -Discussed Qutenza as an option for neuropathic pain control. Discussed that this is a capsaicin patch, stronger than capsaicin cream. Discussed that it is currently approved for diabetic peripheral  neuropathy and post-herpetic neuralgia, but that it has also shown benefit in treating other forms of neuropathy. Provided patient with link to site to learn more about the patch: https://www.clark.biz/. Discussed that  the patch would be placed in office and benefits usually last 3 months. Discussed that unintended exposure to capsaicin can cause severe irritation of eyes, mucous membranes, respiratory tract, and skin, but that Qutenza is a local treatment and does not have the systemic side effects of other nerve medications. Discussed that there may be pain, itching, erythema, and decreased sensory function associated with the application of Qutenza. Side effects usually subside within 1 week. A cold pack of analgesic medications can help with these side effects. Blood pressure can also be increased due to pain associated with administration of the patch.  -continue ice     -Refilled Robaxin as she is nervous about feeling too sleepy with the Tizanidine, and the Robaxin does help.   Continue gabapentin to 600mg  TID PRN.   -Continue neoprene knee sleeve for proprioception  -She has tried Celebrex and Tramadol without benefit. She had respiratory distress with hydrocodone.   -She had worse pain with steroid injection in her left knee.   -Discussed steroids but she did not like the side effect profile. Discussed the different steroid options. She weaned off these because she developed mouth sores.   -Continue turmeric and cherries.   -Continue Lidocaine patches today.   -She would like rheum panel checked: ordered, reviewed results which were negative, and discussed with patient.  -Refilled meloxicam.   2) Bunion:  -continue orthotics  3) Irritable bowel syndrome: -She does not eat much sugar or sweets.  -She drinks 1 coke per day.  -She cuts tea out.  -She drinks water, coffee.  -She has been going more regularly and has not needed her Miralax.  -food allergy testing ordered  4)  Impaired Concentration: - This persists -She forgets things easily.  -She tries not to multitask when she was with her grandmother.   5) Anxiety: The Gabapentin is helping with this as well, continue -discussed plan to start ecitalopram and that she has cleared this with her ophthalmologist  6) Vulvodynia: -discussed various treatment options: biofeedback, medications, continue estradiol.  -continue Amitriptyline 10mg  HS, discussed moving dose earlier in the evening.   7) Cervical and lumbar myofascial pain: -try massage -discussed benefits of therapy and trigger point injections -apply lidocaine patch.   8) Bilateral hand pain: likely secondary to arthritis compounded by carpal tunnel syndrome -continue Gabapentin which is helping -continue f/u with surgery as planned.   9) Low back pain, appears to be secondary to face arthritis -discussed XR, but she defers due to potential cost -discussed surgery with an orthopedic and she would like to consider this but her insurance would not cover it.  -discussed lack of improvement with injections from Dr. Modesto Charon.  -continue lidocaine patches, discussed that these make her feel weird when she takes them off.  - Ketamine 10%, Baclofen 2%, Cyclobenzaprine 2%, Ketoprofen 10%, Gabapentin 6%, Bupivacaine 1%, Amitryptiline 5%, clonidine 0.2%  Dispense 240 g  Apply 1-2 g to affected area 3-4 times per day  -continue postural correction -Provided with a pain relief journal and discussed that it contains foods and lifestyle tips to naturally help to improve pain. Discussed that these lifestyle strategies are also very good for health unlike some medications which can have negative side effects. Discussed that the act of keeping a journal can be therapeutic and helpful to realize patterns what helps to trigger and alleviate pain.   -Discussed following foods that may reduce pain: 1) Ginger (especially studied for arthritis)- reduce leukotriene  production to decrease inflammation 2) Blueberries- high  in phytonutrients that decrease inflammation 3) Salmon- marine omega-3s reduce joint swelling and pain 4) Pumpkin seeds- reduce inflammation 5) dark chocolate- reduces inflammation 6) turmeric- reduces inflammation 7) tart cherries - reduce pain and stiffness 8) extra virgin olive oil - its compound olecanthal helps to block prostaglandins  9) chili peppers- can be eaten or applied topically via capsaicin 10) mint- helpful for headache, muscle aches, joint pain, and itching 11) garlic- reduces inflammation  Link to further information on diet for chronic pain: http://www.bray.com/   10) shoulder pain -used to get shots in the pt, but laying on her back has helped to decrease her shoulder pain  11) Acid reflux -continues famotidine.   11) Soreness in legs -vascular ultrasound ordered of both legs -discussed her history of clots -discussed that a D-dimer test can suggest clot if elevated.   12) Irritable bowel syndrome -discussed her current diet -discussed that she does not eat breakfast due to nausea -discussed her diarrhea and constipation, more diarrheal type at this time.  -discussed her aunt's advise to have a tsp coconut oil daily.  -recommended bioptemizer's magnesium breakthrough -reviewed food allergies with her and made goal for 4 week elimination of foods with the highest responses  13) Insomnia/Shortness of breath with history of clots -discussed that there is no evidence of PE on her CTA -discussed her follow-up with PCP -discussed that her vascular ultrasound of her lower extremities shows a chronic left peroneal CVT  -recommended starting nattokinase to help thin the blood and lower her risk for clots, or to eat natto -discussed the importance of movement in clot prevention -referred for sleep study for shortness of breath  14) Impaired  memory: -discussed her memory difficulties. -discussed that some studies have linked chronic PPI use to memory impairment -discussed safe way to wean off these medications slowly  15) Hemorroids: Recommended Epsom salt baths  16) prediabetes - recommended avoiding processed foods/added sugars/bread/pasta/rice -recommended drinking a glass of water with either apple cide vinegar or lemon -recommended reading Wheat Belly and Grain Brain -recommended eating fruits, vegetables, yogurt, nuts, olive oil -discussed risks and benefits of metformin -discussed that repeat labs show normalization of HgbA1c! -discussed that she does not eat much anymore -encouraged getting enough protein  17) Transaminitis -recommended avoiding ultraprocessed foods -recommended avoiding/minimizing tylenol -recommended supplement NAC 600mg  BID  -recommended drinking dandelion and milk thistle teas -commended on stopping drinking soda products! -recommended avoiding processed creamer in coffee, continue drinking coffee -discussed that repeat CMP shows normalization of liver enzymes!  18) Right knee pain: -encouraged use of exercise bike to strengthen quadriceps tendon  19) Dry mouth: -stop amitriptyline  20) Benzodiazepine withdrawal: -discussed that this should be weaned slowly  31) Restless leg syndrome:  -discussed requip, will prescribe  32) Insomnia: -prescribed trazodone, discussed that this promotes increased deep sleep -recommended tart cherry juice -discussed that she has been on Klonopin for 12 years and is trying to wean off as her PCP is concerned it may be contributing to her brain fog -discussed that Klonopin can contribute to memory decline and commended her for trying to wean off -discussed that she has been having dry mouth and she is not sure if it is from the Requip. She has stopped taking this in case and dry mouth has continued -discussed that robaxin can also cause dry  mouth -discussed taking muscle relaxer earlier in the evening so she does not have to wake at night to drink water  33) Chest pain: -discussed current  symptoms -prescribed nitroglycerin  7 minutes spent in discussion of her insomnia, recommended tart cherry juice and prescribed trazodone

## 2022-11-28 ENCOUNTER — Ambulatory Visit
Admission: RE | Admit: 2022-11-28 | Discharge: 2022-11-28 | Disposition: A | Discharge status: Home / Self Care | Payer: Medicare HMO | Source: Ambulatory Visit | Attending: Obstetrics and Gynecology | Admitting: Obstetrics and Gynecology

## 2022-11-28 DIAGNOSIS — Z6825 Body mass index (BMI) 25.0-25.9, adult: Secondary | ICD-10-CM | POA: Diagnosis not present

## 2022-11-28 DIAGNOSIS — Z01419 Encounter for gynecological examination (general) (routine) without abnormal findings: Secondary | ICD-10-CM | POA: Diagnosis not present

## 2022-11-28 DIAGNOSIS — Z86718 Personal history of other venous thrombosis and embolism: Secondary | ICD-10-CM | POA: Diagnosis not present

## 2022-11-28 DIAGNOSIS — Z1231 Encounter for screening mammogram for malignant neoplasm of breast: Secondary | ICD-10-CM | POA: Diagnosis not present

## 2022-11-28 DIAGNOSIS — N952 Postmenopausal atrophic vaginitis: Secondary | ICD-10-CM | POA: Diagnosis not present

## 2022-11-30 ENCOUNTER — Other Ambulatory Visit: Payer: Self-pay | Admitting: Obstetrics and Gynecology

## 2022-11-30 DIAGNOSIS — R928 Other abnormal and inconclusive findings on diagnostic imaging of breast: Secondary | ICD-10-CM

## 2022-11-30 NOTE — Progress Notes (Signed)
NEUROLOGY FOLLOW UP OFFICE NOTE  [name] [id number]  Assessment/Plan:   1  Essential tremor 2  Subjective memory complaints - likely multifactorial, particularly related to psychologic stressors.  Neurologic exam unremarkable.  I do not suspect underlying primary neurologic etiology   1.Increase primidone to 50mg  in AM and 150mg  at bedtime (she would like to avoid increasing morning dose due to causing drowsiness) 2.Follow up 6 months.   Subjective:  Sharon [name] is a 57 year old female with generalized anxiety disorder, restless leg syndrome, hypertension, glaucoma, arthritis, chronic pain and history of PE and DVT in left leg who follows up for essential tremor   UPDATE: Medications:  Primidone 50mg  in AM and 100mg  at bedtime (150mg  made her feel "funny"); amitriptyline 10mg  QHS (not taking it), gabapentin 600mg  BID and 600-800mg  QHS, Mobic, clonazepam 0.5mg  QHS, Bystolic, Robaxin, estradiol   Tremor is worse.  If she is holding her phone, her hand will jerk.  She is dropping things. She is dealing with increased stressors.  Also still with chronic knee pain.  Reports short term memory problems.  For example, she always has to always use GPS everywhere.     HISTORY: She started having tremors around 2015.  It involves both hands.  It interferes with daily tasks.  She drops a lot.  She has occasional trouble with utensils or writing.  Nothing particular makes it worse.  Steadying her wrist on the arm rest will suppress it.     Her mother and grandmother had tremor.   She has chronic generalized pain as well as neck pain.  She reports a creepy crawly feeling under her skin, including back and down the legs.  She has been diagnosed with restless leg syndrome.  MRI of cervical spine from 04/12/18 was personally reviewed and showed cervical spondylosis, degenerative disc disease and congenitally short pedicles, causing mild impingement at C3-4 on left, C6-7 on right and C7-T1  on left.  CT of right shoulder on 04/11/18 showed severe supraspinatus and infraspinatus tendinopathy without tear as well as small type 2 SLAP tear.  She notes a 'pinching" pain radiating down the lateral arm and into the fingers with associated numbness and tingling in the fingers.  MRI lumbar spine from 04/13/18 showed L4-L5 right foraminal disc protrusion that may be causing right L4 nerve root irritation.  L5-S1 moderate face arthrosis and small central protrusion with annular fissure.  She underwent lab work on 03/04/2019 to evaluate chronic pain.  ANA was negative, RF negative, sed rate 5, CRP <0.1, CK 49.  B12 from 03/28/2022 was 553 and TSH was 1.80.  Hgb A1c from 07/11/2022 was 5.5.     Past medications:  Cymbalta (worsened tremor)    PAST MEDICAL HISTORY: Past Medical History:  Diagnosis Date   Allergy    Anal fissure    Anemia    borderline   Anxiety    Arthritis    Blood clot in vein    left leg   Detached retina    BOTH SCLERA BUCKLE BOTH EYES   DVT (deep venous thrombosis) (HCC)    left leg   Endometriosis    Family history of adverse reaction to anesthesia    DAUGHTER POST OP PONV   GERD (gastroesophageal reflux disease)    Glaucoma    RIGHT WORST THAN LEFT   Heart murmur    "caused by anxiety"    History of hemorrhoids    History of kidney stones  Hypertension    IBS (irritable bowel syndrome)    Perforated eardrum, right    SMALL HOLE   PONV (postoperative nausea and vomiting)    Pulmonary embolism (HCC) 6 YRS AGO   Status post arthroscopy of left knee 08/06/2017    MEDICATIONS: Current Outpatient Medications on File Prior to Visit  Medication Sig Dispense Refill   Acetylcysteine (NAC) 600 MG CAPS Take 600 mg by mouth 2 (two) times daily.     acyclovir (ZOVIRAX) 400 MG tablet Take 1 tablet by mouth daily.     albuterol (VENTOLIN HFA) 108 (90 Base) MCG/ACT inhaler INHALE 1-2 PUFFS BY MOUTH EVERY 6 HOURS AS NEEDED FOR WHEEZE OR SHORTNESS OF BREATH 8.5 each 2    bimatoprost (LUMIGAN) 0.03 % ophthalmic solution Place 1 drop into both eyes at bedtime.     Cholecalciferol (VITAMIN D) 50 MCG (2000 UT) tablet 1 tablet     clobetasol (TEMOVATE) 0.05 % external solution APPLY TO AFFECTED AREA TWICE A DAY 50 mL 0   clonazePAM (KLONOPIN) 0.5 MG tablet TAKE 1 TABLET BY MOUTH AT BEDTIME. 30 tablet 2   EPINEPHrine 0.3 mg/0.3 mL IJ SOAJ injection Inject 0.3 mg into the muscle as needed for anaphylaxis. 1 each 0   estradiol (ESTRACE) 0.1 MG/GM vaginal cream Insert pea-sized amount nightly for 2 weeks then every other night     famotidine (PEPCID) 40 MG tablet Take 1 tablet (40 mg total) by mouth daily. 90 tablet 1   gabapentin (NEURONTIN) 100 MG capsule Take 2 capsules (200 mg total) by mouth at bedtime. 180 capsule 1   gabapentin (NEURONTIN) 600 MG tablet Take 1 tablet (600 mg total) by mouth 3 (three) times daily. 90 tablet 3   lidocaine (LIDODERM) 5 % Apply 1 patch topically daily.     LUMIGAN 0.01 % SOLN 1 drop at bedtime.     meloxicam (MOBIC) 15 MG tablet Take 1 tablet (15 mg total) by mouth daily. 30 tablet 2   methocarbamol (ROBAXIN) 500 MG tablet Take 1 tablet (500 mg total) by mouth every 8 (eight) hours as needed for muscle spasms. 90 tablet 1   mometasone (NASONEX) 50 MCG/ACT nasal spray Place 2 sprays into the nose daily. 1 each 12   nebivolol (BYSTOLIC) 5 MG tablet TAKE 1 TABLET BY MOUTH EVERY DAY 90 tablet 3   nitroGLYCERIN (NITROSTAT) 0.4 MG SL tablet Place 1 tablet (0.4 mg total) under the tongue every 5 (five) minutes as needed for chest pain. 30 tablet 12   Nitroglycerin (RECTIV) 0.4 % OINT Place 0.4 inches rectally 2 (two) times daily as needed (For anal fissures.). 30 g 1   ondansetron (ZOFRAN-ODT) 4 MG disintegrating tablet 1 tablet on the tongue and allow to dissolve Orally Once a day for 30 day(s) 20 tablet 0   pantoprazole (PROTONIX) 20 MG tablet Take 1 tablet (20 mg total) by mouth daily. 90 tablet 3   polyethylene glycol powder  (GLYCOLAX/MIRALAX) 17 GM/SCOOP powder See admin instructions.     primidone (MYSOLINE) 50 MG tablet 1 tablet Orally Once a day for 30 day(s)     rOPINIRole (REQUIP) 0.25 MG tablet Take 1 tablet (0.25 mg total) by mouth at bedtime. 90 tablet 3   sulfamethoxazole-trimethoprim (BACTRIM DS) 800-160 MG tablet Take 1 tablet by mouth 2 (two) times daily. For urinary tract infection. 10 tablet 0   traZODone (DESYREL) 50 MG tablet Take 1 tablet (50 mg total) by mouth at bedtime as needed for sleep. 90 tablet 3  vitamin B-12 (CYANOCOBALAMIN) 1000 MCG tablet Take 1 tablet (1,000 mcg total) by mouth daily. 30 tablet 3   Zinc 50 MG TABS 1 tablet Orally Once a day for 30 day(s)     [DISCONTINUED] primidone (MYSOLINE) 50 MG tablet TAKE 1 TABLET IN MORNING AND 3 TABLETS AT BEDTIME 120 tablet 0   No current facility-administered medications on file prior to visit.    ALLERGIES: Allergies  Allergen Reactions   Bactrim [Sulfamethoxazole-Trimethoprim] Shortness Of Breath   Bee Venom Anaphylaxis and Other (See Comments)   Penicillins Other (See Comments) and Swelling    Reaction unknown from childhood Has patient had a PCN reaction causing immediate rash, facial/tongue/throat swelling, SOB or lightheadedness with hypotension: unknown Has patient had a PCN reaction causing severe rash involving mucus membranes or skin necrosis: unknown  Has patient had a PCN reaction that required hospitalization: no Has patient had a PCN reaction occurring within the last 10 years: no If all of the above answers are "NO", then may proceed with Cephalosporin use.  Other reaction(s): Other, Unknown Throat swells Throat swells Reaction unknown from childhood Has patient had a PCN reaction causing immediate rash, facial/tongue/throat swelling, SOB or lightheadedness with hypotension: unknown Has patient had a PCN reaction causing severe rash involving mucus membranes or skin necrosis: unknown  Has patient had a PCN reaction  that required hospitalization: no Has patient had a PCN reaction occurring within the last 10 years: no If all of the above answers are "NO", then may proceed with Cephalosporin use.  Other reaction(s): Not available   Dextromethorphan     Other reaction(s): Lump in throat Other reaction(s): Lump in throat   Elavil [Amitriptyline] Other (See Comments)    Dry mouth   Hydrocodone-Acetaminophen Other (See Comments)   Oxycodone Other (See Comments)   Penicillin G     Other reaction(s): Unknown   Dextromethorphan Polistirex Er Other (See Comments)    Pt states caused "a lump" in her throat, making it difficult to swallow   Fluconazole     Primidone interaction Other reaction(s): Primidone interaction Other reaction(s): Primidone interaction   Hydrocodone Nausea And Vomiting and Nausea Only    Other reaction(s): nausea and vomiting Other reaction(s): nausea and vomiting   Oxycodone-Acetaminophen Other (See Comments)    Made fingers tingle and go numb, started "feeling weird".  Other reaction(s): Other Made fingers tingle and go numb, started "feeling weird".  Other reaction(s): tingle/numb fingers Other reaction(s): tingle/numb fingers   Tape Itching and Other (See Comments)    Bandaids - leaves red marks  Other reaction(s): itching    FAMILY HISTORY: Family History  Problem Relation Age of Onset   Emphysema Mother        smoker   Allergies Mother    Asthma Mother    Heart disease Mother        AMI as cause of death   COPD Mother    Asthma Father    Heart disease Father        AMI as cause of death   Diabetes Father    Asthma Brother    Heart disease Brother 57       heart failure   Lung cancer Maternal Grandfather        was a smoker   Cancer Maternal Grandfather        lung   Heart disease Paternal Grandmother        AMI   Heart disease Paternal Grandfather  AMI   Colon cancer Neg Hx       Objective:  Blood pressure 130/81, pulse 76, height 5\' 7"   (1.702 m), weight 160 lb (72.6 kg), last menstrual period 10/11/2010, SpO2 98 %. General: No acute distress.  Patient appears well-groomed.   Head:  Normocephalic/atraumatic Eyes:  Fundi examined but not visualized Heart:  Regular rate and rhythm Neurological Exam: alert and oriented.  Speech fluent and not dysarthric, language intact.  CN II-XII intact. Bulk and tone normal, muscle strength 5/5 throughout.  Very fine kinetic tremor on finger to nose.  Sensation to light touch intact.  Deep tendon reflexes 2+ throughout.  Gait normal, Romberg negative.   Shon Millet, DO  CC: Sharon Dimitri, MD

## 2022-12-01 ENCOUNTER — Ambulatory Visit: Payer: BC Managed Care – PPO | Admitting: Neurology

## 2022-12-03 NOTE — Progress Notes (Unsigned)
Cardiology Office Note:   Date:  12/05/2022  ID:  Sharon [name], DOB [birth year], MRN [id number]  History of Present Illness:   Sharon [name] is a 57 y.o. female mild OSA, anxiety, GERD, HTN, IBS, and prior PE who was referred by Mort Sawyers, FNP for further evaluation of HTN, bradycardia and palpitations.   Today, the patient is overall doing okay. States that she has been having episodes of palpitations that have been intermittent for the has been ongoing for several years but has worsened over the past couple of months. She has cut back on caffeine and this helped some but symptoms continue to occur. Symptoms are sporadic and not exertional in nature. Symptoms are brief and last a couple of seconds and then resolves. Has some associated chest tightness that occurs with palpitations but can occur at other times. States her chest discomfort can occur at rest and with exertion as well as with stress.  Also with continued dyspnea on exertion which has been ongoing for years. Symptoms worsened after COVID infection in 2021. No lightheadedness or syncope.  Family history of CAD: father at age 41, mom in her 29s, paternal GF with MI at 74  Struggles with significant knee pain and arthritis in her back which has significantly limited her mobility.   Past Medical History:  Diagnosis Date   Allergy    Anal fissure    Anemia    borderline   Anxiety    Arthritis    Blood clot in vein    left leg   Detached retina    BOTH SCLERA BUCKLE BOTH EYES   DVT (deep venous thrombosis) (HCC)    left leg   Endometriosis    Family history of adverse reaction to anesthesia    DAUGHTER POST OP PONV   GERD (gastroesophageal reflux disease)    Glaucoma    RIGHT WORST THAN LEFT   Heart murmur    "caused by anxiety"    History of hemorrhoids    History of kidney stones    Hypertension    IBS (irritable bowel syndrome)    Perforated eardrum, right    SMALL HOLE   PONV (postoperative nausea  and vomiting)    Pulmonary embolism (HCC) 6 YRS AGO   Status post arthroscopy of left knee 08/06/2017     ROS: As per HPI  Studies Reviewed:    EKG:  No new tracing today       Risk Assessment/Calculations:              Physical Exam:   VS:  BP 116/68   Pulse 68   Ht 5\' 7"  (1.702 m)   Wt 159 lb 9.6 oz (72.4 kg)   LMP 10/11/2010   SpO2 98%   BMI 25.00 kg/m    Wt Readings from Last 3 Encounters:  12/05/22 159 lb 9.6 oz (72.4 kg)  12/04/22 160 lb (72.6 kg)  10/24/22 159 lb (72.1 kg)     GEN: Well nourished, well developed in no acute distress NECK: No JVD; No carotid bruits CARDIAC: RRR, no murmurs, rubs, gallops RESPIRATORY:  Clear to auscultation without rales, wheezing or rhonchi  ABDOMEN: Soft, non-tender, non-distended EXTREMITIES:  No edema; No deformity   ASSESSMENT AND PLAN:   #Chest Pain: #DOE: #Family History of CAD: -Symptoms atypical and can occur at rest and with exertion  -She is very concerned about CAD given strong family history and she is worried something may be missed -Given symptoms and  history, will check coronary CTA and TTE for further evaluation  #Palpitations: -Has been ongoing for years with intermittent episodes of palpitations -Check 7 day zio monitor -Check TTE -Continue bystolic 5mg  daily  #HTN: -Controlled today -Continue bystolic 5mg  daily  #OSA: -On CPAP        Signed, Meriam Sprague, MD

## 2022-12-04 ENCOUNTER — Encounter: Payer: Self-pay | Admitting: Neurology

## 2022-12-04 ENCOUNTER — Ambulatory Visit: Payer: Medicare HMO | Admitting: Neurology

## 2022-12-04 VITALS — BP 130/81 | HR 76 | Ht 67.0 in | Wt 160.0 lb

## 2022-12-04 DIAGNOSIS — R4189 Other symptoms and signs involving cognitive functions and awareness: Secondary | ICD-10-CM

## 2022-12-04 DIAGNOSIS — G250 Essential tremor: Secondary | ICD-10-CM

## 2022-12-04 MED ORDER — PRIMIDONE 50 MG PO TABS: ORAL_TABLET | ORAL | 5 refills | Status: DC

## 2022-12-04 NOTE — Patient Instructions (Signed)
Increase primidone to 1 pill in morning and 3 pills at night.

## 2022-12-05 ENCOUNTER — Ambulatory Visit (INDEPENDENT_AMBULATORY_CARE_PROVIDER_SITE_OTHER): Payer: Medicare HMO

## 2022-12-05 ENCOUNTER — Ambulatory Visit: Payer: Medicare HMO | Attending: Cardiology | Admitting: Cardiology

## 2022-12-05 ENCOUNTER — Encounter: Payer: Self-pay | Admitting: Cardiology

## 2022-12-05 VITALS — BP 116/68 | HR 68 | Ht 67.0 in | Wt 159.6 lb

## 2022-12-05 DIAGNOSIS — R0602 Shortness of breath: Secondary | ICD-10-CM

## 2022-12-05 DIAGNOSIS — I272 Pulmonary hypertension, unspecified: Secondary | ICD-10-CM | POA: Diagnosis not present

## 2022-12-05 DIAGNOSIS — R072 Precordial pain: Secondary | ICD-10-CM | POA: Diagnosis not present

## 2022-12-05 DIAGNOSIS — R079 Chest pain, unspecified: Secondary | ICD-10-CM | POA: Diagnosis not present

## 2022-12-05 DIAGNOSIS — R002 Palpitations: Secondary | ICD-10-CM

## 2022-12-05 NOTE — Progress Notes (Unsigned)
Enrolled patient for a 7 day Zio XT monitor to be mailed to patients home.  

## 2022-12-05 NOTE — Patient Instructions (Signed)
Medication Instructions:   Your physician recommends that you continue on your current medications as directed. Please refer to the Current Medication list given to you today.  *If you need a refill on your cardiac medications before your next appointment, please call your pharmacy*   Testing/Procedures:  Your physician has requested that you have an echocardiogram. Echocardiography is a painless test that uses sound waves to create images of your heart. It provides your doctor with information about the size and shape of your heart and how well your heart's chambers and valves are working. This procedure takes approximately one hour. There are no restrictions for this procedure. Please do NOT wear cologne, perfume, aftershave, or lotions (deodorant is allowed). Please arrive 15 minutes prior to your appointment time.    ZIO XT- Long Term Monitor Instructions  Your physician has requested you wear a ZIO patch monitor for 7 days.  This is a single patch monitor. [employer] supplies one patch monitor per enrollment. Additional stickers are not available. Please do not apply patch if you will be having a Nuclear Stress Test,  Echocardiogram, Cardiac CT, MRI, or Chest Xray during the period you would be wearing the  monitor. The patch cannot be worn during these tests. You cannot remove and re-apply the  ZIO XT patch monitor.  Your ZIO patch monitor will be mailed 3 day USPS to your address on file. It may take 3-5 days  to receive your monitor after you have been enrolled.  Once you have received your monitor, please review the enclosed instructions. Your monitor  has already been registered assigning a specific monitor serial # to you.  Billing and Patient Assistance Program Information  We have supplied [employer] with any of your insurance information on file for billing purposes. [employer] offers a sliding scale Patient Assistance Program for patients that do not have  insurance, or whose  insurance does not completely cover the cost of the ZIO monitor.  You must apply for the Patient Assistance Program to qualify for this discounted rate.  To apply, please call [employer] at [phone], select option 4, select option 2, ask to apply for  Patient Assistance Program. Meredeth Ide will ask your household income, and how many people  are in your household. They will quote your out-of-pocket cost based on that information.  [employer] will also be able to set up a 31-month, interest-free payment plan if needed.  Applying the monitor   Shave hair from upper left chest.  Hold abrader disc by orange tab. Rub abrader in 40 strokes over the upper left chest as  indicated in your monitor instructions.  Clean area with 4 enclosed alcohol pads. Let dry.  Apply patch as indicated in monitor instructions. Patch will be placed under collarbone on left  side of chest with arrow pointing upward.  Rub patch adhesive wings for 2 minutes. Remove white label marked "1". Remove the white  label marked "2". Rub patch adhesive wings for 2 additional minutes.  While looking in a mirror, press and release button in center of patch. A small green light will  flash 3-4 times. This will be your only indicator that the monitor has been turned on.  Do not shower for the first 24 hours. You may shower after the first 24 hours.  Press the button if you feel a symptom. You will hear a small click. Record Date, Time and  Symptom in the Patient Logbook.  When you are ready to remove the patch, follow instructions  on the last 2 pages of Patient  Logbook. Stick patch monitor onto the last page of Patient Logbook.  Place Patient Logbook in the blue and white box. Use locking tab on box and tape box closed  securely. The blue and white box has prepaid postage on it. Please place it in the mailbox as  soon as possible. Your physician should have your test results approximately 7 days after the  monitor has been mailed back  to [facility].  Call [facility] Customer Care at [phone] if you have questions regarding  your ZIO XT patch monitor. Call them immediately if you see an orange light blinking on your  monitor.  If your monitor falls off in less than 4 days, contact our Monitor department at [phone].  If your monitor becomes loose or falls off after 4 days call [employer] at [phone] for  suggestions on securing your monitor     Your cardiac CT will be scheduled at one of the below locations:   [facility] [address] [place], [place] [address] [phone]   If scheduled at [facility], please arrive at the [facility] and Children's Entrance (Entrance C2) of [facility] 30 minutes prior to test start time. You can use the FREE valet parking offered at entrance C (encouraged to control the heart rate for the test)  Proceed to the [facility] Radiology Department (first floor) to check-in and test prep.  All radiology patients and guests should use entrance C2 at [facility], accessed from [facility], even though the hospital's physical address listed is [address].      Please follow these instructions carefully (unless otherwise directed):  Hold all erectile dysfunction medications at least 3 days (72 hrs) prior to test. (Ie viagra, cialis, sildenafil, tadalafil, etc) We will administer nitroglycerin during this exam.   On the Night Before the Test: Be sure to Drink plenty of water. Do not consume any caffeinated/decaffeinated beverages or chocolate 12 hours prior to your test. Do not take any antihistamines 12 hours prior to your test.   On the Day of the Test: Drink plenty of water until 1 hour prior to the test. Do not eat any food 1 hour prior to test. You may take your regular medications prior to the test.  Take BYSTOLIC 10 MG BY MOUTH two hours prior to test.  FEMALES- please wear underwire-free bra  if available, avoid dresses & tight clothing        After the Test: Drink plenty of water. After receiving IV contrast, you may experience a mild flushed feeling. This is normal. On occasion, you may experience a mild rash up to 24 hours after the test. This is not dangerous. If this occurs, you can take Benadryl 25 mg and increase your fluid intake. If you experience trouble breathing, this can be serious. If it is severe call 911 IMMEDIATELY. If it is mild, please call our office.   We will call to schedule your test 2-4 weeks out understanding that some insurance companies will need an authorization prior to the service being performed.   For non-scheduling related questions, please contact the cardiac imaging nurse navigator should you have any questions/concerns: Rockwell Alexandria, Cardiac Imaging Nurse Navigator Larey Brick, Cardiac Imaging Nurse Navigator [facility] Direct Office Dial: [phone]   For scheduling needs, including cancellations and rescheduling, please call [place], [phone].     Follow-Up: At [facility],  you and your health needs are our priority.  As part of our continuing mission to provide you with exceptional heart care, we have created designated Provider Care Teams.  These Care Teams include your primary Cardiologist (physician) and Advanced Practice Providers (APPs -  Physician Assistants and Nurse Practitioners) who all work together to provide you with the care you need, when you need it.  We recommend signing up for the patient portal called "MyChart".  Sign up information is provided on this After Visit Summary.  MyChart is used to connect with patients for Virtual Visits (Telemedicine).  Patients are able to view lab/test results, encounter notes, upcoming appointments, etc.  Non-urgent messages can be sent to your provider as well.   To learn more about what you can do with MyChart, go to  [email or web address].    Your next appointment:   6 month(s)  Provider:   DR. Anne Fu

## 2022-12-07 ENCOUNTER — Encounter (HOSPITAL_COMMUNITY): Payer: Self-pay

## 2022-12-08 ENCOUNTER — Telehealth: Payer: Self-pay | Admitting: *Deleted

## 2022-12-08 ENCOUNTER — Other Ambulatory Visit: Payer: Self-pay

## 2022-12-08 ENCOUNTER — Other Ambulatory Visit: Payer: Self-pay | Admitting: Neurology

## 2022-12-08 NOTE — Telephone Encounter (Signed)
-----   Message from Lorrin Jackson sent at 12/07/2022  3:05 PM EDT ----- Scheduled 12/11/21 at 3:00

## 2022-12-08 NOTE — Telephone Encounter (Signed)
Patient has not received from our office in the past. Ok to refill as pended.

## 2022-12-11 ENCOUNTER — Telehealth (HOSPITAL_COMMUNITY): Payer: Self-pay | Admitting: *Deleted

## 2022-12-11 ENCOUNTER — Other Ambulatory Visit: Payer: Medicare HMO

## 2022-12-11 ENCOUNTER — Other Ambulatory Visit: Payer: Self-pay | Admitting: Obstetrics and Gynecology

## 2022-12-11 ENCOUNTER — Ambulatory Visit
Admission: RE | Admit: 2022-12-11 | Discharge: 2022-12-11 | Disposition: A | Discharge status: Home / Self Care | Payer: Medicare HMO | Source: Ambulatory Visit | Attending: Obstetrics and Gynecology | Admitting: Obstetrics and Gynecology

## 2022-12-11 DIAGNOSIS — R928 Other abnormal and inconclusive findings on diagnostic imaging of breast: Secondary | ICD-10-CM

## 2022-12-11 DIAGNOSIS — N6489 Other specified disorders of breast: Secondary | ICD-10-CM

## 2022-12-11 DIAGNOSIS — N6322 Unspecified lump in the left breast, upper inner quadrant: Secondary | ICD-10-CM | POA: Diagnosis not present

## 2022-12-11 NOTE — Telephone Encounter (Signed)
Reaching out to patient to offer assistance regarding upcoming cardiac imaging study; pt verbalizes understanding of appt date/time, parking situation and where to check in, pre-test NPO status and medications ordered, and verified current allergies; name and call back number provided for further questions should they arise Johney Frame RN Navigator Cardiac Imaging [facility] [phone] office [phone] cell

## 2022-12-12 ENCOUNTER — Telehealth: Payer: Self-pay | Admitting: Cardiology

## 2022-12-12 ENCOUNTER — Ambulatory Visit (HOSPITAL_COMMUNITY)
Admission: RE | Admit: 2022-12-12 | Discharge: 2022-12-12 | Disposition: A | Discharge status: Home / Self Care | Payer: Medicare HMO | Source: Ambulatory Visit | Attending: Cardiology | Admitting: Cardiology

## 2022-12-12 DIAGNOSIS — R079 Chest pain, unspecified: Secondary | ICD-10-CM | POA: Diagnosis not present

## 2022-12-12 DIAGNOSIS — R072 Precordial pain: Secondary | ICD-10-CM | POA: Diagnosis not present

## 2022-12-12 DIAGNOSIS — R0602 Shortness of breath: Secondary | ICD-10-CM | POA: Insufficient documentation

## 2022-12-12 DIAGNOSIS — R002 Palpitations: Secondary | ICD-10-CM | POA: Diagnosis not present

## 2022-12-12 MED ORDER — NITROGLYCERIN 0.4 MG SL SUBL
0.8000 mg | SUBLINGUAL_TABLET | Freq: Once | SUBLINGUAL | Status: AC
  Administered 2022-12-12: 0.8 mg via SUBLINGUAL

## 2022-12-12 MED ORDER — NITROGLYCERIN 0.4 MG SL SUBL
SUBLINGUAL_TABLET | SUBLINGUAL | Status: AC
  Filled 2022-12-12: qty 2

## 2022-12-12 MED ORDER — IOHEXOL 350 MG/ML SOLN
95.0000 mL | Freq: Once | INTRAVENOUS | Status: AC | PRN
  Administered 2022-12-12: 95 mL via INTRAVENOUS
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
## 2022-12-12 NOTE — Telephone Encounter (Signed)
Called the pt back and informed her that yes, she should not put the monitor on, until her imaging appt and breast biopsy appt is complete.   Advised her to reach out to the monitor company to make them aware of these appts and that she will be placing the monitor after these appts are complete early next week.  Pt verbalized understanding and agrees with this plan.

## 2022-12-12 NOTE — Telephone Encounter (Signed)
Spoke with the pt.   She said she forgot to stop and get her lab done for her CTA today.  She also asked if there was any contraindication with getting the CTA done today and having chronic knee pain/history of blood clot in the leg.  Informed the pt that there is no contraindication for this and she is safe to proceed with scheduled cardiac CT today at 3 pm.  Advised her to make sure she follows the instructions provided to her at the last OV.   Informed her that if CT needs the BMET done for the CTA today, they can draw it then at the bedside.   Pt verbalized understanding and agrees with this plan. Pt was more than gracious for all the assistance provided.

## 2022-12-12 NOTE — Telephone Encounter (Signed)
Patient states she has a left breast biopsy scheduled for Monday. She plans to place event monitor tonight and she would like to know if she needs to wait until after her procedure. Please advise.

## 2022-12-14 DIAGNOSIS — G4733 Obstructive sleep apnea (adult) (pediatric): Secondary | ICD-10-CM | POA: Diagnosis not present

## 2022-12-18 DIAGNOSIS — G4733 Obstructive sleep apnea (adult) (pediatric): Secondary | ICD-10-CM | POA: Diagnosis not present

## 2022-12-19 ENCOUNTER — Ambulatory Visit
Admission: RE | Admit: 2022-12-19 | Discharge: 2022-12-19 | Disposition: A | Discharge status: Home / Self Care | Payer: Medicare HMO | Source: Ambulatory Visit | Attending: Obstetrics and Gynecology | Admitting: Obstetrics and Gynecology

## 2022-12-19 DIAGNOSIS — R928 Other abnormal and inconclusive findings on diagnostic imaging of breast: Secondary | ICD-10-CM

## 2022-12-19 DIAGNOSIS — N6489 Other specified disorders of breast: Secondary | ICD-10-CM

## 2022-12-19 DIAGNOSIS — N6322 Unspecified lump in the left breast, upper inner quadrant: Secondary | ICD-10-CM | POA: Diagnosis not present

## 2022-12-19 HISTORY — PX: BREAST BIOPSY: SHX20

## 2022-12-23 ENCOUNTER — Other Ambulatory Visit: Payer: Self-pay | Admitting: Family

## 2022-12-23 DIAGNOSIS — L409 Psoriasis, unspecified: Secondary | ICD-10-CM

## 2022-12-26 ENCOUNTER — Ambulatory Visit: Payer: Medicare HMO | Admitting: Primary Care

## 2022-12-26 ENCOUNTER — Encounter: Payer: Self-pay | Admitting: Primary Care

## 2022-12-26 VITALS — BP 142/78 | HR 75 | Temp 99.0°F | Ht 67.0 in | Wt 162.8 lb

## 2022-12-26 DIAGNOSIS — G4730 Sleep apnea, unspecified: Secondary | ICD-10-CM | POA: Diagnosis not present

## 2022-12-26 DIAGNOSIS — H401131 Primary open-angle glaucoma, bilateral, mild stage: Secondary | ICD-10-CM | POA: Diagnosis not present

## 2022-12-26 NOTE — Patient Instructions (Signed)
Sleep apnea is well controlled on CPAP, no significant residual apneas Continue to wear CPAP nightly No changes recommended  Try nasal moisture spray/ARY gel  Follow-up 6 months with Beth NP or sooner if needed  CPAP and BIPAP Information CPAP and BIPAP are methods that use air pressure to keep your airways open and to help you breathe well. CPAP and BIPAP use different amounts of pressure. Your health care provider will tell you whether CPAP or BIPAP would be more helpful for you. CPAP stands for "continuous positive airway pressure." With CPAP, the amount of pressure stays the same while you breathe in (inhale) and out (exhale). BIPAP stands for "bi-level positive airway pressure." With BIPAP, the amount of pressure will be higher when you inhale and lower when you exhale. This allows you to take larger breaths. CPAP or BIPAP may be used in the hospital, or your health care provider may want you to use it at home. You may need to have a sleep study before your health care provider can order a machine for you to use at home. What are the advantages? CPAP or BIPAP can be helpful if you have: Sleep apnea. Chronic obstructive pulmonary disease (COPD). Heart failure. Medical conditions that cause muscle weakness, including muscular dystrophy or amyotrophic lateral sclerosis (ALS). Other problems that cause breathing to be shallow, weak, abnormal, or difficult. CPAP and BIPAP are most commonly used for obstructive sleep apnea (OSA) to keep the airways from collapsing when the muscles relax during sleep. What are the risks? Generally, this is a safe treatment. However, problems may occur, including: Irritated skin or skin sores if the mask does not fit properly. Dry or stuffy nose or nosebleeds. Dry mouth. Feeling gassy or bloated. Sinus or lung infection if the equipment is not cleaned properly. When should CPAP or BIPAP be used? In most cases, the mask only needs to be worn during sleep.  Generally, the mask needs to be worn throughout the night and during any daytime naps. People with certain medical conditions may also need to wear the mask at other times, such as when they are awake. Follow instructions from your health care provider about when to use the machine. What happens during CPAP or BIPAP?  Both CPAP and BIPAP are provided by a small machine with a flexible plastic tube that attaches to a plastic mask that you wear. Air is blown through the mask into your nose or mouth. The amount of pressure that is used to blow the air can be adjusted on the machine. Your health care provider will set the pressure setting and help you find the best mask for you. Tips for using the mask Because the mask needs to be snug, some people feel trapped or closed-in (claustrophobic) when first using the mask. If you feel this way, you may need to get used to the mask. One way to do this is to hold the mask loosely over your nose or mouth and then gradually apply the mask more snugly. You can also gradually increase the amount of time that you use the mask. Masks are available in various types and sizes. If your mask does not fit well, talk with your health care provider about getting a different one. Some common types of masks include: Full face masks, which fit over the mouth and nose. Nasal masks, which fit over the nose. Nasal pillow or prong masks, which fit into the nostrils. If you are using a mask that fits over your nose  and you tend to breathe through your mouth, a chin strap may be applied to help keep your mouth closed. Use a skin barrier to protect your skin as told by your health care provider. Some CPAP and BIPAP machines have alarms that may sound if the mask comes off or develops a leak. If you have trouble with the mask, it is very important that you talk with your health care provider about finding a way to make the mask easier to tolerate. Do not stop using the mask. There could  be a negative impact on your health if you stop using the mask. Tips for using the machine Place your CPAP or BIPAP machine on a secure table or stand near an electrical outlet. Know where the on/off switch is on the machine. Follow instructions from your health care provider about how to set the pressure on your machine and when you should use it. Do not eat or drink while the CPAP or BIPAP machine is on. Food or fluids could get pushed into your lungs by the pressure of the CPAP or BIPAP. For home use, CPAP and BIPAP machines can be rented or purchased through home health care companies. Many different brands of machines are available. Renting a machine before purchasing may help you find out which particular machine works well for you. Your health insurance company may also decide which machine you may get. Keep the CPAP or BIPAP machine and attachments clean. Ask your health care provider for specific instructions. Check the humidifier if you have a dry stuffy nose or nosebleeds. Make sure it is working correctly. Follow these instructions at home: Take over-the-counter and prescription medicines only as told by your health care provider. Ask if you can take sinus medicine if your sinuses are blocked. Do not use any products that contain nicotine or tobacco. These products include cigarettes, chewing tobacco, and vaping devices, such as e-cigarettes. If you need help quitting, ask your health care provider. Keep all follow-up visits. This is important. Contact a health care provider if: You have redness or pressure sores on your head, face, mouth, or nose from the mask or head gear. You have trouble using the CPAP or BIPAP machine. You cannot tolerate wearing the CPAP or BIPAP mask. Someone tells you that you snore even when wearing your CPAP or BIPAP. Get help right away if: You have trouble breathing. You feel confused. Summary CPAP and BIPAP are methods that use air pressure to keep your  airways open and to help you breathe well. If you have trouble with the mask, it is very important that you talk with your health care provider about finding a way to make the mask easier to tolerate. Do not stop using the mask. There could be a negative impact to your health if you stop using the mask. Follow instructions from your health care provider about when to use the machine. This information is not intended to replace advice given to you by your health care provider. Make sure you discuss any questions you have with your health care provider. Document Revised: 01/12/2021 Document Reviewed: 05/14/2020 [employer]vier Patient Education  2023 [employer].

## 2022-12-26 NOTE — Progress Notes (Signed)
Reviewed and agree with assessment/plan.   Coralyn Helling, MD [facility] Pulmonary/Critical Care 12/26/2022, 3:14 PM Pager:  [phone]

## 2022-12-26 NOTE — Progress Notes (Signed)
@Patient  ID: Sharon [name], female    DOB: 12-16-1965, 57 y.o.   MRN: [id number]  Chief Complaint  Patient presents with   Follow-up    CPAP compliance visit.  Patient turned up humidity d/t nasal and mouth dryness    Referring provider: Mort Sawyers, FNP  HPI: 57 year old female, never smoked.  Past medical history significant for hypertension, reactive airway disease, GERD, endometriosis, iron deficiency, insomnia.  PSG 05/12/2015 showed no evidence of obstructive sleep apnea, AHI 0 with SPO2 low 92%. Weight 149lbs.   Previous [facility] pulmonary encounter: 03/17/2022 Patient presents today for sleep consult. She had sleep study in 2016 that was negative for sleep apnea. She continues to have snoring symptoms and waking up at night feeling short of breath. Her weight is up 20-30 lbs since last study. Typical bedtime is between 10pm-12am. It can take her 30-60 mins to fall asleep. She wakes up several times a night. She starts her day between 6:30am and 10am. Denies symptoms of narcolepsy, cataplexy or sleep walking.   In addition to poor sleep she has symptoms of shortness of breath during the daytime, nocturnal chest tightness, np cough and indigestion. She has never smoked. No PFTs on file. She reports symptoms of reflux and throat irritation/inflammation. She was not able to take famotidine at night but does take 20mg  in the morning.   Sleep questionnaire Symptoms-  cant breath/waking up at night, inflammation in throat  Prior sleep study- 2016 Bedtime- 10pm-12am Time to fall asleep- 30-13min  Nocturnal awakenings- 3-5 times Out of bed/start of day- 6:30am-10am Weight changes- 30 lbs Do you operate heavy machinery- No Do you currently wear CPAP- No Do you current wear oxygen- No Epworth- 10  07/26/2022 Patient presents today to review sleep study results.  Patient was seen for sleep consult September.  She reported having symptoms of snoring and waking up at night short of  breath. She had home sleep study on 07/02/2022 that showed evidence of mild obstructive sleep apnea, AHI 7.1 an hour with SpO2 low 68% (average 93%).   She has a hard time getting out of bed in the morning d.t fatigue. She feels exhausted during the day. She does not fall asleep driving. It is hard for her to sleep on her side, she has been sleeping on her back since having knee surgery. She just received disability.    09/26/2022 Patient presents today for OSA follow-up.  Patient had a home sleep study on 07/02/2022 that showed evidence of mild obstructive sleep apnea, AHI 7.1 an hour.  She was started on auto CPAP 5 to 15 cm H2O. She is doing well, she is 90% compliant with CPAP use > 4 hours over the last 30 days. She does not feel as tired and she is sleeping better. She is no longer having vivid dreams/nightmares. She is using nasal cradle mask, at times she has a hard time breathing out when wearing her mask. She was provided a chin strap. We discussed adjusting her pressure settings but she does not want to make any changes today.   Airview download 08/26/22-09/24/22 30/30 days (100%); 27 days (90%) > 4 hours Average usage 6 hours 1 minutes Pressure 5-15cm h20 Airleaks 8.2L/min  AHI 2.4   12/26/2022- Interim hx  Patient presents today for 50-month follow-up OSA.  She is doing well. She is 100% compliant with CPAP use and reports benefit from therapy.  Sleeping much better doing well. She is not requiring as much sleep. No longer  snoring or waking up gasping or choking when wearing CPAP.  She switched to a full nasal mask, not needing chinstrap. Dry mouth improved by changing to full nasal mask. No longer sleeping with her mouth open, has not required chin strap.   Airview download 11/25/2022 - 12/24/2022 30/30 days (100%); 29 days (97%) greater than 4 hours Average usage 6 hours 12 minutes Pressure 5 to 15 cm H2O (8.6cm h20- 95%) Airleaks 13.2L/min AHI 2.2   Allergies  Allergen Reactions   Bactrim  [Sulfamethoxazole-Trimethoprim] Shortness Of Breath   Bee Venom Anaphylaxis and Other (See Comments)   Penicillins Other (See Comments) and Swelling    Reaction unknown from childhood Has patient had a PCN reaction causing immediate rash, facial/tongue/throat swelling, SOB or lightheadedness with hypotension: unknown Has patient had a PCN reaction causing severe rash involving mucus membranes or skin necrosis: unknown  Has patient had a PCN reaction that required hospitalization: no Has patient had a PCN reaction occurring within the last 10 years: no If all of the above answers are "NO", then may proceed with Cephalosporin use.  Other reaction(s): Other, Unknown Throat swells Throat swells Reaction unknown from childhood Has patient had a PCN reaction causing immediate rash, facial/tongue/throat swelling, SOB or lightheadedness with hypotension: unknown Has patient had a PCN reaction causing severe rash involving mucus membranes or skin necrosis: unknown  Has patient had a PCN reaction that required hospitalization: no Has patient had a PCN reaction occurring within the last 10 years: no If all of the above answers are "NO", then may proceed with Cephalosporin use.  Other reaction(s): Not available   Dextromethorphan     Other reaction(s): Lump in throat Other reaction(s): Lump in throat   Elavil [Amitriptyline] Other (See Comments)    Dry mouth   Hydrocodone-Acetaminophen Other (See Comments)   Oxycodone Other (See Comments)   Penicillin G     Other reaction(s): Unknown   Dextromethorphan Polistirex Er Other (See Comments)    Pt states caused "a lump" in her throat, making it difficult to swallow   Fluconazole     Primidone interaction Other reaction(s): Primidone interaction Other reaction(s): Primidone interaction   Hydrocodone Nausea And Vomiting and Nausea Only    Other reaction(s): nausea and vomiting Other reaction(s): nausea and vomiting   Oxycodone-Acetaminophen Other  (See Comments)    Made fingers tingle and go numb, started "feeling weird".  Other reaction(s): Other Made fingers tingle and go numb, started "feeling weird".  Other reaction(s): tingle/numb fingers Other reaction(s): tingle/numb fingers   Tape Itching and Other (See Comments)    Bandaids - leaves red marks  Other reaction(s): itching    Immunization History  Administered Date(s) Administered   Influenza Whole 03/19/2010   Influenza,inj,Quad PF,6+ Mos 03/25/2015, 04/13/2021   Influenza-Unspecified 03/19/2018   Moderna Sars-Covid-2 Vaccination 06/21/2019, 07/19/2019, 08/06/2020   Tdap 11/08/2016    Past Medical History:  Diagnosis Date   Allergy    Anal fissure    Anemia    borderline   Anxiety    Arthritis    Blood clot in vein    left leg   Detached retina    BOTH SCLERA BUCKLE BOTH EYES   DVT (deep venous thrombosis) (HCC)    left leg   Endometriosis    Family history of adverse reaction to anesthesia    DAUGHTER POST OP PONV   GERD (gastroesophageal reflux disease)    Glaucoma    RIGHT WORST THAN LEFT   Heart murmur    "  caused by anxiety"    History of hemorrhoids    History of kidney stones    Hypertension    IBS (irritable bowel syndrome)    Perforated eardrum, right    SMALL HOLE   PONV (postoperative nausea and vomiting)    Pulmonary embolism (HCC) 6 YRS AGO   Status post arthroscopy of left knee 08/06/2017    Tobacco History: Social History   Tobacco Use  Smoking Status Never  Smokeless Tobacco Never   Counseling given: Not Answered   Outpatient Medications Prior to Visit  Medication Sig Dispense Refill   Acetylcysteine (NAC) 600 MG CAPS Take 600 mg by mouth 2 (two) times daily.     acyclovir (ZOVIRAX) 400 MG tablet Take 1 tablet by mouth daily.     albuterol (VENTOLIN HFA) 108 (90 Base) MCG/ACT inhaler INHALE 1-2 PUFFS BY MOUTH EVERY 6 HOURS AS NEEDED FOR WHEEZE OR SHORTNESS OF BREATH 8.5 each 2   bimatoprost (LUMIGAN) 0.03 % ophthalmic  solution Place 1 drop into both eyes at bedtime.     Cholecalciferol (VITAMIN D) 50 MCG (2000 UT) tablet 1 tablet     clobetasol (TEMOVATE) 0.05 % external solution APPLY TO AFFECTED AREA TWICE A DAY 50 mL 0   EPINEPHrine 0.3 mg/0.3 mL IJ SOAJ injection Inject 0.3 mg into the muscle as needed for anaphylaxis. 1 each 0   estradiol (ESTRACE) 0.1 MG/GM vaginal cream Insert pea-sized amount nightly for 2 weeks then every other night     famotidine (PEPCID) 40 MG tablet Take 1 tablet (40 mg total) by mouth daily. 90 tablet 1   famotidine (PEPCID) 40 MG tablet Take 1 tablet by mouth daily.     gabapentin (NEURONTIN) 100 MG capsule Take 2 capsules (200 mg total) by mouth at bedtime. 180 capsule 1   gabapentin (NEURONTIN) 600 MG tablet Take 1 tablet (600 mg total) by mouth 3 (three) times daily. 90 tablet 3   lidocaine (LIDODERM) 5 % Apply 1 patch topically daily.     LUMIGAN 0.01 % SOLN 1 drop at bedtime.     meloxicam (MOBIC) 15 MG tablet Take 1 tablet (15 mg total) by mouth daily. 30 tablet 2   methocarbamol (ROBAXIN) 500 MG tablet Take 1 tablet (500 mg total) by mouth every 8 (eight) hours as needed for muscle spasms. 90 tablet 1   mometasone (NASONEX) 50 MCG/ACT nasal spray Place 2 sprays into the nose daily. 1 each 12   nebivolol (BYSTOLIC) 5 MG tablet TAKE 1 TABLET BY MOUTH EVERY DAY 90 tablet 3   nitroGLYCERIN (NITROSTAT) 0.4 MG SL tablet Place 1 tablet (0.4 mg total) under the tongue every 5 (five) minutes as needed for chest pain. 30 tablet 12   Nitroglycerin (RECTIV) 0.4 % OINT Place 0.4 inches rectally 2 (two) times daily as needed (For anal fissures.). 30 g 1   ondansetron (ZOFRAN-ODT) 4 MG disintegrating tablet 1 tablet on the tongue and allow to dissolve Orally Once a day for 30 day(s) 20 tablet 0   pantoprazole (PROTONIX) 20 MG tablet Take 1 tablet (20 mg total) by mouth daily. 90 tablet 3   polyethylene glycol powder (GLYCOLAX/MIRALAX) 17 GM/SCOOP powder See admin instructions.      primidone (MYSOLINE) 50 MG tablet Take 1 tablet in morning and 3 tablets at bedtime. 120 tablet 5   traZODone (DESYREL) 50 MG tablet Take 1 tablet (50 mg total) by mouth at bedtime as needed for sleep. 90 tablet 3   vitamin B-12 (CYANOCOBALAMIN)  1000 MCG tablet Take 1 tablet (1,000 mcg total) by mouth daily. 30 tablet 3   Zinc 50 MG TABS 1 tablet Orally Once a day for 30 day(s)     No facility-administered medications prior to visit.      Review of Systems  Review of Systems  Constitutional: Negative.  Negative for fatigue.  Respiratory: Negative.  Negative for apnea and shortness of breath.   Cardiovascular: Negative.   Psychiatric/Behavioral:  Negative for sleep disturbance.    Physical Exam  BP (!) 142/78 (BP Location: Right Arm, Patient Position: Sitting, Cuff Size: Normal)   Pulse 75   Temp 99 F (37.2 C) (Oral)   Ht 5\' 7"  (1.702 m)   Wt 162 lb 12.8 oz (73.8 kg)   LMP 10/11/2010   SpO2 98%   BMI 25.50 kg/m  Physical Exam Constitutional:      Appearance: Normal appearance.  Neurological:     Mental Status: She is alert.      Lab Results:  CBC    Component Value Date/Time   WBC 5.8 08/25/2022 1102   RBC 4.53 08/25/2022 1102   HGB 13.2 08/25/2022 1102   HGB 12.0 01/20/2019 1649   HCT 38.9 08/25/2022 1102   HCT 35.5 01/20/2019 1649   PLT 293.0 08/25/2022 1102   PLT 293 01/20/2019 1649   MCV 85.9 08/25/2022 1102   MCV 84.7 06/09/2019 1451   MCV 87 01/20/2019 1649   MCH 30.9 11/08/2019 0737   MCHC 34.0 08/25/2022 1102   RDW 14.1 08/25/2022 1102   RDW 12.6 01/20/2019 1649   LYMPHSABS 1.6 06/07/2020 1625   LYMPHSABS 1.7 07/19/2018 1321   MONOABS 0.5 06/07/2020 1625   EOSABS 0.1 06/07/2020 1625   EOSABS 0.2 07/19/2018 1321   BASOSABS 0.1 06/07/2020 1625   BASOSABS 0.0 07/19/2018 1321    BMET    Component Value Date/Time   NA 140 08/25/2022 1102   NA 143 07/11/2022 1345   K 4.6 08/25/2022 1102   CL 104 08/25/2022 1102   CO2 31 08/25/2022 1102    GLUCOSE 97 08/25/2022 1102   BUN 15 08/25/2022 1102   BUN 13 07/11/2022 1345   CREATININE 0.75 08/25/2022 1102   CREATININE 0.73 10/21/2015 1919   CALCIUM 9.9 08/25/2022 1102   GFRNONAA >60 11/08/2019 0737   GFRAA >60 11/08/2019 0737    BNP No results found for: "BNP"  ProBNP No results found for: "PROBNP"  Imaging: MM CLIP PLACEMENT LEFT  Result Date: 12/19/2022 CLINICAL DATA:  Status post ultrasound biopsy left breast mass EXAM: 3D DIAGNOSTIC LEFT MAMMOGRAM POST ULTRASOUND BIOPSY COMPARISON:  Previous exam(s). FINDINGS: 3D Mammographic images were obtained following ultrasound guided biopsy of left breast mass 11 o'clock position. The biopsy marking clip is in expected position at the site of biopsy. IMPRESSION: Appropriate positioning of the ribbon shaped biopsy marking clip at the site of biopsy in the left breast mass 11 o'clock position. Final Assessment: Post Procedure Mammograms for Marker Placement Electronically Signed   By: Annia Belt M.D.   On: 12/19/2022 15:01  [place] LT BREAST BX W LOC DEV 1ST LESION IMG BX SPEC US GUIDE  Result Date: 12/19/2022 CLINICAL DATA:  Indeterminate left breast mass. EXAM: ULTRASOUND GUIDED LEFT BREAST CORE NEEDLE BIOPSY COMPARISON:  Previous exam(s). PROCEDURE: I met with the patient and we discussed the procedure of ultrasound-guided biopsy, including benefits and alternatives. We discussed the high likelihood of a successful procedure. We discussed the risks of the procedure, including infection, bleeding, tissue  injury, clip migration, and inadequate sampling. Informed written consent was given. The usual time-out protocol was performed immediately prior to the procedure. Lesion quadrant: Upper inner quadrant Using sterile technique and 1% Lidocaine as local anesthetic, under direct ultrasound visualization, a 14 gauge spring-loaded device was used to perform biopsy of left breast mass 11 o'clock position using a medial approach. At the conclusion of the  procedure ribbon shaped tissue marker clip was deployed into the biopsy cavity. Follow up 2 view mammogram was performed and dictated separately. IMPRESSION: Ultrasound guided biopsy of left breast mass 11 o'clock position. No apparent complications. Electronically Signed   By: Annia Belt M.D.   On: 12/19/2022 14:40  CT CORONARY MORPH W/CTA COR W/SCORE W/CA W/CM &/OR WO/CM  Addendum Date: 12/17/2022   ADDENDUM REPORT: 12/17/2022 07:44 EXAM: OVER-READ INTERPRETATION  CT CHEST The following report is an over-read performed by radiologist Dr. Noe Gens [facility] on 12/17/2022. This over-read does not include interpretation of cardiac or coronary anatomy or pathology. The coronary CTA interpretation by the cardiologist is attached. COMPARISON:  01/17/2022 FINDINGS: Heart is normal size. Aorta normal caliber. No adenopathy. Dependent atelectasis in the lower lobes. No confluent opacities or effusions. No acute findings in the upper abdomen. Chest wall soft tissues are unremarkable. No acute bony abnormality. IMPRESSION: No acute or significant extracardiac abnormality. Electronically Signed   By: Charlett Nose M.D.   On: 12/17/2022 07:44   Result Date: 12/17/2022 CLINICAL DATA:  57 yo female with chest pain EXAM: Cardiac/Coronary CTA TECHNIQUE: A non-contrast, gated CT scan was obtained with axial slices of 3 mm through the heart for calcium scoring. Calcium scoring was performed using the Agatston method. A 120 kV prospective, gated, contrast cardiac scan was obtained. Gantry rotation speed was 250 msecs and collimation was 0.6 mm. Two sublingual nitroglycerin tablets (0.8 mg) were given. The 3D data set was reconstructed in 5% intervals of the 35-75% of the R-R cycle. Diastolic phases were analyzed on a dedicated workstation using MPR, MIP, and VRT modes. The patient received 95 cc of contrast. FINDINGS: Image quality: Excellent. Noise artifact is: Limited. Coronary Arteries:  Normal coronary  origin.  Right dominance. Left main: The left main is a large caliber vessel with a normal take off from the left coronary cusp that trifurcates into a LAD, LCX, and ramus intermedius. There is no plaque or stenosis. Left anterior descending artery: The LAD is patent without evidence of plaque or stenosis. The LAD gives off 1 patent diagonal branche. Ramus intermedius: Patent with no evidence of plaque or stenosis. Left circumflex artery: The LCX is non-dominant and patent with no evidence of plaque or stenosis. The LCX gives off 1 patent obtuse marginal branch. Right coronary artery: The RCA is dominant with normal take off from the right coronary cusp. There is no evidence of plaque or stenosis. The RCA terminates as a PDA and right posterolateral branch without evidence of plaque or stenosis. Right Atrium: Right atrial size is within normal limits. Right Ventricle: The right ventricular cavity is within normal limits. Left Atrium: Left atrial size is normal in size with no left atrial appendage filling defect. Left Ventricle: The ventricular cavity size is within normal limits. There are no stigmata of prior infarction. There is no abnormal filling defect. Pulmonary arteries: Normal in size without proximal filling defect. Pulmonary veins: Normal pulmonary venous drainage. Pericardium: Normal thickness with no significant effusion or calcium present. Cardiac valves: The aortic valve is trileaflet without significant calcification. The  mitral valve is normal structure without significant calcification. Aorta: Normal caliber with no significant disease. Extra-cardiac findings: See attached radiology report for non-cardiac structures. IMPRESSION: 1. Coronary calcium score of 0. This was 0 percentile for age-, sex, and race-matched controls. 2. Normal coronary origin with right dominance. 3. No evidence of CAD. RECOMMENDATIONS: CAD-RADS 0: No evidence of CAD (0%). Consider non-atherosclerotic causes of chest pain.  Olga Millers, MD Electronically Signed: By: Olga Millers M.D. On: 12/12/2022 17:45   MM 3D DIAGNOSTIC MAMMOGRAM UNILATERAL LEFT BREAST  Result Date: 12/11/2022 CLINICAL DATA:  The patient was called for a breast asymmetry EXAM: DIGITAL DIAGNOSTIC UNILATERAL LEFT MAMMOGRAM WITH TOMOSYNTHESIS; ULTRASOUND LEFT BREAST LIMITED TECHNIQUE: Left digital diagnostic mammography and breast tomosynthesis was performed.; Targeted ultrasound examination of the left breast was performed. COMPARISON:  Previous exam(s). ACR Breast Density Category c: The breasts are heterogeneously dense, which may obscure small masses. FINDINGS: The left breast asymmetry identified on the MLO view improves on additional imaging. Targeted ultrasound is performed, showing a hypoechoic masslike region at 11 o'clock, 5 cm from the nipple measuring 16 x 7 x 9 mm. No axillary adenopathy. IMPRESSION: There is a 16 mm hypoechoic irregular mass with indistinct margins which may correlate with the left breast asymmetry. RECOMMENDATION: Recommend ultrasound-guided biopsy of the left breast mass. I have discussed the findings and recommendations with the patient. If applicable, a reminder letter will be sent to the patient regarding the next appointment. BI-RADS CATEGORY  4: Suspicious. Electronically Signed   By: Gerome Sam III M.D.   On: 12/11/2022 16:25  [place] LIMITED ULTRASOUND INCLUDING AXILLA LEFT BREAST   Result Date: 12/11/2022 CLINICAL DATA:  The patient was called for a breast asymmetry EXAM: DIGITAL DIAGNOSTIC UNILATERAL LEFT MAMMOGRAM WITH TOMOSYNTHESIS; ULTRASOUND LEFT BREAST LIMITED TECHNIQUE: Left digital diagnostic mammography and breast tomosynthesis was performed.; Targeted ultrasound examination of the left breast was performed. COMPARISON:  Previous exam(s). ACR Breast Density Category c: The breasts are heterogeneously dense, which may obscure small masses. FINDINGS: The left breast asymmetry identified on the MLO view  improves on additional imaging. Targeted ultrasound is performed, showing a hypoechoic masslike region at 11 o'clock, 5 cm from the nipple measuring 16 x 7 x 9 mm. No axillary adenopathy. IMPRESSION: There is a 16 mm hypoechoic irregular mass with indistinct margins which may correlate with the left breast asymmetry. RECOMMENDATION: Recommend ultrasound-guided biopsy of the left breast mass. I have discussed the findings and recommendations with the patient. If applicable, a reminder letter will be sent to the patient regarding the next appointment. BI-RADS CATEGORY  4: Suspicious. Electronically Signed   By: Gerome Sam III M.D.   On: 12/11/2022 16:25  MM 3D SCREENING MAMMOGRAM BILATERAL BREAST  Result Date: 11/29/2022 CLINICAL DATA:  Screening. EXAM: DIGITAL SCREENING BILATERAL MAMMOGRAM WITH TOMOSYNTHESIS AND CAD TECHNIQUE: Bilateral screening digital craniocaudal and mediolateral oblique mammograms were obtained. Bilateral screening digital breast tomosynthesis was performed. The images were evaluated with computer-aided detection. COMPARISON:  Previous exam(s). ACR Breast Density Category c: The breasts are heterogeneously dense, which may obscure small masses. FINDINGS: In the left breast, a possible asymmetry warrants further evaluation. In the right breast, no findings suspicious for malignancy. IMPRESSION: Further evaluation is suggested for possible asymmetry in the left breast. RECOMMENDATION: Diagnostic mammogram and possibly ultrasound of the left breast. (Code:[id number]) The patient will be contacted regarding the findings, and additional imaging will be scheduled. BI-RADS CATEGORY  0: Incomplete: Need additional imaging evaluation. Electronically Signed   By: Sharlyne Pacas  Dimitrova M.D.   On: 11/29/2022 16:28     Assessment & Plan:   Mild sleep apnea - HST on 07/02/2022 >> mild obstructive sleep apnea, AHI 7.1 an hour with SpO2 low 68% (average 93%). Started on auto CPAP in February 2024. She  is 97% compliance with CPAP use > 4 hours last 30 days.  She reports improvement in sleep quality with PAP therapy. Average usage 6 hours 12 mins. Pressure 5-15cm h20; Residual AHI 2.2/hour. Using nasal mask, dry mouth has improved. Not requiring chin strap. Humidity at level 5. No changes recommended today.  Advised patient continue her CPAP nightly for 4 to 6 hours.  Encouraged weight loss efforts as able.  Cautioned against alcohol/sedative use prior to bedtime as these can worsen underlying sleep apnea and avoid driving if experiencing excessive daytime sleepiness. FU in 6 months or sooner if needed.      Glenford Bayley, NP 12/26/2022

## 2022-12-26 NOTE — Assessment & Plan Note (Signed)
-   HST on 07/02/2022 >> mild obstructive sleep apnea, AHI 7.1 an hour with SpO2 low 68% (average 93%). Started on auto CPAP in February 2024. She is 97% compliance with CPAP use > 4 hours last 30 days.  She reports improvement in sleep quality with PAP therapy. Average usage 6 hours 12 mins. Pressure 5-15cm h20; Residual AHI 2.2/hour. Using nasal mask, dry mouth has improved. Not requiring chin strap. Humidity at level 5. No changes recommended today.  Advised patient continue her CPAP nightly for 4 to 6 hours.  Encouraged weight loss efforts as able.  Cautioned against alcohol/sedative use prior to bedtime as these can worsen underlying sleep apnea and avoid driving if experiencing excessive daytime sleepiness. FU in 6 months or sooner if needed.

## 2022-12-27 ENCOUNTER — Other Ambulatory Visit: Payer: Self-pay

## 2022-12-27 DIAGNOSIS — R053 Chronic cough: Secondary | ICD-10-CM

## 2022-12-27 DIAGNOSIS — R0789 Other chest pain: Secondary | ICD-10-CM

## 2022-12-27 NOTE — Telephone Encounter (Signed)
Was given by Dr. Selena Batten in the past. Ok to refill

## 2022-12-28 MED ORDER — ALBUTEROL SULFATE HFA 108 (90 BASE) MCG/ACT IN AERS: INHALATION_SPRAY | RESPIRATORY_TRACT | 2 refills | Status: DC

## 2023-01-01 ENCOUNTER — Encounter: Payer: Self-pay | Admitting: Neurology

## 2023-01-01 ENCOUNTER — Ambulatory Visit (HOSPITAL_COMMUNITY): Payer: Medicare HMO | Attending: Cardiology

## 2023-01-01 DIAGNOSIS — R072 Precordial pain: Secondary | ICD-10-CM | POA: Insufficient documentation

## 2023-01-01 DIAGNOSIS — R0602 Shortness of breath: Secondary | ICD-10-CM | POA: Diagnosis not present

## 2023-01-01 DIAGNOSIS — R002 Palpitations: Secondary | ICD-10-CM | POA: Diagnosis not present

## 2023-01-01 DIAGNOSIS — R079 Chest pain, unspecified: Secondary | ICD-10-CM | POA: Insufficient documentation

## 2023-01-01 LAB — ECHOCARDIOGRAM COMPLETE
Area-P 1/2: 2.26 cm2
S' Lateral: 2.7 cm

## 2023-01-02 ENCOUNTER — Ambulatory Visit (INDEPENDENT_AMBULATORY_CARE_PROVIDER_SITE_OTHER): Payer: Medicare HMO | Admitting: Family

## 2023-01-02 ENCOUNTER — Encounter: Payer: Self-pay | Admitting: Family

## 2023-01-02 ENCOUNTER — Other Ambulatory Visit: Payer: Self-pay | Admitting: Family

## 2023-01-02 DIAGNOSIS — N644 Mastodynia: Secondary | ICD-10-CM | POA: Insufficient documentation

## 2023-01-02 DIAGNOSIS — N6489 Other specified disorders of breast: Secondary | ICD-10-CM

## 2023-01-02 NOTE — Assessment & Plan Note (Signed)
Suspected hematoma, likely not concerning and monitor for resolution However pt worry for dvt/infection  Low suspicion however will order u/s breast Pending results.  If any sudden sob or cp seek immediate attention  Please monitor site for worsening signs/symptoms of infection to include: increasing redness, increasing tenderness, increase in size, and or pustulant drainage from site. If this is to occur please let me know immediately.

## 2023-01-02 NOTE — Progress Notes (Signed)
Established Patient Office Visit  Subjective:   Patient ID: Sharon [name], female    DOB: 1965-08-27  Age: 57 y.o. MRN: [id number]  CC: No chief complaint on file.   HPI: Sharon [name] is a 57 y.o. female presenting on 01/02/2023 for No chief complaint on file.  Pt states had a breast biopsy which came back benign however she does state during the biopsy was bleeding down her breast and had developed a bruise. She states the provider performing the biopsy said that this was likely going to be a hematoma and would resolve on its own.   Pt is concerned because it feels hard and painful at times. Denies discharge. No redness.         ROS: Negative unless specifically indicated above in HPI.   Relevant past medical history reviewed and updated as indicated.   Allergies and medications reviewed and updated.   Current Outpatient Medications:    Acetylcysteine (NAC) 600 MG CAPS, Take 600 mg by mouth 2 (two) times daily., Disp: , Rfl:    acyclovir (ZOVIRAX) 400 MG tablet, Take 1 tablet by mouth daily., Disp: , Rfl:    albuterol (VENTOLIN HFA) 108 (90 Base) MCG/ACT inhaler, INHALE 1-2 PUFFS BY MOUTH EVERY 6 HOURS AS NEEDED FOR WHEEZE OR SHORTNESS OF BREATH, Disp: 8.5 each, Rfl: 2   bimatoprost (LUMIGAN) 0.03 % ophthalmic solution, Place 1 drop into both eyes at bedtime., Disp: , Rfl:    Cholecalciferol (VITAMIN D) 50 MCG (2000 UT) tablet, 1 tablet, Disp: , Rfl:    clobetasol (TEMOVATE) 0.05 % external solution, APPLY TO AFFECTED AREA TWICE A DAY, Disp: 50 mL, Rfl: 0   EPINEPHrine 0.3 mg/0.3 mL IJ SOAJ injection, Inject 0.3 mg into the muscle as needed for anaphylaxis., Disp: 1 each, Rfl: 0   estradiol (ESTRACE) 0.1 MG/GM vaginal cream, Insert pea-sized amount nightly for 2 weeks then every other night, Disp: , Rfl:    famotidine (PEPCID) 40 MG tablet, Take 1 tablet (40 mg total) by mouth daily., Disp: 90 tablet, Rfl: 1   famotidine (PEPCID) 40 MG tablet, Take 1 tablet by  mouth daily., Disp: , Rfl:    gabapentin (NEURONTIN) 100 MG capsule, Take 2 capsules (200 mg total) by mouth at bedtime., Disp: 180 capsule, Rfl: 1   gabapentin (NEURONTIN) 600 MG tablet, Take 1 tablet (600 mg total) by mouth 3 (three) times daily., Disp: 90 tablet, Rfl: 3   lidocaine (LIDODERM) 5 %, Apply 1 patch topically daily., Disp: , Rfl:    LUMIGAN 0.01 % SOLN, 1 drop at bedtime., Disp: , Rfl:    meloxicam (MOBIC) 15 MG tablet, Take 1 tablet (15 mg total) by mouth daily., Disp: 30 tablet, Rfl: 2   methocarbamol (ROBAXIN) 500 MG tablet, Take 1 tablet (500 mg total) by mouth every 8 (eight) hours as needed for muscle spasms., Disp: 90 tablet, Rfl: 1   mometasone (NASONEX) 50 MCG/ACT nasal spray, Place 2 sprays into the nose daily., Disp: 1 each, Rfl: 12   nebivolol (BYSTOLIC) 5 MG tablet, TAKE 1 TABLET BY MOUTH EVERY DAY, Disp: 90 tablet, Rfl: 3   nitroGLYCERIN (NITROSTAT) 0.4 MG SL tablet, Place 1 tablet (0.4 mg total) under the tongue every 5 (five) minutes as needed for chest pain., Disp: 30 tablet, Rfl: 12   Nitroglycerin (RECTIV) 0.4 % OINT, Place 0.4 inches rectally 2 (two) times daily as needed (For anal fissures.)., Disp: 30 g, Rfl: 1   ondansetron (ZOFRAN-ODT) 4 MG disintegrating tablet,  1 tablet on the tongue and allow to dissolve Orally Once a day for 30 day(s), Disp: 20 tablet, Rfl: 0   pantoprazole (PROTONIX) 20 MG tablet, Take 1 tablet (20 mg total) by mouth daily., Disp: 90 tablet, Rfl: 3   polyethylene glycol powder (GLYCOLAX/MIRALAX) 17 GM/SCOOP powder, See admin instructions., Disp: , Rfl:    primidone (MYSOLINE) 50 MG tablet, Take 1 tablet in morning and 3 tablets at bedtime., Disp: 120 tablet, Rfl: 5   traZODone (DESYREL) 50 MG tablet, Take 1 tablet (50 mg total) by mouth at bedtime as needed for sleep., Disp: 90 tablet, Rfl: 3   vitamin B-12 (CYANOCOBALAMIN) 1000 MCG tablet, Take 1 tablet (1,000 mcg total) by mouth daily., Disp: 30 tablet, Rfl: 3   Zinc 50 MG TABS, 1 tablet  Orally Once a day for 30 day(s), Disp: , Rfl:   Allergies  Allergen Reactions   Bactrim [Sulfamethoxazole-Trimethoprim] Shortness Of Breath   Bee Venom Anaphylaxis and Other (See Comments)   Penicillins Other (See Comments) and Swelling    Reaction unknown from childhood Has patient had a PCN reaction causing immediate rash, facial/tongue/throat swelling, SOB or lightheadedness with hypotension: unknown Has patient had a PCN reaction causing severe rash involving mucus membranes or skin necrosis: unknown  Has patient had a PCN reaction that required hospitalization: no Has patient had a PCN reaction occurring within the last 10 years: no If all of the above answers are "NO", then may proceed with Cephalosporin use.  Other reaction(s): Other, Unknown Throat swells Throat swells Reaction unknown from childhood Has patient had a PCN reaction causing immediate rash, facial/tongue/throat swelling, SOB or lightheadedness with hypotension: unknown Has patient had a PCN reaction causing severe rash involving mucus membranes or skin necrosis: unknown  Has patient had a PCN reaction that required hospitalization: no Has patient had a PCN reaction occurring within the last 10 years: no If all of the above answers are "NO", then may proceed with Cephalosporin use.  Other reaction(s): Not available   Dextromethorphan     Other reaction(s): Lump in throat Other reaction(s): Lump in throat   Elavil [Amitriptyline] Other (See Comments)    Dry mouth   Hydrocodone-Acetaminophen Other (See Comments)   Oxycodone Other (See Comments)   Penicillin G     Other reaction(s): Unknown   Dextromethorphan Polistirex Er Other (See Comments)    Pt states caused "a lump" in her throat, making it difficult to swallow   Fluconazole     Primidone interaction Other reaction(s): Primidone interaction Other reaction(s): Primidone interaction   Hydrocodone Nausea And Vomiting and Nausea Only    Other reaction(s):  nausea and vomiting Other reaction(s): nausea and vomiting   Oxycodone-Acetaminophen Other (See Comments)    Made fingers tingle and go numb, started "feeling weird".  Other reaction(s): Other Made fingers tingle and go numb, started "feeling weird".  Other reaction(s): tingle/numb fingers Other reaction(s): tingle/numb fingers   Tape Itching and Other (See Comments)    Bandaids - leaves red marks  Other reaction(s): itching    Objective:   LMP 10/11/2010    Physical Exam Constitutional:      General: She is not in acute distress.    Appearance: Normal appearance. She is normal weight. She is not ill-appearing, toxic-appearing or diaphoretic.  HENT:     Head: Normocephalic.  Cardiovascular:     Rate and Rhythm: Normal rate.  Pulmonary:     Effort: Pulmonary effort is normal.  Chest:  Breasts:  Left: Tenderness present. No bleeding or nipple discharge.     Comments: Ecchymosis with hematoma on left upper to outer breast tenderness on palpation.  Not warm to touch, no erythema.  Musculoskeletal:        General: Normal range of motion.  Neurological:     General: No focal deficit present.     Mental Status: She is alert and oriented to person, place, and time. Mental status is at baseline.  Psychiatric:        Mood and Affect: Mood normal.        Behavior: Behavior normal.        Thought Content: Thought content normal.        Judgment: Judgment normal.     Assessment & Plan:  Breast pain, left Assessment & Plan: Suspected hematoma, likely not concerning and monitor for resolution However pt worry for dvt/infection  Low suspicion however will order u/s breast Pending results.  If any sudden sob or cp seek immediate attention  Please monitor site for worsening signs/symptoms of infection to include: increasing redness, increasing tenderness, increase in size, and or pustulant drainage from site. If this is to occur please let me know immediately.         Follow up plan: Return if symptoms worsen or fail to improve.  Mort Sawyers, FNP

## 2023-01-02 NOTE — Progress Notes (Signed)
 Noted  

## 2023-01-06 ENCOUNTER — Other Ambulatory Visit: Payer: Self-pay | Admitting: Family

## 2023-01-06 DIAGNOSIS — F411 Generalized anxiety disorder: Secondary | ICD-10-CM

## 2023-01-13 DIAGNOSIS — G4733 Obstructive sleep apnea (adult) (pediatric): Secondary | ICD-10-CM | POA: Diagnosis not present

## 2023-01-18 DIAGNOSIS — R079 Chest pain, unspecified: Secondary | ICD-10-CM

## 2023-01-18 DIAGNOSIS — R002 Palpitations: Secondary | ICD-10-CM | POA: Diagnosis not present

## 2023-01-18 DIAGNOSIS — R0602 Shortness of breath: Secondary | ICD-10-CM | POA: Diagnosis not present

## 2023-01-18 DIAGNOSIS — R072 Precordial pain: Secondary | ICD-10-CM | POA: Diagnosis not present

## 2023-01-29 ENCOUNTER — Encounter: Payer: Self-pay | Admitting: Physical Medicine and Rehabilitation

## 2023-01-29 ENCOUNTER — Encounter: Payer: Medicare HMO | Attending: Physical Medicine and Rehabilitation | Admitting: Physical Medicine and Rehabilitation

## 2023-01-29 VITALS — BP 137/83 | HR 61 | Ht 67.0 in | Wt 161.0 lb

## 2023-01-29 DIAGNOSIS — R7401 Elevation of levels of liver transaminase levels: Secondary | ICD-10-CM | POA: Diagnosis not present

## 2023-01-29 DIAGNOSIS — M25561 Pain in right knee: Secondary | ICD-10-CM | POA: Insufficient documentation

## 2023-01-29 DIAGNOSIS — M25562 Pain in left knee: Secondary | ICD-10-CM | POA: Diagnosis not present

## 2023-01-29 DIAGNOSIS — M21619 Bunion of unspecified foot: Secondary | ICD-10-CM | POA: Insufficient documentation

## 2023-01-29 DIAGNOSIS — R7303 Prediabetes: Secondary | ICD-10-CM | POA: Diagnosis not present

## 2023-01-29 DIAGNOSIS — G8929 Other chronic pain: Secondary | ICD-10-CM | POA: Insufficient documentation

## 2023-01-29 DIAGNOSIS — R413 Other amnesia: Secondary | ICD-10-CM | POA: Diagnosis not present

## 2023-01-29 DIAGNOSIS — F411 Generalized anxiety disorder: Secondary | ICD-10-CM | POA: Diagnosis not present

## 2023-01-29 DIAGNOSIS — R799 Abnormal finding of blood chemistry, unspecified: Secondary | ICD-10-CM | POA: Diagnosis not present

## 2023-01-29 DIAGNOSIS — M6289 Other specified disorders of muscle: Secondary | ICD-10-CM | POA: Diagnosis not present

## 2023-01-29 MED ORDER — METHOCARBAMOL 500 MG PO TABS: 500.0000 mg | ORAL_TABLET | Freq: Three times a day (TID) | ORAL | 1 refills | Status: DC | PRN

## 2023-01-29 MED ORDER — GABAPENTIN 600 MG PO TABS: 600.0000 mg | ORAL_TABLET | Freq: Three times a day (TID) | ORAL | 3 refills | Status: DC

## 2023-01-29 MED ORDER — MELOXICAM 15 MG PO TABS: 15.0000 mg | ORAL_TABLET | Freq: Every day | ORAL | 2 refills | Status: DC

## 2023-01-29 MED ORDER — BUSPIRONE HCL 5 MG PO TABS: 5.0000 mg | ORAL_TABLET | Freq: Three times a day (TID) | ORAL | 3 refills | Status: DC

## 2023-01-29 MED ORDER — GABAPENTIN 100 MG PO CAPS: 200.0000 mg | ORAL_CAPSULE | Freq: Every day | ORAL | 1 refills | Status: DC

## 2023-01-29 MED ORDER — TRAZODONE HCL 50 MG PO TABS: 50.0000 mg | ORAL_TABLET | Freq: Every evening | ORAL | 3 refills | Status: DC | PRN

## 2023-01-29 NOTE — Progress Notes (Signed)
Subjective:    Patient ID: [name], female    DOB: Nov 21, 1965, 57 y.o.   MRN: [id number]  HPI   Mrs. Montet is a 57 year old woman who presents for f/u insomnia, bilateral knee pain following left knee replacement in 04/2019, transaminitis, shortness of breath, brain fog, and prediabetes.   1) Bilateral knee pain L>R: -right knee pain is getting worse -getting a new bike and she is hoping that strengthening her muscles will help -went to church a couple times but it hurt so badly. -right knee pain has been getting worse -Her ROM is still restricted in her left knee.  -ice helps -radiates between hip and left knee Feels like a charley horse -She asks whether the scar tissue will ever heal.  -Cannot sleep without a pillow underneath her legs.  -She has follow-up with Dr. Turner Daniels and Dr. Magnus Ivan -Currently is not planning to pursue surgery.  -She has been off hydrocodone.  -She would be interested in increasing the Gabapentin dose.  -She would like to discuss an option for inflammation. She has tried aspirations but the fluid comes back right away.  -She got a  Steroid injection and experienced worsening range of motion in her left knee- that was from Dr. Turner Daniels. He also pulled fluid off. It does help with pain, but she does not want another given her worsening range of motion afterward. She does have improved sensation in her knee after the injection! -has been hurting -discussed Qutenza as a treatment option -her current regimen is working for her.  -has been having digestive issues and stopped meloxicam.  -pain has been severe lately -she has decreased her robaxin to daily but this has not improved her dry mouth  2) Anxiety: This has much improved with the Gabapentin. 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  -she has used blue emu oil and diclofenac gel without great benefit -has appointment with physician today.  -she does use her hands a lot.  -she has been dropping things recently.   9) lower back pain -has gotten worse -got Xrs and was shown to have cyst on right side and bulging disc on left side and pinched nerve in the middle.  -she feels a constant burning -worst on left side -feels she is walking crooked -she had an injection by Dr. Modesto Charon at orthopedics but these did not help.  -she know has a bone further up that is bothering her.  -left pain in her boy friend's car so had a hard time getting in today -hurts severely sometimes -pinches when she walks -sitting also hurts -she does not have insurance right now -ice helps -she uses heat for the muscular pain -hurt it while bending and turning -has been worse since she carried her grandchild -she is unable to vacuum.  -discussed therapy and using lidocaine patch, massage.  -she is ok using gabapentin and her muscle relaxers.  -found lidocaine patch to be a lifesaver while she was at the beach.  -ready to try Qutenza today -has been severe recently -plans to follow-up with her orthopedic surgeon.  10) Constipation -has been needing to take laxatives.   11) shortness of breath -worried abut blot clots -she asks about results of her vascular ultrasound of her lower extremity and what treatment is necessary, how she would know if there are clots elsewhere in her body, and how she may have developed these clots.  -she has being set up for tests  12) impaired memory  13) Prediabetes -she asks about HgbA1c test result  14) Transaminitis -she asks whether any of her medications could have caused  this'  15) Food allergy -she asks about her food allergy results  16) Insomnia: -asks if there is something else she can try for her insomnia -she has been trying to wean off her Klonopin but this makes it harder for her to fall asleep -the Requip caused dry mouth and she is hesitant about its potential side effects -she has not tried tart cherry juice or melatonin  17) Chest pain -has been radiating to left arm -she follows with a cardiologist Prior history:  Since last visit she has stopped Norco as it caused respiratory distress. We tried Cymbalta 40mg  without benefit or side effects. She has tried voltaren gel, blue emu oil without benefit. She has tried CBD.   She has been trying to improve quadriceps musculature. She discusses some of the exercises she does.   She has been denied in her disability. She has talked to an attorney.  She continues to ambulate with a cane.   She has tried the compounding cream and that does not help much. She has been trying Hemp cream which is helping.  She is getting foot insoles from the podiatrist. They should be ready in a week or two.   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   Can ride the stationary bicycle and that does not hurt. She sues it every day. She uses leg weights. When   Scar tissue grew back quickly post-surgery. She could not get PT appointments for 3 weeks.   Her surgeon does not want her to break the hardware and he may have to do arthroscopic procedure to flush out the inflammation.   She has tried ice which stopped helping, nabumetone 750 mg BID, Norco 1-2 tablets three times per day PRN (she has 12 left). She uses diclofenac gel   She takes Miralax for constipation. She takes up to 2 times per day. She has a history of IBS.   [facility] in [place]- she was a Scientist, physiological. They let her go because she did not  return in 12 weeks.   She was doing pool therapy but chemicals in the pool were increased because of COVID.  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 Notes reviewed- recently had a right subacromial injection.   Current pain is 8/10.   12) Hip pain -pain is best in the water  13) Stress: -it was stressful for her to help plan her daughter's baby shower  14) Anxiety: -her boy friend is talking about leaving again -she has been having a lot of anxiety about this   Pain Inventory Average Pain 8 Pain Right Now 8 My pain is constant, burning, dull, stabbing, aching, and cramps  In the last 24 hours, has pain interfered with the following? General activity 8 Relation with others 10 Enjoyment of life 9 What TIME of day is your  pain at its worst? evening, daytime Sleep (in general) Fair  Pain is worse with: walking, bending, sitting, inactivity, and standing, some activities Pain improves with: rest, heat/ice, and medication Relief from Meds: 6     Family History  Problem Relation Age of Onset   Emphysema Mother        smoker   Allergies Mother    Asthma Mother    Heart disease Mother        AMI as cause of death   COPD Mother    Asthma Father    Heart disease Father        AMI as cause of death   Diabetes Father    Asthma Brother    Heart disease Brother 60       heart failure   Lung cancer Maternal Grandfather        was a smoker   Cancer Maternal Grandfather        lung   Heart disease Paternal Grandmother        AMI   Heart disease Paternal Grandfather        AMI   Colon cancer Neg Hx    Social History   Socioeconomic History   Marital status: Divorced    Spouse name: Not on file   Number of children: 1   Years of education: Not on file   Highest education level: Not on file  Occupational History   Occupation: IT sales professional: [employer]  Tobacco Use   Smoking status: Never   Smokeless tobacco: Never   Vaping Use   Vaping status: Never Used  Substance and Sexual Activity   Alcohol use: Not Currently    Comment: occ   Drug use: No    Comment: former maryjuana use- quit in 1995   Sexual activity: Yes    Partners: Male    Birth control/protection: Surgical  Other Topics Concern   Not on file  Social History Narrative   Marital status: divorced x 2; dating seriously x 3 years.        Children:  1 daughter (87); no grandchildren      Lives:  With boyfriend.        Employment: works at [employer]      Tobacco: none      Alcohol: rarely; socially      Exercise: none; has treadmill and elliptical and bikes   Coffee in am / 1/2 of coke in afternoon    High school education      01/29/20   From: the area   Living: alone - but boyfriend living with her Genelle Bal (2020) but knew each other in high school   Work: on disability - working on appeal      Family: one daughter Leonette Nutting - one grandchild coming in January       Enjoys: relax and watch tv, use to like fishing/boating/rafting, reading, crochet       Exercise: rehab exercises   Diet: not great - limited intake      Safety   Seat belts: Yes    Guns: Yes  and secure   Right handed   One story home   Drinks caffeine   Social Determinants of Health   Financial Resource Strain: Not on file  Food Insecurity: Not on file  Transportation Needs: Not on file  Physical Activity: Not on file  Stress: Not on file  Social Connections: Not on file  Past Surgical History:  Procedure Laterality Date   ABDOMINAL HYSTERECTOMY     still with both ovaries   BREAST BIOPSY Left 12/19/2022   [place] LT BREAST BX W LOC DEV 1ST LESION IMG BX SPEC US GUIDE 12/19/2022 [facility]   CATARACT EXTRACTION Bilateral 2015   COLONOSCOPY     CYSTOSCOPY W/ URETERAL STENT PLACEMENT Right 05/23/2017   Procedure: CYSTOSCOPY WITH RETROGRADE PYELOGRAM/URETERAL RIGHT STENT PLACEMENT;  Surgeon: Alfredo Martinez, MD;  Location: [facility];  Service: Urology;   Laterality: Right;   CYSTOSCOPY W/ URETERAL STENT PLACEMENT Right 06/04/2017   Procedure: CYSTOSCOPY WITHRIGHT RETROGRADE PYELOGRAMRIGHT /URETEREOSCOPY  STENT PLACEMENT;  Surgeon: Crista Elliot, MD;  [facility];  Service: Urology;  Laterality: Right;   ENDOMETRIAL ABLATION     ESOPHAGOGASTRODUODENOSCOPY (EGD) WITH PROPOFOL N/A 08/16/2022   Procedure: ESOPHAGOGASTRODUODENOSCOPY (EGD) WITH PROPOFOL;  Surgeon: Toney Reil, MD;  Location: [facility] ENDOSCOPY;  Service: Gastroenterology;  Laterality: N/A;   EYE SURGERY     HEMORRHOID SURGERY  2011   INCONTINENCE SURGERY     INNER EAR SURGERY     X 2   RECTAL PROLAPSE REPAIR     RETINAL DETACHMENT SURGERY Bilateral 2000   TOTAL KNEE ARTHROPLASTY Left 04/29/2019   Procedure: LEFT TOTAL KNEE ARTHROPLASTY;  Surgeon: Kathryne Hitch, MD;  Location: [facility];  Service: Orthopedics;  Laterality: Left;   TUBAL LIGATION     VEIN SURGERY Left 04/2010   Past Medical History:  Diagnosis Date   Allergy    Anal fissure    Anemia    borderline   Anxiety    Arthritis    Blood clot in vein    left leg   Detached retina    BOTH SCLERA BUCKLE BOTH EYES   DVT (deep venous thrombosis) (HCC)    left leg   Endometriosis    Family history of adverse reaction to anesthesia    DAUGHTER POST OP PONV   GERD (gastroesophageal reflux disease)    Glaucoma    RIGHT WORST THAN LEFT   Heart murmur    "caused by anxiety"    History of hemorrhoids    History of kidney stones    Hypertension    IBS (irritable bowel syndrome)    Perforated eardrum, right    SMALL HOLE   PONV (postoperative nausea and vomiting)    Pulmonary embolism (HCC) 6 YRS AGO   Status post arthroscopy of left knee 08/06/2017   BP 137/83   Pulse 61   Ht 5\' 7"  (1.702 m)   Wt 161 lb (73 kg)   LMP 10/11/2010   SpO2 97%   BMI 25.22 kg/m   Opioid Risk Score:   Fall Risk Score:  `1  Depression screen PHQ 2/9     01/29/2023    2:27 PM 10/24/2022     2:28 PM 10/11/2022   10:15 AM 08/25/2022   10:50 AM 07/11/2022   12:44 PM 03/28/2022   10:38 AM 10/12/2021   11:35 AM  Depression screen PHQ 2/9  Decreased Interest 0  2 2 1 1 3   Down, Depressed, Hopeless 0  1 1 1 1 1   PHQ - 2 Score 0  3 3 2 2 4   Altered sleeping  1 0 3   1  Tired, decreased energy  1 3 3   3   Change in appetite   2 0   0  Feeling bad or failure about yourself    0  0   0  Trouble concentrating   0 0   0  Moving slowly or fidgety/restless   0 0   0  Suicidal thoughts   0 0   0  PHQ-9 Score   8 9   8   Difficult doing work/chores   Very difficult Extremely dIfficult   Somewhat difficult   Review of Systems  Constitutional: Negative.        Weight loss  HENT: Negative.   Eyes: Negative.   Respiratory: Positive for shortness of breath.   Cardiovascular: Negative.   Gastrointestinal: Negative.   Endocrine: Negative.   Genitourinary: Negative.   Musculoskeletal: Positive for arthralgias, back pain and gait problem.       Spasms  Skin: Negative.   Allergic/Immunologic: Negative.   Neurological: Positive for tremors and numbness.  Hematological: Negative.   Psychiatric/Behavioral:       Anxiety  All other systems reviewed and are negative.      Objective:  Gen: no distress, normal appearing, BMI 25.22, weight 159 lbs, BP 137/83 HEENT: oral mucosa pink and moist, NCAT Cardio: Reg rate Chest: normal effort, normal rate of breathing Abd: soft, non-distended Ext: no edema Psych: pleasant, normal affect Skin: intact Neuro: Alert and oriented x3 MSK: ambulates with cane  Assessment & Plan:  Mrs. Danis is a 57 year old woman who presents for follow-up:   1) Left knee pain post-surgery: -discussed that knee pain has worsened since she has been weaning off her medications to avoid their potential side effects -She has a plan for repeat surgical procedure with Dr. Magnus Ivan that will hopefully provide relief.  -She has been icing regularly- recommended three  tines per day for 15 minutes. Discussed that this decreases blood flow and can impair healing but help with swelling and pain relief.  -discussed the limitations of her pain on her function -discussed extracorporeal shockwave therapy and how this can help break apart scar tissue -unable to work due the level of her pain -discussed current impact on her life -continue cane for balance -She has been through physical therapy and performs her exercises diligently. --Discussed Sprint PNS system as an option of pain treatment via neuromodulation. Provided following link for patient to learn more about the system: https://www.sprtherapeutics.com/. She does not like the feeling of things under her skin and defers that option.  -She weaned herself all the steroids.  -Provided with a pain relief journal and discussed that it contains foods and lifestyle tips to naturally help to improve pain. Discussed that these lifestyle strategies are also very good for health unlike some medications which can have negative side effects. Discussed that the act of keeping a journal can be therapeutic and helpful to realize patterns what helps to trigger and alleviate pain.   -discussed mechanism of action of low dose naltrexone as an opioid receptor antagonist which stimulates your body's production of its own natural endogenous opioids, helping to decrease pain. Discussed that it can also decrease T cell response and thus be helpful in decreasing inflammation, and symptoms of brain fog, fatigue, anxiety, depression, and allergies. Discussed that this medication needs to be compounded at a compounding pharmacy and can more expensive. Discussed that I usually start at 1mg  and if this is not providing enough relief then I titrate upward on a monthly basis.   -Discussed Qutenza as an option for neuropathic pain control. Discussed that this is a capsaicin patch, stronger than capsaicin cream. Discussed that it is currently approved  for  diabetic peripheral neuropathy and post-herpetic neuralgia, but that it has also shown benefit in treating other forms of neuropathy. Provided patient with link to site to learn more about the patch: https://www.clark.biz/. Discussed that the patch would be placed in office and benefits usually last 3 months. Discussed that unintended exposure to capsaicin can cause severe irritation of eyes, mucous membranes, respiratory tract, and skin, but that Qutenza is a local treatment and does not have the systemic side effects of other nerve medications. Discussed that there may be pain, itching, erythema, and decreased sensory function associated with the application of Qutenza. Side effects usually subside within 1 week. A cold pack of analgesic medications can help with these side effects. Blood pressure can also be increased due to pain associated with administration of the patch.  -continue ice     -continue Robaxin as she is nervous about feeling too sleepy with the Tizanidine, and the Robaxin does help.   Continue gabapentin to 600mg  TID PRN.   -Continue neoprene knee sleeve for proprioception  -She has tried Celebrex and Tramadol without benefit. She had respiratory distress with hydrocodone.   -She had worse pain with steroid injection in her left knee.   -Discussed steroids but she did not like the side effect profile. Discussed the different steroid options. She weaned off these because she developed mouth sores.   -Continue turmeric and cherries.   -Continue Lidocaine patches today.   -She would like rheum panel checked: ordered, reviewed results which were negative, and discussed with patient.  -Refilled meloxicam.   2) Bunion:  -continue orthotics  3) Irritable bowel syndrome: -She does not eat much sugar or sweets.  -She drinks 1 coke per day.  -She cuts tea out.  -She drinks water, coffee.  -She has been going more regularly and has not needed her Miralax.  -food allergy  testing ordered  4) Impaired Concentration: - This persists -She forgets things easily.  -She tries not to multitask when she was with her grandmother.   5) Anxiety: The Gabapentin is helping with this as well, continue -discussed plan to start ecitalopram and that she has cleared this with her ophthalmologist -discussed increasing trazodone -Buspar prescribed  6) Vulvodynia: -discussed various treatment options: biofeedback, medications, continue estradiol.  -continue Amitriptyline 10mg  HS, discussed moving dose earlier in the evening.   7) Cervical and lumbar myofascial pain: -try massage -discussed benefits of therapy and trigger point injections -apply lidocaine patch.   8) Bilateral hand pain: likely secondary to arthritis compounded by carpal tunnel syndrome -continue Gabapentin which is helping -continue f/u with surgery as planned.   9) Low back pain, appears to be secondary to face arthritis -discussed XR, but she defers due to potential cost -discussed surgery with an orthopedic and she would like to consider this but her insurance would not cover it.  -discussed lack of improvement with injections from Dr. Modesto Charon.  -continue lidocaine patches, discussed that these make her feel weird when she takes them off.  - Ketamine 10%, Baclofen 2%, Cyclobenzaprine 2%, Ketoprofen 10%, Gabapentin 6%, Bupivacaine 1%, Amitryptiline 5%, clonidine 0.2%  Dispense 240 g  Apply 1-2 g to affected area 3-4 times per day  -continue postural correction -Provided with a pain relief journal and discussed that it contains foods and lifestyle tips to naturally help to improve pain. Discussed that these lifestyle strategies are also very good for health unlike some medications which can have negative side effects. Discussed that the act of keeping a journal can  be therapeutic and helpful to realize patterns what helps to trigger and alleviate pain.   -Discussed following foods that may reduce  pain: 1) Ginger (especially studied for arthritis)- reduce leukotriene production to decrease inflammation 2) Blueberries- high in phytonutrients that decrease inflammation 3) Salmon- marine omega-3s reduce joint swelling and pain 4) Pumpkin seeds- reduce inflammation 5) dark chocolate- reduces inflammation 6) turmeric- reduces inflammation 7) tart cherries - reduce pain and stiffness 8) extra virgin olive oil - its compound olecanthal helps to block prostaglandins  9) chili peppers- can be eaten or applied topically via capsaicin 10) mint- helpful for headache, muscle aches, joint pain, and itching 11) garlic- reduces inflammation  Link to further information on diet for chronic pain: http://www.bray.com/   10) shoulder pain -used to get shots in the pt, but laying on her back has helped to decrease her shoulder pain  11) Acid reflux -continues famotidine.   11) Soreness in legs -vascular ultrasound ordered of both legs -discussed her history of clots -discussed that a D-dimer test can suggest clot if elevated.   12) Irritable bowel syndrome -discussed her current diet -discussed that she does not eat breakfast due to nausea -discussed her diarrhea and constipation, more diarrheal type at this time.  -discussed her aunt's advise to have a tsp coconut oil daily.  -recommended bioptemizer's magnesium breakthrough -reviewed food allergies with her and made goal for 4 week elimination of foods with the highest responses  13) Insomnia/Shortness of breath with history of clots -discussed that there is no evidence of PE on her CTA -discussed her follow-up with PCP -discussed that her vascular ultrasound of her lower extremities shows a chronic left peroneal CVT  -recommended starting nattokinase to help thin the blood and lower her risk for clots, or to eat natto -discussed the importance of movement in clot  prevention -referred for sleep study for shortness of breath  14) Impaired memory: -discussed her memory difficulties. -discussed that memory is improving -discussed that some studies have linked chronic PPI use to memory impairment -discussed safe way to wean off these medications slowly  15) Hemorroids: Recommended Epsom salt baths  16) prediabetes - recommended avoiding processed foods/added sugars/bread/pasta/rice -recommended drinking a glass of water with either apple cide vinegar or lemon -recommended reading Wheat Belly and Grain Brain -recommended eating fruits, vegetables, yogurt, nuts, olive oil -discussed risks and benefits of metformin -discussed that repeat labs show normalization of HgbA1c! -discussed that she does not eat much anymore -encouraged getting enough protein -HgbA1c today  17) Transaminitis -recommended avoiding ultraprocessed foods -recommended avoiding/minimizing tylenol -recommended supplement NAC 600mg  BID  -recommended drinking dandelion and milk thistle teas -commended on stopping drinking soda products! -recommended avoiding processed creamer in coffee, continue drinking coffee -discussed that repeat CMP shows normalization of liver enzymes! 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 She has stopped taking this in case and dry mouth has continued -discussed that robaxin can also cause dry mouth -discussed taking muscle relaxer earlier in the evening so she does not have to wake at night to drink water  33) Chest pain: -discussed current symptoms -prescribed nitroglycerin  >40 minutes spent in discussion of her history of blood clots, insomnia and that symptoms improved with trazodone, checking her magnesium level given her cramps, right knee pain, seeing her granddaughter improves her mood, discussed that her boy friend no longer helps her with her bills or yeard work, discussed that she got her disability, discussed checking her HgbA1c and liver enzymes, discussed that benzodiazepines increase the risk of dementia, discussed that her memory has improved, refilled robaxin/methocarbamol.gabapentin

## 2023-01-29 NOTE — Addendum Note (Signed)
Addended by: Horton Chin on: 01/29/2023 03:00 PM   Modules accepted: Orders

## 2023-02-01 ENCOUNTER — Encounter (HOSPITAL_BASED_OUTPATIENT_CLINIC_OR_DEPARTMENT_OTHER): Payer: Medicare HMO | Admitting: Physical Medicine and Rehabilitation

## 2023-02-01 DIAGNOSIS — R413 Other amnesia: Secondary | ICD-10-CM | POA: Diagnosis not present

## 2023-02-01 DIAGNOSIS — R7303 Prediabetes: Secondary | ICD-10-CM

## 2023-02-01 DIAGNOSIS — M25561 Pain in right knee: Secondary | ICD-10-CM | POA: Diagnosis not present

## 2023-02-01 DIAGNOSIS — G8929 Other chronic pain: Secondary | ICD-10-CM | POA: Diagnosis not present

## 2023-02-01 DIAGNOSIS — R799 Abnormal finding of blood chemistry, unspecified: Secondary | ICD-10-CM | POA: Diagnosis not present

## 2023-02-01 DIAGNOSIS — M25562 Pain in left knee: Secondary | ICD-10-CM | POA: Diagnosis not present

## 2023-02-01 NOTE — Progress Notes (Signed)
Subjective:    Patient ID: [name], female    DOB: Mar 12, 1966, 57 y.o.   MRN: [id number]  HPI   An audio/video tele-health visit is felt to be the most appropriate encounter for this patient at this time. This is a follow up tele-visit via phone. The patient is at home. MD is at office. Prior to scheduling this appointment, our staff discussed the limitations of evaluation and management by telemedicine and the availability of in-person appointments. The patient expressed understanding and agreed to proceed.   Mrs. Voeks is a 57 year old woman who presents for f/u insomnia, bilateral knee pain following left knee replacement in 04/2019, transaminitis, shortness of breath, brain fog, and prediabetes.   1) Bilateral knee pain L>R: -right knee pain is getting worse -pain was worse after changing her bed sheets -getting a new bike and she is hoping that strengthening her muscles will help -went to church a couple times but it hurt so badly. -right knee pain has been getting worse -Her ROM is still restricted in her left knee.  -ice helps -radiates between hip and left knee Feels like a charley horse -She asks whether the scar tissue will ever heal.  -Cannot sleep without a pillow underneath her legs.  -She has follow-up with Dr. Turner Daniels and Dr. Magnus Ivan -Currently is not planning to pursue surgery.  -She has been off hydrocodone.  -She would be interested in increasing the Gabapentin dose.  -She would like to discuss an option for inflammation. She has tried aspirations but the fluid comes back right away.  -She got a  Steroid injection and experienced worsening range of motion in her left knee- that was from Dr. Turner Daniels. He also pulled fluid off. It does help with pain, but she does not want another given her worsening range of motion afterward. She does have improved sensation in her knee after the injection! -has been hurting -discussed Qutenza as a treatment option -her  current regimen is working for her.  -has been having digestive issues and stopped meloxicam.  -pain has been severe lately -she has decreased her robaxin to daily but this has not improved her dry mouth  2) Anxiety: This has much improved with the Gabapentin. 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  -she has used blue emu oil and diclofenac gel without great benefit -has appointment with physician today.  -she does use her hands a lot.  -she has been dropping things recently.   9) lower back pain -has gotten worse -got Xrs and was shown to have cyst on right side and bulging disc on left side and pinched nerve in the middle.  -she feels a constant burning -worst on left side -feels she is walking crooked -she had an injection by Dr. Modesto Charon at orthopedics but these did not help.  -she know has a bone further up that is bothering her.  -left pain in her boy friend's car so had a hard time getting in today -hurts severely sometimes -pinches when she walks -sitting also hurts -she does not have insurance right now -ice helps -she  uses heat for the muscular pain -hurt it while bending and turning -has been worse since she carried her grandchild -she is unable to vacuum.  -discussed therapy and using lidocaine patch, massage.  -she is ok using gabapentin and her muscle relaxers.  -found lidocaine patch to be a lifesaver while she was at the beach.  -ready to try Qutenza today -has been severe recently -plans to follow-up with her orthopedic surgeon.   10) Constipation -has been needing to take laxatives.    11) shortness of breath -worried abut blot clots -she asks about results of her vascular ultrasound of her lower extremity and what treatment is necessary, how she would know if there are clots elsewhere in her body, and how she may have developed these clots.  -she has being set up for tests  12) impaired memory -has improved since stopping klonopin/reduced added sugars in diet  13) Prediabetes -she asks about HgbA1c test result  14) Transaminitis -she asks whether any of her medications could have caused this'  15) Food allergy -she asks about her food allergy results  16) Insomnia: -asks if there is something else she can try for her insomnia -she has been trying to wean off her Klonopin but this makes it harder for her to fall asleep -the Requip caused dry mouth and she is hesitant about its potential side effects -she has not tried tart cherry juice or melatonin  17) Chest pain -has been radiating to left arm -she follows with a cardiologist Prior history:  Since last visit she has stopped Norco as it caused respiratory distress. We tried Cymbalta 40mg  without benefit or side effects. She has tried voltaren gel, blue emu oil without benefit. She has tried CBD.   She has been trying to improve quadriceps musculature. She discusses some of the exercises she does.   She has been denied in her disability. She has talked to an attorney.  She continues to ambulate with a cane.   She has tried the compounding cream and that does not help much. She has been trying Hemp cream which is helping.  She is getting foot insoles from the podiatrist. They should be ready in a week or two.   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   Can ride the stationary bicycle and that does not hurt. She sues it every day. She uses leg weights. When   Scar tissue grew back quickly  post-surgery. She could not get PT appointments for 3 weeks.   Her surgeon does not want her to break the hardware and he may have to do arthroscopic procedure to flush out the inflammation.   She has tried ice which stopped helping, nabumetone 750 mg BID, Norco 1-2 tablets three times per day PRN (she has 12 left). She uses diclofenac gel   She takes Miralax for constipation. She takes up to 2 times per day. She has a history of IBS.   [facility] in [place]- she was a Scientist, physiological. They let her go because she did not return in 12 weeks.   She was doing pool therapy but chemicals in the pool were increased because of COVID.  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 Notes reviewed- recently had a right subacromial injection.   Current pain is 8/10.   12) Hip pain -pain is best in the water  13) Stress: -it was stressful for her to help plan her daughter's baby shower  14) Anxiety: -her boy friend is talking about leaving again -she has been having a lot of anxiety about this   Pain Inventory Average Pain 8 Pain Right Now 8 My pain is constant, burning, dull, stabbing, aching, and cramps  In the last 24 hours, has pain interfered with the following? General activity 8 Relation with others 10 Enjoyment of life 9 What TIME of day is your pain at its worst? evening, daytime Sleep (in general) Fair  Pain is worse with: walking, bending, sitting, inactivity, and standing, some activities Pain improves with: rest, heat/ice, and medication Relief from Meds: 6     Family History  Problem Relation Age of Onset   Emphysema Mother        smoker   Allergies Mother    Asthma Mother    Heart disease Mother        AMI as cause of death   COPD Mother    Asthma Father    Heart disease Father        AMI as cause of death   Diabetes Father    Asthma Brother    Heart disease Brother 73       heart failure   Lung cancer Maternal Grandfather        was a smoker    Cancer Maternal Grandfather        lung   Heart disease Paternal Grandmother        AMI   Heart disease Paternal Grandfather        AMI   Colon cancer Neg Hx    Social History   Socioeconomic History   Marital status: Divorced    Spouse name: Not on file   Number of children: 1   Years of education: Not on file   Highest education level: Not on file  Occupational History   Occupation: IT sales professional: [employer]  Tobacco Use   Smoking status: Never   Smokeless tobacco: Never  Vaping Use   Vaping status: Never Used  Substance and Sexual Activity   Alcohol use: Not Currently    Comment: occ   Drug use: No    Comment: former maryjuana use- quit in 1995   Sexual activity: Yes    Partners: Male    Birth control/protection: Surgical  Other Topics Concern   Not on file  Social History Narrative   Marital status: divorced x 2; dating seriously x 3 years.        Children:  1 daughter (63); no grandchildren      Lives:  With boyfriend.        Employment: works at [employer]      Tobacco: none      Alcohol: rarely; socially      Exercise: none; has treadmill and elliptical and bikes   Coffee in am / 1/2 of coke in afternoon    High school education      01/29/20   From: the area   Living: alone - but boyfriend living with her Genelle Bal (2020) but knew each other in high school   Work: on disability - working on appeal      Family: one daughter Leonette Nutting - one grandchild coming in January       Enjoys: relax and watch tv, use to like fishing/boating/rafting, reading, crochet       Exercise: rehab exercises  Diet: not great - limited intake      Safety   Seat belts: Yes    Guns: Yes  and secure   Right handed   One story home   Drinks caffeine   Social Determinants of Health   Financial Resource Strain: Not on file  Food Insecurity: Not on file  Transportation Needs: Not on file  Physical Activity: Not on file  Stress: Not on  file  Social Connections: Not on file   Past Surgical History:  Procedure Laterality Date   ABDOMINAL HYSTERECTOMY     still with both ovaries   BREAST BIOPSY Left 12/19/2022   [place] LT BREAST BX W LOC DEV 1ST LESION IMG BX SPEC US GUIDE 12/19/2022 [facility]   CATARACT EXTRACTION Bilateral 2015   COLONOSCOPY     CYSTOSCOPY W/ URETERAL STENT PLACEMENT Right 05/23/2017   Procedure: CYSTOSCOPY WITH RETROGRADE PYELOGRAM/URETERAL RIGHT STENT PLACEMENT;  Surgeon: Alfredo Martinez, MD;  Location: [facility];  Service: Urology;  Laterality: Right;   CYSTOSCOPY W/ URETERAL STENT PLACEMENT Right 06/04/2017   Procedure: CYSTOSCOPY WITHRIGHT RETROGRADE PYELOGRAMRIGHT /URETEREOSCOPY  STENT PLACEMENT;  Surgeon: Crista Elliot, MD;  Location: [facility];  Service: Urology;  Laterality: Right;   ENDOMETRIAL ABLATION     ESOPHAGOGASTRODUODENOSCOPY (EGD) WITH PROPOFOL N/A 08/16/2022   Procedure: ESOPHAGOGASTRODUODENOSCOPY (EGD) WITH PROPOFOL;  Surgeon: Toney Reil, MD;  Location: [facility];  Service: Gastroenterology;  Laterality: N/A;   EYE SURGERY     HEMORRHOID SURGERY  2011   INCONTINENCE SURGERY     INNER EAR SURGERY     X 2   RECTAL PROLAPSE REPAIR     RETINAL DETACHMENT SURGERY Bilateral 2000   TOTAL KNEE ARTHROPLASTY Left 04/29/2019   Procedure: LEFT TOTAL KNEE ARTHROPLASTY;  Surgeon: Kathryne Hitch, MD;  Location: [facility];  Service: Orthopedics;  Laterality: Left;   TUBAL LIGATION     VEIN SURGERY Left 04/2010   Past Medical History:  Diagnosis Date   Allergy    Anal fissure    Anemia    borderline   Anxiety    Arthritis    Blood clot in vein    left leg   Detached retina    BOTH SCLERA BUCKLE BOTH EYES   DVT (deep venous thrombosis) (HCC)    left leg   Endometriosis    Family history of adverse reaction to anesthesia    DAUGHTER POST OP PONV   GERD (gastroesophageal reflux disease)    Glaucoma    RIGHT WORST THAN LEFT   Heart murmur     "caused by anxiety"    History of hemorrhoids    History of kidney stones    Hypertension    IBS (irritable bowel syndrome)    Perforated eardrum, right    SMALL HOLE   PONV (postoperative nausea and vomiting)    Pulmonary embolism (HCC) 6 YRS AGO   Status post arthroscopy of left knee 08/06/2017   LMP 10/11/2010   Opioid Risk Score:   Fall Risk Score:  `1  Depression screen PHQ 2/9     01/29/2023    2:27 PM 10/24/2022    2:28 PM 10/11/2022   10:15 AM 08/25/2022   10:50 AM 07/11/2022   12:44 PM 03/28/2022   10:38 AM 10/12/2021   11:35 AM  Depression screen PHQ 2/9  Decreased Interest 0  2 2 1 1 3   Down, Depressed, Hopeless 0  1 1 1 1 1   PHQ -  2 Score 0  3 3 2 2 4   Altered sleeping  1 0 3   1  Tired, decreased energy  1 3 3   3   Change in appetite   2 0   0  Feeling bad or failure about yourself    0 0   0  Trouble concentrating   0 0   0  Moving slowly or fidgety/restless   0 0   0  Suicidal thoughts   0 0   0  PHQ-9 Score   8 9   8   Difficult doing work/chores   Very difficult Extremely dIfficult   Somewhat difficult   Review of Systems  Constitutional: Negative.        Weight loss  HENT: Negative.   Eyes: Negative.   Respiratory: Positive for shortness of breath.   Cardiovascular: Negative.   Gastrointestinal: Negative.   Endocrine: Negative.   Genitourinary: Negative.   Musculoskeletal: Positive for arthralgias, back pain and gait problem.       Spasms  Skin: Negative.   Allergic/Immunologic: Negative.   Neurological: Positive for tremors and numbness.  Hematological: Negative.   Psychiatric/Behavioral:       Anxiety  All other systems reviewed and are negative.      Objective:  PRIOR EXAM: Gen: no distress, normal appearing, BMI 25.22, weight 159 lbs, BP 137/83 HEENT: oral mucosa pink and moist, NCAT Cardio: Reg rate Chest: normal effort, normal rate of breathing Abd: soft, non-distended Ext: no edema Psych: pleasant, normal affect Skin:  intact Neuro: Alert and oriented x3 MSK: ambulates with cane  Assessment & Plan:  Mrs. Zufall is a 57 year old woman who presents for follow-up:   1) Left knee pain post-surgery: -discussed that knee pain has worsened since she has been weaning off her medications to avoid their potential side effects -discussed that even activities such as changing her bed sheets can aggravate her pain -She has a plan for repeat surgical procedure with Dr. Magnus Ivan that will hopefully provide relief.  -She has been icing regularly- recommended three tines per day for 15 minutes. Discussed that this decreases blood flow and can impair healing but help with swelling and pain relief.  -discussed the limitations of her pain on her function -discussed extracorporeal shockwave therapy and how this can help break apart scar tissue -unable to work due the level of her pain -discussed current impact on her life -continue cane for balance -She has been through physical therapy and performs her exercises diligently. --Discussed Sprint PNS system as an option of pain treatment via neuromodulation. Provided following link for patient to learn more about the system: https://www.sprtherapeutics.com/. She does not like the feeling of things under her skin and defers that option.  -She weaned herself all the steroids.  -Provided with a pain relief journal and discussed that it contains foods and lifestyle tips to naturally help to improve pain. Discussed that these lifestyle strategies are also very good for health unlike some medications which can have negative side effects. Discussed that the act of keeping a journal can be therapeutic and helpful to realize patterns what helps to trigger and alleviate pain.   -discussed mechanism of action of low dose naltrexone as an opioid receptor antagonist which stimulates your body's production of its own natural endogenous opioids, helping to decrease pain. Discussed that it can also  decrease T cell response and thus be helpful in decreasing inflammation, and symptoms of brain fog, fatigue, anxiety, depression, and allergies.  Discussed that this medication needs to be compounded at a compounding pharmacy and can more expensive. Discussed that I usually start at 1mg  and if this is not providing enough relief then I titrate upward on a monthly basis.   -Discussed Qutenza as an option for neuropathic pain control. Discussed that this is a capsaicin patch, stronger than capsaicin cream. Discussed that it is currently approved for diabetic peripheral neuropathy and post-herpetic neuralgia, but that it has also shown benefit in treating other forms of neuropathy. Provided patient with link to site to learn more about the patch: https://www.clark.biz/. Discussed that the patch would be placed in office and benefits usually last 3 months. Discussed that unintended exposure to capsaicin can cause severe irritation of eyes, mucous membranes, respiratory tract, and skin, but that Qutenza is a local treatment and does not have the systemic side effects of other nerve medications. Discussed that there may be pain, itching, erythema, and decreased sensory function associated with the application of Qutenza. Side effects usually subside within 1 week. A cold pack of analgesic medications can help with these side effects. Blood pressure can also be increased due to pain associated with administration of the patch.  -continue ice     -continue Robaxin as she is nervous about feeling too sleepy with the Tizanidine, and the Robaxin does help.   Continue gabapentin to 600mg  TID PRN.   -Continue neoprene knee sleeve for proprioception  -She has tried Celebrex and Tramadol without benefit. She had respiratory distress with hydrocodone.   -She had worse pain with steroid injection in her left knee.   -Discussed steroids but she did not like the side effect profile. Discussed the different steroid  options. She weaned off these because she developed mouth sores.   -Continue turmeric and cherries.   -Continue Lidocaine patches today.   -She would like rheum panel checked: ordered, reviewed results which were negative, and discussed with patient.  -Refilled meloxicam.   2) Bunion:  -continue orthotics  3) Irritable bowel syndrome: -She does not eat much sugar or sweets.  -She drinks 1 coke per day.  -She cuts tea out.  -She drinks water, coffee.  -She has been going more regularly and has not needed her Miralax.  -food allergy testing ordered  4) Impaired Concentration: - This persists -She forgets things easily.  -She tries not to multitask when she was with her grandmother.   5) Anxiety: The Gabapentin is helping with this as well, continue -discussed plan to start ecitalopram and that she has cleared this with her ophthalmologist -discussed increasing trazodone -Buspar prescribed  6) Vulvodynia: -discussed various treatment options: biofeedback, medications, continue estradiol.  -continue Amitriptyline 10mg  HS, discussed moving dose earlier in the evening.   7) Cervical and lumbar myofascial pain: -try massage -discussed benefits of therapy and trigger point injections -apply lidocaine patch.   8) Bilateral hand pain: likely secondary to arthritis compounded by carpal tunnel syndrome -continue Gabapentin which is helping -continue f/u with surgery as planned.   9) Low back pain, appears to be secondary to face arthritis -discussed XR, but she defers due to potential cost -discussed surgery with an orthopedic and she would like to consider this but her insurance would not cover it.  -discussed lack of improvement with injections from Dr. Modesto Charon.  -continue lidocaine patches, discussed that these make her feel weird when she takes them off.  - Ketamine 10%, Baclofen 2%, Cyclobenzaprine 2%, Ketoprofen 10%, Gabapentin 6%, Bupivacaine 1%, Amitryptiline 5%, clonidine  0.2%  Dispense 240 g  Apply 1-2 g to affected area 3-4 times per day  -continue postural correction -Provided with a pain relief journal and discussed that it contains foods and lifestyle tips to naturally help to improve pain. Discussed that these lifestyle strategies are also very good for health unlike some medications which can have negative side effects. Discussed that the act of keeping a journal can be therapeutic and helpful to realize patterns what helps to trigger and alleviate pain.   -Discussed following foods that may reduce pain: 1) Ginger (especially studied for arthritis)- reduce leukotriene production to decrease inflammation 2) Blueberries- high in phytonutrients that decrease inflammation 3) Salmon- marine omega-3s reduce joint swelling and pain 4) Pumpkin seeds- reduce inflammation 5) dark chocolate- reduces inflammation 6) turmeric- reduces inflammation 7) tart cherries - reduce pain and stiffness 8) extra virgin olive oil - its compound olecanthal helps to block prostaglandins  9) chili peppers- can be eaten or applied topically via capsaicin 10) mint- helpful for headache, muscle aches, joint pain, and itching 11) garlic- reduces inflammation  Link to further information on diet for chronic pain: http://www.bray.com/   10) shoulder pain -used to get shots in the pt, but laying on her back has helped to decrease her shoulder pain  11) Acid reflux -continues famotidine.   11) Soreness in legs -vascular ultrasound ordered of both legs -discussed her history of clots -discussed that a D-dimer test can suggest clot if elevated.   12) Irritable bowel syndrome -discussed her current diet -discussed that she does not eat breakfast due to nausea -discussed her diarrhea and constipation, more diarrheal type at this time.  -discussed her aunt's advise to have a tsp coconut oil daily.  -recommended  bioptemizer's magnesium breakthrough -reviewed food allergies with her and made goal for 4 week elimination of foods with the highest responses  13) Insomnia/Shortness of breath with history of clots -discussed that there is no evidence of PE on her CTA -discussed her follow-up with PCP -discussed that her vascular ultrasound of her lower extremities shows a chronic left peroneal CVT  -recommended starting nattokinase to help thin the blood and lower her risk for clots, or to eat natto -discussed the importance of movement in clot prevention -referred for sleep study for shortness of breath  14) Impaired memory: -discussed her memory difficulties. -discussed that memory is improving since stopping her klonopin and decreasing added sugar in her diet -discussed that some studies have linked chronic PPI use to memory impairment -discussed safe way to wean off these medications slowly  15) Hemorroids: Recommended Epsom salt baths  16) prediabetes -advised switching from sweetened black tea to black tea with allulose or honey - recommended avoiding processed foods/added sugars/bread/pasta/rice -recommended drinking a glass of water with either apple cide vinegar or lemon -recommended reading Wheat Belly and Grain Brain -recommended eating fruits, vegetables, yogurt, nuts, olive oil -discussed risks and benefits of metformin -discussed that repeat labs show normalization of HgbA1c! -discussed that she does not eat much anymore -encouraged getting enough protein -HgbA1c reviewed and is 5.8, discussed with patient  17) Transaminitis -recommended avoiding ultraprocessed foods -recommended avoiding/minimizing tylenol -recommended supplement NAC 600mg  BID  -recommended drinking dandelion and milk thistle teas -commended on stopping drinking soda products! -recommended avoiding processed creamer in coffee, continue drinking coffee -discussed that repeat CMP shows normalization of liver  enzymes! -check CMP today  18) Right knee pain: -encouraged use of exercise bike to strengthen quadriceps tendon  19) Dry mouth: -stop amitriptyline  20) Benzodiazepine withdrawal: -discussed that this should be weaned slowly  31) Restless leg syndrome:  -discussed requip, will prescribe  32) Insomnia: -prescribed trazodone, discussed that this promotes increased deep sleep, discussed that this has helped -recommended tart cherry juice -discussed that she has been on Klonopin for 12 years and is trying to wean off as her PCP is concerned it may be contributing to her brain fog -discussed that Klonopin can contribute to memory decline and commended her for trying to wean off -discussed that she has been having dry mouth and she is not sure if it is from the Requip. She has stopped taking this in case and dry mouth has continued -discussed that robaxin can also cause dry mouth -discussed taking muscle relaxer earlier in the evening so she does not have to wake at night to drink water  33) Chest pain: -discussed current symptoms -prescribed nitroglycerin  34) Elevated BUN -encouraged 6-8 glasses of water per day -discussed that creatinine is normal  10 minutes spent in discussion of her knee pain, HgbA1c, advised using black tea with allulose or honey rather than sweetened black tea, HgbA1c reviewed and is 5.8- discussed with patient, discussed that she has been sleeping better with trazodone, discussed elevated BUN and recommended drinking 6-8 glasses of water per day, discussed that creatinine is normal

## 2023-02-06 ENCOUNTER — Ambulatory Visit: Payer: Medicare HMO | Admitting: Gastroenterology

## 2023-02-07 ENCOUNTER — Encounter: Payer: Self-pay | Admitting: Gastroenterology

## 2023-02-07 ENCOUNTER — Ambulatory Visit: Payer: Medicare HMO | Admitting: Gastroenterology

## 2023-02-07 VITALS — BP 116/70 | HR 74 | Temp 97.6°F | Wt 160.0 lb

## 2023-02-07 DIAGNOSIS — M9905 Segmental and somatic dysfunction of pelvic region: Secondary | ICD-10-CM

## 2023-02-07 DIAGNOSIS — K219 Gastro-esophageal reflux disease without esophagitis: Secondary | ICD-10-CM | POA: Diagnosis not present

## 2023-02-07 DIAGNOSIS — K581 Irritable bowel syndrome with constipation: Secondary | ICD-10-CM | POA: Diagnosis not present

## 2023-02-07 DIAGNOSIS — Z91018 Allergy to other foods: Secondary | ICD-10-CM

## 2023-02-07 DIAGNOSIS — R072 Precordial pain: Secondary | ICD-10-CM | POA: Diagnosis not present

## 2023-02-07 NOTE — Patient Instructions (Signed)
Diet for Irritable Bowel Syndrome When you have irritable bowel syndrome (IBS), it is very important to follow the eating habits that are best for your condition. IBS may cause various symptoms, such as pain in the abdomen, constipation, or diarrhea. Choosing the right foods can help to ease the discomfort from these symptoms. Work with your health care provider and dietitian to find the eating plan that will help to control your symptoms. What are tips for following this plan?  Keep a food diary. This will help you identify foods that cause symptoms. Write down: What you eat and when you eat it. What symptoms you have. When symptoms occur in relation to your meals, such as "pain in abdomen 2 hours after dinner." Eat your meals slowly and in a relaxed setting. Aim to eat 5-6 small meals per day. Do not skip meals. Drink enough fluid to keep your urine pale yellow. Ask your health care provider if you should take an over-the-counter probiotic to help restore healthy bacteria in your gut (digestive tract). Probiotics are foods that contain good bacteria and yeasts. Your dietitian may have specific dietary recommendations for you based on your symptoms. Your dietitian may recommend that you: Avoid foods that cause symptoms. Talk with your dietitian about other ways to get the same nutrients that are in those problem foods. Avoid foods with gluten. Gluten is a protein that is found in rye, wheat, and barley. Eat more foods that contain soluble fiber. Examples of foods with high soluble fiber include oats, seeds, and certain fruits and vegetables. Take a fiber supplement if told by your dietitian. Reduce or avoid certain foods called FODMAPs. These are foods that contain sugars that are hard for some people to digest. Ask your health care provider which foods to avoid. What foods should I avoid? The following are some foods and drinks that may make your symptoms worse: Fatty foods, such as french  fries. Foods that contain gluten, such as pasta and cereal. Dairy products, such as milk, cheese, and ice cream. Spicy foods. Alcohol. Products with caffeine, such as coffee, tea, or chocolate. Carbonated drinks, such as soda. Foods that are high in FODMAPs. These include certain fruits and vegetables. Products with sweeteners such as honey, high fructose corn syrup, sorbitol, and mannitol. The items listed above may not be a complete list of foods and beverages you should avoid. Contact a dietitian for more information. What foods are good sources of fiber? Your health care provider or dietitian may recommend that you eat more foods that contain fiber. Fiber can help to reduce constipation and other IBS symptoms. Add foods with fiber to your diet a little at a time so your body can get used to them. Too much fiber at one time might cause gas and swelling of your abdomen. The following are some foods that are good sources of fiber: Berries, such as raspberries, strawberries, and blueberries. Tomatoes. Carrots. Brown rice. Oats. Seeds, such as chia and pumpkin seeds. The items listed above may not be a complete list of recommended sources of fiber. Contact your dietitian for more options. Where to find more information International Foundation for Functional Gastrointestinal Disorders: aboutibs.org National Institute of Diabetes and Digestive and Kidney Diseases: niddk.nih.gov Summary When you have irritable bowel syndrome (IBS), it is very important to follow the eating habits that are best for your condition. IBS may cause various symptoms, such as pain in the abdomen, constipation, or diarrhea. Choosing the right foods can help to ease the   discomfort that comes from symptoms. Your health care provider or dietitian may recommend that you eat more foods that contain fiber. Keep a food diary. This will help you identify foods that cause symptoms. This information is not intended to replace  advice given to you by your health care provider. Make sure you discuss any questions you have with your health care provider. Document Revised: 05/17/2021 Document Reviewed: 05/17/2021 Elsevier Patient Education  2024 [employer].

## 2023-02-07 NOTE — Progress Notes (Signed)
Arlyss Repress, MD [address]  [address]  [place], [place] [address]  Main: [phone]  Fax: [phone]    Gastroenterology Consultation  Referring Provider:     Mort Sawyers, FNP Primary Care Physician:  Mort Sawyers, FNP Primary Gastroenterologist:  Dr. Arlyss Repress Reason for Consultation: Chronic GERD        HPI:   Anandi Sirna is a 57 y.o. female referred by  Mort Sawyers, FNP  for consultation & management of chronic symptoms of burning in the epigastric area as well as in the chest.  Has been taking Pepcid 20 mg twice daily which provides minimal relief only.  Her PCP has switched her to pantoprazole 40 mg once daily.  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  She denies any difficulty swallowing.  She is followed by pulmonary for sleep apnea  Follow-up visit 02/07/2023 Ms. Balsam is here for follow-up of chronic GERD.  She underwent upper endoscopy with esophageal biopsies which were negative.  She is currently managing her reflux symptoms alternating between famotidine 40 mg and Protonix at 20 mg or 40 mg.  She is evaluated by allergy and immunology, blood test result shows that she has allergies/intolerance to several foods.  Patient is waiting to be seen by another allergy and immunology specialist as a second opinion.  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  Patient also has environmental allergies  NSAIDs: None  Antiplts/Anticoagulants/Anti thrombotics: None  GI Procedures: Upper endoscopy 08/16/2022 Normal exam DIAGNOSIS:  A. ESOPHAGUS; COLD BIOPSY:  - SQUAMOUS MUCOSA WITH NO SIGNIFICANT HISTOPATHOLOGIC CHANGES (NO  SIGNIFICANT INFLAMMATION, INTRAEPITHELIAL EOSINOPHILS, DYSPLASIA AND  MALIGNANCY IDENTIFIED).  Screening colonoscopy 01/07/2016  Surgical [P], cecum, polyp - HYPERPLASTIC COLONIC POLYP. - NO ADENOMATOUS  CHANGE OR MALIGNANCY IDENTIFIED. Past Medical History:  Diagnosis Date   Allergy    Anal fissure    Anemia    borderline   Anxiety    Arthritis    Blood clot in vein    left leg   Detached retina    BOTH SCLERA BUCKLE BOTH EYES   DVT (deep venous thrombosis) (HCC)    left leg   Endometriosis    Family history of adverse reaction to anesthesia    DAUGHTER POST OP PONV   GERD (gastroesophageal reflux disease)    Glaucoma    RIGHT WORST THAN LEFT   Heart murmur    "caused by anxiety"    History of hemorrhoids    History of kidney stones    Hypertension    IBS (irritable bowel syndrome)    Perforated eardrum, right    SMALL HOLE   PONV (postoperative nausea and vomiting)    Pulmonary embolism (HCC) 6 YRS AGO   Status post arthroscopy of left knee 08/06/2017    Past Surgical History:  Procedure Laterality Date   ABDOMINAL HYSTERECTOMY     still with both ovaries   BREAST BIOPSY Left 12/19/2022   [place] LT BREAST BX W LOC DEV 1ST LESION IMG BX SPEC US GUIDE 12/19/2022 [facility]   CATARACT EXTRACTION Bilateral 2015   COLONOSCOPY     CYSTOSCOPY W/ URETERAL STENT PLACEMENT Right 05/23/2017   Procedure: CYSTOSCOPY WITH RETROGRADE PYELOGRAM/URETERAL RIGHT STENT PLACEMENT;  Surgeon: Alfredo Martinez, MD;  Location: [facility];  Service: Urology;  Laterality: Right;   CYSTOSCOPY W/ URETERAL STENT PLACEMENT Right 06/04/2017   Procedure:  CYSTOSCOPY WITHRIGHT RETROGRADE PYELOGRAMRIGHT /URETEREOSCOPY  STENT PLACEMENT;  Surgeon: Crista Elliot, MD;  Location: [facility];  Service: Urology;  Laterality: Right;   ENDOMETRIAL ABLATION     ESOPHAGOGASTRODUODENOSCOPY (EGD) WITH PROPOFOL N/A 08/16/2022   Procedure: ESOPHAGOGASTRODUODENOSCOPY (EGD) WITH PROPOFOL;  Surgeon: Toney Reil, MD;  Location: [facility];  Service: Gastroenterology;  Laterality: N/A;   EYE SURGERY     HEMORRHOID SURGERY  2011   INCONTINENCE SURGERY     INNER EAR SURGERY     X 2   RECTAL  PROLAPSE REPAIR     RETINAL DETACHMENT SURGERY Bilateral 2000   TOTAL KNEE ARTHROPLASTY Left 04/29/2019   Procedure: LEFT TOTAL KNEE ARTHROPLASTY;  Surgeon: Kathryne Hitch, MD;  Location: [facility];  Service: Orthopedics;  Laterality: Left;   TUBAL LIGATION     VEIN SURGERY Left 04/2010     Current Outpatient Medications:    Acetylcysteine (NAC) 600 MG CAPS, Take 600 mg by mouth 2 (two) times daily., Disp: , Rfl:    acyclovir (ZOVIRAX) 400 MG tablet, Take 1 tablet by mouth daily., Disp: , Rfl:    albuterol (VENTOLIN HFA) 108 (90 Base) MCG/ACT inhaler, INHALE 1-2 PUFFS BY MOUTH EVERY 6 HOURS AS NEEDED FOR WHEEZE OR SHORTNESS OF BREATH, Disp: 8.5 each, Rfl: 2   bimatoprost (LUMIGAN) 0.03 % ophthalmic solution, Place 1 drop into both eyes at bedtime., Disp: , Rfl:    busPIRone (BUSPAR) 5 MG tablet, Take 1 tablet (5 mg total) by mouth 3 (three) times daily., Disp: 90 tablet, Rfl: 3   Cholecalciferol (VITAMIN D) 50 MCG (2000 UT) tablet, 1 tablet, Disp: , Rfl:    clobetasol (TEMOVATE) 0.05 % external solution, APPLY TO AFFECTED AREA TWICE A DAY, Disp: 50 mL, Rfl: 0   EPINEPHrine 0.3 mg/0.3 mL IJ SOAJ injection, Inject 0.3 mg into the muscle as needed for anaphylaxis., Disp: 1 each, Rfl: 0   estradiol (ESTRACE) 0.1 MG/GM vaginal cream, Insert pea-sized amount nightly for 2 weeks then every other night, Disp: , Rfl:    famotidine (PEPCID) 40 MG tablet, Take 1 tablet (40 mg total) by mouth daily., Disp: 90 tablet, Rfl: 1   famotidine (PEPCID) 40 MG tablet, Take 1 tablet by mouth daily., Disp: , Rfl:    gabapentin (NEURONTIN) 100 MG capsule, Take 2 capsules (200 mg total) by mouth at bedtime., Disp: 180 capsule, Rfl: 1   gabapentin (NEURONTIN) 600 MG tablet, Take 1 tablet (600 mg total) by mouth 3 (three) times daily., Disp: 90 tablet, Rfl: 3   lidocaine (LIDODERM) 5 %, Apply 1 patch topically daily., Disp: , Rfl:    LUMIGAN 0.01 % SOLN, 1 drop at bedtime., Disp: , Rfl:    meloxicam (MOBIC) 15 MG  tablet, Take 1 tablet (15 mg total) by mouth daily., Disp: 30 tablet, Rfl: 2   methocarbamol (ROBAXIN) 500 MG tablet, Take 1 tablet (500 mg total) by mouth every 8 (eight) hours as needed for muscle spasms., Disp: 90 tablet, Rfl: 1   mometasone (NASONEX) 50 MCG/ACT nasal spray, Place 2 sprays into the nose daily., Disp: 1 each, Rfl: 12   nebivolol (BYSTOLIC) 5 MG tablet, TAKE 1 TABLET BY MOUTH EVERY DAY, Disp: 90 tablet, Rfl: 3   nitroGLYCERIN (NITROSTAT) 0.4 MG SL tablet, Place 1 tablet (0.4 mg total) under the tongue every 5 (five) minutes as needed for chest pain., Disp: 30 tablet, Rfl: 12   Nitroglycerin (RECTIV) 0.4 % OINT, Place 0.4 inches rectally 2 (two)  times daily as needed (For anal fissures.)., Disp: 30 g, Rfl: 1   ondansetron (ZOFRAN-ODT) 4 MG disintegrating tablet, 1 tablet on the tongue and allow to dissolve Orally Once a day for 30 day(s), Disp: 20 tablet, Rfl: 0   pantoprazole (PROTONIX) 20 MG tablet, Take 1 tablet (20 mg total) by mouth daily., Disp: 90 tablet, Rfl: 3   polyethylene glycol powder (GLYCOLAX/MIRALAX) 17 GM/SCOOP powder, See admin instructions., Disp: , Rfl:    primidone (MYSOLINE) 50 MG tablet, Take 1 tablet in morning and 3 tablets at bedtime., Disp: 120 tablet, Rfl: 5   traZODone (DESYREL) 50 MG tablet, Take 1 tablet (50 mg total) by mouth at bedtime as needed for sleep., Disp: 90 tablet, Rfl: 3   vitamin B-12 (CYANOCOBALAMIN) 1000 MCG tablet, Take 1 tablet (1,000 mcg total) by mouth daily., Disp: 30 tablet, Rfl: 3   Zinc 50 MG TABS, 1 tablet Orally Once a day for 30 day(s), Disp: , Rfl:     Family History  Problem Relation Age of Onset   Emphysema Mother        smoker   Allergies Mother    Asthma Mother    Heart disease Mother        AMI as cause of death   COPD Mother    Asthma Father    Heart disease Father        AMI as cause of death   Diabetes Father    Asthma Brother    Heart disease Brother 62       heart failure   Lung cancer Maternal  Grandfather        was a smoker   Cancer Maternal Grandfather        lung   Heart disease Paternal Grandmother        AMI   Heart disease Paternal Grandfather        AMI   Colon cancer Neg Hx      Social History   Tobacco Use   Smoking status: Never   Smokeless tobacco: Never  Vaping Use   Vaping status: Never Used  Substance Use Topics   Alcohol use: Not Currently    Comment: occ   Drug use: No    Comment: former maryjuana use- quit in 1995    Allergies as of 02/07/2023 - Review Complete 02/07/2023  Allergen Reaction Noted   Bactrim [sulfamethoxazole-trimethoprim] Shortness Of Breath 10/23/2022   Bee venom Anaphylaxis and Other (See Comments) 09/10/2019   Penicillins Other (See Comments) and Swelling 10/11/2010   Dextromethorphan  03/22/2021   Elavil [amitriptyline] Other (See Comments) 10/31/2022   Hydrocodone-acetaminophen Other (See Comments) 10/04/2011   Oxycodone Other (See Comments) 10/04/2011   Penicillin g  03/27/2022   Dextromethorphan polistirex er Other (See Comments) 11/08/2010   Fluconazole  08/02/2018   Hydrocodone Nausea And Vomiting and Nausea Only 02/01/2012   Oxycodone-acetaminophen Other (See Comments) 05/23/2017   Tape Itching and Other (See Comments) 08/17/2017    Review of Systems:    All systems reviewed and negative except where noted in HPI.   Physical Exam:  BP 116/70   Pulse 74   Temp 97.6 F (36.4 C) (Oral)   Wt 160 lb (72.6 kg)   LMP 10/11/2010   BMI 25.06 kg/m  Patient's last menstrual period was 10/11/2010.  General:   Alert,  Well-developed, well-nourished, pleasant and cooperative in NAD Head:  Normocephalic and atraumatic. Eyes:  Sclera clear, no icterus.   Conjunctiva pink. Ears:  Normal auditory  acuity. Nose:  No deformity, discharge, or lesions. Mouth:  No deformity or lesions,oropharynx pink & moist. Neck:  Supple; no masses or thyromegaly. Lungs:  Respirations even and unlabored.  Clear throughout to auscultation.    No wheezes, crackles, or rhonchi. No acute distress. Heart:  Regular rate and rhythm; no murmurs, clicks, rubs, or gallops. Abdomen:  Normal bowel sounds. Soft, non-tender and non-distended without masses, hepatosplenomegaly or hernias noted.  No guarding or rebound tenderness.   Rectal: Not performed Msk:  Symmetrical without gross deformities. Good, equal movement & strength bilaterally. Pulses:  Normal pulses noted. Extremities:  No clubbing or edema.  No cyanosis. Neurologic:  Alert and oriented x3;  grossly normal neurologically. Skin:  Intact without significant lesions or rashes. No jaundice. Psych:  Alert and cooperative. Normal mood and affect.  Imaging Studies: Reviewed  Assessment and Plan:   Jahda Joas is a 57 y.o. female with history of endometriosis, hemorrhoids s/p stapled hemorrhoidectomy, history of iron deficiency anemia which has resolved, generalized anxiety disorder, sleep apnea, chronic GERD  Chronic GERD EGD with esophageal biopsies were unremarkable, no evidence of eosinophilic esophagitis Symptoms are under control with alternating courses of Pepcid 40 mg daily and Protonix 20mg  or 40mg  daily before meals at a time Discussed with patient regarding Bravo pH study, she would like to obtain second opinion with allergy immunology before proceeding with this procedure  Multiple food allergies and IBS-constipation Recommend celiac disease panel  Pelvic floor dyssynergia Patient reports undergoing pelvic floor PT through urology at [facility] for about 3 months, with not much benefit Offered her to consider pelvic floor PT at [facility] rehab center, she says she will think about it   Follow up as needed   Arlyss Repress, MD

## 2023-02-11 ENCOUNTER — Other Ambulatory Visit: Payer: Self-pay | Admitting: Internal Medicine

## 2023-02-11 ENCOUNTER — Other Ambulatory Visit: Payer: Self-pay | Admitting: Physical Medicine and Rehabilitation

## 2023-02-11 DIAGNOSIS — K219 Gastro-esophageal reflux disease without esophagitis: Secondary | ICD-10-CM

## 2023-02-11 LAB — CELIAC DISEASE PANEL
Endomysial IgA: NEGATIVE
IgA/Immunoglobulin A, Serum: 216 mg/dL (ref 87–352)
Transglutaminase IgA: <2 U/mL (ref 0–3)

## 2023-02-12 ENCOUNTER — Other Ambulatory Visit: Payer: Self-pay | Admitting: Physical Medicine and Rehabilitation

## 2023-02-12 DIAGNOSIS — G4733 Obstructive sleep apnea (adult) (pediatric): Secondary | ICD-10-CM | POA: Diagnosis not present

## 2023-02-12 MED ORDER — GABAPENTIN 600 MG PO TABS: 600.0000 mg | ORAL_TABLET | Freq: Three times a day (TID) | ORAL | 3 refills | Status: DC

## 2023-02-13 DIAGNOSIS — G4733 Obstructive sleep apnea (adult) (pediatric): Secondary | ICD-10-CM | POA: Diagnosis not present

## 2023-02-14 DIAGNOSIS — G4733 Obstructive sleep apnea (adult) (pediatric): Secondary | ICD-10-CM | POA: Diagnosis not present

## 2023-02-21 ENCOUNTER — Other Ambulatory Visit: Payer: Self-pay | Admitting: Physical Medicine and Rehabilitation

## 2023-02-22 ENCOUNTER — Telehealth: Payer: Self-pay | Admitting: Cardiology

## 2023-02-22 NOTE — Telephone Encounter (Signed)
  Sharon Bathe, MD 02/16/2023  6:12 AM EDT       Sinus rhythm AVG HR 73 bpm   Brief atrial tachycardia - benign   Rare PAC and PVC - occasionally symptomatic   No atrial fibrillation   Reassuring    The patient has been notified of the result and verbalized understanding.  All questions (if any) were answered. Loa Socks, LPN 12/25/2954 21:30 AM

## 2023-02-22 NOTE — Telephone Encounter (Signed)
Pt states that she is returning call from last week regarding test results. Please advise

## 2023-03-15 ENCOUNTER — Ambulatory Visit: Payer: Medicare HMO

## 2023-03-15 DIAGNOSIS — G4733 Obstructive sleep apnea (adult) (pediatric): Secondary | ICD-10-CM | POA: Diagnosis not present

## 2023-03-16 DIAGNOSIS — G4733 Obstructive sleep apnea (adult) (pediatric): Secondary | ICD-10-CM | POA: Diagnosis not present

## 2023-03-27 ENCOUNTER — Ambulatory Visit (INDEPENDENT_AMBULATORY_CARE_PROVIDER_SITE_OTHER): Payer: Medicare HMO

## 2023-03-27 VITALS — Ht 67.0 in | Wt 165.0 lb

## 2023-03-27 DIAGNOSIS — Z0000 Encounter for general adult medical examination without abnormal findings: Secondary | ICD-10-CM | POA: Diagnosis not present

## 2023-03-27 NOTE — Progress Notes (Addendum)
Subjective:   Sharon [name] is a 57 y.o. female who presents for Medicare Annual (initial ) preventive examination.  Visit Complete: Virtual I connected with  Sharon [name] on 03/30/23 by a audio enabled telemedicine application and verified that I am speaking with the correct person using two identifiers.  Patient Location: Home  Provider Location: Home Office  I discussed the limitations of evaluation and management by telemedicine. The patient expressed understanding and agreed to proceed.  Vital Signs: Because this visit was a virtual/telehealth visit, some criteria may be missing or patient reported. Any vitals not documented were not able to be obtained and vitals that have been documented are patient reported.  Patient Medicare AWV questionnaire was completed by the patient on 03/27/2023; I have confirmed that all information answered by patient is correct and no changes since this date.  Cardiac Risk Factors include: advanced age (>35men, >57 women);dyslipidemia;hypertension     Objective:    Today's Vitals   03/27/23 1408  Weight: 165 lb (74.8 kg)  Height: 5\' 7"  (1.702 m)   Body mass index is 25.84 kg/m.     03/27/2023    2:13 PM 08/16/2022    7:43 AM 11/30/2021    2:59 PM 05/25/2020   10:22 AM 11/08/2019    7:11 AM 05/29/2019    2:51 PM 04/30/2019   10:13 AM  Advanced Directives  Does Patient Have a Medical Advance Directive? Yes No Yes Yes No No No  Type of Estate agent of [place];Living will        Does patient want to make changes to medical advance directive?       No - Patient declined  Copy of Healthcare Power of Attorney in Chart? No - copy requested        Would patient like information on creating a medical advance directive?      No - Patient declined No - Patient declined    Current Medications (verified) Outpatient Encounter Medications as of 03/27/2023  Medication Sig   Acetylcysteine (NAC) 600 MG CAPS Take 600 mg  by mouth 2 (two) times daily.   acyclovir (ZOVIRAX) 400 MG tablet Take 1 tablet by mouth daily.   albuterol (VENTOLIN HFA) 108 (90 Base) MCG/ACT inhaler INHALE 1-2 PUFFS BY MOUTH EVERY 6 HOURS AS NEEDED FOR WHEEZE OR SHORTNESS OF BREATH   bimatoprost (LUMIGAN) 0.03 % ophthalmic solution Place 1 drop into both eyes at bedtime.   busPIRone (BUSPAR) 5 MG tablet TAKE 1 TABLET BY MOUTH THREE TIMES A DAY   Cholecalciferol (VITAMIN D) 50 MCG (2000 UT) tablet 1 tablet   clobetasol (TEMOVATE) 0.05 % external solution APPLY TO AFFECTED AREA TWICE A DAY   EPINEPHrine 0.3 mg/0.3 mL IJ SOAJ injection Inject 0.3 mg into the muscle as needed for anaphylaxis.   estradiol (ESTRACE) 0.1 MG/GM vaginal cream Insert pea-sized amount nightly for 2 weeks then every other night   famotidine (PEPCID) 40 MG tablet Take 1 tablet (40 mg total) by mouth daily.   famotidine (PEPCID) 40 MG tablet Take 1 tablet by mouth daily.   gabapentin (NEURONTIN) 100 MG capsule Take 2 capsules (200 mg total) by mouth at bedtime.   gabapentin (NEURONTIN) 600 MG tablet Take 1 tablet (600 mg total) by mouth 3 (three) times daily.   lidocaine (LIDODERM) 5 % Apply 1 patch topically daily.   LUMIGAN 0.01 % SOLN 1 drop at bedtime.   meloxicam (MOBIC) 15 MG tablet Take 1 tablet (15 mg total)  by mouth daily.   methocarbamol (ROBAXIN) 500 MG tablet Take 1 tablet (500 mg total) by mouth every 8 (eight) hours as needed for muscle spasms.   mometasone (NASONEX) 50 MCG/ACT nasal spray Place 2 sprays into the nose daily.   nebivolol (BYSTOLIC) 5 MG tablet TAKE 1 TABLET BY MOUTH EVERY DAY   nitroGLYCERIN (NITROSTAT) 0.4 MG SL tablet Place 1 tablet (0.4 mg total) under the tongue every 5 (five) minutes as needed for chest pain.   Nitroglycerin (RECTIV) 0.4 % OINT Place 0.4 inches rectally 2 (two) times daily as needed (For anal fissures.).   ondansetron (ZOFRAN-ODT) 4 MG disintegrating tablet 1 tablet on the tongue and allow to dissolve Orally Once a day  for 30 day(s)   pantoprazole (PROTONIX) 20 MG tablet Take 1 tablet (20 mg total) by mouth daily.   pantoprazole (PROTONIX) 40 MG tablet TAKE 1 TABLET (40 MG TOTAL) BY MOUTH TWICE A DAY BEFORE MEALS   polyethylene glycol powder (GLYCOLAX/MIRALAX) 17 GM/SCOOP powder See admin instructions.   primidone (MYSOLINE) 50 MG tablet Take 1 tablet in morning and 3 tablets at bedtime.   traZODone (DESYREL) 50 MG tablet Take 1 tablet (50 mg total) by mouth at bedtime as needed for sleep.   vitamin B-12 (CYANOCOBALAMIN) 1000 MCG tablet Take 1 tablet (1,000 mcg total) by mouth daily.   Zinc 50 MG TABS 1 tablet Orally Once a day for 30 day(s)   [DISCONTINUED] primidone (MYSOLINE) 50 MG tablet TAKE 1 TABLET IN MORNING AND 3 TABLETS AT BEDTIME   No facility-administered encounter medications on file as of 03/27/2023.    Allergies (verified) Bactrim [sulfamethoxazole-trimethoprim], Bee venom, Penicillins, Dextromethorphan, Elavil [amitriptyline], Hydrocodone-acetaminophen, Oxycodone, Penicillin g, Dextromethorphan polistirex er, Fluconazole, Hydrocodone, Oxycodone-acetaminophen, and Tape   History: Past Medical History:  Diagnosis Date   Allergy    Anal fissure    Anemia    borderline   Anxiety    Arthritis    Blood clot in vein    left leg   Detached retina    BOTH SCLERA BUCKLE BOTH EYES   DVT (deep venous thrombosis) (HCC)    left leg   Endometriosis    Family history of adverse reaction to anesthesia    DAUGHTER POST OP PONV   GERD (gastroesophageal reflux disease)    Glaucoma    RIGHT WORST THAN LEFT   Heart murmur    "caused by anxiety"    History of hemorrhoids    History of kidney stones    Hypertension    IBS (irritable bowel syndrome)    Perforated eardrum, right    SMALL HOLE   PONV (postoperative nausea and vomiting)    Pulmonary embolism (HCC) 6 YRS AGO   Status post arthroscopy of left knee 08/06/2017   Past Surgical History:  Procedure Laterality Date   ABDOMINAL  HYSTERECTOMY     still with both ovaries   BREAST BIOPSY Left 12/19/2022   [place] LT BREAST BX W LOC DEV 1ST LESION IMG BX SPEC US GUIDE 12/19/2022 [facility]   CATARACT EXTRACTION Bilateral 2015   COLONOSCOPY     CYSTOSCOPY W/ URETERAL STENT PLACEMENT Right 05/23/2017   Procedure: CYSTOSCOPY WITH RETROGRADE PYELOGRAM/URETERAL RIGHT STENT PLACEMENT;  Surgeon: Alfredo Martinez, MD;  Location: [facility];  Service: Urology;  Laterality: Right;   CYSTOSCOPY W/ URETERAL STENT PLACEMENT Right 06/04/2017   Procedure: CYSTOSCOPY WITHRIGHT RETROGRADE PYELOGRAMRIGHT /URETEREOSCOPY  STENT PLACEMENT;  Surgeon: Crista Elliot, MD;  Location: [facility];  Service: Urology;  Laterality: Right;   ENDOMETRIAL ABLATION     ESOPHAGOGASTRODUODENOSCOPY (EGD) WITH PROPOFOL N/A 08/16/2022   Procedure: ESOPHAGOGASTRODUODENOSCOPY (EGD) WITH PROPOFOL;  Surgeon: Toney Reil, MD;  Location: [facility];  Service: Gastroenterology;  Laterality: N/A;   EYE SURGERY     HEMORRHOID SURGERY  2011   INCONTINENCE SURGERY     INNER EAR SURGERY     X 2   RECTAL PROLAPSE REPAIR     RETINAL DETACHMENT SURGERY Bilateral 2000   TOTAL KNEE ARTHROPLASTY Left 04/29/2019   Procedure: LEFT TOTAL KNEE ARTHROPLASTY;  Surgeon: Kathryne Hitch, MD;  Location: [facility];  Service: Orthopedics;  Laterality: Left;   TUBAL LIGATION     VEIN SURGERY Left 04/2010   Family History  Problem Relation Age of Onset   Emphysema Mother        smoker   Allergies Mother    Asthma Mother    Heart disease Mother        AMI as cause of death   COPD Mother    Asthma Father    Heart disease Father        AMI as cause of death   Diabetes Father    Asthma Brother    Heart disease Brother 56       heart failure   Lung cancer Maternal Grandfather        was a smoker   Cancer Maternal Grandfather        lung   Heart disease Paternal Grandmother        AMI   Heart disease Paternal Grandfather        AMI   Colon  cancer Neg Hx    Social History   Socioeconomic History   Marital status: Divorced    Spouse name: Not on file   Number of children: 1   Years of education: Not on file   Highest education level: Not on file  Occupational History   Occupation: IT sales professional: [employer]  Tobacco Use   Smoking status: Never   Smokeless tobacco: Never  Vaping Use   Vaping status: Never Used  Substance and Sexual Activity   Alcohol use: Not Currently    Comment: occ   Drug use: No    Comment: former maryjuana use- quit in 1995   Sexual activity: Yes    Partners: Male    Birth control/protection: Surgical  Other Topics Concern   Not on file  Social History Narrative   Marital status: divorced x 2; dating seriously x 3 years.        Children:  1 daughter (77); no grandchildren      Lives:  With boyfriend.        Employment: works at [employer]      Tobacco: none      Alcohol: rarely; socially      Exercise: none; has treadmill and elliptical and bikes   Coffee in am / 1/2 of coke in afternoon    High school education      01/29/20   From: the area   Living: alone - but boyfriend living with her Genelle Bal (2020) but knew each other in high school   Work: on disability - working on appeal      Family: one daughter Leonette Nutting - one grandchild coming in January       Enjoys: relax and watch tv, use to like fishing/boating/rafting, reading, crochet  Exercise: rehab exercises   Diet: not great - limited intake      Safety   Seat belts: Yes    Guns: Yes  and secure   Right handed   One story home   Drinks caffeine   Social Determinants of Health   Financial Resource Strain: Low Risk  (03/27/2023)   Overall Financial Resource Strain (CARDIA)    Difficulty of Paying Living Expenses: Not hard at all  Food Insecurity: No Food Insecurity (03/27/2023)   Hunger Vital Sign    Worried About Running Out of Food in the Last Year: Never true    Ran Out of Food  in the Last Year: Never true  Transportation Needs: No Transportation Needs (03/27/2023)   PRAPARE - Administrator, Civil Service (Medical): No    Lack of Transportation (Non-Medical): No  Physical Activity: Inactive (03/27/2023)   Exercise Vital Sign    Days of Exercise per Week: 0 days    Minutes of Exercise per Session: 0 min  Stress: No Stress Concern Present (03/27/2023)   [employer] of Occupational Health - Occupational Stress Questionnaire    Feeling of Stress : Not at all  Social Connections: Moderately Isolated (03/27/2023)   Social Connection and Isolation Panel [NHANES]    Frequency of Communication with Friends and Family: More than three times a week    Frequency of Social Gatherings with Friends and Family: More than three times a week    Attends Religious Services: More than 4 times per year    Active Member of [employer] or Organizations: No    Attends Engineer, structural: Never    Marital Status: Divorced    Tobacco Counseling Counseling given: Not Answered   Clinical Intake:  Pre-visit preparation completed: Yes  Pain : No/denies pain     Nutritional Risks: None Diabetes: No  How often do you need to have someone help you when you read instructions, pamphlets, or other written materials from your doctor or pharmacy?: 1 - Never  Interpreter Needed?: No  Information entered by :: Renie Ora, LPN   Activities of Daily Living    03/27/2023    2:13 PM  In your present state of health, do you have any difficulty performing the following activities:  Hearing? 0  Vision? 0  Difficulty concentrating or making decisions? 0  Walking or climbing stairs? 0  Dressing or bathing? 0  Doing errands, shopping? 0  Preparing Food and eating ? N  Using the Toilet? N  In the past six months, have you accidently leaked urine? N  Do you have problems with loss of bowel control? N  Managing your Medications? N  Managing your Finances? N   Housekeeping or managing your Housekeeping? N    Patient Care Team: Mort Sawyers, FNP as PCP - General (Family Medicine) Jake Bathe, MD as PCP - Cardiology (Cardiology) Copland, Gwenlyn Found, MD as Referring Physician (Family Medicine) Arlean Hopping, MD as Rounding Team (Internal Medicine) Drema Dallas, DO as Consulting Physician (Neurology)  Indicate any recent Medical Services you may have received from other than [facility] providers in the past year (date may be approximate).     Assessment:   This is a routine wellness examination for Ming.  Hearing/Vision screen Vision Screening - Comments:: Wears rx glasses - up to date with routine eye exams with  Dr. Louanna Raw    Goals Addressed             This  Visit's Progress    DIET - INCREASE WATER INTAKE         Depression Screen    03/27/2023    2:11 PM 01/29/2023    2:27 PM 10/11/2022   10:15 AM 08/25/2022   10:50 AM 07/11/2022   12:44 PM 03/28/2022   10:38 AM 10/12/2021   11:35 AM  PHQ 2/9 Scores  PHQ - 2 Score 0 0 3 3 2 2 4   PHQ- 9 Score   8 9   8     Fall Risk    03/27/2023    2:10 PM 01/29/2023    2:27 PM 10/24/2022    2:27 PM 10/11/2022   10:15 AM 08/25/2022   10:48 AM  Fall Risk   Falls in the past year? 0 0 0 0 0  Number falls in past yr: 0 0 0 0 0  Injury with Fall? 0 0  0 0  Risk for fall due to : No Fall Risks      Follow up Falls prevention discussed   Falls evaluation completed;Education provided;Falls prevention discussed Falls evaluation completed;Education provided;Falls prevention discussed    MEDICARE RISK AT HOME: Medicare Risk at Home Any stairs in or around the home?: No If so, are there any without handrails?: No Home free of loose throw rugs in walkways, pet beds, electrical cords, etc?: Yes Adequate lighting in your home to reduce risk of falls?: Yes Life alert?: No Use of a cane, walker or w/c?: No Grab bars in the bathroom?: Yes Shower chair or bench in shower?: Yes Elevated toilet  seat or a handicapped toilet?: Yes  TIMED UP AND GO:  Was the test performed?  No    Cognitive Function:        03/27/2023    2:13 PM  6CIT Screen  What Year? 0 points  What month? 0 points  What time? 0 points  Count back from 20 0 points  Months in reverse 0 points  Repeat phrase 0 points  Total Score 0 points    Immunizations Immunization History  Administered Date(s) Administered   Influenza Whole 03/19/2010   Influenza,inj,Quad PF,6+ Mos 03/25/2015, 04/13/2021   Influenza-Unspecified 03/19/2018   Moderna Sars-Covid-2 Vaccination 06/21/2019, 07/19/2019, 08/06/2020   Tdap 11/08/2016    TDAP status: Up to date  Flu Vaccine status: Due, Education has been provided regarding the importance of this vaccine. Advised may receive this vaccine at local pharmacy or Health Dept. Aware to provide a copy of the vaccination record if obtained from local pharmacy or Health Dept. Verbalized acceptance and understanding.  Pneumococcal vaccine status: Due, Education has been provided regarding the importance of this vaccine. Advised may receive this vaccine at local pharmacy or Health Dept. Aware to provide a copy of the vaccination record if obtained from local pharmacy or Health Dept. Verbalized acceptance and understanding.  Covid-19 vaccine status: Completed vaccines  Qualifies for Shingles Vaccine? Yes   Zostavax completed No   Shingrix Completed?: No.    Education has been provided regarding the importance of this vaccine. Patient has been advised to call insurance company to determine out of pocket expense if they have not yet received this vaccine. Advised may also receive vaccine at local pharmacy or Health Dept. Verbalized acceptance and understanding.  Screening Tests Health Maintenance  Topic Date Due   Zoster Vaccines- Shingrix (1 of 2) Never done   INFLUENZA VACCINE  01/18/2023   COVID-19 Vaccine (4 - 2023-24 season) 02/18/2023   Medicare Annual Wellness (AWV)  03/26/2024   MAMMOGRAM  11/27/2024   Colonoscopy  01/06/2026   DTaP/Tdap/Td (2 - Td or Tdap) 11/09/2026   Hepatitis C Screening  Completed   HIV Screening  Completed   HPV VACCINES  Aged Out    Health Maintenance  Health Maintenance Due  Topic Date Due   Zoster Vaccines- Shingrix (1 of 2) Never done   INFLUENZA VACCINE  01/18/2023   COVID-19 Vaccine (4 - 2023-24 season) 02/18/2023    Colorectal cancer screening: Type of screening: Colonoscopy. Completed 01/07/2016. Repeat every 10 years  Mammogram status: Completed 12/28/2022. Repeat every year  Bone Density status: Ordered not of age . Pt provided with contact info and advised to call to schedule appt.  Lung Cancer Screening: (Low Dose CT Chest recommended if Age 58-80 years, 20 pack-year currently smoking OR have quit w/in 15years.) does not qualify.   Lung Cancer Screening Referral: n/a  Additional Screening:  Hepatitis C Screening: does not qualify; Completed 04/13/2021  Vision Screening: Recommended annual ophthalmology exams for early detection of glaucoma and other disorders of the eye. Is the patient up to date with their annual eye exam?  Yes  Who is the provider or what is the name of the office in which the patient attends annual eye exams? Dr.omen  If pt is not established with a provider, would they like to be referred to a provider to establish care? No .   Dental Screening: Recommended annual dental exams for proper oral hygiene    Community Resource Referral / Chronic Care Management: CRR required this visit?  No   CCM required this visit?  No     Plan:     I have personally reviewed and noted the following in the patient's chart:   Medical and social history Use of alcohol, tobacco or illicit drugs  Current medications and supplements including opioid prescriptions. Patient is not currently taking opioid prescriptions. Functional ability and status Nutritional status Physical  activity Advanced directives List of other physicians Hospitalizations, surgeries, and ER visits in previous 12 months Vitals Screenings to include cognitive, depression, and falls Referrals and appointments  In addition, I have reviewed and discussed with patient certain preventive protocols, quality metrics, and best practice recommendations. A written personalized care plan for preventive services as well as general preventive health recommendations were provided to patient.     Lorrene Reid, LPN   16/03/[id number]   After Visit Summary: (MyChart) Due to this being a telephonic visit, the after visit summary with patients personalized plan was offered to patient via MyChart   Nurse Notes: none

## 2023-03-27 NOTE — Patient Instructions (Signed)
Ms. Eriksen , Thank you for taking time to come for your Medicare Wellness Visit. I appreciate your ongoing commitment to your health goals. Please review the following plan we discussed and let me know if I can assist you in the future.   Referrals/Orders/Follow-Ups/Clinician Recommendations: Aim for 30 minutes of exercise or brisk walking, 6-8 glasses of water, and 5 servings of fruits and vegetables each day.   This is a list of the screening recommended for you and due dates:  Health Maintenance  Topic Date Due   Zoster (Shingles) Vaccine (1 of 2) Never done   Flu Shot  01/18/2023   COVID-19 Vaccine (4 - 2023-24 season) 02/18/2023   Medicare Annual Wellness Visit  03/26/2024   Mammogram  11/27/2024   Colon Cancer Screening  01/06/2026   DTaP/Tdap/Td vaccine (2 - Td or Tdap) 11/09/2026   Hepatitis C Screening  Completed   HIV Screening  Completed   HPV Vaccine  Aged Out    Advanced directives: (Copy Requested) Please bring a copy of your health care power of attorney and living will to the office to be added to your chart at your convenience.  Next Medicare Annual Wellness Visit scheduled for next year: Yes  Insert preventive care Attachment reference

## 2023-04-10 ENCOUNTER — Ambulatory Visit: Payer: Medicare HMO | Admitting: Family

## 2023-04-10 ENCOUNTER — Encounter: Payer: Self-pay | Admitting: Family

## 2023-04-10 VITALS — BP 126/72 | HR 70 | Temp 98.2°F | Ht 67.0 in | Wt 165.0 lb

## 2023-04-10 DIAGNOSIS — L400 Psoriasis vulgaris: Secondary | ICD-10-CM | POA: Diagnosis not present

## 2023-04-10 DIAGNOSIS — S0501XA Injury of conjunctiva and corneal abrasion without foreign body, right eye, initial encounter: Secondary | ICD-10-CM

## 2023-04-10 DIAGNOSIS — F411 Generalized anxiety disorder: Secondary | ICD-10-CM | POA: Diagnosis not present

## 2023-04-10 DIAGNOSIS — Z91018 Allergy to other foods: Secondary | ICD-10-CM

## 2023-04-10 DIAGNOSIS — R002 Palpitations: Secondary | ICD-10-CM | POA: Diagnosis not present

## 2023-04-10 DIAGNOSIS — Z86718 Personal history of other venous thrombosis and embolism: Secondary | ICD-10-CM

## 2023-04-10 DIAGNOSIS — J029 Acute pharyngitis, unspecified: Secondary | ICD-10-CM

## 2023-04-10 DIAGNOSIS — Z1283 Encounter for screening for malignant neoplasm of skin: Secondary | ICD-10-CM

## 2023-04-10 LAB — POCT RAPID STREP A (OFFICE): Rapid Strep A Screen: NEGATIVE

## 2023-04-10 MED ORDER — DESONIDE 0.05 % EX CREA
TOPICAL_CREAM | Freq: Two times a day (BID) | CUTANEOUS | 0 refills | Status: AC
Start: 2023-04-10 — End: ?

## 2023-04-10 MED ORDER — ERYTHROMYCIN 5 MG/GM OP OINT
1.0000 | TOPICAL_OINTMENT | Freq: Four times a day (QID) | OPHTHALMIC | 0 refills | Status: AC
Start: 2023-04-10 — End: 2023-04-17

## 2023-04-10 NOTE — Assessment & Plan Note (Signed)
Suspected not confirmed with fluorescein.  Rx erythromycin opth ointment  If no improvement see ophthalmologist

## 2023-04-10 NOTE — Progress Notes (Signed)
Established Patient Office Visit  Subjective:      CC:  Chief Complaint  Patient presents with   Anxiety    HPI: Sharon [name] is a 57 y.o. female presenting on 04/10/2023 for Anxiety . OSA, cpap, seeing pulmonary last saw Ames Dura, 7/9  Palpitations, last seen with cardiologist Dr. Shari Prows 12/05/22 ordered 7 day zio monitor. Advised to continue bystolic 5 mg once daily. Zio with NSR average HR 73 bpm rare PAC and PVC   Essential tremor: seen by neurology, taking primidone 50 mg QAM and 150 mg night time.   IBS, last seen with GI Dr. Allegra Lai 02/07/23  Acute concerns Scratch tickle in her throat, the last three nights unable to wear her cpap because feels like the air is going down her throat and irrigating her throat. Wondering if acid reflux and or allergies. She has GERD followed by GI, she takes 40 mg pantoprazole daily with famotidine mg once daily as well.   Right eye, scratched with her dish towel. Called her ophthalmologist and he said likely abrasion and advised her to start with lubricating drops however she states improvement yesterday but today gritty sensation and red and irritated. She states blurry vision a bit due to gritty sensation.   Depression, anxiety: not currently seeing a therapist.   Social history:  Relevant past medical, surgical, family and social history reviewed and updated as indicated. Interim medical history since our last visit reviewed.  Allergies and medications reviewed and updated.  DATA REVIEWED: CHART IN EPIC     ROS: Negative unless specifically indicated above in HPI.    Current Outpatient Medications:    desonide (DESOWEN) 0.05 % cream, Apply topically 2 (two) times daily., Disp: 30 g, Rfl: 0   erythromycin ophthalmic ointment, Place 1 Application into the left eye 4 (four) times daily for 7 days., Disp: 28 g, Rfl: 0   Acetylcysteine (NAC) 600 MG CAPS, Take 600 mg by mouth 2 (two) times daily., Disp: , Rfl:     acyclovir (ZOVIRAX) 400 MG tablet, Take 1 tablet by mouth daily., Disp: , Rfl:    albuterol (VENTOLIN HFA) 108 (90 Base) MCG/ACT inhaler, INHALE 1-2 PUFFS BY MOUTH EVERY 6 HOURS AS NEEDED FOR WHEEZE OR SHORTNESS OF BREATH, Disp: 8.5 each, Rfl: 2   bimatoprost (LUMIGAN) 0.03 % ophthalmic solution, Place 1 drop into both eyes at bedtime., Disp: , Rfl:    busPIRone (BUSPAR) 5 MG tablet, TAKE 1 TABLET BY MOUTH THREE TIMES A DAY, Disp: 270 tablet, Rfl: 2   Cholecalciferol (VITAMIN D) 50 MCG (2000 UT) tablet, 1 tablet, Disp: , Rfl:    clobetasol (TEMOVATE) 0.05 % external solution, APPLY TO AFFECTED AREA TWICE A DAY, Disp: 50 mL, Rfl: 0   EPINEPHrine 0.3 mg/0.3 mL IJ SOAJ injection, Inject 0.3 mg into the muscle as needed for anaphylaxis., Disp: 1 each, Rfl: 0   estradiol (ESTRACE) 0.1 MG/GM vaginal cream, Insert pea-sized amount nightly for 2 weeks then every other night, Disp: , Rfl:    famotidine (PEPCID) 40 MG tablet, Take 1 tablet (40 mg total) by mouth daily., Disp: 90 tablet, Rfl: 1   gabapentin (NEURONTIN) 100 MG capsule, Take 2 capsules (200 mg total) by mouth at bedtime., Disp: 180 capsule, Rfl: 1   gabapentin (NEURONTIN) 600 MG tablet, Take 1 tablet (600 mg total) by mouth 3 (three) times daily., Disp: 90 tablet, Rfl: 3   lidocaine (LIDODERM) 5 %, Apply 1 patch topically daily., Disp: , Rfl:  LUMIGAN 0.01 % SOLN, 1 drop at bedtime., Disp: , Rfl:    meloxicam (MOBIC) 15 MG tablet, Take 1 tablet (15 mg total) by mouth daily., Disp: 30 tablet, Rfl: 2   methocarbamol (ROBAXIN) 500 MG tablet, Take 1 tablet (500 mg total) by mouth every 8 (eight) hours as needed for muscle spasms., Disp: 90 tablet, Rfl: 1   mometasone (NASONEX) 50 MCG/ACT nasal spray, Place 2 sprays into the nose daily., Disp: 1 each, Rfl: 12   nebivolol (BYSTOLIC) 5 MG tablet, TAKE 1 TABLET BY MOUTH EVERY DAY, Disp: 90 tablet, Rfl: 3   nitroGLYCERIN (NITROSTAT) 0.4 MG SL tablet, Place 1 tablet (0.4 mg total) under the tongue every  5 (five) minutes as needed for chest pain., Disp: 30 tablet, Rfl: 12   Nitroglycerin (RECTIV) 0.4 % OINT, Place 0.4 inches rectally 2 (two) times daily as needed (For anal fissures.)., Disp: 30 g, Rfl: 1   ondansetron (ZOFRAN-ODT) 4 MG disintegrating tablet, 1 tablet on the tongue and allow to dissolve Orally Once a day for 30 day(s), Disp: 20 tablet, Rfl: 0   pantoprazole (PROTONIX) 20 MG tablet, Take 1 tablet (20 mg total) by mouth daily., Disp: 90 tablet, Rfl: 3   pantoprazole (PROTONIX) 40 MG tablet, TAKE 1 TABLET (40 MG TOTAL) BY MOUTH TWICE A DAY BEFORE MEALS, Disp: 180 tablet, Rfl: 1   polyethylene glycol powder (GLYCOLAX/MIRALAX) 17 GM/SCOOP powder, See admin instructions., Disp: , Rfl:    primidone (MYSOLINE) 50 MG tablet, Take 1 tablet in morning and 3 tablets at bedtime., Disp: 120 tablet, Rfl: 5   traZODone (DESYREL) 50 MG tablet, Take 1 tablet (50 mg total) by mouth at bedtime as needed for sleep., Disp: 90 tablet, Rfl: 3   vitamin B-12 (CYANOCOBALAMIN) 1000 MCG tablet, Take 1 tablet (1,000 mcg total) by mouth daily., Disp: 30 tablet, Rfl: 3   Zinc 50 MG TABS, 1 tablet Orally Once a day for 30 day(s), Disp: , Rfl:       Objective:    BP 126/72 (BP Location: Left Arm, Patient Position: Sitting, Cuff Size: Normal)   Pulse 70   Temp 98.2 F (36.8 C) (Temporal)   Ht 5\' 7"  (1.702 m)   Wt 165 lb (74.8 kg)   LMP 10/11/2010   SpO2 95%   BMI 25.84 kg/m   Wt Readings from Last 3 Encounters:  04/10/23 165 lb (74.8 kg)  03/27/23 165 lb (74.8 kg)  02/07/23 160 lb (72.6 kg)    Physical Exam Constitutional:      General: She is not in acute distress.    Appearance: Normal appearance. She is normal weight. She is not ill-appearing, toxic-appearing or diaphoretic.  HENT:     Head: Normocephalic.  Eyes:     General: Lids are normal. Vision grossly intact. No allergic shiner or visual field deficit.       Right eye: No foreign body or discharge.     Extraocular Movements:  Extraocular movements intact.     Right eye: Normal extraocular motion and no nystagmus.     Conjunctiva/sclera:     Right eye: Right conjunctiva is injected.  Cardiovascular:     Rate and Rhythm: Normal rate and regular rhythm.  Pulmonary:     Effort: Pulmonary effort is normal.  Musculoskeletal:        General: Normal range of motion.  Lymphadenopathy:     Cervical: Cervical adenopathy present.     Left cervical: Superficial cervical adenopathy present.  Skin:  Comments: Right inner upper eyelid browline with small area of raised scaly lesion with mild erythema.   Right upper forehead with raised skin pigmented lesion without abn borders.   Neurological:     General: No focal deficit present.     Mental Status: She is alert and oriented to person, place, and time. Mental status is at baseline.  Psychiatric:        Mood and Affect: Mood normal.        Behavior: Behavior normal.        Thought Content: Thought content normal.        Judgment: Judgment normal.           Assessment & Plan:  Abrasion of right cornea, initial encounter Assessment & Plan: Suspected not confirmed with fluorescein.  Rx erythromycin opth ointment  If no improvement see ophthalmologist   Orders: -     Erythromycin; Place 1 Application into the left eye 4 (four) times daily for 7 days.  Dispense: 28 g; Refill: 0  Screening for malignant neoplasm of skin -     Ambulatory referral to Dermatology  Sore throat -     POCT rapid strep A  Plaque psoriasis Assessment & Plan: Small area along right browline currently Rx desonide advised to apply very lightly with q tip to area twice daily to see if any improvement. If no see dermatology.  Orders: -     Desonide; Apply topically 2 (two) times daily.  Dispense: 30 g; Refill: 0  Palpitations Assessment & Plan: Reviewed most recent cardiology visit, Zio reassuring. Pt maintained on bystolic 5 mg once daily   GAD (generalized anxiety  disorder) Assessment & Plan: Pt states she is doing ok without addition of medications at this time. Currently taking buspirone 5 mg TID. She declines therapy referral at this time.    History of DVT (deep vein thrombosis)  Multiple food allergies     Return in about 6 months (around 10/09/2023) for f/u CPE.  Mort Sawyers, MSN, APRN, FNP-C [facility]

## 2023-04-10 NOTE — Assessment & Plan Note (Signed)
Small area along right browline currently Rx desonide advised to apply very lightly with q tip to area twice daily to see if any improvement. If no see dermatology.

## 2023-04-10 NOTE — Assessment & Plan Note (Signed)
Pt states she is doing ok without addition of medications at this time. Currently taking buspirone 5 mg TID. She declines therapy referral at this time.

## 2023-04-10 NOTE — Assessment & Plan Note (Signed)
Reviewed most recent cardiology visit, Zio reassuring. Pt maintained on bystolic 5 mg once daily

## 2023-04-30 ENCOUNTER — Ambulatory Visit (INDEPENDENT_AMBULATORY_CARE_PROVIDER_SITE_OTHER): Payer: Medicare HMO | Admitting: Nurse Practitioner

## 2023-04-30 ENCOUNTER — Encounter: Payer: Self-pay | Admitting: Nurse Practitioner

## 2023-04-30 VITALS — BP 124/82 | HR 75 | Temp 99.3°F | Ht 67.0 in | Wt 164.4 lb

## 2023-04-30 DIAGNOSIS — N3090 Cystitis, unspecified without hematuria: Secondary | ICD-10-CM | POA: Insufficient documentation

## 2023-04-30 DIAGNOSIS — J029 Acute pharyngitis, unspecified: Secondary | ICD-10-CM | POA: Diagnosis not present

## 2023-04-30 DIAGNOSIS — R051 Acute cough: Secondary | ICD-10-CM | POA: Diagnosis not present

## 2023-04-30 DIAGNOSIS — R300 Dysuria: Secondary | ICD-10-CM

## 2023-04-30 DIAGNOSIS — J0100 Acute maxillary sinusitis, unspecified: Secondary | ICD-10-CM | POA: Insufficient documentation

## 2023-04-30 LAB — POCT URINALYSIS DIPSTICK
Bilirubin, UA: POSITIVE
Blood, UA: POSITIVE
Glucose, UA: NEGATIVE
Ketones, UA: NEGATIVE
Nitrite, UA: NEGATIVE
Protein, UA: POSITIVE — AB
Spec Grav, UA: 1.025 (ref 1.010–1.025)
Urobilinogen, UA: 0.2 U/dL — AB
pH, UA: 5.5 (ref 5.0–8.0)

## 2023-04-30 LAB — POC COVID19 BINAXNOW: SARS Coronavirus 2 Ag: NEGATIVE

## 2023-04-30 LAB — POCT RAPID STREP A (OFFICE): Rapid Strep A Screen: NEGATIVE

## 2023-04-30 MED ORDER — BENZONATATE 100 MG PO CAPS: 100.0000 mg | ORAL_CAPSULE | Freq: Three times a day (TID) | ORAL | 0 refills | Status: DC | PRN

## 2023-04-30 MED ORDER — CEFDINIR 300 MG PO CAPS
300.0000 mg | ORAL_CAPSULE | Freq: Two times a day (BID) | ORAL | 0 refills | Status: DC
Start: 2023-04-30 — End: 2024-04-01

## 2023-04-30 NOTE — Assessment & Plan Note (Signed)
UA in office 

## 2023-04-30 NOTE — Assessment & Plan Note (Signed)
Strep test in office.  Can continue doing warm salt water gargles and using over-the-counter analgesics as needed

## 2023-04-30 NOTE — Progress Notes (Signed)
Acute Office Visit  Subjective:     Patient ID: Sharon [name], female    DOB: 04/03/1966, 57 y.o.   MRN: [id number]  Chief Complaint  Patient presents with   Sore Throat    Pt complains of sore throat and hoarseness since 10/22. Pt states that the sore throat feels worse than last visit. Pt complains of coughing up green mucus today. Has been taking OTC to help. Vitamin C, D, and Echinacea, Thera flu packets.      Sore Throat  Associated symptoms include coughing. Pertinent negatives include no abdominal pain, ear discharge, ear pain, headaches or vomiting.   Patient is in today for multiple complaints with a history of HTN, reactive airway disea, osa, GERD, prediabetes  Symptoms started on 04/10/2023  Covid vaccine: original with booster Flu vaccine: not up to date  States that her granddaughter has been sick  States that she did start coughing stuff up. States that she is taking afrin.  She is also taking vitamin D, vitamin C, TheraFlu, and Echinacea.  Urinary: states that she is having trouble with burning, frequency, urgency, incomplete emptying of her bladder.  Patient does have a bladder sling  Review of Systems  Constitutional:  Positive for malaise/fatigue. Negative for chills and fever.  HENT:  Positive for sinus pain and sore throat. Negative for ear discharge and ear pain.   Respiratory:  Positive for cough and sputum production.   Gastrointestinal:  Negative for abdominal pain, nausea and vomiting.  Genitourinary:  Positive for dysuria, frequency and urgency. Negative for hematuria.  Musculoskeletal:  Negative for joint pain and myalgias.  Neurological:  Negative for headaches.        Objective:    BP 124/82   Pulse 75   Temp 99.3 F (37.4 C) (Oral)   Ht 5\' 7"  (1.702 m)   Wt 164 lb 6.4 oz (74.6 kg)   LMP 10/11/2010   SpO2 97%   BMI 25.75 kg/m    Physical Exam Vitals and nursing note reviewed.  Constitutional:      Appearance: Normal  appearance.  HENT:     Right Ear: Tympanic membrane, ear canal and external ear normal.     Left Ear: Tympanic membrane, ear canal and external ear normal.     Nose:     Right Sinus: Maxillary sinus tenderness present. No frontal sinus tenderness.     Left Sinus: Maxillary sinus tenderness present. No frontal sinus tenderness.     Mouth/Throat:     Mouth: Mucous membranes are moist.     Pharynx: Oropharynx is clear. No posterior oropharyngeal erythema.  Cardiovascular:     Rate and Rhythm: Normal rate and regular rhythm.     Heart sounds: Normal heart sounds.  Pulmonary:     Effort: Pulmonary effort is normal.     Breath sounds: Normal breath sounds.  Abdominal:     General: Bowel sounds are normal. There is no distension.     Palpations: There is no mass.     Tenderness: There is abdominal tenderness. There is no right CVA tenderness or left CVA tenderness.     Hernia: No hernia is present.    Lymphadenopathy:     Cervical: No cervical adenopathy.  Neurological:     Mental Status: She is alert.     Results for orders placed or performed in visit on 04/30/23  POC COVID-19  Result Value Ref Range   SARS Coronavirus 2 Ag Negative Negative  Rapid Strep A  Result Value Ref Range   Rapid Strep A Screen Negative Negative  POCT urinalysis dipstick  Result Value Ref Range   Color, UA yellow    Clarity, UA cloudy    Glucose, UA Negative Negative   Bilirubin, UA pos    Ketones, UA neg    Spec Grav, UA 1.025 1.010 - 1.025   Blood, UA pos    pH, UA 5.5 5.0 - 8.0   Protein, UA Positive (A) Negative   Urobilinogen, UA 0.2 (A) 0.2 or 1.0 E.U./dL   Nitrite, UA neg    Leukocytes, UA Moderate (2+) (A) Negative   Appearance     Odor          Assessment & Plan:   Problem List Items Addressed This Visit       Respiratory   Acute non-recurrent maxillary sinusitis    COVID and strep test negative in office.  Will treat patient with cefdinir 300 mg twice daily for a week.   Patient does have a penicillin allergy but has tolerated cephalosporins in the past.      Relevant Medications   cefdinir (OMNICEF) 300 MG capsule   benzonatate (TESSALON) 100 MG capsule     Genitourinary   Cystitis    UA positive send off urine culture treat with cefdinir for dual coverage      Relevant Medications   cefdinir (OMNICEF) 300 MG capsule   Other Relevant Orders   Urine Culture     Other   Acute cough    COVID test in office.  Will send in Tessalon Perles 100 mg 3 times daily as needed.      Relevant Orders   POC COVID-19 (Completed)   Dysuria    UA in office      Relevant Orders   POCT urinalysis dipstick (Completed)   Sore throat - Primary    Strep test in office.  Can continue doing warm salt water gargles and using over-the-counter analgesics as needed      Relevant Orders   POC COVID-19 (Completed)   Rapid Strep A (Completed)    Meds ordered this encounter  Medications   cefdinir (OMNICEF) 300 MG capsule    Sig: Take 1 capsule (300 mg total) by mouth 2 (two) times daily.    Dispense:  14 capsule    Refill:  0    Order Specific Question:   Supervising Provider    Answer:   Milinda Antis MARNE A [[id number]]   benzonatate (TESSALON) 100 MG capsule    Sig: Take 1 capsule (100 mg total) by mouth 3 (three) times daily as needed.    Dispense:  21 capsule    Refill:  0    Order Specific Question:   Supervising Provider    Answer:   TOWER, MARNE A [[id number]]    Return if symptoms worsen or fail to improve.  Audria Nine, NP

## 2023-04-30 NOTE — Assessment & Plan Note (Signed)
COVID test in office.  Will send in Tessalon Perles 100 mg 3 times daily as needed.

## 2023-04-30 NOTE — Patient Instructions (Addendum)
Nice to see you today I have sent in an antibiotic and cough medication to the pharmacy  Follow up if you do not start improving with the medication  Covid and strep test was negative in office

## 2023-04-30 NOTE — Assessment & Plan Note (Signed)
UA positive send off urine culture treat with cefdinir for dual coverage

## 2023-04-30 NOTE — Assessment & Plan Note (Signed)
COVID and strep test negative in office.  Will treat patient with cefdinir 300 mg twice daily for a week.  Patient does have a penicillin allergy but has tolerated cephalosporins in the past.

## 2023-05-01 ENCOUNTER — Encounter: Payer: Medicare HMO | Admitting: Physical Medicine and Rehabilitation

## 2023-05-02 LAB — URINE CULTURE
MICRO NUMBER:: 15712880
SPECIMEN QUALITY:: ADEQUATE

## 2023-05-19 DIAGNOSIS — G4733 Obstructive sleep apnea (adult) (pediatric): Secondary | ICD-10-CM | POA: Diagnosis not present

## 2023-05-23 ENCOUNTER — Encounter: Payer: Self-pay | Admitting: Dermatology

## 2023-05-23 ENCOUNTER — Ambulatory Visit: Payer: Medicare HMO | Admitting: Dermatology

## 2023-05-23 DIAGNOSIS — L918 Other hypertrophic disorders of the skin: Secondary | ICD-10-CM

## 2023-05-23 DIAGNOSIS — L814 Other melanin hyperpigmentation: Secondary | ICD-10-CM | POA: Diagnosis not present

## 2023-05-23 DIAGNOSIS — D1801 Hemangioma of skin and subcutaneous tissue: Secondary | ICD-10-CM | POA: Diagnosis not present

## 2023-05-23 DIAGNOSIS — D2371 Other benign neoplasm of skin of right lower limb, including hip: Secondary | ICD-10-CM

## 2023-05-23 DIAGNOSIS — L578 Other skin changes due to chronic exposure to nonionizing radiation: Secondary | ICD-10-CM

## 2023-05-23 DIAGNOSIS — Z1283 Encounter for screening for malignant neoplasm of skin: Secondary | ICD-10-CM

## 2023-05-23 DIAGNOSIS — W908XXA Exposure to other nonionizing radiation, initial encounter: Secondary | ICD-10-CM | POA: Diagnosis not present

## 2023-05-23 DIAGNOSIS — D229 Melanocytic nevi, unspecified: Secondary | ICD-10-CM

## 2023-05-23 DIAGNOSIS — L219 Seborrheic dermatitis, unspecified: Secondary | ICD-10-CM

## 2023-05-23 DIAGNOSIS — L821 Other seborrheic keratosis: Secondary | ICD-10-CM | POA: Diagnosis not present

## 2023-05-23 DIAGNOSIS — L309 Dermatitis, unspecified: Secondary | ICD-10-CM | POA: Diagnosis not present

## 2023-05-23 DIAGNOSIS — D239 Other benign neoplasm of skin, unspecified: Secondary | ICD-10-CM

## 2023-05-23 MED ORDER — TRIAMCINOLONE ACETONIDE 0.1 % EX CREA: TOPICAL_CREAM | CUTANEOUS | 3 refills | Status: AC

## 2023-05-23 NOTE — Patient Instructions (Addendum)
Hand Dermatitis is a chronic type of eczema that can come and go on the hands and fingers.  While there is no cure, the rash and symptoms can be managed with topical prescription medications, and for more severe cases, with systemic medications.  Recommend mild soap and routine use of moisturizing cream after handwashing.  Minimize soap/water exposure when possible. Start triamcinolone 0.1% cream - apply to affected areas hands twice daily until improved, then as needed for recurrence. Avoid applying to face, groin, and axilla. Use as directed. Long-term use can cause thinning of the skin.   Seborrheic Keratosis  What causes seborrheic keratoses? Seborrheic keratoses are harmless, common skin growths that first appear during adult life.  As time goes by, more growths appear.  Some people may develop a large number of them.  Seborrheic keratoses appear on both covered and uncovered body parts.  They are not caused by sunlight.  The tendency to develop seborrheic keratoses can be inherited.  They vary in color from skin-colored to gray, brown, or even black.  They can be either smooth or have a rough, warty surface.   Seborrheic keratoses are superficial and look as if they were stuck on the skin.  Under the microscope this type of keratosis looks like layers upon layers of skin.  That is why at times the top layer may seem to fall off, but the rest of the growth remains and re-grows.    Treatment Seborrheic keratoses do not need to be treated, but can easily be removed in the office.  Seborrheic keratoses often cause symptoms when they rub on clothing or jewelry.  Lesions can be in the way of shaving.  If they become inflamed, they can cause itching, soreness, or burning.  Removal of a seborrheic keratosis can be accomplished by freezing, burning, or surgery. If any spot bleeds, scabs, or grows rapidly, please return to have it checked, as these can be an indication of a skin cancer.  Due to recent  changes in healthcare laws, you may see results of your pathology and/or laboratory studies on MyChart before the doctors have had a chance to review them. We understand that in some cases there may be results that are confusing or concerning to you. Please understand that not all results are received at the same time and often the doctors may need to interpret multiple results in order to provide you with the best plan of care or course of treatment. Therefore, we ask that you please give [place] 2 business days to thoroughly review all your results before contacting the office for clarification. Should we see a critical lab result, you will be contacted sooner.   If You Need Anything After Your Visit  If you have any questions or concerns for your doctor, please call our main line at [phone] and press option 4 to reach your doctor's medical assistant. If no one answers, please leave a voicemail as directed and we will return your call as soon as possible. Messages left after 4 pm will be answered the following business day.   You may also send [place] a message via MyChart. We typically respond to MyChart messages within 1-2 business days.  For prescription refills, please ask your pharmacy to contact our office. Our fax number is [phone].  If you have an urgent issue when the clinic is closed that cannot wait until the next business day, you can page your doctor at the number below.    Please note that while  we do our best to be available for urgent issues outside of office hours, we are not available 24/7.   If you have an urgent issue and are unable to reach [place], you may choose to seek medical care at your doctor's office, retail clinic, urgent care center, or emergency room.  If you have a medical emergency, please immediately call 911 or go to the emergency department.  Pager Numbers  - Dr. Gwen Pounds: [phone]  - Dr. Roseanne Reno: [phone]  - Dr. Katrinka Blazing: [phone]   In the event  of inclement weather, please call our main line at [phone] for an update on the status of any delays or closures.  Dermatology Medication Tips: Please keep the boxes that topical medications come in in order to help keep track of the instructions about where and how to use these. Pharmacies typically print the medication instructions only on the boxes and not directly on the medication tubes.   If your medication is too expensive, please contact our office at [phone] option 4 or send [place] a message through MyChart.   We are unable to tell what your co-pay for medications will be in advance as this is different depending on your insurance coverage. However, we may be able to find a substitute medication at lower cost or fill out paperwork to get insurance to cover a needed medication.   If a prior authorization is required to get your medication covered by your insurance company, please allow [place] 1-2 business days to complete this process.  Drug prices often vary depending on where the prescription is filled and some pharmacies may offer cheaper prices.  The website www.goodrx.com contains coupons for medications through different pharmacies. The prices here do not account for what the cost may be with help from insurance (it may be cheaper with your insurance), but the website can give you the price if you did not use any insurance.  - You can print the associated coupon and take it with your prescription to the pharmacy.  - You may also stop by our office during regular business hours and pick up a GoodRx coupon card.  - If you need your prescription sent electronically to a different pharmacy, notify our office through [facility] or by phone at [phone] option 4.     Si Usted Necesita Algo Despus de Su Visita  Tambin puede enviarnos un mensaje a travs de Clinical cytogeneticist. Por lo general respondemos a los mensajes de MyChart en el transcurso de 1 a 2 das hbiles.  Para  renovar recetas, por favor pida a su farmacia que se ponga en contacto con nuestra oficina. Annie Sable de fax es [place] [phone].  Si tiene un asunto urgente cuando la clnica est cerrada y que no puede esperar hasta el siguiente da hbil, puede llamar/localizar a su doctor(a) al nmero que aparece a continuacin.   Por favor, tenga en cuenta que aunque hacemos todo lo posible para estar disponibles para asuntos urgentes fuera del horario de [place], no estamos disponibles las 24 horas del da, los 7 [address]  [address] de la [place].   Si tiene un problema urgente y no puede comunicarse con nosotros, puede optar por buscar atencin mdica  en el consultorio de su doctor(a), en una clnica privada, en un centro de atencin urgente o en una sala de emergencias.  Si tiene Engineer, drilling, por favor llame inmediatamente al 911 o vaya a la sala de emergencias.  Nmeros de bper  - Dr. Gwen Pounds: [phone]  -  Rockne Menghini: [phone]  - Dr. Katrinka Blazing: [phone]   En caso de inclemencias del tiempo, por favor llame a Lacy Duverney principal al [phone] para una actualizacin sobre el [place] de cualquier retraso o cierre.  Consejos para la medicacin en dermatologa: Por favor, guarde las cajas en las que vienen los medicamentos de uso tpico para ayudarle a seguir las instrucciones sobre dnde y cmo usarlos. Las farmacias generalmente imprimen las instrucciones del medicamento slo en las cajas y no directamente en los tubos del [place].   Si su medicamento es muy caro, por favor, pngase en contacto con Rolm Gala llamando al [phone] y presione la opcin 4 o envenos un mensaje a travs de Clinical cytogeneticist.   No podemos decirle cul ser su copago por los medicamentos por adelantado ya que esto es diferente dependiendo de la cobertura de su seguro. Sin embargo, es posible que podamos encontrar un medicamento sustituto a Audiological scientist un formulario para que el seguro cubra el  medicamento que se considera necesario.   Si se requiere una autorizacin previa para que su compaa de seguros [place] su medicamento, por favor permtanos de 1 a 2 das hbiles para completar [address]  Los precios de los medicamentos varan con frecuencia dependiendo del Environmental consultant de dnde se surte la receta y alguna farmacias pueden ofrecer precios ms baratos.  El sitio web www.goodrx.com tiene cupones para medicamentos de Health and safety inspector. Los precios aqu no tienen en cuenta lo que podra costar con la ayuda del seguro (puede ser ms barato con su seguro), pero el sitio web puede darle el precio si no utiliz Tourist information centre manager.  - Puede imprimir el cupn correspondiente y llevarlo con su receta a la farmacia.  - Tambin puede pasar por nuestra oficina durante el horario de atencin regular y Education officer, museum una tarjeta de cupones de GoodRx.  - Si necesita que su receta se enve electrnicamente a una farmacia diferente, informe a nuestra oficina a travs de MyChart de [place] o por telfono llamando al [phone] y presione la opcin 4.

## 2023-05-23 NOTE — Progress Notes (Signed)
New Patient Visit   Subjective  Sharon [name] is a 57 y.o. female who presents for the following: Skin Cancer Screening and Full Body Skin Exam  The patient presents for Total-Body Skin Exam (TBSE) for skin cancer screening and mole check. The patient has spots, moles and lesions to be evaluated, some may be new or changing.  She has a few spots on the face she would like checked, picks at. Hands are very dry and they crack. No history of skin cancer or dysplastic nevi.   The following portions of the chart were reviewed this encounter and updated as appropriate: medications, allergies, medical history  Review of Systems:  No other skin or systemic complaints except as noted in HPI or Assessment and Plan.  Objective  Well appearing patient in no apparent distress; mood and affect are within normal limits.  A full examination was performed including scalp, head, eyes, ears, nose, lips, neck, chest, axillae, abdomen, back, buttocks, bilateral upper extremities, bilateral lower extremities, hands, feet, fingers, toes, fingernails, and toenails. All findings within normal limits unless otherwise noted below.   Relevant physical exam findings are noted in the Assessment and Plan.    Assessment & Plan   SKIN CANCER SCREENING PERFORMED TODAY.  ACTINIC DAMAGE - Chronic condition, secondary to cumulative UV/sun exposure - diffuse scaly erythematous macules with underlying dyspigmentation - Recommend daily broad spectrum sunscreen SPF 30+ to sun-exposed areas, reapply every 2 hours as needed.  - Staying in the shade or wearing long sleeves, sun glasses (UVA+UVB protection) and wide brim hats (4-inch brim around the entire circumference of the hat) are also recommended for sun protection.  - Call for new or changing lesions.  LENTIGINES, HEMANGIOMAS - Benign normal skin lesions - Benign-appearing - Call for any changes  SEBORRHEIC KERATOSIS - Stuck-on, waxy, tan-brown papules  and/or plaques, including face, right lower leg  - Benign-appearing - Discussed benign etiology and prognosis. - Observe - Call for any changes  DERMATOFIBROMA Exam: Firm pink/brown papulenodule with dimple sign at the right anterior ankle  Treatment Plan: A dermatofibroma is a benign growth possibly related to trauma, such as an insect bite, cut from shaving, or inflamed acne-type bump.  Treatment options to remove include shave or excision with resulting scar and risk of recurrence.  Since benign-appearing and not bothersome, will observe for now.   MELANOCYTIC NEVI - Tan-brown and/or pink-flesh-colored symmetric macules and papules - Benign appearing on exam today - Observation - Call clinic for new or changing moles - Recommend daily use of broad spectrum spf 30+ sunscreen to sun-exposed areas.   HAND DERMATITIS Exam Xerotic scaly fissured plaques of palmar hands and fingers  Chronic and persistent condition with duration or expected duration over one year. Condition is bothersome/symptomatic for patient. Currently flared.  Hand Dermatitis is a chronic type of eczema that can come and go on the hands and fingers.  While there is no cure, the rash and symptoms can be managed with topical prescription medications, and for more severe cases, with systemic medications.  Recommend mild soap and routine use of moisturizing cream after handwashing.  Minimize soap/water exposure when possible.    Treatment Plan  Start triamcinolone 0.1% cream Apply to hands twice a day until improved, then as needed for recurrence dsp 45g 3Rf. Avoid applying to face, groin, and axilla. Use as directed. Long-term use can cause thinning of the skin.   Acrochordons (Skin Tags) - Fleshy, skin-colored pedunculated papules - Benign appearing.  -  Observe. - If desired, they can be removed with an in office procedure that is not covered by insurance. - Please call the clinic if you notice any new or  changing lesions.  SEBORRHEIC DERMATITIS Exam: mild diffuse white scaling of scalp  Chronic and persistent condition with duration or expected duration over one year. Condition is symptomatic/ bothersome to patient. Not currently at goal.   Seborrheic Dermatitis is a chronic persistent rash characterized by pinkness and scaling most commonly of the mid face but also can occur on the scalp (dandruff), ears; mid chest, mid back and groin.  It tends to be exacerbated by stress and cooler weather.  People who have neurologic disease may experience new onset or exacerbation of existing seborrheic dermatitis.  The condition is not curable but treatable and can be controlled.  Treatment Plan: Continue T-Gel, T-Sal shampoos alternating.   Multiple benign nevi  Lentigines  Seborrheic keratoses  Cherry angioma  Actinic elastosis  Hand eczema  Dermatofibroma  Acrochordon  Seborrheic dermatitis    Return 1-2 years, for TBSE.  Wendee Beavers, CMA, am acting as scribe for Elie Goody, MD .   Documentation: I have reviewed the above documentation for accuracy and completeness, and I agree with the above.  Elie Goody, MD

## 2023-06-04 ENCOUNTER — Ambulatory Visit: Payer: Medicare HMO | Attending: Cardiology | Admitting: Cardiology

## 2023-06-04 VITALS — BP 118/62 | HR 88 | Ht 67.0 in | Wt 165.6 lb

## 2023-06-04 DIAGNOSIS — R002 Palpitations: Secondary | ICD-10-CM

## 2023-06-04 DIAGNOSIS — I10 Essential (primary) hypertension: Secondary | ICD-10-CM | POA: Diagnosis not present

## 2023-06-04 NOTE — Progress Notes (Signed)
Cardiology Office Note:  .   Date:  06/04/2023  ID:  Sharon [name], DOB Mar 30, 1966, MRN [id number] PCP: Mort Sawyers, FNP  [facility] Providers Cardiologist:  Donato Schultz, MD     History of Present Illness: .   Sharon [name] is a 57 y.o. female Discussed with the use of AI scribe   History of Present Illness   The patient is a 57 year old female with a history of palpitations, mitral valve prolapse without mitral regurgitation, and a family history of coronary artery disease. She reports continued palpitations, which she describes as "skipped beats" that can become rapid and last for a while. She has attempted to reduce her caffeine intake, limiting herself to three or four cups of coffee per week, and has ceased soda consumption. She also reports being prediabetic and has made dietary adjustments accordingly, including reducing her sugar intake.  The patient has a history of knee replacement surgery, which has led to mobility issues and back pain due to a bulging disc and a cyst with a pinched nerve. She reports difficulty with squatting, climbing stairs, and walking, which has limited her activities and caused her distress. She has been advised to consider further surgery but has been cautioned about potential worsening of her condition.  The patient also reports issues with acid reflux and has been managing this with medication, though she tries to limit her use due to potential side effects. She has experienced weight fluctuations, having lost weight due to adverse reactions to oxycodone and hydrocodone, and subsequently gaining weight. She is currently overweight and is trying to manage her health through regular doctor visits and dietary adjustments.  The patient also has sleep apnea and uses a CPAP machine, which has improved her sleep quality. She reports waking up feeling rested, though she does wake up once or twice a night to use the bathroom. 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           Studies Reviewed: .        Results   LABS Calcium score: 0  RADIOLOGY CT scan: No coronary artery disease  DIAGNOSTIC Echocardiogram: Minimal mitral valve prolapse without mitral regurgitation Cardiac monitor: Sinus rhythm, 73 bpm with brief atrial tachycardia, rare PACs and PVCs, no atrial fibrillation     Risk Assessment/Calculations:            Physical Exam:   VS:  BP 118/62   Pulse 88   Ht 5\' 7"  (1.702 m)   Wt 165 lb 9.6 oz (75.1 kg)   LMP 10/11/2010   SpO2 97%   BMI 25.94 kg/m    Wt Readings from Last 3 Encounters:  06/04/23 165 lb 9.6 oz (75.1 kg)  04/30/23 164 lb 6.4 oz (74.6 kg)  04/10/23 165 lb (74.8 kg)    GEN: Well nourished, well developed in no acute distress NECK: No JVD; No carotid bruits CARDIAC: RRR, no murmurs, no rubs, no gallops RESPIRATORY:  Clear to auscultation without rales, wheezing or rhonchi  ABDOMEN: Soft, non-tender, non-distended EXTREMITIES:  No edema; No deformity   ASSESSMENT AND PLAN: .    Assessment and Plan    Palpitations Ongoing palpitations with episodes of rapid heartbeats and skipped beats. CT scan showed no coronary artery disease, calcium score zero. Echocardiogram revealed minimal mitral valve prolapse without mitral regurgitation. Monitor showed sinus rhythm with brief atrial tachycardia, rare PACs, and PVCs, no atrial  fibrillation. Likely benign, not a precursor to myocardial infarction. Caffeine intake may contribute to symptoms. - Continue nebivolol (Bystolic) as prescribed - Advise reducing caffeine intake - Follow-up if symptoms worsen  Hypertension Blood pressure well-controlled on current medication regimen. History of fluctuating blood pressure, particularly under stress. Current medication effective. - Continue current antihypertensive medication - Monitor blood pressure  regularly  Pre-diabetes Managing pre-diabetes with dietary modifications. Holidays have made it challenging. Reduced sugar intake and mindful of diet. Importance of continued dietary modifications discussed. - Continue dietary modifications to reduce sugar intake - Monitor blood glucose levels  Gastroesophageal Reflux Disease (GERD) On acid reflux medication. Manages condition by adjusting dosage and switching medications as needed. Discussed risks of long-term medication use and importance of managing symptoms. - Continue current acid reflux medication - Adjust dosage as needed - Consider alternative medications if symptoms persist  Sleep Apnea Uses CPAP machine, reports improved sleep quality and reduced daytime fatigue. Continued use of CPAP discussed. - Continue using CPAP machine as prescribed  Chronic Pain and Mobility Issues Knee replacement, bulging disc, cyst, and pinched nerve leading to chronic pain and mobility issues. On meloxicam for inflammation but experiences side effects with prolonged use. Discussed using meloxicam as needed and considering alternative pain management strategies. - Use meloxicam as needed for inflammation - Consider alternative pain management strategies if side effects persist  General Health Maintenance Efforts to maintain a healthy lifestyle, including dietary modifications and regular follow-ups with healthcare providers. Importance of continued healthy eating habits and regular follow-ups discussed. - Continue healthy eating habits - Follow up with primary care physician as needed  Follow-up - As needed follow-up with cardiology - Primary care physician to monitor overall health.             Signed, Donato Schultz, MD

## 2023-06-04 NOTE — Patient Instructions (Signed)
Medication Instructions:  The current medical regimen is effective;  continue present plan and medications.  *If you need a refill on your cardiac medications before your next appointment, please call your pharmacy*  Follow-Up: At [facility], you and your health needs are our priority.  As part of our continuing mission to provide you with exceptional heart care, we have created designated Provider Care Teams.  These Care Teams include your primary Cardiologist (physician) and Advanced Practice Providers (APPs -  Physician Assistants and Nurse Practitioners) who all work together to provide you with the care you need, when you need it.  We recommend signing up for the patient portal called "MyChart".  Sign up information is provided on this After Visit Summary.  MyChart is used to connect with patients for Virtual Visits (Telemedicine).  Patients are able to view lab/test results, encounter notes, upcoming appointments, etc.  Non-urgent messages can be sent to your provider as well.   To learn more about what you can do with MyChart, go to [email or web address].    Your next appointment:   Follow up as needed with Dr Skains   

## 2023-06-11 ENCOUNTER — Encounter: Payer: Medicare HMO | Attending: Physical Medicine and Rehabilitation | Admitting: Physical Medicine and Rehabilitation

## 2023-06-11 VITALS — BP 141/86 | HR 76 | Ht 67.0 in | Wt 168.2 lb

## 2023-06-11 DIAGNOSIS — R7303 Prediabetes: Secondary | ICD-10-CM | POA: Insufficient documentation

## 2023-06-11 DIAGNOSIS — M5440 Lumbago with sciatica, unspecified side: Secondary | ICD-10-CM | POA: Insufficient documentation

## 2023-06-11 DIAGNOSIS — M25561 Pain in right knee: Secondary | ICD-10-CM | POA: Diagnosis not present

## 2023-06-11 DIAGNOSIS — M1990 Unspecified osteoarthritis, unspecified site: Secondary | ICD-10-CM | POA: Diagnosis not present

## 2023-06-11 DIAGNOSIS — G8929 Other chronic pain: Secondary | ICD-10-CM | POA: Diagnosis not present

## 2023-06-11 DIAGNOSIS — F411 Generalized anxiety disorder: Secondary | ICD-10-CM | POA: Diagnosis not present

## 2023-06-11 DIAGNOSIS — M25562 Pain in left knee: Secondary | ICD-10-CM | POA: Diagnosis not present

## 2023-06-11 MED ORDER — MELOXICAM 15 MG PO TABS: 15.0000 mg | ORAL_TABLET | Freq: Every day | ORAL | 2 refills | Status: DC

## 2023-06-11 MED ORDER — METHOCARBAMOL 500 MG PO TABS: 500.0000 mg | ORAL_TABLET | Freq: Three times a day (TID) | ORAL | 1 refills | Status: DC | PRN

## 2023-06-11 NOTE — Progress Notes (Signed)
Subjective:    Patient ID: Sharon [name], female    DOB: [birth year], 57 y.o.   MRN: [id number]  HPI    Sharon [name] is a 57 year old woman who presents for f/u insomnia, bilateral knee pain following left knee replacement in 04/2019, transaminitis, shortness of breath, brain fog, and prediabetes.   1) Bilateral knee pain L>R: -right knee pain is getting worse -taking meloxicam as needed, she took it for three weeks and it caused breathing issues.  -has not tried tens unit before, her boy friend has one -pain was worse after changing her bed sheets -getting a new bike and she is hoping that strengthening her muscles will help -went to church a couple times but it hurt so badly. -right knee pain has been getting worse -Her ROM is still restricted in her left knee.  -ice helps -radiates between hip and left knee Feels like a charley horse -She asks whether the scar tissue will ever heal.  -Cannot sleep without a pillow underneath her legs.  -She has follow-up with Dr. Turner Daniels and Dr. Magnus Ivan -Currently is not planning to pursue surgery.  -She has been off hydrocodone.  -She would be interested in increasing the Gabapentin dose.  -She would like to discuss an option for inflammation. She has tried aspirations but the fluid comes back right away.  -She got a  Steroid injection and experienced worsening range of motion in her left knee- that was from Dr. Turner Daniels. He also pulled fluid off. It does help with pain, but she does not want another given her worsening range of motion afterward. She does have improved sensation in her knee after the injection! -has been hurting -discussed Qutenza as a treatment option -her current regimen is working for her.  -has been having digestive issues and stopped meloxicam.  -pain has been severe lately -she has decreased her robaxin to daily but this has not improved her dry mouth  2) Anxiety: This has much improved with the Gabapentin. 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  -she has used blue emu oil and diclofenac gel without great benefit -has appointment with physician today.  -she does use her hands a lot.  -she has been dropping things recently.   9) lower back pain -has gotten worse -got Xrs and was shown to have cyst on right side and bulging disc on left side and pinched nerve in the middle.  -she feels a constant burning -worst on left side -feels she is walking crooked -she had an injection by Dr. Modesto Charon at orthopedics but these did not help.  -she know has a bone further up that is bothering her.  -left pain in her boy friend's car so had a hard time getting in today -hurts severely sometimes -pinches when she walks -sitting also hurts -she does not have insurance right now -ice helps -she uses heat for the muscular pain -hurt it while bending and turning -has been worse since she carried her grandchild -she is unable to vacuum.  -discussed therapy and using lidocaine patch, massage.  -she is ok using gabapentin  and her muscle relaxers.  -found lidocaine patch to be a lifesaver while she was at the beach.  -ready to try Qutenza today -has been severe recently -plans to follow-up with her orthopedic surgeon.   10) Constipation -has been needing to take laxatives.   11) shortness of breath -worried abut blot clots -she asks about results of her vascular ultrasound of her lower extremity and what treatment is necessary, how she would know if there are clots elsewhere in her body, and how she may have developed these clots.  -she  has being set up for tests  12) impaired memory -has improved since stopping klonopin/reduced added sugars in diet  13) Prediabetes -she asks about HgbA1c test result  14) Transaminitis -she asks whether any of her medications could have caused this'  15) Food allergy -she asks about her food allergy results  16) Insomnia: -asks if there is something else she can try for her insomnia -she has been trying to wean off her Klonopin but this makes it harder for her to fall asleep -the Requip caused dry mouth and she is hesitant about its potential side effects -she has not tried tart cherry juice or melatonin  17) Chest pain -has been radiating to left arm -she follows with a cardiologist Prior history:  Since last visit she has stopped Norco as it caused respiratory distress. We tried Cymbalta 40mg  without benefit or side effects. She has tried voltaren gel, blue emu oil without benefit. She has tried CBD.    She has been trying to improve quadriceps musculature. She discusses some of the exercises she does.   She has been denied in her disability. She has talked to an attorney.  She continues to ambulate with a cane.   She has tried the compounding cream and that does not help much. She has been trying Hemp cream which is helping.  She is getting foot insoles from the podiatrist. They should be ready in a week or two.   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   Can ride the stationary bicycle and that does not hurt. She sues it every day. She uses leg weights. When   Scar tissue grew back quickly post-surgery. She could not get PT appointments for 3 weeks.   Her surgeon does not want her to break the hardware and he may have to do arthroscopic procedure to flush out the inflammation.   She has tried ice which stopped helping, nabumetone 750 mg BID, Norco 1-2 tablets  three times per day PRN (she has 12 left). She uses diclofenac gel   She takes Miralax for constipation. She takes up to 2 times per day. She has a history of IBS.   [facility] in [place]- she was a Scientist, physiological. They let her go because she did not return in 12 weeks.   She was doing pool therapy but chemicals in the pool were increased because of COVID.  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 Notes reviewed- recently had a right subacromial injection.   Current pain is 8/10.   12) Hip pain -pain is best in the water  13) Stress: -it was stressful for her to help plan her daughter's baby shower  14) Anxiety: -her boy friend is talking about leaving again -she has been having a lot of anxiety about this   Pain  Inventory Average Pain 8 Pain Right Now 8 My pain is constant, burning, dull, stabbing, aching, and cramps  In the last 24 hours, has pain interfered with the following? General activity 8 Relation with others 10 Enjoyment of life 9 What TIME of day is your pain at its worst? evening, daytime Sleep (in general) Fair  Pain is worse with: walking, bending, sitting, inactivity, and standing, some activities Pain improves with: rest, heat/ice, and medication Relief from Meds: 6     Family History  Problem Relation Age of Onset   Emphysema Mother        smoker   Allergies Mother    Asthma Mother    Heart disease Mother        AMI as cause of death   COPD Mother    Asthma Father    Heart disease Father        AMI as cause of death   Diabetes Father    Asthma Brother    Heart disease Brother 82       heart failure   Lung cancer Maternal Grandfather        was a smoker   Cancer Maternal Grandfather        lung   Heart disease Paternal Grandmother        AMI   Heart disease Paternal Grandfather        AMI   Colon cancer Neg Hx    Social History   Socioeconomic History   Marital status: Divorced    Spouse name: Not on file   Number  of children: 1   Years of education: Not on file   Highest education level: Not on file  Occupational History   Occupation: IT sales professional: [employer]  Tobacco Use   Smoking status: Never   Smokeless tobacco: Never  Vaping Use   Vaping status: Never Used  Substance and Sexual Activity   Alcohol use: Not Currently    Comment: occ   Drug use: No    Comment: former maryjuana use- quit in 1995   Sexual activity: Yes    Partners: Male    Birth control/protection: Surgical  Other Topics Concern   Not on file  Social History Narrative   Marital status: divorced x 2; dating seriously x 3 years.        Children:  1 daughter (65); no grandchildren      Lives:  With boyfriend.        Employment: works at [employer]      Tobacco: none      Alcohol: rarely; socially      Exercise: none; has treadmill and elliptical and bikes   Coffee in am / 1/2 of coke in afternoon    High school education      01/29/20   From: the area   Living: alone - but boyfriend living with her Sharon [name] (2020) but knew each other in high school   Work: on disability - working on appeal      Family: one daughter Leonette Nutting - one grandchild coming in January       Enjoys: relax and watch tv, use to like fishing/boating/rafting, reading, crochet       Exercise: rehab exercises   Diet: not great - limited intake      Safety   Seat belts: Yes    Guns: Yes  and secure   Right handed   One story home  Drinks caffeine   Social Drivers of Corporate investment banker Strain: Low Risk  (03/27/2023)   Overall Financial Resource Strain (CARDIA)    Difficulty of Paying Living Expenses: Not hard at all  Food Insecurity: No Food Insecurity (03/27/2023)   Hunger Vital Sign    Worried About Running Out of Food in the Last Year: Never true    Ran Out of Food in the Last Year: Never true  Transportation Needs: No Transportation Needs (03/27/2023)   PRAPARE - Scientist, research (physical sciences) (Medical): No    Lack of Transportation (Non-Medical): No  Physical Activity: Inactive (03/27/2023)   Exercise Vital Sign    Days of Exercise per Week: 0 days    Minutes of Exercise per Session: 0 min  Stress: No Stress Concern Present (03/27/2023)   [employer] of Occupational Health - Occupational Stress Questionnaire    Feeling of Stress : Not at all  Social Connections: Moderately Isolated (03/27/2023)   Social Connection and Isolation Panel [NHANES]    Frequency of Communication with Friends and Family: More than three times a week    Frequency of Social Gatherings with Friends and Family: More than three times a week    Attends Religious Services: More than 4 times per year    Active Member of [employer] or Organizations: No    Attends Engineer, structural: Never    Marital Status: Divorced   Past Surgical History:  Procedure Laterality Date   ABDOMINAL HYSTERECTOMY     still with both ovaries   BREAST BIOPSY Left 12/19/2022   [place] LT BREAST BX W LOC DEV 1ST LESION IMG BX SPEC US GUIDE 12/19/2022 [facility]   CATARACT EXTRACTION Bilateral 2015   COLONOSCOPY     CYSTOSCOPY W/ URETERAL STENT PLACEMENT Right 05/23/2017   Procedure: CYSTOSCOPY WITH RETROGRADE PYELOGRAM/URETERAL RIGHT STENT PLACEMENT;  Surgeon: Alfredo Martinez, MD;  Location: [facility];  Service: Urology;  Laterality: Right;   CYSTOSCOPY W/ URETERAL STENT PLACEMENT Right 06/04/2017   Procedure: CYSTOSCOPY WITHRIGHT RETROGRADE PYELOGRAMRIGHT /URETEREOSCOPY  STENT PLACEMENT;  Surgeon: Crista Elliot, MD;  Location: [facility];  Service: Urology;  Laterality: Right;   ENDOMETRIAL ABLATION     ESOPHAGOGASTRODUODENOSCOPY (EGD) WITH PROPOFOL N/A 08/16/2022   Procedure: ESOPHAGOGASTRODUODENOSCOPY (EGD) WITH PROPOFOL;  Surgeon: Toney Reil, MD;  Location[facility];  Service: Gastroenterology;  Laterality: N/A;   EYE SURGERY     HEMORRHOID SURGERY  2011   INCONTINENCE  SURGERY     INNER EAR SURGERY     X 2   RECTAL PROLAPSE REPAIR     RETINAL DETACHMENT SURGERY Bilateral 2000   TOTAL KNEE ARTHROPLASTY Left 04/29/2019   Procedure: LEFT TOTAL KNEE ARTHROPLASTY;  Surgeon: Kathryne Hitch, MD;  Location: [facility];  Service: Orthopedics;  Laterality: Left;   TUBAL LIGATION     VEIN SURGERY Left 04/2010   Past Medical History:  Diagnosis Date   Allergy    Anal fissure    Anemia    borderline   Anxiety    Arthritis    Blood clot in vein    left leg   Detached retina    BOTH SCLERA BUCKLE BOTH EYES   DVT (deep venous thrombosis) (HCC)    left leg   Endometriosis    Family history of adverse reaction to anesthesia    DAUGHTER POST OP PONV   GERD (gastroesophageal reflux disease)    Glaucoma  RIGHT WORST THAN LEFT   Heart murmur    "caused by anxiety"    History of hemorrhoids    History of kidney stones    Hypertension    IBS (irritable bowel syndrome)    Perforated eardrum, right    SMALL HOLE   PONV (postoperative nausea and vomiting)    Pulmonary embolism (HCC) 6 YRS AGO   Status post arthroscopy of left knee 08/06/2017   BP (!) 141/86   Pulse 76   Ht 5\' 7"  (1.702 m)   Wt 168 lb 3.2 oz (76.3 kg)   LMP 10/11/2010   SpO2 97%   BMI 26.34 kg/m   Opioid Risk Score:   Fall Risk Score:  `1  Depression screen PHQ 2/9     04/30/2023   12:20 PM 04/10/2023   12:54 PM 03/27/2023    2:11 PM 01/29/2023    2:27 PM 10/24/2022    2:28 PM 10/11/2022   10:15 AM 08/25/2022   10:50 AM  Depression screen PHQ 2/9  Decreased Interest 2 2 0 0  2 2  Down, Depressed, Hopeless 1 0 0 0  1 1  PHQ - 2 Score 3 2 0 0  3 3  Altered sleeping 0 1   1 0 3  Tired, decreased energy 3 3   1 3 3   Change in appetite 0 0    2 0  Feeling bad or failure about yourself  0 0    0 0  Trouble concentrating 0 0    0 0  Moving slowly or fidgety/restless 0 0    0 0  Suicidal thoughts 0 0    0 0  PHQ-9 Score 6 6    8 9   Difficult doing work/chores  Very difficult     Very difficult Extremely dIfficult   Review of Systems  Constitutional: Negative.        Weight loss  HENT: Negative.   Eyes: Negative.   Respiratory: Positive for shortness of breath.   Cardiovascular: Negative.   Gastrointestinal: Negative.   Endocrine: Negative.   Genitourinary: Negative.   Musculoskeletal: Positive for arthralgias, back pain and gait problem.       Spasms  Skin: Negative.   Allergic/Immunologic: Negative.   Neurological: Positive for tremors and numbness.  Hematological: Negative.   Psychiatric/Behavioral:       Anxiety  All other systems reviewed and are negative.      Objective:  PRIOR EXAM: Gen: no distress, normal appearing, BMI 25.22, weight 159 lbs, BP 137/83 HEENT: oral mucosa pink and moist, NCAT Cardio: Reg rate Chest: normal effort, normal rate of breathing Abd: soft, non-distended Ext: no edema Psych: pleasant, normal affect Skin: intact Neuro: Alert and oriented x3 MSK: ambulates with cane  Assessment & Plan:  Mrs. Bridwell is a 57 year old woman who presents for follow-up:   1) Left knee pain post-surgery: -discussed that knee pain has worsened since she has been weaning off her medications to avoid their potential side effects -discussed that even activities such as changing her bed sheets can aggravate her pain -She has a plan for repeat surgical procedure with Dr. Magnus Ivan that will hopefully provide relief.  -She has been icing regularly- recommended three tines per day for 15 minutes. Discussed that this decreases blood flow and can impair healing but help with swelling and pain relief.  -discussed the limitations of her pain on her function -discussed extracorporeal shockwave therapy and how this can help break apart  scar tissue -unable to work due the level of her pain -discussed current impact on her life -continue cane for balance -She has been through physical therapy and performs her exercises diligently. --Discussed  Sprint PNS system as an option of pain treatment via neuromodulation. Provided following link for patient to learn more about the system: https://www.sprtherapeutics.com/. She does not like the feeling of things under her skin and defers that option.  -She weaned herself all the steroids.  -Provided with a pain relief journal and discussed that it contains foods and lifestyle tips to naturally help to improve pain. Discussed that these lifestyle strategies are also very good for health unlike some medications which can have negative side effects. Discussed that the act of keeping a journal can be therapeutic and helpful to realize patterns what helps to trigger and alleviate pain.   -discussed mechanism of action of low dose naltrexone as an opioid receptor antagonist which stimulates your body's production of its own natural endogenous opioids, helping to decrease pain. Discussed that it can also decrease T cell response and thus be helpful in decreasing inflammation, and symptoms of brain fog, fatigue, anxiety, depression, and allergies. Discussed that this medication needs to be compounded at a compounding pharmacy and can more expensive. Discussed that I usually start at 1mg  and if this is not providing enough relief then I titrate upward on a monthly basis.   -Discussed Qutenza as an option for neuropathic pain control. Discussed that this is a capsaicin patch, stronger than capsaicin cream. Discussed that it is currently approved for diabetic peripheral neuropathy and post-herpetic neuralgia, but that it has also shown benefit in treating other forms of neuropathy. Provided patient with link to site to learn more about the patch: https://www.clark.biz/. Discussed that the patch would be placed in office and benefits usually last 3 months. Discussed that unintended exposure to capsaicin can cause severe irritation of eyes, mucous membranes, respiratory tract, and skin, but that Qutenza is a local  treatment and does not have the systemic side effects of other nerve medications. Discussed that there may be pain, itching, erythema, and decreased sensory function associated with the application of Qutenza. Side effects usually subside within 1 week. A cold pack of analgesic medications can help with these side effects. Blood pressure can also be increased due to pain associated with administration of the patch.  -continue ice     -continue Robaxin as she is nervous about feeling too sleepy with the Tizanidine, and the Robaxin does help.   Continue gabapentin to 600mg  TID PRN.   -Continue neoprene knee sleeve for proprioception  -She has tried Celebrex and Tramadol without benefit. She had respiratory distress with hydrocodone.   -She had worse pain with steroid injection in her left knee.   -Discussed steroids but she did not like the side effect profile. Discussed the different steroid options. She weaned off these because she developed mouth sores.   -Continue turmeric and cherries.   -Continue Lidocaine patches today.   -She would like rheum panel checked: ordered, reviewed results which were negative, and discussed with patient.  -Refilled meloxicam.   2) Bunion:  -continue orthotics  3) Irritable bowel syndrome: -She does not eat much sugar or sweets.  -She drinks 1 coke per day.  -She cuts tea out.  -She drinks water, coffee.  -She has been going more regularly and has not needed her Miralax.  -food allergy testing ordered  4) Impaired Concentration: - This persists -She forgets things easily.  -  She tries not to multitask when she was with her grandmother.   5) Anxiety: The Gabapentin is helping with this as well, continue -discussed plan to start ecitalopram and that she has cleared this with her ophthalmologist -discussed increasing trazodone -Buspar prescribed, discussed that she has not yet tried this but plans to  6) Vulvodynia: -discussed various treatment  options: biofeedback, medications, continue estradiol.  -continue Amitriptyline 10mg  HS, discussed moving dose earlier in the evening.   7) Cervical and lumbar myofascial pain: -try massage -discussed benefits of therapy and trigger point injections -apply lidocaine patch.   8) Bilateral hand pain: likely secondary to arthritis compounded by carpal tunnel syndrome -continue Gabapentin which is helping -continue f/u with surgery as planned.   9) Low back pain, appears to be secondary to face arthritis -discussed XR, but she defers due to potential cost -discussed surgery with an orthopedic and she would like to consider this but her insurance would not cover it.  -discussed lack of improvement with injections from Dr. Modesto Charon.  -continue lidocaine patches, discussed that these make her feel weird when she takes them off.  - Ketamine 10%, Baclofen 2%, Cyclobenzaprine 2%, Ketoprofen 10%, Gabapentin 6%, Bupivacaine 1%, Amitryptiline 5%, clonidine 0.2%, continue, discussed this is helpful  Dispense 240 g  Apply 1-2 g to affected area 3-4 times per day  -continue postural correction -Provided with a pain relief journal and discussed that it contains foods and lifestyle tips to naturally help to improve pain. Discussed that these lifestyle strategies are also very good for health unlike some medications which can have negative side effects. Discussed that the act of keeping a journal can be therapeutic and helpful to realize patterns what helps to trigger and alleviate pain.   -Discussed following foods that may reduce pain: 1) Ginger (especially studied for arthritis)- reduce leukotriene production to decrease inflammation 2) Blueberries- high in phytonutrients that decrease inflammation 3) Salmon- marine omega-3s reduce joint swelling and pain 4) Pumpkin seeds- reduce inflammation 5) dark chocolate- reduces inflammation 6) turmeric- reduces inflammation 7) tart cherries - reduce pain and  stiffness 8) extra virgin olive oil - its compound olecanthal helps to block prostaglandins  9) chili peppers- can be eaten or applied topically via capsaicin 10) mint- helpful for headache, muscle aches, joint pain, and itching 11) garlic- reduces inflammation  Link to further information on diet for chronic pain: http://www.bray.com/   10) shoulder pain -used to get shots in the pt, but laying on her back has helped to decrease her shoulder pain  11) Acid reflux -continues famotidine.   11) Soreness in legs -vascular ultrasound ordered of both legs -discussed her history of clots -discussed that a D-dimer test can suggest clot if elevated.   12) Irritable bowel syndrome -discussed her current diet -discussed that she does not eat breakfast due to nausea -discussed her diarrhea and constipation, more diarrheal type at this time.  -discussed her aunt's advise to have a tsp coconut oil daily.  -recommended bioptemizer's magnesium breakthrough -reviewed food allergies with her and made goal for 4 week elimination of foods with the highest responses  13) Insomnia/Shortness of breath with history of clots -discussed that there is no evidence of PE on her CTA -discussed her follow-up with PCP -discussed that her vascular ultrasound of her lower extremities shows a chronic left peroneal CVT  -recommended starting nattokinase to help thin the blood and lower her risk for clots, or to eat natto -discussed the importance of movement in clot prevention -  referred for sleep study for shortness of breath  14) Impaired memory: -discussed her memory difficulties. -discussed that memory is improving since stopping her klonopin and decreasing added sugar in her diet -discussed that some studies have linked chronic PPI use to memory impairment -discussed safe way to wean off these medications slowly  15)  Hemorroids: Recommended Epsom salt baths  16) prediabetes -advised switching from sweetened black tea to black tea with allulose or honey - recommended avoiding processed foods/added sugars/bread/pasta/rice -recommended drinking a glass of water with either apple cide vinegar or lemon -recommended reading Wheat Belly and Grain Brain -recommended eating fruits, vegetables, yogurt, nuts, olive oil -discussed risks and benefits of metformin -discussed that repeat labs show normalization of HgbA1c! -discussed that she does not eat much anymore -encouraged getting enough protein -HgbA1c reviewed and is 5.8, discussed with patient -discussed that it is hard to maintain a good diet on the holidays  17) Transaminitis -recommended avoiding ultraprocessed foods -recommended avoiding/minimizing tylenol -recommended supplement NAC 600mg  BID  -recommended drinking dandelion and milk thistle teas -commended on stopping drinking soda products! -recommended avoiding processed creamer in coffee, continue drinking coffee -discussed that repeat CMP shows normalization of liver enzymes! -check CMP today  18) Bilateral knee pain: -encouraged use of exercise bike to strengthen quadriceps tendon -continue robaxin, meloxciam -discussed PT -discussed tens unit -discussed how the pain limits her ability work  19) Dry mouth: -stop amitriptyline  20) Benzodiazepine withdrawal: -discussed that this should be weaned slowly  31) Restless leg syndrome:  -discussed requip, will prescribe  32) Insomnia: -prescribed trazodone, discussed that this promotes increased deep sleep, discussed that this has helped -recommended tart cherry juice -discussed that she has been on Klonopin for 12 years and is trying to wean off as her PCP is concerned it may be contributing to her brain fog -discussed that Klonopin can contribute to memory decline and commended her for trying to wean off -discussed that she has been  having dry mouth and she is not sure if it is from the Requip. She has stopped taking this in case and dry mouth has continued -discussed that robaxin can also cause dry mouth -discussed taking muscle relaxer earlier in the evening so she does not have to wake at night to drink water  33) Chest pain: -discussed current symptoms -prescribed nitroglycerin  34) Elevated BUN -encouraged 6-8 glasses of water per day -discussed that creatinine is normal  35. Diffuse arthritis: -discussed potential benefits of boswellia -continue compounded cream, discussed potentially alternating with blue emu oil -discussed aquatherapy -prescribed PT  40 minutes spent in discussion of her back pain, insomnia, bilateral knee pain, refilling her robaxin and meloxicam,  her diffuse arthritis, prescribed PT, discussed aquatherapy, discussed that compounded cream has been helpful but is expensive for her, discussed Boswellia for arthritis, discussed trazodone for insomnia, discussed that she likes to care for her grandchildren once in a while, discussed that it has been hard for to maintain a good diet during the holidays, reviewed blood pressure, discussed her anxiety and trying buspar, discussed her depression regarding being unable to work and hike and doing things with her grandchildren

## 2023-06-11 NOTE — Patient Instructions (Signed)
Boswellia  Foods that may reduce pain: 1) Ginger (especially studied for arthritis)- reduce leukotriene production to decrease inflammation 2) Blueberries- high in phytonutrients that decrease inflammation 3) Salmon- marine omega-3s reduce joint swelling and pain 4) Pumpkin seeds- reduce inflammation 5) dark chocolate- reduces inflammation 6) turmeric- reduces inflammation 7) tart cherries - reduce pain and stiffness 8) extra virgin olive oil - its compound olecanthal helps to block prostaglandins  9) chili peppers- can be eaten or applied topically via capsaicin 10) mint- helpful for headache, muscle aches, joint pain, and itching 11) garlic- reduces inflammation  Link to further information on diet for chronic pain: [email or web address]

## 2023-06-15 DIAGNOSIS — G4733 Obstructive sleep apnea (adult) (pediatric): Secondary | ICD-10-CM | POA: Diagnosis not present

## 2023-06-18 DIAGNOSIS — G4733 Obstructive sleep apnea (adult) (pediatric): Secondary | ICD-10-CM | POA: Diagnosis not present

## 2023-06-25 ENCOUNTER — Ambulatory Visit: Payer: Medicare HMO | Admitting: Neurology

## 2023-07-02 ENCOUNTER — Encounter: Payer: Self-pay | Admitting: Primary Care

## 2023-07-02 ENCOUNTER — Ambulatory Visit: Payer: Medicare HMO | Admitting: Primary Care

## 2023-07-02 VITALS — BP 135/81 | HR 68 | Temp 97.3°F | Ht 67.0 in | Wt 166.2 lb

## 2023-07-02 DIAGNOSIS — G4730 Sleep apnea, unspecified: Secondary | ICD-10-CM

## 2023-07-02 DIAGNOSIS — R058 Other specified cough: Secondary | ICD-10-CM | POA: Diagnosis not present

## 2023-07-02 MED ORDER — BENZONATATE 100 MG PO CAPS: 100.0000 mg | ORAL_CAPSULE | Freq: Three times a day (TID) | ORAL | 1 refills | Status: DC | PRN

## 2023-07-02 NOTE — Progress Notes (Signed)
 @Patient  ID: Sharon [name], female    DOB: 1966-02-24, 58 y.o.   MRN: [id number]  Chief Complaint  Patient presents with   Follow-up    Referring provider: Corwin Antu, FNP  HPI: 59 year old female, never smoked.  Past medical history significant for hypertension, reactive airway disease, GERD, endometriosis, iron deficiency, insomnia.  PSG 05/12/2015 showed no evidence of obstructive sleep apnea, AHI 0 with SPO2 low 92%. Weight 149lbs.   Previous [facility] pulmonary encounter: 03/17/2022 Patient presents today for sleep consult. She had sleep study in 2016 that was negative for sleep apnea. She continues to have snoring symptoms and waking up at night feeling short of breath. Her weight is up 20-30 lbs since last study. Typical bedtime is between 10pm-12am. It can take her 30-60 mins to fall asleep. She wakes up several times a night. She starts her day between 6:30am and 10am. Denies symptoms of narcolepsy, cataplexy or sleep walking.   In addition to poor sleep she has symptoms of shortness of breath during the daytime, nocturnal chest tightness, np cough and indigestion. She has never smoked. No PFTs on file. She reports symptoms of reflux and throat irritation/inflammation. She was not able to take famotidine  at night but does take 20mg  in the morning.   Sleep questionnaire Symptoms-  cant breath/waking up at night, inflammation in throat  Prior sleep study- 2016 Bedtime- 10pm-12am Time to fall asleep- 30-71min  Nocturnal awakenings- 3-5 times Out of bed/start of day- 6:30am-10am Weight changes- 30 lbs Do you operate heavy machinery- No Do you currently wear CPAP- No Do you current wear oxygen- No Epworth- 10  07/26/2022 Patient presents today to review sleep study results.  Patient was seen for sleep consult September.  She reported having symptoms of snoring and waking up at night short of breath. She had home sleep study on 07/02/2022 that showed evidence of mild obstructive  sleep apnea, AHI 7.1 an hour with SpO2 low 68% (average 93%).   She has a hard time getting out of bed in the morning d.t fatigue. She feels exhausted during the day. She does not fall asleep driving. It is hard for her to sleep on her side, she has been sleeping on her back since having knee surgery. She just received disability.    09/26/2022 Patient presents today for OSA follow-up.  Patient had a home sleep study on 07/02/2022 that showed evidence of mild obstructive sleep apnea, AHI 7.1 an hour.  She was started on auto CPAP 5 to 15 cm H2O. She is doing well, she is 90% compliant with CPAP use > 4 hours over the last 30 days. She does not feel as tired and she is sleeping better. She is no longer having vivid dreams/nightmares. She is using nasal cradle mask, at times she has a hard time breathing out when wearing her mask. She was provided a chin strap. We discussed adjusting her pressure settings but she does not want to make any changes today.   Airview download 08/26/22-09/24/22 30/30 days (100%); 27 days (90%) > 4 hours Average usage 6 hours 1 minutes Pressure 5-15cm h20 Airleaks 8.2L/min  AHI 2.4   12/26/2022 Patient presents today for 11-month follow-up OSA.  She is doing well. She is 100% compliant with CPAP use and reports benefit from therapy.  Sleeping much better doing well. She is not requiring as much sleep. No longer snoring or waking up gasping or choking when wearing CPAP.  She switched to a full nasal mask, not  needing chinstrap. Dry mouth improved by changing to full nasal mask. No longer sleeping with her mouth open, has not required chin strap.   Airview download 11/25/2022 - 12/24/2022 30/30 days (100%); 29 days (97%) greater than 4 hours Average usage 6 hours 12 minutes Pressure 5 to 15 cm H2O (8.6cm h20- 95%) Airleaks 13.2L/min AHI 2.2   HST on 07/02/2022 >> mild obstructive sleep apnea, AHI 7.1 an hour with SpO2 low 68% (average 93%). Started on auto CPAP in February 2024. She  is 97% compliance with CPAP use > 4 hours last 30 days.  She reports improvement in sleep quality with PAP therapy. Average usage 6 hours 12 mins. Pressure 5-15cm h20; Residual AHI 2.2/hour. Using nasal mask, dry mouth has improved. Not requiring chin strap. Humidity at level 5. No changes recommended today.  Advised patient continue her CPAP nightly for 4 to 6 hours.  Encouraged weight loss efforts as able.  Cautioned against alcohol/sedative use prior to bedtime as these can worsen underlying sleep apnea and avoid driving if experiencing excessive daytime sleepiness. FU in 6 months or sooner if needed.   07/02/2023- Interim hx  Discussed the use of AI scribe software for clinical note transcription with the patient, who gave verbal consent to proceed.  History of Present Illness   The patient, with a history of hypertension, reactive airway disease, and acid reflux, has been followed for sleep apnea diagnosed in 2020. She was started on CPAP therapy in the spring of the same year. The patient reports intermittent difficulty with the CPAP, feeling as though she is not getting enough air at times. She has found some relief by slightly pulling the mask away from her face. Despite these issues, the patient reports that once she falls asleep, she sleeps well and does not wake up in the middle of the night.  The patient also reports a residual cough following a recent bout of COVID-19 three weeks ago. She has not tried any over-the-counter remedies due to allergies. The patient has a history of multiple food allergies, which have increased following a knee replacement. She carries an EpiPen  for severe reactions.  The patient also reports some difficulty with acid reflux, particularly with certain foods and eating late at night. She avoids eating thick foods before bed due to a sensation of something being stuck in her throat.     Ariview download 05/30/23-06/28/23 Usage days 21/30 days (70%) Average usage  days used 4 hours 57 minutes Pressure 5 to 15 cm H2O (8.5 cm H2O-95%) Air leak 13.8 L/min (95%) AHI 2.6  Allergies  Allergen Reactions   Bactrim  [Sulfamethoxazole -Trimethoprim ] Shortness Of Breath   Bee Venom Anaphylaxis and Other (See Comments)   Penicillins Other (See Comments) and Swelling    Reaction unknown from childhood Has patient had a PCN reaction causing immediate rash, facial/tongue/throat swelling, SOB or lightheadedness with hypotension: unknown Has patient had a PCN reaction causing severe rash involving mucus membranes or skin necrosis: unknown  Has patient had a PCN reaction that required hospitalization: no Has patient had a PCN reaction occurring within the last 10 years: no If all of the above answers are NO, then may proceed with Cephalosporin use.  Other reaction(s): Other, Unknown Throat swells Throat swells Reaction unknown from childhood Has patient had a PCN reaction causing immediate rash, facial/tongue/throat swelling, SOB or lightheadedness with hypotension: unknown Has patient had a PCN reaction causing severe rash involving mucus membranes or skin necrosis: unknown  Has patient had a PCN  reaction that required hospitalization: no Has patient had a PCN reaction occurring within the last 10 years: no If all of the above answers are NO, then may proceed with Cephalosporin use.  Other reaction(s): Not available   Dextromethorphan     Other reaction(s): Lump in throat Other reaction(s): Lump in throat   Elavil  [Amitriptyline ] Other (See Comments)    Dry mouth   Hydrocodone -Acetaminophen  Other (See Comments)   Oxycodone  Other (See Comments)   Penicillin G     Other reaction(s): Unknown   Dextromethorphan Polistirex Er Other (See Comments)    Pt states caused a lump in her throat, making it difficult to swallow   Fluconazole      Primidone  interaction Other reaction(s): Primidone  interaction Other reaction(s): Primidone  interaction   Hydrocodone   Nausea And Vomiting and Nausea Only    Other reaction(s): nausea and vomiting Other reaction(s): nausea and vomiting   Oxycodone -Acetaminophen  Other (See Comments)    Made fingers tingle and go numb, started feeling weird.  Other reaction(s): Other Made fingers tingle and go numb, started feeling weird.  Other reaction(s): tingle/numb fingers Other reaction(s): tingle/numb fingers   Tape Itching and Other (See Comments)    Bandaids - leaves red marks  Other reaction(s): itching    Immunization History  Administered Date(s) Administered   Influenza Whole 03/19/2010   Influenza,inj,Quad PF,6+ Mos 03/25/2015, 04/13/2021   Influenza-Unspecified 03/19/2018   Moderna Sars-Covid-2 Vaccination 06/21/2019, 07/19/2019, 08/06/2020   Tdap 11/08/2016    Past Medical History:  Diagnosis Date   Allergy     Anal fissure    Anemia    borderline   Anxiety    Arthritis    Blood clot in vein    left leg   Detached retina    BOTH SCLERA BUCKLE BOTH EYES   DVT (deep venous thrombosis) (HCC)    left leg   Endometriosis    Family history of adverse reaction to anesthesia    DAUGHTER POST OP PONV   GERD (gastroesophageal reflux disease)    Glaucoma    RIGHT WORST THAN LEFT   Heart murmur    caused by anxiety    History of hemorrhoids    History of kidney stones    Hypertension    IBS (irritable bowel syndrome)    Perforated eardrum, right    SMALL HOLE   PONV (postoperative nausea and vomiting)    Pulmonary embolism (HCC) 6 YRS AGO   Status post arthroscopy of left knee 08/06/2017    Tobacco History: Social History   Tobacco Use  Smoking Status Never  Smokeless Tobacco Never   Counseling given: Not Answered   Outpatient Medications Prior to Visit  Medication Sig Dispense Refill   Acetylcysteine (NAC) 600 MG CAPS Take 600 mg by mouth 2 (two) times daily.     acyclovir  (ZOVIRAX ) 400 MG tablet Take 1 tablet by mouth daily.     albuterol  (VENTOLIN  HFA) 108 (90 Base)  MCG/ACT inhaler INHALE 1-2 PUFFS BY MOUTH EVERY 6 HOURS AS NEEDED FOR WHEEZE OR SHORTNESS OF BREATH 8.5 each 2   benzonatate  (TESSALON ) 100 MG capsule Take 1 capsule (100 mg total) by mouth 3 (three) times daily as needed. 21 capsule 0   bimatoprost (LUMIGAN) 0.03 % ophthalmic solution Place 1 drop into both eyes at bedtime.     busPIRone  (BUSPAR ) 5 MG tablet TAKE 1 TABLET BY MOUTH THREE TIMES A DAY 270 tablet 2   cefdinir  (OMNICEF ) 300 MG capsule Take 1 capsule (300 mg total) by  mouth 2 (two) times daily. 14 capsule 0   Cholecalciferol  (VITAMIN D ) 50 MCG (2000 UT) tablet 1 tablet     clobetasol  (TEMOVATE ) 0.05 % external solution APPLY TO AFFECTED AREA TWICE A DAY 50 mL 0   desonide  (DESOWEN ) 0.05 % cream Apply topically 2 (two) times daily. 30 g 0   EPINEPHrine  0.3 mg/0.3 mL IJ SOAJ injection Inject 0.3 mg into the muscle as needed for anaphylaxis. 1 each 0   estradiol  (ESTRACE ) 0.1 MG/GM vaginal cream Insert pea-sized amount nightly for 2 weeks then every other night     famotidine  (PEPCID ) 40 MG tablet Take 1 tablet (40 mg total) by mouth daily. 90 tablet 1   gabapentin  (NEURONTIN ) 100 MG capsule Take 2 capsules (200 mg total) by mouth at bedtime. 180 capsule 1   gabapentin  (NEURONTIN ) 600 MG tablet Take 1 tablet (600 mg total) by mouth 3 (three) times daily. 90 tablet 3   lidocaine  (LIDODERM ) 5 % Apply 1 patch topically daily.     LUMIGAN 0.01 % SOLN 1 drop at bedtime.     meloxicam  (MOBIC ) 15 MG tablet Take 1 tablet (15 mg total) by mouth daily. 30 tablet 2   methocarbamol  (ROBAXIN ) 500 MG tablet Take 1 tablet (500 mg total) by mouth every 8 (eight) hours as needed for muscle spasms. 90 tablet 1   mometasone  (NASONEX ) 50 MCG/ACT nasal spray Place 2 sprays into the nose daily. 1 each 12   nebivolol  (BYSTOLIC ) 5 MG tablet TAKE 1 TABLET BY MOUTH EVERY DAY 90 tablet 3   nitroGLYCERIN  (NITROSTAT ) 0.4 MG SL tablet Place 1 tablet (0.4 mg total) under the tongue every 5 (five) minutes as needed for  chest pain. 30 tablet 12   Nitroglycerin  (RECTIV ) 0.4 % OINT Place 0.4 inches rectally 2 (two) times daily as needed (For anal fissures.). 30 g 1   ondansetron  (ZOFRAN -ODT) 4 MG disintegrating tablet 1 tablet on the tongue and allow to dissolve Orally Once a day for 30 day(s) 20 tablet 0   pantoprazole  (PROTONIX ) 20 MG tablet Take 1 tablet (20 mg total) by mouth daily. 90 tablet 3   pantoprazole  (PROTONIX ) 40 MG tablet TAKE 1 TABLET (40 MG TOTAL) BY MOUTH TWICE A DAY BEFORE MEALS 180 tablet 1   polyethylene glycol powder (GLYCOLAX /MIRALAX ) 17 GM/SCOOP powder See admin instructions.     primidone  (MYSOLINE ) 50 MG tablet Take 1 tablet in morning and 3 tablets at bedtime. 120 tablet 5   traZODone  (DESYREL ) 50 MG tablet Take 1 tablet (50 mg total) by mouth at bedtime as needed for sleep. 90 tablet 3   triamcinolone  cream (KENALOG ) 0.1 % Apply to affected areas hands twice daily until improved, then as needed for recurrence. Avoid applying to face, groin, and axilla. Use as directed. Long-term use can cause thinning of the skin. 45 g 3   vitamin B-12 (CYANOCOBALAMIN ) 1000 MCG tablet Take 1 tablet (1,000 mcg total) by mouth daily. 30 tablet 3   Zinc  50 MG TABS 1 tablet Orally Once a day for 30 day(s)     No facility-administered medications prior to visit.   Review of Systems  Review of Systems  Constitutional: Negative.   HENT:  Positive for sore throat.   Respiratory:  Positive for cough.   Gastrointestinal: Negative.   Psychiatric/Behavioral: Negative.       Physical Exam  BP 135/81 (BP Location: Right Arm, Patient Position: Sitting, Cuff Size: Normal)   Pulse 68   Temp (!) 97.3 F (36.3  C) (Temporal)   Ht 5' 7 (1.702 m)   Wt 166 lb 3.2 oz (75.4 kg)   LMP 10/11/2010   SpO2 97%   BMI 26.03 kg/m  Physical Exam Constitutional:      Appearance: Normal appearance.  HENT:     Head: Normocephalic and atraumatic.     Mouth/Throat:     Mouth: Mucous membranes are moist.     Pharynx:  Oropharynx is clear. No oropharyngeal exudate or posterior oropharyngeal erythema.  Cardiovascular:     Rate and Rhythm: Normal rate and regular rhythm.  Pulmonary:     Effort: Pulmonary effort is normal.     Breath sounds: Normal breath sounds.  Musculoskeletal:     Cervical back: Normal range of motion and neck supple. No rigidity.  Lymphadenopathy:     Cervical: No cervical adenopathy.  Skin:    General: Skin is warm and dry.  Neurological:     General: No focal deficit present.     Mental Status: She is alert and oriented to person, place, and time. Mental status is at baseline.  Psychiatric:        Mood and Affect: Mood normal.        Behavior: Behavior normal.        Thought Content: Thought content normal.        Judgment: Judgment normal.      Lab Results:  CBC    Component Value Date/Time   WBC 5.8 08/25/2022 1102   RBC 4.53 08/25/2022 1102   HGB 13.2 08/25/2022 1102   HGB 12.0 01/20/2019 1649   HCT 38.9 08/25/2022 1102   HCT 35.5 01/20/2019 1649   PLT 293.0 08/25/2022 1102   PLT 293 01/20/2019 1649   MCV 85.9 08/25/2022 1102   MCV 84.7 06/09/2019 1451   MCV 87 01/20/2019 1649   MCH 30.9 11/08/2019 0737   MCHC 34.0 08/25/2022 1102   RDW 14.1 08/25/2022 1102   RDW 12.6 01/20/2019 1649   LYMPHSABS 1.6 06/07/2020 1625   LYMPHSABS 1.7 07/19/2018 1321   MONOABS 0.5 06/07/2020 1625   EOSABS 0.1 06/07/2020 1625   EOSABS 0.2 07/19/2018 1321   BASOSABS 0.1 06/07/2020 1625   BASOSABS 0.0 07/19/2018 1321    BMET    Component Value Date/Time   NA 142 01/29/2023 1605   K 4.5 01/29/2023 1605   CL 102 01/29/2023 1605   CO2 23 01/29/2023 1605   GLUCOSE 76 01/29/2023 1605   GLUCOSE 97 08/25/2022 1102   BUN 17 01/29/2023 1605   CREATININE 0.70 01/29/2023 1605   CREATININE 0.73 10/21/2015 1919   CALCIUM 9.7 01/29/2023 1605   GFRNONAA >60 11/08/2019 0737   GFRAA >60 11/08/2019 0737    BNP No results found for: BNP  ProBNP No results found for:  PROBNP  Imaging: No results found.   Assessment & Plan:   1. Post-viral cough syndrome (Primary)  2. Mild sleep apnea - Ambulatory Referral for DME     Obstructive Sleep Apnea Patient reports difficulty with CPAP machine, feeling like not getting enough air initially when she goes to sleep at night. Currently on auto settings with pressure range 5-15cm h20 (8.5cm h30 - 95%)  -Change CPAP settings to a fixed pressure of 8cm h20. -Advise patient to adjust ramp settings on CPAP machine to immediate pressure upon initiation. -If patient is not comfortable with new settings, revert back to auto settings.  Residual Cough post COVID-19 Patient reports persistent cough post COVID-19 infection. Allergic to some over-the-counter  cough suppressants. -Prescribe Tessalon  Perles (benzonatate ) for cough suppression. -Advise patient to try honey, lozenges, and sips of water for symptomatic relief.  Globus sensation  Refer back to ENT- Dr. Carlie for evaluation   Follow-up in 6 months, with the option for a virtual visit.              Almarie LELON Ferrari, NP 07/02/2023

## 2023-07-02 NOTE — Patient Instructions (Addendum)
 Recommendations: Call ENT for eval due to globus sensation Adjust RAMP time, try turning this off or auto settings  Aim to wear CPAP every night 4-6 hours or more  Take Tessalon  perles three times a day for cough   Go to the machine's settings Select the ramp time feature Choose the desired ramp time Set the starting pressure Save the changes Specific steps  ResMed AirSense 10 Press the dial to enter My Options  Turn the dial to highlight Ramp Time  Press the dial to select Ramp Time  Turn the dial to adjust the setting  Press the dial to save the change   ENT Carlie Vaughan Lenis, MD  [address]  [address]  [place], [place] [address]  [phone] (Work)  [phone] (Fax)   Orders: Change pressure 8cm h20   Follow-up:  6 months with Beth NP or sooner if needed    Benzonatate  Capsules What is this medication? BENZONATATE  (ben ZOE na tate) is used to relieve cough. It works by calming your cough reflex. It belongs to a group of medications called cough suppressants. This medicine may be used for other purposes; ask your health care provider or pharmacist if you have questions. COMMON BRAND NAME(S): Tessalon  Perles, Zonatuss What should I tell my care team before I take this medication? They need to know if you have any of these conditions: Kidney disease Liver disease An unusual or allergic reaction to benzonatate , other medications, foods, dyes, or preservatives Pregnant or trying to get pregnant Breastfeeding How should I use this medication? Take this medication by mouth with a glass of water. Follow the directions on the prescription label. Avoid breaking, chewing, or sucking the capsule, as this can cause serious side effects. Take your medication at regular intervals. Do not take your medication more often than directed. Talk to your care team about the use of this medication in children. While this medication may be prescribed for children as young  as 46 years old for selected conditions, precautions do apply. Overdosage: If you think you have taken too much of this medicine contact a poison control center or emergency room at once. NOTE: This medicine is only for you. Do not share this medicine with others. What if I miss a dose? If you miss a dose, take it as soon as you can. If it is almost time for your next dose, take only that dose. Do not take double or extra doses. What may interact with this medication? Do not take this medication with any of the following: MAOIs, such as Carbex, Eldepryl, Marplan, Nardil, and Parnate This list may not describe all possible interactions. Give your health care provider a list of all the medicines, herbs, non-prescription drugs, or dietary supplements you use. Also tell them if you smoke, drink alcohol, or use illegal drugs. Some items may interact with your medicine. What should I watch for while using this medication? Tell your care team if your symptoms do not improve or if they get worse. If you have a high fever, skin rash, or headache, see your care team. This medication may affect your coordination, reaction time, or judgment. Do not drive or operate machinery until you know how this medication affects you. Sit up or stand slowly to reduce the risk of dizzy or fainting spells. Drinking alcohol with this medication can increase the risk of these side effects. What side effects may I notice from receiving this medication? Side effects that you should report to  your care team as soon as possible: Allergic reactions--skin rash, itching, hives, swelling of the face, lips, tongue, or throat Confusion Hallucinations Side effects that usually do not require medical attention (report to your care team if they continue or are bothersome): Burning or tingling of the tongue, mouth, throat, or face Dizziness Drowsiness This list may not describe all possible side effects. Call your doctor for medical  advice about side effects. You may report side effects to FDA at 1-800-FDA-1088. Where should I keep my medication? Keep out of the reach of children and pets. Store at room temperature between 20 and 25 degrees C (68 and 77 degrees F). Keep tightly closed. Protect from light and moisture. Get rid of any unused medication after the expiration date. To get rid of medications that are no longer needed or have expired: Take the medication to a medication take-back program. Check with your pharmacy or law enforcement to find a location. If you cannot return the medication, check the label or package insert to see if the medication should be thrown out in the garbage or flushed down the toilet. If you are not sure, ask your care team. If it is safe to put it in the trash, empty the medication out of the container. Mix the medication with cat litter, dirt, coffee grounds, or other unwanted substance. Seal the mixture in a bag or container. Put it in the trash. NOTE: This sheet is a summary. It may not cover all possible information. If you have questions about this medicine, talk to your doctor, pharmacist, or health care provider.  2024 Elsevier/Gold Standard (2021-12-08 00:00:00)

## 2023-07-03 NOTE — Therapy (Signed)
 OUTPATIENT PHYSICAL THERAPY LOWER EXTREMITY EVALUATION   Patient Name: Sharon [name] MRN: [id number] DOB:Oct 14, 1965, 58 y.o., female Today's Date: 07/04/2023  END OF SESSION:  PT End of Session - 07/04/23 1519     Visit Number 1    Number of Visits 9    Date for PT Re-Evaluation 08/29/23    Authorization Type [employer]    PT Start Time 1525    PT Stop Time 1615    PT Time Calculation (min) 50 min    Activity Tolerance Patient tolerated treatment well    Behavior During Therapy WFL for tasks assessed/performed             Past Medical History:  Diagnosis Date   Allergy     Anal fissure    Anemia    borderline   Anxiety    Arthritis    Blood clot in vein    left leg   Detached retina    BOTH SCLERA BUCKLE BOTH EYES   DVT (deep venous thrombosis) (HCC)    left leg   Endometriosis    Family history of adverse reaction to anesthesia    DAUGHTER POST OP PONV   GERD (gastroesophageal reflux disease)    Glaucoma    RIGHT WORST THAN LEFT   Heart murmur    "caused by anxiety"    History of hemorrhoids    History of kidney stones    Hypertension    IBS (irritable bowel syndrome)    Perforated eardrum, right    SMALL HOLE   PONV (postoperative nausea and vomiting)    Pulmonary embolism (HCC) 6 YRS AGO   Status post arthroscopy of left knee 08/06/2017   Past Surgical History:  Procedure Laterality Date   ABDOMINAL HYSTERECTOMY     still with both ovaries   BREAST BIOPSY Left 12/19/2022   US  LT BREAST BX W LOC DEV 1ST LESION IMG BX SPEC US  GUIDE 12/19/2022 [facility]   CATARACT EXTRACTION Bilateral 2015   COLONOSCOPY     CYSTOSCOPY W/ URETERAL STENT PLACEMENT Right 05/23/2017   Procedure: CYSTOSCOPY WITH RETROGRADE PYELOGRAM/URETERAL RIGHT STENT PLACEMENT;  Surgeon: Erman Hayward, MD;  Location: [facility];  Service: Urology;  Laterality: Right;   CYSTOSCOPY W/ URETERAL STENT PLACEMENT Right 06/04/2017   Procedure: CYSTOSCOPY WITHRIGHT RETROGRADE  PYELOGRAMRIGHT /URETEREOSCOPY  STENT PLACEMENT;  Surgeon: Samson Croak, MD;  Location: [facility];  Service: Urology;  Laterality: Right;   ENDOMETRIAL ABLATION     ESOPHAGOGASTRODUODENOSCOPY (EGD) WITH PROPOFOL  N/A 08/16/2022   Procedure: ESOPHAGOGASTRODUODENOSCOPY (EGD) WITH PROPOFOL ;  Surgeon: Selena Daily, MD;  Location: [facility];  Service: Gastroenterology;  Laterality: N/A;   EYE SURGERY     HEMORRHOID SURGERY  2011   INCONTINENCE SURGERY     INNER EAR SURGERY     X 2   RECTAL PROLAPSE REPAIR     RETINAL DETACHMENT SURGERY Bilateral 2000   TOTAL KNEE ARTHROPLASTY Left 04/29/2019   Procedure: LEFT TOTAL KNEE ARTHROPLASTY;  Surgeon: Arnie Lao, MD;  Location: [facility];  Service: Orthopedics;  Laterality: Left;   TUBAL LIGATION     VEIN SURGERY Left 04/2010   Patient Active Problem List   Diagnosis Date Noted   Sore throat 04/30/2023   Acute non-recurrent maxillary sinusitis 04/30/2023   Cystitis 04/30/2023   History of DVT (deep vein thrombosis) 04/10/2023   Plaque psoriasis 04/10/2023   Right corneal abrasion 04/10/2023   Dysuria 10/18/2022   Multiple food allergies 10/12/2022  Chronic GERD 08/16/2022   Mild sleep apnea 07/26/2022   Prediabetes 04/13/2022   Palpitations 04/11/2022   Psoriasis of scalp 03/28/2022   Anemia due to vitamin B12 deficiency 03/28/2022   Reflux laryngitis 03/18/2022   Restless leg 03/18/2022   History of endometriosis 03/07/2022   Irritable bowel syndrome with both constipation and diarrhea 03/07/2022   Bronchiolitis 03/07/2022   Chronic fatigue 03/07/2022   Loud snoring 03/07/2022   Reactive airway disease 10/07/2020   Gastroesophageal reflux disease 04/13/2020   Chronic prescription benzodiazepine use 04/13/2020   Abnormal cervical Papanicolaou smear 03/31/2020   Pituitary microadenoma (HCC) 03/31/2020   Endometriosis 03/31/2020   H/O: hysterectomy 03/31/2020   Insomnia 02/03/2020   Pain in  female genitalia on intercourse 01/29/2020   Impingement syndrome of right shoulder 10/28/2018   Chronic pain of left knee 03/27/2018   Chronic right shoulder pain 03/27/2018   Cervicalgia 03/27/2018   Unilateral primary osteoarthritis, left knee 08/06/2017   Tremor 07/10/2017   Trochanteric bursitis, right hip 09/27/2016   GAD (generalized anxiety disorder) 05/31/2014   Rectocele 08/21/2011   Acute cough 10/25/2010   Hypertension 10/13/2010    PCP: Felicita Horns, FNP  REFERRING PROVIDER: Liam Redhead, MD  REFERRING DIAG: M25.561,M25.562,G89.29 (ICD-10-CM) - Chronic pain of both knees   THERAPY DIAG:  Muscle weakness (generalized)  Chronic pain of left knee  Other abnormalities of gait and mobility  Rationale for Evaluation and Treatment: Rehabilitation  ONSET DATE: Chronic, 2019  SUBJECTIVE:   SUBJECTIVE STATEMENT: Pt comes into PT using a single point cane with c/c of bilateral knee pain that she rates a 6/10 at best. Pt had a L TKA in 2020 which she did PT for but says she struggles with bending the L knee. Pt largest concern is inflammation and tightness. Describes knee pain as sharp, dull, achy, etc. Dependent on the time. Additionally has concerns with her left quad which she says cannot take any loading or pressure. Patient reports she bruises very easily and experiences a lot of pain. Challenges include transferring from the floor, stairs, squatting, walking, etc. Has worn orthotic inserts and wears ASICS or Brooks. Patient took pain medicine before treatment session which she takes 3 times a day on a regular basis. Patient wants to get her back looked at as well.   PERTINENT HISTORY: TKA 2020 Depression PAIN:  Are you having pain?  Yes: NPRS scale: 6/10  Worst: 10/10 Pain location: Both knees Pain description: sharp, dull, achy, etc. Aggravating factors: Everything Relieving factors: Pain medication   PRECAUTIONS: None  RED FLAGS: None   WEIGHT  BEARING RESTRICTIONS: No  FALLS:  Has patient fallen in last 6 months? No but reports losing balance a lot.   LIVING ENVIRONMENT: Lives with: lives with their partner Lives in: House/apartment Stairs: Yes, External, 3 stairs.  Has following equipment at home: Single point cane and Wheelchair (power)  OCCUPATION: None  PLOF: Independent  PATIENT GOALS: Wants to get back to a regular life. Cannot clean, cook because standing bothers her, play with her grand kids, etc.   OBJECTIVE:  Note: Objective measures were completed at Evaluation unless otherwise noted.  PATIENT SURVEYS:  FOTO 25%  COGNITION: Overall cognitive status: Within functional limits for tasks assessed     SENSATION: WFL Loss of sensation of lateral knee after TKA  EDEMA:  Notable swelling in left knee  MUSCLE LENGTH: Hamstrings: Both tight   POSTURE: rounded shoulders and forward head  PALPATION: Tight hamstrings, edema in knee cap,  tight gastroc.  LOWER EXTREMITY ROM:  Active ROM Right eval Left eval  Hip flexion    Hip extension    Hip abduction    Hip adduction    Hip internal rotation    Hip external rotation    Knee flexion 135 110  Knee extension -1 -1  Ankle dorsiflexion    Ankle plantarflexion    Ankle inversion    Ankle eversion     (Blank rows = not tested)  LOWER EXTREMITY MMT:  MMT Right eval Left eval  Hip flexion 4- 4-  Hip extension    Hip abduction 4- 3+  Hip adduction    Hip internal rotation    Hip external rotation    Knee flexion 4 4  Knee extension 4 4  Ankle dorsiflexion    Ankle plantarflexion    Ankle inversion    Ankle eversion     (Blank rows = not tested)  FUNCTIONAL TESTS:  30 seconds chair stand test: 9  GAIT: Distance walked: 20 ft Assistive device utilized: None Level of assistance: Complete Independence Comments: Limited DF, limited extension, limited hip flexion, lack of pressure through L LE.                                                                                                                           TREATMENT DATE: 07/04/23  Theraputic Exercise:  Supine QS 1x8  Supine SLR 1x8  Supine Gastroc Stretch 3x30" Supine hamstring stretch 3x30"   PATIENT EDUCATION:  Education details: Exam findings, POC, HEP.  Person educated: Patient Education method: Explanation, Demonstration, Tactile cues, Verbal cues, and Handouts Education comprehension: verbalized understanding, returned demonstration, and needs further education  HOME EXERCISE PROGRAM: Access Code: [email or web address]   ASSESSMENT:  CLINICAL IMPRESSION: Patient is a 58 y.o. female who was seen today for physical therapy evaluation and treatment for bilateral knee pain, more in the left knee than right. Patient exhibits weakness in the LE and tightness, particularly in the hamstring and calf. FOTO score indicates sharp decrease in subjective functional ability below PLOF. Patient would benefit from skilled PT to decrease pain and increase functional ability.   OBJECTIVE IMPAIRMENTS: Abnormal gait, decreased activity tolerance, decreased balance, decreased ROM, and decreased strength.   ACTIVITY LIMITATIONS: carrying, lifting, bending, sitting, standing, squatting, stairs, and transfers  PARTICIPATION LIMITATIONS: meal prep, cleaning, laundry, driving, and community activity  PERSONAL FACTORS: Past/current experiences, Time since onset of injury/illness/exacerbation, and 3+ comorbidities: see PMH above are also affecting patient's functional outcome.   REHAB POTENTIAL: Good  CLINICAL DECISION MAKING: Stable/uncomplicated  EVALUATION COMPLEXITY: Moderate   GOALS: Goals reviewed with patient? Yes  SHORT TERM GOALS: Target date: 08/01/2023  Patient will be I with initial HEP in order to progress with therapy. Baseline: Goal status: INITIAL  2.  Patient will score >/= 5/10 at worse on the NPS by 3rd visit in order to show an improvement in pain allowing for  an increase in functional ability.  Baseline: 6/10 best Goal status: INITIAL  LONG TERM GOALS: Target date: 08/29/2023   Patient will be independent with final HEP in order to continue treatment at home for pain tolerance.   Baseline:  Goal status: INITIAL  2.  Patient will score >/= 4/10 at worse to show an improvement in pain allowing for an increase in functional ability.  Baseline: 6/10 best Goal status: INITIAL  3.  Patient will score at least 45% on FOTO to signify clinically meaningful improvement in functional abilities.  Baseline: 25% Goal status: INITIAL  4.  Patient will get all MMT scores to a 4/5 on the L LE. Baseline: see above Goal status: INITIAL  5. Patient will increase 30 Second Sit to Stand rep count to no less than 11 reps for improved balance, strength, and functional mobility Baseline: 9 reps  Goal status: INITIAL   PLAN:  PT FREQUENCY: 1x/week  PT DURATION: 8 weeks  PLANNED INTERVENTIONS: 97110-Therapeutic exercises, 97530- Therapeutic activity, 97112- Neuromuscular re-education, 97535- Self Care, 60630- Manual therapy, [phone]- Gait training, [phone]- Vasopneumatic device, Patient/Family education, Balance training, Stair training, Joint mobilization, Joint manipulation, Spinal manipulation, Spinal mobilization, Cryotherapy, and Moist heat  PLAN FOR NEXT SESSION: Quad strength, knee extension, ankle dorsiflexion, look at back pain.    Jesse Moritz, Student-PT 07/04/2023, 4:43 PM

## 2023-07-04 ENCOUNTER — Ambulatory Visit: Payer: Medicare HMO | Attending: Physical Medicine and Rehabilitation

## 2023-07-04 ENCOUNTER — Other Ambulatory Visit: Payer: Self-pay

## 2023-07-04 DIAGNOSIS — M25562 Pain in left knee: Secondary | ICD-10-CM | POA: Insufficient documentation

## 2023-07-04 DIAGNOSIS — M5459 Other low back pain: Secondary | ICD-10-CM | POA: Diagnosis not present

## 2023-07-04 DIAGNOSIS — M25561 Pain in right knee: Secondary | ICD-10-CM | POA: Insufficient documentation

## 2023-07-04 DIAGNOSIS — M6281 Muscle weakness (generalized): Secondary | ICD-10-CM | POA: Diagnosis not present

## 2023-07-04 DIAGNOSIS — R2689 Other abnormalities of gait and mobility: Secondary | ICD-10-CM | POA: Insufficient documentation

## 2023-07-04 DIAGNOSIS — G8929 Other chronic pain: Secondary | ICD-10-CM | POA: Diagnosis not present

## 2023-07-04 NOTE — Patient Instructions (Signed)
 Access Code: [email or web address] URL: [email or web address] Date: 07/04/2023 Prepared by: Loral Roch  Exercises - Long Sitting Quad Set with Towel Roll Under Heel - 1 x daily - 7 x weekly - 2 sets - 10 reps - 5 sec hold - Active Straight Leg Raise with Quad Set (Mirrored) - 1 x daily - 7 x weekly - 3 sets - 10 reps - Long Sitting Calf Stretch with Strap - 1 x daily - 7 x weekly - 3 reps - 30 sec hold - Seated Hamstring Stretch - 1 x daily - 7 x weekly - 3 reps - 30 sec hold - Supine Hamstring Stretch with Strap - 1 x daily - 7 x weekly - 3 reps - 30 sdc hold

## 2023-07-10 ENCOUNTER — Other Ambulatory Visit: Payer: Self-pay

## 2023-07-10 ENCOUNTER — Ambulatory Visit: Payer: Medicare HMO

## 2023-07-10 DIAGNOSIS — M6281 Muscle weakness (generalized): Secondary | ICD-10-CM

## 2023-07-10 DIAGNOSIS — M25561 Pain in right knee: Secondary | ICD-10-CM | POA: Diagnosis not present

## 2023-07-10 DIAGNOSIS — G8929 Other chronic pain: Secondary | ICD-10-CM | POA: Diagnosis not present

## 2023-07-10 DIAGNOSIS — R2689 Other abnormalities of gait and mobility: Secondary | ICD-10-CM

## 2023-07-10 DIAGNOSIS — M25562 Pain in left knee: Secondary | ICD-10-CM | POA: Diagnosis not present

## 2023-07-10 DIAGNOSIS — M5459 Other low back pain: Secondary | ICD-10-CM | POA: Diagnosis not present

## 2023-07-10 NOTE — Therapy (Cosign Needed)
OUTPATIENT PHYSICAL THERAPY TREATMENT   Patient Name: Sharon [name] MRN: [id number] DOB:February 13, 1966, 58 y.o., female Today's Date: 07/11/2023  END OF SESSION:  PT End of Session - 07/10/23 1613     Visit Number 2    Number of Visits 9    Date for PT Re-Evaluation 08/29/23    Authorization Type [employer]    PT Start Time 1615    PT Stop Time 1655    PT Time Calculation (min) 40 min    Activity Tolerance Patient tolerated treatment well    Behavior During Therapy WFL for tasks assessed/performed              Past Medical History:  Diagnosis Date   Allergy    Anal fissure    Anemia    borderline   Anxiety    Arthritis    Blood clot in vein    left leg   Detached retina    BOTH SCLERA BUCKLE BOTH EYES   DVT (deep venous thrombosis) (HCC)    left leg   Endometriosis    Family history of adverse reaction to anesthesia    DAUGHTER POST OP PONV   GERD (gastroesophageal reflux disease)    Glaucoma    RIGHT WORST THAN LEFT   Heart murmur    "caused by anxiety"    History of hemorrhoids    History of kidney stones    Hypertension    IBS (irritable bowel syndrome)    Perforated eardrum, right    SMALL HOLE   PONV (postoperative nausea and vomiting)    Pulmonary embolism (HCC) 6 YRS AGO   Status post arthroscopy of left knee 08/06/2017   Past Surgical History:  Procedure Laterality Date   ABDOMINAL HYSTERECTOMY     still with both ovaries   BREAST BIOPSY Left 12/19/2022   [place] LT BREAST BX W LOC DEV 1ST LESION IMG BX SPEC US GUIDE 12/19/2022 [facility]   CATARACT EXTRACTION Bilateral 2015   COLONOSCOPY     CYSTOSCOPY W/ URETERAL STENT PLACEMENT Right 05/23/2017   Procedure: CYSTOSCOPY WITH RETROGRADE PYELOGRAM/URETERAL RIGHT STENT PLACEMENT;  Surgeon: Alfredo Martinez, MD;  Location: [facility];  Service: Urology;  Laterality: Right;   CYSTOSCOPY W/ URETERAL STENT PLACEMENT Right 06/04/2017   Procedure: CYSTOSCOPY WITHRIGHT RETROGRADE PYELOGRAMRIGHT  /URETEREOSCOPY  STENT PLACEMENT;  Surgeon: Crista Elliot, MD;  Location: [facility];  Service: Urology;  Laterality: Right;   ENDOMETRIAL ABLATION     ESOPHAGOGASTRODUODENOSCOPY (EGD) WITH PROPOFOL N/A 08/16/2022   Procedure: ESOPHAGOGASTRODUODENOSCOPY (EGD) WITH PROPOFOL;  Surgeon: Toney Reil, MD;  Location: [facility] ENDOSCOPY;  Service: Gastroenterology;  Laterality: N/A;   EYE SURGERY     HEMORRHOID SURGERY  2011   INCONTINENCE SURGERY     INNER EAR SURGERY     X 2   RECTAL PROLAPSE REPAIR     RETINAL DETACHMENT SURGERY Bilateral 2000   TOTAL KNEE ARTHROPLASTY Left 04/29/2019   Procedure: LEFT TOTAL KNEE ARTHROPLASTY;  Surgeon: Kathryne Hitch, MD;  Location: [facility];  Service: Orthopedics;  Laterality: Left;   TUBAL LIGATION     VEIN SURGERY Left 04/2010   Patient Active Problem List   Diagnosis Date Noted   Sore throat 04/30/2023   Acute non-recurrent maxillary sinusitis 04/30/2023   Cystitis 04/30/2023   History of DVT (deep vein thrombosis) 04/10/2023   Plaque psoriasis 04/10/2023   Right corneal abrasion 04/10/2023   Dysuria 10/18/2022   Multiple food allergies 10/12/2022  Chronic GERD 08/16/2022   Mild sleep apnea 07/26/2022   Prediabetes 04/13/2022   Palpitations 04/11/2022   Psoriasis of scalp 03/28/2022   Anemia due to vitamin B12 deficiency 03/28/2022   Reflux laryngitis 03/18/2022   Restless leg 03/18/2022   History of endometriosis 03/07/2022   Irritable bowel syndrome with both constipation and diarrhea 03/07/2022   Bronchiolitis 03/07/2022   Chronic fatigue 03/07/2022   Loud snoring 03/07/2022   Reactive airway disease 10/07/2020   Gastroesophageal reflux disease 04/13/2020   Chronic prescription benzodiazepine use 04/13/2020   Abnormal cervical Papanicolaou smear 03/31/2020   Pituitary microadenoma (HCC) 03/31/2020   Endometriosis 03/31/2020   H/O: hysterectomy 03/31/2020   Insomnia 02/03/2020   Pain in female genitalia  on intercourse 01/29/2020   Impingement syndrome of right shoulder 10/28/2018   Chronic pain of left knee 03/27/2018   Chronic right shoulder pain 03/27/2018   Cervicalgia 03/27/2018   Unilateral primary osteoarthritis, left knee 08/06/2017   Tremor 07/10/2017   Trochanteric bursitis, right hip 09/27/2016   GAD (generalized anxiety disorder) 05/31/2014   Rectocele 08/21/2011   Acute cough 10/25/2010   Hypertension 10/13/2010    PCP: Mort Sawyers, FNP  REFERRING PROVIDER: Horton Chin, MD  REFERRING DIAG: M25.561,M25.562,G89.29 (ICD-10-CM) - Chronic pain of both knees   THERAPY DIAG:  Muscle weakness (generalized)  Chronic pain of left knee  Other abnormalities of gait and mobility  Rationale for Evaluation and Treatment: Rehabilitation  ONSET DATE: Chronic, 2019  SUBJECTIVE:   SUBJECTIVE STATEMENT: Patient reports that her pain has not changed since last session. Reports a lot of quad tightness that bothers her when she walks and rests. She has been compliant with HEP at home.   EVAL: Pt comes into PT using a single point cane with c/c of bilateral knee pain that she rates a 6/10 at best. Pt had a L TKA in 2020 which she did PT for but says she struggles with bending the L knee. Pt largest concern is inflammation and tightness. Describes knee pain as sharp, dull, achy, etc. Dependent on the time. Additionally has concerns with her left quad which she says cannot take any loading or pressure. Patient reports she bruises very easily and experiences a lot of pain. Challenges include transferring from the floor, stairs, squatting, walking, etc. Has worn orthotic inserts and wears ASICS or Brooks. Patient took pain medicine before treatment session which she takes 3 times a day on a regular basis. Patient wants to get her back looked at as well.   PERTINENT HISTORY: TKA 2020 Depression PAIN:  Are you having pain?  Yes: NPRS scale: 6/10  Worst: 10/10 Pain location: Both  knees Pain description: sharp, dull, achy, etc. Aggravating factors: Everything Relieving factors: Pain medication   PRECAUTIONS: None  RED FLAGS: None   WEIGHT BEARING RESTRICTIONS: No  FALLS:  Has patient fallen in last 6 months? No but reports losing balance a lot.   LIVING ENVIRONMENT: Lives with: lives with their partner Lives in: House/apartment Stairs: Yes, External, 3 stairs.  Has following equipment at home: Single point cane and Wheelchair (power)  OCCUPATION: None  PLOF: Independent  PATIENT GOALS: Wants to get back to a regular life. Cannot clean, cook because standing bothers her, play with her grand kids, etc.   OBJECTIVE:  Note: Objective measures were completed at Evaluation unless otherwise noted.  PATIENT SURVEYS:  FOTO 25%  COGNITION: Overall cognitive status: Within functional limits for tasks assessed     SENSATION: WFL Loss of  sensation of lateral knee after TKA  EDEMA:  Notable swelling in left knee  MUSCLE LENGTH: Hamstrings: Both tight   POSTURE: rounded shoulders and forward head  PALPATION: Tight hamstrings, edema in knee cap, tight gastroc.  LOWER EXTREMITY ROM:  Active ROM Right eval Left eval  Hip flexion    Hip extension    Hip abduction    Hip adduction    Hip internal rotation    Hip external rotation    Knee flexion 135 110  Knee extension -1 -1  Ankle dorsiflexion    Ankle plantarflexion    Ankle inversion    Ankle eversion     (Blank rows = not tested)  LOWER EXTREMITY MMT:  MMT Right eval Left eval  Hip flexion 4- 4-  Hip extension    Hip abduction 4- 3+  Hip adduction    Hip internal rotation    Hip external rotation    Knee flexion 4 4  Knee extension 4 4  Ankle dorsiflexion    Ankle plantarflexion    Ankle inversion    Ankle eversion     (Blank rows = not tested)  FUNCTIONAL TESTS:  30 seconds chair stand test: 9  GAIT: Distance walked: 20 ft Assistive device utilized: None Level of  assistance: Complete Independence Comments: Limited DF, limited extension, limited hip flexion, lack of pressure through L LE.                                                                                                                          TREATMENT DATE:  07/10/23 Therapeutic Exercise:  NuStep L1 x 4 min Supine SLR 2x10 Sidelying hip abduction  Pelvic tilts 2x10  Supine core activation with pilates ring 2x10x3"  LAQ 2# 1x10x3"  Bridges 2x8 Standing TKE 2x10 red sport cord  Knee to wall dorsiflexion stretch 1x10x3"  Manual Therapy:  STM to quad  07/04/23  Theraputic Exercise:  Supine QS 1x8  Supine SLR 1x8  Supine Gastroc Stretch 3x30" Supine hamstring stretch 3x30"   PATIENT EDUCATION:  Education details: Exam findings, POC, HEP.  Person educated: Patient Education method: Explanation, Demonstration, Tactile cues, Verbal cues, and Handouts Education comprehension: verbalized understanding, returned demonstration, and needs further education  HOME EXERCISE PROGRAM: Access Code: [email or web address]   ASSESSMENT:  CLINICAL IMPRESSION: Patient arrives at PT today with no adverse effects from last session. She continues to complain of quad pain when walking. Continued to progress LE strengthening, focus on quad and hip adductor strength. Patient continues to notice muscle tightness during exercise. Patient would benefit from continued skilled PT to decrease pain and increase functional ability.  Patient is a 58 y.o. female who was seen today for physical therapy evaluation and treatment for bilateral knee pain, more in the left knee than right. Patient exhibits weakness in the LE and tightness, particularly in the hamstring and calf. FOTO score indicates sharp decrease in subjective functional ability below PLOF. Patient would benefit from skilled PT to  decrease pain and increase functional ability.   OBJECTIVE IMPAIRMENTS: Abnormal gait, decreased activity tolerance, decreased  balance, decreased ROM, and decreased strength.   ACTIVITY LIMITATIONS: carrying, lifting, bending, sitting, standing, squatting, stairs, and transfers  PARTICIPATION LIMITATIONS: meal prep, cleaning, laundry, driving, and community activity  PERSONAL FACTORS: Past/current experiences, Time since onset of injury/illness/exacerbation, and 3+ comorbidities: see PMH above are also affecting patient's functional outcome.   REHAB POTENTIAL: Good  CLINICAL DECISION MAKING: Stable/uncomplicated  EVALUATION COMPLEXITY: Moderate   GOALS: Goals reviewed with patient? Yes  SHORT TERM GOALS: Target date: 08/01/2023  Patient will be I with initial HEP in order to progress with therapy. Baseline: Goal status: INITIAL  2.  Patient will score >/= 5/10 at worse on the NPS by 3rd visit in order to show an improvement in pain allowing for an increase in functional ability.   Baseline: 6/10 best Goal status: INITIAL  LONG TERM GOALS: Target date: 08/29/2023   Patient will be independent with final HEP in order to continue treatment at home for pain tolerance.   Baseline:  Goal status: INITIAL  2.  Patient will score >/= 4/10 at worse to show an improvement in pain allowing for an increase in functional ability.  Baseline: 6/10 best Goal status: INITIAL  3.  Patient will score at least 45% on FOTO to signify clinically meaningful improvement in functional abilities.  Baseline: 25% Goal status: INITIAL  4.  Patient will get all MMT scores to a 4/5 on the L LE. Baseline: see above Goal status: INITIAL  5. Patient will increase 30 Second Sit to Stand rep count to no less than 11 reps for improved balance, strength, and functional mobility Baseline: 9 reps  Goal status: INITIAL   PLAN:  PT FREQUENCY: 1x/week  PT DURATION: 8 weeks  PLANNED INTERVENTIONS: 97110-Therapeutic exercises, 97530- Therapeutic activity, 97112- Neuromuscular re-education, 97535- Self Care, [id number]- Manual therapy,  [phone]- Gait training, [phone]- Vasopneumatic device, Patient/Family education, Balance training, Stair training, Joint mobilization, Joint manipulation, Spinal manipulation, Spinal mobilization, Cryotherapy, and Moist heat  PLAN FOR NEXT SESSION: Continue working on strengthening of the quad, look at lower back next session.    Erin Hearing, Student-PT 07/11/2023, 8:56 AM

## 2023-07-10 NOTE — Progress Notes (Unsigned)
Virtual Visit via Video Note  Consent was obtained for video visit:  Yes.   Answered questions that patient had about telehealth interaction:  Yes.   I discussed the limitations, risks, security and privacy concerns of performing an evaluation and management service by telemedicine. I also discussed with the patient that there may be a patient responsible charge related to this service. The patient expressed understanding and agreed to proceed.  Pt location: Home Physician Location: office Name of referring provider:  Mort Sawyers, FNP I connected with [name] at patients initiation/request on 07/11/2023 at  2:10 PM EST by video enabled telemedicine application and verified that I am speaking with the correct person using two identifiers. Pt MRN:  [id number] Pt DOB:  1966/04/17 Video Participants:  [name];    Assessment/Plan:   Essential tremor, stable   1 Primidone 50mg  in AM and 150mg  at bedtime 2.Follow up 1 year   Subjective:  Sharon [name] is a 58 year old female with generalized anxiety disorder, restless leg syndrome, hypertension, glaucoma, arthritis, chronic pain and history of PE and DVT in left leg who follows up for essential tremor   UPDATE: Medications:  Primidone 50mg  in AM and 150mg  at bedtime, amitriptyline 10mg  QHS (not taking it), gabapentin 600mg  BID and 600-800mg  QHS, trazodone 50mg  at bedtime PRN, nebivolol 5mg  daily   Increased bedtime primidone from 100mg  to 150mg .  Overall, she thinks she is doing better.  Every now and then, she feels a slight internal tremor.  Sometimes drops items.    She is followed by pain management for her chronic neck pain.  Going to start PT.   HISTORY: She started having tremors around 2015.  It involves both hands.  It interferes with daily tasks.  She drops a lot.  She has occasional trouble with utensils or writing.  Nothing particular makes it worse.  Steadying her wrist on the arm rest will  suppress it.     Her mother and grandmother had tremor.   She has chronic generalized pain as well as neck pain.  She reports a creepy crawly feeling under her skin, including back and down the legs.  She has been diagnosed with restless leg syndrome.  MRI of cervical spine from 04/12/18 was personally reviewed and showed cervical spondylosis, degenerative disc disease and congenitally short pedicles, causing mild impingement at C3-4 on left, C6-7 on right and C7-T1 on left.  CT of right shoulder on 04/11/18 showed severe supraspinatus and infraspinatus tendinopathy without tear as well as small type 2 SLAP tear.  She notes a 'pinching" pain radiating down the lateral arm and into the fingers with associated numbness and tingling in the fingers.  MRI lumbar spine from 04/13/18 showed L4-L5 right foraminal disc protrusion that may be causing right L4 nerve root irritation.  L5-S1 moderate face arthrosis and small central protrusion with annular fissure.  She underwent lab work on 03/04/2019 to evaluate chronic pain.  ANA was negative, RF negative, sed rate 5, CRP <0.1, CK 49.  B12 from 03/28/2022 was 553 and TSH was 1.80.  Hgb A1c from 07/11/2022 was 5.5.    She is dealing with increased stressors..  Reports short term memory problems.  For example, she always has to always use GPS everywhere.   Past medications:  Cymbalta (worsened tremor)    Past Medical History: Past Medical History:  Diagnosis Date   Allergy    Anal fissure    Anemia    borderline  Anxiety    Arthritis    Blood clot in vein    left leg   Detached retina    BOTH SCLERA BUCKLE BOTH EYES   DVT (deep venous thrombosis) (HCC)    left leg   Endometriosis    Family history of adverse reaction to anesthesia    DAUGHTER POST OP PONV   GERD (gastroesophageal reflux disease)    Glaucoma    RIGHT WORST THAN LEFT   Heart murmur    "caused by anxiety"    History of hemorrhoids    History of kidney stones    Hypertension     IBS (irritable bowel syndrome)    Perforated eardrum, right    SMALL HOLE   PONV (postoperative nausea and vomiting)    Pulmonary embolism (HCC) 6 YRS AGO   Status post arthroscopy of left knee 08/06/2017    Medications: Outpatient Encounter Medications as of 07/11/2023  Medication Sig Note   Acetylcysteine (NAC) 600 MG CAPS Take 600 mg by mouth 2 (two) times daily.    acyclovir (ZOVIRAX) 400 MG tablet Take 1 tablet by mouth daily. 12/04/2022: As needed   albuterol (VENTOLIN HFA) 108 (90 Base) MCG/ACT inhaler INHALE 1-2 PUFFS BY MOUTH EVERY 6 HOURS AS NEEDED FOR WHEEZE OR SHORTNESS OF BREATH    benzonatate (TESSALON) 100 MG capsule Take 1 capsule (100 mg total) by mouth 3 (three) times daily as needed for cough.    bimatoprost (LUMIGAN) 0.03 % ophthalmic solution Place 1 drop into both eyes at bedtime.    busPIRone (BUSPAR) 5 MG tablet TAKE 1 TABLET BY MOUTH THREE TIMES A DAY    cefdinir (OMNICEF) 300 MG capsule Take 1 capsule (300 mg total) by mouth 2 (two) times daily.    Cholecalciferol (VITAMIN D) 50 MCG (2000 UT) tablet 1 tablet    clobetasol (TEMOVATE) 0.05 % external solution APPLY TO AFFECTED AREA TWICE A DAY    desonide (DESOWEN) 0.05 % cream Apply topically 2 (two) times daily.    EPINEPHrine 0.3 mg/0.3 mL IJ SOAJ injection Inject 0.3 mg into the muscle as needed for anaphylaxis.    estradiol (ESTRACE) 0.1 MG/GM vaginal cream Insert pea-sized amount nightly for 2 weeks then every other night    famotidine (PEPCID) 40 MG tablet Take 1 tablet (40 mg total) by mouth daily.    gabapentin (NEURONTIN) 100 MG capsule Take 2 capsules (200 mg total) by mouth at bedtime.    gabapentin (NEURONTIN) 600 MG tablet Take 1 tablet (600 mg total) by mouth 3 (three) times daily.    lidocaine (LIDODERM) 5 % Apply 1 patch topically daily.    LUMIGAN 0.01 % SOLN 1 drop at bedtime.    meloxicam (MOBIC) 15 MG tablet Take 1 tablet (15 mg total) by mouth daily.    methocarbamol (ROBAXIN) 500 MG tablet Take  1 tablet (500 mg total) by mouth every 8 (eight) hours as needed for muscle spasms.    mometasone (NASONEX) 50 MCG/ACT nasal spray Place 2 sprays into the nose daily.    nebivolol (BYSTOLIC) 5 MG tablet TAKE 1 TABLET BY MOUTH EVERY DAY    nitroGLYCERIN (NITROSTAT) 0.4 MG SL tablet Place 1 tablet (0.4 mg total) under the tongue every 5 (five) minutes as needed for chest pain.    Nitroglycerin (RECTIV) 0.4 % OINT Place 0.4 inches rectally 2 (two) times daily as needed (For anal fissures.).    ondansetron (ZOFRAN-ODT) 4 MG disintegrating tablet 1 tablet on the tongue and allow to  dissolve Orally Once a day for 30 day(s)    pantoprazole (PROTONIX) 20 MG tablet Take 1 tablet (20 mg total) by mouth daily.    pantoprazole (PROTONIX) 40 MG tablet TAKE 1 TABLET (40 MG TOTAL) BY MOUTH TWICE A DAY BEFORE MEALS    polyethylene glycol powder (GLYCOLAX/MIRALAX) 17 GM/SCOOP powder See admin instructions.    traZODone (DESYREL) 50 MG tablet Take 1 tablet (50 mg total) by mouth at bedtime as needed for sleep.    triamcinolone cream (KENALOG) 0.1 % Apply to affected areas hands twice daily until improved, then as needed for recurrence. Avoid applying to face, groin, and axilla. Use as directed. Long-term use can cause thinning of the skin.    vitamin B-12 (CYANOCOBALAMIN) 1000 MCG tablet Take 1 tablet (1,000 mcg total) by mouth daily.    Zinc 50 MG TABS 1 tablet Orally Once a day for 30 day(s)    [DISCONTINUED] primidone (MYSOLINE) 50 MG tablet Take 1 tablet in morning and 3 tablets at bedtime.    primidone (MYSOLINE) 50 MG tablet Take 1 tablet in morning and 3 tablets at bedtime.    [DISCONTINUED] primidone (MYSOLINE) 50 MG tablet TAKE 1 TABLET IN MORNING AND 3 TABLETS AT BEDTIME    No facility-administered encounter medications on file as of 07/11/2023.    Allergies: Allergies  Allergen Reactions   Bactrim [Sulfamethoxazole-Trimethoprim] Shortness Of Breath   Bee Venom Anaphylaxis and Other (See Comments)    Penicillins Other (See Comments) and Swelling    Reaction unknown from childhood Has patient had a PCN reaction causing immediate rash, facial/tongue/throat swelling, SOB or lightheadedness with hypotension: unknown Has patient had a PCN reaction causing severe rash involving mucus membranes or skin necrosis: unknown  Has patient had a PCN reaction that required hospitalization: no Has patient had a PCN reaction occurring within the last 10 years: no If all of the above answers are "NO", then may proceed with Cephalosporin use.  Other reaction(s): Other, Unknown Throat swells Throat swells Reaction unknown from childhood Has patient had a PCN reaction causing immediate rash, facial/tongue/throat swelling, SOB or lightheadedness with hypotension: unknown Has patient had a PCN reaction causing severe rash involving mucus membranes or skin necrosis: unknown  Has patient had a PCN reaction that required hospitalization: no Has patient had a PCN reaction occurring within the last 10 years: no If all of the above answers are "NO", then may proceed with Cephalosporin use.  Other reaction(s): Not available   Dextromethorphan     Other reaction(s): Lump in throat Other reaction(s): Lump in throat   Elavil [Amitriptyline] Other (See Comments)    Dry mouth   Hydrocodone-Acetaminophen Other (See Comments)   Oxycodone Other (See Comments)   Penicillin G     Other reaction(s): Unknown   Dextromethorphan Polistirex Er Other (See Comments)    Pt states caused "a lump" in her throat, making it difficult to swallow   Fluconazole     Primidone interaction Other reaction(s): Primidone interaction Other reaction(s): Primidone interaction   Hydrocodone Nausea And Vomiting and Nausea Only    Other reaction(s): nausea and vomiting Other reaction(s): nausea and vomiting   Oxycodone-Acetaminophen Other (See Comments)    Made fingers tingle and go numb, started "feeling weird".  Other reaction(s):  Other Made fingers tingle and go numb, started "feeling weird".  Other reaction(s): tingle/numb fingers Other reaction(s): tingle/numb fingers   Tape Itching and Other (See Comments)    Bandaids - leaves red marks  Other reaction(s): itching  Family History: Family History  Problem Relation Age of Onset   Emphysema Mother        smoker   Allergies Mother    Asthma Mother    Heart disease Mother        AMI as cause of death   COPD Mother    Asthma Father    Heart disease Father        AMI as cause of death   Diabetes Father    Asthma Brother    Heart disease Brother 31       heart failure   Lung cancer Maternal Grandfather        was a smoker   Cancer Maternal Grandfather        lung   Heart disease Paternal Grandmother        AMI   Heart disease Paternal Grandfather        AMI   Colon cancer Neg Hx     Observations/Objective:   No acute distress.  Alert and oriented.  Speech fluent and not dysarthric.  Language intact.     Follow Up Instructions:    -I discussed the assessment and treatment plan with the patient. The patient was provided an opportunity to ask questions and all were answered. The patient agreed with the plan and demonstrated an understanding of the instructions.   The patient was advised to call back or seek an in-person evaluation if the symptoms worsen or if the condition fails to improve as anticipated.   Cira Servant, DO  CC: Gweneth Dimitri, MD

## 2023-07-11 ENCOUNTER — Other Ambulatory Visit: Payer: Self-pay | Admitting: Physical Medicine and Rehabilitation

## 2023-07-11 ENCOUNTER — Encounter: Payer: Self-pay | Admitting: Neurology

## 2023-07-11 ENCOUNTER — Telehealth (INDEPENDENT_AMBULATORY_CARE_PROVIDER_SITE_OTHER): Payer: Medicare HMO | Admitting: Neurology

## 2023-07-11 DIAGNOSIS — G250 Essential tremor: Secondary | ICD-10-CM | POA: Diagnosis not present

## 2023-07-11 MED ORDER — PRIMIDONE 50 MG PO TABS: ORAL_TABLET | ORAL | 3 refills | Status: AC

## 2023-07-14 ENCOUNTER — Other Ambulatory Visit: Payer: Self-pay | Admitting: Physical Medicine and Rehabilitation

## 2023-07-14 DIAGNOSIS — M545 Low back pain, unspecified: Secondary | ICD-10-CM

## 2023-07-16 DIAGNOSIS — G4733 Obstructive sleep apnea (adult) (pediatric): Secondary | ICD-10-CM | POA: Diagnosis not present

## 2023-07-18 ENCOUNTER — Ambulatory Visit: Payer: Medicare HMO

## 2023-07-18 DIAGNOSIS — M5459 Other low back pain: Secondary | ICD-10-CM

## 2023-07-18 DIAGNOSIS — R2689 Other abnormalities of gait and mobility: Secondary | ICD-10-CM | POA: Diagnosis not present

## 2023-07-18 DIAGNOSIS — M6281 Muscle weakness (generalized): Secondary | ICD-10-CM | POA: Diagnosis not present

## 2023-07-18 DIAGNOSIS — M25561 Pain in right knee: Secondary | ICD-10-CM | POA: Diagnosis not present

## 2023-07-18 DIAGNOSIS — G8929 Other chronic pain: Secondary | ICD-10-CM

## 2023-07-18 DIAGNOSIS — M25562 Pain in left knee: Secondary | ICD-10-CM | POA: Diagnosis not present

## 2023-07-18 DIAGNOSIS — H401131 Primary open-angle glaucoma, bilateral, mild stage: Secondary | ICD-10-CM | POA: Diagnosis not present

## 2023-07-18 NOTE — Therapy (Signed)
OUTPATIENT PHYSICAL THERAPY TREATMENT   Patient Name: Sharon [name] MRN: [id number] DOB:1966/02/20, 58 y.o., female Today's Date: 07/19/2023  END OF SESSION:  PT End of Session - 07/18/23 1404     Visit Number 3    Number of Visits 9    Date for PT Re-Evaluation 08/29/23    Authorization Type [employer]    PT Start Time 1401    PT Stop Time 1443    PT Time Calculation (min) 42 min    Activity Tolerance Patient tolerated treatment well    Behavior During Therapy WFL for tasks assessed/performed               Past Medical History:  Diagnosis Date   Allergy    Anal fissure    Anemia    borderline   Anxiety    Arthritis    Blood clot in vein    left leg   Detached retina    BOTH SCLERA BUCKLE BOTH EYES   DVT (deep venous thrombosis) (HCC)    left leg   Endometriosis    Family history of adverse reaction to anesthesia    DAUGHTER POST OP PONV   GERD (gastroesophageal reflux disease)    Glaucoma    RIGHT WORST THAN LEFT   Heart murmur    "caused by anxiety"    History of hemorrhoids    History of kidney stones    Hypertension    IBS (irritable bowel syndrome)    Perforated eardrum, right    SMALL HOLE   PONV (postoperative nausea and vomiting)    Pulmonary embolism (HCC) 6 YRS AGO   Status post arthroscopy of left knee 08/06/2017   Past Surgical History:  Procedure Laterality Date   ABDOMINAL HYSTERECTOMY     still with both ovaries   BREAST BIOPSY Left 12/19/2022   [place] LT BREAST BX W LOC DEV 1ST LESION IMG BX SPEC US GUIDE 12/19/2022 [facility]   CATARACT EXTRACTION Bilateral 2015   COLONOSCOPY     CYSTOSCOPY W/ URETERAL STENT PLACEMENT Right 05/23/2017   Procedure: CYSTOSCOPY WITH RETROGRADE PYELOGRAM/URETERAL RIGHT STENT PLACEMENT;  Surgeon: Alfredo Martinez, MD;  Location: [facility];  Service: Urology;  Laterality: Right;   CYSTOSCOPY W/ URETERAL STENT PLACEMENT Right 06/04/2017   Procedure: CYSTOSCOPY WITHRIGHT RETROGRADE PYELOGRAMRIGHT  /URETEREOSCOPY  STENT PLACEMENT;  Surgeon: Crista Elliot, MD;  Location: [facility];  Service: Urology;  Laterality: Right;   ENDOMETRIAL ABLATION     ESOPHAGOGASTRODUODENOSCOPY (EGD) WITH PROPOFOL N/A 08/16/2022   Procedure: ESOPHAGOGASTRODUODENOSCOPY (EGD) WITH PROPOFOL;  Surgeon: Toney Reil, MD;  Location: [facility];  Service: Gastroenterology;  Laterality: N/A;   EYE SURGERY     HEMORRHOID SURGERY  2011   INCONTINENCE SURGERY     INNER EAR SURGERY     X 2   RECTAL PROLAPSE REPAIR     RETINAL DETACHMENT SURGERY Bilateral 2000   TOTAL KNEE ARTHROPLASTY Left 04/29/2019   Procedure: LEFT TOTAL KNEE ARTHROPLASTY;  Surgeon: Kathryne Hitch, MD;  Location: [facility];  Service: Orthopedics;  Laterality: Left;   TUBAL LIGATION     VEIN SURGERY Left 04/2010   Patient Active Problem List   Diagnosis Date Noted   Sore throat 04/30/2023   Acute non-recurrent maxillary sinusitis 04/30/2023   Cystitis 04/30/2023   History of DVT (deep vein thrombosis) 04/10/2023   Plaque psoriasis 04/10/2023   Right corneal abrasion 04/10/2023   Dysuria 10/18/2022   Multiple food allergies 10/12/2022  Chronic GERD 08/16/2022   Mild sleep apnea 07/26/2022   Prediabetes 04/13/2022   Palpitations 04/11/2022   Psoriasis of scalp 03/28/2022   Anemia due to vitamin B12 deficiency 03/28/2022   Reflux laryngitis 03/18/2022   Restless leg 03/18/2022   History of endometriosis 03/07/2022   Irritable bowel syndrome with both constipation and diarrhea 03/07/2022   Bronchiolitis 03/07/2022   Chronic fatigue 03/07/2022   Loud snoring 03/07/2022   Reactive airway disease 10/07/2020   Gastroesophageal reflux disease 04/13/2020   Chronic prescription benzodiazepine use 04/13/2020   Abnormal cervical Papanicolaou smear 03/31/2020   Pituitary microadenoma (HCC) 03/31/2020   Endometriosis 03/31/2020   H/O: hysterectomy 03/31/2020   Insomnia 02/03/2020   Pain in female genitalia  on intercourse 01/29/2020   Impingement syndrome of right shoulder 10/28/2018   Chronic pain of left knee 03/27/2018   Chronic right shoulder pain 03/27/2018   Cervicalgia 03/27/2018   Unilateral primary osteoarthritis, left knee 08/06/2017   Tremor 07/10/2017   Trochanteric bursitis, right hip 09/27/2016   GAD (generalized anxiety disorder) 05/31/2014   Rectocele 08/21/2011   Acute cough 10/25/2010   Hypertension 10/13/2010    PCP: Mort Sawyers, FNP  REFERRING PROVIDER: Horton Chin, MD  REFERRING DIAG: M25.561,M25.562,G89.29 (ICD-10-CM) - Chronic pain of both knees   THERAPY DIAG:  Muscle weakness (generalized)  Chronic pain of left knee  Other abnormalities of gait and mobility  Other low back pain  Rationale for Evaluation and Treatment: Rehabilitation  ONSET DATE: Chronic, 2019  SUBJECTIVE:   SUBJECTIVE STATEMENT: Pt presents to PT with reports of continued pain in lower back and R knee. Has continued HEP compliance with no adverse effect. Has had continued occasional N/T down R LE. MD has added referral for LBP.   EVAL: Pt comes into PT using a single point cane with c/c of bilateral knee pain that she rates a 6/10 at best. Pt had a L TKA in 2020 which she did PT for but says she struggles with bending the L knee. Pt largest concern is inflammation and tightness. Describes knee pain as sharp, dull, achy, etc. Dependent on the time. Additionally has concerns with her left quad which she says cannot take any loading or pressure. Patient reports she bruises very easily and experiences a lot of pain. Challenges include transferring from the floor, stairs, squatting, walking, etc. Has worn orthotic inserts and wears ASICS or Brooks. Patient took pain medicine before treatment session which she takes 3 times a day on a regular basis. Patient wants to get her back looked at as well.   PERTINENT HISTORY: TKA 2020 Depression PAIN:  Are you having pain?  Yes: NPRS  scale: 6/10  Worst: 10/10 Pain location: Both knees Pain description: sharp, dull, achy, etc. Aggravating factors: Everything Relieving factors: Pain medication   PRECAUTIONS: None  RED FLAGS: None   WEIGHT BEARING RESTRICTIONS: No  FALLS:  Has patient fallen in last 6 months? No but reports losing balance a lot.   LIVING ENVIRONMENT: Lives with: lives with their partner Lives in: House/apartment Stairs: Yes, External, 3 stairs.  Has following equipment at home: Single point cane and Wheelchair (power)  OCCUPATION: None  PLOF: Independent  PATIENT GOALS: Wants to get back to a regular life. Cannot clean, cook because standing bothers her, play with her grand kids, etc.   OBJECTIVE:  Note: Objective measures were completed at Evaluation unless otherwise noted.  PATIENT SURVEYS:  FOTO 25% ODI: 34/50 - 68% disability  COGNITION: Overall cognitive status:  Within functional limits for tasks assessed     SENSATION: WFL Loss of sensation of lateral knee after TKA  EDEMA:  Notable swelling in left knee  MUSCLE LENGTH: Hamstrings: Both tight   POSTURE: rounded shoulders and forward head  PALPATION: Tight hamstrings, edema in knee cap, tight gastroc.  LOWER EXTREMITY ROM:  Active ROM Right eval Left eval  Hip flexion    Hip extension    Hip abduction    Hip adduction    Hip internal rotation    Hip external rotation    Knee flexion 135 110  Knee extension -1 -1  Ankle dorsiflexion    Ankle plantarflexion    Ankle inversion    Ankle eversion     (Blank rows = not tested)  LOWER EXTREMITY MMT:  MMT Right eval Left eval  Hip flexion 4- 4-  Hip extension    Hip abduction 4- 3+  Hip adduction    Hip internal rotation    Hip external rotation    Knee flexion 4 4  Knee extension 4 4  Ankle dorsiflexion    Ankle plantarflexion    Ankle inversion    Ankle eversion     (Blank rows = not tested)  FUNCTIONAL TESTS:  30 seconds chair stand test:  9  GAIT: Distance walked: 20 ft Assistive device utilized: None Level of assistance: Complete Independence Comments: Limited DF, limited extension, limited hip flexion, lack of pressure through L LE.                                                                                                                          TREATMENT DATE:  07/18/23 Therapeutic Exercise:  NuStep L5 x 4 min Supine QS x 10 - 5" hold Supine pilates SLR 2x10 Pilates ring bridge 2x10 Supine PPT x 10 - 5" hold Supine PPT with ball 2x10  S/L hip abd x 15 each Modified crunch into physioball 2x10 - 3" hold TKE into ball x 15 L   07/10/23 Therapeutic Exercise:  NuStep L1 x 4 min Supine SLR 2x10 Sidelying hip abduction  Pelvic tilts 2x10  Supine core activation with pilates ring 2x10x3"  LAQ 2# 1x10x3"  Bridges 2x8 Standing TKE 2x10 red sport cord  Knee to wall dorsiflexion stretch 1x10x3"  Manual Therapy:  STM to quad  07/04/23  Theraputic Exercise:  Supine QS 1x8  Supine SLR 1x8  Supine Gastroc Stretch 3x30" Supine hamstring stretch 3x30"   PATIENT EDUCATION:  Education details: Exam findings, POC, HEP.  Person educated: Patient Education method: Explanation, Demonstration, Tactile cues, Verbal cues, and Handouts Education comprehension: verbalized understanding, returned demonstration, and needs further education  HOME EXERCISE PROGRAM: Access Code: [email or web address]   ASSESSMENT:  CLINICAL IMPRESSION: Pt was able to complete all prescribed exercises with no adverse effect. Therapy today focused on improving core and LE strength in order to improve functional mobility and decrease pain. ODI score demonstrates she is operating at severe disability for her LBP  in performance of home ADLs and community activities. Pt would continue to benefit from skilled PT services working on improving core and LE strength in order to decrease pain.   EVAL: Patient is a 58 y.o. female who was seen today for physical  therapy evaluation and treatment for bilateral knee pain, more in the left knee than right. Patient exhibits weakness in the LE and tightness, particularly in the hamstring and calf. FOTO score indicates sharp decrease in subjective functional ability below PLOF. Patient would benefit from skilled PT to decrease pain and increase functional ability.   OBJECTIVE IMPAIRMENTS: Abnormal gait, decreased activity tolerance, decreased balance, decreased ROM, and decreased strength.   ACTIVITY LIMITATIONS: carrying, lifting, bending, sitting, standing, squatting, stairs, and transfers  PARTICIPATION LIMITATIONS: meal prep, cleaning, laundry, driving, and community activity  PERSONAL FACTORS: Past/current experiences, Time since onset of injury/illness/exacerbation, and 3+ comorbidities: see PMH above are also affecting patient's functional outcome.   REHAB POTENTIAL: Good  CLINICAL DECISION MAKING: Stable/uncomplicated  EVALUATION COMPLEXITY: Moderate   GOALS: Goals reviewed with patient? Yes  SHORT TERM GOALS: Target date: 08/01/2023  Patient will be I with initial HEP in order to progress with therapy. Baseline: Goal status: MET  2.  Patient will score >/= 5/10 at worse on the NPS by 3rd visit in order to show an improvement in pain allowing for an increase in functional ability.   Baseline: 6/10 best Goal status: INITIAL  LONG TERM GOALS: Target date: 08/29/2023   Patient will be independent with final HEP in order to continue treatment at home for pain tolerance.   Baseline:  Goal status: INITIAL  2.  Patient will score >/= 4/10 at worse to show an improvement in pain allowing for an increase in functional ability.  Baseline: 6/10 best Goal status: INITIAL  3.  Patient will score at least 45% on FOTO to signify clinically meaningful improvement in functional abilities.  Baseline: 25% Goal status: INITIAL  4.  Patient will get all MMT scores to a 4/5 on the L LE. Baseline:  see above Goal status: INITIAL  5. Patient will increase 30 Second Sit to Stand rep count to no less than 11 reps for improved balance, strength, and functional mobility Baseline: 9 reps  Goal status: INITIAL   6. Pt will be decrease ODI disability score to no greater than 54% as proxy for functional improvement Baseline: 68% disability    Goal status: INITIAL   PLAN:  PT FREQUENCY: 1x/week  PT DURATION: 8 weeks  PLANNED INTERVENTIONS: 97110-Therapeutic exercises, 97530- Therapeutic activity, 97112- Neuromuscular re-education, 97535- Self Care, 16109- Manual therapy, [phone]- Gait training, [phone]- Vasopneumatic device, Patient/Family education, Balance training, Stair training, Joint mobilization, Joint manipulation, Spinal manipulation, Spinal mobilization, Cryotherapy, and Moist heat  PLAN FOR NEXT SESSION: Continue working on strengthening of the quad, look at lower back next session.    Eloy End PT  07/19/23 9:16 AM

## 2023-07-19 DIAGNOSIS — G4733 Obstructive sleep apnea (adult) (pediatric): Secondary | ICD-10-CM | POA: Diagnosis not present

## 2023-07-25 ENCOUNTER — Ambulatory Visit: Payer: Medicare HMO | Attending: Physical Medicine and Rehabilitation

## 2023-07-25 DIAGNOSIS — R2689 Other abnormalities of gait and mobility: Secondary | ICD-10-CM | POA: Diagnosis not present

## 2023-07-25 DIAGNOSIS — M5459 Other low back pain: Secondary | ICD-10-CM | POA: Insufficient documentation

## 2023-07-25 DIAGNOSIS — M25562 Pain in left knee: Secondary | ICD-10-CM | POA: Insufficient documentation

## 2023-07-25 DIAGNOSIS — G8929 Other chronic pain: Secondary | ICD-10-CM | POA: Diagnosis not present

## 2023-07-25 DIAGNOSIS — M6281 Muscle weakness (generalized): Secondary | ICD-10-CM | POA: Insufficient documentation

## 2023-07-25 NOTE — Therapy (Signed)
 OUTPATIENT PHYSICAL THERAPY TREATMENT   Patient Name: Sharon [name] MRN: [id number] DOB:[birth year], 58 y.o., female Today's Date: 07/25/2023  END OF SESSION:  PT End of Session - 07/25/23 1405     Visit Number 4    Number of Visits 9    Date for PT Re-Evaluation 08/29/23    Authorization Type [employer]    PT Start Time 1400    PT Stop Time 1445    PT Time Calculation (min) 45 min    Activity Tolerance Patient tolerated treatment well    Behavior During Therapy WFL for tasks assessed/performed                Past Medical History:  Diagnosis Date   Allergy     Anal fissure    Anemia    borderline   Anxiety    Arthritis    Blood clot in vein    left leg   Detached retina    BOTH SCLERA BUCKLE BOTH EYES   DVT (deep venous thrombosis) (HCC)    left leg   Endometriosis    Family history of adverse reaction to anesthesia    DAUGHTER POST OP PONV   GERD (gastroesophageal reflux disease)    Glaucoma    RIGHT WORST THAN LEFT   Heart murmur    caused by anxiety    History of hemorrhoids    History of kidney stones    Hypertension    IBS (irritable bowel syndrome)    Perforated eardrum, right    SMALL HOLE   PONV (postoperative nausea and vomiting)    Pulmonary embolism (HCC) 6 YRS AGO   Status post arthroscopy of left knee 08/06/2017   Past Surgical History:  Procedure Laterality Date   ABDOMINAL HYSTERECTOMY     still with both ovaries   BREAST BIOPSY Left 12/19/2022   US  LT BREAST BX W LOC DEV 1ST LESION IMG BX SPEC US  GUIDE 12/19/2022 [facility]   CATARACT EXTRACTION Bilateral 2015   COLONOSCOPY     CYSTOSCOPY W/ URETERAL STENT PLACEMENT Right 05/23/2017   Procedure: CYSTOSCOPY WITH RETROGRADE PYELOGRAM/URETERAL RIGHT STENT PLACEMENT;  Surgeon: Gaston Hamilton, MD;  Location: [facility];  Service: Urology;  Laterality: Right;   CYSTOSCOPY W/ URETERAL STENT PLACEMENT Right 06/04/2017   Procedure: CYSTOSCOPY WITHRIGHT RETROGRADE PYELOGRAMRIGHT  /URETEREOSCOPY  STENT PLACEMENT;  Surgeon: Carolee Sherwood JONETTA DOUGLAS, MD;  Location: [facility];  Service: Urology;  Laterality: Right;   ENDOMETRIAL ABLATION     ESOPHAGOGASTRODUODENOSCOPY (EGD) WITH PROPOFOL  N/A 08/16/2022   Procedure: ESOPHAGOGASTRODUODENOSCOPY (EGD) WITH PROPOFOL ;  Surgeon: Unk Corinn Skiff, MD;  Location: [facility];  Service: Gastroenterology;  Laterality: N/A;   EYE SURGERY     HEMORRHOID SURGERY  2011   INCONTINENCE SURGERY     INNER EAR SURGERY     X 2   RECTAL PROLAPSE REPAIR     RETINAL DETACHMENT SURGERY Bilateral 2000   TOTAL KNEE ARTHROPLASTY Left 04/29/2019   Procedure: LEFT TOTAL KNEE ARTHROPLASTY;  Surgeon: Vernetta Lonni GRADE, MD;  Location: [facility];  Service: Orthopedics;  Laterality: Left;   TUBAL LIGATION     VEIN SURGERY Left 04/2010   Patient Active Problem List   Diagnosis Date Noted   Sore throat 04/30/2023   Acute non-recurrent maxillary sinusitis 04/30/2023   Cystitis 04/30/2023   History of DVT (deep vein thrombosis) 04/10/2023   Plaque psoriasis 04/10/2023   Right corneal abrasion 04/10/2023   Dysuria 10/18/2022   Multiple food allergies 10/12/2022  Chronic GERD 08/16/2022   Mild sleep apnea 07/26/2022   Prediabetes 04/13/2022   Palpitations 04/11/2022   Psoriasis of scalp 03/28/2022   Anemia due to vitamin B12 deficiency 03/28/2022   Reflux laryngitis 03/18/2022   Restless leg 03/18/2022   History of endometriosis 03/07/2022   Irritable bowel syndrome with both constipation and diarrhea 03/07/2022   Bronchiolitis 03/07/2022   Chronic fatigue 03/07/2022   Loud snoring 03/07/2022   Reactive airway disease 10/07/2020   Gastroesophageal reflux disease 04/13/2020   Chronic prescription benzodiazepine use 04/13/2020   Abnormal cervical Papanicolaou smear 03/31/2020   Pituitary microadenoma (HCC) 03/31/2020   Endometriosis 03/31/2020   H/O: hysterectomy 03/31/2020   Insomnia 02/03/2020   Pain in female genitalia  on intercourse 01/29/2020   Impingement syndrome of right shoulder 10/28/2018   Chronic pain of left knee 03/27/2018   Chronic right shoulder pain 03/27/2018   Cervicalgia 03/27/2018   Unilateral primary osteoarthritis, left knee 08/06/2017   Tremor 07/10/2017   Trochanteric bursitis, right hip 09/27/2016   GAD (generalized anxiety disorder) 05/31/2014   Rectocele 08/21/2011   Acute cough 10/25/2010   Hypertension 10/13/2010    PCP: Corwin Antu, FNP  REFERRING PROVIDER: Lorilee Sven SQUIBB, MD  REFERRING DIAG: M25.561,M25.562,G89.29 (ICD-10-CM) - Chronic pain of both knees   THERAPY DIAG:  Muscle weakness (generalized)  Chronic pain of left knee  Other abnormalities of gait and mobility  Other low back pain  Rationale for Evaluation and Treatment: Rehabilitation  ONSET DATE: Chronic, 2019  SUBJECTIVE:   SUBJECTIVE STATEMENT: Pt presents to PT with reports of increased knee and back pain. Has been compliant with HEP with no adverse effect.   EVAL: Pt comes into PT using a single point cane with c/c of bilateral knee pain that she rates a 6/10 at best. Pt had a L TKA in 2020 which she did PT for but says she struggles with bending the L knee. Pt largest concern is inflammation and tightness. Describes knee pain as sharp, dull, achy, etc. Dependent on the time. Additionally has concerns with her left quad which she says cannot take any loading or pressure. Patient reports she bruises very easily and experiences a lot of pain. Challenges include transferring from the floor, stairs, squatting, walking, etc. Has worn orthotic inserts and wears ASICS or Brooks. Patient took pain medicine before treatment session which she takes 3 times a day on a regular basis. Patient wants to get her back looked at as well.   PERTINENT HISTORY: TKA 2020 Depression PAIN:  Are you having pain?  Yes: NPRS scale: 6/10  Worst: 10/10 Pain location: Both knees Pain description: sharp, dull,  achy, etc. Aggravating factors: Everything Relieving factors: Pain medication   PRECAUTIONS: None  RED FLAGS: None   WEIGHT BEARING RESTRICTIONS: No  FALLS:  Has patient fallen in last 6 months? No but reports losing balance a lot.   LIVING ENVIRONMENT: Lives with: lives with their partner Lives in: House/apartment Stairs: Yes, External, 3 stairs.  Has following equipment at home: Single point cane and Wheelchair (power)  OCCUPATION: None  PLOF: Independent  PATIENT GOALS: Wants to get back to a regular life. Cannot clean, cook because standing bothers her, play with her grand kids, etc.   OBJECTIVE:  Note: Objective measures were completed at Evaluation unless otherwise noted.  PATIENT SURVEYS:  FOTO 25% ODI: 34/50 - 68% disability  COGNITION: Overall cognitive status: Within functional limits for tasks assessed     SENSATION: WFL Loss of sensation of  lateral knee after TKA  EDEMA:  Notable swelling in left knee  MUSCLE LENGTH: Hamstrings: Both tight   POSTURE: rounded shoulders and forward head  PALPATION: Tight hamstrings, edema in knee cap, tight gastroc.  LOWER EXTREMITY ROM:  Active ROM Right eval Left eval  Hip flexion    Hip extension    Hip abduction    Hip adduction    Hip internal rotation    Hip external rotation    Knee flexion 135 110  Knee extension -1 -1  Ankle dorsiflexion    Ankle plantarflexion    Ankle inversion    Ankle eversion     (Blank rows = not tested)  LOWER EXTREMITY MMT:  MMT Right eval Left eval  Hip flexion 4- 4-  Hip extension    Hip abduction 4- 3+  Hip adduction    Hip internal rotation    Hip external rotation    Knee flexion 4 4  Knee extension 4 4  Ankle dorsiflexion    Ankle plantarflexion    Ankle inversion    Ankle eversion     (Blank rows = not tested)  FUNCTIONAL TESTS:  30 seconds chair stand test: 9  GAIT: Distance walked: 20 ft Assistive device utilized: None Level of  assistance: Complete Independence Comments: Limited DF, limited extension, limited hip flexion, lack of pressure through L LE.                                                                                                                          TREATMENT: [facility] Adult PT Treatment:                                                 Therapeutic Exercise:  NuStep L5 x 3 min LE only while taking subjective Supine QS x 10 - 5 hold Supine pilates SLR 2x10 Pilates ring bridge 3x10 with PPT Supine PPT with pilates ring 2x10 - 3 hold S/L hip abd 2x10 each Supine PPT with march and blue badn pulldown 2x20  TKE with red power cord 2x15 L Standing hip abd 2x10 RTB each  STS with pilates ring squeeze x 10 - no UE  PATIENT EDUCATION:  Education details: continue HEP Person educated: Patient Education method: Explanation, Demonstration, Tactile cues, Verbal cues, and Handouts Education comprehension: verbalized understanding, returned demonstration, and needs further education  HOME EXERCISE PROGRAM: Access Code: [email or web address]   ASSESSMENT:  CLINICAL IMPRESSION: Pt was able to complete all prescribed exercises with no adverse effect. Therapy today focused on improving core and LE strength in order to decrease pain and improve functional ability with home ADLs and community activities. Continue to progress difficulty of neutral spine core strengthening. She is continuing to progress well with therapy, will continue per POC as tolerated.   EVAL: Patient is a 58 y.o. female who  was seen today for physical therapy evaluation and treatment for bilateral knee pain, more in the left knee than right. Patient exhibits weakness in the LE and tightness, particularly in the hamstring and calf. FOTO score indicates sharp decrease in subjective functional ability below PLOF. Patient would benefit from skilled PT to decrease pain and increase functional ability.   OBJECTIVE IMPAIRMENTS: Abnormal gait, decreased  activity tolerance, decreased balance, decreased ROM, and decreased strength.   ACTIVITY LIMITATIONS: carrying, lifting, bending, sitting, standing, squatting, stairs, and transfers  PARTICIPATION LIMITATIONS: meal prep, cleaning, laundry, driving, and community activity  PERSONAL FACTORS: Past/current experiences, Time since onset of injury/illness/exacerbation, and 3+ comorbidities: see PMH above are also affecting patient's functional outcome.   REHAB POTENTIAL: Good  CLINICAL DECISION MAKING: Stable/uncomplicated  EVALUATION COMPLEXITY: Moderate   GOALS: Goals reviewed with patient? Yes  SHORT TERM GOALS: Target date: 08/01/2023  Patient will be I with initial HEP in order to progress with therapy. Baseline: Goal status: MET  2.  Patient will score >/= 5/10 at worse on the NPS by 3rd visit in order to show an improvement in pain allowing for an increase in functional ability.   Baseline: 6/10 best Goal status: INITIAL  LONG TERM GOALS: Target date: 08/29/2023   Patient will be independent with final HEP in order to continue treatment at home for pain tolerance.   Baseline:  Goal status: INITIAL  2.  Patient will score >/= 4/10 at worse to show an improvement in pain allowing for an increase in functional ability.  Baseline: 6/10 best Goal status: INITIAL  3.  Patient will score at least 45% on FOTO to signify clinically meaningful improvement in functional abilities.  Baseline: 25% Goal status: INITIAL  4.  Patient will get all MMT scores to a 4/5 on the L LE. Baseline: see above Goal status: INITIAL  5. Patient will increase 30 Second Sit to Stand rep count to no less than 11 reps for improved balance, strength, and functional mobility Baseline: 9 reps  Goal status: INITIAL   6. Pt will be decrease ODI disability score to no greater than 54% as proxy for functional improvement Baseline: 68% disability    Goal status: INITIAL   PLAN:  PT FREQUENCY:  1x/week  PT DURATION: 8 weeks  PLANNED INTERVENTIONS: 97110-Therapeutic exercises, 97530- Therapeutic activity, 97112- Neuromuscular re-education, 97535- Self Care, [id number]- Manual therapy, [phone]- Gait training, [phone]- Vasopneumatic device, Patient/Family education, Balance training, Stair training, Joint mobilization, Joint manipulation, Spinal manipulation, Spinal mobilization, Cryotherapy, and Moist heat  PLAN FOR NEXT SESSION: Continue working on strengthening of the quad, look at lower back next session.    Alm JAYSON Kingdom PT  07/25/23 3:14 PM

## 2023-08-01 ENCOUNTER — Ambulatory Visit: Payer: Medicare HMO

## 2023-08-01 DIAGNOSIS — G8929 Other chronic pain: Secondary | ICD-10-CM

## 2023-08-01 DIAGNOSIS — M25562 Pain in left knee: Secondary | ICD-10-CM | POA: Diagnosis not present

## 2023-08-01 DIAGNOSIS — R2689 Other abnormalities of gait and mobility: Secondary | ICD-10-CM | POA: Diagnosis not present

## 2023-08-01 DIAGNOSIS — M6281 Muscle weakness (generalized): Secondary | ICD-10-CM

## 2023-08-01 DIAGNOSIS — M5459 Other low back pain: Secondary | ICD-10-CM | POA: Diagnosis not present

## 2023-08-01 NOTE — Therapy (Signed)
OUTPATIENT PHYSICAL THERAPY TREATMENT   Patient Name: Sharon [name] MRN: [id number] DOB:[birth year], 58 y.o., female Today's Date: 08/01/2023  END OF SESSION:  PT End of Session - 08/01/23 1400     Visit Number 5    Number of Visits 9    Date for PT Re-Evaluation 08/29/23    Authorization Type [employer] Medicare    PT Start Time 1400    PT Stop Time 1445    PT Time Calculation (min) 45 min    Activity Tolerance Patient tolerated treatment well    Behavior During Therapy WFL for tasks assessed/performed                Past Medical History:  Diagnosis Date   Allergy    Anal fissure    Anemia    borderline   Anxiety    Arthritis    Blood clot in vein    left leg   Detached retina    BOTH SCLERA BUCKLE BOTH EYES   DVT (deep venous thrombosis) (HCC)    left leg   Endometriosis    Family history of adverse reaction to anesthesia    DAUGHTER POST OP PONV   GERD (gastroesophageal reflux disease)    Glaucoma    RIGHT WORST THAN LEFT   Heart murmur    "caused by anxiety"    History of hemorrhoids    History of kidney stones    Hypertension    IBS (irritable bowel syndrome)    Perforated eardrum, right    SMALL HOLE   PONV (postoperative nausea and vomiting)    Pulmonary embolism (HCC) 6 YRS AGO   Status post arthroscopy of left knee 08/06/2017   Past Surgical History:  Procedure Laterality Date   ABDOMINAL HYSTERECTOMY     still with both ovaries   BREAST BIOPSY Left 12/19/2022   [place] LT BREAST BX W LOC DEV 1ST LESION IMG BX SPEC US GUIDE 12/19/2022 [facility]   CATARACT EXTRACTION Bilateral 2015   COLONOSCOPY     CYSTOSCOPY W/ URETERAL STENT PLACEMENT Right 05/23/2017   Procedure: CYSTOSCOPY WITH RETROGRADE PYELOGRAM/URETERAL RIGHT STENT PLACEMENT;  Surgeon: Alfredo Martinez, MD;  Location: [facility];  Service: Urology;  Laterality: Right;   CYSTOSCOPY W/ URETERAL STENT PLACEMENT Right 06/04/2017   Procedure: CYSTOSCOPY WITHRIGHT RETROGRADE  PYELOGRAMRIGHT /URETEREOSCOPY  STENT PLACEMENT;  Surgeon: Crista Elliot, MD;  Location: [facility];  Service: Urology;  Laterality: Right;   ENDOMETRIAL ABLATION     ESOPHAGOGASTRODUODENOSCOPY (EGD) WITH PROPOFOL N/A 08/16/2022   Procedure: ESOPHAGOGASTRODUODENOSCOPY (EGD) WITH PROPOFOL;  Surgeon: Toney Reil, MD;  Location: [facility];  Service: Gastroenterology;  Laterality: N/A;   EYE SURGERY     HEMORRHOID SURGERY  2011   INCONTINENCE SURGERY     INNER EAR SURGERY     X 2   RECTAL PROLAPSE REPAIR     RETINAL DETACHMENT SURGERY Bilateral 2000   TOTAL KNEE ARTHROPLASTY Left 04/29/2019   Procedure: LEFT TOTAL KNEE ARTHROPLASTY;  Surgeon: Kathryne Hitch, MD;  Location: [facility];  Service: Orthopedics;  Laterality: Left;   TUBAL LIGATION     VEIN SURGERY Left 04/2010   Patient Active Problem List   Diagnosis Date Noted   Sore throat 04/30/2023   Acute non-recurrent maxillary sinusitis 04/30/2023   Cystitis 04/30/2023   History of DVT (deep vein thrombosis) 04/10/2023   Plaque psoriasis 04/10/2023   Right corneal abrasion 04/10/2023   Dysuria 10/18/2022   Multiple food allergies 10/12/2022  Chronic GERD 08/16/2022   Mild sleep apnea 07/26/2022   Prediabetes 04/13/2022   Palpitations 04/11/2022   Psoriasis of scalp 03/28/2022   Anemia due to vitamin B12 deficiency 03/28/2022   Reflux laryngitis 03/18/2022   Restless leg 03/18/2022   History of endometriosis 03/07/2022   Irritable bowel syndrome with both constipation and diarrhea 03/07/2022   Bronchiolitis 03/07/2022   Chronic fatigue 03/07/2022   Loud snoring 03/07/2022   Reactive airway disease 10/07/2020   Gastroesophageal reflux disease 04/13/2020   Chronic prescription benzodiazepine use 04/13/2020   Abnormal cervical Papanicolaou smear 03/31/2020   Pituitary microadenoma (HCC) 03/31/2020   Endometriosis 03/31/2020   H/O: hysterectomy 03/31/2020   Insomnia 02/03/2020   Pain in  female genitalia on intercourse 01/29/2020   Impingement syndrome of right shoulder 10/28/2018   Chronic pain of left knee 03/27/2018   Chronic right shoulder pain 03/27/2018   Cervicalgia 03/27/2018   Unilateral primary osteoarthritis, left knee 08/06/2017   Tremor 07/10/2017   Trochanteric bursitis, right hip 09/27/2016   GAD (generalized anxiety disorder) 05/31/2014   Rectocele 08/21/2011   Acute cough 10/25/2010   Hypertension 10/13/2010    PCP: Mort Sawyers, FNP  REFERRING PROVIDER: Horton Chin, MD  REFERRING DIAG: M25.561,M25.562,G89.29 (ICD-10-CM) - Chronic pain of both knees   THERAPY DIAG:  Muscle weakness (generalized)  Chronic pain of left knee  Other abnormalities of gait and mobility  Rationale for Evaluation and Treatment: Rehabilitation  ONSET DATE: Chronic, 2019  SUBJECTIVE:   SUBJECTIVE STATEMENT: Pt presents to PT with reports of increase back and L knee pain, attributes it mainly to the weather. Has been compliant with HEP with no adverse effect.   EVAL: Pt comes into PT using a single point cane with c/c of bilateral knee pain that she rates a 6/10 at best. Pt had a L TKA in 2020 which she did PT for but says she struggles with bending the L knee. Pt largest concern is inflammation and tightness. Describes knee pain as sharp, dull, achy, etc. Dependent on the time. Additionally has concerns with her left quad which she says cannot take any loading or pressure. Patient reports she bruises very easily and experiences a lot of pain. Challenges include transferring from the floor, stairs, squatting, walking, etc. Has worn orthotic inserts and wears ASICS or Brooks. Patient took pain medicine before treatment session which she takes 3 times a day on a regular basis. Patient wants to get her back looked at as well.   PERTINENT HISTORY: TKA 2020 Depression PAIN:  Are you having pain?  Yes: NPRS scale: 6/10  Worst: 10/10 Pain location: Both  knees Pain description: sharp, dull, achy, etc. Aggravating factors: Everything Relieving factors: Pain medication   PRECAUTIONS: None  RED FLAGS: None   WEIGHT BEARING RESTRICTIONS: No  FALLS:  Has patient fallen in last 6 months? No but reports losing balance a lot.   LIVING ENVIRONMENT: Lives with: lives with their partner Lives in: House/apartment Stairs: Yes, External, 3 stairs.  Has following equipment at home: Single point cane and Wheelchair (power)  OCCUPATION: None  PLOF: Independent  PATIENT GOALS: Wants to get back to a regular life. Cannot clean, cook because standing bothers her, play with her grand kids, etc.   OBJECTIVE:  Note: Objective measures were completed at Evaluation unless otherwise noted.  PATIENT SURVEYS:  FOTO 25% ODI: 34/50 - 68% disability  COGNITION: Overall cognitive status: Within functional limits for tasks assessed     SENSATION: WFL Loss of  sensation of lateral knee after TKA  EDEMA:  Notable swelling in left knee  MUSCLE LENGTH: Hamstrings: Both tight   POSTURE: rounded shoulders and forward head  PALPATION: Tight hamstrings, edema in knee cap, tight gastroc.  LOWER EXTREMITY ROM:  Active ROM Right eval Left eval Left 08/01/23  Hip flexion     Hip extension     Hip abduction     Hip adduction     Hip internal rotation     Hip external rotation     Knee flexion 135 110 130  Knee extension -1 -1   Ankle dorsiflexion     Ankle plantarflexion     Ankle inversion     Ankle eversion      (Blank rows = not tested)  LOWER EXTREMITY MMT:  MMT Right eval Left eval  Hip flexion 4- 4-  Hip extension    Hip abduction 4- 3+  Hip adduction    Hip internal rotation    Hip external rotation    Knee flexion 4 4  Knee extension 4 4  Ankle dorsiflexion    Ankle plantarflexion    Ankle inversion    Ankle eversion     (Blank rows = not tested)  FUNCTIONAL TESTS:  30 seconds chair stand test: 9  GAIT: Distance  walked: 20 ft Assistive device utilized: None Level of assistance: Complete Independence Comments: Limited DF, limited extension, limited hip flexion, lack of pressure through L LE.                                                                                                                          TREATMENT: [facility] Adult PT Treatment:                                                 Therapeutic Exercise:  NuStep L5 x 4 min LE only while taking subjective Supine PPT x 10 - 5" hold Supine PPT with pilates ring 2x10 - 3" hold Supine QS x 10 - 5" hold Supine pilates SLR 2x10 each  Pilates ring bridge 3x10 Supine heel slide with strap x 10 - 5" hold L LTR x 10  LAQ 2x10 2.5# Tandem stance x 30" each  PATIENT EDUCATION:  Education details: continue HEP Person educated: Patient Education method: Explanation, Demonstration, Tactile cues, Verbal cues, and Handouts Education comprehension: verbalized understanding, returned demonstration, and needs further education  HOME EXERCISE PROGRAM: Access Code: [email or web address]   ASSESSMENT:  CLINICAL IMPRESSION: Pt was able to complete all prescribed exercises with no adverse effect. Exercises focused on improving core and proximal hip strength as well as L quad stability and knee ROM. Left knee ROM showed improvement in flexion today. No changes to HEP. Pt continues to progress well with therapy, will continue per POC.   EVAL: Patient is a 58 y.o. female  who was seen today for physical therapy evaluation and treatment for bilateral knee pain, more in the left knee than right. Patient exhibits weakness in the LE and tightness, particularly in the hamstring and calf. FOTO score indicates sharp decrease in subjective functional ability below PLOF. Patient would benefit from skilled PT to decrease pain and increase functional ability.   OBJECTIVE IMPAIRMENTS: Abnormal gait, decreased activity tolerance, decreased balance, decreased ROM, and decreased  strength.   ACTIVITY LIMITATIONS: carrying, lifting, bending, sitting, standing, squatting, stairs, and transfers  PARTICIPATION LIMITATIONS: meal prep, cleaning, laundry, driving, and community activity  PERSONAL FACTORS: Past/current experiences, Time since onset of injury/illness/exacerbation, and 3+ comorbidities: see PMH above are also affecting patient's functional outcome.   REHAB POTENTIAL: Good  CLINICAL DECISION MAKING: Stable/uncomplicated  EVALUATION COMPLEXITY: Moderate   GOALS: Goals reviewed with patient? Yes  SHORT TERM GOALS: Target date: 08/01/2023  Patient will be I with initial HEP in order to progress with therapy. Baseline: Goal status: MET  2.  Patient will score >/= 5/10 at worse on the NPS by 3rd visit in order to show an improvement in pain allowing for an increase in functional ability.   Baseline: 6/10 best Goal status: INITIAL  LONG TERM GOALS: Target date: 08/29/2023   Patient will be independent with final HEP in order to continue treatment at home for pain tolerance.   Baseline:  Goal status: INITIAL  2.  Patient will score >/= 4/10 at worse to show an improvement in pain allowing for an increase in functional ability.  Baseline: 6/10 best Goal status: INITIAL  3.  Patient will score at least 45% on FOTO to signify clinically meaningful improvement in functional abilities.  Baseline: 25% Goal status: INITIAL  4.  Patient will get all MMT scores to a 4/5 on the L LE. Baseline: see above Goal status: INITIAL  5. Patient will increase 30 Second Sit to Stand rep count to no less than 11 reps for improved balance, strength, and functional mobility Baseline: 9 reps  Goal status: INITIAL   6. Pt will be decrease ODI disability score to no greater than 54% as proxy for functional improvement Baseline: 68% disability    Goal status: INITIAL   PLAN:  PT FREQUENCY: 1x/week  PT DURATION: 8 weeks  PLANNED INTERVENTIONS: 97110-Therapeutic  exercises, 97530- Therapeutic activity, 97112- Neuromuscular re-education, 97535- Self Care, 16109- Manual therapy, [phone]- Gait training, [phone]- Vasopneumatic device, Patient/Family education, Balance training, Stair training, Joint mobilization, Joint manipulation, Spinal manipulation, Spinal mobilization, Cryotherapy, and Moist heat  PLAN FOR NEXT SESSION: Continue working on strengthening of the quad, look at lower back next session.    Eloy End PT  08/01/23 3:27 PM

## 2023-08-04 ENCOUNTER — Other Ambulatory Visit: Payer: Self-pay | Admitting: Physical Medicine and Rehabilitation

## 2023-08-07 ENCOUNTER — Ambulatory Visit: Payer: Medicare HMO

## 2023-08-07 DIAGNOSIS — M25562 Pain in left knee: Secondary | ICD-10-CM | POA: Diagnosis not present

## 2023-08-07 DIAGNOSIS — M5459 Other low back pain: Secondary | ICD-10-CM | POA: Diagnosis not present

## 2023-08-07 DIAGNOSIS — R2689 Other abnormalities of gait and mobility: Secondary | ICD-10-CM

## 2023-08-07 DIAGNOSIS — G8929 Other chronic pain: Secondary | ICD-10-CM | POA: Diagnosis not present

## 2023-08-07 DIAGNOSIS — M6281 Muscle weakness (generalized): Secondary | ICD-10-CM | POA: Diagnosis not present

## 2023-08-07 NOTE — Therapy (Signed)
OUTPATIENT PHYSICAL THERAPY TREATMENT   Patient Name: Sharon [name] MRN: [id number] DOB:[birth year], 58 y.o., female Today's Date: 08/08/2023  END OF SESSION:  PT End of Session - 08/07/23 1443     Visit Number 6    Number of Visits 9    Date for PT Re-Evaluation 08/29/23    Authorization Type [employer]    PT Start Time 1445    PT Stop Time 1525    PT Time Calculation (min) 40 min    Activity Tolerance Patient tolerated treatment well    Behavior During Therapy WFL for tasks assessed/performed                 Past Medical History:  Diagnosis Date   Allergy    Anal fissure    Anemia    borderline   Anxiety    Arthritis    Blood clot in vein    left leg   Detached retina    BOTH SCLERA BUCKLE BOTH EYES   DVT (deep venous thrombosis) (HCC)    left leg   Endometriosis    Family history of adverse reaction to anesthesia    DAUGHTER POST OP PONV   GERD (gastroesophageal reflux disease)    Glaucoma    RIGHT WORST THAN LEFT   Heart murmur    "caused by anxiety"    History of hemorrhoids    History of kidney stones    Hypertension    IBS (irritable bowel syndrome)    Perforated eardrum, right    SMALL HOLE   PONV (postoperative nausea and vomiting)    Pulmonary embolism (HCC) 6 YRS AGO   Status post arthroscopy of left knee 08/06/2017   Past Surgical History:  Procedure Laterality Date   ABDOMINAL HYSTERECTOMY     still with both ovaries   BREAST BIOPSY Left 12/19/2022   [place] LT BREAST BX W LOC DEV 1ST LESION IMG BX SPEC US GUIDE 12/19/2022 [facility]   CATARACT EXTRACTION Bilateral 2015   COLONOSCOPY     CYSTOSCOPY W/ URETERAL STENT PLACEMENT Right 05/23/2017   Procedure: CYSTOSCOPY WITH RETROGRADE PYELOGRAM/URETERAL RIGHT STENT PLACEMENT;  Surgeon: Alfredo Martinez, MD;  Location: [facility];  Service: Urology;  Laterality: Right;   CYSTOSCOPY W/ URETERAL STENT PLACEMENT Right 06/04/2017   Procedure: CYSTOSCOPY WITHRIGHT RETROGRADE  PYELOGRAMRIGHT /URETEREOSCOPY  STENT PLACEMENT;  Surgeon: Crista Elliot, MD;  Location: [facility];  Service: Urology;  Laterality: Right;   ENDOMETRIAL ABLATION     ESOPHAGOGASTRODUODENOSCOPY (EGD) WITH PROPOFOL N/A 08/16/2022   Procedure: ESOPHAGOGASTRODUODENOSCOPY (EGD) WITH PROPOFOL;  Surgeon: Toney Reil, MD;  Location: [facility];  Service: Gastroenterology;  Laterality: N/A;   EYE SURGERY     HEMORRHOID SURGERY  2011   INCONTINENCE SURGERY     INNER EAR SURGERY     X 2   RECTAL PROLAPSE REPAIR     RETINAL DETACHMENT SURGERY Bilateral 2000   TOTAL KNEE ARTHROPLASTY Left 04/29/2019   Procedure: LEFT TOTAL KNEE ARTHROPLASTY;  Surgeon: Kathryne Hitch, MD;  Location: [facility];  Service: Orthopedics;  Laterality: Left;   TUBAL LIGATION     VEIN SURGERY Left 04/2010   Patient Active Problem List   Diagnosis Date Noted   Sore throat 04/30/2023   Acute non-recurrent maxillary sinusitis 04/30/2023   Cystitis 04/30/2023   History of DVT (deep vein thrombosis) 04/10/2023   Plaque psoriasis 04/10/2023   Right corneal abrasion 04/10/2023   Dysuria 10/18/2022   Multiple food allergies  10/12/2022   Chronic GERD 08/16/2022   Mild sleep apnea 07/26/2022   Prediabetes 04/13/2022   Palpitations 04/11/2022   Psoriasis of scalp 03/28/2022   Anemia due to vitamin B12 deficiency 03/28/2022   Reflux laryngitis 03/18/2022   Restless leg 03/18/2022   History of endometriosis 03/07/2022   Irritable bowel syndrome with both constipation and diarrhea 03/07/2022   Bronchiolitis 03/07/2022   Chronic fatigue 03/07/2022   Loud snoring 03/07/2022   Reactive airway disease 10/07/2020   Gastroesophageal reflux disease 04/13/2020   Chronic prescription benzodiazepine use 04/13/2020   Abnormal cervical Papanicolaou smear 03/31/2020   Pituitary microadenoma (HCC) 03/31/2020   Endometriosis 03/31/2020   H/O: hysterectomy 03/31/2020   Insomnia 02/03/2020   Pain in  female genitalia on intercourse 01/29/2020   Impingement syndrome of right shoulder 10/28/2018   Chronic pain of left knee 03/27/2018   Chronic right shoulder pain 03/27/2018   Cervicalgia 03/27/2018   Unilateral primary osteoarthritis, left knee 08/06/2017   Tremor 07/10/2017   Trochanteric bursitis, right hip 09/27/2016   GAD (generalized anxiety disorder) 05/31/2014   Rectocele 08/21/2011   Acute cough 10/25/2010   Hypertension 10/13/2010    PCP: Mort Sawyers, FNP  REFERRING PROVIDER: Horton Chin, MD  REFERRING DIAG: M25.561,M25.562,G89.29 (ICD-10-CM) - Chronic pain of both knees   THERAPY DIAG:  Muscle weakness (generalized)  Chronic pain of left knee  Other abnormalities of gait and mobility  Other low back pain  Rationale for Evaluation and Treatment: Rehabilitation  ONSET DATE: Chronic, 2019  SUBJECTIVE:   SUBJECTIVE STATEMENT: Pt presents to PT with reports of continued back and knee pain. Has been compliant with HEP.   EVAL: Pt comes into PT using a single point cane with c/c of bilateral knee pain that she rates a 6/10 at best. Pt had a L TKA in 2020 which she did PT for but says she struggles with bending the L knee. Pt largest concern is inflammation and tightness. Describes knee pain as sharp, dull, achy, etc. Dependent on the time. Additionally has concerns with her left quad which she says cannot take any loading or pressure. Patient reports she bruises very easily and experiences a lot of pain. Challenges include transferring from the floor, stairs, squatting, walking, etc. Has worn orthotic inserts and wears ASICS or Brooks. Patient took pain medicine before treatment session which she takes 3 times a day on a regular basis. Patient wants to get her back looked at as well.   PERTINENT HISTORY: TKA 2020 Depression PAIN:  Are you having pain?  Yes: NPRS scale: 9/10  Worst: 10/10 Pain location: Both knees Pain description: sharp, dull, achy,  etc. Aggravating factors: Everything Relieving factors: Pain medication   PRECAUTIONS: None  RED FLAGS: None   WEIGHT BEARING RESTRICTIONS: No  FALLS:  Has patient fallen in last 6 months? No but reports losing balance a lot.   LIVING ENVIRONMENT: Lives with: lives with their partner Lives in: House/apartment Stairs: Yes, External, 3 stairs.  Has following equipment at home: Single point cane and Wheelchair (power)  OCCUPATION: None  PLOF: Independent  PATIENT GOALS: Wants to get back to a regular life. Cannot clean, cook because standing bothers her, play with her grand kids, etc.   OBJECTIVE:  Note: Objective measures were completed at Evaluation unless otherwise noted.  PATIENT SURVEYS:  FOTO 25% ODI: 34/50 - 68% disability  COGNITION: Overall cognitive status: Within functional limits for tasks assessed     SENSATION: WFL Loss of sensation of lateral  knee after TKA  EDEMA:  Notable swelling in left knee  MUSCLE LENGTH: Hamstrings: Both tight   POSTURE: rounded shoulders and forward head  PALPATION: Tight hamstrings, edema in knee cap, tight gastroc.  LOWER EXTREMITY ROM:  Active ROM Right eval Left eval Left 08/01/23  Hip flexion     Hip extension     Hip abduction     Hip adduction     Hip internal rotation     Hip external rotation     Knee flexion 135 110 130  Knee extension -1 -1   Ankle dorsiflexion     Ankle plantarflexion     Ankle inversion     Ankle eversion      (Blank rows = not tested)  LOWER EXTREMITY MMT:  MMT Right eval Left eval  Hip flexion 4- 4-  Hip extension    Hip abduction 4- 3+  Hip adduction    Hip internal rotation    Hip external rotation    Knee flexion 4 4  Knee extension 4 4  Ankle dorsiflexion    Ankle plantarflexion    Ankle inversion    Ankle eversion     (Blank rows = not tested)  FUNCTIONAL TESTS:  30 seconds chair stand test: 9  GAIT: Distance walked: 20 ft Assistive device utilized:  None Level of assistance: Complete Independence Comments: Limited DF, limited extension, limited hip flexion, lack of pressure through L LE.                                                                                                                          TREATMENT: [facility] Adult PT Treatment:                                                 Therapeutic Exercise:  NuStep L5 x 4 min LE only while taking subjective Supine PPT x 10 - 5" hold Supine PPT with pilates ring 2x10 - 3" hold Supine pilates SLR 2x10 each  Pilates ring bridge 2x10 Supine QS x 10 - 5" hold Step ups 2x10 fwd 6in 1 UE support TKE with red power cord 2x15 L  LAQ 2x10 3#  PATIENT EDUCATION:  Education details: continue HEP Person educated: Patient Education method: Explanation, Demonstration, Tactile cues, Verbal cues, and Handouts Education comprehension: verbalized understanding, returned demonstration, and needs further education  HOME EXERCISE PROGRAM: Access Code: [email or web address]   ASSESSMENT:  CLINICAL IMPRESSION: Pt was able to complete all prescribed exercises with no adverse effect. Exercises focused on improving core and proximal hip strength as well as L quad stability and knee ROM. No changes to HEP. Pt continues to progress well with therapy, will continue per POC.   EVAL: Patient is a 58 y.o. female who was seen today for physical therapy evaluation and treatment for bilateral knee pain, more  in the left knee than right. Patient exhibits weakness in the LE and tightness, particularly in the hamstring and calf. FOTO score indicates sharp decrease in subjective functional ability below PLOF. Patient would benefit from skilled PT to decrease pain and increase functional ability.   OBJECTIVE IMPAIRMENTS: Abnormal gait, decreased activity tolerance, decreased balance, decreased ROM, and decreased strength.   ACTIVITY LIMITATIONS: carrying, lifting, bending, sitting, standing, squatting, stairs, and  transfers  PARTICIPATION LIMITATIONS: meal prep, cleaning, laundry, driving, and community activity  PERSONAL FACTORS: Past/current experiences, Time since onset of injury/illness/exacerbation, and 3+ comorbidities: see PMH above are also affecting patient's functional outcome.   REHAB POTENTIAL: Good  CLINICAL DECISION MAKING: Stable/uncomplicated  EVALUATION COMPLEXITY: Moderate   GOALS: Goals reviewed with patient? Yes  SHORT TERM GOALS: Target date: 08/01/2023  Patient will be I with initial HEP in order to progress with therapy. Baseline: Goal status: MET  2.  Patient will score >/= 5/10 at worse on the NPS by 3rd visit in order to show an improvement in pain allowing for an increase in functional ability.   Baseline: 6/10 best Goal status: INITIAL  LONG TERM GOALS: Target date: 08/29/2023   Patient will be independent with final HEP in order to continue treatment at home for pain tolerance.   Baseline:  Goal status: INITIAL  2.  Patient will score >/= 4/10 at worse to show an improvement in pain allowing for an increase in functional ability.  Baseline: 6/10 best Goal status: INITIAL  3.  Patient will score at least 45% on FOTO to signify clinically meaningful improvement in functional abilities.  Baseline: 25% Goal status: INITIAL  4.  Patient will get all MMT scores to a 4/5 on the L LE. Baseline: see above Goal status: INITIAL  5. Patient will increase 30 Second Sit to Stand rep count to no less than 11 reps for improved balance, strength, and functional mobility Baseline: 9 reps  Goal status: INITIAL   6. Pt will be decrease ODI disability score to no greater than 54% as proxy for functional improvement Baseline: 68% disability    Goal status: INITIAL   PLAN:  PT FREQUENCY: 1x/week  PT DURATION: 8 weeks  PLANNED INTERVENTIONS: 97110-Therapeutic exercises, 97530- Therapeutic activity, 97112- Neuromuscular re-education, 97535- Self Care, [id number]-  Manual therapy, [phone]- Gait training, [phone]- Vasopneumatic device, Patient/Family education, Balance training, Stair training, Joint mobilization, Joint manipulation, Spinal manipulation, Spinal mobilization, Cryotherapy, and Moist heat  PLAN FOR NEXT SESSION: Continue working on strengthening of the quad, look at lower back next session.    Eloy End PT  08/08/23 8:02 AM

## 2023-08-08 ENCOUNTER — Ambulatory Visit: Payer: Medicare HMO

## 2023-08-15 ENCOUNTER — Ambulatory Visit: Payer: Medicare HMO

## 2023-08-15 DIAGNOSIS — M6281 Muscle weakness (generalized): Secondary | ICD-10-CM

## 2023-08-15 DIAGNOSIS — M5459 Other low back pain: Secondary | ICD-10-CM | POA: Diagnosis not present

## 2023-08-15 DIAGNOSIS — R2689 Other abnormalities of gait and mobility: Secondary | ICD-10-CM

## 2023-08-15 DIAGNOSIS — M25562 Pain in left knee: Secondary | ICD-10-CM | POA: Diagnosis not present

## 2023-08-15 DIAGNOSIS — G8929 Other chronic pain: Secondary | ICD-10-CM | POA: Diagnosis not present

## 2023-08-15 NOTE — Therapy (Signed)
 OUTPATIENT PHYSICAL THERAPY TREATMENT   Patient Name: Sharon [name] MRN: [id number] DOB:1966/05/12, 58 y.o., female Today's Date: 08/15/2023  END OF SESSION:  PT End of Session - 08/15/23 1353     Visit Number 7    Number of Visits 9    Date for PT Re-Evaluation 08/29/23    Authorization Type [employer]    PT Start Time 1400    PT Stop Time 1440    PT Time Calculation (min) 40 min    Activity Tolerance Patient tolerated treatment well    Behavior During Therapy WFL for tasks assessed/performed                  Past Medical History:  Diagnosis Date   Allergy    Anal fissure    Anemia    borderline   Anxiety    Arthritis    Blood clot in vein    left leg   Detached retina    BOTH SCLERA BUCKLE BOTH EYES   DVT (deep venous thrombosis) (HCC)    left leg   Endometriosis    Family history of adverse reaction to anesthesia    DAUGHTER POST OP PONV   GERD (gastroesophageal reflux disease)    Glaucoma    RIGHT WORST THAN LEFT   Heart murmur    "caused by anxiety"    History of hemorrhoids    History of kidney stones    Hypertension    IBS (irritable bowel syndrome)    Perforated eardrum, right    SMALL HOLE   PONV (postoperative nausea and vomiting)    Pulmonary embolism (HCC) 6 YRS AGO   Status post arthroscopy of left knee 08/06/2017   Past Surgical History:  Procedure Laterality Date   ABDOMINAL HYSTERECTOMY     still with both ovaries   BREAST BIOPSY Left 12/19/2022   [place] LT BREAST BX W LOC DEV 1ST LESION IMG BX SPEC US GUIDE 12/19/2022 [facility]   CATARACT EXTRACTION Bilateral 2015   COLONOSCOPY     CYSTOSCOPY W/ URETERAL STENT PLACEMENT Right 05/23/2017   Procedure: CYSTOSCOPY WITH RETROGRADE PYELOGRAM/URETERAL RIGHT STENT PLACEMENT;  Surgeon: Alfredo Martinez, MD;  Location: [facility];  Service: Urology;  Laterality: Right;   CYSTOSCOPY W/ URETERAL STENT PLACEMENT Right 06/04/2017   Procedure: CYSTOSCOPY WITHRIGHT RETROGRADE  PYELOGRAMRIGHT /URETEREOSCOPY  STENT PLACEMENT;  Surgeon: Crista Elliot, MD;  [facility];  Service: Urology;  Laterality: Right;   ENDOMETRIAL ABLATION     ESOPHAGOGASTRODUODENOSCOPY (EGD) WITH PROPOFOL N/A 08/16/2022   Procedure: ESOPHAGOGASTRODUODENOSCOPY (EGD) WITH PROPOFOL;  Surgeon: Toney Reil, MD;  Location: [facility];  Service: Gastroenterology;  Laterality: N/A;   EYE SURGERY     HEMORRHOID SURGERY  2011   INCONTINENCE SURGERY     INNER EAR SURGERY     X 2   RECTAL PROLAPSE REPAIR     RETINAL DETACHMENT SURGERY Bilateral 2000   TOTAL KNEE ARTHROPLASTY Left 04/29/2019   Procedure: LEFT TOTAL KNEE ARTHROPLASTY;  Surgeon: Kathryne Hitch, MD;  Location: [facility];  Service: Orthopedics;  Laterality: Left;   TUBAL LIGATION     VEIN SURGERY Left 04/2010   Patient Active Problem List   Diagnosis Date Noted   Sore throat 04/30/2023   Acute non-recurrent maxillary sinusitis 04/30/2023   Cystitis 04/30/2023   History of DVT (deep vein thrombosis) 04/10/2023   Plaque psoriasis 04/10/2023   Right corneal abrasion 04/10/2023   Dysuria 10/18/2022   Multiple food  allergies 10/12/2022   Chronic GERD 08/16/2022   Mild sleep apnea 07/26/2022   Prediabetes 04/13/2022   Palpitations 04/11/2022   Psoriasis of scalp 03/28/2022   Anemia due to vitamin B12 deficiency 03/28/2022   Reflux laryngitis 03/18/2022   Restless leg 03/18/2022   History of endometriosis 03/07/2022   Irritable bowel syndrome with both constipation and diarrhea 03/07/2022   Bronchiolitis 03/07/2022   Chronic fatigue 03/07/2022   Loud snoring 03/07/2022   Reactive airway disease 10/07/2020   Gastroesophageal reflux disease 04/13/2020   Chronic prescription benzodiazepine use 04/13/2020   Abnormal cervical Papanicolaou smear 03/31/2020   Pituitary microadenoma (HCC) 03/31/2020   Endometriosis 03/31/2020   H/O: hysterectomy 03/31/2020   Insomnia 02/03/2020   Pain in  female genitalia on intercourse 01/29/2020   Impingement syndrome of right shoulder 10/28/2018   Chronic pain of left knee 03/27/2018   Chronic right shoulder pain 03/27/2018   Cervicalgia 03/27/2018   Unilateral primary osteoarthritis, left knee 08/06/2017   Tremor 07/10/2017   Trochanteric bursitis, right hip 09/27/2016   GAD (generalized anxiety disorder) 05/31/2014   Rectocele 08/21/2011   Acute cough 10/25/2010   Hypertension 10/13/2010    PCP: Mort Sawyers, FNP  REFERRING PROVIDER: Horton Chin, MD  REFERRING DIAG: M25.561,M25.562,G89.29 (ICD-10-CM) - Chronic pain of both knees   THERAPY DIAG:  Muscle weakness (generalized)  Chronic pain of left knee  Other abnormalities of gait and mobility  Rationale for Evaluation and Treatment: Rehabilitation  ONSET DATE: Chronic, 2019  SUBJECTIVE:   SUBJECTIVE STATEMENT: Pt was able to complete all prescribed exercises with no adverse effect. Has been compliant with HEP.   EVAL: Pt comes into PT using a single point cane with c/c of bilateral knee pain that she rates a 6/10 at best. Pt had a L TKA in 2020 which she did PT for but says she struggles with bending the L knee. Pt largest concern is inflammation and tightness. Describes knee pain as sharp, dull, achy, etc. Dependent on the time. Additionally has concerns with her left quad which she says cannot take any loading or pressure. Patient reports she bruises very easily and experiences a lot of pain. Challenges include transferring from the floor, stairs, squatting, walking, etc. Has worn orthotic inserts and wears ASICS or Brooks. Patient took pain medicine before treatment session which she takes 3 times a day on a regular basis. Patient wants to get her back looked at as well.   PERTINENT HISTORY: TKA 2020 Depression PAIN:  Are you having pain?  Yes: NPRS scale: 9/10  Worst: 10/10 Pain location: Both knees Pain description: sharp, dull, achy, etc. Aggravating  factors: Everything Relieving factors: Pain medication   PRECAUTIONS: None  RED FLAGS: None   WEIGHT BEARING RESTRICTIONS: No  FALLS:  Has patient fallen in last 6 months? No but reports losing balance a lot.   LIVING ENVIRONMENT: Lives with: lives with their partner Lives in: House/apartment Stairs: Yes, External, 3 stairs.  Has following equipment at home: Single point cane and Wheelchair (power)  OCCUPATION: None  PLOF: Independent  PATIENT GOALS: Wants to get back to a regular life. Cannot clean, cook because standing bothers her, play with her grand kids, etc.   OBJECTIVE:  Note: Objective measures were completed at Evaluation unless otherwise noted.  PATIENT SURVEYS:  FOTO 25% ODI: 34/50 - 68% disability  COGNITION: Overall cognitive status: Within functional limits for tasks assessed     SENSATION: WFL Loss of sensation of lateral knee after TKA  EDEMA:  Notable swelling in left knee  MUSCLE LENGTH: Hamstrings: Both tight   POSTURE: rounded shoulders and forward head  PALPATION: Tight hamstrings, edema in knee cap, tight gastroc.  LOWER EXTREMITY ROM:  Active ROM Right eval Left eval Left 08/01/23  Hip flexion     Hip extension     Hip abduction     Hip adduction     Hip internal rotation     Hip external rotation     Knee flexion 135 110 130  Knee extension -1 -1   Ankle dorsiflexion     Ankle plantarflexion     Ankle inversion     Ankle eversion      (Blank rows = not tested)  LOWER EXTREMITY MMT:  MMT Right eval Left eval  Hip flexion 4- 4-  Hip extension    Hip abduction 4- 3+  Hip adduction    Hip internal rotation    Hip external rotation    Knee flexion 4 4  Knee extension 4 4  Ankle dorsiflexion    Ankle plantarflexion    Ankle inversion    Ankle eversion     (Blank rows = not tested)  FUNCTIONAL TESTS:  30 seconds chair stand test: 9  GAIT: Distance walked: 20 ft Assistive device utilized: None Level of  assistance: Complete Independence Comments: Limited DF, limited extension, limited hip flexion, lack of pressure through L LE.                                                                                                                          TREATMENT: [facility] Adult PT Treatment:                                                 Therapeutic Exercise:  NuStep L5 x 4 min LE only while taking subjective Supine PPT x 10 - 5" hold Supine PPT with pilates ring 2x10 - 3" hold Supine QS x 10 - 5" hold L Supine pilates SLR 2x10 each  Pilates ring bridge 2x10 Standing hip abd/ext 2x10 25# each TKE with red power cord 2x15 L  Step ups 2x10 fwd 8in 1 UE support L leading ITB stretch with strap 2x30"  PATIENT EDUCATION:  Education details: continue HEP Person educated: Patient Education method: Explanation, Demonstration, Tactile cues, Verbal cues, and Handouts Education comprehension: verbalized understanding, returned demonstration, and needs further education  HOME EXERCISE PROGRAM: Access Code: [email or web address] URL: [email or web address] Date: 08/15/2023 Prepared by: Edwinna Areola  Exercises - Active Straight Leg Raise with Quad Set (Mirrored)  - 1 x daily - 7 x weekly - 3 sets - 10 reps - Long Sitting Calf Stretch with Strap  - 1 x daily - 7 x weekly - 3 reps - 30 sec hold - Seated Hamstring Stretch  - 1 x daily - 7  x weekly - 3 reps - 30 sec hold - Supine Hamstring Stretch with Strap  - 1 x daily - 7 x weekly - 3 reps - 30 sdc hold - Supine Posterior Pelvic Tilt  - 1 x daily - 7 x weekly - 2 sets - 10 reps - 3 sec hold - Supine Lower Trunk Rotation  - 1 x daily - 7 x weekly - 10 reps - 5 sec  hold - Supine Bridge  - 1 x daily - 7 x weekly - 2 sets - 10 reps - Supine ITB Stretch with Strap  - 1 x daily - 7 x weekly - 2-3 reps - 30 sec hold  ASSESSMENT:  CLINICAL IMPRESSION: Pt was able to complete all prescribed exercises with no adverse effect. Exercises focused on improving  core and proximal hip strength as well as L quad stability and knee ROM. IT band stretch added to HEP. Pt continues to progress well with therapy, will continue per POC.   EVAL: Patient is a 58 y.o. female who was seen today for physical therapy evaluation and treatment for bilateral knee pain, more in the left knee than right. Patient exhibits weakness in the LE and tightness, particularly in the hamstring and calf. FOTO score indicates sharp decrease in subjective functional ability [name] PLOF. Patient would benefit from skilled PT to decrease pain and increase functional ability.   OBJECTIVE IMPAIRMENTS: Abnormal gait, decreased activity tolerance, decreased balance, decreased ROM, and decreased strength.   ACTIVITY LIMITATIONS: carrying, lifting, bending, sitting, standing, squatting, stairs, and transfers  PARTICIPATION LIMITATIONS: meal prep, cleaning, laundry, driving, and community activity  PERSONAL FACTORS: Past/current experiences, Time since onset of injury/illness/exacerbation, and 3+ comorbidities: see PMH above are also affecting patient's functional outcome.   REHAB POTENTIAL: Good  CLINICAL DECISION MAKING: Stable/uncomplicated  EVALUATION COMPLEXITY: Moderate   GOALS: Goals reviewed with patient? Yes  SHORT TERM GOALS: Target date: 08/01/2023  Patient will be I with initial HEP in order to progress with therapy. Baseline: Goal status: MET  2.  Patient will score >/= 5/10 at worse on the NPS by 3rd visit in order to show an improvement in pain allowing for an increase in functional ability.   Baseline: 6/10 best Goal status: INITIAL  LONG TERM GOALS: Target date: 08/29/2023   Patient will be independent with final HEP in order to continue treatment at home for pain tolerance.   Baseline:  Goal status: INITIAL  2.  Patient will score >/= 4/10 at worse to show an improvement in pain allowing for an increase in functional ability.  Baseline: 6/10 best Goal  status: INITIAL  3.  Patient will score at least 45% on FOTO to signify clinically meaningful improvement in functional abilities.  Baseline: 25% Goal status: INITIAL  4.  Patient will get all MMT scores to a 4/5 on the L LE. Baseline: see above Goal status: INITIAL  5. Patient will increase 30 Second Sit to Stand rep count to no less than 11 reps for improved balance, strength, and functional mobility Baseline: 9 reps  Goal status: INITIAL   6. Pt will be decrease ODI disability score to no greater than 54% as proxy for functional improvement Baseline: 68% disability    Goal status: INITIAL   PLAN:  PT FREQUENCY: 1x/week  PT DURATION: 8 weeks  PLANNED INTERVENTIONS: 97110-Therapeutic exercises, 97530- Therapeutic activity, [id number]- Neuromuscular re-education, 97535- Self Care, [id number]- Manual therapy, [phone]- Gait training, [phone]- Vasopneumatic device, Patient/Family education, Balance training, Stair training, Joint  mobilization, Joint manipulation, Spinal manipulation, Spinal mobilization, Cryotherapy, and Moist heat  PLAN FOR NEXT SESSION: Continue working on strengthening of the quad, look at lower back next session.    Eloy End PT  08/15/23 2:44 PM

## 2023-08-16 DIAGNOSIS — G4733 Obstructive sleep apnea (adult) (pediatric): Secondary | ICD-10-CM | POA: Diagnosis not present

## 2023-08-19 ENCOUNTER — Other Ambulatory Visit: Payer: Self-pay | Admitting: Family

## 2023-08-19 DIAGNOSIS — I10 Essential (primary) hypertension: Secondary | ICD-10-CM

## 2023-08-19 DIAGNOSIS — L409 Psoriasis, unspecified: Secondary | ICD-10-CM

## 2023-08-19 DIAGNOSIS — R0789 Other chest pain: Secondary | ICD-10-CM

## 2023-08-19 DIAGNOSIS — R053 Chronic cough: Secondary | ICD-10-CM

## 2023-08-28 ENCOUNTER — Encounter: Payer: Medicare HMO | Attending: Physical Medicine and Rehabilitation | Admitting: Physical Medicine and Rehabilitation

## 2023-08-28 ENCOUNTER — Encounter: Payer: Self-pay | Admitting: Physical Medicine and Rehabilitation

## 2023-08-28 VITALS — BP 133/81 | HR 73 | Ht 67.0 in | Wt 167.0 lb

## 2023-08-28 DIAGNOSIS — M25562 Pain in left knee: Secondary | ICD-10-CM | POA: Diagnosis not present

## 2023-08-28 DIAGNOSIS — N94819 Vulvodynia, unspecified: Secondary | ICD-10-CM

## 2023-08-28 DIAGNOSIS — R5383 Other fatigue: Secondary | ICD-10-CM | POA: Diagnosis not present

## 2023-08-28 DIAGNOSIS — M25561 Pain in right knee: Secondary | ICD-10-CM | POA: Diagnosis not present

## 2023-08-28 DIAGNOSIS — R7303 Prediabetes: Secondary | ICD-10-CM

## 2023-08-28 DIAGNOSIS — M25559 Pain in unspecified hip: Secondary | ICD-10-CM | POA: Diagnosis not present

## 2023-08-28 DIAGNOSIS — R5382 Chronic fatigue, unspecified: Secondary | ICD-10-CM | POA: Diagnosis not present

## 2023-08-28 DIAGNOSIS — Z7409 Other reduced mobility: Secondary | ICD-10-CM

## 2023-08-28 DIAGNOSIS — G8929 Other chronic pain: Secondary | ICD-10-CM | POA: Diagnosis not present

## 2023-08-28 DIAGNOSIS — R7401 Elevation of levels of liver transaminase levels: Secondary | ICD-10-CM

## 2023-08-28 DIAGNOSIS — M545 Low back pain, unspecified: Secondary | ICD-10-CM | POA: Diagnosis not present

## 2023-08-28 DIAGNOSIS — L603 Nail dystrophy: Secondary | ICD-10-CM

## 2023-08-28 MED ORDER — TRAZODONE HCL 50 MG PO TABS: 50.0000 mg | ORAL_TABLET | Freq: Every evening | ORAL | 3 refills | Status: AC | PRN

## 2023-08-28 MED ORDER — GABAPENTIN 600 MG PO TABS: 600.0000 mg | ORAL_TABLET | Freq: Three times a day (TID) | ORAL | 3 refills | Status: DC

## 2023-08-28 MED ORDER — METHOCARBAMOL 500 MG PO TABS: 500.0000 mg | ORAL_TABLET | Freq: Three times a day (TID) | ORAL | 1 refills | Status: DC | PRN

## 2023-08-28 MED ORDER — MELOXICAM 15 MG PO TABS: 15.0000 mg | ORAL_TABLET | Freq: Every day | ORAL | 2 refills | Status: DC

## 2023-08-28 MED ORDER — GABAPENTIN 100 MG PO CAPS
200.0000 mg | ORAL_CAPSULE | Freq: Every day | ORAL | 1 refills | Status: DC
Start: 2023-08-28 — End: 2023-10-02

## 2023-08-28 NOTE — Addendum Note (Signed)
 Addended by: Horton Chin on: 08/28/2023 01:26 PM   Modules accepted: Orders

## 2023-08-28 NOTE — Progress Notes (Signed)
 Subjective:    Patient ID: Sharon [name], female    DOB: 1965-11-22, 58 y.o.   MRN: [id number]  HPI: Sharon [name] is a 58 year old woman who presents for f/u insomnia, bilateral knee pain following left knee replacement in 04/2019, transaminitis, shortness of breath, brain fog, and prediabetes.   1) Bilateral knee pain L>R: -right knee pain is getting worse -taking meloxicam as needed, she took it for three weeks and it caused breathing issues.  -has not tried tens unit before, her boy friend has one -pain was worse after changing her bed sheets -getting a new bike and she is hoping that strengthening her muscles will help -went to church a couple times but it hurt so badly. -right knee pain has been getting worse -Her ROM is still restricted in her left knee.  -ice helps -radiates between hip and left knee Feels like a charley horse -She asks whether the scar tissue will ever heal.  -Cannot sleep without a pillow underneath her legs.  -She has follow-up with Dr. Turner Daniels and Dr. Magnus Ivan -Currently is not planning to pursue surgery.  -She has been off hydrocodone.  -She would be interested in increasing the Gabapentin dose.  -She would like to discuss an option for inflammation. She has tried aspirations but the fluid comes back right away.  -She got a  Steroid injection and experienced worsening range of motion in her left knee- that was from Dr. Turner Daniels. He also pulled fluid off. It does help with pain, but she does not want another given her worsening range of motion afterward. She does have improved sensation in her knee after the injection! -has been hurting -discussed Qutenza as a treatment option -her current regimen is working for her.  -has been having digestive issues and stopped meloxicam.  -pain has been severe lately -she has decreased her robaxin to daily but this has not improved her dry mouth  2) Anxiety: This has much improved with the Gabapentin. 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  -she has used blue emu oil and diclofenac gel without great benefit -has appointment with physician today.  -she does use her hands a lot.  -she has been dropping things recently.   9) lower back pain -PT is working on lower back as well -has gotten worse -got Xrs and was shown to have cyst on right side and bulging disc on left side and pinched nerve in the middle.  -she feels a constant burning -worst on left side -feels she is walking crooked -she had an injection by Dr. Modesto Charon at orthopedics but these did not help.  -she know has a bone further up that is bothering her.  -left pain in her boy friend's car so had a hard time getting in today -hurts severely sometimes -pinches when she walks -sitting also hurts -she does not have insurance right now -ice helps -she uses heat for the muscular pain -hurt it while bending and turning -has been worse since she carried her grandchild -she is unable to vacuum.  -  discussed therapy and using lidocaine patch, massage.  -she is ok using gabapentin and her muscle relaxers.  -found lidocaine patch to be a lifesaver while she was at the beach.  -ready to try Qutenza today -has been severe recently -plans to follow-up with her orthopedic surgeon.   10) Constipation -has been needing to take laxatives.   11) shortness of breath -worried abut blot clots -she asks about results of her vascular ultrasound of her lower extremity and what treatment is necessary, how she would know if there are clots  elsewhere in her body, and how she may have developed these clots.  -she has being set up for tests  12) impaired memory -has improved since stopping klonopin/reduced added sugars in diet  13) Prediabetes -she asks about HgbA1c test result  14) Transaminitis -she asks whether any of her medications could have caused this'  15) Food allergy -she asks about her food allergy results  16) Insomnia: -asks if there is something else she can try for her insomnia -she has been trying to wean off her Klonopin but this makes it harder for her to fall asleep -the Requip caused dry mouth and she is hesitant about its potential side effects -she has not tried tart cherry juice or melatonin  17) Chest pain -has been radiating to left arm -she follows with a cardiologist Prior history:  Since last visit she has stopped Norco as it caused respiratory distress. We tried Cymbalta 40mg  without benefit or side effects. She has tried voltaren gel, blue emu oil without benefit. She has tried CBD.    She has been trying to improve quadriceps musculature. She discusses some of the exercises she does.   She has been denied in her disability. She has talked to an attorney.  She continues to ambulate with a cane.   She has tried the compounding cream and that does not help much. She has been trying Hemp cream which is helping.  She is getting foot insoles from the podiatrist. They should be ready in a week or two.   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   Can ride the stationary bicycle and that does not hurt. She sues it every day. She uses leg weights. When   Scar tissue grew back quickly post-surgery. She could not get PT appointments for 3 weeks.   Her surgeon does not want her to break the hardware and he may have to do arthroscopic procedure to flush out the inflammation.   She has  tried ice which stopped helping, nabumetone 750 mg BID, Norco 1-2 tablets three times per day PRN (she has 12 left). She uses diclofenac gel   She takes Miralax for constipation. She takes up to 2 times per day. She has a history of IBS.   [facility] in [place]- she was a Scientist, physiological. They let her go because she did not return in 12 weeks.   She was doing pool therapy but chemicals in the pool were increased because of COVID.  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 Notes reviewed- recently had a right subacromial injection.   Current pain is 8/10.   12) Hip pain -pain is best in the water -PT is going well -they are working on her steps- she is ok with small steps, but not big ones -she is able to squat a  little -muscles are building  13) Stress: -it was stressful for her to help plan her daughter's baby shower  14) Anxiety: -her boy friend is talking about leaving again -she has been having a lot of anxiety about this  15) Fatigue -PT has been going well   Pain Inventory Average Pain 8 Pain Right Now 7 My pain is constant, burning, dull, stabbing, and aching  In the last 24 hours, has pain interfered with the following? General activity 8 Relation with others 7 Enjoyment of life 7 What TIME of day is your pain at its worst? evening,  Sleep (in general) Fair  Pain is worse with: walking, bending, sitting, inactivity, and standing, some activities Pain improves with: rest, heat/ice, and medication Relief from Meds: 8     Family History  Problem Relation Age of Onset   Emphysema Mother        smoker   Allergies Mother    Asthma Mother    Heart disease Mother        AMI as cause of death   COPD Mother    Asthma Father    Heart disease Father        AMI as cause of death   Diabetes Father    Asthma Brother    Heart disease Brother 7       heart failure   Lung cancer Maternal Grandfather        was a smoker   Cancer Maternal  Grandfather        lung   Heart disease Paternal Grandmother        AMI   Heart disease Paternal Grandfather        AMI   Colon cancer Neg Hx    Social History   Socioeconomic History   Marital status: Divorced    Spouse name: Not on file   Number of children: 1   Years of education: Not on file   Highest education level: Not on file  Occupational History   Occupation: IT sales professional: [employer]  Tobacco Use   Smoking status: Never   Smokeless tobacco: Never  Vaping Use   Vaping status: Never Used  Substance and Sexual Activity   Alcohol use: Not Currently    Comment: occ   Drug use: No    Comment: former maryjuana use- quit in 1995   Sexual activity: Yes    Partners: Male    Birth control/protection: Surgical  Other Topics Concern   Not on file  Social History Narrative   Marital status: divorced x 2; dating seriously x 3 years.        Children:  1 daughter (14); no grandchildren      Lives:  With boyfriend.        Employment: works at [employer]      Tobacco: none      Alcohol: rarely; socially      Exercise: none; has treadmill and elliptical and bikes   Coffee in am / 1/2 of coke in afternoon    High school education      01/29/20   From: the area   Living: alone - but boyfriend living with her Genelle Bal (2020) but knew each other in high school   Work: on disability - working on appeal      Family: one daughter Leonette Nutting - one grandchild coming in January       Enjoys: relax and watch tv, use to like  fishing/boating/rafting, reading, crochet       Exercise: rehab exercises   Diet: not great - limited intake      Safety   Seat belts: Yes    Guns: Yes  and secure   Right handed   One story home   Drinks caffeine   Social Drivers of Health   Financial Resource Strain: Low Risk  (03/27/2023)   Overall Financial Resource Strain (CARDIA)    Difficulty of Paying Living Expenses: Not hard at all  Food Insecurity: No Food  Insecurity (03/27/2023)   Hunger Vital Sign    Worried About Running Out of Food in the Last Year: Never true    Ran Out of Food in the Last Year: Never true  Transportation Needs: No Transportation Needs (03/27/2023)   PRAPARE - Administrator, Civil Service (Medical): No    Lack of Transportation (Non-Medical): No  Physical Activity: Inactive (03/27/2023)   Exercise Vital Sign    Days of Exercise per Week: 0 days    Minutes of Exercise per Session: 0 min  Stress: No Stress Concern Present (03/27/2023)   [employer] of Occupational Health - Occupational Stress Questionnaire    Feeling of Stress : Not at all  Social Connections: Moderately Isolated (03/27/2023)   Social Connection and Isolation Panel [NHANES]    Frequency of Communication with Friends and Family: More than three times a week    Frequency of Social Gatherings with Friends and Family: More than three times a week    Attends Religious Services: More than 4 times per year    Active Member of [employer] or Organizations: No    Attends Engineer, structural: Never    Marital Status: Divorced   Past Surgical History:  Procedure Laterality Date   ABDOMINAL HYSTERECTOMY     still with both ovaries   BREAST BIOPSY Left 12/19/2022   [place] LT BREAST BX W LOC DEV 1ST LESION IMG BX SPEC US GUIDE 12/19/2022 [facility]   CATARACT EXTRACTION Bilateral 2015   COLONOSCOPY     CYSTOSCOPY W/ URETERAL STENT PLACEMENT Right 05/23/2017   Procedure: CYSTOSCOPY WITH RETROGRADE PYELOGRAM/URETERAL RIGHT STENT PLACEMENT;  Surgeon: Alfredo Martinez, MD;  Location: [facility];  Service: Urology;  Laterality: Right;   CYSTOSCOPY W/ URETERAL STENT PLACEMENT Right 06/04/2017   Procedure: CYSTOSCOPY WITHRIGHT RETROGRADE PYELOGRAMRIGHT /URETEREOSCOPY  STENT PLACEMENT;  Surgeon: Crista Elliot, MD;  Location: [facility];  Service: Urology;  Laterality: Right;   ENDOMETRIAL ABLATION     ESOPHAGOGASTRODUODENOSCOPY  (EGD) WITH PROPOFOL N/A 08/16/2022   Procedure: ESOPHAGOGASTRODUODENOSCOPY (EGD) WITH PROPOFOL;  Surgeon: Toney Reil, MD;  Location: [facility];  Service: Gastroenterology;  Laterality: N/A;   EYE SURGERY     HEMORRHOID SURGERY  2011   INCONTINENCE SURGERY     INNER EAR SURGERY     X 2   RECTAL PROLAPSE REPAIR     RETINAL DETACHMENT SURGERY Bilateral 2000   TOTAL KNEE ARTHROPLASTY Left 04/29/2019   Procedure: LEFT TOTAL KNEE ARTHROPLASTY;  Surgeon: Kathryne Hitch, MD;  Location: [facility];  Service: Orthopedics;  Laterality: Left;   TUBAL LIGATION     VEIN SURGERY Left 04/2010   Past Medical History:  Diagnosis Date   Allergy    Anal fissure    Anemia    borderline   Anxiety    Arthritis    Blood clot in vein    left leg   Detached retina  BOTH SCLERA BUCKLE BOTH EYES   DVT (deep venous thrombosis) (HCC)    left leg   Endometriosis    Family history of adverse reaction to anesthesia    DAUGHTER POST OP PONV   GERD (gastroesophageal reflux disease)    Glaucoma    RIGHT WORST THAN LEFT   Heart murmur    "caused by anxiety"    History of hemorrhoids    History of kidney stones    Hypertension    IBS (irritable bowel syndrome)    Perforated eardrum, right    SMALL HOLE   PONV (postoperative nausea and vomiting)    Pulmonary embolism (HCC) 6 YRS AGO   Status post arthroscopy of left knee 08/06/2017   LMP 10/11/2010   Opioid Risk Score:   Fall Risk Score:  `1  Depression screen PHQ 2/9     04/30/2023   12:20 PM 04/10/2023   12:54 PM 03/27/2023    2:11 PM 01/29/2023    2:27 PM 10/24/2022    2:28 PM 10/11/2022   10:15 AM 08/25/2022   10:50 AM  Depression screen PHQ 2/9  Decreased Interest 2 2 0 0  2 2  Down, Depressed, Hopeless 1 0 0 0  1 1  PHQ - 2 Score 3 2 0 0  3 3  Altered sleeping 0 1   1 0 3  Tired, decreased energy 3 3   1 3 3   Change in appetite 0 0    2 0  Feeling bad or failure about yourself  0 0    0 0  Trouble concentrating 0 0     0 0  Moving slowly or fidgety/restless 0 0    0 0  Suicidal thoughts 0 0    0 0  PHQ-9 Score 6 6    8 9   Difficult doing work/chores  Very difficult    Very difficult Extremely dIfficult   Review of Systems  Constitutional: Negative.        Weight loss  HENT: Negative.   Eyes: Negative.   Respiratory: Positive for shortness of breath.   Cardiovascular: Negative.   Gastrointestinal: Negative.   Endocrine: Negative.   Genitourinary: Negative.   Musculoskeletal: Positive for arthralgias, back pain and gait problem.       Spasms  Skin: Negative.   Allergic/Immunologic: Negative.   Neurological: Positive for tremors and numbness.  Hematological: Negative.   Psychiatric/Behavioral:       Anxiety  All other systems reviewed and are negative.      Objective:   Gen: no distress, normal appearing, BMI 26.16, weight 167 lbsm BP 133/81 HEENT: oral mucosa pink and moist, NCAT Cardio: Reg rate Chest: normal effort, normal rate of breathing Abd: soft, non-distended Ext: no edema Psych: pleasant, normal affect Skin: intact Neuro: Alert and oriented x3 MSK: ambulates with cane, decreased range of motion in bilateral knees  Assessment & Plan:  Mrs. Sharon [name] is a 58 year old woman who presents for follow-up:   1) Left knee pain post-surgery: -discussed that knee pain has worsened since she has been weaning off her medications to avoid their potential side effects -discussed that even activities such as changing her bed sheets can aggravate her pain -She has a plan for repeat surgical procedure with Dr. Magnus Ivan that will hopefully provide relief.  -She has been icing regularly- recommended three tines per day for 15 minutes. Discussed that this decreases blood flow and can impair healing but help with swelling and pain  relief.  -discussed the limitations of her pain on her function -discussed extracorporeal shockwave therapy and how this can help break apart scar tissue -unable to work  due the level of her pain -discussed current impact on her life -continue cane for balance -She has been through physical therapy and performs her exercises diligently. --Discussed Sprint PNS system as an option of pain treatment via neuromodulation. Provided following link for patient to learn more about the system: https://www.sprtherapeutics.com/. She does not like the feeling of things under her skin and defers that option.  -She weaned herself all the steroids.  -Provided with a pain relief journal and discussed that it contains foods and lifestyle tips to naturally help to improve pain. Discussed that these lifestyle strategies are also very good for health unlike some medications which can have negative side effects. Discussed that the act of keeping a journal can be therapeutic and helpful to realize patterns what helps to trigger and alleviate pain.   -discussed mechanism of action of low dose naltrexone as an opioid receptor antagonist which stimulates your body's production of its own natural endogenous opioids, helping to decrease pain. Discussed that it can also decrease T cell response and thus be helpful in decreasing inflammation, and symptoms of brain fog, fatigue, anxiety, depression, and allergies. Discussed that this medication needs to be compounded at a compounding pharmacy and can more expensive. Discussed that I usually start at 1mg  and if this is not providing enough relief then I titrate upward on a monthly basis.   -Discussed Qutenza as an option for neuropathic pain control. Discussed that this is a capsaicin patch, stronger than capsaicin cream. Discussed that it is currently approved for diabetic peripheral neuropathy and post-herpetic neuralgia, but that it has also shown benefit in treating other forms of neuropathy. Provided patient with link to site to learn more about the patch: https://www.clark.biz/. Discussed that the patch would be placed in office and benefits  usually last 3 months. Discussed that unintended exposure to capsaicin can cause severe irritation of eyes, mucous membranes, respiratory tract, and skin, but that Qutenza is a local treatment and does not have the systemic side effects of other nerve medications. Discussed that there may be pain, itching, erythema, and decreased sensory function associated with the application of Qutenza. Side effects usually subside within 1 week. A cold pack of analgesic medications can help with these side effects. Blood pressure can also be increased due to pain associated with administration of the patch.  -continue ice     -continue Robaxin as she is nervous about feeling too sleepy with the Tizanidine, and the Robaxin does help.   Continue gabapentin to 600mg  TID PRN.   -Continue neoprene knee sleeve for proprioception  -She has tried Celebrex and Tramadol without benefit. She had respiratory distress with hydrocodone.   -She had worse pain with steroid injection in her left knee.   -Discussed steroids but she did not like the side effect profile. Discussed the different steroid options. She weaned off these because she developed mouth sores.   -Continue turmeric and cherries.   -Continue Lidocaine patches today.   -She would like rheum panel checked: ordered, reviewed results which were negative, and discussed with patient.  -Refilled meloxicam.   2) Bunion:  -continue orthotics  3) Irritable bowel syndrome: -She does not eat much sugar or sweets.  -She drinks 1 coke per day.  -She cuts tea out.  -She drinks water, coffee.  -She has been going more regularly  and has not needed her Miralax.  -food allergy testing ordered  4) Impaired Concentration: - This persists -She forgets things easily.  -She tries not to multitask when she was with her grandmother.   5) Anxiety: The Gabapentin is helping with this as well, continue -discussed plan to start ecitalopram and that she has cleared  this with her ophthalmologist -discussed increasing trazodone -Buspar prescribed, discussed that she has not yet tried this but plans to  6) Vulvodynia: -discussed various treatment options: biofeedback, medications, continue estradiol.  -continue Amitriptyline 10mg  HS, discussed moving dose earlier in the evening.  -discussed that sex is still painful  7) Cervical and lumbar myofascial pain: -try massage -discussed benefits of therapy and trigger point injections -apply lidocaine patch.   8) Bilateral hand pain: likely secondary to arthritis compounded by carpal tunnel syndrome -continue Gabapentin which is helping -continue f/u with surgery as planned.   9) Low back pain, appears to be secondary to face arthritis -continue PT -discussed XR, but she defers due to potential cost -discussed surgery with an orthopedic and she would like to consider this but her insurance would not cover it.  -discussed lack of improvement with injections from Dr. Modesto Charon.  -continue lidocaine patches, discussed that these make her feel weird when she takes them off.  - Ketamine 10%, Baclofen 2%, Cyclobenzaprine 2%, Ketoprofen 10%, Gabapentin 6%, Bupivacaine 1%, Amitryptiline 5%, clonidine 0.2%, continue, discussed this is helpful  Dispense 240 g  Apply 1-2 g to affected area 3-4 times per day  -continue postural correction -Provided with a pain relief journal and discussed that it contains foods and lifestyle tips to naturally help to improve pain. Discussed that these lifestyle strategies are also very good for health unlike some medications which can have negative side effects. Discussed that the act of keeping a journal can be therapeutic and helpful to realize patterns what helps to trigger and alleviate pain.   -Discussed following foods that may reduce pain: 1) Ginger (especially studied for arthritis)- reduce leukotriene production to decrease inflammation 2) Blueberries- high in phytonutrients  that decrease inflammation 3) Salmon- marine omega-3s reduce joint swelling and pain 4) Pumpkin seeds- reduce inflammation 5) dark chocolate- reduces inflammation 6) turmeric- reduces inflammation 7) tart cherries - reduce pain and stiffness 8) extra virgin olive oil - its compound olecanthal helps to block prostaglandins  9) chili peppers- can be eaten or applied topically via capsaicin 10) mint- helpful for headache, muscle aches, joint pain, and itching 11) garlic- reduces inflammation  Link to further information on diet for chronic pain: http://www.bray.com/   10) shoulder pain -used to get shots in the pt, but laying on her back has helped to decrease her shoulder pain  11) Acid reflux -continues famotidine.   11) Soreness in legs -vascular ultrasound ordered of both legs -discussed her history of clots -discussed that a D-dimer test can suggest clot if elevated.   12) Irritable bowel syndrome -discussed her current diet -discussed that she does not eat breakfast due to nausea -discussed her diarrhea and constipation, more diarrheal type at this time.  -discussed her aunt's advise to have a tsp coconut oil daily.  -recommended bioptemizer's magnesium breakthrough -reviewed food allergies with her and made goal for 4 week elimination of foods with the highest responses  13) Insomnia/Shortness of breath with history of clots -discussed that there is no evidence of PE on her CTA -discussed her follow-up with PCP -discussed that her vascular ultrasound of her lower extremities shows a  chronic left peroneal CVT  -recommended starting nattokinase to help thin the blood and lower her risk for clots, or to eat natto -discussed the importance of movement in clot prevention -referred for sleep study for shortness of breath  14) Impaired memory: -discussed her memory difficulties. -discussed that memory is  improving since stopping her klonopin and decreasing added sugar in her diet -discussed that some studies have linked chronic PPI use to memory impairment -discussed safe way to wean off these medications slowly  15) Hemorroids: Recommended Epsom salt baths  16) prediabetes -advised switching from sweetened black tea to black tea with allulose or honey - recommended avoiding processed foods/added sugars/bread/pasta/rice -recommended drinking a glass of water with either apple cide vinegar or lemon -recommended reading Wheat Belly and Grain Brain -recommended eating fruits, vegetables, yogurt, nuts, olive oil -discussed risks and benefits of metformin -discussed that repeat labs show normalization of HgbA1c! -discussed that she does not eat much anymore -encouraged getting enough protein -HgbA1c reviewed and is 5.8, discussed with patient -discussed that it is hard to maintain a good diet on the holidays -HgbA1c ordered  17) Transaminitis -recommended avoiding ultraprocessed foods -recommended avoiding/minimizing tylenol -recommended supplement NAC 600mg  BID  -recommended drinking dandelion and milk thistle teas -commended on stopping drinking soda products! -recommended avoiding processed creamer in coffee, continue drinking coffee -discussed that repeat CMP shows normalization of liver enzymes! -CMP ordered  18) Bilateral knee pain: -encouraged use of exercise bike to strengthen quadriceps tendon -continue robaxin, meloxciam -continue PT -discussed tens unit -discussed how the pain limits her ability work  19) Dry mouth: -stop amitriptyline  20) Benzodiazepine withdrawal: -discussed that this should be weaned slowly  31) Restless leg syndrome:  -discussed requip, will prescribe  32) Insomnia: -prescribed trazodone, discussed that this promotes increased deep sleep, discussed that this has helped -recommended tart cherry juice -discussed that she has been on Klonopin  for 12 years and is trying to wean off as her PCP is concerned it may be contributing to her brain fog -discussed that Klonopin can contribute to memory decline and commended her for trying to wean off -discussed that she has been having dry mouth and she is not sure if it is from the Requip. She has stopped taking this in case and dry mouth has continued -discussed that robaxin can also cause dry mouth -discussed taking muscle relaxer earlier in the evening so she does not have to wake at night to drink water  33) Chest pain: -discussed current symptoms -prescribed nitroglycerin  34) Elevated BUN -encouraged 6-8 glasses of water per day -discussed that creatinine is normal  35. Diffuse arthritis: -discussed potential benefits of boswellia -continue compounded cream, discussed potentially alternating with blue emu oil -discussed aquatherapy -prescribed PT  36. Left hip pain: -continue PT  37. Impaired balance: -continue PT  38. Fatigue:  -recommended time outdoors   39. Brittle nails: -recommended collagen and 2 [place] nuts per day -TSH, T3, T4 ordered

## 2023-08-28 NOTE — Addendum Note (Signed)
 Addended by: Horton Chin on: 08/28/2023 01:27 PM   Modules accepted: Orders

## 2023-09-05 ENCOUNTER — Ambulatory Visit: Payer: Medicare HMO | Attending: Physical Medicine and Rehabilitation

## 2023-09-05 DIAGNOSIS — M6281 Muscle weakness (generalized): Secondary | ICD-10-CM | POA: Insufficient documentation

## 2023-09-05 DIAGNOSIS — R7303 Prediabetes: Secondary | ICD-10-CM | POA: Diagnosis not present

## 2023-09-05 DIAGNOSIS — M25562 Pain in left knee: Secondary | ICD-10-CM | POA: Diagnosis not present

## 2023-09-05 DIAGNOSIS — R2689 Other abnormalities of gait and mobility: Secondary | ICD-10-CM | POA: Diagnosis not present

## 2023-09-05 DIAGNOSIS — L603 Nail dystrophy: Secondary | ICD-10-CM | POA: Diagnosis not present

## 2023-09-05 DIAGNOSIS — G8929 Other chronic pain: Secondary | ICD-10-CM | POA: Insufficient documentation

## 2023-09-05 DIAGNOSIS — R7401 Elevation of levels of liver transaminase levels: Secondary | ICD-10-CM | POA: Diagnosis not present

## 2023-09-05 NOTE — Therapy (Signed)
 OUTPATIENT PHYSICAL THERAPY TREATMENT   Patient Name: Sharon [name] MRN: [id number] DOB:07-17-1965, 58 y.o., female Today's Date: 09/05/2023  END OF SESSION:  PT End of Session - 09/05/23 1456     Visit Number 8    Number of Visits 15    Date for PT Re-Evaluation 10/18/23    Authorization Type [employer]    PT Start Time 1453   arrived late   Activity Tolerance Patient tolerated treatment well    Behavior During Therapy [facility] for tasks assessed/performed                   Past Medical History:  Diagnosis Date   Allergy    Anal fissure    Anemia    borderline   Anxiety    Arthritis    Blood clot in vein    left leg   Detached retina    BOTH SCLERA BUCKLE BOTH EYES   DVT (deep venous thrombosis) (HCC)    left leg   Endometriosis    Family history of adverse reaction to anesthesia    DAUGHTER POST OP PONV   GERD (gastroesophageal reflux disease)    Glaucoma    RIGHT WORST THAN LEFT   Heart murmur    "caused by anxiety"    History of hemorrhoids    History of kidney stones    Hypertension    IBS (irritable bowel syndrome)    Perforated eardrum, right    SMALL HOLE   PONV (postoperative nausea and vomiting)    Pulmonary embolism (HCC) 6 YRS AGO   Status post arthroscopy of left knee 08/06/2017   Past Surgical History:  Procedure Laterality Date   ABDOMINAL HYSTERECTOMY     still with both ovaries   BREAST BIOPSY Left 12/19/2022   [place] LT BREAST BX W LOC DEV 1ST LESION IMG BX SPEC US GUIDE 12/19/2022 [facility]   CATARACT EXTRACTION Bilateral 2015   COLONOSCOPY     CYSTOSCOPY W/ URETERAL STENT PLACEMENT Right 05/23/2017   Procedure: CYSTOSCOPY WITH RETROGRADE PYELOGRAM/URETERAL RIGHT STENT PLACEMENT;  Surgeon: Alfredo Martinez, MD;  Location: [facility];  Service: Urology;  Laterality: Right;   CYSTOSCOPY W/ URETERAL STENT PLACEMENT Right 06/04/2017   Procedure: CYSTOSCOPY WITHRIGHT RETROGRADE PYELOGRAMRIGHT /URETEREOSCOPY  STENT PLACEMENT;   Surgeon: Crista Elliot, MD;  Location: [facility];  Service: Urology;  Laterality: Right;   ENDOMETRIAL ABLATION     ESOPHAGOGASTRODUODENOSCOPY (EGD) WITH PROPOFOL N/A 08/16/2022   Procedure: ESOPHAGOGASTRODUODENOSCOPY (EGD) WITH PROPOFOL;  Surgeon: Toney Reil, MD;  Location: [facility];  Service: Gastroenterology;  Laterality: N/A;   EYE SURGERY     HEMORRHOID SURGERY  2011   INCONTINENCE SURGERY     INNER EAR SURGERY     X 2   RECTAL PROLAPSE REPAIR     RETINAL DETACHMENT SURGERY Bilateral 2000   TOTAL KNEE ARTHROPLASTY Left 04/29/2019   Procedure: LEFT TOTAL KNEE ARTHROPLASTY;  Surgeon: Kathryne Hitch, MD;  Location: [facility];  Service: Orthopedics;  Laterality: Left;   TUBAL LIGATION     VEIN SURGERY Left 04/2010   Patient Active Problem List   Diagnosis Date Noted   Sore throat 04/30/2023   Acute non-recurrent maxillary sinusitis 04/30/2023   Cystitis 04/30/2023   History of DVT (deep vein thrombosis) 04/10/2023   Plaque psoriasis 04/10/2023   Right corneal abrasion 04/10/2023   Dysuria 10/18/2022   Multiple food allergies 10/12/2022   Chronic GERD 08/16/2022   Mild sleep apnea  07/26/2022   Prediabetes 04/13/2022   Palpitations 04/11/2022   Psoriasis of scalp 03/28/2022   Anemia due to vitamin B12 deficiency 03/28/2022   Reflux laryngitis 03/18/2022   Restless leg 03/18/2022   History of endometriosis 03/07/2022   Irritable bowel syndrome with both constipation and diarrhea 03/07/2022   Bronchiolitis 03/07/2022   Chronic fatigue 03/07/2022   Loud snoring 03/07/2022   Reactive airway disease 10/07/2020   Gastroesophageal reflux disease 04/13/2020   Chronic prescription benzodiazepine use 04/13/2020   Abnormal cervical Papanicolaou smear 03/31/2020   Pituitary microadenoma (HCC) 03/31/2020   Endometriosis 03/31/2020   H/O: hysterectomy 03/31/2020   Insomnia 02/03/2020   Pain in female genitalia on intercourse 01/29/2020    Impingement syndrome of right shoulder 10/28/2018   Chronic pain of left knee 03/27/2018   Chronic right shoulder pain 03/27/2018   Cervicalgia 03/27/2018   Unilateral primary osteoarthritis, left knee 08/06/2017   Tremor 07/10/2017   Trochanteric bursitis, right hip 09/27/2016   GAD (generalized anxiety disorder) 05/31/2014   Rectocele 08/21/2011   Acute cough 10/25/2010   Hypertension 10/13/2010    PCP: Mort Sawyers, FNP  REFERRING PROVIDER: Horton Chin, MD  REFERRING DIAG: M25.561,M25.562,G89.29 (ICD-10-CM) - Chronic pain of both knees   THERAPY DIAG:  Muscle weakness (generalized) - Plan: PT plan of care cert/re-cert  Chronic pain of left knee - Plan: PT plan of care cert/re-cert  Other abnormalities of gait and mobility - Plan: PT plan of care cert/re-cert  Rationale for Evaluation and Treatment: Rehabilitation  ONSET DATE: Chronic, 2019  SUBJECTIVE:   SUBJECTIVE STATEMENT: Pt is presents to PT with continued reports of pain. Has continued HEP compliance with no adverse effect.   EVAL: Pt comes into PT using a single point cane with c/c of bilateral knee pain that she rates a 6/10 at best. Pt had a L TKA in 2020 which she did PT for but says she struggles with bending the L knee. Pt largest concern is inflammation and tightness. Describes knee pain as sharp, dull, achy, etc. Dependent on the time. Additionally has concerns with her left quad which she says cannot take any loading or pressure. Patient reports she bruises very easily and experiences a lot of pain. Challenges include transferring from the floor, stairs, squatting, walking, etc. Has worn orthotic inserts and wears ASICS or Brooks. Patient took pain medicine before treatment session which she takes 3 times a day on a regular basis. Patient wants to get her back looked at as well.   PERTINENT HISTORY: TKA 2020 Depression PAIN:  Are you having pain?  Yes: NPRS scale: 8/10  Worst: 10/10 Pain  location: Both knees Pain description: sharp, dull, achy, etc. Aggravating factors: Everything Relieving factors: Pain medication   PRECAUTIONS: None  RED FLAGS: None   WEIGHT BEARING RESTRICTIONS: No  FALLS:  Has patient fallen in last 6 months? No but reports losing balance a lot.   LIVING ENVIRONMENT: Lives with: lives with their partner Lives in: House/apartment Stairs: Yes, External, 3 stairs.  Has following equipment at home: Single point cane and Wheelchair (power)  OCCUPATION: None  PLOF: Independent  PATIENT GOALS: Wants to get back to a regular life. Cannot clean, cook because standing bothers her, play with her grand kids, etc.   OBJECTIVE:  Note: Objective measures were completed at Evaluation unless otherwise noted.  PATIENT SURVEYS:  FOTO 25% ODI: 34/50 - 68% disability  COGNITION: Overall cognitive status: Within functional limits for tasks assessed     SENSATION:  WFL Loss of sensation of lateral knee after TKA  EDEMA:  Notable swelling in left knee  MUSCLE LENGTH: Hamstrings: Both tight   POSTURE: rounded shoulders and forward head  PALPATION: Tight hamstrings, edema in knee cap, tight gastroc.  LOWER EXTREMITY ROM:  Active ROM Right eval Left eval Left 08/01/23  Hip flexion     Hip extension     Hip abduction     Hip adduction     Hip internal rotation     Hip external rotation     Knee flexion 135 110 130  Knee extension -1 -1   Ankle dorsiflexion     Ankle plantarflexion     Ankle inversion     Ankle eversion      (Blank rows = not tested)  LOWER EXTREMITY MMT:  MMT Right eval Left eval  Hip flexion 4- 4-  Hip extension    Hip abduction 4- 3+  Hip adduction    Hip internal rotation    Hip external rotation    Knee flexion 4 4  Knee extension 4 4  Ankle dorsiflexion    Ankle plantarflexion    Ankle inversion    Ankle eversion     (Blank rows = not tested)  FUNCTIONAL TESTS:  30 seconds chair stand test:  9  GAIT: Distance walked: 20 ft Assistive device utilized: None Level of assistance: Complete Independence Comments: Limited DF, limited extension, limited hip flexion, lack of pressure through L LE.                                                                                                                          TREATMENT: [facility] Adult PT Treatment:                                                 Therapeutic Exercise:  NuStep L5 x 4 min LE only while taking subjective Supine QS x 10 - 5" hold Supine pilates SLR 2x10 Pilates ring bridge 3x10 Supine DKTC with physioball x 15 Standing hip abd 2x10 25# each TKE with ball 2x10 L - 5" hold Rows 2x10 blue band  PATIENT EDUCATION:  Education details: continue HEP Person educated: Patient Education method: Explanation, Demonstration, Tactile cues, Verbal cues, and Handouts Education comprehension: verbalized understanding, returned demonstration, and needs further education  HOME EXERCISE PROGRAM: Access Code: [email or web address] URL: [email or web address] Date: 08/15/2023 Prepared by: Edwinna Areola  Exercises - Active Straight Leg Raise with Quad Set (Mirrored)  - 1 x daily - 7 x weekly - 3 sets - 10 reps - Long Sitting Calf Stretch with Strap  - 1 x daily - 7 x weekly - 3 reps - 30 sec hold - Seated Hamstring Stretch  - 1 x daily - 7 x weekly - 3 reps - 30 sec hold - Supine Hamstring Stretch with  Strap  - 1 x daily - 7 x weekly - 3 reps - 30 sdc hold - Supine Posterior Pelvic Tilt  - 1 x daily - 7 x weekly - 2 sets - 10 reps - 3 sec hold - Supine Lower Trunk Rotation  - 1 x daily - 7 x weekly - 10 reps - 5 sec  hold - Supine Bridge  - 1 x daily - 7 x weekly - 2 sets - 10 reps - Supine ITB Stretch with Strap  - 1 x daily - 7 x weekly - 2-3 reps - 30 sec hold  ASSESSMENT:  CLINICAL IMPRESSION: Pt was able to complete prescribed exercises with no adverse effect. Therapy today focused on improving LE strength and core  endurance/motor control. Pt continues to demonsteates L LE weakness and neutral spine core weakness. Does continue to benefit from skilled PT services, will continue to progress as able per POC.   EVAL: Patient is a 57 y.o. female who was seen today for physical therapy evaluation and treatment for bilateral knee pain, more in the left knee than right. Patient exhibits weakness in the LE and tightness, particularly in the hamstring and calf. FOTO score indicates sharp decrease in subjective functional ability below PLOF. Patient would benefit from skilled PT to decrease pain and increase functional ability.   OBJECTIVE IMPAIRMENTS: Abnormal gait, decreased activity tolerance, decreased balance, decreased ROM, and decreased strength.   ACTIVITY LIMITATIONS: carrying, lifting, bending, sitting, standing, squatting, stairs, and transfers  PARTICIPATION LIMITATIONS: meal prep, cleaning, laundry, driving, and community activity  PERSONAL FACTORS: Past/current experiences, Time since onset of injury/illness/exacerbation, and 3+ comorbidities: see PMH above are also affecting patient's functional outcome.   REHAB POTENTIAL: Good  CLINICAL DECISION MAKING: Stable/uncomplicated  EVALUATION COMPLEXITY: Moderate   GOALS: Goals reviewed with patient? Yes  SHORT TERM GOALS: Target date: 08/01/2023  Patient will be I with initial HEP in order to progress with therapy. Baseline: Goal status: MET  2.  Patient will score >/= 5/10 at worse on the NPS by 3rd visit in order to show an improvement in pain allowing for an increase in functional ability.   Baseline: 6/10 best Goal status: ONGOING  LONG TERM GOALS: Target date: 10/18/2023   Patient will be independent with final HEP in order to continue treatment at home for pain tolerance.   Baseline:  Goal status: INITIAL  2.  Patient will score >/= 4/10 at worse to show an improvement in pain allowing for an increase in functional ability.   Baseline: 6/10 best Goal status: INITIAL  3.  Patient will score at least 45% on FOTO to signify clinically meaningful improvement in functional abilities.  Baseline: 25% Goal status: INITIAL  4.  Patient will get all MMT scores to a 4/5 on the L LE. Baseline: see above Goal status: INITIAL  5. Patient will increase 30 Second Sit to Stand rep count to no less than 11 reps for improved balance, strength, and functional mobility Baseline: 9 reps  Goal status: INITIAL   6. Pt will be decrease ODI disability score to no greater than 54% as proxy for functional improvement Baseline: 68% disability    Goal status: INITIAL   PLAN:  PT FREQUENCY: 1x/week  PT DURATION: 8 weeks  PLANNED INTERVENTIONS: 97110-Therapeutic exercises, 97530- Therapeutic activity, 97112- Neuromuscular re-education, 97535- Self Care, 40102- Manual therapy, [phone]- Gait training, [phone]- Vasopneumatic device, Patient/Family education, Balance training, Stair training, Joint mobilization, Joint manipulation, Spinal manipulation, Spinal mobilization, Cryotherapy, and Moist heat  PLAN FOR NEXT SESSION: Continue working on strengthening of the quad, look at lower back next session.    Eloy End PT  09/05/23 4:30 PM

## 2023-09-06 ENCOUNTER — Encounter: Admitting: Physical Medicine and Rehabilitation

## 2023-09-06 LAB — COMPREHENSIVE METABOLIC PANEL
ALT: 16 IU/L (ref 0–32)
AST: 17 IU/L (ref 0–40)
Albumin: 4.8 g/dL (ref 3.8–4.9)
Alkaline Phosphatase: 105 IU/L (ref 44–121)
BUN/Creatinine Ratio: 22 (ref 9–23)
BUN: 17 mg/dL (ref 6–24)
Bilirubin Total: 0.3 mg/dL (ref 0.0–1.2)
CO2: 25 mmol/L (ref 20–29)
Calcium: 9.8 mg/dL (ref 8.7–10.2)
Chloride: 101 mmol/L (ref 96–106)
Creatinine, Ser: 0.76 mg/dL (ref 0.57–1.00)
Globulin, Total: 2.5 g/dL (ref 1.5–4.5)
Glucose: 104 mg/dL — ABNORMAL HIGH (ref 70–99)
Potassium: 4.9 mmol/L (ref 3.5–5.2)
Sodium: 141 mmol/L (ref 134–144)
Total Protein: 7.3 g/dL (ref 6.0–8.5)
eGFR: 90 mL/min/{1.73_m2} (ref >59)

## 2023-09-06 LAB — HEMOGLOBIN A1C
Est. average glucose Bld gHb Est-mCnc: 120 mg/dL
Hgb A1c MFr Bld: 5.8 % — ABNORMAL HIGH (ref 4.8–5.6)

## 2023-09-06 LAB — TSH: TSH: 1.64 u[IU]/mL (ref 0.450–4.500)

## 2023-09-06 LAB — T4: T4, Total: 7.2 ug/dL (ref 4.5–12.0)

## 2023-09-06 LAB — T3: T3, Total: 120 ng/dL (ref 71–180)

## 2023-09-07 ENCOUNTER — Encounter (HOSPITAL_BASED_OUTPATIENT_CLINIC_OR_DEPARTMENT_OTHER): Admitting: Physical Medicine and Rehabilitation

## 2023-09-07 DIAGNOSIS — R7303 Prediabetes: Secondary | ICD-10-CM | POA: Diagnosis not present

## 2023-09-07 DIAGNOSIS — L603 Nail dystrophy: Secondary | ICD-10-CM

## 2023-09-07 DIAGNOSIS — M25562 Pain in left knee: Secondary | ICD-10-CM | POA: Diagnosis not present

## 2023-09-07 DIAGNOSIS — M25561 Pain in right knee: Secondary | ICD-10-CM | POA: Diagnosis not present

## 2023-09-07 DIAGNOSIS — G8929 Other chronic pain: Secondary | ICD-10-CM

## 2023-09-07 NOTE — Progress Notes (Signed)
 Subjective:    Patient ID: Sharon [name], female    DOB: 09/25/1965, 58 y.o.   MRN: [id number]  HPI: An audio/video tele-health visit is felt to be the most appropriate encounter for this patient at this time. This is a follow up tele-visit via phone. The patient is at home. MD is at office. Prior to scheduling this appointment, our staff discussed the limitations of evaluation and management by telemedicine and the availability of in-person appointments. The patient expressed understanding and agreed to proceed.   Sharon [name] is a 58 year old woman who presents for f/u insomnia, bilateral knee pain following left knee replacement in 04/2019, transaminitis, shortness of breath, brain fog, and prediabetes.   1) Bilateral knee pain L>R: -she is interested in seeing Dr. Steward Drone for a second opinion after her vacation in June -right knee pain is getting worse -taking meloxicam as needed, she took it for three weeks and it caused breathing issues.  -has not tried tens unit before, her boy friend has one -pain was worse after changing her bed sheets -getting a new bike and she is hoping that strengthening her muscles will help -went to church a couple times but it hurt so badly. -right knee pain has been getting worse -Her ROM is still restricted in her left knee.  -ice helps -radiates between hip and left knee Feels like a charley horse -She asks whether the scar tissue will ever heal.  -Cannot sleep without a pillow underneath her legs.  -She has follow-up with Dr. Turner Daniels and Dr. Magnus Ivan -Currently is not planning to pursue surgery.  -She has been off hydrocodone.  -She would be interested in increasing the Gabapentin dose.  -She would like to discuss an option for inflammation. She has tried aspirations but the fluid comes back right away.  -She got a  Steroid injection and experienced worsening range of motion in her left knee- that was from Dr. Turner Daniels. He also pulled fluid off.  It does help with pain, but she does not want another given her worsening range of motion afterward. She does have improved sensation in her knee after the injection! -has been hurting -discussed Qutenza as a treatment option -her current regimen is working for her.  -has been having digestive issues and stopped meloxicam.  -pain has been severe lately -she has decreased her robaxin to daily but this has not improved her dry mouth  2) Anxiety: This has much improved with the Gabapentin. 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  -she has used blue emu oil and diclofenac gel without great benefit -has appointment with physician today.  -she does use her hands a lot.  -she has been dropping things recently.   9) lower back pain -PT is working on lower back as well -has gotten worse -got Xrs and was shown to have cyst on right side and bulging disc on left side and pinched nerve in the middle.  -she feels a constant burning -worst on left side -feels she is walking crooked -she had an injection by  Dr. Modesto Charon at orthopedics but these did not help.  -she know has a bone further up that is bothering her.  -left pain in her boy friend's car so had a hard time getting in today -hurts severely sometimes -pinches when she walks -sitting also hurts -she does not have insurance right now -ice helps -she uses heat for the muscular pain -hurt it while bending and turning -has been worse since she carried her grandchild -she is unable to vacuum.  -discussed therapy and using lidocaine  patch, massage.  -she is ok using gabapentin and her muscle relaxers.  -found lidocaine patch to be a lifesaver while she was at the beach.  -ready to try Qutenza today -has been severe recently -plans to follow-up with her orthopedic surgeon.   10) Constipation -has been needing to take laxatives.   11) shortness of breath -worried abut blot clots -she asks about results of her vascular ultrasound of her lower extremity and what treatment is necessary, how she would know if there are clots elsewhere in her body, and how she may have developed these clots.  -she has being set up for tests  12) impaired memory -has improved since stopping klonopin/reduced added sugars in diet  13) Prediabetes -she asks about HgbA1c test result  14) Transaminitis -she asks whether any of her medications could have caused this'  15) Food allergy -she asks about her food allergy results  16) Insomnia: -asks if there is something else she can try for her insomnia -she has been trying to wean off her Klonopin but this makes it harder for her to fall asleep -the Requip caused dry mouth and she is hesitant about its potential side effects -she has not tried tart cherry juice or melatonin  17) Chest pain -has been radiating to left arm -she follows with a cardiologist Prior history:  Since last visit she has stopped Norco as it caused respiratory distress. We tried Cymbalta 40mg  without benefit or side effects. She has tried voltaren gel, blue emu oil without benefit. She has tried CBD.    She has been trying to improve quadriceps musculature. She discusses some of the exercises she does.   She has been denied in her disability. She has talked to an attorney.  She continues to ambulate with a cane.   She has tried the compounding cream and that does not help much. She has been trying Hemp cream which is helping.  She is getting foot insoles from the podiatrist. They should be ready in a week or  two.   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   Can ride the stationary bicycle and that does not hurt. She sues it every day. She uses leg weights. When   Scar tissue grew back quickly post-surgery. She could not get PT appointments for 3 weeks.   Her surgeon does not want her to break the hardware and he may have to do arthroscopic procedure to flush out the inflammation.   She has tried ice which stopped helping, nabumetone 750 mg BID, Norco 1-2 tablets three times per day PRN (she has 12 left). She uses diclofenac gel   She takes Miralax for constipation. She takes up to 2 times per day. She has a history of IBS.   [facility] in [place]- she was a Scientist, physiological. They let her go because she did not  return in 12 weeks.   She was doing pool therapy but chemicals in the pool were increased because of COVID.  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 Notes reviewed- recently had a right subacromial injection.   Current pain is 8/10.   12) Hip pain -pain is best in the water -PT is going well -they are working on her steps- she is ok with small steps, but not big ones -she is able to squat a little -muscles are building  13) Stress: -it was stressful for her to help plan her daughter's baby shower  14) Anxiety: -her boy friend is talking about leaving again -she has been having a lot of anxiety about this  15) Fatigue -PT has been going well   Pain Inventory Average Pain 8 Pain Right Now 7 My pain is constant, burning, dull, stabbing, and aching  In the last 24 hours, has pain interfered with the following? General activity 8 Relation with others 7 Enjoyment of life 7 What TIME of day is your pain at its worst? evening,  Sleep (in general) Fair  Pain is worse with: walking, bending, sitting, inactivity, and standing, some activities Pain improves  with: rest, heat/ice, and medication Relief from Meds: 8     Family History  Problem Relation Age of Onset   Emphysema Mother        smoker   Allergies Mother    Asthma Mother    Heart disease Mother        AMI as cause of death   COPD Mother    Asthma Father    Heart disease Father        AMI as cause of death   Diabetes Father    Asthma Brother    Heart disease Brother 38       heart failure   Lung cancer Maternal Grandfather        was a smoker   Cancer Maternal Grandfather        lung   Heart disease Paternal Grandmother        AMI   Heart disease Paternal Grandfather        AMI   Colon cancer Neg Hx    Social History   Socioeconomic History   Marital status: Divorced    Spouse name: Not on file   Number of children: 1   Years of education: Not on file   Highest education level: Not on file  Occupational History   Occupation: IT sales professional: [employer]  Tobacco Use   Smoking status: Never   Smokeless tobacco: Never  Vaping Use   Vaping status: Never Used  Substance and Sexual Activity   Alcohol use: Not Currently    Comment: occ   Drug use: No    Comment: former maryjuana use- quit in 1995   Sexual activity: Yes    Partners: Male    Birth control/protection: Surgical  Other Topics Concern   Not on file  Social History Narrative   Marital status: divorced x 2; dating seriously x 3 years.        Children:  1 daughter (72); no grandchildren      Lives:  With boyfriend.        Employment: works at [employer]      Tobacco: none      Alcohol: rarely; socially      Exercise: none; has treadmill and elliptical and bikes   Coffee  in am / 1/2 of coke in afternoon    High school education      01/29/20   From: the area   Living: alone - but boyfriend living with her Genelle Bal (2020) but knew each other in high school   Work: on disability - working on appeal      Family: one daughter Leonette Nutting - one grandchild coming in  January       Enjoys: relax and watch tv, use to like fishing/boating/rafting, reading, crochet       Exercise: rehab exercises   Diet: not great - limited intake      Safety   Seat belts: Yes    Guns: Yes  and secure   Right handed   One story home   Drinks caffeine   Social Drivers of Corporate investment banker Strain: Low Risk  (03/27/2023)   Overall Financial Resource Strain (CARDIA)    Difficulty of Paying Living Expenses: Not hard at all  Food Insecurity: No Food Insecurity (03/27/2023)   Hunger Vital Sign    Worried About Running Out of Food in the Last Year: Never true    Ran Out of Food in the Last Year: Never true  Transportation Needs: No Transportation Needs (03/27/2023)   PRAPARE - Administrator, Civil Service (Medical): No    Lack of Transportation (Non-Medical): No  Physical Activity: Inactive (03/27/2023)   Exercise Vital Sign    Days of Exercise per Week: 0 days    Minutes of Exercise per Session: 0 min  Stress: No Stress Concern Present (03/27/2023)   [employer] of Occupational Health - Occupational Stress Questionnaire    Feeling of Stress : Not at all  Social Connections: Moderately Isolated (03/27/2023)   Social Connection and Isolation Panel [NHANES]    Frequency of Communication with Friends and Family: More than three times a week    Frequency of Social Gatherings with Friends and Family: More than three times a week    Attends Religious Services: More than 4 times per year    Active Member of [employer] or Organizations: No    Attends Engineer, structural: Never    Marital Status: Divorced   Past Surgical History:  Procedure Laterality Date   ABDOMINAL HYSTERECTOMY     still with both ovaries   BREAST BIOPSY Left 12/19/2022   [place] LT BREAST BX W LOC DEV 1ST LESION IMG BX SPEC US GUIDE 12/19/2022 [facility]   CATARACT EXTRACTION Bilateral 2015   COLONOSCOPY     CYSTOSCOPY W/ URETERAL STENT PLACEMENT Right 05/23/2017    Procedure: CYSTOSCOPY WITH RETROGRADE PYELOGRAM/URETERAL RIGHT STENT PLACEMENT;  Surgeon: Alfredo Martinez, MD;  Location: [facility];  Service: Urology;  Laterality: Right;   CYSTOSCOPY W/ URETERAL STENT PLACEMENT Right 06/04/2017   Procedure: CYSTOSCOPY WITHRIGHT RETROGRADE PYELOGRAMRIGHT /URETEREOSCOPY  STENT PLACEMENT;  Surgeon: Crista Elliot, MD;  Location: [facility];  Service: Urology;  Laterality: Right;   ENDOMETRIAL ABLATION     ESOPHAGOGASTRODUODENOSCOPY (EGD) WITH PROPOFOL N/A 08/16/2022   Procedure: ESOPHAGOGASTRODUODENOSCOPY (EGD) WITH PROPOFOL;  Surgeon: Toney Reil, MD;  Location: [facility];  Service: Gastroenterology;  Laterality: N/A;   EYE SURGERY     HEMORRHOID SURGERY  2011   INCONTINENCE SURGERY     INNER EAR SURGERY     X 2   RECTAL PROLAPSE REPAIR     RETINAL DETACHMENT SURGERY Bilateral 2000   TOTAL KNEE ARTHROPLASTY Left 04/29/2019  Procedure: LEFT TOTAL KNEE ARTHROPLASTY;  Surgeon: Kathryne Hitch, MD;  Location: [facility];  Service: Orthopedics;  Laterality: Left;   TUBAL LIGATION     VEIN SURGERY Left 04/2010   Past Medical History:  Diagnosis Date   Allergy    Anal fissure    Anemia    borderline   Anxiety    Arthritis    Blood clot in vein    left leg   Detached retina    BOTH SCLERA BUCKLE BOTH EYES   DVT (deep venous thrombosis) (HCC)    left leg   Endometriosis    Family history of adverse reaction to anesthesia    DAUGHTER POST OP PONV   GERD (gastroesophageal reflux disease)    Glaucoma    RIGHT WORST THAN LEFT   Heart murmur    "caused by anxiety"    History of hemorrhoids    History of kidney stones    Hypertension    IBS (irritable bowel syndrome)    Perforated eardrum, right    SMALL HOLE   PONV (postoperative nausea and vomiting)    Pulmonary embolism (HCC) 6 YRS AGO   Status post arthroscopy of left knee 08/06/2017   LMP 10/11/2010   Opioid Risk Score:   Fall Risk Score:  `1  Depression  screen PHQ 2/9     08/28/2023    1:05 PM 04/30/2023   12:20 PM 04/10/2023   12:54 PM 03/27/2023    2:11 PM 01/29/2023    2:27 PM 10/24/2022    2:28 PM 10/11/2022   10:15 AM  Depression screen PHQ 2/9  Decreased Interest 0 2 2 0 0  2  Down, Depressed, Hopeless 0 1 0 0 0  1  PHQ - 2 Score 0 3 2 0 0  3  Altered sleeping  0 1   1 0  Tired, decreased energy  3 3   1 3   Change in appetite  0 0    2  Feeling bad or failure about yourself   0 0    0  Trouble concentrating  0 0    0  Moving slowly or fidgety/restless  0 0    0  Suicidal thoughts  0 0    0  PHQ-9 Score  6 6    8   Difficult doing work/chores   Very difficult    Very difficult   Review of Systems  Constitutional: Negative.        Weight loss  HENT: Negative.   Eyes: Negative.   Respiratory: Positive for shortness of breath.   Cardiovascular: Negative.   Gastrointestinal: Negative.   Endocrine: Negative.   Genitourinary: Negative.   Musculoskeletal: Positive for arthralgias, back pain and gait problem.       Spasms  Skin: Negative.   Allergic/Immunologic: Negative.   Neurological: Positive for tremors and numbness.  Hematological: Negative.   Psychiatric/Behavioral:       Anxiety  All other systems reviewed and are negative.      Objective:  PRIOR EXAM: Gen: no distress, normal appearing, BMI 26.16, weight 167 lbsm BP 133/81 HEENT: oral mucosa pink and moist, NCAT Cardio: Reg rate Chest: normal effort, normal rate of breathing Abd: soft, non-distended Ext: no edema Psych: pleasant, normal affect Skin: intact Neuro: Alert and oriented x3 MSK: ambulates with cane, decreased range of motion in bilateral knees  Assessment & Plan:  Sharon [name] is a 58 year old woman who presents for follow-up:   1)  Left knee pain post-surgery: -discussed 2nd opinion with Dr. Steward Drone to see if she has reactivity to any of the metals in her implant -discussed that knee pain has worsened since she has been weaning off her  medications to avoid their potential side effects -discussed that even activities such as changing her bed sheets can aggravate her pain -She has a plan for repeat surgical procedure with Dr. Magnus Ivan that will hopefully provide relief.  -She has been icing regularly- recommended three tines per day for 15 minutes. Discussed that this decreases blood flow and can impair healing but help with swelling and pain relief.  -discussed the limitations of her pain on her function -discussed extracorporeal shockwave therapy and how this can help break apart scar tissue -unable to work due the level of her pain -discussed current impact on her life -continue cane for balance -She has been through physical therapy and performs her exercises diligently. --Discussed Sprint PNS system as an option of pain treatment via neuromodulation. Provided following link for patient to learn more about the system: https://www.sprtherapeutics.com/. She does not like the feeling of things under her skin and defers that option.  -She weaned herself all the steroids.  -Provided with a pain relief journal and discussed that it contains foods and lifestyle tips to naturally help to improve pain. Discussed that these lifestyle strategies are also very good for health unlike some medications which can have negative side effects. Discussed that the act of keeping a journal can be therapeutic and helpful to realize patterns what helps to trigger and alleviate pain.   -discussed mechanism of action of low dose naltrexone as an opioid receptor antagonist which stimulates your body's production of its own natural endogenous opioids, helping to decrease pain. Discussed that it can also decrease T cell response and thus be helpful in decreasing inflammation, and symptoms of brain fog, fatigue, anxiety, depression, and allergies. Discussed that this medication needs to be compounded at a compounding pharmacy and can more expensive. Discussed  that I usually start at 1mg  and if this is not providing enough relief then I titrate upward on a monthly basis.   -Discussed Qutenza as an option for neuropathic pain control. Discussed that this is a capsaicin patch, stronger than capsaicin cream. Discussed that it is currently approved for diabetic peripheral neuropathy and post-herpetic neuralgia, but that it has also shown benefit in treating other forms of neuropathy. Provided patient with link to site to learn more about the patch: https://www.clark.biz/. Discussed that the patch would be placed in office and benefits usually last 3 months. Discussed that unintended exposure to capsaicin can cause severe irritation of eyes, mucous membranes, respiratory tract, and skin, but that Qutenza is a local treatment and does not have the systemic side effects of other nerve medications. Discussed that there may be pain, itching, erythema, and decreased sensory function associated with the application of Qutenza. Side effects usually subside within 1 week. A cold pack of analgesic medications can help with these side effects. Blood pressure can also be increased due to pain associated with administration of the patch.  -continue ice     -continue Robaxin as she is nervous about feeling too sleepy with the Tizanidine, and the Robaxin does help.   Continue gabapentin to 600mg  TID PRN.   -Continue neoprene knee sleeve for proprioception  -She has tried Celebrex and Tramadol without benefit. She had respiratory distress with hydrocodone.   -She had worse pain with steroid injection in her left knee.   -  Discussed steroids but she did not like the side effect profile. Discussed the different steroid options. She weaned off these because she developed mouth sores.   -Continue turmeric and cherries.   -Continue Lidocaine patches today.   -She would like rheum panel checked: ordered, reviewed results which were negative, and discussed with  patient.  -Refilled meloxicam.   2) Bunion:  -continue orthotics  3) Irritable bowel syndrome: -She does not eat much sugar or sweets.  -She drinks 1 coke per day.  -She cuts tea out.  -She drinks water, coffee.  -She has been going more regularly and has not needed her Miralax.  -food allergy testing ordered  4) Impaired Concentration: - This persists -She forgets things easily.  -She tries not to multitask when she was with her grandmother.   5) Anxiety: The Gabapentin is helping with this as well, continue -discussed plan to start ecitalopram and that she has cleared this with her ophthalmologist -discussed increasing trazodone -Buspar prescribed, discussed that she has not yet tried this but plans to  6) Vulvodynia: -discussed various treatment options: biofeedback, medications, continue estradiol.  -continue Amitriptyline 10mg  HS, discussed moving dose earlier in the evening.  -discussed that sex is still painful  7) Cervical and lumbar myofascial pain: -try massage -discussed benefits of therapy and trigger point injections -apply lidocaine patch.   8) Bilateral hand pain: likely secondary to arthritis compounded by carpal tunnel syndrome -continue Gabapentin which is helping -continue f/u with surgery as planned.   9) Low back pain, appears to be secondary to face arthritis -continue PT -discussed XR, but she defers due to potential cost -discussed surgery with an orthopedic and she would like to consider this but her insurance would not cover it.  -discussed lack of improvement with injections from Dr. Modesto Charon.  -continue lidocaine patches, discussed that these make her feel weird when she takes them off.  - Ketamine 10%, Baclofen 2%, Cyclobenzaprine 2%, Ketoprofen 10%, Gabapentin 6%, Bupivacaine 1%, Amitryptiline 5%, clonidine 0.2%, continue, discussed this is helpful  Dispense 240 g  Apply 1-2 g to affected area 3-4 times per day  -continue postural  correction -Provided with a pain relief journal and discussed that it contains foods and lifestyle tips to naturally help to improve pain. Discussed that these lifestyle strategies are also very good for health unlike some medications which can have negative side effects. Discussed that the act of keeping a journal can be therapeutic and helpful to realize patterns what helps to trigger and alleviate pain.   -Discussed following foods that may reduce pain: 1) Ginger (especially studied for arthritis)- reduce leukotriene production to decrease inflammation 2) Blueberries- high in phytonutrients that decrease inflammation 3) Salmon- marine omega-3s reduce joint swelling and pain 4) Pumpkin seeds- reduce inflammation 5) dark chocolate- reduces inflammation 6) turmeric- reduces inflammation 7) tart cherries - reduce pain and stiffness 8) extra virgin olive oil - its compound olecanthal helps to block prostaglandins  9) chili peppers- can be eaten or applied topically via capsaicin 10) mint- helpful for headache, muscle aches, joint pain, and itching 11) garlic- reduces inflammation  Link to further information on diet for chronic pain: http://www.bray.com/   10) shoulder pain -used to get shots in the pt, but laying on her back has helped to decrease her shoulder pain  11) Acid reflux -continues famotidine.   11) Soreness in legs -vascular ultrasound ordered of both legs -discussed her history of clots -discussed that a D-dimer test can suggest clot if elevated.  12) Irritable bowel syndrome -discussed her current diet -discussed that she does not eat breakfast due to nausea -discussed her diarrhea and constipation, more diarrheal type at this time.  -discussed her aunt's advise to have a tsp coconut oil daily.  -recommended bioptemizer's magnesium breakthrough -reviewed food allergies with her and made goal for 4  week elimination of foods with the highest responses  13) Insomnia/Shortness of breath with history of clots -discussed that there is no evidence of PE on her CTA -discussed her follow-up with PCP -discussed that her vascular ultrasound of her lower extremities shows a chronic left peroneal CVT  -recommended starting nattokinase to help thin the blood and lower her risk for clots, or to eat natto -discussed the importance of movement in clot prevention -referred for sleep study for shortness of breath  14) Impaired memory: -discussed her memory difficulties. -discussed that memory is improving since stopping her klonopin and decreasing added sugar in her diet -discussed that some studies have linked chronic PPI use to memory impairment -discussed safe way to wean off these medications slowly  15) Hemorroids: Recommended Epsom salt baths  16) prediabetes -advised switching from sweetened black tea to black tea with allulose or honey - recommended avoiding processed foods/added sugars/bread/pasta/rice -recommended drinking a glass of water with either apple cide vinegar or lemon -recommended reading Wheat Belly and Grain Brain -recommended eating fruits, vegetables, yogurt, nuts, olive oil -discussed risks and benefits of metformin -discussed that repeat labs show normalization of HgbA1c! -discussed that she does not eat much anymore -encouraged getting enough protein -discussed that it is hard to maintain a good diet on the holidays -HgbA1c ordered and discussed that it is stable at 5.8 -continue PT for exercise  17) Transaminitis -recommended avoiding ultraprocessed foods -recommended avoiding/minimizing tylenol -recommended supplement NAC 600mg  BID  -recommended drinking dandelion and milk thistle teas -commended on stopping drinking soda products! -recommended avoiding processed creamer in coffee, continue drinking coffee -discussed that repeat CMP shows normalization of  liver enzymes! -CMP ordered  18) Bilateral knee pain: -encouraged use of exercise bike to strengthen quadriceps tendon -continue robaxin, meloxciam -continue PT -discussed tens unit -discussed how the pain limits her ability work  19) Dry mouth: -stop amitriptyline  20) Benzodiazepine withdrawal: -discussed that this should be weaned slowly  31) Restless leg syndrome:  -discussed requip, will prescribe  32) Insomnia: -prescribed trazodone, discussed that this promotes increased deep sleep, discussed that this has helped -recommended tart cherry juice -discussed that she has been on Klonopin for 12 years and is trying to wean off as her PCP is concerned it may be contributing to her brain fog -discussed that Klonopin can contribute to memory decline and commended her for trying to wean off -discussed that she has been having dry mouth and she is not sure if it is from the Requip. She has stopped taking this in case and dry mouth has continued -discussed that robaxin can also cause dry mouth -discussed taking muscle relaxer earlier in the evening so she does not have to wake at night to drink water  33) Chest pain: -discussed current symptoms -prescribed nitroglycerin  34) Elevated BUN -encouraged 6-8 glasses of water per day -discussed that creatinine is normal  35. Diffuse arthritis: -discussed potential benefits of boswellia -continue compounded cream, discussed potentially alternating with blue emu oil -discussed aquatherapy -prescribed PT  36. Left hip pain: -continue PT  37. Impaired balance: -continue PT  38. Fatigue:  -recommended time outdoors   39. Brittle nails: -  recommended collagen and 2 [place] nuts per day -TSH, T3, T4 ordered, discussed that these are normal  13 minutes spent in discussion of her prediabetes, CMP, TSH, T3, T4, her knee pain, possible reactivity to the metals in her implant, offered referral to Dr. Steward Drone for evaluation, discussed  that PT will help her exercise, discussed that she received a lot of chocolates from her daughter and boyfriend and this may have caused her blood sugars to stay elevated

## 2023-09-12 ENCOUNTER — Ambulatory Visit: Payer: Medicare HMO

## 2023-09-12 DIAGNOSIS — R2689 Other abnormalities of gait and mobility: Secondary | ICD-10-CM

## 2023-09-12 DIAGNOSIS — G8929 Other chronic pain: Secondary | ICD-10-CM

## 2023-09-12 DIAGNOSIS — M6281 Muscle weakness (generalized): Secondary | ICD-10-CM | POA: Diagnosis not present

## 2023-09-12 DIAGNOSIS — M25562 Pain in left knee: Secondary | ICD-10-CM | POA: Diagnosis not present

## 2023-09-12 NOTE — Therapy (Signed)
 OUTPATIENT PHYSICAL THERAPY TREATMENT   Patient Name: Sharon [name] MRN: [id number] DOB:02/22/1966, 58 y.o., female Today's Date: 09/12/2023  END OF SESSION:  PT End of Session - 09/12/23 1444     Visit Number 9    Number of Visits 15    Date for PT Re-Evaluation 10/18/23    Authorization Type [employer]    PT Start Time 1445    PT Stop Time 1527    PT Time Calculation (min) 42 min    Activity Tolerance Patient tolerated treatment well    Behavior During Therapy WFL for tasks assessed/performed                    Past Medical History:  Diagnosis Date   Allergy    Anal fissure    Anemia    borderline   Anxiety    Arthritis    Blood clot in vein    left leg   Detached retina    BOTH SCLERA BUCKLE BOTH EYES   DVT (deep venous thrombosis) (HCC)    left leg   Endometriosis    Family history of adverse reaction to anesthesia    DAUGHTER POST OP PONV   GERD (gastroesophageal reflux disease)    Glaucoma    RIGHT WORST THAN LEFT   Heart murmur    "caused by anxiety"    History of hemorrhoids    History of kidney stones    Hypertension    IBS (irritable bowel syndrome)    Perforated eardrum, right    SMALL HOLE   PONV (postoperative nausea and vomiting)    Pulmonary embolism (HCC) 6 YRS AGO   Status post arthroscopy of left knee 08/06/2017   Past Surgical History:  Procedure Laterality Date   ABDOMINAL HYSTERECTOMY     still with both ovaries   BREAST BIOPSY Left 12/19/2022   [place] LT BREAST BX W LOC DEV 1ST LESION IMG BX SPEC US GUIDE 12/19/2022 [facility]   CATARACT EXTRACTION Bilateral 2015   COLONOSCOPY     CYSTOSCOPY W/ URETERAL STENT PLACEMENT Right 05/23/2017   Procedure: CYSTOSCOPY WITH RETROGRADE PYELOGRAM/URETERAL RIGHT STENT PLACEMENT;  Surgeon: Alfredo Martinez, MD;  Location: [facility];  Service: Urology;  Laterality: Right;   CYSTOSCOPY W/ URETERAL STENT PLACEMENT Right 06/04/2017   Procedure: CYSTOSCOPY WITHRIGHT RETROGRADE  PYELOGRAMRIGHT /URETEREOSCOPY  STENT PLACEMENT;  Surgeon: Crista Elliot, MD;  Location: [facility];  Service: Urology;  Laterality: Right;   ENDOMETRIAL ABLATION     ESOPHAGOGASTRODUODENOSCOPY (EGD) WITH PROPOFOL N/A 08/16/2022   Procedure: ESOPHAGOGASTRODUODENOSCOPY (EGD) WITH PROPOFOL;  Surgeon: Toney Reil, MD;  Location: [facility];  Service: Gastroenterology;  Laterality: N/A;   EYE SURGERY     HEMORRHOID SURGERY  2011   INCONTINENCE SURGERY     INNER EAR SURGERY     X 2   RECTAL PROLAPSE REPAIR     RETINAL DETACHMENT SURGERY Bilateral 2000   TOTAL KNEE ARTHROPLASTY Left 04/29/2019   Procedure: LEFT TOTAL KNEE ARTHROPLASTY;  Surgeon: Kathryne Hitch, MD;  Location: [facility];  Service: Orthopedics;  Laterality: Left;   TUBAL LIGATION     VEIN SURGERY Left 04/2010   Patient Active Problem List   Diagnosis Date Noted   Sore throat 04/30/2023   Acute non-recurrent maxillary sinusitis 04/30/2023   Cystitis 04/30/2023   History of DVT (deep vein thrombosis) 04/10/2023   Plaque psoriasis 04/10/2023   Right corneal abrasion 04/10/2023   Dysuria 10/18/2022  Multiple food allergies 10/12/2022   Chronic GERD 08/16/2022   Mild sleep apnea 07/26/2022   Prediabetes 04/13/2022   Palpitations 04/11/2022   Psoriasis of scalp 03/28/2022   Anemia due to vitamin B12 deficiency 03/28/2022   Reflux laryngitis 03/18/2022   Restless leg 03/18/2022   History of endometriosis 03/07/2022   Irritable bowel syndrome with both constipation and diarrhea 03/07/2022   Bronchiolitis 03/07/2022   Chronic fatigue 03/07/2022   Loud snoring 03/07/2022   Reactive airway disease 10/07/2020   Gastroesophageal reflux disease 04/13/2020   Chronic prescription benzodiazepine use 04/13/2020   Abnormal cervical Papanicolaou smear 03/31/2020   Pituitary microadenoma (HCC) 03/31/2020   Endometriosis 03/31/2020   H/O: hysterectomy 03/31/2020   Insomnia 02/03/2020   Pain in  female genitalia on intercourse 01/29/2020   Impingement syndrome of right shoulder 10/28/2018   Chronic pain of left knee 03/27/2018   Chronic right shoulder pain 03/27/2018   Cervicalgia 03/27/2018   Unilateral primary osteoarthritis, left knee 08/06/2017   Tremor 07/10/2017   Trochanteric bursitis, right hip 09/27/2016   GAD (generalized anxiety disorder) 05/31/2014   Rectocele 08/21/2011   Acute cough 10/25/2010   Hypertension 10/13/2010    PCP: Mort Sawyers, FNP  REFERRING PROVIDER: Horton Chin, MD  REFERRING DIAG: M25.561,M25.562,G89.29 (ICD-10-CM) - Chronic pain of both knees   THERAPY DIAG:  Muscle weakness (generalized)  Chronic pain of left knee  Other abnormalities of gait and mobility  Rationale for Evaluation and Treatment: Rehabilitation  ONSET DATE: Chronic, 2019  SUBJECTIVE:   SUBJECTIVE STATEMENT: Pt presents to PT with reports of continued back and L knee pain, although less than last week. Pt has continued to be compliant with HEP.    EVAL: Pt comes into PT using a single point cane with c/c of bilateral knee pain that she rates a 6/10 at best. Pt had a L TKA in 2020 which she did PT for but says she struggles with bending the L knee. Pt largest concern is inflammation and tightness. Describes knee pain as sharp, dull, achy, etc. Dependent on the time. Additionally has concerns with her left quad which she says cannot take any loading or pressure. Patient reports she bruises very easily and experiences a lot of pain. Challenges include transferring from the floor, stairs, squatting, walking, etc. Has worn orthotic inserts and wears ASICS or Brooks. Patient took pain medicine before treatment session which she takes 3 times a day on a regular basis. Patient wants to get her back looked at as well.   PERTINENT HISTORY: TKA 2020 Depression PAIN:  Are you having pain?  Yes: NPRS scale: 8/10  Worst: 10/10 Pain location: Both knees Pain description:  sharp, dull, achy, etc. Aggravating factors: Everything Relieving factors: Pain medication   PRECAUTIONS: None  RED FLAGS: None   WEIGHT BEARING RESTRICTIONS: No  FALLS:  Has patient fallen in last 6 months? No but reports losing balance a lot.   LIVING ENVIRONMENT: Lives with: lives with their partner Lives in: House/apartment Stairs: Yes, External, 3 stairs.  Has following equipment at home: Single point cane and Wheelchair (power)  OCCUPATION: None  PLOF: Independent  PATIENT GOALS: Wants to get back to a regular life. Cannot clean, cook because standing bothers her, play with her grand kids, etc.   OBJECTIVE:  Note: Objective measures were completed at Evaluation unless otherwise noted.  PATIENT SURVEYS:  FOTO 25% ODI: 34/50 - 68% disability  COGNITION: Overall cognitive status: Within functional limits for tasks assessed  SENSATION: WFL Loss of sensation of lateral knee after TKA  EDEMA:  Notable swelling in left knee  MUSCLE LENGTH: Hamstrings: Both tight   POSTURE: rounded shoulders and forward head  PALPATION: Tight hamstrings, edema in knee cap, tight gastroc.  LOWER EXTREMITY ROM:  Active ROM Right eval Left eval Left 08/01/23  Hip flexion     Hip extension     Hip abduction     Hip adduction     Hip internal rotation     Hip external rotation     Knee flexion 135 110 130  Knee extension -1 -1   Ankle dorsiflexion     Ankle plantarflexion     Ankle inversion     Ankle eversion      (Blank rows = not tested)  LOWER EXTREMITY MMT:  MMT Right eval Left eval  Hip flexion 4- 4-  Hip extension    Hip abduction 4- 3+  Hip adduction    Hip internal rotation    Hip external rotation    Knee flexion 4 4  Knee extension 4 4  Ankle dorsiflexion    Ankle plantarflexion    Ankle inversion    Ankle eversion     (Blank rows = not tested)  FUNCTIONAL TESTS:  30 seconds chair stand test: 9  GAIT: Distance walked: 20 ft Assistive  device utilized: None Level of assistance: Complete Independence Comments: Limited DF, limited extension, limited hip flexion, lack of pressure through L LE.                                                                                                                          TREATMENT: [facility] Adult PT Treatment:                                                Therapeutic Exercise:  NuStep L5 x 4 min LE only while taking subjective Supine PPT x 10 - 5" hold Supine PPT with pilates ring 2x10 Supine pilates SLR 2x10 Pilates ring bridge 3x10 Lateral walk YTB x 3 laps at counter Standing hip abd/ext 2x10 YTB TKE with ball 2x10 L - 5" hold Slant board calf stretch 2x30" STS x 10 - 10#  PATIENT EDUCATION:  Education details: continue HEP Person educated: Patient Education method: Explanation, Demonstration, Tactile cues, Verbal cues, and Handouts Education comprehension: verbalized understanding, returned demonstration, and needs further education  HOME EXERCISE PROGRAM: Access Code: [email or web address] URL: [email or web address] Date: 08/15/2023 Prepared by: Edwinna Areola  Exercises - Active Straight Leg Raise with Quad Set (Mirrored)  - 1 x daily - 7 x weekly - 3 sets - 10 reps - Long Sitting Calf Stretch with Strap  - 1 x daily - 7 x weekly - 3 reps - 30 sec hold - Seated Hamstring Stretch  - 1 x daily - 7 x  weekly - 3 reps - 30 sec hold - Supine Hamstring Stretch with Strap  - 1 x daily - 7 x weekly - 3 reps - 30 sdc hold - Supine Posterior Pelvic Tilt  - 1 x daily - 7 x weekly - 2 sets - 10 reps - 3 sec hold - Supine Lower Trunk Rotation  - 1 x daily - 7 x weekly - 10 reps - 5 sec  hold - Supine Bridge  - 1 x daily - 7 x weekly - 2 sets - 10 reps - Supine ITB Stretch with Strap  - 1 x daily - 7 x weekly - 2-3 reps - 30 sec hold  ASSESSMENT:  CLINICAL IMPRESSION: Pt was able to complete prescribed exercises with no adverse effect. Therapy today focused on improving LE  strength and core endurance/motor control. Was able to progress to more standing activity  Does continue to benefit from skilled PT services, will continue to progress as able per POC.   EVAL: Patient is a 58 y.o. female who was seen today for physical therapy evaluation and treatment for bilateral knee pain, more in the left knee than right. Patient exhibits weakness in the LE and tightness, particularly in the hamstring and calf. FOTO score indicates sharp decrease in subjective functional ability below PLOF. Patient would benefit from skilled PT to decrease pain and increase functional ability.   OBJECTIVE IMPAIRMENTS: Abnormal gait, decreased activity tolerance, decreased balance, decreased ROM, and decreased strength.   ACTIVITY LIMITATIONS: carrying, lifting, bending, sitting, standing, squatting, stairs, and transfers  PARTICIPATION LIMITATIONS: meal prep, cleaning, laundry, driving, and community activity  PERSONAL FACTORS: Past/current experiences, Time since onset of injury/illness/exacerbation, and 3+ comorbidities: see PMH above are also affecting patient's functional outcome.   REHAB POTENTIAL: Good  CLINICAL DECISION MAKING: Stable/uncomplicated  EVALUATION COMPLEXITY: Moderate   GOALS: Goals reviewed with patient? Yes  SHORT TERM GOALS: Target date: 08/01/2023  Patient will be I with initial HEP in order to progress with therapy. Baseline: Goal status: MET  2.  Patient will score >/= 5/10 at worse on the NPS by 3rd visit in order to show an improvement in pain allowing for an increase in functional ability.   Baseline: 6/10 best Goal status: ONGOING  LONG TERM GOALS: Target date: 10/18/2023   Patient will be independent with final HEP in order to continue treatment at home for pain tolerance.   Baseline:  Goal status: INITIAL  2.  Patient will score >/= 4/10 at worse to show an improvement in pain allowing for an increase in functional ability.  Baseline: 6/10  best Goal status: INITIAL  3.  Patient will score at least 45% on FOTO to signify clinically meaningful improvement in functional abilities.  Baseline: 25% Goal status: INITIAL  4.  Patient will get all MMT scores to a 4/5 on the L LE. Baseline: see above Goal status: INITIAL  5. Patient will increase 30 Second Sit to Stand rep count to no less than 11 reps for improved balance, strength, and functional mobility Baseline: 9 reps  Goal status: INITIAL   6. Pt will be decrease ODI disability score to no greater than 54% as proxy for functional improvement Baseline: 68% disability    Goal status: INITIAL   PLAN:  PT FREQUENCY: 1x/week  PT DURATION: 8 weeks  PLANNED INTERVENTIONS: 97110-Therapeutic exercises, 97530- Therapeutic activity, [id number]- Neuromuscular re-education, 97535- Self Care, [id number]- Manual therapy, [phone]- Gait training, [phone]- Vasopneumatic device, Patient/Family education, Balance training, Stair training, Joint mobilization,  Joint manipulation, Spinal manipulation, Spinal mobilization, Cryotherapy, and Moist heat  PLAN FOR NEXT SESSION: Continue working on strengthening of the quad, look at lower back next session.    Eloy End PT  09/12/23 3:28 PM

## 2023-09-12 NOTE — Progress Notes (Signed)
 In error

## 2023-09-19 ENCOUNTER — Ambulatory Visit: Payer: Medicare HMO | Attending: Physical Medicine and Rehabilitation

## 2023-09-19 DIAGNOSIS — G8929 Other chronic pain: Secondary | ICD-10-CM | POA: Insufficient documentation

## 2023-09-19 DIAGNOSIS — M25562 Pain in left knee: Secondary | ICD-10-CM | POA: Insufficient documentation

## 2023-09-19 DIAGNOSIS — R2689 Other abnormalities of gait and mobility: Secondary | ICD-10-CM | POA: Diagnosis not present

## 2023-09-19 DIAGNOSIS — M6281 Muscle weakness (generalized): Secondary | ICD-10-CM | POA: Insufficient documentation

## 2023-09-19 DIAGNOSIS — M5459 Other low back pain: Secondary | ICD-10-CM | POA: Diagnosis not present

## 2023-09-19 NOTE — Therapy (Signed)
 OUTPATIENT PHYSICAL THERAPY TREATMENT/PROGRESS NOTE  Progress Note Reporting Period 07/04/2023 to 09/19/2023  See note below for Objective Data and Assessment of Progress/Goals.      Patient Name: Sharon [name] MRN: [id number] DOB:12/03/1965, 58 y.o., female Today's Date: 09/20/2023  END OF SESSION:  PT End of Session - 09/19/23 1444     Visit Number 10    Number of Visits 15    Date for PT Re-Evaluation 10/18/23    Authorization Type [employer]    PT Start Time 1445    PT Stop Time 1525    PT Time Calculation (min) 40 min    Activity Tolerance Patient tolerated treatment well    Behavior During Therapy WFL for tasks assessed/performed                     Past Medical History:  Diagnosis Date   Allergy    Anal fissure    Anemia    borderline   Anxiety    Arthritis    Blood clot in vein    left leg   Detached retina    BOTH SCLERA BUCKLE BOTH EYES   DVT (deep venous thrombosis) (HCC)    left leg   Endometriosis    Family history of adverse reaction to anesthesia    DAUGHTER POST OP PONV   GERD (gastroesophageal reflux disease)    Glaucoma    RIGHT WORST THAN LEFT   Heart murmur    "caused by anxiety"    History of hemorrhoids    History of kidney stones    Hypertension    IBS (irritable bowel syndrome)    Perforated eardrum, right    SMALL HOLE   PONV (postoperative nausea and vomiting)    Pulmonary embolism (HCC) 6 YRS AGO   Status post arthroscopy of left knee 08/06/2017   Past Surgical History:  Procedure Laterality Date   ABDOMINAL HYSTERECTOMY     still with both ovaries   BREAST BIOPSY Left 12/19/2022   [place] LT BREAST BX W LOC DEV 1ST LESION IMG BX SPEC US GUIDE 12/19/2022 [facility]   CATARACT EXTRACTION Bilateral 2015   COLONOSCOPY     CYSTOSCOPY W/ URETERAL STENT PLACEMENT Right 05/23/2017   Procedure: CYSTOSCOPY WITH RETROGRADE PYELOGRAM/URETERAL RIGHT STENT PLACEMENT;  Surgeon: Alfredo Martinez, MD;  Location: [facility];   Service: Urology;  Laterality: Right;   CYSTOSCOPY W/ URETERAL STENT PLACEMENT Right 06/04/2017   Procedure: CYSTOSCOPY WITHRIGHT RETROGRADE PYELOGRAMRIGHT /URETEREOSCOPY  STENT PLACEMENT;  Surgeon: Crista Elliot, MD;  Location: [facility];  Service: Urology;  Laterality: Right;   ENDOMETRIAL ABLATION     ESOPHAGOGASTRODUODENOSCOPY (EGD) WITH PROPOFOL N/A 08/16/2022   Procedure: ESOPHAGOGASTRODUODENOSCOPY (EGD) WITH PROPOFOL;  Surgeon: Toney Reil, MD;  Location: [facility];  Service: Gastroenterology;  Laterality: N/A;   EYE SURGERY     HEMORRHOID SURGERY  2011   INCONTINENCE SURGERY     INNER EAR SURGERY     X 2   RECTAL PROLAPSE REPAIR     RETINAL DETACHMENT SURGERY Bilateral 2000   TOTAL KNEE ARTHROPLASTY Left 04/29/2019   Procedure: LEFT TOTAL KNEE ARTHROPLASTY;  Surgeon: Kathryne Hitch, MD;  Location: [facility];  Service: Orthopedics;  Laterality: Left;   TUBAL LIGATION     VEIN SURGERY Left 04/2010   Patient Active Problem List   Diagnosis Date Noted   Sore throat 04/30/2023   Acute non-recurrent maxillary sinusitis 04/30/2023   Cystitis 04/30/2023  History of DVT (deep vein thrombosis) 04/10/2023   Plaque psoriasis 04/10/2023   Right corneal abrasion 04/10/2023   Dysuria 10/18/2022   Multiple food allergies 10/12/2022   Chronic GERD 08/16/2022   Mild sleep apnea 07/26/2022   Prediabetes 04/13/2022   Palpitations 04/11/2022   Psoriasis of scalp 03/28/2022   Anemia due to vitamin B12 deficiency 03/28/2022   Reflux laryngitis 03/18/2022   Restless leg 03/18/2022   History of endometriosis 03/07/2022   Irritable bowel syndrome with both constipation and diarrhea 03/07/2022   Bronchiolitis 03/07/2022   Chronic fatigue 03/07/2022   Loud snoring 03/07/2022   Reactive airway disease 10/07/2020   Gastroesophageal reflux disease 04/13/2020   Chronic prescription benzodiazepine use 04/13/2020   Abnormal cervical Papanicolaou smear  03/31/2020   Pituitary microadenoma (HCC) 03/31/2020   Endometriosis 03/31/2020   H/O: hysterectomy 03/31/2020   Insomnia 02/03/2020   Pain in female genitalia on intercourse 01/29/2020   Impingement syndrome of right shoulder 10/28/2018   Chronic pain of left knee 03/27/2018   Chronic right shoulder pain 03/27/2018   Cervicalgia 03/27/2018   Unilateral primary osteoarthritis, left knee 08/06/2017   Tremor 07/10/2017   Trochanteric bursitis, right hip 09/27/2016   GAD (generalized anxiety disorder) 05/31/2014   Rectocele 08/21/2011   Acute cough 10/25/2010   Hypertension 10/13/2010    PCP: Mort Sawyers, FNP  REFERRING PROVIDER: Horton Chin, MD  REFERRING DIAG: M25.561,M25.562,G89.29 (ICD-10-CM) - Chronic pain of both knees   THERAPY DIAG:  Muscle weakness (generalized)  Chronic pain of left knee  Rationale for Evaluation and Treatment: Rehabilitation  ONSET DATE: Chronic, 2019  SUBJECTIVE:   SUBJECTIVE STATEMENT: Pt presents to PT with reports of increased hip pain, notes that her back is doing better. Has been compliant with HEP.    EVAL: Pt comes into PT using a single point cane with c/c of bilateral knee pain that she rates a 6/10 at best. Pt had a L TKA in 2020 which she did PT for but says she struggles with bending the L knee. Pt largest concern is inflammation and tightness. Describes knee pain as sharp, dull, achy, etc. Dependent on the time. Additionally has concerns with her left quad which she says cannot take any loading or pressure. Patient reports she bruises very easily and experiences a lot of pain. Challenges include transferring from the floor, stairs, squatting, walking, etc. Has worn orthotic inserts and wears ASICS or Brooks. Patient took pain medicine before treatment session which she takes 3 times a day on a regular basis. Patient wants to get her back looked at as well.   PERTINENT HISTORY: TKA 2020 Depression PAIN:  Are you having pain?   Yes: NPRS scale: 8/10  Worst: 10/10 Pain location: Both knees Pain description: sharp, dull, achy, etc. Aggravating factors: Everything Relieving factors: Pain medication   PRECAUTIONS: None  RED FLAGS: None   WEIGHT BEARING RESTRICTIONS: No  FALLS:  Has patient fallen in last 6 months? No but reports losing balance a lot.   LIVING ENVIRONMENT: Lives with: lives with their partner Lives in: House/apartment Stairs: Yes, External, 3 stairs.  Has following equipment at home: Single point cane and Wheelchair (power)  OCCUPATION: None  PLOF: Independent  PATIENT GOALS: Wants to get back to a regular life. Cannot clean, cook because standing bothers her, play with her grand kids, etc.   OBJECTIVE:  Note: Objective measures were completed at Evaluation unless otherwise noted.  PATIENT SURVEYS:  FOTO 25% ODI: 34/50 - 68% disability  COGNITION: Overall cognitive status: Within functional limits for tasks assessed     SENSATION: WFL Loss of sensation of lateral knee after TKA  EDEMA:  Notable swelling in left knee  MUSCLE LENGTH: Hamstrings: Both tight   POSTURE: rounded shoulders and forward head  PALPATION: Tight hamstrings, edema in knee cap, tight gastroc.  LOWER EXTREMITY ROM:  Active ROM Right eval Left eval Left 08/01/23  Hip flexion     Hip extension     Hip abduction     Hip adduction     Hip internal rotation     Hip external rotation     Knee flexion 135 110 130  Knee extension -1 -1   Ankle dorsiflexion     Ankle plantarflexion     Ankle inversion     Ankle eversion      (Blank rows = not tested)  LOWER EXTREMITY MMT:  MMT Right eval Left eval  Hip flexion 4- 4-  Hip extension    Hip abduction 4- 3+  Hip adduction    Hip internal rotation    Hip external rotation    Knee flexion 4 4  Knee extension 4 4  Ankle dorsiflexion    Ankle plantarflexion    Ankle inversion    Ankle eversion     (Blank rows = not  tested)  FUNCTIONAL TESTS:  30 seconds chair stand test: 9  GAIT: Distance walked: 20 ft Assistive device utilized: None Level of assistance: Complete Independence Comments: Limited DF, limited extension, limited hip flexion, lack of pressure through L LE.                                                                                                                          TREATMENT: [facility] Adult PT Treatment:                                                Therapeutic Exercise:  Supine QS x 10 - 5" hold ITB stretch with strap 2x30" L Supine pilates SLR 2x10 Pilates ring bridge 3x10 Slant board calf stretch 2x30"  TKE with RTB 2x10 L - 5" hold Therapeutic Activity:  NuStep L5 x 4 min LE only for increased activity tolerance STS 2x10 Assessment of tests/measures and outcomes for progress note  PATIENT EDUCATION:  Education details: continue HEP Person educated: Patient Education method: Explanation, Demonstration, Tactile cues, Verbal cues, and Handouts Education comprehension: verbalized understanding, returned demonstration, and needs further education  HOME EXERCISE PROGRAM: Access Code: [email or web address] URL: [email or web address] Date: 08/15/2023 Prepared by: Edwinna Areola  Exercises - Active Straight Leg Raise with Quad Set (Mirrored)  - 1 x daily - 7 x weekly - 3 sets - 10 reps - Long Sitting Calf Stretch with Strap  - 1 x daily - 7 x weekly - 3 reps - 30 sec hold -  Seated Hamstring Stretch  - 1 x daily - 7 x weekly - 3 reps - 30 sec hold - Supine Hamstring Stretch with Strap  - 1 x daily - 7 x weekly - 3 reps - 30 sdc hold - Supine Posterior Pelvic Tilt  - 1 x daily - 7 x weekly - 2 sets - 10 reps - 3 sec hold - Supine Lower Trunk Rotation  - 1 x daily - 7 x weekly - 10 reps - 5 sec  hold - Supine Bridge  - 1 x daily - 7 x weekly - 2 sets - 10 reps - Supine ITB Stretch with Strap  - 1 x daily - 7 x weekly - 2-3 reps - 30 sec hold  ASSESSMENT:  CLINICAL  IMPRESSION: Pt was able to complete all prescribed exercises with no adverse effect. Pt has continued to progress with therapy showing improvement in ODI score today. Exercises today focused on improving strength and general activity tolerance as well as transfer ability. Overall continues to require skilled PT services, will continue per POC.   EVAL: Patient is a 58 y.o. female who was seen today for physical therapy evaluation and treatment for bilateral knee pain, more in the left knee than right. Patient exhibits weakness in the LE and tightness, particularly in the hamstring and calf. FOTO score indicates sharp decrease in subjective functional ability below PLOF. Patient would benefit from skilled PT to decrease pain and increase functional ability.   OBJECTIVE IMPAIRMENTS: Abnormal gait, decreased activity tolerance, decreased balance, decreased ROM, and decreased strength.   ACTIVITY LIMITATIONS: carrying, lifting, bending, sitting, standing, squatting, stairs, and transfers  PARTICIPATION LIMITATIONS: meal prep, cleaning, laundry, driving, and community activity  PERSONAL FACTORS: Past/current experiences, Time since onset of injury/illness/exacerbation, and 3+ comorbidities: see PMH above are also affecting patient's functional outcome.   REHAB POTENTIAL: Good  CLINICAL DECISION MAKING: Stable/uncomplicated  EVALUATION COMPLEXITY: Moderate   GOALS: Goals reviewed with patient? Yes  SHORT TERM GOALS: Target date: 08/01/2023  Patient will be I with initial HEP in order to progress with therapy. Baseline: Goal status: MET  2.  Patient will score >/= 5/10 at worse on the NPS by 3rd visit in order to show an improvement in pain allowing for an increase in functional ability.   Baseline: 6/10 best Goal status: ONGOING  LONG TERM GOALS: Target date: 10/18/2023   Patient will be independent with final HEP in order to continue treatment at home for pain tolerance.   Baseline:   Goal status: IN PROGRESS   2.  Patient will score >/= 4/10 at worse to show an improvement in pain allowing for an increase in functional ability.  Baseline: 6/10 best Goal status: IN PROGRESS  3.  Patient will score at least 45% on FOTO to signify clinically meaningful improvement in functional abilities.  Baseline: 25% Goal status: IN PROGRESS  4.  Patient will get all MMT scores to a 4/5 on the L LE. Baseline: see above Goal status: IN PROGRESS  5. Patient will increase 30 Second Sit to Stand rep count to no less than 11 reps for improved balance, strength, and functional mobility Baseline: 9 reps  Goal status: IN PROGRESS   6. Pt will be decrease ODI disability score to no greater than 54% as proxy for functional improvement Baseline: 68% disability  09/19/2023: 56% disability    Goal status: IN PROGRESS  PLAN:  PT FREQUENCY: 1x/week  PT DURATION: 8 weeks  PLANNED INTERVENTIONS: 97110-Therapeutic exercises, 97530-  Therapeutic activity, [id number]- Neuromuscular re-education, [phone]- Self Care, 13244- Manual therapy, [phone]- Gait training, [phone]- Vasopneumatic device, Patient/Family education, Balance training, Stair training, Joint mobilization, Joint manipulation, Spinal manipulation, Spinal mobilization, Cryotherapy, and Moist heat  PLAN FOR NEXT SESSION: Continue working on strengthening of the quad, look at lower back next session.    Eloy End PT  09/20/23 8:50 AM

## 2023-09-26 ENCOUNTER — Ambulatory Visit: Payer: Medicare HMO

## 2023-09-26 DIAGNOSIS — G8929 Other chronic pain: Secondary | ICD-10-CM | POA: Diagnosis not present

## 2023-09-26 DIAGNOSIS — M5459 Other low back pain: Secondary | ICD-10-CM | POA: Diagnosis not present

## 2023-09-26 DIAGNOSIS — M6281 Muscle weakness (generalized): Secondary | ICD-10-CM | POA: Diagnosis not present

## 2023-09-26 DIAGNOSIS — M25562 Pain in left knee: Secondary | ICD-10-CM | POA: Diagnosis not present

## 2023-09-26 DIAGNOSIS — R2689 Other abnormalities of gait and mobility: Secondary | ICD-10-CM | POA: Diagnosis not present

## 2023-09-26 NOTE — Therapy (Signed)
 OUTPATIENT PHYSICAL THERAPY TREATMENT     Patient Name: Sharon [name] MRN: [id number] DOB:[birth year], 58 y.o., female Today's Date: 09/26/2023  END OF SESSION:  PT End of Session - 09/26/23 1437     Visit Number 11    Number of Visits 15    Date for PT Re-Evaluation 10/18/23    Authorization Type [employer]    PT Start Time 1445    Activity Tolerance Patient tolerated treatment well    Behavior During Therapy WFL for tasks assessed/performed                      Past Medical History:  Diagnosis Date   Allergy    Anal fissure    Anemia    borderline   Anxiety    Arthritis    Blood clot in vein    left leg   Detached retina    BOTH SCLERA BUCKLE BOTH EYES   DVT (deep venous thrombosis) (HCC)    left leg   Endometriosis    Family history of adverse reaction to anesthesia    DAUGHTER POST OP PONV   GERD (gastroesophageal reflux disease)    Glaucoma    RIGHT WORST THAN LEFT   Heart murmur    "caused by anxiety"    History of hemorrhoids    History of kidney stones    Hypertension    IBS (irritable bowel syndrome)    Perforated eardrum, right    SMALL HOLE   PONV (postoperative nausea and vomiting)    Pulmonary embolism (HCC) 6 YRS AGO   Status post arthroscopy of left knee 08/06/2017   Past Surgical History:  Procedure Laterality Date   ABDOMINAL HYSTERECTOMY     still with both ovaries   BREAST BIOPSY Left 12/19/2022   [place] LT BREAST BX W LOC DEV 1ST LESION IMG BX SPEC US GUIDE 12/19/2022 [facility]   CATARACT EXTRACTION Bilateral 2015   COLONOSCOPY     CYSTOSCOPY W/ URETERAL STENT PLACEMENT Right 05/23/2017   Procedure: CYSTOSCOPY WITH RETROGRADE PYELOGRAM/URETERAL RIGHT STENT PLACEMENT;  Surgeon: Alfredo Martinez, MD;  Location: [facility];  Service: Urology;  Laterality: Right;   CYSTOSCOPY W/ URETERAL STENT PLACEMENT Right 06/04/2017   Procedure: CYSTOSCOPY WITHRIGHT RETROGRADE PYELOGRAMRIGHT /URETEREOSCOPY  STENT PLACEMENT;   Surgeon: Crista Elliot, MD;  Location: [facility];  Service: Urology;  Laterality: Right;   ENDOMETRIAL ABLATION     ESOPHAGOGASTRODUODENOSCOPY (EGD) WITH PROPOFOL N/A 08/16/2022   Procedure: ESOPHAGOGASTRODUODENOSCOPY (EGD) WITH PROPOFOL;  Surgeon: Toney Reil, MD;  Location: [facility] ENDOSCOPY;  Service: Gastroenterology;  Laterality: N/A;   EYE SURGERY     HEMORRHOID SURGERY  2011   INCONTINENCE SURGERY     INNER EAR SURGERY     X 2   RECTAL PROLAPSE REPAIR     RETINAL DETACHMENT SURGERY Bilateral 2000   TOTAL KNEE ARTHROPLASTY Left 04/29/2019   Procedure: LEFT TOTAL KNEE ARTHROPLASTY;  Surgeon: Kathryne Hitch, MD;  Location: [facility];  Service: Orthopedics;  Laterality: Left;   TUBAL LIGATION     VEIN SURGERY Left 04/2010   Patient Active Problem List   Diagnosis Date Noted   Sore throat 04/30/2023   Acute non-recurrent maxillary sinusitis 04/30/2023   Cystitis 04/30/2023   History of DVT (deep vein thrombosis) 04/10/2023   Plaque psoriasis 04/10/2023   Right corneal abrasion 04/10/2023   Dysuria 10/18/2022   Multiple food allergies 10/12/2022   Chronic GERD 08/16/2022   Mild  sleep apnea 07/26/2022   Prediabetes 04/13/2022   Palpitations 04/11/2022   Psoriasis of scalp 03/28/2022   Anemia due to vitamin B12 deficiency 03/28/2022   Reflux laryngitis 03/18/2022   Restless leg 03/18/2022   History of endometriosis 03/07/2022   Irritable bowel syndrome with both constipation and diarrhea 03/07/2022   Bronchiolitis 03/07/2022   Chronic fatigue 03/07/2022   Loud snoring 03/07/2022   Reactive airway disease 10/07/2020   Gastroesophageal reflux disease 04/13/2020   Chronic prescription benzodiazepine use 04/13/2020   Abnormal cervical Papanicolaou smear 03/31/2020   Pituitary microadenoma (HCC) 03/31/2020   Endometriosis 03/31/2020   H/O: hysterectomy 03/31/2020   Insomnia 02/03/2020   Pain in female genitalia on intercourse 01/29/2020    Impingement syndrome of right shoulder 10/28/2018   Chronic pain of left knee 03/27/2018   Chronic right shoulder pain 03/27/2018   Cervicalgia 03/27/2018   Unilateral primary osteoarthritis, left knee 08/06/2017   Tremor 07/10/2017   Trochanteric bursitis, right hip 09/27/2016   GAD (generalized anxiety disorder) 05/31/2014   Rectocele 08/21/2011   Acute cough 10/25/2010   Hypertension 10/13/2010    PCP: Mort Sawyers, FNP  REFERRING PROVIDER: Horton Chin, MD  REFERRING DIAG: M25.561,M25.562,G89.29 (ICD-10-CM) - Chronic pain of both knees   THERAPY DIAG:  Muscle weakness (generalized)  Chronic pain of left knee  Rationale for Evaluation and Treatment: Rehabilitation  ONSET DATE: Chronic, 2019  SUBJECTIVE:   SUBJECTIVE STATEMENT: Pt presents to PT with reports of knee and LBP. Has continued HEP compliance.   EVAL: Pt comes into PT using a single point cane with c/c of bilateral knee pain that she rates a 6/10 at best. Pt had a L TKA in 2020 which she did PT for but says she struggles with bending the L knee. Pt largest concern is inflammation and tightness. Describes knee pain as sharp, dull, achy, etc. Dependent on the time. Additionally has concerns with her left quad which she says cannot take any loading or pressure. Patient reports she bruises very easily and experiences a lot of pain. Challenges include transferring from the floor, stairs, squatting, walking, etc. Has worn orthotic inserts and wears ASICS or Brooks. Patient took pain medicine before treatment session which she takes 3 times a day on a regular basis. Patient wants to get her back looked at as well.   PERTINENT HISTORY: TKA 2020 Depression PAIN:  Are you having pain?  Yes: NPRS scale: 8/10  Worst: 10/10 Pain location: Both knees Pain description: sharp, dull, achy, etc. Aggravating factors: Everything Relieving factors: Pain medication   PRECAUTIONS: None  RED FLAGS: None   WEIGHT  BEARING RESTRICTIONS: No  FALLS:  Has patient fallen in last 6 months? No but reports losing balance a lot.   LIVING ENVIRONMENT: Lives with: lives with their partner Lives in: House/apartment Stairs: Yes, External, 3 stairs.  Has following equipment at home: Single point cane and Wheelchair (power)  OCCUPATION: None  PLOF: Independent  PATIENT GOALS: Wants to get back to a regular life. Cannot clean, cook because standing bothers her, play with her grand kids, etc.   OBJECTIVE:  Note: Objective measures were completed at Evaluation unless otherwise noted.  PATIENT SURVEYS:  FOTO 25% ODI: 34/50 - 68% disability  COGNITION: Overall cognitive status: Within functional limits for tasks assessed     SENSATION: WFL Loss of sensation of lateral knee after TKA  EDEMA:  Notable swelling in left knee  MUSCLE LENGTH: Hamstrings: Both tight   POSTURE: rounded shoulders and forward  head  PALPATION: Tight hamstrings, edema in knee cap, tight gastroc.  LOWER EXTREMITY ROM:  Active ROM Right eval Left eval Left 08/01/23  Hip flexion     Hip extension     Hip abduction     Hip adduction     Hip internal rotation     Hip external rotation     Knee flexion 135 110 130  Knee extension -1 -1   Ankle dorsiflexion     Ankle plantarflexion     Ankle inversion     Ankle eversion      (Blank rows = not tested)  LOWER EXTREMITY MMT:  MMT Right eval Left eval  Hip flexion 4- 4-  Hip extension    Hip abduction 4- 3+  Hip adduction    Hip internal rotation    Hip external rotation    Knee flexion 4 4  Knee extension 4 4  Ankle dorsiflexion    Ankle plantarflexion    Ankle inversion    Ankle eversion     (Blank rows = not tested)  FUNCTIONAL TESTS:  30 seconds chair stand test: 9  GAIT: Distance walked: 20 ft Assistive device utilized: None Level of assistance: Complete Independence Comments: Limited DF, limited extension, limited hip flexion, lack of pressure  through L LE.                                                                                                                          TREATMENT: [facility] Adult PT Treatment:                                                Therapeutic Exercise:  Supine QS x 10 - 5" hold Supine pilates SLR 2x10 2# Pilates ring bridge 2x15 S/L hip abd 2x10 2# Slant board calf stretch 2x30"  Lateral walk YTB x 3 laps at counter Standing hip ext 2x10 YTB TKE with ball 2x10 L - 5" hold Therapeutic Activity:  NuStep L5 x 4 min LE only for increased activity tolerance STS 2x10 Wall squat 2x8  PATIENT EDUCATION:  Education details: continue HEP Person educated: Patient Education method: Programmer, multimedia, Demonstration, Actor cues, Verbal cues, and Handouts Education comprehension: verbalized understanding, returned demonstration, and needs further education  HOME EXERCISE PROGRAM: Access Code: [email or web address] URL: [email or web address] Date: 08/15/2023 Prepared by: Edwinna Areola  Exercises - Active Straight Leg Raise with Quad Set (Mirrored)  - 1 x daily - 7 x weekly - 3 sets - 10 reps - Long Sitting Calf Stretch with Strap  - 1 x daily - 7 x weekly - 3 reps - 30 sec hold - Seated Hamstring Stretch  - 1 x daily - 7 x weekly - 3 reps - 30 sec hold - Supine Hamstring Stretch with Strap  - 1 x daily - 7 x weekly - 3 reps -  30 sdc hold - Supine Posterior Pelvic Tilt  - 1 x daily - 7 x weekly - 2 sets - 10 reps - 3 sec hold - Supine Lower Trunk Rotation  - 1 x daily - 7 x weekly - 10 reps - 5 sec  hold - Supine Bridge  - 1 x daily - 7 x weekly - 2 sets - 10 reps - Supine ITB Stretch with Strap  - 1 x daily - 7 x weekly - 2-3 reps - 30 sec hold  ASSESSMENT:  CLINICAL IMPRESSION: Pt was able to complete prescribed exercises with no adverse effect. Therapy today focused on improving LE strength and core endurance/motor control. Does continue to benefit from skilled PT services, will continue to progress as  able per POC.   EVAL: Patient is a 58 y.o. female who was seen today for physical therapy evaluation and treatment for bilateral knee pain, more in the left knee than right. Patient exhibits weakness in the LE and tightness, particularly in the hamstring and calf. FOTO score indicates sharp decrease in subjective functional ability below PLOF. Patient would benefit from skilled PT to decrease pain and increase functional ability.   OBJECTIVE IMPAIRMENTS: Abnormal gait, decreased activity tolerance, decreased balance, decreased ROM, and decreased strength.   ACTIVITY LIMITATIONS: carrying, lifting, bending, sitting, standing, squatting, stairs, and transfers  PARTICIPATION LIMITATIONS: meal prep, cleaning, laundry, driving, and community activity  PERSONAL FACTORS: Past/current experiences, Time since onset of injury/illness/exacerbation, and 3+ comorbidities: see PMH above are also affecting patient's functional outcome.   REHAB POTENTIAL: Good  CLINICAL DECISION MAKING: Stable/uncomplicated  EVALUATION COMPLEXITY: Moderate   GOALS: Goals reviewed with patient? Yes  SHORT TERM GOALS: Target date: 08/01/2023  Patient will be I with initial HEP in order to progress with therapy. Baseline: Goal status: MET  2.  Patient will score >/= 5/10 at worse on the NPS by 3rd visit in order to show an improvement in pain allowing for an increase in functional ability.   Baseline: 6/10 best Goal status: ONGOING  LONG TERM GOALS: Target date: 10/18/2023   Patient will be independent with final HEP in order to continue treatment at home for pain tolerance.   Baseline:  Goal status: IN PROGRESS   2.  Patient will score >/= 4/10 at worse to show an improvement in pain allowing for an increase in functional ability.  Baseline: 6/10 best Goal status: IN PROGRESS  3.  Patient will score at least 45% on FOTO to signify clinically meaningful improvement in functional abilities.  Baseline:  25% Goal status: IN PROGRESS  4.  Patient will get all MMT scores to a 4/5 on the L LE. Baseline: see above Goal status: IN PROGRESS  5. Patient will increase 30 Second Sit to Stand rep count to no less than 11 reps for improved balance, strength, and functional mobility Baseline: 9 reps  Goal status: IN PROGRESS   6. Pt will be decrease ODI disability score to no greater than 54% as proxy for functional improvement Baseline: 68% disability  09/19/2023: 56% disability    Goal status: IN PROGRESS  PLAN:  PT FREQUENCY: 1x/week  PT DURATION: 8 weeks  PLANNED INTERVENTIONS: 97110-Therapeutic exercises, 97530- Therapeutic activity, 97112- Neuromuscular re-education, 97535- Self Care, [id number]- Manual therapy, [phone]- Gait training, [phone]- Vasopneumatic device, Patient/Family education, Balance training, Stair training, Joint mobilization, Joint manipulation, Spinal manipulation, Spinal mobilization, Cryotherapy, and Moist heat  PLAN FOR NEXT SESSION: Continue working on strengthening of the quad, look at lower back  next session.    Eloy End PT  09/26/23 2:38 PM

## 2023-10-02 ENCOUNTER — Other Ambulatory Visit: Payer: Self-pay | Admitting: Physical Medicine and Rehabilitation

## 2023-10-02 MED ORDER — GABAPENTIN 600 MG PO TABS: 1200.0000 mg | ORAL_TABLET | Freq: Three times a day (TID) | ORAL | 3 refills | Status: DC

## 2023-10-03 ENCOUNTER — Other Ambulatory Visit: Payer: Self-pay | Admitting: Physical Medicine and Rehabilitation

## 2023-10-03 ENCOUNTER — Ambulatory Visit

## 2023-10-03 DIAGNOSIS — M6281 Muscle weakness (generalized): Secondary | ICD-10-CM | POA: Diagnosis not present

## 2023-10-03 DIAGNOSIS — R2689 Other abnormalities of gait and mobility: Secondary | ICD-10-CM | POA: Diagnosis not present

## 2023-10-03 DIAGNOSIS — M25562 Pain in left knee: Secondary | ICD-10-CM | POA: Diagnosis not present

## 2023-10-03 DIAGNOSIS — G8929 Other chronic pain: Secondary | ICD-10-CM | POA: Diagnosis not present

## 2023-10-03 DIAGNOSIS — M5459 Other low back pain: Secondary | ICD-10-CM

## 2023-10-03 NOTE — Therapy (Signed)
 OUTPATIENT PHYSICAL THERAPY TREATMENT     Patient Name: Sharon [name] MRN: [id number] DOB:[birth year], 58 y.o., female Today's Date: 10/03/2023  END OF SESSION:  PT End of Session - 10/03/23 1109     Visit Number 12    Number of Visits 15    Date for PT Re-Evaluation 10/18/23    Authorization Type [employer]    PT Start Time 1101    PT Stop Time 1142    PT Time Calculation (min) 41 min    Activity Tolerance Patient tolerated treatment well    Behavior During Therapy WFL for tasks assessed/performed                       Past Medical History:  Diagnosis Date   Allergy    Anal fissure    Anemia    borderline   Anxiety    Arthritis    Blood clot in vein    left leg   Detached retina    BOTH SCLERA BUCKLE BOTH EYES   DVT (deep venous thrombosis) (HCC)    left leg   Endometriosis    Family history of adverse reaction to anesthesia    DAUGHTER POST OP PONV   GERD (gastroesophageal reflux disease)    Glaucoma    RIGHT WORST THAN LEFT   Heart murmur    "caused by anxiety"    History of hemorrhoids    History of kidney stones    Hypertension    IBS (irritable bowel syndrome)    Perforated eardrum, right    SMALL HOLE   PONV (postoperative nausea and vomiting)    Pulmonary embolism (HCC) 6 YRS AGO   Status post arthroscopy of left knee 08/06/2017   Past Surgical History:  Procedure Laterality Date   ABDOMINAL HYSTERECTOMY     still with both ovaries   BREAST BIOPSY Left 12/19/2022   [place] LT BREAST BX W LOC DEV 1ST LESION IMG BX SPEC US GUIDE 12/19/2022 [facility]   CATARACT EXTRACTION Bilateral 2015   COLONOSCOPY     CYSTOSCOPY W/ URETERAL STENT PLACEMENT Right 05/23/2017   Procedure: CYSTOSCOPY WITH RETROGRADE PYELOGRAM/URETERAL RIGHT STENT PLACEMENT;  Surgeon: Alfredo Martinez, MD;  Location: [facility];  Service: Urology;  Laterality: Right;   CYSTOSCOPY W/ URETERAL STENT PLACEMENT Right 06/04/2017   Procedure: CYSTOSCOPY WITHRIGHT  RETROGRADE PYELOGRAMRIGHT /URETEREOSCOPY  STENT PLACEMENT;  Surgeon: Crista Elliot, MD;  Location: [facility];  Service: Urology;  Laterality: Right;   ENDOMETRIAL ABLATION     ESOPHAGOGASTRODUODENOSCOPY (EGD) WITH PROPOFOL N/A 08/16/2022   Procedure: ESOPHAGOGASTRODUODENOSCOPY (EGD) WITH PROPOFOL;  Surgeon: Toney Reil, MD;  Location: [facility];  Service: Gastroenterology;  Laterality: N/A;   EYE SURGERY     HEMORRHOID SURGERY  2011   INCONTINENCE SURGERY     INNER EAR SURGERY     X 2   RECTAL PROLAPSE REPAIR     RETINAL DETACHMENT SURGERY Bilateral 2000   TOTAL KNEE ARTHROPLASTY Left 04/29/2019   Procedure: LEFT TOTAL KNEE ARTHROPLASTY;  Surgeon: Kathryne Hitch, MD;  Location: [facility];  Service: Orthopedics;  Laterality: Left;   TUBAL LIGATION     VEIN SURGERY Left 04/2010   Patient Active Problem List   Diagnosis Date Noted   Sore throat 04/30/2023   Acute non-recurrent maxillary sinusitis 04/30/2023   Cystitis 04/30/2023   History of DVT (deep vein thrombosis) 04/10/2023   Plaque psoriasis 04/10/2023   Right corneal abrasion 04/10/2023  Dysuria 10/18/2022   Multiple food allergies 10/12/2022   Chronic GERD 08/16/2022   Mild sleep apnea 07/26/2022   Prediabetes 04/13/2022   Palpitations 04/11/2022   Psoriasis of scalp 03/28/2022   Anemia due to vitamin B12 deficiency 03/28/2022   Reflux laryngitis 03/18/2022   Restless leg 03/18/2022   History of endometriosis 03/07/2022   Irritable bowel syndrome with both constipation and diarrhea 03/07/2022   Bronchiolitis 03/07/2022   Chronic fatigue 03/07/2022   Loud snoring 03/07/2022   Reactive airway disease 10/07/2020   Gastroesophageal reflux disease 04/13/2020   Chronic prescription benzodiazepine use 04/13/2020   Abnormal cervical Papanicolaou smear 03/31/2020   Pituitary microadenoma (HCC) 03/31/2020   Endometriosis 03/31/2020   H/O: hysterectomy 03/31/2020   Insomnia 02/03/2020    Pain in female genitalia on intercourse 01/29/2020   Impingement syndrome of right shoulder 10/28/2018   Chronic pain of left knee 03/27/2018   Chronic right shoulder pain 03/27/2018   Cervicalgia 03/27/2018   Unilateral primary osteoarthritis, left knee 08/06/2017   Tremor 07/10/2017   Trochanteric bursitis, right hip 09/27/2016   GAD (generalized anxiety disorder) 05/31/2014   Rectocele 08/21/2011   Acute cough 10/25/2010   Hypertension 10/13/2010    PCP: Felicita Horns, FNP  REFERRING PROVIDER: Liam Redhead, MD  REFERRING DIAG: M25.561,M25.562,G89.29 (ICD-10-CM) - Chronic pain of both knees   THERAPY DIAG:  Muscle weakness (generalized)  Chronic pain of left knee  Other abnormalities of gait and mobility  Other low back pain  Rationale for Evaluation and Treatment: Rehabilitation  ONSET DATE: Chronic, 2019  SUBJECTIVE:   SUBJECTIVE STATEMENT: Pt presents to PT with increased soreness after having to defrost her refrigerator yesterday. Has continued HEP compliance with no adverse effect.   EVAL: Pt comes into PT using a single point cane with c/c of bilateral knee pain that she rates a 6/10 at best. Pt had a L TKA in 2020 which she did PT for but says she struggles with bending the L knee. Pt largest concern is inflammation and tightness. Describes knee pain as sharp, dull, achy, etc. Dependent on the time. Additionally has concerns with her left quad which she says cannot take any loading or pressure. Patient reports she bruises very easily and experiences a lot of pain. Challenges include transferring from the floor, stairs, squatting, walking, etc. Has worn orthotic inserts and wears ASICS or Brooks. Patient took pain medicine before treatment session which she takes 3 times a day on a regular basis. Patient wants to get her back looked at as well.   PERTINENT HISTORY: TKA 2020 Depression PAIN:  Are you having pain?  Yes: NPRS scale: 8/10  Worst: 10/10 Pain  location: Both knees Pain description: sharp, dull, achy, etc. Aggravating factors: Everything Relieving factors: Pain medication   PRECAUTIONS: None  RED FLAGS: None   WEIGHT BEARING RESTRICTIONS: No  FALLS:  Has patient fallen in last 6 months? No but reports losing balance a lot.   LIVING ENVIRONMENT: Lives with: lives with their partner Lives in: House/apartment Stairs: Yes, External, 3 stairs.  Has following equipment at home: Single point cane and Wheelchair (power)  OCCUPATION: None  PLOF: Independent  PATIENT GOALS: Wants to get back to a regular life. Cannot clean, cook because standing bothers her, play with her grand kids, etc.   OBJECTIVE:  Note: Objective measures were completed at Evaluation unless otherwise noted.  PATIENT SURVEYS:  FOTO 25% ODI: 34/50 - 68% disability  COGNITION: Overall cognitive status: Within functional limits for tasks  assessed     SENSATION: WFL Loss of sensation of lateral knee after TKA  EDEMA:  Notable swelling in left knee  MUSCLE LENGTH: Hamstrings: Both tight   POSTURE: rounded shoulders and forward head  PALPATION: Tight hamstrings, edema in knee cap, tight gastroc.  LOWER EXTREMITY ROM:  Active ROM Right eval Left eval Left 08/01/23  Hip flexion     Hip extension     Hip abduction     Hip adduction     Hip internal rotation     Hip external rotation     Knee flexion 135 110 130  Knee extension -1 -1   Ankle dorsiflexion     Ankle plantarflexion     Ankle inversion     Ankle eversion      (Blank rows = not tested)  LOWER EXTREMITY MMT:  MMT Right eval Left eval  Hip flexion 4- 4-  Hip extension    Hip abduction 4- 3+  Hip adduction    Hip internal rotation    Hip external rotation    Knee flexion 4 4  Knee extension 4 4  Ankle dorsiflexion    Ankle plantarflexion    Ankle inversion    Ankle eversion     (Blank rows = not tested)  FUNCTIONAL TESTS:  30 seconds chair stand test:  9  GAIT: Distance walked: 20 ft Assistive device utilized: None Level of assistance: Complete Independence Comments: Limited DF, limited extension, limited hip flexion, lack of pressure through L LE.                                                                                                                          TREATMENT: [facility] Adult PT Treatment:                                                Therapeutic Exercise:  Supine PPT x 10 - 5" hold Supine QS x 10 - 5" hold Supine pilates SLR 2x15 2# Pilates ring bridge 2x15 S/L hip abd 2x10 2# TKE with green sports cord x 15 - 5" hold Standing hip abd 2x10 25# Therapeutic Activity:  NuStep L5 x 4 min LE only for increased activity tolerance STS 2x10 - 10# DB Wall squat 2x8  PATIENT EDUCATION:  Education details: continue HEP Person educated: Patient Education method: Explanation, Demonstration, Tactile cues, Verbal cues, and Handouts Education comprehension: verbalized understanding, returned demonstration, and needs further education  HOME EXERCISE PROGRAM: Access Code: [email or web address] URL: [email or web address] Date: 08/15/2023 Prepared by: Edwinna Areola  Exercises - Active Straight Leg Raise with Quad Set (Mirrored)  - 1 x daily - 7 x weekly - 3 sets - 10 reps - Long Sitting Calf Stretch with Strap  - 1 x daily - 7 x weekly - 3 reps - 30 sec hold - Seated Hamstring Stretch  -  1 x daily - 7 x weekly - 3 reps - 30 sec hold - Supine Hamstring Stretch with Strap  - 1 x daily - 7 x weekly - 3 reps - 30 sdc hold - Supine Posterior Pelvic Tilt  - 1 x daily - 7 x weekly - 2 sets - 10 reps - 3 sec hold - Supine Lower Trunk Rotation  - 1 x daily - 7 x weekly - 10 reps - 5 sec  hold - Supine Bridge  - 1 x daily - 7 x weekly - 2 sets - 10 reps - Supine ITB Stretch with Strap  - 1 x daily - 7 x weekly - 2-3 reps - 30 sec hold  ASSESSMENT:  CLINICAL IMPRESSION: Pt was able to complete prescribed exercises with no adverse  effect. Therapy today focused on improving LE strength and core endurance/motor control. Progressing strength well with therapy, able to increase resistance and depth of squat today. Does continue to benefit from skilled PT services, will continue to progress as able per POC.   EVAL: Patient is a 58 y.o. female who was seen today for physical therapy evaluation and treatment for bilateral knee pain, more in the left knee than right. Patient exhibits weakness in the LE and tightness, particularly in the hamstring and calf. FOTO score indicates sharp decrease in subjective functional ability below PLOF. Patient would benefit from skilled PT to decrease pain and increase functional ability.   OBJECTIVE IMPAIRMENTS: Abnormal gait, decreased activity tolerance, decreased balance, decreased ROM, and decreased strength.   ACTIVITY LIMITATIONS: carrying, lifting, bending, sitting, standing, squatting, stairs, and transfers  PARTICIPATION LIMITATIONS: meal prep, cleaning, laundry, driving, and community activity  PERSONAL FACTORS: Past/current experiences, Time since onset of injury/illness/exacerbation, and 3+ comorbidities: see PMH above are also affecting patient's functional outcome.   REHAB POTENTIAL: Good  CLINICAL DECISION MAKING: Stable/uncomplicated  EVALUATION COMPLEXITY: Moderate   GOALS: Goals reviewed with patient? Yes  SHORT TERM GOALS: Target date: 08/01/2023  Patient will be I with initial HEP in order to progress with therapy. Baseline: Goal status: MET  2.  Patient will score >/= 5/10 at worse on the NPS by 3rd visit in order to show an improvement in pain allowing for an increase in functional ability.   Baseline: 6/10 best Goal status: ONGOING  LONG TERM GOALS: Target date: 10/18/2023   Patient will be independent with final HEP in order to continue treatment at home for pain tolerance.   Baseline:  Goal status: IN PROGRESS   2.  Patient will score >/= 4/10 at worse  to show an improvement in pain allowing for an increase in functional ability.  Baseline: 6/10 best Goal status: IN PROGRESS  3.  Patient will score at least 45% on FOTO to signify clinically meaningful improvement in functional abilities.  Baseline: 25% Goal status: IN PROGRESS  4.  Patient will get all MMT scores to a 4/5 on the L LE. Baseline: see above Goal status: IN PROGRESS  5. Patient will increase 30 Second Sit to Stand rep count to no less than 11 reps for improved balance, strength, and functional mobility Baseline: 9 reps  Goal status: IN PROGRESS   6. Pt will be decrease ODI disability score to no greater than 54% as proxy for functional improvement Baseline: 68% disability  09/19/2023: 56% disability    Goal status: IN PROGRESS  PLAN:  PT FREQUENCY: 1x/week  PT DURATION: 8 weeks  PLANNED INTERVENTIONS: 97110-Therapeutic exercises, 97530- Therapeutic activity, [id number]- Neuromuscular  re-education, [phone]- Self Care, 32440- Manual therapy, [phone]- Gait training, [phone]- Vasopneumatic device, Patient/Family education, Balance training, Stair training, Joint mobilization, Joint manipulation, Spinal manipulation, Spinal mobilization, Cryotherapy, and Moist heat  PLAN FOR NEXT SESSION: Continue working on strengthening of the quad, look at lower back next session.    Ivor Mars PT  10/03/23 11:47 AM

## 2023-10-04 ENCOUNTER — Encounter: Attending: Physical Medicine and Rehabilitation | Admitting: Physical Medicine and Rehabilitation

## 2023-10-04 DIAGNOSIS — M25562 Pain in left knee: Secondary | ICD-10-CM | POA: Diagnosis not present

## 2023-10-04 DIAGNOSIS — G8929 Other chronic pain: Secondary | ICD-10-CM

## 2023-10-04 MED ORDER — MELOXICAM 15 MG PO TABS: 15.0000 mg | ORAL_TABLET | Freq: Every day | ORAL | 3 refills | Status: DC

## 2023-10-04 MED ORDER — METHOCARBAMOL 500 MG PO TABS: 500.0000 mg | ORAL_TABLET | Freq: Three times a day (TID) | ORAL | 3 refills | Status: DC | PRN

## 2023-10-04 MED ORDER — GABAPENTIN 100 MG PO CAPS: 200.0000 mg | ORAL_CAPSULE | Freq: Three times a day (TID) | ORAL | 3 refills | Status: DC | PRN

## 2023-10-04 NOTE — Progress Notes (Signed)
 Subjective:    Patient ID: Sharon [name], female    DOB: Dec 30, 1965, 58 y.o.   MRN: [id number]  HPI: An audio/video tele-health visit is felt to be the most appropriate encounter for this patient at this time. This is a follow up tele-visit via phone. The patient is at home. MD is at office. Prior to scheduling this appointment, our staff discussed the limitations of evaluation and management by telemedicine and the availability of in-person appointments. The patient expressed understanding and agreed to proceed.   Sharon [name] is a 58 year old woman who presents for f/u insomnia, bilateral knee pain following left knee replacement in 04/2019, transaminitis, shortness of breath, brain fog, and prediabetes.   1) Bilateral knee pain L>R: -PT is going well -needs refill of gabapentin, meloxicam, robaxin -she is interested in seeing Dr. Steward Drone for a second opinion after her vacation in June -right knee pain is getting worse -taking meloxicam as needed, she took it for three weeks and it caused breathing issues.  -has not tried tens unit before, her boy friend has one -pain was worse after changing her bed sheets -getting a new bike and she is hoping that strengthening her muscles will help -went to church a couple times but it hurt so badly. -right knee pain has been getting worse -Her ROM is still restricted in her left knee.  -ice helps -radiates between hip and left knee Feels like a charley horse -She asks whether the scar tissue will ever heal.  -Cannot sleep without a pillow underneath her legs.  -She has follow-up with Dr. Turner Daniels and Dr. Magnus Ivan -Currently is not planning to pursue surgery.  -She has been off hydrocodone.  -She would be interested in increasing the Gabapentin dose.  -She would like to discuss an option for inflammation. She has tried aspirations but the fluid comes back right away.  -She got a  Steroid injection and experienced worsening range of motion  in her left knee- that was from Dr. Turner Daniels. He also pulled fluid off. It does help with pain, but she does not want another given her worsening range of motion afterward. She does have improved sensation in her knee after the injection! -has been hurting -discussed Qutenza as a treatment option -her current regimen is working for her.  -has been having digestive issues and stopped meloxicam.  -pain has been severe lately -she has decreased her robaxin to daily but this has not improved her dry mouth  2) Anxiety: This has much improved with the Gabapentin. 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  -she has used blue emu oil and diclofenac gel without great benefit -has appointment with physician today.  -she does use her hands a lot.  -she has been dropping things recently.   9) lower back pain -PT is working on lower back as well -has gotten worse -got Xrs and was shown to have cyst on right side and bulging disc on left side and pinched nerve in the middle.  -she feels a constant burning -worst on left side -  feels she is walking crooked -she had an injection by Dr. Modesto Charon at orthopedics but these did not help.  -she know has a bone further up that is bothering her.  -left pain in her boy friend's car so had a hard time getting in today -hurts severely sometimes -pinches when she walks -sitting also hurts -she does not have insurance right now -ice helps -she uses heat for the muscular pain -hurt it while bending and turning -has been worse since she carried her  grandchild -she is unable to vacuum.  -discussed therapy and using lidocaine patch, massage.  -she is ok using gabapentin and her muscle relaxers.  -found lidocaine patch to be a lifesaver while she was at the beach.  -ready to try Qutenza today -has been severe recently -plans to follow-up with her orthopedic surgeon.   10) Constipation -has been needing to take laxatives.   11) shortness of breath -worried abut blot clots -she asks about results of her vascular ultrasound of her lower extremity and what treatment is necessary, how she would know if there are clots elsewhere in her body, and how she may have developed these clots.  -she has being set up for tests  12) impaired memory -has improved since stopping klonopin/reduced added sugars in diet  13) Prediabetes -she asks about HgbA1c test result  14) Transaminitis -she asks whether any of her medications could have caused this'  15) Food allergy -she asks about her food allergy results  16) Insomnia: -asks if there is something else she can try for her insomnia -she has been trying to wean off her Klonopin but this makes it harder for her to fall asleep -the Requip caused dry mouth and she is hesitant about its potential side effects -she has not tried tart cherry juice or melatonin  17) Chest pain -has been radiating to left arm -she follows with a cardiologist Prior history:  Since last visit she has stopped Norco as it caused respiratory distress. We tried Cymbalta 40mg  without benefit or side effects. She has tried voltaren gel, blue emu oil without benefit. She has tried CBD.    She has been trying to improve quadriceps musculature. She discusses some of the exercises she does.   She has been denied in her disability. She has talked to an attorney.  She continues to ambulate with a cane.   She has tried the compounding cream and that does not help much. She has been trying Hemp cream which is helping.  She  is getting foot insoles from the podiatrist. They should be ready in a week or two.   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   Can ride the stationary bicycle and that does not hurt. She sues it every day. She uses leg weights. When   Scar tissue grew back quickly post-surgery. She could not get PT appointments for 3 weeks.   Her surgeon does not want her to break the hardware and he may have to do arthroscopic procedure to flush out the inflammation.   She has tried ice which stopped helping, nabumetone 750 mg BID, Norco 1-2 tablets three times per day PRN (she has 12 left). She uses diclofenac gel   She takes Miralax for constipation. She takes up to 2 times per day. She has a history of IBS.   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   She was doing pool therapy but chemicals in the pool were increased because of COVID.  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 Notes reviewed- recently had a right subacromial injection.   Current pain is 8/10.   12) Hip pain -pain is best in the water -PT is going well -they are working on her steps- she is ok with small steps, but not big ones -she is able to squat a little -muscles are building  13) Stress: -it was stressful for her to help plan her daughter's baby shower  14) Anxiety: -her boy friend is talking about leaving again -she has been having a lot of anxiety about this  15) Fatigue -PT has been going well   Pain Inventory Average Pain 8 Pain Right Now 7 My pain is constant, burning, dull, stabbing, and aching  In the last 24 hours, has pain interfered with the following? General activity 8 Relation with others 7 Enjoyment of life 7 What TIME of day is your pain at its worst? evening,  Sleep (in general) Fair  Pain is worse with:  walking, bending, sitting, inactivity, and standing, some activities Pain improves with: rest, heat/ice, and medication Relief from Meds: 8     Family History  Problem Relation Age of Onset   Emphysema Mother        smoker   Allergies Mother    Asthma Mother    Heart disease Mother        AMI as cause of death   COPD Mother    Asthma Father    Heart disease Father        AMI as cause of death   Diabetes Father    Asthma Brother    Heart disease Brother 61       heart failure   Lung cancer Maternal Grandfather        was a smoker   Cancer Maternal Grandfather        lung   Heart disease Paternal Grandmother        AMI   Heart disease Paternal Grandfather        AMI   Colon cancer Neg Hx    Social History   Socioeconomic History   Marital status: Divorced    Spouse name: Not on file   Number of children: 1   Years of education: Not on file   Highest education level: Not on file  Occupational History   Occupation: IT sales professional: [employer]  Tobacco Use   Smoking status: Never   Smokeless tobacco: Never  Vaping Use   Vaping status: Never Used  Substance and Sexual Activity   Alcohol use: Not Currently    Comment: occ   Drug use: No    Comment: former maryjuana use- quit in 1995   Sexual activity: Yes    Partners: Male    Birth control/protection: Surgical  Other Topics Concern   Not on file  Social History Narrative   Marital status: divorced x 2; dating seriously x 3 years.        Children:  1 daughter (72); no grandchildren      Lives:  With boyfriend.        Employment: works at [employer]      Tobacco: none      Alcohol: rarely; socially      Exercise:  none; has treadmill and elliptical and bikes   Coffee in am / 1/2 of coke in afternoon    High school education      01/29/20   From: the area   Living: alone - but boyfriend living with her Genelle Bal (2020) but knew each other in high school   Work: on disability - working  on appeal      Family: one daughter Leonette Nutting - one grandchild coming in January       Enjoys: relax and watch tv, use to like fishing/boating/rafting, reading, crochet       Exercise: rehab exercises   Diet: not great - limited intake      Safety   Seat belts: Yes    Guns: Yes  and secure   Right handed   One story home   Drinks caffeine   Social Drivers of Corporate investment banker Strain: Low Risk  (03/27/2023)   Overall Financial Resource Strain (CARDIA)    Difficulty of Paying Living Expenses: Not hard at all  Food Insecurity: No Food Insecurity (03/27/2023)   Hunger Vital Sign    Worried About Running Out of Food in the Last Year: Never true    Ran Out of Food in the Last Year: Never true  Transportation Needs: No Transportation Needs (03/27/2023)   PRAPARE - Administrator, Civil Service (Medical): No    Lack of Transportation (Non-Medical): No  Physical Activity: Inactive (03/27/2023)   Exercise Vital Sign    Days of Exercise per Week: 0 days    Minutes of Exercise per Session: 0 min  Stress: No Stress Concern Present (03/27/2023)   [employer] of Occupational Health - Occupational Stress Questionnaire    Feeling of Stress : Not at all  Social Connections: Moderately Isolated (03/27/2023)   Social Connection and Isolation Panel [NHANES]    Frequency of Communication with Friends and Family: More than three times a week    Frequency of Social Gatherings with Friends and Family: More than three times a week    Attends Religious Services: More than 4 times per year    Active Member of [employer] or Organizations: No    Attends Engineer, structural: Never    Marital Status: Divorced   Past Surgical History:  Procedure Laterality Date   ABDOMINAL HYSTERECTOMY     still with both ovaries   BREAST BIOPSY Left 12/19/2022   [place] LT BREAST BX W LOC DEV 1ST LESION IMG BX SPEC US GUIDE 12/19/2022 [facility]   CATARACT EXTRACTION Bilateral 2015    COLONOSCOPY     CYSTOSCOPY W/ URETERAL STENT PLACEMENT Right 05/23/2017   Procedure: CYSTOSCOPY WITH RETROGRADE PYELOGRAM/URETERAL RIGHT STENT PLACEMENT;  Surgeon: Alfredo Martinez, MD;  Location: [facility];  Service: Urology;  Laterality: Right;   CYSTOSCOPY W/ URETERAL STENT PLACEMENT Right 06/04/2017   Procedure: CYSTOSCOPY WITHRIGHT RETROGRADE PYELOGRAMRIGHT /URETEREOSCOPY  STENT PLACEMENT;  Surgeon: Crista Elliot, MD;  Location: [facility];  Service: Urology;  Laterality: Right;   ENDOMETRIAL ABLATION     ESOPHAGOGASTRODUODENOSCOPY (EGD) WITH PROPOFOL N/A 08/16/2022   Procedure: ESOPHAGOGASTRODUODENOSCOPY (EGD) WITH PROPOFOL;  Surgeon: Toney Reil, MD;  Location: [facility] END[facility]COPY;  Service: Gastroenterology;  Laterality: N/A;   EYE SURGERY     HEMORRHOID SURGERY  2011   INCONTINENCE SURGERY     INNER EAR SURGERY     X 2   RECTAL PROLAPSE REPAIR     RETINAL DETACHMENT SURGERY Bilateral  2000   TOTAL KNEE ARTHROPLASTY Left 04/29/2019   Procedure: LEFT TOTAL KNEE ARTHROPLASTY;  Surgeon: Kathryne Hitch, MD;  Location: [facility];  Service: Orthopedics;  Laterality: Left;   TUBAL LIGATION     VEIN SURGERY Left 04/2010   Past Medical History:  Diagnosis Date   Allergy    Anal fissure    Anemia    borderline   Anxiety    Arthritis    Blood clot in vein    left leg   Detached retina    BOTH SCLERA BUCKLE BOTH EYES   DVT (deep venous thrombosis) (HCC)    left leg   Endometriosis    Family history of adverse reaction to anesthesia    DAUGHTER POST OP PONV   GERD (gastroesophageal reflux disease)    Glaucoma    RIGHT WORST THAN LEFT   Heart murmur    "caused by anxiety"    History of hemorrhoids    History of kidney stones    Hypertension    IBS (irritable bowel syndrome)    Perforated eardrum, right    SMALL HOLE   PONV (postoperative nausea and vomiting)    Pulmonary embolism (HCC) 6 YRS AGO   Status post arthroscopy of left knee 08/06/2017    LMP 10/11/2010   Opioid Risk Score:   Fall Risk Score:  `1  Depression screen PHQ 2/9     08/28/2023    1:05 PM 04/30/2023   12:20 PM 04/10/2023   12:54 PM 03/27/2023    2:11 PM 01/29/2023    2:27 PM 10/24/2022    2:28 PM 10/11/2022   10:15 AM  Depression screen PHQ 2/9  Decreased Interest 0 2 2 0 0  2  Down, Depressed, Hopeless 0 1 0 0 0  1  PHQ - 2 Score 0 3 2 0 0  3  Altered sleeping  0 1   1 0  Tired, decreased energy  3 3   1 3   Change in appetite  0 0    2  Feeling bad or failure about yourself   0 0    0  Trouble concentrating  0 0    0  Moving slowly or fidgety/restless  0 0    0  Suicidal thoughts  0 0    0  PHQ-9 Score  6 6    8   Difficult doing work/chores   Very difficult    Very difficult   Review of Systems  Constitutional: Negative.        Weight loss  HENT: Negative.   Eyes: Negative.   Respiratory: Positive for shortness of breath.   Cardiovascular: Negative.   Gastrointestinal: Negative.   Endocrine: Negative.   Genitourinary: Negative.   Musculoskeletal: Positive for arthralgias, back pain and gait problem.       Spasms  Skin: Negative.   Allergic/Immunologic: Negative.   Neurological: Positive for tremors and numbness.  Hematological: Negative.   Psychiatric/Behavioral:       Anxiety  All other systems reviewed and are negative.      Objective:  PRIOR EXAM: Gen: no distress, normal appearing, BMI 26.16, weight 167 lbsm BP 133/81 HEENT: oral mucosa pink and moist, NCAT Cardio: Reg rate Chest: normal effort, normal rate of breathing Abd: soft, non-distended Ext: no edema Psych: pleasant, normal affect Skin: intact Neuro: Alert and oriented x3 MSK: ambulates with cane, decreased range of motion in bilateral knees  Assessment & Plan:  Mrs. Acero is a 58  year old woman who presents for follow-up:   1) Left knee pain post-surgery: -discussed 2nd opinion with Dr. Steward Drone to see if she has reactivity to any of the metals in her  implant -discussed that knee pain has worsened since she has been weaning off her medications to avoid their potential side effects -discussed that even activities such as changing her bed sheets can aggravate her pain -She has a plan for repeat surgical procedure with Dr. Magnus Ivan that will hopefully provide relief.  -She has been icing regularly- recommended three tines per day for 15 minutes. Discussed that this decreases blood flow and can impair healing but help with swelling and pain relief.  -discussed the limitations of her pain on her function -discussed extracorporeal shockwave therapy and how this can help break apart scar tissue -unable to work due the level of her pain -discussed current impact on her life -continue cane for balance -She has been through physical therapy and performs her exercises diligently. --Discussed Sprint PNS system as an option of pain treatment via neuromodulation. Provided following link for patient to learn more about the system: https://www.sprtherapeutics.com/. She does not like the feeling of things under her skin and defers that option.  -She weaned herself all the steroids.  -Provided with a pain relief journal and discussed that it contains foods and lifestyle tips to naturally help to improve pain. Discussed that these lifestyle strategies are also very good for health unlike some medications which can have negative side effects. Discussed that the act of keeping a journal can be therapeutic and helpful to realize patterns what helps to trigger and alleviate pain.   -discussed mechanism of action of low dose naltrexone as an opioid receptor antagonist which stimulates your body's production of its own natural endogenous opioids, helping to decrease pain. Discussed that it can also decrease T cell response and thus be helpful in decreasing inflammation, and symptoms of brain fog, fatigue, anxiety, depression, and allergies. Discussed that this medication  needs to be compounded at a compounding pharmacy and can more expensive. Discussed that I usually start at 1mg  and if this is not providing enough relief then I titrate upward on a monthly basis.   -Discussed Qutenza as an option for neuropathic pain control. Discussed that this is a capsaicin patch, stronger than capsaicin cream. Discussed that it is currently approved for diabetic peripheral neuropathy and post-herpetic neuralgia, but that it has also shown benefit in treating other forms of neuropathy. Provided patient with link to site to learn more about the patch: https://www.clark.biz/. Discussed that the patch would be placed in office and benefits usually last 3 months. Discussed that unintended exposure to capsaicin can cause severe irritation of eyes, mucous membranes, respiratory tract, and skin, but that Qutenza is a local treatment and does not have the systemic side effects of other nerve medications. Discussed that there may be pain, itching, erythema, and decreased sensory function associated with the application of Qutenza. Side effects usually subside within 1 week. A cold pack of analgesic medications can help with these side effects. Blood pressure can also be increased due to pain associated with administration of the patch.  -continue ice     -continue Robaxin as she is nervous about feeling too sleepy with the Tizanidine, and the Robaxin does help.   Continue gabapentin to 600mg  TID PRN.   -Continue neoprene knee sleeve for proprioception  -She has tried Celebrex and Tramadol without benefit. She had respiratory distress with hydrocodone.   -She  had worse pain with steroid injection in her left knee.   -Discussed steroids but she did not like the side effect profile. Discussed the different steroid options. She weaned off these because she developed mouth sores.   -Continue turmeric and cherries.   -Continue Lidocaine patches today.   -She would like rheum panel checked:  ordered, reviewed results which were negative, and discussed with patient.  -Refilled meloxicam.   2) Bunion:  -continue orthotics  3) Irritable bowel syndrome: -She does not eat much sugar or sweets.  -She drinks 1 coke per day.  -She cuts tea out.  -She drinks water, coffee.  -She has been going more regularly and has not needed her Miralax.  -food allergy testing ordered  4) Impaired Concentration: - This persists -She forgets things easily.  -She tries not to multitask when she was with her grandmother.   5) Anxiety: The Gabapentin is helping with this as well, continue -discussed plan to start ecitalopram and that she has cleared this with her ophthalmologist -discussed increasing trazodone -Buspar prescribed, discussed that she has not yet tried this but plans to  6) Vulvodynia: -discussed various treatment options: biofeedback, medications, continue estradiol.  -continue Amitriptyline 10mg  HS, discussed moving dose earlier in the evening.  -discussed that sex is still painful  7) Cervical and lumbar myofascial pain: -try massage -discussed benefits of therapy and trigger point injections -apply lidocaine patch.   8) Bilateral hand pain: likely secondary to arthritis compounded by carpal tunnel syndrome -continue Gabapentin which is helping -continue f/u with surgery as planned.   9) Low back pain, appears to be secondary to face arthritis -continue PT -discussed XR, but she defers due to potential cost -discussed surgery with an orthopedic and she would like to consider this but her insurance would not cover it.  -discussed lack of improvement with injections from Dr. Modesto Charon.  -continue lidocaine patches, discussed that these make her feel weird when she takes them off.  - Ketamine 10%, Baclofen 2%, Cyclobenzaprine 2%, Ketoprofen 10%, Gabapentin 6%, Bupivacaine 1%, Amitryptiline 5%, clonidine 0.2%, continue, discussed this is helpful  Dispense 240 g  Apply 1-2 g  to affected area 3-4 times per day  -continue postural correction -Provided with a pain relief journal and discussed that it contains foods and lifestyle tips to naturally help to improve pain. Discussed that these lifestyle strategies are also very good for health unlike some medications which can have negative side effects. Discussed that the act of keeping a journal can be therapeutic and helpful to realize patterns what helps to trigger and alleviate pain.   -Discussed following foods that may reduce pain: 1) Ginger (especially studied for arthritis)- reduce leukotriene production to decrease inflammation 2) Blueberries- high in phytonutrients that decrease inflammation 3) Salmon- marine omega-3s reduce joint swelling and pain 4) Pumpkin seeds- reduce inflammation 5) dark chocolate- reduces inflammation 6) turmeric- reduces inflammation 7) tart cherries - reduce pain and stiffness 8) extra virgin olive oil - its compound olecanthal helps to block prostaglandins  9) chili peppers- can be eaten or applied topically via capsaicin 10) mint- helpful for headache, muscle aches, joint pain, and itching 11) garlic- reduces inflammation  Link to further information on diet for chronic pain: http://www.bray.com/   10) shoulder pain -used to get shots in the pt, but laying on her back has helped to decrease her shoulder pain  11) Acid reflux -continues famotidine.   11) Soreness in legs -vascular ultrasound ordered of both legs -discussed her history  of clots -discussed that a D-dimer test can suggest clot if elevated.   12) Irritable bowel syndrome -discussed her current diet -discussed that she does not eat breakfast due to nausea -discussed her diarrhea and constipation, more diarrheal type at this time.  -discussed her aunt's advise to have a tsp coconut oil daily.  -recommended bioptemizer's magnesium  breakthrough -reviewed food allergies with her and made goal for 4 week elimination of foods with the highest responses  13) Insomnia/Shortness of breath with history of clots -discussed that there is no evidence of PE on her CTA -discussed her follow-up with PCP -discussed that her vascular ultrasound of her lower extremities shows a chronic left peroneal CVT  -recommended starting nattokinase to help thin the blood and lower her risk for clots, or to eat natto -discussed the importance of movement in clot prevention -referred for sleep study for shortness of breath  14) Impaired memory: -discussed her memory difficulties. -discussed that memory is improving since stopping her klonopin and decreasing added sugar in her diet -discussed that some studies have linked chronic PPI use to memory impairment -discussed safe way to wean off these medications slowly  15) Hemorroids: Recommended Epsom salt baths  16) prediabetes -advised switching from sweetened black tea to black tea with allulose or honey - recommended avoiding processed foods/added sugars/bread/pasta/rice -recommended drinking a glass of water with either apple cide vinegar or lemon -recommended reading Wheat Belly and Grain Brain -recommended eating fruits, vegetables, yogurt, nuts, olive oil -discussed risks and benefits of metformin -discussed that repeat labs show normalization of HgbA1c! -discussed that she does not eat much anymore -encouraged getting enough protein -discussed that it is hard to maintain a good diet on the holidays -HgbA1c ordered and discussed that it is stable at 5.8 -continue PT for exercise  17) Transaminitis -recommended avoiding ultraprocessed foods -recommended avoiding/minimizing tylenol -recommended supplement NAC 600mg  BID  -recommended drinking dandelion and milk thistle teas -commended on stopping drinking soda products! -recommended avoiding processed creamer in coffee, continue  drinking coffee -discussed that repeat CMP shows normalization of liver enzymes! 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 She has stopped taking this in case and dry mouth has continued -discussed that robaxin can also cause dry mouth -discussed taking muscle relaxer earlier in the evening so she does not have to wake at night to drink water  33) Chest pain: -discussed current symptoms -prescribed nitroglycerin  34) Elevated BUN -encouraged 6-8 glasses of water per day -discussed that creatinine is normal  35. Diffuse arthritis: -discussed potential benefits of boswellia -continue compounded cream, discussed potentially alternating with blue emu oil -discussed aquatherapy -prescribed PT  36. Left hip pain: -continue PT  37.  Impaired balance: -continue PT  38. Fatigue:  -recommended time outdoors   39. Brittle nails: -recommended collagen and 2 [place] nuts per day -TSH, T3, T4 ordered, discussed that these are normal  5 minutes spent in discussing potential nickel allergy, continue gabapentin, robaxin, and meloxicam  (refilled), discussed that PT has been helping, discussed that she is taking 600mg  gabapentin when pain is well controlled, and 800mg  when pain is severe

## 2023-10-05 ENCOUNTER — Ambulatory Visit: Payer: Self-pay

## 2023-10-05 NOTE — Telephone Encounter (Signed)
  Chief Complaint: urinary symptoms Symptoms: hot flashes, urinary frequency and urgency, fatigue, lower back pain, nausea, burning with urination Frequency: x 3-4 days Pertinent Negatives: Patient denies gross blood in urine, fever, body aches, chills, vomiting Disposition: [] ED /[x] Urgent Care (no appt availability in office) / [] Appointment(In office/virtual)/ []  [place] [facility]/ [] Home Care/ [x] Refused Recommended Disposition /[]  Mobile Bus/ []  Follow-up with PCP Additional Notes: Patient states she has a bladder sling and at baseline sometimes it affects how frequently she urinates. She states if she lies flat she feels like her bladder is full. Patient states she frequently gets UTI's and has become septic from it once before. Patient aware PCP office is closed until Monday, advised urgent care. Patient refused and states she is too busy this weekend and has her grandkids today. Advised the risk for infection and sepsis when left untreated, patient states she will consider going to urgent care or ED if she gets worse. Patient asked if PCP office has any appointments on Monday, none available.  Copied from CRM [id number]. Topic: Clinical - Red Word Triage >> Oct 05, 2023  8:12 AM Kita Perish H wrote: [facility] that prompted transfer to Nurse Triage: Might have a UTI, burning and weird feeling, feels like she has to go and only a little coming out. Reason for Disposition  Side (flank) or lower back pain present  Answer Assessment - Initial Assessment Questions 1. SEVERITY: "How bad is the pain?"  (e.g., Scale 1-10; mild, moderate, or severe)   - MILD (1-3): complains slightly about urination hurting   - MODERATE (4-7): interferes with normal activities     - SEVERE (8-10): excruciating, unwilling or unable to urinate because of the pain      Mild to moderate.  2. FREQUENCY: "How many times have you had painful urination today?"      Once.  3. PATTERN: "Is pain present every  time you urinate or just sometimes?"      Sometimes.  4. ONSET: "When did the painful urination start?"      X 3-4 days.  5. FEVER: "Do you have a fever?" If Yes, ask: "What is your temperature, how was it measured, and when did it start?"     She states she has not checked but has been having hot flashes.  6. PAST UTI: "Have you had a urine infection before?" If Yes, ask: "When was the last time?" and "What happened that time?"      Yes, she states about 6 months ago. She states before Christmas she came in for a visit and got on an antibiotic.  7. CAUSE: "What do you think is causing the painful urination?"  (e.g., UTI, scratch, Herpes sore)     UTI.  8. OTHER SYMPTOMS: "Do you have any other symptoms?" (e.g., blood in urine, flank pain, genital sores, urgency, vaginal discharge)     Lower back pain.  9. PREGNANCY: "Is there any chance you are pregnant?" "When was your last menstrual period?"     N/A.  Protocols used: Urination Pain - Female-A-AH

## 2023-10-08 ENCOUNTER — Ambulatory Visit: Payer: Self-pay | Admitting: Family

## 2023-10-08 ENCOUNTER — Ambulatory Visit: Payer: Self-pay

## 2023-10-08 NOTE — Telephone Encounter (Signed)
 NOTED Thank you for seeing her Dr. Cherlyn Cornet

## 2023-10-08 NOTE — Telephone Encounter (Signed)
 Copied from CRM [id number]. Topic: Clinical - Red Word Triage >> Oct 08, 2023  9:34 AM Rosamond Comes wrote: Red Word that prompted transfer to Nurse Triage: patient calling in asking for UTI test, burring when urinating >> Oct 08, 2023  9:47 AM Rosamond Comes wrote: Computer froze, calling patient back Left Voice mail. Reason for Disposition . Caller has already spoken with another triager and has no further questions.  Protocols used: No Contact or Duplicate Contact Call-A-AH

## 2023-10-08 NOTE — Telephone Encounter (Signed)
 Copied from CRM [id number]. Topic: Clinical - Red Word Triage >> Oct 08, 2023  9:44 AM [place] A wrote: [facility] that prompted transfer to Nurse Triage: Patient is having pain with possible UTI, burning, frequency   Chief Complaint: UTI Symptoms: burning, frequency, and flank pain Frequency: constant Pertinent Negatives: Patient denies fever Disposition: [] ED /[] Urgent Care (no appt availability in office) / [x] Appointment(In office/virtual)/ []  [place] [facility]/ [] Home Care/ [] Refused Recommended Disposition /[] [place] [facility]/ []  Follow-up with PCP Additional Notes: Pt unable to go to urgent care over the weekend as advised by triage nurse on 4/18. Went ahead and schedule for tomorrow at 3:40pm with Dr. Cherlyn Cornet.  Reason for Disposition  Urinating more frequently than usual (i.e., frequency)  Answer Assessment - Initial Assessment Questions 1. SYMPTOM: "What's the main symptom you're concerned about?" (e.g., frequency, incontinence)     Burning and frequent  2. ONSET: "When did the  burning  start?"     6 days ago  3. PAIN: "Is there any pain?" If Yes, ask: "How bad is it?" (Scale: 1-10; mild, moderate, severe)     Mild to moderate  4. CAUSE: "What do you think is causing the symptoms?"     UTI  5. OTHER SYMPTOMS: "Do you have any other symptoms?" (e.g., blood in urine, fever, flank pain, pain with urination)     Flank pain  6. PREGNANCY: "Is there any chance you are pregnant?" "When was your last menstrual period?"     No  Protocols used: Urinary Symptoms-A-AH

## 2023-10-08 NOTE — Telephone Encounter (Signed)
Agree with precautions given to pt  Agree with nurse assessment in plan.  Thank you for speaking with them.

## 2023-10-08 NOTE — Telephone Encounter (Signed)
 Patient scheduled for tomorrow with Dr. Cherlyn Cornet.

## 2023-10-08 NOTE — Telephone Encounter (Signed)
 Noted.

## 2023-10-08 NOTE — Telephone Encounter (Signed)
 Notably, this is the patient who tried to get an appt with me for Monday 4/21 and could not on Friday. I was already booked with five other providers patients, not leaving any room for my own. My concern is the inability to get my own patients on my schedule for acute concerns, and have had a few complaints on patients not being able to get in with me.   She went to urgent care over the weekend and needed follow up and was scheduled for another provider tomorrow as well because of my lack of availability, again with two other providers pts on my schedule (one of those other provider pts (my noon patient) was scheduled 4/18 for an appt 4/22)

## 2023-10-09 ENCOUNTER — Encounter: Payer: Self-pay | Admitting: Family Medicine

## 2023-10-09 ENCOUNTER — Ambulatory Visit: Admitting: Family Medicine

## 2023-10-09 VITALS — BP 110/62 | HR 76 | Temp 97.8°F | Ht 67.0 in | Wt 167.2 lb

## 2023-10-09 DIAGNOSIS — R3915 Urgency of urination: Secondary | ICD-10-CM

## 2023-10-09 DIAGNOSIS — R300 Dysuria: Secondary | ICD-10-CM | POA: Diagnosis not present

## 2023-10-09 LAB — POC URINALSYSI DIPSTICK (AUTOMATED)
Bilirubin, UA: NEGATIVE
Glucose, UA: NEGATIVE
Ketones, UA: NEGATIVE
Nitrite, UA: NEGATIVE
Protein, UA: POSITIVE — AB
Spec Grav, UA: 1.025 (ref 1.010–1.025)
Urobilinogen, UA: 0.2 U/dL
pH, UA: 5 (ref 5.0–8.0)

## 2023-10-09 MED ORDER — NITROFURANTOIN MONOHYD MACRO 100 MG PO CAPS: 100.0000 mg | ORAL_CAPSULE | Freq: Two times a day (BID) | ORAL | 0 refills | Status: DC

## 2023-10-09 NOTE — Progress Notes (Signed)
 Patient ID: Sharon [name], female    DOB: 09-02-1965, 58 y.o.   MRN: [id number]  This visit was conducted in person.  BP 110/62 (BP Location: Left Arm, Patient Position: Sitting, Cuff Size: Normal)   Pulse 76   Temp 97.8 F (36.6 C) (Temporal)   Ht 5\' 7"  (1.702 m)   Wt 167 lb 4 oz (75.9 kg)   LMP 10/11/2010   SpO2 97%   BMI 26.20 kg/m    CC:  Chief Complaint  Patient presents with   Dysuria   Urinary Urgency    Subjective:   HPI: Sharon [name] is a 58 y.o. female presenting on 10/09/2023 for Dysuria and Urinary Urgency  Dysuria  This is a new problem. The current episode started in the past 7 days. The problem has been waxing and waning. The quality of the pain is described as burning. The pain is at a severity of 6/10. There has been no fever. She is Not sexually active. There is A history of pyelonephritis (with stone). Associated symptoms include frequency, nausea and urgency. Pertinent negatives include no chills, discharge, flank pain, hematuria, hesitancy, possible pregnancy or vomiting. She has tried increased fluids for the symptoms. The treatment provided no relief. Her past medical history is significant for kidney stones and recurrent UTIs. There is no history of catheterization, a single kidney, urinary stasis or a urological procedure.   No recent antibiotics.     Relevant past medical, surgical, family and social history reviewed and updated as indicated. Interim medical history since our last visit reviewed. Allergies and medications reviewed and updated. Outpatient Medications Prior to Visit  Medication Sig Dispense Refill   acyclovir  (ZOVIRAX ) 400 MG tablet Take 1 tablet by mouth daily.     albuterol  (VENTOLIN  HFA) 108 (90 Base) MCG/ACT inhaler INHALE 1-2 PUFFS BY MOUTH EVERY 6 HOURS AS NEEDED FOR WHEEZE OR SHORTNESS OF BREATH 8.5 each 2   benzonatate  (TESSALON ) 100 MG capsule Take 1 capsule (100 mg total) by mouth 3 (three) times daily as needed  for cough. 30 capsule 1   bimatoprost (LUMIGAN) 0.03 % ophthalmic solution Place 1 drop into both eyes at bedtime.     busPIRone  (BUSPAR ) 5 MG tablet TAKE 1 TABLET BY MOUTH THREE TIMES A DAY 270 tablet 2   cefdinir  (OMNICEF ) 300 MG capsule Take 1 capsule (300 mg total) by mouth 2 (two) times daily. 14 capsule 0   Cholecalciferol  (VITAMIN D ) 50 MCG (2000 UT) tablet 1 tablet     clobetasol  (TEMOVATE ) 0.05 % external solution APPLY TO AFFECTED AREA TWICE A DAY 50 mL 0   desonide  (DESOWEN ) 0.05 % cream Apply topically 2 (two) times daily. 30 g 0   EPINEPHrine  0.3 mg/0.3 mL IJ SOAJ injection Inject 0.3 mg into the muscle as needed for anaphylaxis. 1 each 0   estradiol  (ESTRACE ) 0.1 MG/GM vaginal cream Insert pea-sized amount nightly for 2 weeks then every other night     famotidine  (PEPCID ) 40 MG tablet TAKE 1 TABLET BY MOUTH EVERY DAY 90 tablet 1   gabapentin  (NEURONTIN ) 100 MG capsule Take 2 capsules (200 mg total) by mouth 3 (three) times daily as needed. 270 capsule 3   gabapentin  (NEURONTIN ) 600 MG tablet Take 2 tablets (1,200 mg total) by mouth 3 (three) times daily. 270 tablet 3   lidocaine  (LIDODERM ) 5 % Apply 1 patch topically daily.     LUMIGAN 0.01 % SOLN 1 drop at bedtime.     meloxicam  (MOBIC )  15 MG tablet Take 1 tablet (15 mg total) by mouth daily. 90 tablet 3   methocarbamol  (ROBAXIN ) 500 MG tablet Take 1 tablet (500 mg total) by mouth every 8 (eight) hours as needed for muscle spasms. 270 tablet 3   mometasone  (NASONEX ) 50 MCG/ACT nasal spray Place 2 sprays into the nose daily. 1 each 12   nebivolol  (BYSTOLIC ) 5 MG tablet TAKE 1 TABLET BY MOUTH EVERY DAY 90 tablet 4   nitroGLYCERIN  (NITROSTAT ) 0.4 MG SL tablet Place 1 tablet (0.4 mg total) under the tongue every 5 (five) minutes as needed for chest pain. 30 tablet 12   Nitroglycerin  (RECTIV ) 0.4 % OINT Place 0.4 inches rectally 2 (two) times daily as needed (For anal fissures.). 30 g 1   ondansetron  (ZOFRAN -ODT) 4 MG disintegrating  tablet 1 tablet on the tongue and allow to dissolve Orally Once a day for 30 day(s) 20 tablet 0   pantoprazole  (PROTONIX ) 40 MG tablet TAKE 1 TABLET (40 MG TOTAL) BY MOUTH TWICE A DAY BEFORE MEALS 180 tablet 1   polyethylene glycol powder (GLYCOLAX /MIRALAX ) 17 GM/SCOOP powder See admin instructions.     primidone  (MYSOLINE ) 50 MG tablet Take 1 tablet in morning and 3 tablets at bedtime. 360 tablet 3   traZODone  (DESYREL ) 50 MG tablet Take 1 tablet (50 mg total) by mouth at bedtime as needed for sleep. 90 tablet 3   triamcinolone  cream (KENALOG ) 0.1 % Apply to affected areas hands twice daily until improved, then as needed for recurrence. Avoid applying to face, groin, and axilla. Use as directed. Long-term use can cause thinning of the skin. 45 g 3   vitamin B-12 (CYANOCOBALAMIN ) 1000 MCG tablet Take 1 tablet (1,000 mcg total) by mouth daily. 30 tablet 3   Zinc  50 MG TABS 1 tablet Orally Once a day for 30 day(s)     Acetylcysteine (NAC) 600 MG CAPS Take 600 mg by mouth 2 (two) times daily.     primidone  (MYSOLINE ) 50 MG tablet TAKE 1 TABLET IN MORNING AND 3 TABLETS AT BEDTIME 120 tablet 0   No facility-administered medications prior to visit.     Per HPI unless specifically indicated in ROS section below Review of Systems  Constitutional:  Negative for chills, fatigue and fever.  HENT:  Negative for congestion.   Eyes:  Negative for pain.  Respiratory:  Negative for cough and shortness of breath.   Cardiovascular:  Negative for chest pain, palpitations and leg swelling.  Gastrointestinal:  Positive for nausea. Negative for abdominal pain and vomiting.  Genitourinary:  Positive for dysuria, frequency and urgency. Negative for flank pain, hematuria, hesitancy and vaginal bleeding.  Musculoskeletal:  Negative for back pain.  Neurological:  Negative for syncope, light-headedness and headaches.  Psychiatric/Behavioral:  Negative for dysphoric mood.    Objective:  BP 110/62 (BP Location: Left  Arm, Patient Position: Sitting, Cuff Size: Normal)   Pulse 76   Temp 97.8 F (36.6 C) (Temporal)   Ht 5\' 7"  (1.702 m)   Wt 167 lb 4 oz (75.9 kg)   LMP 10/11/2010   SpO2 97%   BMI 26.20 kg/m   Wt Readings from Last 3 Encounters:  10/09/23 167 lb 4 oz (75.9 kg)  08/28/23 167 lb (75.8 kg)  07/02/23 166 lb 3.2 oz (75.4 kg)      Physical Exam Constitutional:      General: She is not in acute distress.    Appearance: Normal appearance. She is well-developed. She is not ill-appearing or toxic-appearing.  HENT:     Head: Normocephalic.     Right Ear: Hearing, tympanic membrane, ear canal and external ear normal. Tympanic membrane is not erythematous, retracted or bulging.     Left Ear: Hearing, tympanic membrane, ear canal and external ear normal. Tympanic membrane is not erythematous, retracted or bulging.     Nose: No mucosal edema or rhinorrhea.     Right Sinus: No maxillary sinus tenderness or frontal sinus tenderness.     Left Sinus: No maxillary sinus tenderness or frontal sinus tenderness.     Mouth/Throat:     Pharynx: Uvula midline.  Eyes:     General: Lids are normal. Lids are everted, no foreign bodies appreciated.     Conjunctiva/sclera: Conjunctivae normal.     Pupils: Pupils are equal, round, and reactive to light.  Neck:     Thyroid : No thyroid  mass or thyromegaly.     Vascular: No carotid bruit.     Trachea: Trachea normal.  Cardiovascular:     Rate and Rhythm: Normal rate and regular rhythm.     Pulses: Normal pulses.     Heart sounds: Normal heart sounds, S1 normal and S2 normal. No murmur heard.    No friction rub. No gallop.  Pulmonary:     Effort: Pulmonary effort is normal. No tachypnea or respiratory distress.     Breath sounds: Normal breath sounds. No decreased breath sounds, wheezing, rhonchi or rales.  Abdominal:     General: Bowel sounds are normal.     Palpations: Abdomen is soft.     Tenderness: There is no abdominal tenderness.  Musculoskeletal:      Cervical back: Normal range of motion and neck supple.  Skin:    General: Skin is warm and dry.     Findings: No rash.  Neurological:     Mental Status: She is alert.  Psychiatric:        Mood and Affect: Mood is not anxious or depressed.        Speech: Speech normal.        Behavior: Behavior normal. Behavior is cooperative.        Thought Content: Thought content normal.        Judgment: Judgment normal.       Results for orders placed or performed in visit on 10/09/23  Urine Culture   Collection Time: 10/09/23  3:47 PM   Specimen: Urine  Result Value Ref Range   MICRO NUMBER: [id number]    SPECIMEN QUALITY: Adequate    Sample Source URINE    STATUS: FINAL    ISOLATE 1: Escherichia coli (A)       Susceptibility   Escherichia coli - URINE CULTURE, REFLEX    AMOX/CLAVULANIC 8 Sensitive     AMPICILLIN >=32 Resistant     AMPICILLIN/SULBACTAM 16 Intermediate     CEFAZOLIN* <=4 Not Reportable      * For infections other than uncomplicated UTI caused by E. coli, K. pneumoniae or P. mirabilis: Cefazolin is resistant if MIC > or = 8 mcg/mL. (Distinguishing susceptible versus intermediate for isolates with MIC < or = 4 mcg/mL requires additional testing.) For uncomplicated UTI caused by E. coli, K. pneumoniae or P. mirabilis: Cefazolin is susceptible if MIC <32 mcg/mL and predicts susceptible to the oral agents cefaclor, cefdinir , cefpodoxime , cefprozil, cefuroxime, cephalexin and loracarbef.     CEFTAZIDIME <=1 Sensitive     CEFEPIME <=1 Sensitive     CEFTRIAXONE <=1 Sensitive     CIPROFLOXACIN  <=0.25  Sensitive     LEVOFLOXACIN  <=0.12 Sensitive     GENTAMICIN  <=1 Sensitive     IMIPENEM <=0.25 Sensitive     NITROFURANTOIN  <=16 Sensitive     PIP/TAZO <=4 Sensitive     TOBRAMYCIN <=1 Sensitive     TRIMETH /SULFA * <=20 Sensitive      * For infections other than uncomplicated UTI caused by E. coli, K. pneumoniae or P. mirabilis: Cefazolin is resistant if MIC > or = 8  mcg/mL. (Distinguishing susceptible versus intermediate for isolates with MIC < or = 4 mcg/mL requires additional testing.) For uncomplicated UTI caused by E. coli, K. pneumoniae or P. mirabilis: Cefazolin is susceptible if MIC <32 mcg/mL and predicts susceptible to the oral agents cefaclor, cefdinir , cefpodoxime , cefprozil, cefuroxime, cephalexin and loracarbef. Legend: S = Susceptible  I = Intermediate R = Resistant  NS = Not susceptible SDD = Susceptible Dose Dependent * = Not Tested  NR = Not Reported **NN = See Therapy Comments   POCT Urinalysis Dipstick (Automated)   Collection Time: 10/09/23  3:49 PM  Result Value Ref Range   Color, UA yellow    Clarity, UA clear    Glucose, UA Negative Negative   Bilirubin, UA negative    Ketones, UA negative    Spec Grav, UA 1.025 1.010 - 1.025   Blood, UA Moderate (2+) (A)    pH, UA 5.0 5.0 - 8.0   Protein, UA Positive (A) Negative   Urobilinogen, UA 0.2 0.2 or 1.0 E.U./dL   Nitrite, UA negative    Leukocytes, UA Moderate (2+) (A) Negative    Assessment and Plan  Dysuria Assessment & Plan: Acute, concern for acute cystitis.  Will send urine for culture.  Will treat empirically with nitrofurantoin  x 5 days.  Patient will push water.  Return and ER precautions provided.  Go to ER if fever on antibiotics or not able to keep down antibiotics.  Orders: -     POCT Urinalysis Dipstick (Automated) -     Urine Culture  Urinary urgency -     POCT Urinalysis Dipstick (Automated) -     Urine Culture  Other orders -     Nitrofurantoin  Monohyd Macro; Take 1 capsule (100 mg total) by mouth 2 (two) times daily.  Dispense: 10 capsule; Refill: 0    No follow-ups on file.   Herby Lolling, MD

## 2023-10-10 ENCOUNTER — Ambulatory Visit

## 2023-10-10 DIAGNOSIS — M25562 Pain in left knee: Secondary | ICD-10-CM | POA: Diagnosis not present

## 2023-10-10 DIAGNOSIS — R2689 Other abnormalities of gait and mobility: Secondary | ICD-10-CM | POA: Diagnosis not present

## 2023-10-10 DIAGNOSIS — G8929 Other chronic pain: Secondary | ICD-10-CM

## 2023-10-10 DIAGNOSIS — M6281 Muscle weakness (generalized): Secondary | ICD-10-CM | POA: Diagnosis not present

## 2023-10-10 DIAGNOSIS — M5459 Other low back pain: Secondary | ICD-10-CM | POA: Diagnosis not present

## 2023-10-10 NOTE — Therapy (Signed)
 OUTPATIENT PHYSICAL THERAPY TREATMENT     Patient Name: Sharon [name] MRN: [id number] DOB:[birth year], 58 y.o., female Today's Date: 10/10/2023  END OF SESSION:  PT End of Session - 10/10/23 1457     Visit Number 13    Number of Visits 15    Date for PT Re-Evaluation 10/18/23    Authorization Type [employer]    PT Start Time 1447    PT Stop Time 1527    PT Time Calculation (min) 40 min    Activity Tolerance Patient tolerated treatment well    Behavior During Therapy WFL for tasks assessed/performed                        Past Medical History:  Diagnosis Date   Allergy     Anal fissure    Anemia    borderline   Anxiety    Arthritis    Blood clot in vein    left leg   Detached retina    BOTH SCLERA BUCKLE BOTH EYES   DVT (deep venous thrombosis) (HCC)    left leg   Endometriosis    Family history of adverse reaction to anesthesia    DAUGHTER POST OP PONV   GERD (gastroesophageal reflux disease)    Glaucoma    RIGHT WORST THAN LEFT   Heart murmur    "caused by anxiety"    History of hemorrhoids    History of kidney stones    Hypertension    IBS (irritable bowel syndrome)    Perforated eardrum, right    SMALL HOLE   PONV (postoperative nausea and vomiting)    Pulmonary embolism (HCC) 6 YRS AGO   Status post arthroscopy of left knee 08/06/2017   Past Surgical History:  Procedure Laterality Date   ABDOMINAL HYSTERECTOMY     still with both ovaries   BREAST BIOPSY Left 12/19/2022   US  LT BREAST BX W LOC DEV 1ST LESION IMG BX SPEC US  GUIDE 12/19/2022 [facility]   CATARACT EXTRACTION Bilateral 2015   COLONOSCOPY     CYSTOSCOPY W/ URETERAL STENT PLACEMENT Right 05/23/2017   Procedure: CYSTOSCOPY WITH RETROGRADE PYELOGRAM/URETERAL RIGHT STENT PLACEMENT;  Surgeon: Erman Hayward, MD;  Location: [facility];  Service: Urology;  Laterality: Right;   CYSTOSCOPY W/ URETERAL STENT PLACEMENT Right 06/04/2017   Procedure: CYSTOSCOPY WITHRIGHT  RETROGRADE PYELOGRAMRIGHT /URETEREOSCOPY  STENT PLACEMENT;  Surgeon: Samson Croak, MD;  Location: [facility];  Service: Urology;  Laterality: Right;   ENDOMETRIAL ABLATION     ESOPHAGOGASTRODUODENOSCOPY (EGD) WITH PROPOFOL  N/A 08/16/2022   Procedure: ESOPHAGOGASTRODUODENOSCOPY (EGD) WITH PROPOFOL ;  Surgeon: Selena Daily, MD;  Location: [facility];  Service: Gastroenterology;  Laterality: N/A;   EYE SURGERY     HEMORRHOID SURGERY  2011   INCONTINENCE SURGERY     INNER EAR SURGERY     X 2   RECTAL PROLAPSE REPAIR     RETINAL DETACHMENT SURGERY Bilateral 2000   TOTAL KNEE ARTHROPLASTY Left 04/29/2019   Procedure: LEFT TOTAL KNEE ARTHROPLASTY;  Surgeon: Arnie Lao, MD;  Location: [facility];  Service: Orthopedics;  Laterality: Left;   TUBAL LIGATION     VEIN SURGERY Left 04/2010   Patient Active Problem List   Diagnosis Date Noted   Sore throat 04/30/2023   Acute non-recurrent maxillary sinusitis 04/30/2023   Cystitis 04/30/2023   History of DVT (deep vein thrombosis) 04/10/2023   Plaque psoriasis 04/10/2023   Right corneal abrasion 04/10/2023  Dysuria 10/18/2022   Multiple food allergies 10/12/2022   Chronic GERD 08/16/2022   Mild sleep apnea 07/26/2022   Prediabetes 04/13/2022   Palpitations 04/11/2022   Psoriasis of scalp 03/28/2022   Anemia due to vitamin B12 deficiency 03/28/2022   Reflux laryngitis 03/18/2022   Restless leg 03/18/2022   History of endometriosis 03/07/2022   Irritable bowel syndrome with both constipation and diarrhea 03/07/2022   Bronchiolitis 03/07/2022   Chronic fatigue 03/07/2022   Loud snoring 03/07/2022   Reactive airway disease 10/07/2020   Gastroesophageal reflux disease 04/13/2020   Chronic prescription benzodiazepine use 04/13/2020   Abnormal cervical Papanicolaou smear 03/31/2020   Pituitary microadenoma (HCC) 03/31/2020   Endometriosis 03/31/2020   H/O: hysterectomy 03/31/2020   Insomnia 02/03/2020    Pain in female genitalia on intercourse 01/29/2020   Impingement syndrome of right shoulder 10/28/2018   Chronic pain of left knee 03/27/2018   Chronic right shoulder pain 03/27/2018   Cervicalgia 03/27/2018   Unilateral primary osteoarthritis, left knee 08/06/2017   Tremor 07/10/2017   Trochanteric bursitis, right hip 09/27/2016   GAD (generalized anxiety disorder) 05/31/2014   Rectocele 08/21/2011   Acute cough 10/25/2010   Hypertension 10/13/2010    PCP: Felicita Horns, FNP  REFERRING PROVIDER: Liam Redhead, MD  REFERRING DIAG: M25.561,M25.562,G89.29 (ICD-10-CM) - Chronic pain of both knees   THERAPY DIAG:  Muscle weakness (generalized)  Chronic pain of left knee  Rationale for Evaluation and Treatment: Rehabilitation  ONSET DATE: Chronic, 2019  SUBJECTIVE:   SUBJECTIVE STATEMENT: Pt arrives to PT with reports of continued stiffness and pain. Has been compliant with HEP.   EVAL: Pt comes into PT using a single point cane with c/c of bilateral knee pain that she rates a 6/10 at best. Pt had a L TKA in 2020 which she did PT for but says she struggles with bending the L knee. Pt largest concern is inflammation and tightness. Describes knee pain as sharp, dull, achy, etc. Dependent on the time. Additionally has concerns with her left quad which she says cannot take any loading or pressure. Patient reports she bruises very easily and experiences a lot of pain. Challenges include transferring from the floor, stairs, squatting, walking, etc. Has worn orthotic inserts and wears ASICS or Brooks. Patient took pain medicine before treatment session which she takes 3 times a day on a regular basis. Patient wants to get her back looked at as well.   PERTINENT HISTORY: TKA 2020 Depression PAIN:  Are you having pain?  Yes: NPRS scale: 8/10  Worst: 10/10 Pain location: Both knees Pain description: sharp, dull, achy, etc. Aggravating factors: Everything Relieving factors: Pain  medication   PRECAUTIONS: None  RED FLAGS: None   WEIGHT BEARING RESTRICTIONS: No  FALLS:  Has patient fallen in last 6 months? No but reports losing balance a lot.   LIVING ENVIRONMENT: Lives with: lives with their partner Lives in: House/apartment Stairs: Yes, External, 3 stairs.  Has following equipment at home: Single point cane and Wheelchair (power)  OCCUPATION: None  PLOF: Independent  PATIENT GOALS: Wants to get back to a regular life. Cannot clean, cook because standing bothers her, play with her grand kids, etc.   OBJECTIVE:  Note: Objective measures were completed at Evaluation unless otherwise noted.  PATIENT SURVEYS:  FOTO 25% ODI: 34/50 - 68% disability  COGNITION: Overall cognitive status: Within functional limits for tasks assessed     SENSATION: WFL Loss of sensation of lateral knee after TKA  EDEMA:  Notable swelling in left knee  MUSCLE LENGTH: Hamstrings: Both tight   POSTURE: rounded shoulders and forward head  PALPATION: Tight hamstrings, edema in knee cap, tight gastroc.  LOWER EXTREMITY ROM:  Active ROM Right eval Left eval Left 08/01/23  Hip flexion     Hip extension     Hip abduction     Hip adduction     Hip internal rotation     Hip external rotation     Knee flexion 135 110 130  Knee extension -1 -1   Ankle dorsiflexion     Ankle plantarflexion     Ankle inversion     Ankle eversion      (Blank rows = not tested)  LOWER EXTREMITY MMT:  MMT Right eval Left eval  Hip flexion 4- 4-  Hip extension    Hip abduction 4- 3+  Hip adduction    Hip internal rotation    Hip external rotation    Knee flexion 4 4  Knee extension 4 4  Ankle dorsiflexion    Ankle plantarflexion    Ankle inversion    Ankle eversion     (Blank rows = not tested)  FUNCTIONAL TESTS:  30 seconds chair stand test: 9  GAIT: Distance walked: 20 ft Assistive device utilized: None Level of assistance: Complete Independence Comments:  Limited DF, limited extension, limited hip flexion, lack of pressure through L LE.                                                                                                                          TREATMENT: [facility] Adult PT Treatment:                                                Therapeutic Exercise:  Supine ITB stretch with strap 2x30" Supine PPT x 10 - 5" hold Supine pilates SLR 2x10 2# Pilates ring bridge 2x15 S/L hip abd 2x10 2# TKE with green sports cord 2x15 - 5" hold Therapeutic Activity:  NuStep L5 x 4 min LE only for increased activity tolerance STS 2x10 - 10# DB Wall squat 2x10  PATIENT EDUCATION:  Education details: continue HEP Person educated: Patient Education method: Explanation, Demonstration, Tactile cues, Verbal cues, and Handouts Education comprehension: verbalized understanding, returned demonstration, and needs further education  HOME EXERCISE PROGRAM: Access Code: [email or web address] URL: [email or web address] Date: 08/15/2023 Prepared by: Loral Roch  Exercises - Active Straight Leg Raise with Quad Set (Mirrored)  - 1 x daily - 7 x weekly - 3 sets - 10 reps - Long Sitting Calf Stretch with Strap  - 1 x daily - 7 x weekly - 3 reps - 30 sec hold - Seated Hamstring Stretch  - 1 x daily - 7 x weekly - 3 reps - 30 sec hold - Supine Hamstring Stretch with Strap  - 1  x daily - 7 x weekly - 3 reps - 30 sdc hold - Supine Posterior Pelvic Tilt  - 1 x daily - 7 x weekly - 2 sets - 10 reps - 3 sec hold - Supine Lower Trunk Rotation  - 1 x daily - 7 x weekly - 10 reps - 5 sec  hold - Supine Bridge  - 1 x daily - 7 x weekly - 2 sets - 10 reps - Supine ITB Stretch with Strap  - 1 x daily - 7 x weekly - 2-3 reps - 30 sec hold  ASSESSMENT:  CLINICAL IMPRESSION: Pt was able to complete prescribed exercises with no adverse effect. Therapy today focused on improving LE strength and core endurance/motor control. Progressing strength well with therapy, able to  increase resistance and depth of squat today. Does continue to benefit from skilled PT services, will continue to progress as able per POC.   EVAL: Patient is a 58 y.o. female who was seen today for physical therapy evaluation and treatment for bilateral knee pain, more in the left knee than right. Patient exhibits weakness in the LE and tightness, particularly in the hamstring and calf. FOTO score indicates sharp decrease in subjective functional ability below PLOF. Patient would benefit from skilled PT to decrease pain and increase functional ability.   OBJECTIVE IMPAIRMENTS: Abnormal gait, decreased activity tolerance, decreased balance, decreased ROM, and decreased strength.   ACTIVITY LIMITATIONS: carrying, lifting, bending, sitting, standing, squatting, stairs, and transfers  PARTICIPATION LIMITATIONS: meal prep, cleaning, laundry, driving, and community activity  PERSONAL FACTORS: Past/current experiences, Time since onset of injury/illness/exacerbation, and 3+ comorbidities: see PMH above are also affecting patient's functional outcome.   REHAB POTENTIAL: Good  CLINICAL DECISION MAKING: Stable/uncomplicated  EVALUATION COMPLEXITY: Moderate   GOALS: Goals reviewed with patient? Yes  SHORT TERM GOALS: Target date: 08/01/2023  Patient will be I with initial HEP in order to progress with therapy. Baseline: Goal status: MET  2.  Patient will score >/= 5/10 at worse on the NPS by 3rd visit in order to show an improvement in pain allowing for an increase in functional ability.   Baseline: 6/10 best Goal status: ONGOING  LONG TERM GOALS: Target date: 10/18/2023   Patient will be independent with final HEP in order to continue treatment at home for pain tolerance.   Baseline:  Goal status: IN PROGRESS   2.  Patient will score >/= 4/10 at worse to show an improvement in pain allowing for an increase in functional ability.  Baseline: 6/10 best Goal status: IN PROGRESS  3.   Patient will score at least 45% on FOTO to signify clinically meaningful improvement in functional abilities.  Baseline: 25% Goal status: IN PROGRESS  4.  Patient will get all MMT scores to a 4/5 on the L LE. Baseline: see above Goal status: IN PROGRESS  5. Patient will increase 30 Second Sit to Stand rep count to no less than 11 reps for improved balance, strength, and functional mobility Baseline: 9 reps  Goal status: IN PROGRESS   6. Pt will be decrease ODI disability score to no greater than 54% as proxy for functional improvement Baseline: 68% disability  09/19/2023: 56% disability    Goal status: IN PROGRESS  PLAN:  PT FREQUENCY: 1x/week  PT DURATION: 8 weeks  PLANNED INTERVENTIONS: 97110-Therapeutic exercises, 97530- Therapeutic activity, 97112- Neuromuscular re-education, 97535- Self Care, 16109- Manual therapy, [phone]- Gait training, [phone]- Vasopneumatic device, Patient/Family education, Balance training, Stair training, Joint mobilization, Joint manipulation,  Spinal manipulation, Spinal mobilization, Cryotherapy, and Moist heat  PLAN FOR NEXT SESSION: Continue working on strengthening of the quad, look at lower back next session.    Ivor Mars PT  10/10/23 3:48 PM

## 2023-10-11 ENCOUNTER — Encounter: Payer: Self-pay | Admitting: Family Medicine

## 2023-10-11 LAB — URINE CULTURE
MICRO NUMBER:: 16359497
SPECIMEN QUALITY:: ADEQUATE

## 2023-10-16 NOTE — Assessment & Plan Note (Signed)
 Acute, concern for acute cystitis.  Will send urine for culture.  Will treat empirically with nitrofurantoin  x 5 days.  Patient will push water.  Return and ER precautions provided.  Go to ER if fever on antibiotics or not able to keep down antibiotics.

## 2023-10-19 MED ORDER — CEPHALEXIN 500 MG PO CAPS: 500.0000 mg | ORAL_CAPSULE | Freq: Three times a day (TID) | ORAL | 0 refills | Status: DC

## 2023-10-24 ENCOUNTER — Ambulatory Visit: Attending: Physical Medicine and Rehabilitation

## 2023-10-24 DIAGNOSIS — M25562 Pain in left knee: Secondary | ICD-10-CM | POA: Insufficient documentation

## 2023-10-24 DIAGNOSIS — G8929 Other chronic pain: Secondary | ICD-10-CM | POA: Insufficient documentation

## 2023-10-24 DIAGNOSIS — M6281 Muscle weakness (generalized): Secondary | ICD-10-CM | POA: Diagnosis not present

## 2023-10-24 DIAGNOSIS — R2689 Other abnormalities of gait and mobility: Secondary | ICD-10-CM | POA: Diagnosis not present

## 2023-10-24 DIAGNOSIS — M5459 Other low back pain: Secondary | ICD-10-CM | POA: Diagnosis not present

## 2023-10-24 NOTE — Therapy (Signed)
 OUTPATIENT PHYSICAL THERAPY TREATMENT     Patient Name: Sharon [name] MRN: [id number] DOB:[birth year], 58 y.o., female Today's Date: 10/24/2023  END OF SESSION:  PT End of Session - 10/24/23 1445     Visit Number 14    Number of Visits 15    Date for PT Re-Evaluation 10/18/23    Authorization Type [employer]    PT Start Time 1445    PT Stop Time 1525    PT Time Calculation (min) 40 min    Activity Tolerance Patient tolerated treatment well    Behavior During Therapy WFL for tasks assessed/performed                         Past Medical History:  Diagnosis Date   Allergy     Anal fissure    Anemia    borderline   Anxiety    Arthritis    Blood clot in vein    left leg   Detached retina    BOTH SCLERA BUCKLE BOTH EYES   DVT (deep venous thrombosis) (HCC)    left leg   Endometriosis    Family history of adverse reaction to anesthesia    DAUGHTER POST OP PONV   GERD (gastroesophageal reflux disease)    Glaucoma    RIGHT WORST THAN LEFT   Heart murmur    "caused by anxiety"    History of hemorrhoids    History of kidney stones    Hypertension    IBS (irritable bowel syndrome)    Perforated eardrum, right    SMALL HOLE   PONV (postoperative nausea and vomiting)    Pulmonary embolism (HCC) 6 YRS AGO   Status post arthroscopy of left knee 08/06/2017   Past Surgical History:  Procedure Laterality Date   ABDOMINAL HYSTERECTOMY     still with both ovaries   BREAST BIOPSY Left 12/19/2022   US  LT BREAST BX W LOC DEV 1ST LESION IMG BX SPEC US  GUIDE 12/19/2022 [facility]   CATARACT EXTRACTION Bilateral 2015   COLONOSCOPY     CYSTOSCOPY W/ URETERAL STENT PLACEMENT Right 05/23/2017   Procedure: CYSTOSCOPY WITH RETROGRADE PYELOGRAM/URETERAL RIGHT STENT PLACEMENT;  Surgeon: Erman Hayward, MD;  Location: [facility];  Service: Urology;  Laterality: Right;   CYSTOSCOPY W/ URETERAL STENT PLACEMENT Right 06/04/2017   Procedure: CYSTOSCOPY WITHRIGHT  RETROGRADE PYELOGRAMRIGHT /URETEREOSCOPY  STENT PLACEMENT;  Surgeon: Samson Croak, MD;  Location: [facility];  Service: Urology;  Laterality: Right;   ENDOMETRIAL ABLATION     ESOPHAGOGASTRODUODENOSCOPY (EGD) WITH PROPOFOL  N/A 08/16/2022   Procedure: ESOPHAGOGASTRODUODENOSCOPY (EGD) WITH PROPOFOL ;  Surgeon: Selena Daily, MD;  Location: [facility];  Service: Gastroenterology;  Laterality: N/A;   EYE SURGERY     HEMORRHOID SURGERY  2011   INCONTINENCE SURGERY     INNER EAR SURGERY     X 2   RECTAL PROLAPSE REPAIR     RETINAL DETACHMENT SURGERY Bilateral 2000   TOTAL KNEE ARTHROPLASTY Left 04/29/2019   Procedure: LEFT TOTAL KNEE ARTHROPLASTY;  Surgeon: Arnie Lao, MD;  Location: [facility];  Service: Orthopedics;  Laterality: Left;   TUBAL LIGATION     VEIN SURGERY Left 04/2010   Patient Active Problem List   Diagnosis Date Noted   Sore throat 04/30/2023   Acute non-recurrent maxillary sinusitis 04/30/2023   Cystitis 04/30/2023   History of DVT (deep vein thrombosis) 04/10/2023   Plaque psoriasis 04/10/2023   Right corneal abrasion  04/10/2023   Dysuria 10/18/2022   Multiple food allergies 10/12/2022   Chronic GERD 08/16/2022   Mild sleep apnea 07/26/2022   Prediabetes 04/13/2022   Palpitations 04/11/2022   Psoriasis of scalp 03/28/2022   Anemia due to vitamin B12 deficiency 03/28/2022   Reflux laryngitis 03/18/2022   Restless leg 03/18/2022   History of endometriosis 03/07/2022   Irritable bowel syndrome with both constipation and diarrhea 03/07/2022   Bronchiolitis 03/07/2022   Chronic fatigue 03/07/2022   Loud snoring 03/07/2022   Reactive airway disease 10/07/2020   Gastroesophageal reflux disease 04/13/2020   Chronic prescription benzodiazepine use 04/13/2020   Abnormal cervical Papanicolaou smear 03/31/2020   Pituitary microadenoma (HCC) 03/31/2020   Endometriosis 03/31/2020   H/O: hysterectomy 03/31/2020   Insomnia 02/03/2020    Pain in female genitalia on intercourse 01/29/2020   Impingement syndrome of right shoulder 10/28/2018   Chronic pain of left knee 03/27/2018   Chronic right shoulder pain 03/27/2018   Cervicalgia 03/27/2018   Unilateral primary osteoarthritis, left knee 08/06/2017   Tremor 07/10/2017   Trochanteric bursitis, right hip 09/27/2016   GAD (generalized anxiety disorder) 05/31/2014   Rectocele 08/21/2011   Acute cough 10/25/2010   Hypertension 10/13/2010    PCP: Felicita Horns, FNP  REFERRING PROVIDER: Liam Redhead, MD  REFERRING DIAG: M25.561,M25.562,G89.29 (ICD-10-CM) - Chronic pain of both knees   THERAPY DIAG:  Muscle weakness (generalized)  Chronic pain of left knee  Other abnormalities of gait and mobility  Rationale for Evaluation and Treatment: Rehabilitation  ONSET DATE: Chronic, 2019  SUBJECTIVE:   SUBJECTIVE STATEMENT: Pt presents to PT with reports of decrease in pain today, but the previous few days she had increased swelling. Has been compliant with HEP.   EVAL: Pt comes into PT using a single point cane with c/c of bilateral knee pain that she rates a 6/10 at best. Pt had a L TKA in 2020 which she did PT for but says she struggles with bending the L knee. Pt largest concern is inflammation and tightness. Describes knee pain as sharp, dull, achy, etc. Dependent on the time. Additionally has concerns with her left quad which she says cannot take any loading or pressure. Patient reports she bruises very easily and experiences a lot of pain. Challenges include transferring from the floor, stairs, squatting, walking, etc. Has worn orthotic inserts and wears ASICS or Brooks. Patient took pain medicine before treatment session which she takes 3 times a day on a regular basis. Patient wants to get her back looked at as well.   PERTINENT HISTORY: TKA 2020 Depression PAIN:  Are you having pain?  Yes: NPRS scale: 8/10  Worst: 10/10 Pain location: Both knees Pain  description: sharp, dull, achy, etc. Aggravating factors: Everything Relieving factors: Pain medication   PRECAUTIONS: None  RED FLAGS: None   WEIGHT BEARING RESTRICTIONS: No  FALLS:  Has patient fallen in last 6 months? No but reports losing balance a lot.   LIVING ENVIRONMENT: Lives with: lives with their partner Lives in: House/apartment Stairs: Yes, External, 3 stairs.  Has following equipment at home: Single point cane and Wheelchair (power)  OCCUPATION: None  PLOF: Independent  PATIENT GOALS: Wants to get back to a regular life. Cannot clean, cook because standing bothers her, play with her grand kids, etc.   OBJECTIVE:  Note: Objective measures were completed at Evaluation unless otherwise noted.  PATIENT SURVEYS:  FOTO 25% ODI: 34/50 - 68% disability  COGNITION: Overall cognitive status: Within functional limits for  tasks assessed     SENSATION: WFL Loss of sensation of lateral knee after TKA  EDEMA:  Notable swelling in left knee  MUSCLE LENGTH: Hamstrings: Both tight   POSTURE: rounded shoulders and forward head  PALPATION: Tight hamstrings, edema in knee cap, tight gastroc.  LOWER EXTREMITY ROM:  Active ROM Right eval Left eval Left 08/01/23  Hip flexion     Hip extension     Hip abduction     Hip adduction     Hip internal rotation     Hip external rotation     Knee flexion 135 110 130  Knee extension -1 -1   Ankle dorsiflexion     Ankle plantarflexion     Ankle inversion     Ankle eversion      (Blank rows = not tested)  LOWER EXTREMITY MMT:  MMT Right eval Left eval  Hip flexion 4- 4-  Hip extension    Hip abduction 4- 3+  Hip adduction    Hip internal rotation    Hip external rotation    Knee flexion 4 4  Knee extension 4 4  Ankle dorsiflexion    Ankle plantarflexion    Ankle inversion    Ankle eversion     (Blank rows = not tested)  FUNCTIONAL TESTS:  30 seconds chair stand test: 9  GAIT: Distance walked: 20  ft Assistive device utilized: None Level of assistance: Complete Independence Comments: Limited DF, limited extension, limited hip flexion, lack of pressure through L LE.                                                                                                                          TREATMENT: [facility] Adult PT Treatment:                                                Therapeutic Exercise:  Supine QS x 10 - 5" hold Supine pilates SLR 2x10 2# Pilates ring bridge 2x15 TKE with green sports cord 2x15 - 5" hold Standing hip abd 2x10 25# Therapeutic Activity:  NuStep L5 x 4 min LE only for increased activity tolerance STS 2x10 - 15# KB KB DL to box [id number] 14# Wall squat 2x10 with physioball   PATIENT EDUCATION:  Education details: continue HEP Person educated: Patient Education method: Explanation, Demonstration, Tactile cues, Verbal cues, and Handouts Education comprehension: verbalized understanding, returned demonstration, and needs further education  HOME EXERCISE PROGRAM: Access Code: [email or web address] URL: [email or web address] Date: 08/15/2023 Prepared by: Loral Roch  Exercises - Active Straight Leg Raise with Quad Set (Mirrored)  - 1 x daily - 7 x weekly - 3 sets - 10 reps - Long Sitting Calf Stretch with Strap  - 1 x daily - 7 x weekly - 3 reps - 30 sec hold - Seated Hamstring Stretch  - 1  x daily - 7 x weekly - 3 reps - 30 sec hold - Supine Hamstring Stretch with Strap  - 1 x daily - 7 x weekly - 3 reps - 30 sdc hold - Supine Posterior Pelvic Tilt  - 1 x daily - 7 x weekly - 2 sets - 10 reps - 3 sec hold - Supine Lower Trunk Rotation  - 1 x daily - 7 x weekly - 10 reps - 5 sec  hold - Supine Bridge  - 1 x daily - 7 x weekly - 2 sets - 10 reps - Supine ITB Stretch with Strap  - 1 x daily - 7 x weekly - 2-3 reps - 30 sec hold  ASSESSMENT:  CLINICAL IMPRESSION: Pt was able to complete prescribed exercises with no adverse effect. Therapy today focused on  improving LE strength and core endurance/motor control. Progressing strength well with therapy, able to increase resistance and depth of squat today. Does continue to benefit from skilled PT services, will continue to progress as able per POC.   EVAL: Patient is a 58 y.o. female who was seen today for physical therapy evaluation and treatment for bilateral knee pain, more in the left knee than right. Patient exhibits weakness in the LE and tightness, particularly in the hamstring and calf. FOTO score indicates sharp decrease in subjective functional ability below PLOF. Patient would benefit from skilled PT to decrease pain and increase functional ability.   OBJECTIVE IMPAIRMENTS: Abnormal gait, decreased activity tolerance, decreased balance, decreased ROM, and decreased strength.   ACTIVITY LIMITATIONS: carrying, lifting, bending, sitting, standing, squatting, stairs, and transfers  PARTICIPATION LIMITATIONS: meal prep, cleaning, laundry, driving, and community activity  PERSONAL FACTORS: Past/current experiences, Time since onset of injury/illness/exacerbation, and 3+ comorbidities: see PMH above are also affecting patient's functional outcome.   REHAB POTENTIAL: Good  CLINICAL DECISION MAKING: Stable/uncomplicated  EVALUATION COMPLEXITY: Moderate   GOALS: Goals reviewed with patient? Yes  SHORT TERM GOALS: Target date: 08/01/2023  Patient will be I with initial HEP in order to progress with therapy. Baseline: Goal status: MET  2.  Patient will score >/= 5/10 at worse on the NPS by 3rd visit in order to show an improvement in pain allowing for an increase in functional ability.   Baseline: 6/10 best Goal status: ONGOING  LONG TERM GOALS: Target date: 10/18/2023   Patient will be independent with final HEP in order to continue treatment at home for pain tolerance.   Baseline:  Goal status: IN PROGRESS   2.  Patient will score >/= 4/10 at worse to show an improvement in pain  allowing for an increase in functional ability.  Baseline: 6/10 best Goal status: IN PROGRESS  3.  Patient will score at least 45% on FOTO to signify clinically meaningful improvement in functional abilities.  Baseline: 25% Goal status: IN PROGRESS  4.  Patient will get all MMT scores to a 4/5 on the L LE. Baseline: see above Goal status: IN PROGRESS  5. Patient will increase 30 Second Sit to Stand rep count to no less than 11 reps for improved balance, strength, and functional mobility Baseline: 9 reps  Goal status: IN PROGRESS   6. Pt will be decrease ODI disability score to no greater than 54% as proxy for functional improvement Baseline: 68% disability  09/19/2023: 56% disability    Goal status: IN PROGRESS   PLAN:  PT FREQUENCY: 1x/week  PT DURATION: 8 weeks  PLANNED INTERVENTIONS: 97110-Therapeutic exercises, 97530- Therapeutic activity, [id number]- Neuromuscular  re-education, [phone]- Self Care, 19147- Manual therapy, [phone]- Gait training, [phone]- Vasopneumatic device, Patient/Family education, Balance training, Stair training, Joint mobilization, Joint manipulation, Spinal manipulation, Spinal mobilization, Cryotherapy, and Moist heat  PLAN FOR NEXT SESSION: Continue working on strengthening of the quad, look at lower back next session.    Ivor Mars PT  10/24/23 3:31 PM

## 2023-10-31 ENCOUNTER — Ambulatory Visit

## 2023-10-31 DIAGNOSIS — G8929 Other chronic pain: Secondary | ICD-10-CM

## 2023-10-31 DIAGNOSIS — M25562 Pain in left knee: Secondary | ICD-10-CM | POA: Diagnosis not present

## 2023-10-31 DIAGNOSIS — M6281 Muscle weakness (generalized): Secondary | ICD-10-CM | POA: Diagnosis not present

## 2023-10-31 DIAGNOSIS — R2689 Other abnormalities of gait and mobility: Secondary | ICD-10-CM | POA: Diagnosis not present

## 2023-10-31 DIAGNOSIS — M5459 Other low back pain: Secondary | ICD-10-CM | POA: Diagnosis not present

## 2023-10-31 NOTE — Therapy (Signed)
 OUTPATIENT PHYSICAL THERAPY TREATMENT/DISCHARGE  PHYSICAL THERAPY DISCHARGE SUMMARY  Visits from Start of Care: 15  Current functional level related to goals / functional outcomes: See goals and objective   Remaining deficits: See goals and objective   Education / Equipment: HEP   Patient agrees to discharge. Patient goals were met. Patient is being discharged due to meeting the stated rehab goals.   Patient Name: Sharon [name] MRN: [id number] DOB:July 04, 1965, 58 y.o., female Today's Date: 10/31/2023  END OF SESSION:  PT End of Session - 10/31/23 1059     Visit Number 15    Number of Visits 15    Date for PT Re-Evaluation 10/18/23    Authorization Type [employer]    PT Start Time 1100    PT Stop Time 1140    PT Time Calculation (min) 40 min    Activity Tolerance Patient tolerated treatment well    Behavior During Therapy WFL for tasks assessed/performed                          Past Medical History:  Diagnosis Date   Allergy     Anal fissure    Anemia    borderline   Anxiety    Arthritis    Blood clot in vein    left leg   Detached retina    BOTH SCLERA BUCKLE BOTH EYES   DVT (deep venous thrombosis) (HCC)    left leg   Endometriosis    Family history of adverse reaction to anesthesia    DAUGHTER POST OP PONV   GERD (gastroesophageal reflux disease)    Glaucoma    RIGHT WORST THAN LEFT   Heart murmur    "caused by anxiety"    History of hemorrhoids    History of kidney stones    Hypertension    IBS (irritable bowel syndrome)    Perforated eardrum, right    SMALL HOLE   PONV (postoperative nausea and vomiting)    Pulmonary embolism (HCC) 6 YRS AGO   Status post arthroscopy of left knee 08/06/2017   Past Surgical History:  Procedure Laterality Date   ABDOMINAL HYSTERECTOMY     still with both ovaries   BREAST BIOPSY Left 12/19/2022   US  LT BREAST BX W LOC DEV 1ST LESION IMG BX SPEC US  GUIDE 12/19/2022 [facility]    CATARACT EXTRACTION Bilateral 2015   COLONOSCOPY     CYSTOSCOPY W/ URETERAL STENT PLACEMENT Right 05/23/2017   Procedure: CYSTOSCOPY WITH RETROGRADE PYELOGRAM/URETERAL RIGHT STENT PLACEMENT;  Surgeon: Erman Hayward, MD;  Location: [facility];  Service: Urology;  Laterality: Right;   CYSTOSCOPY W/ URETERAL STENT PLACEMENT Right 06/04/2017   Procedure: CYSTOSCOPY WITHRIGHT RETROGRADE PYELOGRAMRIGHT /URETEREOSCOPY  STENT PLACEMENT;  Surgeon: Samson Croak, MD;  Location: [facility];  Service: Urology;  Laterality: Right;   ENDOMETRIAL ABLATION     ESOPHAGOGASTRODUODENOSCOPY (EGD) WITH PROPOFOL  N/A 08/16/2022   Procedure: ESOPHAGOGASTRODUODENOSCOPY (EGD) WITH PROPOFOL ;  Surgeon: Selena Daily, MD;  Location: [facility];  Service: Gastroenterology;  Laterality: N/A;   EYE SURGERY     HEMORRHOID SURGERY  2011   INCONTINENCE SURGERY     INNER EAR SURGERY     X 2   RECTAL PROLAPSE REPAIR     RETINAL DETACHMENT SURGERY Bilateral 2000   TOTAL KNEE ARTHROPLASTY Left 04/29/2019   Procedure: LEFT TOTAL KNEE ARTHROPLASTY;  Surgeon: Arnie Lao, MD;  Location: [facility];  Service: Orthopedics;  Laterality: Left;   TUBAL LIGATION     VEIN SURGERY Left 04/2010   Patient Active Problem List   Diagnosis Date Noted   Sore throat 04/30/2023   Acute non-recurrent maxillary sinusitis 04/30/2023   Cystitis 04/30/2023   History of DVT (deep vein thrombosis) 04/10/2023   Plaque psoriasis 04/10/2023   Right corneal abrasion 04/10/2023   Dysuria 10/18/2022   Multiple food allergies 10/12/2022   Chronic GERD 08/16/2022   Mild sleep apnea 07/26/2022   Prediabetes 04/13/2022   Palpitations 04/11/2022   Psoriasis of scalp 03/28/2022   Anemia due to vitamin B12 deficiency 03/28/2022   Reflux laryngitis 03/18/2022   Restless leg 03/18/2022   History of endometriosis 03/07/2022   Irritable bowel syndrome with both constipation and diarrhea 03/07/2022   Bronchiolitis  03/07/2022   Chronic fatigue 03/07/2022   Loud snoring 03/07/2022   Reactive airway disease 10/07/2020   Gastroesophageal reflux disease 04/13/2020   Chronic prescription benzodiazepine use 04/13/2020   Abnormal cervical Papanicolaou smear 03/31/2020   Pituitary microadenoma (HCC) 03/31/2020   Endometriosis 03/31/2020   H/O: hysterectomy 03/31/2020   Insomnia 02/03/2020   Pain in female genitalia on intercourse 01/29/2020   Impingement syndrome of right shoulder 10/28/2018   Chronic pain of left knee 03/27/2018   Chronic right shoulder pain 03/27/2018   Cervicalgia 03/27/2018   Unilateral primary osteoarthritis, left knee 08/06/2017   Tremor 07/10/2017   Trochanteric bursitis, right hip 09/27/2016   GAD (generalized anxiety disorder) 05/31/2014   Rectocele 08/21/2011   Acute cough 10/25/2010   Hypertension 10/13/2010    PCP: Felicita Horns, FNP  REFERRING PROVIDER: Liam Redhead, MD  REFERRING DIAG: M25.561,M25.562,G89.29 (ICD-10-CM) - Chronic pain of both knees   THERAPY DIAG:  Muscle weakness (generalized)  Chronic pain of left knee  Other abnormalities of gait and mobility  Other low back pain  Rationale for Evaluation and Treatment: Rehabilitation  ONSET DATE: Chronic, 2019  SUBJECTIVE:   SUBJECTIVE STATEMENT: Pt presents to PT with reports of pain in L knee and lower back. Also requests decrease in strenuous activity today due to hemorrhoid flare.   EVAL: Pt comes into PT using a single point cane with c/c of bilateral knee pain that she rates a 6/10 at best. Pt had a L TKA in 2020 which she did PT for but says she struggles with bending the L knee. Pt largest concern is inflammation and tightness. Describes knee pain as sharp, dull, achy, etc. Dependent on the time. Additionally has concerns with her left quad which she says cannot take any loading or pressure. Patient reports she bruises very easily and experiences a lot of pain. Challenges include  transferring from the floor, stairs, squatting, walking, etc. Has worn orthotic inserts and wears ASICS or Brooks. Patient took pain medicine before treatment session which she takes 3 times a day on a regular basis. Patient wants to get her back looked at as well.   PERTINENT HISTORY: TKA 2020 Depression PAIN:  Are you having pain?  Yes: NPRS scale: 8/10  Worst: 10/10 Pain location: Both knees Pain description: sharp, dull, achy, etc. Aggravating factors: Everything Relieving factors: Pain medication   PRECAUTIONS: None  RED FLAGS: None   WEIGHT BEARING RESTRICTIONS: No  FALLS:  Has patient fallen in last 6 months? No but reports losing balance a lot.   LIVING ENVIRONMENT: Lives with: lives with their partner Lives in: House/apartment Stairs: Yes, External, 3 stairs.  Has following equipment at home: Single point cane and Wheelchair (  power)  OCCUPATION: None  PLOF: Independent  PATIENT GOALS: Wants to get back to a regular life. Cannot clean, cook because standing bothers her, play with her grand kids, etc.   OBJECTIVE:  Note: Objective measures were completed at Evaluation unless otherwise noted.  PATIENT SURVEYS:  FOTO 25% ODI: 34/50 - 68% disability  09/19/2023: 56% disability  10/31/2023: 44% disability   COGNITION: Overall cognitive status: Within functional limits for tasks assessed     SENSATION: WFL Loss of sensation of lateral knee after TKA  EDEMA:  Notable swelling in left knee  MUSCLE LENGTH: Hamstrings: Both tight   POSTURE: rounded shoulders and forward head  PALPATION: Tight hamstrings, edema in knee cap, tight gastroc.  LOWER EXTREMITY ROM:  Active ROM Right eval Left eval Left 08/01/23  Hip flexion     Hip extension     Hip abduction     Hip adduction     Hip internal rotation     Hip external rotation     Knee flexion 135 110 130  Knee extension -1 -1   Ankle dorsiflexion     Ankle plantarflexion     Ankle inversion      Ankle eversion      (Blank rows = not tested)  LOWER EXTREMITY MMT:  MMT Right eval Left eval  Hip flexion 4- 4-  Hip extension    Hip abduction 4- 3+  Hip adduction    Hip internal rotation    Hip external rotation    Knee flexion 4 4  Knee extension 4 4  Ankle dorsiflexion    Ankle plantarflexion    Ankle inversion    Ankle eversion     (Blank rows = not tested)  FUNCTIONAL TESTS:  30 seconds chair stand test: 9 reps  10/31/2023:   GAIT: Distance walked: 20 ft Assistive device utilized: None Level of assistance: Complete Independence Comments: Limited DF, limited extension, limited hip flexion, lack of pressure through L LE.                                                                                                                          TREATMENT: [facility] Adult PT Treatment:                                                Therapeutic Exercise:  Supine QS x 10 - 5" hold SLR 2x15 each S/L x 15 each Supine PPT x 10 - 5" hold Supine PPT with march x 20 Supine ITB stretch with strap 2x30" L Lateral walk RTB x 3 laps Standing hip abd/ext x 10 RTB Bridge 2x10 GTB Therapeutic Activity:  Assessment of tests/measures, goals, and outcomes for discharge  PATIENT EDUCATION:  Education details: continue HEP Person educated: Patient Education method: Explanation, Demonstration, Tactile cues, Verbal cues, and Handouts Education comprehension: verbalized understanding,  returned demonstration, and needs further education  HOME EXERCISE PROGRAM: Access Code: [email or web address] URL: [email or web address] Date: 10/31/2023 Prepared by: Loral Roch  Exercises - Supine Quad Set  - 1 x daily - 7 x weekly - 3 sets - 10 reps - 5 sec hold - Active Straight Leg Raise with Quad Set (Mirrored)  - 1 x daily - 7 x weekly - 3 sets - 15 reps - Sidelying Hip Abduction  - 1 x daily - 7 x weekly - 3 sets - 15 reps - Seated Hamstring Stretch  - 1 x daily - 7 x weekly - 3 reps - 30  sec hold - Supine Hamstring Stretch with Strap  - 1 x daily - 7 x weekly - 3 reps - 30 sdc hold - Supine Posterior Pelvic Tilt  - 1 x daily - 7 x weekly - 2 sets - 10 reps - 3 sec hold - Supine Lower Trunk Rotation  - 1 x daily - 7 x weekly - 10 reps - 5 sec  hold - Supine Bridge with Resistance Band  - 1 x daily - 7 x weekly - 3 sets - 10 reps - Supine March with Posterior Pelvic Tilt  - 1 x daily - 7 x weekly - 20 reps - Supine ITB Stretch with Strap  - 1 x daily - 7 x weekly - 2-3 reps - 30 sec hold - Side Stepping with Resistance at Ankles and Counter Support  - 1 x daily - 7 x weekly - 3 reps - red band hold - Standing Hip Abduction with Resistance at Ankles and Counter Support  - 1 x daily - 7 x weekly - 2 sets - 10 reps - red band hold - Standing Hip Extension with Resistance at Ankles and Counter Support  - 1 x daily - 7 x weekly - 2 sets - 10 reps - red band hold - Wall Squat with Swiss Ball  - 1 x daily - 7 x weekly - 2 sets - 10 reps - Wall Squat  - 1 x daily - 7 x weekly - 2 reps - 30 sec hold  ASSESSMENT:  CLINICAL IMPRESSION: Pt was able to complete prescribed exercises and demonstrated knowledge of HEP with no adverse effect. Over the course of PT she has progressed very well, showing improved functional mobility, strength, and decreasing various degrees of pain in L knee and back. She should continue to improve HEP compliance and will continue to be seen and progressed as able.   EVAL: Patient is a 58 y.o. female who was seen today for physical therapy evaluation and treatment for bilateral knee pain, more in the left knee than right. Patient exhibits weakness in the LE and tightness, particularly in the hamstring and calf. FOTO score indicates sharp decrease in subjective functional ability below PLOF. Patient would benefit from skilled PT to decrease pain and increase functional ability.   OBJECTIVE IMPAIRMENTS: Abnormal gait, decreased activity tolerance, decreased balance,  decreased ROM, and decreased strength.   ACTIVITY LIMITATIONS: carrying, lifting, bending, sitting, standing, squatting, stairs, and transfers  PARTICIPATION LIMITATIONS: meal prep, cleaning, laundry, driving, and community activity  PERSONAL FACTORS: Past/current experiences, Time since onset of injury/illness/exacerbation, and 3+ comorbidities: see PMH aboveare also affecting patient's functional outcome.   REHAB POTENTIAL: Good  CLINICAL DECISION MAKING: Stable/uncomplicated  EVALUATION COMPLEXITY: Moderate   GOALS: Goals reviewed with patient? Yes  SHORT TERM GOALS: Target date: 08/01/2023  Patient will be I with initial HEP in order to  progress with therapy. Baseline: Goal status: MET  2.  Patient will score >/= 5/10 at worse on the NPS by 3rd visit in order to show an improvement in pain allowing for an increase in functional ability.   Baseline: 6/10 best Goal status: MOSTLY MET  LONG TERM GOALS: Target date: 10/18/2023   Patient will be independent with final HEP in order to continue treatment at home for pain tolerance.   Baseline:  Goal status: MET  2.  Patient will score >/= 4/10 at worse to show an improvement in pain allowing for an increase in functional ability.  Baseline: 6/10 best Goal status: MOSTLY MET  3.  Patient will score at least 45% on FOTO to signify clinically meaningful improvement in functional abilities.  Baseline: 25% Goal status: DNT  4.  Patient will get all MMT scores to a 4/5 on the L LE. Baseline: see above Goal status: MET  5. Patient will increase 30 Second Sit to Stand rep count to no less than 11 reps for improved balance, strength, and functional mobility Baseline: 9 reps  10/31/2023: 11 reps Goal status: MET   6. Pt will be decrease ODI disability score to no greater than 54% as proxy for functional improvement Baseline: 68% disability  09/19/2023: 56% disability  10/31/2023: 44% disability    Goal status:  MET   PLAN:  PT FREQUENCY: 1x/week  PT DURATION: 8 weeks  PLANNED INTERVENTIONS: 97110-Therapeutic exercises, 97530- Therapeutic activity, 97112- Neuromuscular re-education, 97535- Self Care, 09811- Manual therapy, [phone]- Gait training, [phone]- Vasopneumatic device, Patient/Family education, Balance training, Stair training, Joint mobilization, Joint manipulation, Spinal manipulation, Spinal mobilization, Cryotherapy, and Moist heat  PLAN FOR NEXT SESSION: Continue working on strengthening of the quad, look at lower back next session.    Ivor Mars PT  10/31/23 1:00 PM

## 2023-11-10 ENCOUNTER — Other Ambulatory Visit: Payer: Self-pay | Admitting: Family

## 2023-11-10 DIAGNOSIS — R053 Chronic cough: Secondary | ICD-10-CM

## 2023-11-10 DIAGNOSIS — R0789 Other chest pain: Secondary | ICD-10-CM

## 2023-11-13 ENCOUNTER — Other Ambulatory Visit: Payer: Self-pay | Admitting: Physical Medicine and Rehabilitation

## 2023-11-13 ENCOUNTER — Other Ambulatory Visit: Payer: Self-pay | Admitting: Internal Medicine

## 2023-11-13 ENCOUNTER — Other Ambulatory Visit: Payer: Self-pay | Admitting: Family

## 2023-11-13 DIAGNOSIS — K219 Gastro-esophageal reflux disease without esophagitis: Secondary | ICD-10-CM

## 2023-11-13 DIAGNOSIS — L409 Psoriasis, unspecified: Secondary | ICD-10-CM

## 2023-11-26 ENCOUNTER — Encounter: Payer: Self-pay | Admitting: Physical Medicine and Rehabilitation

## 2023-11-26 ENCOUNTER — Encounter: Attending: Physical Medicine and Rehabilitation | Admitting: Physical Medicine and Rehabilitation

## 2023-11-26 VITALS — BP 129/79 | HR 67 | Ht 67.0 in | Wt 172.0 lb

## 2023-11-26 DIAGNOSIS — R7303 Prediabetes: Secondary | ICD-10-CM | POA: Insufficient documentation

## 2023-11-26 DIAGNOSIS — G4700 Insomnia, unspecified: Secondary | ICD-10-CM | POA: Diagnosis not present

## 2023-11-26 DIAGNOSIS — M79642 Pain in left hand: Secondary | ICD-10-CM | POA: Diagnosis not present

## 2023-11-26 DIAGNOSIS — M79641 Pain in right hand: Secondary | ICD-10-CM | POA: Insufficient documentation

## 2023-11-26 DIAGNOSIS — L603 Nail dystrophy: Secondary | ICD-10-CM | POA: Diagnosis not present

## 2023-11-26 DIAGNOSIS — M25561 Pain in right knee: Secondary | ICD-10-CM | POA: Insufficient documentation

## 2023-11-26 DIAGNOSIS — M25562 Pain in left knee: Secondary | ICD-10-CM | POA: Diagnosis not present

## 2023-11-26 DIAGNOSIS — G8929 Other chronic pain: Secondary | ICD-10-CM | POA: Insufficient documentation

## 2023-11-26 DIAGNOSIS — F411 Generalized anxiety disorder: Secondary | ICD-10-CM | POA: Insufficient documentation

## 2023-11-26 MED ORDER — GABAPENTIN 600 MG PO TABS: 1200.0000 mg | ORAL_TABLET | Freq: Three times a day (TID) | ORAL | 3 refills | Status: AC

## 2023-11-26 MED ORDER — METHOCARBAMOL 500 MG PO TABS: 500.0000 mg | ORAL_TABLET | Freq: Three times a day (TID) | ORAL | 3 refills | Status: AC | PRN

## 2023-11-26 MED ORDER — GABAPENTIN 100 MG PO CAPS: 200.0000 mg | ORAL_CAPSULE | Freq: Three times a day (TID) | ORAL | 3 refills | Status: AC | PRN

## 2023-11-26 MED ORDER — MELOXICAM 15 MG PO TABS: 15.0000 mg | ORAL_TABLET | Freq: Every day | ORAL | 3 refills | Status: DC

## 2023-11-26 NOTE — Patient Instructions (Signed)
 2 brazil nuts  Zinc 

## 2023-11-26 NOTE — Progress Notes (Signed)
 Subjective:    Patient ID: Sharon [name], female    DOB: [birth year], 58 y.o.   MRN: [id number]  HPI: [name] is a 58 year old woman who presents for f/u insomnia, bilateral knee pain following left knee replacement in 04/2019, transaminitis, shortness of breath, brain fog, and prediabetes.   1) Bilateral knee pain L>R: -PT is going well- she did it for almost 5 months! -grandkids are doing well- she has been helping with them -her boy friend is going through depression -her pain was aggravated by her recent [facility] trip because of the loading and the unloading -needs refill of gabapentin , meloxicam , robaxin  -she is interested in seeing Dr. Hermina Loosen for a second opinion after her vacation in June -right knee pain is getting worse -taking meloxicam  as needed, she took it for three weeks and it caused breathing issues.  -has not tried tens unit before, her boy friend has one -pain was worse after changing her bed sheets -getting a new bike and she is hoping that strengthening her muscles will help -went to church a couple times but it hurt so badly. -right knee pain has been getting worse -Her ROM is still restricted in her left knee.  -ice helps -radiates between hip and left knee Feels like a charley horse -She asks whether the scar tissue will ever heal.  -Cannot sleep without a pillow underneath her legs.  -She has follow-up with Dr. Carry Clapper and Dr. Lucienne Ryder -Currently is not planning to pursue surgery.  -She has been off hydrocodone .  -She would be interested in increasing the Gabapentin  dose.  -She would like to discuss an option for inflammation. She has tried aspirations but the fluid comes back right away.  -She got a  Steroid injection and experienced worsening range of motion in her left knee- that was from Dr. Carry Clapper. He also pulled fluid off. It does help with pain, but she does not want another given her worsening range of motion afterward. She does have  improved sensation in her knee after the injection! -has been hurting -discussed Qutenza  as a treatment option -her current regimen is working for her.  -has been having digestive issues and stopped meloxicam .  -pain has been severe lately -she has decreased her robaxin  to daily but this has not improved her dry mouth  2) Anxiety: This has much improved with the Gabapentin . 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  -she has used blue emu oil and diclofenac  gel without great benefit -has appointment with physician today.  -she does use her hands a lot.  -she has been dropping things recently.   9) lower back pain -PT is working on lower back as well -has gotten worse -got Xrs and was shown to have cyst on right side and bulging disc on left side and pinched nerve in the middle.  -she feels a constant burning -worst on left side -feels she is walking crooked -she had an injection by Dr. Margery Sheets at orthopedics but these did  not help.  -she know has a bone further up that is bothering her.  -left pain in her boy friend's car so had a hard time getting in today -hurts severely sometimes -pinches when she walks -sitting also hurts -she does not have insurance right now -ice helps -she uses heat for the muscular pain -hurt it while bending and turning -has been worse since she carried her grandchild -she is unable to vacuum.  -discussed therapy and using lidocaine  patch, massage.  -she is ok using gabapentin  and her muscle  relaxers.  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  -ready to try Qutenza  today -has been severe recently -plans to follow-up with her orthopedic surgeon.   10) Constipation -has been needing to take laxatives.   11) shortness of breath -worried abut blot clots -she asks about results of her vascular ultrasound of her lower extremity and what treatment is necessary, how she would know if there are clots elsewhere in her body, and how she may have developed these clots.  -she has being set up for tests  12) impaired memory -has improved since stopping klonopin /reduced added sugars in diet  13) Prediabetes -she asks about HgbA1c test result  14) Transaminitis -she asks whether any of her medications could have caused this'  15) Food allergy  -she asks about her food allergy  results  16) Insomnia: -asks if there is something else she can try for her insomnia -she has been trying to wean off her Klonopin  but this makes it harder for her to fall asleep -the Requip  caused dry mouth and she is hesitant about its potential side effects -she has not tried tart cherry juice or melatonin  17) Chest pain -has been radiating to left arm -she follows with a cardiologist Prior history:  Since last visit she has stopped Norco as it caused respiratory distress. We tried Cymbalta  40mg  without benefit or side effects. She has tried voltaren  gel, blue emu oil without benefit. She has tried CBD.    She has been trying to improve quadriceps musculature. She discusses some of the exercises she does.   She has been denied in her disability. She has talked to an attorney.  She continues to ambulate with a cane.   She has tried the compounding cream and that does not help much. She has been trying Hemp cream which is helping.  She is getting foot insoles from the podiatrist. They should be ready in a week or two.   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   Can ride the stationary bicycle and that does not hurt. She sues it every day. She uses leg weights. When   Scar tissue grew back quickly post-surgery. She could not get PT appointments for 3 weeks.   Her surgeon does not want her to break the hardware and he may have to do arthroscopic procedure to flush out the inflammation.   She has tried ice which stopped helping, nabumetone  750 mg BID, Norco 1-2 tablets three times per day PRN (she has 12 left). She uses diclofenac  gel   She takes Miralax  for constipation. She takes up to 2 times per day. She has a history of IBS.   Friend[facility] Home in [place]- she was a Scientist, physiological. They let her go because she did not return in 12 weeks.   She  was doing pool therapy but chemicals in the pool were increased because of COVID.  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 Notes reviewed- recently had a right subacromial injection.   Current pain is 8/10.   12) Hip pain -pain is best in the water -PT is going well -they are working on her steps- she is ok with small steps, but not big ones -she is able to squat a little -muscles are building  13) Stress: -it was stressful for her to help plan her daughter's baby shower  14) Anxiety: -her boy friend is talking about leaving again -she has been having a lot of anxiety about this  15) Fatigue -PT has been going well   Pain Inventory Average Pain 7 Pain Right Now 8 My pain is constant, burning, dull, stabbing, and aching  In the last 24 hours, has pain interfered with the following? General activity 8 Relation with others 8 Enjoyment of life 8 What TIME of day is your pain at its worst? daytime,  Sleep (in general) Good  Pain is worse with: walking, bending, sitting, inactivity, and standing, some activities Pain improves with: rest, heat/ice, and medication Relief from  Meds:7     Family History  Problem Relation Age of Onset   Emphysema Mother        smoker   Allergies Mother    Asthma Mother    Heart disease Mother        AMI as cause of death   COPD Mother    Asthma Father    Heart disease Father        AMI as cause of death   Diabetes Father    Asthma Brother    Heart disease Brother 39       heart failure   Lung cancer Maternal Grandfather        was a smoker   Cancer Maternal Grandfather        lung   Heart disease Paternal Grandmother        AMI   Heart disease Paternal Grandfather        AMI   Colon cancer Neg Hx    Social History   Socioeconomic History   Marital status: Divorced    Spouse name: Not on file   Number of children: 1   Years of education: Not on file   Highest education level: Not on file  Occupational History   Occupation: IT sales professional: [employer]  Tobacco Use   Smoking status: Never   Smokeless tobacco: Never  Vaping Use   Vaping status: Never Used  Substance and Sexual Activity   Alcohol use: Not Currently    Comment: occ   Drug use: No    Comment: former maryjuana use- quit in 1995   Sexual activity: Yes    Partners: Male    Birth control/protection: Surgical  Other Topics Concern   Not on file  Social History Narrative   Marital status: divorced x 2; dating seriously x 3 years.        Children:  1 daughter (75); no grandchildren      Lives:  With boyfriend.        Employment: works at [employer]      Tobacco: none      Alcohol: rarely; socially      Exercise: none; has treadmill and elliptical and bikes   Coffee in am / 1/2 of coke in afternoon  High school education      01/29/20   From: the area   Living: alone - but boyfriend living with her [place] (2020) but knew each other in high school   Work: on disability - working on appeal      Family: one daughter Candyce Champagne - one grandchild coming in January       Enjoys: relax and watch tv, use to  like fishing/boating/rafting, reading, crochet       Exercise: rehab exercises   Diet: not great - limited intake      Safety   Seat belts: Yes    Guns: Yes  and secure   Right handed   One story home   Drinks caffeine   Social Drivers of Corporate investment banker Strain: Low Risk  (03/27/2023)   Overall Financial Resource Strain (CARDIA)    Difficulty of Paying Living Expenses: Not hard at all  Food Insecurity: No Food Insecurity (03/27/2023)   Hunger Vital Sign    Worried About Running Out of Food in the Last Year: Never true    Ran Out of Food in the Last Year: Never true  Transportation Needs: No Transportation Needs (03/27/2023)   PRAPARE - Administrator, Civil Service (Medical): No    Lack of Transportation (Non-Medical): No  Physical Activity: Inactive (03/27/2023)   Exercise Vital Sign    Days of Exercise per Week: 0 days    Minutes of Exercise per Session: 0 min  Stress: No Stress Concern Present (03/27/2023)   [employer] of Occupational Health - Occupational Stress Questionnaire    Feeling of Stress : Not at all  Social Connections: Moderately Isolated (03/27/2023)   Social Connection and Isolation Panel [NHANES]    Frequency of Communication with Friends and Family: More than three times a week    Frequency of Social Gatherings with Friends and Family: More than three times a week    Attends Religious Services: More than 4 times per year    Active Member of [employer] or Organizations: No    Attends Engineer, structural: Never    Marital Status: Divorced   Past Surgical History:  Procedure Laterality Date   ABDOMINAL HYSTERECTOMY     still with both ovaries   BREAST BIOPSY Left 12/19/2022   US  LT BREAST BX W LOC DEV 1ST LESION IMG BX SPEC US  GUIDE 12/19/2022 [facility]   CATARACT EXTRACTION Bilateral 2015   COLONOSCOPY     CYSTOSCOPY W/ URETERAL STENT PLACEMENT Right 05/23/2017   Procedure: CYSTOSCOPY WITH RETROGRADE  PYELOGRAM/URETERAL RIGHT STENT PLACEMENT;  Surgeon: Erman Hayward, MD;  Location: [facility];  Service: Urology;  Laterality: Right;   CYSTOSCOPY W/ URETERAL STENT PLACEMENT Right 06/04/2017   Procedure: CYSTOSCOPY WITHRIGHT RETROGRADE PYELOGRAMRIGHT /URETEREOSCOPY  STENT PLACEMENT;  Surgeon: Samson Croak, MD;  [facility];  Service: Urology;  Laterality: Right;   ENDOMETRIAL ABLATION     ESOPHAGOGASTRODUODENOSCOPY (EGD) WITH PROPOFOL  N/A 08/16/2022   Procedure: ESOPHAGOGASTRODUODENOSCOPY (EGD) WITH PROPOFOL ;  Surgeon: Selena Daily, MD;  Location: [facility];  Service: Gastroenterology;  Laterality: N/A;   EYE SURGERY     HEMORRHOID SURGERY  2011   INCONTINENCE SURGERY     INNER EAR SURGERY     X 2   RECTAL PROLAPSE REPAIR     RETINAL DETACHMENT SURGERY Bilateral 2000   TOTAL KNEE ARTHROPLASTY Left 04/29/2019   Procedure: LEFT TOTAL KNEE ARTHROPLASTY;  Surgeon: Arnie Lao, MD;  Location: [facility];  Service: Orthopedics;  Laterality: Left;   TUBAL LIGATION     VEIN SURGERY Left 04/2010   Past Medical History:  Diagnosis Date   Allergy     Anal fissure    Anemia    borderline   Anxiety    Arthritis    Blood clot in vein    left leg   Detached retina    BOTH SCLERA BUCKLE BOTH EYES   DVT (deep venous thrombosis) (HCC)    left leg   Endometriosis    Family history of adverse reaction to anesthesia    DAUGHTER POST OP PONV   GERD (gastroesophageal reflux disease)    Glaucoma    RIGHT WORST THAN LEFT   Heart murmur    "caused by anxiety"    History of hemorrhoids    History of kidney stones    Hypertension    IBS (irritable bowel syndrome)    Perforated eardrum, right    SMALL HOLE   PONV (postoperative nausea and vomiting)    Pulmonary embolism (HCC) 6 YRS AGO   Status post arthroscopy of left knee 08/06/2017   LMP 10/11/2010   Opioid Risk Score:   Fall Risk Score:  `1  Depression screen PHQ 2/9     10/09/2023     3:52 PM 08/28/2023    1:05 PM 04/30/2023   12:20 PM 04/10/2023   12:54 PM 03/27/2023    2:11 PM 01/29/2023    2:27 PM 10/24/2022    2:28 PM  Depression screen PHQ 2/9  Decreased Interest 2 0 2 2 0 0   Down, Depressed, Hopeless 1 0 1 0 0 0   PHQ - 2 Score 3 0 3 2 0 0   Altered sleeping 0  0 1   1  Tired, decreased energy 3  3 3   1   Change in appetite 1  0 0     Feeling bad or failure about yourself  0  0 0     Trouble concentrating 0  0 0     Moving slowly or fidgety/restless 0  0 0     Suicidal thoughts 0  0 0     PHQ-9 Score 7  6 6      Difficult doing work/chores Very difficult   Very difficult      Review of Systems  Constitutional: Negative.        Weight loss  HENT: Negative.   Eyes: Negative.   Respiratory: Positive for shortness of breath.   Cardiovascular: Negative.   Gastrointestinal: Negative.   Endocrine: Negative.   Genitourinary: Negative.   Musculoskeletal: Positive for arthralgias, back pain and gait problem.       Spasms  Skin: Negative.   Allergic/Immunologic: Negative.   Neurological: Positive for tremors and numbness.  Hematological: Negative.   Psychiatric/Behavioral:       Anxiety  All other systems reviewed and are negative.      Objective:  PRIOR EXAM: Gen: no distress, normal appearing, BMI 26.16, weight 167 lbsm BP 133/81 HEENT: oral mucosa pink and moist, NCAT Cardio: Reg rate Chest: normal effort, normal rate of breathing Abd: soft, non-distended Ext: no edema Psych: pleasant, normal affect Skin: intact Neuro: Alert and oriented x3 MSK: ambulates with cane, decreased range of motion in bilateral knees  Assessment & Plan:  Mrs. Collison is a 58 year old woman who presents for follow-up:   1) Left knee pain post-surgery: -discussed 2nd opinion with Dr. Hermina Loosen to  see if she has reactivity to any of the metals in her implant -refilled gabapentin , meloxicam , robaxin  -discussed that knee pain has worsened since she has been weaning off her  medications to avoid their potential side effects -discussed that even activities such as changing her bed sheets can aggravate her pain -She has a plan for repeat surgical procedure with Dr. Lucienne Ryder that will hopefully provide relief.  -She has been icing regularly- recommended three tines per day for 15 minutes. Discussed that this decreases blood flow and can impair healing but help with swelling and pain relief.  -discussed the limitations of her pain on her function -discussed extracorporeal shockwave therapy and how this can help break apart scar tissue -unable to work due the level of her pain -discussed current impact on her life -continue cane for balance -She has been through physical therapy and performs her exercises diligently. --Discussed Sprint PNS system as an option of pain treatment via neuromodulation. Provided following link for patient to learn more about the system: https://www.sprtherapeutics.com/. She does not like the feeling of things under her skin and defers that option.  -She weaned herself all the steroids.  -Provided with a pain relief journal and discussed that it contains foods and lifestyle tips to naturally help to improve pain. Discussed that these lifestyle strategies are also very good for health unlike some medications which can have negative side effects. Discussed that the act of keeping a journal can be therapeutic and helpful to realize patterns what helps to trigger and alleviate pain.   -discussed mechanism of action of low dose naltrexone as an opioid receptor antagonist which stimulates your body's production of its own natural endogenous opioids, helping to decrease pain. Discussed that it can also decrease T cell response and thus be helpful in decreasing inflammation, and symptoms of brain fog, fatigue, anxiety, depression, and allergies. Discussed that this medication needs to be compounded at a compounding pharmacy and can more expensive. Discussed  that I usually start at 1mg  and if this is not providing enough relief then I titrate upward on a monthly basis.   -Discussed Qutenza  as an option for neuropathic pain control. Discussed that this is a capsaicin  patch, stronger than capsaicin  cream. Discussed that it is currently approved for diabetic peripheral neuropathy and post-herpetic neuralgia, but that it has also shown benefit in treating other forms of neuropathy. Provided patient with link to site to learn more about the patch: https://www.qutenza .com/. Discussed that the patch would be placed in office and benefits usually last 3 months. Discussed that unintended exposure to capsaicin  can cause severe irritation of eyes, mucous membranes, respiratory tract, and skin, but that Qutenza  is a local treatment and does not have the systemic side effects of other nerve medications. Discussed that there may be pain, itching, erythema, and decreased sensory function associated with the application of Qutenza . Side effects usually subside within 1 week. A cold pack of analgesic medications can help with these side effects. Blood pressure can also be increased due to pain associated with administration of the patch.  -continue ice     -continue Robaxin  as she is nervous about feeling too sleepy with the Tizanidine , and the Robaxin  does help.   Continue gabapentin  to 600mg  TID PRN.   -Continue neoprene knee sleeve for proprioception  -She has tried Celebrex  and Tramadol  without benefit. She had respiratory distress with hydrocodone .   -She had worse pain with steroid injection in her left knee.   -Discussed steroids but she did  not like the side effect profile. Discussed the different steroid options. She weaned off these because she developed mouth sores.   -Continue turmeric and cherries.   -Continue Lidocaine  patches today.   -She would like rheum panel checked: ordered, reviewed results which were negative, and discussed with  patient.  -Refilled meloxicam .   2) Bunion:  -continue orthotics  3) Irritable bowel syndrome: -She does not eat much sugar or sweets.  -She drinks 1 coke per day.  -She cuts tea out.  -She drinks water, coffee.  -She has been going more regularly and has not needed her Miralax .  -food allergy  testing ordered  4) Impaired Concentration: - This persists -She forgets things easily.  -She tries not to multitask when she was with her grandmother.   5) Anxiety: The Gabapentin  is helping with this as well, continue -discussed plan to start ecitalopram and that she has cleared this with her ophthalmologist -discussed increasing trazodone  -Buspar  prescribed, discussed that she has not yet tried this but plans to -discussed that this has improved since her boy friend moved out  6) Vulvodynia: -discussed various treatment options: biofeedback, medications, continue estradiol .  -continue Amitriptyline  10mg  HS, discussed moving dose earlier in the evening.  -discussed that sex is still painful  7) Cervical and lumbar myofascial pain: -try massage -discussed benefits of therapy and trigger point injections -apply lidocaine  patch.   8) Bilateral hand pain: likely secondary to arthritis compounded by carpal tunnel syndrome -coontinue Gabapentin  which is helping -continue f/u with surgery as planned.   9) Low back pain, appears to be secondary to facet arthritis: -discussed that PT was helpful Continue HEP -discussed XR, but she defers due to potential cost -discussed surgery with an orthopedic and she would like to consider this but her insurance would not cover it.  -discussed lack of improvement with injections from Dr. Margery Sheets.  -continue lidocaine  patches, discussed that these make her feel weird when she takes them off.   Continue Ketamine 10%, Baclofen 2%, Cyclobenzaprine 2%, Ketoprofen 10%, Gabapentin  6%, Bupivacaine  1%, Amitryptiline 5%, clonidine 0.2%, continue, discussed this  is helpful  Dispense 240 g  Apply 1-2 g to affected area 3-4 times per day  -continue postural correction -Provided with a pain relief journal and discussed that it contains foods and lifestyle tips to naturally help to improve pain. Discussed that these lifestyle strategies are also very good for health unlike some medications which can have negative side effects. Discussed that the act of keeping a journal can be therapeutic and helpful to realize patterns what helps to trigger and alleviate pain.   -Discussed following foods that may reduce pain: 1) Ginger (especially studied for arthritis)- reduce leukotriene production to decrease inflammation 2) Blueberries- high in phytonutrients that decrease inflammation 3) Salmon- marine omega-3s reduce joint swelling and pain 4) Pumpkin seeds- reduce inflammation 5) dark chocolate- reduces inflammation 6) turmeric- reduces inflammation 7) tart cherries - reduce pain and stiffness 8) extra virgin olive oil - its compound olecanthal helps to block prostaglandins  9) chili peppers- can be eaten or applied topically via capsaicin  10) mint- helpful for headache, muscle aches, joint pain, and itching 11) garlic- reduces inflammation  Link to further information on diet for chronic pain: http://www.bray.com/   10) shoulder pain -used to get shots in the pt, but laying on her back has helped to decrease her shoulder pain  11) Acid reflux -continues famotidine .   11) Soreness in legs -vascular ultrasound ordered of both legs -discussed her history  of clots -discussed that a D-dimer test can suggest clot if elevated.   12) Irritable bowel syndrome -discussed her current diet -discussed that she does not eat breakfast due to nausea -discussed her diarrhea and constipation, more diarrheal type at this time.  -discussed her aunt's advise to have a tsp coconut oil daily.   -recommended bioptemizer's magnesium  breakthrough -reviewed food allergies with her and made goal for 4 week elimination of foods with the highest responses  13) Insomnia/Shortness of breath with history of clots -discussed that insomnia has improved -discussed that there is no evidence of PE on her CTA -discussed her follow-up with PCP -discussed that her vascular ultrasound of her lower extremities shows a chronic left peroneal CVT  -recommended starting nattokinase to help thin the blood and lower her risk for clots, or to eat natto -discussed the importance of movement in clot prevention -referred for sleep study for shortness of breath -encouraged tart cherries or tart cherry juice  14) Impaired memory: -discussed her memory difficulties. -discussed that memory is improving since stopping her klonopin  and decreasing added sugar in her diet -discussed that some studies have linked chronic PPI use to memory impairment -discussed safe way to wean off these medications slowly  15) Hemorroids: Recommended Epsom salt baths  16) prediabetes -advised switching from sweetened black tea to black tea with allulose or honey - recommended avoiding processed foods/added sugars/bread/pasta/rice -recommended drinking a glass of water with either apple cide vinegar or lemon -recommended reading Wheat Belly and Grain Brain -recommended eating fruits, vegetables, yogurt, nuts, olive oil -discussed risks and benefits of metformin -discussed that repeat labs show normalization of HgbA1c! -discussed that she does not eat much anymore -encouraged getting enough protein -discussed that it is hard to maintain a good diet on the holidays -discussed that HgbA1c is 5.8 -continue PT for exercise  17) Transaminitis -recommended avoiding ultraprocessed foods -recommended avoiding/minimizing tylenol  -recommended supplement NAC 600mg  BID  -recommended drinking dandelion and milk thistle  teas -commended on stopping drinking soda products! -recommended avoiding processed creamer in coffee, continue drinking coffee -discussed that repeat CMP shows normalization of liver enzymes! -CMP ordered  18) Bilateral knee pain: -refilled gabapentin  -encouraged use of exercise bike to strengthen quadriceps tendon -continue robaxin , meloxciam -continue PT -discussed tens unit -discussed how the pain limits her ability work  19) Dry mouth: -stop amitriptyline   20) Benzodiazepine withdrawal: -discussed that this should be weaned slowly  31) Restless leg syndrome:  -discussed requip , will prescribe  32) Insomnia: -prescribed trazodone , discussed that this promotes increased deep sleep, discussed that this has helped -recommended tart cherry juice -discussed that she has been on Klonopin  for 12 years and is trying to wean off as her PCP is concerned it may be contributing to her brain fog -discussed that Klonopin  can contribute to memory decline and commended her for trying to wean off -discussed that she has been having dry mouth and she is not sure if it is from the Requip . She has stopped taking this in case and dry mouth has continued -discussed that robaxin  can also cause dry mouth -discussed taking muscle relaxer earlier in the evening so she does not have to wake at night to drink water  33) Chest pain: -discussed current symptoms -prescribed nitroglycerin   34) Elevated BUN -encouraged 6-8 glasses of water per day -discussed that creatinine is normal  35. Diffuse arthritis: -discussed potential benefits of boswellia -continue compounded cream, discussed potentially alternating with blue emu oil -discussed aquatherapy -prescribed PT  36.  Left hip pain: -continue PT  37. Impaired balance: -continue PT  38. Fatigue:  -recommended time outdoors   39. Brittle nails: -recommended collagen and 2 [place] nuts per day -TSH, T3, T4 ordered, discussed that these are  normal -discussed zinc  supplement

## 2023-11-29 DIAGNOSIS — G4733 Obstructive sleep apnea (adult) (pediatric): Secondary | ICD-10-CM | POA: Diagnosis not present

## 2024-02-14 ENCOUNTER — Other Ambulatory Visit: Payer: Self-pay | Admitting: Family

## 2024-02-14 DIAGNOSIS — R0789 Other chest pain: Secondary | ICD-10-CM

## 2024-02-14 DIAGNOSIS — R053 Chronic cough: Secondary | ICD-10-CM

## 2024-02-25 ENCOUNTER — Encounter: Admitting: Physical Medicine and Rehabilitation

## 2024-02-27 DIAGNOSIS — G4733 Obstructive sleep apnea (adult) (pediatric): Secondary | ICD-10-CM | POA: Diagnosis not present

## 2024-03-19 ENCOUNTER — Other Ambulatory Visit: Payer: Self-pay | Admitting: Internal Medicine

## 2024-03-19 DIAGNOSIS — K219 Gastro-esophageal reflux disease without esophagitis: Secondary | ICD-10-CM

## 2024-03-27 ENCOUNTER — Ambulatory Visit: Payer: Medicare HMO

## 2024-03-27 VITALS — Ht 67.0 in | Wt 172.0 lb

## 2024-03-27 DIAGNOSIS — Z0000 Encounter for general adult medical examination without abnormal findings: Secondary | ICD-10-CM | POA: Diagnosis not present

## 2024-03-27 NOTE — Patient Instructions (Signed)
 Sharon [name],  Thank you for taking the time for your Medicare Wellness Visit. I appreciate your continued commitment to your health goals. Please review the care plan we discussed, and feel free to reach out if I can assist you further.  Medicare recommends these wellness visits once per year to help you and your care team stay ahead of potential health issues. These visits are designed to focus on prevention, allowing your provider to concentrate on managing your acute and chronic conditions during your regular appointments.  Please note that Annual Wellness Visits do not include a physical exam. Some assessments may be limited, especially if the visit was conducted virtually. If needed, we may recommend a separate in-person follow-up with your provider.  Ongoing Care Seeing your primary care provider every 3 to 6 months helps us  monitor your health and provide consistent, personalized care.   Referrals If a referral was made during today's visit and you haven't received any updates within two weeks, please contact the referred provider directly to check on the status.  Recommended Screenings:  Health Maintenance  Topic Date Due   Pneumococcal Vaccine for age over 55 (1 of 2 - PCV) Never done   Hepatitis B Vaccine (1 of 3 - 19+ 3-dose series) Never done   Zoster (Shingles) Vaccine (1 of 2) Never done   Flu Shot  01/18/2024   COVID-19 Vaccine (4 - 2025-26 season) 02/18/2024   Breast Cancer Screening  11/27/2024   Medicare Annual Wellness Visit  03/27/2025   Colon Cancer Screening  01/06/2026   DTaP/Tdap/Td vaccine (2 - Td or Tdap) 11/09/2026   Hepatitis C Screening  Completed   HIV Screening  Completed   HPV Vaccine  Aged Out   Meningitis B Vaccine  Aged Out       07/11/2023    1:51 PM  Advanced Directives  Does Patient Have a Medical Advance Directive? No   Advance Care Planning is important because it: Ensures you receive medical care that aligns with your values, goals, and  preferences. Provides guidance to your family and loved ones, reducing the emotional burden of decision-making during critical moments.  Vision: Annual vision screenings are recommended for early detection of glaucoma, cataracts, and diabetic retinopathy. These exams can also reveal signs of chronic conditions such as diabetes and high blood pressure.  Dental: Annual dental screenings help detect early signs of oral cancer, gum disease, and other conditions linked to overall health, including heart disease and diabetes.  Please see the attached documents for additional preventive care recommendations.

## 2024-03-27 NOTE — Progress Notes (Signed)
 Subjective:   Sharon [name] is a 58 y.o. who presents for a Medicare Wellness preventive visit.  As a reminder, Annual Wellness Visits don't include a physical exam, and some assessments may be limited, especially if this visit is performed virtually. We may recommend an in-person follow-up visit with your provider if needed.  Visit Complete: Virtual I connected with  Sharon [name] on 03/27/24 by a audio enabled telemedicine application and verified that I am speaking with the correct person using two identifiers.  Patient Location: Home  Provider Location: Office/Clinic  I discussed the limitations of evaluation and management by telemedicine. The patient expressed understanding and agreed to proceed.  Vital Signs: Because this visit was a virtual/telehealth visit, some criteria may be missing or patient reported. Any vitals not documented were not able to be obtained and vitals that have been documented are patient reported.  VideoDeclined- This patient declined Librarian, academic. Therefore the visit was completed with audio only.  Persons Participating in Visit: Patient.  AWV Questionnaire: No: Patient Medicare AWV questionnaire was not completed prior to this visit.  Cardiac Risk Factors include: advanced age (>56men, >110 women);hypertension;sedentary lifestyle     Objective:    Today's Vitals   03/27/24 0924 03/27/24 0925  Weight: 172 lb (78 kg)   Height: 5' 7 (1.702 m)   PainSc:  7    Body mass index is 26.94 kg/m.     03/27/2024    9:51 AM 07/11/2023    1:51 PM 07/04/2023    3:23 PM 03/27/2023    2:13 PM 08/16/2022    7:43 AM 11/30/2021    2:59 PM 05/25/2020   10:22 AM  Advanced Directives  Does Patient Have a Medical Advance Directive? No No No Yes No Yes Yes  Type of Theme park manager;Living will     Copy of Healthcare Power of Attorney in Chart?    No - copy requested       Current  Medications (verified) Outpatient Encounter Medications as of 03/27/2024  Medication Sig   acyclovir  (ZOVIRAX ) 400 MG tablet Take 1 tablet by mouth daily.   albuterol  (VENTOLIN  HFA) 108 (90 Base) MCG/ACT inhaler INHALE 1-2 PUFFS BY MOUTH EVERY 6 HOURS AS NEEDED FOR WHEEZE OR SHORTNESS OF BREATH   benzonatate  (TESSALON ) 100 MG capsule Take 1 capsule (100 mg total) by mouth 3 (three) times daily as needed for cough. (Patient not taking: Reported on 03/27/2024)   bimatoprost (LUMIGAN) 0.03 % ophthalmic solution Place 1 drop into both eyes at bedtime.   busPIRone  (BUSPAR ) 5 MG tablet TAKE 1 TABLET BY MOUTH THREE TIMES A DAY   cefdinir  (OMNICEF ) 300 MG capsule Take 1 capsule (300 mg total) by mouth 2 (two) times daily. (Patient not taking: Reported on 11/26/2023)   Cholecalciferol  (VITAMIN D ) 50 MCG (2000 UT) tablet 1 tablet   clobetasol  (TEMOVATE ) 0.05 % external solution APPLY TO AFFECTED AREA TWICE A DAY   desonide  (DESOWEN ) 0.05 % cream Apply topically 2 (two) times daily.   EPINEPHrine  0.3 mg/0.3 mL IJ SOAJ injection Inject 0.3 mg into the muscle as needed for anaphylaxis.   estradiol  (ESTRACE ) 0.1 MG/GM vaginal cream Insert pea-sized amount nightly for 2 weeks then every other night   famotidine  (PEPCID ) 40 MG tablet TAKE 1 TABLET BY MOUTH EVERY DAY   gabapentin  (NEURONTIN ) 100 MG capsule Take 2 capsules (200 mg total) by mouth 3 (three) times daily as needed.   gabapentin  (  NEURONTIN ) 600 MG tablet Take 2 tablets (1,200 mg total) by mouth 3 (three) times daily.   lidocaine  (LIDODERM ) 5 % Apply 1 patch topically daily.   LUMIGAN 0.01 % SOLN 1 drop at bedtime.   meloxicam  (MOBIC ) 15 MG tablet Take 1 tablet (15 mg total) by mouth daily. (Patient not taking: Reported on 03/27/2024)   methocarbamol  (ROBAXIN ) 500 MG tablet Take 1 tablet (500 mg total) by mouth every 8 (eight) hours as needed for muscle spasms.   mometasone  (NASONEX ) 50 MCG/ACT nasal spray Place 2 sprays into the nose daily.   nebivolol   (BYSTOLIC ) 5 MG tablet TAKE 1 TABLET BY MOUTH EVERY DAY   nitrofurantoin , macrocrystal-monohydrate, (MACROBID ) 100 MG capsule Take 1 capsule (100 mg total) by mouth 2 (two) times daily.   nitroGLYCERIN  (NITROSTAT ) 0.4 MG SL tablet Place 1 tablet (0.4 mg total) under the tongue every 5 (five) minutes as needed for chest pain.   Nitroglycerin  (RECTIV ) 0.4 % OINT Place 0.4 inches rectally 2 (two) times daily as needed (For anal fissures.).   ondansetron  (ZOFRAN -ODT) 4 MG disintegrating tablet 1 tablet on the tongue and allow to dissolve Orally Once a day for 30 day(s)   pantoprazole  (PROTONIX ) 20 MG tablet TAKE 1 TABLET BY MOUTH EVERY DAY   pantoprazole  (PROTONIX ) 40 MG tablet TAKE 1 TABLET (40 MG TOTAL) BY MOUTH TWICE A DAY BEFORE MEALS   polyethylene glycol powder (GLYCOLAX /MIRALAX ) 17 GM/SCOOP powder See admin instructions.   primidone  (MYSOLINE ) 50 MG tablet Take 1 tablet in morning and 3 tablets at bedtime.   traZODone  (DESYREL ) 50 MG tablet Take 1 tablet (50 mg total) by mouth at bedtime as needed for sleep.   triamcinolone  cream (KENALOG ) 0.1 % Apply to affected areas hands twice daily until improved, then as needed for recurrence. Avoid applying to face, groin, and axilla. Use as directed. Long-term use can cause thinning of the skin.   vitamin B-12 (CYANOCOBALAMIN ) 1000 MCG tablet Take 1 tablet (1,000 mcg total) by mouth daily.   Zinc  50 MG TABS 1 tablet Orally Once a day for 30 day(s)   No facility-administered encounter medications on file as of 03/27/2024.    Allergies (verified) Bactrim  [sulfamethoxazole -trimethoprim ], Bee venom, Penicillins, Dextromethorphan, Elavil  [amitriptyline ], Hydrocodone -acetaminophen , Oxycodone , Penicillin g, Dextromethorphan polistirex er, Fluconazole , Hydrocodone , Oxycodone -acetaminophen , and Tape   History: Past Medical History:  Diagnosis Date   Allergy     Anal fissure    Anemia    borderline   Anxiety    Arthritis    Blood clot in vein    left leg    Detached retina    BOTH SCLERA BUCKLE BOTH EYES   DVT (deep venous thrombosis) (HCC)    left leg   Endometriosis    Family history of adverse reaction to anesthesia    DAUGHTER POST OP PONV   GERD (gastroesophageal reflux disease)    Glaucoma    RIGHT WORST THAN LEFT   Heart murmur    caused by anxiety    History of hemorrhoids    History of kidney stones    Hypertension    IBS (irritable bowel syndrome)    Perforated eardrum, right    SMALL HOLE   PONV (postoperative nausea and vomiting)    Pulmonary embolism (HCC) 6 YRS AGO   Status post arthroscopy of left knee 08/06/2017   Past Surgical History:  Procedure Laterality Date   ABDOMINAL HYSTERECTOMY     still with both ovaries   BREAST BIOPSY Left 12/19/2022   US   LT BREAST BX W LOC DEV 1ST LESION IMG BX SPEC US  GUIDE 12/19/2022 [facility]   CATARACT EXTRACTION Bilateral 2015   COLONOSCOPY     CYSTOSCOPY W/ URETERAL STENT PLACEMENT Right 05/23/2017   Procedure: CYSTOSCOPY WITH RETROGRADE PYELOGRAM/URETERAL RIGHT STENT PLACEMENT;  Surgeon: Gaston Hamilton, MD;  Location: [facility];  Service: Urology;  Laterality: Right;   CYSTOSCOPY W/ URETERAL STENT PLACEMENT Right 06/04/2017   Procedure: CYSTOSCOPY WITHRIGHT RETROGRADE PYELOGRAMRIGHT /URETEREOSCOPY  STENT PLACEMENT;  Surgeon: Carolee Sherwood JONETTA DOUGLAS, MD;  Location: [facility];  Service: Urology;  Laterality: Right;   ENDOMETRIAL ABLATION     ESOPHAGOGASTRODUODENOSCOPY (EGD) WITH PROPOFOL  N/A 08/16/2022   Procedure: ESOPHAGOGASTRODUODENOSCOPY (EGD) WITH PROPOFOL ;  Surgeon: Unk Corinn Skiff, MD;  Location: [facility];  Service: Gastroenterology;  Laterality: N/A;   EYE SURGERY     HEMORRHOID SURGERY  2011   INCONTINENCE SURGERY     INNER EAR SURGERY     X 2   RECTAL PROLAPSE REPAIR     RETINAL DETACHMENT SURGERY Bilateral 2000   TOTAL KNEE ARTHROPLASTY Left 04/29/2019   Procedure: LEFT TOTAL KNEE ARTHROPLASTY;  Surgeon: Vernetta Lonni GRADE, MD;   Location: [facility];  Service: Orthopedics;  Laterality: Left;   TUBAL LIGATION     VEIN SURGERY Left 04/2010   Family History  Problem Relation Age of Onset   Emphysema Mother        smoker   Allergies Mother    Asthma Mother    Heart disease Mother        AMI as cause of death   COPD Mother    Asthma Father    Heart disease Father        AMI as cause of death   Diabetes Father    Asthma Brother    Heart disease Brother 62       heart failure   Lung cancer Maternal Grandfather        was a smoker   Cancer Maternal Grandfather        lung   Heart disease Paternal Grandmother        AMI   Heart disease Paternal Grandfather        AMI   Colon cancer Neg Hx    Social History   Socioeconomic History   Marital status: Divorced    Spouse name: Not on file   Number of children: 1   Years of education: Not on file   Highest education level: Not on file  Occupational History   Occupation: IT sales professional: [employer]  Tobacco Use   Smoking status: Never   Smokeless tobacco: Never  Vaping Use   Vaping status: Never Used  Substance and Sexual Activity   Alcohol use: Not Currently    Comment: occ   Drug use: No    Comment: former maryjuana use- quit in 1995   Sexual activity: Yes    Partners: Male    Birth control/protection: Surgical  Other Topics Concern   Not on file  Social History Narrative   Marital status: divorced x 2; dating seriously x 3 years.        Children:  1 daughter (29); no grandchildren      Lives:  With boyfriend.        Employment: works at [employer]      Tobacco: none      Alcohol: rarely; socially      Exercise: none; has treadmill and  elliptical and bikes   Coffee in am / 1/2 of coke in afternoon    High school education      01/29/20   From: the area   Living: alone - but boyfriend living with her [place] (2020) but knew each other in high school   Work: on disability - working on appeal      Family: one daughter  Romualdo Ponto - one grandchild coming in January       Enjoys: relax and watch tv, use to like fishing/boating/rafting, reading, crochet       Exercise: rehab exercises   Diet: not great - limited intake      Safety   Seat belts: Yes    Guns: Yes  and secure   Right handed   One story home   Drinks caffeine   Social Drivers of Corporate investment banker Strain: Low Risk  (03/27/2024)   Overall Financial Resource Strain (CARDIA)    Difficulty of Paying Living Expenses: Not hard at all  Food Insecurity: No Food Insecurity (03/27/2024)   Hunger Vital Sign    Worried About Running Out of Food in the Last Year: Never true    Ran Out of Food in the Last Year: Never true  Transportation Needs: No Transportation Needs (03/27/2024)   PRAPARE - Administrator, Civil Service (Medical): No    Lack of Transportation (Non-Medical): No  Physical Activity: Inactive (03/27/2024)   Exercise Vital Sign    Days of Exercise per Week: 0 days    Minutes of Exercise per Session: 0 min  Stress: No Stress Concern Present (03/27/2024)   [employer] of Occupational Health - Occupational Stress Questionnaire    Feeling of Stress: Not at all  Social Connections: Moderately Isolated (03/27/2024)   Social Connection and Isolation Panel    Frequency of Communication with Friends and Family: More than three times a week    Frequency of Social Gatherings with Friends and Family: More than three times a week    Attends Religious Services: More than 4 times per year    Active Member of [employer] or Organizations: No    Attends Engineer, structural: Never    Marital Status: Divorced    Tobacco Counseling Counseling given: Not Answered    Clinical Intake:  Pre-visit preparation completed: Yes  Pain : 0-10 Pain Score: 7  Pain Location: Back (knees/both and feet) Pain Orientation: Mid, Lower Pain Descriptors / Indicators: Aching, Sharp, Shooting Pain Onset: More than a month  ago Pain Frequency: Constant Pain Relieving Factors: medication;rest, heat ice Effect of Pain on Daily Activities: limits mobiltiy  Pain Relieving Factors: medication;rest, heat ice  BMI - recorded: 26.94 Nutritional Status: BMI 25 -29 Overweight Nutritional Risks: None Diabetes: No  Lab Results  Component Value Date   HGBA1C 5.8 (H) 09/05/2023   HGBA1C 5.8 (H) 01/29/2023   HGBA1C 5.5 07/11/2022     How often do you need to have someone help you when you read instructions, pamphlets, or other written materials from your doctor or pharmacy?: 1 - Never  Interpreter Needed?: No  Comments: lives alone Information entered by :: B.Jaylani Mcguinn,LPN   Activities of Daily Living     03/27/2024    9:52 AM  In your present state of health, do you have any difficulty performing the following activities:  Hearing? 1  Vision? 0  Difficulty concentrating or making decisions? 1  Walking or climbing stairs? 1  Dressing or bathing? 0  Doing errands, shopping? 1  Preparing Food and eating ? N  Using the Toilet? N  In the past six months, have you accidently leaked urine? N  Do you have problems with loss of bowel control? Y  Comment a few accidents due to IBS  Managing your Medications? N  Managing your Finances? N  Housekeeping or managing your Housekeeping? Y    Patient Care Team: Corwin Antu, FNP as PCP - General (Family Medicine) Jeffrie Oneil BROCKS, MD as PCP - Cardiology (Cardiology) Copland, Harlene BROCKS, MD as Referring Physician (Family Medicine) Bobbette Hercules, MD as Rounding Team (Internal Medicine) Skeet Juliene SAUNDERS, DO as Consulting Physician (Neurology) [place], Heather, OD (Optometry)  I have updated your Care Teams any recent Medical Services you may have received from other providers in the past year.     Assessment:   This is a routine wellness examination for Sharon [name].  Hearing/Vision screen Hearing Screening - Comments:: Patient denies any hearing difficulties.  Rt  ear a less hearing and echoes;lft ear ok Vision Screening - Comments:: Pt says their vision is good with glasses Dr  [place]   Goals Addressed             This Visit's Progress    DIET - INCREASE WATER INTAKE       03/27/24 -continue       Depression Screen     03/27/2024    9:44 AM 11/26/2023    2:33 PM 10/09/2023    3:52 PM 08/28/2023    1:05 PM 04/30/2023   12:20 PM 04/10/2023   12:54 PM 03/27/2023    2:11 PM  PHQ 2/9 Scores  PHQ - 2 Score 2 0 3 0 3 2 0  PHQ- 9 Score 6  7  6 6      Fall Risk     03/27/2024    9:33 AM 11/26/2023    2:33 PM 10/09/2023    3:52 PM 08/28/2023    1:05 PM 07/11/2023    1:51 PM  Fall Risk   Falls in the past year? 0 0 0 0 0  Number falls in past yr: 0 0 0 0 0  Injury with Fall? 0 0 0 0 0  Risk for fall due to : Orthopedic patient;Impaired balance/gait;Impaired mobility  No Fall Risks    Follow up Education provided;Falls prevention discussed  Falls evaluation completed  Falls evaluation completed    MEDICARE RISK AT HOME:  Medicare Risk at Home Any stairs in or around the home?: Yes (2-3 steps coming in home) If so, are there any without handrails?: No Home free of loose throw rugs in walkways, pet beds, electrical cords, etc?: Yes Adequate lighting in your home to reduce risk of falls?: Yes Life alert?: No Use of a cane, walker or w/c?: Yes Grab bars in the bathroom?: No Shower chair or bench in shower?: Yes Elevated toilet seat or a handicapped toilet?: Yes  TIMED UP AND GO:  Was the test performed?  No  Cognitive Function: 6CIT completed        03/27/2024    9:54 AM 03/27/2023    2:13 PM  6CIT Screen  What Year? 0 points 0 points  What month? 0 points 0 points  What time? 0 points 0 points  Count back from 20 0 points 0 points  Months in reverse 0 points 0 points  Repeat phrase 0 points 0 points  Total Score 0 points 0 points    Immunizations Immunization History  Administered Date(s) Administered   Influenza Whole  03/19/2010   Influenza,inj,Quad PF,6+ Mos 03/25/2015, 04/13/2021   Influenza-Unspecified 03/19/2018   Moderna Sars-Covid-2 Vaccination 06/21/2019, 07/19/2019, 08/06/2020   Tdap 11/08/2016    Screening Tests Health Maintenance  Topic Date Due   Pneumococcal Vaccine: 50+ Years (1 of 2 - PCV) Never done   Hepatitis B Vaccines 19-59 Average Risk (1 of 3 - 19+ 3-dose series) Never done   Zoster Vaccines- Shingrix (1 of 2) Never done   Influenza Vaccine  01/18/2024   COVID-19 Vaccine (4 - 2025-26 season) 02/18/2024   Mammogram  11/27/2024   Medicare Annual Wellness (AWV)  03/27/2025   Colonoscopy  01/06/2026   DTaP/Tdap/Td (2 - Td or Tdap) 11/09/2026   Hepatitis C Screening  Completed   HIV Screening  Completed   HPV VACCINES  Aged Out   Meningococcal B Vaccine  Aged Out    Health Maintenance Items Addressed: Pt says she will receive vaccines at their pharmcy pr PCP visit when decided to obtain   Additional Screening:  Vision Screening: Recommended annual ophthalmology exams for early detection of glaucoma and other disorders of the eye. Is the patient up to date with their annual eye exam?  Yes  Who is the provider or what is the name of the office in which the patient attends annual eye exams? Dr [place]  Dental Screening: Recommended annual dental exams for proper oral hygiene  Community Resource Referral / Chronic Care Management: CRR required this visit?  No   CCM required this visit?  Appt scheduled with PCP   Plan:    I have personally reviewed and noted the following in the patient's chart:   Medical and social history Use of alcohol, tobacco or illicit drugs  Current medications and supplements including opioid prescriptions. Patient is not currently taking opioid prescriptions. Functional ability and status Nutritional status Physical activity Advanced directives List of other physicians Hospitalizations, surgeries, and ER visits in previous 12  months Vitals Screenings to include cognitive, depression, and falls Referrals and appointments  In addition, I have reviewed and discussed with patient certain preventive protocols, quality metrics, and best practice recommendations. A written personalized care plan for preventive services as well as general preventive health recommendations were provided to patient.   Erminio LITTIE Saris, LPN   [id number]   After Visit Summary: (MyChart) Due to this being a telephonic visit, the after visit summary with patients personalized plan was offered to patient via MyChart   Notes: Nothing significant to report at this time.

## 2024-04-01 ENCOUNTER — Encounter: Payer: Self-pay | Admitting: *Deleted

## 2024-04-01 ENCOUNTER — Encounter: Payer: Self-pay | Admitting: Family

## 2024-04-01 ENCOUNTER — Ambulatory Visit (INDEPENDENT_AMBULATORY_CARE_PROVIDER_SITE_OTHER): Admitting: Family

## 2024-04-01 VITALS — BP 124/67 | HR 77 | Temp 99.0°F | Ht 67.0 in | Wt 162.0 lb

## 2024-04-01 DIAGNOSIS — F339 Major depressive disorder, recurrent, unspecified: Secondary | ICD-10-CM

## 2024-04-01 DIAGNOSIS — R5383 Other fatigue: Secondary | ICD-10-CM | POA: Diagnosis not present

## 2024-04-01 DIAGNOSIS — D519 Vitamin B12 deficiency anemia, unspecified: Secondary | ICD-10-CM

## 2024-04-01 DIAGNOSIS — F411 Generalized anxiety disorder: Secondary | ICD-10-CM

## 2024-04-01 DIAGNOSIS — R7303 Prediabetes: Secondary | ICD-10-CM | POA: Diagnosis not present

## 2024-04-01 DIAGNOSIS — I10 Essential (primary) hypertension: Secondary | ICD-10-CM | POA: Diagnosis not present

## 2024-04-01 DIAGNOSIS — I739 Peripheral vascular disease, unspecified: Secondary | ICD-10-CM

## 2024-04-01 DIAGNOSIS — K219 Gastro-esophageal reflux disease without esophagitis: Secondary | ICD-10-CM

## 2024-04-01 DIAGNOSIS — B353 Tinea pedis: Secondary | ICD-10-CM

## 2024-04-01 LAB — BASIC METABOLIC PANEL WITH GFR
BUN: 13 mg/dL (ref 6–23)
CO2: 32 meq/L (ref 19–32)
Calcium: 9.6 mg/dL (ref 8.4–10.5)
Chloride: 103 meq/L (ref 96–112)
Creatinine, Ser: 0.73 mg/dL (ref 0.40–1.20)
GFR: 89.91 mL/min (ref >60.00)
Glucose, Bld: 92 mg/dL (ref 70–99)
Potassium: 4.7 meq/L (ref 3.5–5.1)
Sodium: 141 meq/L (ref 135–145)

## 2024-04-01 LAB — CBC
HCT: 39.1 % (ref 36.0–46.0)
Hemoglobin: 13.2 g/dL (ref 12.0–15.0)
MCHC: 33.8 g/dL (ref 30.0–36.0)
MCV: 86.7 fl (ref 78.0–100.0)
Platelets: 284 K/uL (ref 150.0–400.0)
RBC: 4.51 Mil/uL (ref 3.87–5.11)
RDW: 13.8 % (ref 11.5–15.5)
WBC: 5.1 K/uL (ref 4.0–10.5)

## 2024-04-01 LAB — VITAMIN B12: Vitamin B-12: 285 pg/mL (ref 211–911)

## 2024-04-01 LAB — TSH: TSH: 1.37 u[IU]/mL (ref 0.35–5.50)

## 2024-04-01 LAB — HEMOGLOBIN A1C: Hgb A1c MFr Bld: 5.8 % (ref 4.6–6.5)

## 2024-04-01 MED ORDER — DULOXETINE HCL 20 MG PO CPEP: 20.0000 mg | ORAL_CAPSULE | Freq: Every day | ORAL | 1 refills | Status: DC

## 2024-04-01 MED ORDER — PANTOPRAZOLE SODIUM 40 MG PO TBEC: 40.0000 mg | DELAYED_RELEASE_TABLET | Freq: Every day | ORAL | 1 refills | Status: DC | PRN

## 2024-04-01 MED ORDER — NEBIVOLOL HCL 5 MG PO TABS: 5.0000 mg | ORAL_TABLET | Freq: Every day | ORAL | 4 refills | Status: AC

## 2024-04-01 NOTE — Progress Notes (Signed)
 "  Established Patient Office Visit  Subjective:      CC:  Chief Complaint  Patient presents with   Medical Management of Chronic Issues    Would like lab checked as well as follow up on everything. Needs refills     HPI: Sharon [name] is a 58 y.o. female presenting on 04/01/2024 for Medical Management of Chronic Issues (Would like lab checked as well as follow up on everything. Needs refills ) .  Discussed the use of AI scribe software for clinical note transcription with the patient, who gave verbal consent to proceed.  History of Present Illness Sharon [name] is a 58 year old female who presents for a normal follow-up visit.  She experiences chronic pain in her legs and back, persisting for several months. She has previously engaged in physical therapy and is scheduled to see her pain management doctor on the 29th. Her neck pain has subsided. She is concerned about medication side effects.  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 She reports intermittent numbness in her jaw, which she associates with neck issues. She uses a CPAP machine and has adjusted it to avoid tightness, but the numbness persists intermittently.  She reports swelling and a sensation of fullness in her toes, which she has not yet discussed with her neurologist. She has a history of tremors and is concerned about her memory issues, noting that her neurologist has not performed a brain scan in six years.  She experiences pain in her left calf, which she attributes to muscle use or lack thereof. She has considered requesting an ultrasound to check for blood clots, as it has been two years since her last one. She has a history of blood clots in her lungs.  She describes joint pain and stiffness, particularly in her feet, and has been diagnosed with arthritis. She has previously been evaluated for autoimmune diseases, with normal inflammatory  markers.  She is dealing with significant stress related to family responsibilities, particularly caring for her aunt Gwenn, who requires frequent medical appointments. This has impacted her ability to manage her own health and household tasks.  Lab Results  Component Value Date   VITAMINB12 553 03/28/2022           Social history:  Relevant past medical, surgical, family and social history reviewed and updated as indicated. Interim medical history since our last visit reviewed.  Allergies and medications reviewed and updated.  DATA REVIEWED: CHART IN EPIC     ROS: Negative unless specifically indicated above in HPI.    Current Outpatient Medications:    acyclovir  (ZOVIRAX ) 400 MG tablet, Take 1 tablet by mouth daily., Disp: , Rfl:    albuterol  (VENTOLIN  HFA) 108 (90 Base) MCG/ACT inhaler, INHALE 1-2 PUFFS BY MOUTH EVERY 6 HOURS AS NEEDED FOR WHEEZE OR SHORTNESS OF BREATH, Disp: 8.5 each, Rfl: 2   benzonatate  (TESSALON ) 100 MG capsule, Take 1 capsule (100 mg total) by mouth 3 (three) times daily as needed for cough., Disp: 30 capsule, Rfl: 1   bimatoprost (LUMIGAN) 0.03 % ophthalmic solution, Place 1 drop into both eyes at bedtime., Disp: , Rfl:    Cholecalciferol  (VITAMIN D ) 50 MCG (2000 UT) tablet, 1 tablet, Disp: , Rfl:    clobetasol  (TEMOVATE ) 0.05 % external solution, APPLY TO AFFECTED AREA TWICE A DAY, Disp: 50 mL, Rfl: 0   desonide  (DESOWEN ) 0.05 % cream, Apply topically 2 (two) times daily.,  Disp: 30 g, Rfl: 0   DULoxetine  (CYMBALTA ) 20 MG capsule, Take 1 capsule (20 mg total) by mouth daily., Disp: 30 capsule, Rfl: 1   EPINEPHrine  0.3 mg/0.3 mL IJ SOAJ injection, Inject 0.3 mg into the muscle as needed for anaphylaxis., Disp: 1 each, Rfl: 0   estradiol  (ESTRACE ) 0.1 MG/GM vaginal cream, Insert pea-sized amount nightly for 2 weeks then every other night, Disp: , Rfl:    famotidine  (PEPCID ) 40 MG tablet, TAKE 1 TABLET BY MOUTH EVERY DAY, Disp: 90 tablet, Rfl: 1    gabapentin  (NEURONTIN ) 100 MG capsule, Take 2 capsules (200 mg total) by mouth 3 (three) times daily as needed., Disp: 270 capsule, Rfl: 3   gabapentin  (NEURONTIN ) 600 MG tablet, Take 2 tablets (1,200 mg total) by mouth 3 (three) times daily., Disp: 270 tablet, Rfl: 3   lidocaine  (LIDODERM ) 5 %, Apply 1 patch topically daily., Disp: , Rfl:    LUMIGAN 0.01 % SOLN, 1 drop at bedtime., Disp: , Rfl:    methocarbamol  (ROBAXIN ) 500 MG tablet, Take 1 tablet (500 mg total) by mouth every 8 (eight) hours as needed for muscle spasms., Disp: 270 tablet, Rfl: 3   mometasone  (NASONEX ) 50 MCG/ACT nasal spray, Place 2 sprays into the nose daily., Disp: 1 each, Rfl: 12   nitroGLYCERIN  (NITROSTAT ) 0.4 MG SL tablet, Place 1 tablet (0.4 mg total) under the tongue every 5 (five) minutes as needed for chest pain., Disp: 30 tablet, Rfl: 12   Nitroglycerin  (RECTIV ) 0.4 % OINT, Place 0.4 inches rectally 2 (two) times daily as needed (For anal fissures.)., Disp: 30 g, Rfl: 1   pantoprazole  (PROTONIX ) 20 MG tablet, TAKE 1 TABLET BY MOUTH EVERY DAY, Disp: 90 tablet, Rfl: 3   pantoprazole  (PROTONIX ) 40 MG tablet, TAKE 1 TABLET (40 MG TOTAL) BY MOUTH TWICE A DAY BEFORE MEALS, Disp: 180 tablet, Rfl: 1   polyethylene glycol powder (GLYCOLAX /MIRALAX ) 17 GM/SCOOP powder, See admin instructions., Disp: , Rfl:    primidone  (MYSOLINE ) 50 MG tablet, Take 1 tablet in morning and 3 tablets at bedtime., Disp: 360 tablet, Rfl: 3   traZODone  (DESYREL ) 50 MG tablet, Take 1 tablet (50 mg total) by mouth at bedtime as needed for sleep., Disp: 90 tablet, Rfl: 3   triamcinolone  cream (KENALOG ) 0.1 %, Apply to affected areas hands twice daily until improved, then as needed for recurrence. Avoid applying to face, groin, and axilla. Use as directed. Long-term use can cause thinning of the skin., Disp: 45 g, Rfl: 3   vitamin B-12 (CYANOCOBALAMIN ) 1000 MCG tablet, Take 1 tablet (1,000 mcg total) by mouth daily., Disp: 30 tablet, Rfl: 3   Zinc  50 MG  TABS, 1 tablet Orally Once a day for 30 day(s), Disp: , Rfl:    nebivolol  (BYSTOLIC ) 5 MG tablet, Take 1 tablet (5 mg total) by mouth daily., Disp: 90 tablet, Rfl: 4        Objective:        BP 124/67   Pulse 77   Temp 99 F (37.2 C) (Oral)   Ht 5' 7 (1.702 m)   Wt 162 lb (73.5 kg)   LMP 10/11/2010   SpO2 96%   BMI 25.37 kg/m   Physical Exam   Wt Readings from Last 3 Encounters:  04/01/24 162 lb (73.5 kg)  03/27/24 172 lb (78 kg)  11/26/23 172 lb (78 kg)    Physical Exam Vitals reviewed.  Constitutional:      General: She is not in acute distress.  Appearance: Normal appearance. She is normal weight. She is not ill-appearing, toxic-appearing or diaphoretic.  HENT:     Head: Normocephalic.  Cardiovascular:     Rate and Rhythm: Normal rate and regular rhythm.  Pulmonary:     Effort: Pulmonary effort is normal.  Musculoskeletal:        General: Normal range of motion.  Neurological:     General: No focal deficit present.     Mental Status: She is alert and oriented to person, place, and time. Mental status is at baseline.  Psychiatric:        Mood and Affect: Mood normal.        Behavior: Behavior normal.        Thought Content: Thought content normal.        Judgment: Judgment normal.    Title   Diabetic Foot Exam - detailed Is there a history of foot ulcer?: No Is there a foot ulcer now?: No Is there swelling?: No Is there elevated skin temperature?: No Is there abnormal foot shape?: No Is there a claw toe deformity?: No Are the toenails long?: No Are the toenails thick?: No Are the toenails ingrown?: No Is the skin thin, fragile, shiny and hairless?: No Normal Range of Motion?: Yes Is there foot or ankle muscle weakness?: No Do you have pain in calf while walking?: Yes (Comment: left calf only) Are the shoes appropriate in style and fit?: Yes Can the patient see the bottom of their feet?: Yes Pulse Foot Exam completed.: Yes   Right  Posterior Tibialis: Present Left posterior Tibialis: Present   Right Dorsalis Pedis: Present Left Dorsalis Pedis: Present     Semmes-Weinstein Monofilament Test + means has sensation and - means no sensation  R Foot Test Control: Pos L Foot Test Control: Pos   R Site 1-Great Toe: Pos L Site 1-Great Toe: Pos   R Site 4: Pos L Site 4: Pos   R site 5: Pos L Site 5: Pos  R Site 6: Pos L Site 6: Pos     Image components are not supported.   Image components are not supported. Image components are not supported.  [place] Comments          Results   Assessment & Plan:   Assessment and Plan Assessment & Plan Chronic joint and back pain with lower extremity symptoms Chronic joint and back pain with associated lower extremity symptoms, including numbness and swelling in toes. Symptoms suggestive of sciatica and iliotibial band syndrome. Deep vein thrombosis raises concern for circulatory issues, but chronicity of symptoms makes acute DVT less likely. - Prescribe duloxetine  for pain management and potential mood improvement. - Discuss potential side effects of duloxetine , including nausea, headache, and tiredness, which should subside within a week. - Consider Ankle Brachial Index (ABI) test to evaluate for peripheral arterial disease if symptoms persist. - Encourage light strength exercises and use of exercise bike to improve mobility and pain management.  Arthritis of the feet Arthritis in feet confirmed by previous x-rays. Symptoms include joint pain and stiffness. - Encourage use of duloxetine  for potential relief of arthritic pain.  Peripheral neuropathy, unspecified Intermittent numbness and swelling in toes, possibly related to peripheral neuropathy or circulatory issues. Symptoms may also be related to sciatica or other nerve compression issues. - Consider Ankle Brachial Index (ABI) test to evaluate for peripheral arterial disease if symptoms persist. - Discuss  with neurology if symptoms worsen.  Depression and anxiety Depression and anxiety contributing  to overall health and quality of life. Symptoms may be exacerbated by chronic pain and life stressors. Duloxetine  may help with both depression and anxiety symptoms. - Prescribe duloxetine , which may help with both depression and anxiety symptoms. - Discuss that duloxetine  may take 4-6 weeks to show improvement in mood symptoms.  Onychomycosis (toenail fungus) Onychomycosis present, likely acquired from pedicures. Previous treatment with ciclopirox  was effective. - Prescribe ciclopirox  (Penlac ) for toenail fungus treatment.  General Health Maintenance Routine health maintenance and screenings are due. - Order comprehensive blood panel to include sugar, liver function, and cancer markers.       Return in about 6 weeks (around 05/13/2024) for f/u depression.     Sharon Patrick, MSN, APRN, FNP-C [facility]     "

## 2024-04-02 ENCOUNTER — Ambulatory Visit: Payer: Self-pay | Admitting: Family

## 2024-04-02 MED ORDER — CICLOPIROX 8 % EX SOLN: Freq: Every day | CUTANEOUS | 0 refills | Status: AC

## 2024-04-14 ENCOUNTER — Encounter: Attending: Physical Medicine and Rehabilitation | Admitting: Physical Medicine and Rehabilitation

## 2024-04-14 ENCOUNTER — Encounter: Payer: Self-pay | Admitting: Physical Medicine and Rehabilitation

## 2024-04-14 VITALS — BP 117/75 | HR 73 | Ht 67.0 in | Wt 160.0 lb

## 2024-04-14 DIAGNOSIS — E663 Overweight: Secondary | ICD-10-CM | POA: Diagnosis not present

## 2024-04-14 DIAGNOSIS — M25561 Pain in right knee: Secondary | ICD-10-CM | POA: Insufficient documentation

## 2024-04-14 DIAGNOSIS — G8929 Other chronic pain: Secondary | ICD-10-CM | POA: Diagnosis not present

## 2024-04-14 DIAGNOSIS — Z7409 Other reduced mobility: Secondary | ICD-10-CM | POA: Diagnosis not present

## 2024-04-14 DIAGNOSIS — M25562 Pain in left knee: Secondary | ICD-10-CM | POA: Diagnosis not present

## 2024-04-14 DIAGNOSIS — F32A Depression, unspecified: Secondary | ICD-10-CM | POA: Diagnosis not present

## 2024-04-14 NOTE — Patient Instructions (Signed)
 Sharon [name] Soothing Bedtime tea

## 2024-04-14 NOTE — Progress Notes (Signed)
 Subjective:    Patient ID: Sharon [name], female    DOB: [birth year], 58 y.o.   MRN: [id number]  HPI: Sharon [name] is a 58 year old woman who presents for f/u insomnia, bilateral knee pain following left knee replacement in 04/2019, transaminitis, shortness of breath, brain fog, and prediabetes.   1) Bilateral knee pain L>R: -discussed that she has been going to church and she is not sure if this helps her pain -cymbalta  helps -PT is going well- she did it for almost 5 months! -grandkids are doing well- she has been helping with them -her boy friend is going through depression -her pain was aggravated by her recent [facility] trip because of the loading and the unloading -needs refill of gabapentin , meloxicam , robaxin  -she is interested in seeing Dr. Genelle for a second opinion after her vacation in June -right knee pain is getting worse -taking meloxicam  as needed, she took it for three weeks and it caused breathing issues.  -has not tried tens unit before, her boy friend has one -pain was worse after changing her bed sheets -getting a new bike and she is hoping that strengthening her muscles will help -went to church a couple times but it hurt so badly. -right knee pain has been getting worse -Her ROM is still restricted in her left knee.  -ice helps -radiates between hip and left knee Feels like a charley horse -She asks whether the scar tissue will ever heal.  -Cannot sleep without a pillow underneath her legs.  -She has follow-up with Dr. Liam and Dr. Vernetta -Currently is not planning to pursue surgery.  -She has been off hydrocodone .  -She would be interested in increasing the Gabapentin  dose.  -She would like to discuss an option for inflammation. She has tried aspirations but the fluid comes back right away.  -She got a  Steroid injection and experienced worsening range of motion in her left knee- that was from Dr. Liam. He also pulled fluid off. It does help  with pain, but she does not want another given her worsening range of motion afterward. She does have improved sensation in her knee after the injection! -has been hurting -discussed Qutenza  as a treatment option -her current regimen is working for her.  -has been having digestive issues and stopped meloxicam .  -pain has been severe lately -she has decreased her robaxin  to daily but this has not improved her dry mouth  2) Anxiety: This has much improved with the Gabapentin . 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  -she has used blue emu oil and diclofenac  gel without great benefit -has appointment with physician today.  -she does use her hands a lot.  -she has been dropping things recently.   9) lower back pain -PT is working on lower back as well -has gotten worse -got Xrs and was shown to have cyst on right side and bulging disc on left side and pinched nerve in the middle.  -she feels a constant burning -worst  on left side -feels she is walking crooked -she had an injection by Dr. Cyrena at orthopedics but these did not help.  -she know has a bone further up that is bothering her.  -left pain in her boy friend's car so had a hard time getting in today -hurts severely sometimes -pinches when she walks -sitting also hurts -she does not have insurance right now -ice helps -she uses heat for the muscular pain -hurt it while bending and turning -has been worse since she carried her grandchild -she is unable to vacuum.   -discussed therapy and using lidocaine  patch, massage.  -she is ok using gabapentin  and her muscle relaxers.  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  -ready to try Qutenza  today -has been severe recently -plans to follow-up with her orthopedic surgeon.   10) Constipation -has been needing to take laxatives.   11) shortness of breath -worried abut blot clots -she asks about results of her vascular ultrasound of her lower extremity and what treatment is necessary, how she would know if there are clots elsewhere in her body, and how she may have developed these clots.  -she has being set up for tests  12) impaired memory -has improved since stopping klonopin /reduced added sugars in diet  13) Prediabetes -she asks about HgbA1c test result  14) Transaminitis -she asks whether any of her medications could have caused this'  15) Food allergy  -she asks about her food allergy  results  16) Insomnia: -asks if there is something else she can try for her insomnia -she has been trying to wean off her Klonopin  but this makes it harder for her to fall asleep -the Requip  caused dry mouth and she is hesitant about its potential side effects -she has not tried tart cherry juice or melatonin  17) Chest pain -has been radiating to left arm -she follows with a cardiologist Prior history:  Since last visit she has stopped Norco as it caused respiratory distress. We tried Cymbalta  40mg  without benefit or side effects. She has tried voltaren  gel, blue emu oil without benefit. She has tried CBD.   18) Depression: cymbalta  helps   She has been trying to improve quadriceps musculature. She discusses some of the exercises she does.   She has been denied in her disability. She has talked to an attorney.  She continues to ambulate with a cane.   She has tried the compounding cream and that does not help much. She has been trying Hemp cream which is helping.  She is  getting foot insoles from the podiatrist. They should be ready in a week or two.   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   Can ride the stationary bicycle and that does not hurt. She sues it every day. She uses leg weights. When   Scar tissue grew back quickly post-surgery. She could not get PT appointments for 3 weeks.   Her surgeon does not want her to break the hardware and he may have to do arthroscopic procedure to flush out the inflammation.   She has tried ice which stopped helping, nabumetone  750 mg BID, Norco 1-2 tablets three times per day PRN (she has 12 left). She uses diclofenac  gel   She takes Miralax  for constipation. She takes up to 2 times per day. She has a history of IBS.  [facility] in [place]- she was a scientist, physiological. They let her go because she did not return in 12 weeks.   She was doing pool therapy but chemicals in the pool were increased because of COVID.  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 Notes reviewed- recently had a right subacromial injection.   Current pain is 8/10.   12) Hip pain -pain is best in the water -PT is going well -they are working on her steps- she is ok with small steps, but not big ones -she is able to squat a little -muscles are building  13) Stress: -it was stressful for her to help plan her daughter's baby shower  14) Anxiety: -her boy friend is talking about leaving again -she has been having a lot of anxiety about this  15) Fatigue -PT has been going well   Pain Inventory Average Pain 8 Pain Right Now 8 My pain is intermittent, sharp, burning, dull, stabbing, tingling, and aching  In the last 24 hours, has pain interfered with the following? General activity 8 Relation with others 8 Enjoyment of life 8 What TIME of day is your pain at its worst? daytime, evening Sleep (in general)  Fair  Pain is worse with: walking, bending, sitting, inactivity, and standing, some activities Pain improves with: rest, heat/ice, therapy/exercise, and medication Relief from Meds:8     Family History  Problem Relation Age of Onset   Emphysema Mother        smoker   Allergies Mother    Asthma Mother    Heart disease Mother        AMI as cause of death   COPD Mother    Asthma Father    Heart disease Father        AMI as cause of death   Diabetes Father    Asthma Brother    Heart disease Brother 60       heart failure   Lung cancer Maternal Grandfather        was a smoker   Cancer Maternal Grandfather        lung   Heart disease Paternal Grandmother        AMI   Heart disease Paternal Grandfather        AMI   Colon cancer Neg Hx    Social History   Socioeconomic History   Marital status: Divorced    Spouse name: Not on file   Number of children: 1   Years of education: Not on file   Highest education level: Not on file  Occupational History   Occupation: It Sales Professional: [employer]  Tobacco Use   Smoking status: Never   Smokeless tobacco: Never  Vaping Use   Vaping status: Never Used  Substance and Sexual Activity   Alcohol use: Not Currently    Comment: occ   Drug use: No    Comment: former maryjuana use- quit in 1995   Sexual activity: Yes    Partners: Male    Birth control/protection: Surgical  Other Topics Concern   Not on file  Social History Narrative   Marital status: divorced x 2; dating seriously x 3 years.        Children:  1 daughter (106); no grandchildren      Lives:  With boyfriend.        Employment: works at [employer]      Tobacco: none      Alcohol:  rarely; socially      Exercise: none; has treadmill and elliptical and bikes   Coffee in am / 1/2 of coke in afternoon    High school education      01/29/20   From: the area   Living: alone - but boyfriend living with her [place] (2020) but knew each other in  high school   Work: on disability - working on appeal      Family: one daughter Romualdo Ponto - one grandchild coming in January       Enjoys: relax and watch tv, use to like fishing/boating/rafting, reading, crochet       Exercise: rehab exercises   Diet: not great - limited intake      Safety   Seat belts: Yes    Guns: Yes  and secure   Right handed   One story home   Drinks caffeine   Social Drivers of Corporate Investment Banker Strain: Low Risk  (03/27/2024)   Overall Financial Resource Strain (CARDIA)    Difficulty of Paying Living Expenses: Not hard at all  Food Insecurity: No Food Insecurity (03/27/2024)   Hunger Vital Sign    Worried About Running Out of Food in the Last Year: Never true    Ran Out of Food in the Last Year: Never true  Transportation Needs: No Transportation Needs (03/27/2024)   PRAPARE - Administrator, Civil Service (Medical): No    Lack of Transportation (Non-Medical): No  Physical Activity: Inactive (03/27/2024)   Exercise Vital Sign    Days of Exercise per Week: 0 days    Minutes of Exercise per Session: 0 min  Stress: No Stress Concern Present (03/27/2024)   [employer] of Occupational Health - Occupational Stress Questionnaire    Feeling of Stress: Not at all  Social Connections: Moderately Isolated (03/27/2024)   Social Connection and Isolation Panel    Frequency of Communication with Friends and Family: More than three times a week    Frequency of Social Gatherings with Friends and Family: More than three times a week    Attends Religious Services: More than 4 times per year    Active Member of [employer] or Organizations: No    Attends Engineer, Structural: Never    Marital Status: Divorced   Past Surgical History:  Procedure Laterality Date   ABDOMINAL HYSTERECTOMY     still with both ovaries   BREAST BIOPSY Left 12/19/2022   US  LT BREAST BX W LOC DEV 1ST LESION IMG BX SPEC US  GUIDE 12/19/2022 [facility]    CATARACT EXTRACTION Bilateral 2015   COLONOSCOPY     CYSTOSCOPY W/ URETERAL STENT PLACEMENT Right 05/23/2017   Procedure: CYSTOSCOPY WITH RETROGRADE PYELOGRAM/URETERAL RIGHT STENT PLACEMENT;  Surgeon: Gaston Hamilton, MD;  Location: [facility];  Service: Urology;  Laterality: Right;   CYSTOSCOPY W/ URETERAL STENT PLACEMENT Right 06/04/2017   Procedure: CYSTOSCOPY WITHRIGHT RETROGRADE PYELOGRAMRIGHT /URETEREOSCOPY  STENT PLACEMENT;  Surgeon: Carolee Sherwood JONETTA DOUGLAS, MD;  Location: [facility];  Service: Urology;  Laterality: Right;   ENDOMETRIAL ABLATION     ESOPHAGOGASTRODUODENOSCOPY (EGD) WITH PROPOFOL  N/A 08/16/2022   Procedure: ESOPHAGOGASTRODUODENOSCOPY (EGD) WITH PROPOFOL ;  Surgeon: Unk Corinn Skiff, MD;  Location: [facility];  Service: Gastroenterology;  Laterality: N/A;   EYE SURGERY     HEMORRHOID SURGERY  2011   INCONTINENCE SURGERY     INNER EAR SURGERY     X 2   RECTAL PROLAPSE REPAIR  RETINAL DETACHMENT SURGERY Bilateral 2000   TOTAL KNEE ARTHROPLASTY Left 04/29/2019   Procedure: LEFT TOTAL KNEE ARTHROPLASTY;  Surgeon: Vernetta Lonni GRADE, MD;  Location: [facility];  Service: Orthopedics;  Laterality: Left;   TUBAL LIGATION     VEIN SURGERY Left 04/2010   Past Medical History:  Diagnosis Date   Allergy     Anal fissure    Anemia    borderline   Anxiety    Arthritis    Blood clot in vein    left leg   Detached retina    BOTH SCLERA BUCKLE BOTH EYES   DVT (deep venous thrombosis) (HCC)    left leg   Endometriosis    Family history of adverse reaction to anesthesia    DAUGHTER POST OP PONV   GERD (gastroesophageal reflux disease)    Glaucoma    RIGHT WORST THAN LEFT   Heart murmur    caused by anxiety    History of hemorrhoids    History of kidney stones    Hypertension    IBS (irritable bowel syndrome)    Perforated eardrum, right    SMALL HOLE   PONV (postoperative nausea and vomiting)    Pulmonary embolism (HCC) 6 YRS AGO   Status post  arthroscopy of left knee 08/06/2017   LMP 10/11/2010   Opioid Risk Score:   Fall Risk Score:  `1  Depression screen PHQ 2/9     04/01/2024    1:11 PM 03/27/2024    9:44 AM 11/26/2023    2:33 PM 10/09/2023    3:52 PM 08/28/2023    1:05 PM 04/30/2023   12:20 PM 04/10/2023   12:54 PM  Depression screen PHQ 2/9  Decreased Interest 2 1 0 2 0 2 2  Down, Depressed, Hopeless 1 1 0 1 0 1 0  PHQ - 2 Score 3 2 0 3 0 3 2  Altered sleeping 0 0  0  0 1  Tired, decreased energy 0 2  3  3 3   Change in appetite 0 0  1  0 0  Feeling bad or failure about yourself  0 1  0  0 0  Trouble concentrating 0 1  0  0 0  Moving slowly or fidgety/restless 0 0  0  0 0  Suicidal thoughts 0 0  0  0 0  PHQ-9 Score 3 6  7  6 6   Difficult doing work/chores Very difficult Somewhat difficult  Very difficult   Very difficult   Review of Systems  Constitutional: Negative.        Weight loss  HENT: Negative.   Eyes: Negative.   Respiratory: Positive for shortness of breath.   Cardiovascular: Negative.   Gastrointestinal: Negative.   Endocrine: Negative.   Genitourinary: Negative.   Musculoskeletal: Positive for arthralgias, back pain and gait problem.       Spasms  Skin: Negative.   Allergic/Immunologic: Negative.   Neurological: Positive for tremors and numbness.  Hematological: Negative.   Psychiatric/Behavioral:       Anxiety  All other systems reviewed and are negative.      Objective:   Gen: no distress, normal appearing, BMI 26.16, weight 167 lbsm BP 133/81 HEENT: oral mucosa pink and moist, NCAT Cardio: Reg rate Chest: normal effort, normal rate of breathing Abd: soft, non-distended Ext: no edema Psych: pleasant, normal affect Skin: intact Neuro: Alert and oriented x3 MSK: ambulates with cane, decreased range of motion in bilateral knees, stable 10/27  Assessment &  Plan:  Sharon [name] is a 58 year old woman who presents for follow-up:   1) Left knee pain post-surgery: -discussed 2nd  opinion with Dr. Genelle to see if she has reactivity to any of the metals in her implant -continue gabapentin , meloxicam , robaxin  -continue cymbalta  -discussed that every pound she carries is 6 lbs on her knees, commended on her weight loss! -discussed that knee pain has worsened since she has been weaning off her medications to avoid their potential side effects -discussed that even activities such as changing her bed sheets can aggravate her pain -She has a plan for repeat surgical procedure with Dr. Vernetta that will hopefully provide relief.  -She has been icing regularly- recommended three tines per day for 15 minutes. Discussed that this decreases blood flow and can impair healing but help with swelling and pain relief.  -discussed the limitations of her pain on her function -discussed extracorporeal shockwave therapy and how this can help break apart scar tissue -unable to work due the level of her pain -discussed current impact on her life -continue cane for balance -She has been through physical therapy and performs her exercises diligently. --Discussed Sprint PNS system as an option of pain treatment via neuromodulation. Provided following link for patient to learn more about the system: https://www.sprtherapeutics.com/. She does not like the feeling of things under her skin and defers that option.  -She weaned herself all the steroids.  -Provided with a pain relief journal and discussed that it contains foods and lifestyle tips to naturally help to improve pain. Discussed that these lifestyle strategies are also very good for health unlike some medications which can have negative side effects. Discussed that the act of keeping a journal can be therapeutic and helpful to realize patterns what helps to trigger and alleviate pain.   -discussed mechanism of action of low dose naltrexone as an opioid receptor antagonist which stimulates your body's production of its own natural endogenous  opioids, helping to decrease pain. Discussed that it can also decrease T cell response and thus be helpful in decreasing inflammation, and symptoms of brain fog, fatigue, anxiety, depression, and allergies. Discussed that this medication needs to be compounded at a compounding pharmacy and can more expensive. Discussed that I usually start at 1mg  and if this is not providing enough relief then I titrate upward on a monthly basis.   -Discussed Qutenza  as an option for neuropathic pain control. Discussed that this is a capsaicin  patch, stronger than capsaicin  cream. Discussed that it is currently approved for diabetic peripheral neuropathy and post-herpetic neuralgia, but that it has also shown benefit in treating other forms of neuropathy. Provided patient with link to site to learn more about the patch: https://www.qutenza .com/. Discussed that the patch would be placed in office and benefits usually last 3 months. Discussed that unintended exposure to capsaicin  can cause severe irritation of eyes, mucous membranes, respiratory tract, and skin, but that Qutenza  is a local treatment and does not have the systemic side effects of other nerve medications. Discussed that there may be pain, itching, erythema, and decreased sensory function associated with the application of Qutenza . Side effects usually subside within 1 week. A cold pack of analgesic medications can help with these side effects. Blood pressure can also be increased due to pain associated with administration of the patch.  -continue ice     -continue Robaxin  as she is nervous about feeling too sleepy with the Tizanidine , and the Robaxin  does help.   Continue gabapentin   to 600mg  TID PRN.   -Continue neoprene knee sleeve for proprioception  -She has tried Celebrex  and Tramadol  without benefit. She had respiratory distress with hydrocodone .   -She had worse pain with steroid injection in her left knee.   -Discussed steroids but she did not like  the side effect profile. Discussed the different steroid options. She weaned off these because she developed mouth sores.   -Continue turmeric and cherries.   -Continue Lidocaine  patches today.   -She would like rheum panel checked: ordered, reviewed results which were negative, and discussed with patient.  -Refilled meloxicam .   2) Bunion:  -continue orthotics  3) Irritable bowel syndrome: -She does not eat much sugar or sweets.  -She drinks 1 coke per day.  -She cuts tea out.  -She drinks water, coffee.  -She has been going more regularly and has not needed her Miralax .  -food allergy  testing ordered  4) Impaired Concentration: - This persists -She forgets things easily.  -She tries not to multitask when she was with her grandmother.   5) Anxiety: The Gabapentin  is helping with this as well, continue -discussed plan to start ecitalopram and that she has cleared this with her ophthalmologist -discussed increasing trazodone  -Buspar  prescribed, discussed that she has not yet tried this but plans to -discussed that this has improved since her boy friend moved out  6) Vulvodynia: -discussed various treatment options: biofeedback, medications, continue estradiol .  -continue Amitriptyline  10mg  HS, discussed moving dose earlier in the evening.  -discussed that sex is still painful  7) Cervical and lumbar myofascial pain: -try massage -discussed benefits of therapy and trigger point injections -apply lidocaine  patch.   8) Bilateral hand pain: likely secondary to arthritis compounded by carpal tunnel syndrome -coontinue Gabapentin  which is helping -continue f/u with surgery as planned.   9) Low back pain, appears to be secondary to facet arthritis: -discussed that PT was helpful Continue HEP -discussed XR, but she defers due to potential cost -discussed surgery with an orthopedic and she would like to consider this but her insurance would not cover it.  -discussed lack of  improvement with injections from Dr. Cyrena.  -continue lidocaine  patches, discussed that these make her feel weird when she takes them off.   Continue Ketamine 10%, Baclofen 2%, Cyclobenzaprine 2%, Ketoprofen 10%, Gabapentin  6%, Bupivacaine  1%, Amitryptiline 5%, clonidine 0.2%, continue, discussed this is helpful  Dispense 240 g  Apply 1-2 g to affected area 3-4 times per day  -continue postural correction -Provided with a pain relief journal and discussed that it contains foods and lifestyle tips to naturally help to improve pain. Discussed that these lifestyle strategies are also very good for health unlike some medications which can have negative side effects. Discussed that the act of keeping a journal can be therapeutic and helpful to realize patterns what helps to trigger and alleviate pain.   -Discussed following foods that may reduce pain: 1) Ginger (especially studied for arthritis)- reduce leukotriene production to decrease inflammation 2) Blueberries- high in phytonutrients that decrease inflammation 3) Salmon- marine omega-3s reduce joint swelling and pain 4) Pumpkin seeds- reduce inflammation 5) dark chocolate- reduces inflammation 6) turmeric- reduces inflammation 7) tart cherries - reduce pain and stiffness 8) extra virgin olive oil - its compound olecanthal helps to block prostaglandins  9) chili peppers- can be eaten or applied topically via capsaicin  10) mint- helpful for headache, muscle aches, joint pain, and itching 11) garlic- reduces inflammation  Link to further information on diet for chronic  pain: http://[email or web address]/   10) shoulder pain -used to get shots in the pt, but laying on her back has helped to decrease her shoulder pain  11) Acid reflux -continues famotidine .   11) Soreness in legs -vascular ultrasound ordered of both legs -discussed her history of clots -discussed that a  D-dimer test can suggest clot if elevated.   12) Irritable bowel syndrome -discussed her current diet -discussed that she does not eat breakfast due to nausea -discussed her diarrhea and constipation, more diarrheal type at this time.  -discussed her aunt's advise to have a tsp coconut oil daily.  -recommended bioptemizer's magnesium  breakthrough -reviewed food allergies with her and made goal for 4 week elimination of foods with the highest responses  13) Insomnia/Shortness of breath with history of clots -discussed that insomnia has improved -discussed that there is no evidence of PE on her CTA -discussed her follow-up with PCP -discussed that her vascular ultrasound of her lower extremities shows a chronic left peroneal CVT  -recommended starting nattokinase to help thin the blood and lower her risk for clots, or to eat natto -discussed the importance of movement in clot prevention -referred for sleep study for shortness of breath -encouraged tart cherries or tart cherry juice  14) Impaired memory: -discussed her memory difficulties. -discussed that memory is improving since stopping her klonopin  and decreasing added sugar in her diet -discussed that some studies have linked chronic PPI use to memory impairment -discussed safe way to wean off these medications slowly  15) Hemorroids: Recommended Epsom salt baths  16) prediabetes -advised switching from sweetened black tea to black tea with allulose or honey - recommended avoiding processed foods/added sugars/bread/pasta/rice -recommended drinking a glass of water with either apple cide vinegar or lemon -recommended reading Wheat Belly and Grain Brain -recommended eating fruits, vegetables, yogurt, nuts, olive oil -discussed risks and benefits of metformin -discussed that repeat labs show normalization of HgbA1c! -discussed that she does not eat much anymore -encouraged getting enough protein -discussed that it is hard to  maintain a good diet on the holidays -discussed that HgbA1c is 5.8 -continue PT for exercise  17) Transaminitis -recommended avoiding ultraprocessed foods -recommended avoiding/minimizing tylenol  -recommended supplement NAC 600mg  BID  -recommended drinking dandelion and milk thistle teas -commended on stopping drinking soda products! -recommended avoiding processed creamer in coffee, continue drinking coffee -discussed that repeat CMP shows normalization of liver enzymes! -CMP ordered  18) Bilateral knee pain: -refilled gabapentin  -encouraged use of exercise bike to strengthen quadriceps tendon -continue robaxin , meloxciam -continue PT -discussed tens unit -discussed how the pain limits her ability work  19) Dry mouth: -stop amitriptyline   20) Benzodiazepine withdrawal: -discussed that this should be weaned slowly  31) Restless leg syndrome:  -discussed requip , will prescribe  32) Insomnia: -prescribed trazodone , discussed that this promotes increased deep sleep, discussed that this has helped -recommended tart cherry juice -discussed that she has been on Klonopin  for 12 years and is trying to wean off as her PCP is concerned it may be contributing to her brain fog -discussed that Klonopin  can contribute to memory decline and commended her for trying to wean off -discussed that she has been having dry mouth and she is not sure if it is from the Requip . She has stopped taking this in case and dry mouth has continued -discussed that robaxin  can also cause dry mouth -discussed taking muscle relaxer earlier in the evening so she does not have to wake at night to drink water  33) Chest  pain: -discussed current symptoms -prescribed nitroglycerin   34) Elevated BUN -encouraged 6-8 glasses of water per day -discussed that creatinine is normal  35. Diffuse arthritis: -discussed potential benefits of boswellia -continue compounded cream, discussed potentially alternating with  blue emu oil -discussed aquatherapy -prescribed PT -discussed that cymbalta  has helped  36. Left hip pain: -continue PT  37. Impaired balance: -continue PT -continue to minimize robaxin   38. Fatigue:  -recommended time outdoors   39. Brittle nails: -recommended collagen and 2 Brazil nuts per day -TSH, T3, T4 ordered, discussed that these are normal -discussed zinc  supplement   40. Depression:  -discussed that cymbalta  has helped  41. Overweight:  -discussed that she has lost 12 lbs since her last visit!

## 2024-04-23 ENCOUNTER — Other Ambulatory Visit: Payer: Self-pay | Admitting: Family

## 2024-04-23 DIAGNOSIS — K219 Gastro-esophageal reflux disease without esophagitis: Secondary | ICD-10-CM

## 2024-04-23 DIAGNOSIS — F339 Major depressive disorder, recurrent, unspecified: Secondary | ICD-10-CM

## 2024-04-30 ENCOUNTER — Telehealth (HOSPITAL_COMMUNITY): Payer: Self-pay

## 2024-04-30 NOTE — Telephone Encounter (Signed)
 Attempted to contact the patient to schedule VAS US .  No answer.  Left message.  First Attempt. Provided  direct contact number for scheduling: [phone].  Request History:  Documentation prior to receipt of information by Vascular Ultrasound on 04/30/24   04/01/24 @13 :26 Tabitha Dugal FNP - Request entered to system 04/01/24 @13 :29 Ginger Patrick FNP - Request changed by same provider 04/02/24 @11 :31 Melissa Xayasine - enters notes: order for vascular dept - ordering ofc aware and will change order - per pt preference does not [facility] to [facility] to [facility] No documentation of additional actions taken. 04/29/24 @7 :19 Erica McGonigal -changed order; to match requested study and route to appropriate work queue 04/29/24 @11 :50 Ashtyn Green - Authorization information entered 04/30/24 @9 :45 Erica Mcgonigal - Directly messaged vascular ultrasound admin assist team (Me) to inform about need for study and reasons. 04/30/24 @10 :07 [place] Domique Clapper - begins review of information, and notes the above information. Due to prior delay not related to our office, I immediately attempted contact to the patient and will continue attempts this week to ensure timely scheduling with relation to the original order entry date. I left a message with the patient and provided direct contact information.  Request notes updated, and will follow up as needed to proceed with request and assist patient.   For scheduling please contact the vascular ultrasound team directly: [place] - [phone] Or [place] - [phone]

## 2024-05-06 ENCOUNTER — Ambulatory Visit (HOSPITAL_COMMUNITY)
Admission: RE | Admit: 2024-05-06 | Discharge: 2024-05-06 | Disposition: A | Discharge status: Home / Self Care | Source: Ambulatory Visit | Attending: Family | Admitting: Family

## 2024-05-06 DIAGNOSIS — I739 Peripheral vascular disease, unspecified: Secondary | ICD-10-CM | POA: Diagnosis not present

## 2024-05-06 LAB — VAS US ABI WITH/WO TBI
Left ABI: 1.15
Right ABI: 1.27

## 2024-05-06 NOTE — Progress Notes (Signed)
 ABI came back negative for peripheral arterial disease.

## 2024-05-13 ENCOUNTER — Ambulatory Visit (INDEPENDENT_AMBULATORY_CARE_PROVIDER_SITE_OTHER): Admitting: Family

## 2024-05-13 ENCOUNTER — Encounter: Payer: Self-pay | Admitting: Family

## 2024-05-13 VITALS — BP 120/86 | HR 83 | Temp 98.3°F | Ht 67.0 in | Wt 159.2 lb

## 2024-05-13 DIAGNOSIS — M5412 Radiculopathy, cervical region: Secondary | ICD-10-CM

## 2024-05-13 DIAGNOSIS — R051 Acute cough: Secondary | ICD-10-CM

## 2024-05-13 MED ORDER — PREDNISONE 10 MG (21) PO TBPK: ORAL_TABLET | ORAL | 0 refills | Status: AC

## 2024-05-13 NOTE — Progress Notes (Signed)
 Established Patient Office Visit  Subjective:      CC:  Chief Complaint  Patient presents with   Depression    HPI: Sharon [name] is a 58 y.o. female presenting on 05/13/2024 for Depression .  Discussed the use of AI scribe software for clinical note transcription with the patient, who gave verbal consent to proceed.  History of Present Illness Sharon [name] is a 58 year old female who presents with persistent breathing difficulties.  She has been experiencing persistent breathing difficulties for over a month, which began after contracting an illness from her grandchildren. Initially, she had a cough, sinus congestion, and green-yellow drainage lasting about a week, leaving her feeling drained and tired. Although the acute symptoms resolved, she continues to experience breathing issues, describing a sensation of tightness in her chest and thick, clear mucus. She has not taken any medication for this issue due to concerns about drug interactions but has been using an inhaler without relief.  She experiences shortness of breath, particularly upon waking, despite using a CPAP machine. Her sleep has been disrupted, with only four to six hours of rest per night, which she attributes to a recent change in her medication regimen. She was switched from Klonopin  to trazodone , which she takes at a low dose, and is also on duloxetine , which she believes is still adjusting in her system.  She has a history of chronic joint and back pain, which she manages with medications including gabapentin  and methotrexate. She reports muscle tightness and pain in her neck radiating down her arm, which she attributes to stress and possibly a pinched nerve. She has not seen a spine specialist recently due to insurance issues and dissatisfaction with previous care.  She is currently taking several medications: gabapentin  600-800 mg three times a day, methotrexate as needed, and trazodone  for sleep.  She also uses a CPAP machine for sleep apnea and has been prescribed duloxetine  for anxiety and depression.  Socially, she is experiencing stress due to a recent breakup with her boyfriend, who cited her health issues and family responsibilities as reasons for the split. She continues to care for her grandchildren and her aunt, which adds to her stress levels.  No wheezing but reports a scratchy throat and persistent cough.         Social history:  Relevant past medical, surgical, family and social history reviewed and updated as indicated. Interim medical history since our last visit reviewed.  Allergies and medications reviewed and updated.  DATA REVIEWED: CHART IN EPIC     ROS: Negative unless specifically indicated above in HPI.    Current Outpatient Medications:    acyclovir  (ZOVIRAX ) 400 MG tablet, Take 1 tablet by mouth daily., Disp: , Rfl:    albuterol  (VENTOLIN  HFA) 108 (90 Base) MCG/ACT inhaler, INHALE 1-2 PUFFS BY MOUTH EVERY 6 HOURS AS NEEDED FOR WHEEZE OR SHORTNESS OF BREATH, Disp: 8.5 each, Rfl: 2   bimatoprost (LUMIGAN) 0.03 % ophthalmic solution, Place 1 drop into both eyes at bedtime., Disp: , Rfl:    Cholecalciferol  (VITAMIN D ) 50 MCG (2000 UT) tablet, 1 tablet, Disp: , Rfl:    ciclopirox  (PENLAC ) 8 % solution, Apply topically at bedtime. Apply over nail and surrounding skin. Apply daily over previous coat. After seven (7) days, may remove with alcohol and continue cycle., Disp: 6.6 mL, Rfl: 0   clobetasol  (TEMOVATE ) 0.05 % external solution, APPLY TO AFFECTED AREA TWICE A DAY, Disp: 50 mL, Rfl: 0   desonide  (  DESOWEN ) 0.05 % cream, Apply topically 2 (two) times daily., Disp: 30 g, Rfl: 0   DULoxetine  (CYMBALTA ) 20 MG capsule, TAKE 1 CAPSULE BY MOUTH EVERY DAY, Disp: 90 capsule, Rfl: 1   EPINEPHrine  0.3 mg/0.3 mL IJ SOAJ injection, Inject 0.3 mg into the muscle as needed for anaphylaxis., Disp: 1 each, Rfl: 0   estradiol  (ESTRACE ) 0.1 MG/GM vaginal cream, Insert  pea-sized amount nightly for 2 weeks then every other night, Disp: , Rfl:    famotidine  (PEPCID ) 40 MG tablet, TAKE 1 TABLET BY MOUTH EVERY DAY, Disp: 90 tablet, Rfl: 1   gabapentin  (NEURONTIN ) 100 MG capsule, Take 2 capsules (200 mg total) by mouth 3 (three) times daily as needed., Disp: 270 capsule, Rfl: 3   gabapentin  (NEURONTIN ) 600 MG tablet, Take 2 tablets (1,200 mg total) by mouth 3 (three) times daily., Disp: 270 tablet, Rfl: 3   lidocaine  (LIDODERM ) 5 %, Apply 1 patch topically daily., Disp: , Rfl:    LUMIGAN 0.01 % SOLN, 1 drop at bedtime., Disp: , Rfl:    methocarbamol  (ROBAXIN ) 500 MG tablet, Take 1 tablet (500 mg total) by mouth every 8 (eight) hours as needed for muscle spasms., Disp: 270 tablet, Rfl: 3   mometasone  (NASONEX ) 50 MCG/ACT nasal spray, Place 2 sprays into the nose daily., Disp: 1 each, Rfl: 12   nebivolol  (BYSTOLIC ) 5 MG tablet, Take 1 tablet (5 mg total) by mouth daily., Disp: 90 tablet, Rfl: 4   nitroGLYCERIN  (NITROSTAT ) 0.4 MG SL tablet, Place 1 tablet (0.4 mg total) under the tongue every 5 (five) minutes as needed for chest pain., Disp: 30 tablet, Rfl: 12   Nitroglycerin  (RECTIV ) 0.4 % OINT, Place 0.4 inches rectally 2 (two) times daily as needed (For anal fissures.)., Disp: 30 g, Rfl: 1   pantoprazole  (PROTONIX ) 20 MG tablet, TAKE 1 TABLET BY MOUTH EVERY DAY, Disp: 90 tablet, Rfl: 3   pantoprazole  (PROTONIX ) 40 MG tablet, TAKE 1 TABLET BY MOUTH DAILY AS NEEDED, Disp: 90 tablet, Rfl: 1   polyethylene glycol powder (GLYCOLAX /MIRALAX ) 17 GM/SCOOP powder, See admin instructions., Disp: , Rfl:    predniSONE  (STERAPRED UNI-PAK 21 TAB) 10 MG (21) TBPK tablet, Take as directed, Disp: 1 each, Rfl: 0   primidone  (MYSOLINE ) 50 MG tablet, Take 1 tablet in morning and 3 tablets at bedtime., Disp: 360 tablet, Rfl: 3   traZODone  (DESYREL ) 50 MG tablet, Take 1 tablet (50 mg total) by mouth at bedtime as needed for sleep., Disp: 90 tablet, Rfl: 3   triamcinolone  cream (KENALOG ) 0.1  %, Apply to affected areas hands twice daily until improved, then as needed for recurrence. Avoid applying to face, groin, and axilla. Use as directed. Long-term use can cause thinning of the skin., Disp: 45 g, Rfl: 3   vitamin B-12 (CYANOCOBALAMIN ) 1000 MCG tablet, Take 1 tablet (1,000 mcg total) by mouth daily., Disp: 30 tablet, Rfl: 3   Zinc  50 MG TABS, 1 tablet Orally Once a day for 30 day(s), Disp: , Rfl:         Objective:        BP 120/86 (BP Location: Left Arm, Patient Position: Sitting, Cuff Size: Normal)   Pulse 83   Temp 98.3 F (36.8 C) (Temporal)   Ht 5' 7 (1.702 m)   Wt 159 lb 3.2 oz (72.2 kg)   LMP 10/11/2010   SpO2 95%   BMI 24.93 kg/m   Physical Exam CHEST: Lungs clear to auscultation bilaterally. MUSCULOSKELETAL: No spinal tenderness. Neck tightness on  the right side. No pain or radicular pain on neck compression. Grip strength normal bilaterally.  Wt Readings from Last 3 Encounters:  05/13/24 159 lb 3.2 oz (72.2 kg)  04/14/24 160 lb (72.6 kg)  04/01/24 162 lb (73.5 kg)    Physical Exam Constitutional:      General: She is not in acute distress.    Appearance: Normal appearance. She is normal weight. She is not ill-appearing, toxic-appearing or diaphoretic.  HENT:     Head: Normocephalic.  Cardiovascular:     Rate and Rhythm: Normal rate and regular rhythm.  Pulmonary:     Effort: Pulmonary effort is normal.     Breath sounds: Normal breath sounds.  Musculoskeletal:        General: Normal range of motion.     Right lower leg: No edema.     Left lower leg: No edema.  Neurological:     General: No focal deficit present.     Mental Status: She is alert and oriented to person, place, and time. Mental status is at baseline.  Psychiatric:        Mood and Affect: Mood normal.        Behavior: Behavior normal.        Thought Content: Thought content normal.        Judgment: Judgment normal.          Results   Assessment & Plan:    Assessment and Plan Assessment & Plan Chronic cough with chest tightness and shortness of breath Persistent cough with chest tightness and shortness of breath for over a month, following an upper respiratory infection with green-yellow drainage and chest congestion. Current symptoms include clear but thick chest secretions and a scratchy throat. No wheezing detected. No signs of pneumonia on lung examination. - Prescribed prednisone  to reduce inflammation and alleviate cough. - Recommended Mucinex for chest congestion. - Advised consistent use of heat and massage for chest tightness.  Cervical radiculopathy Neck pain with limited range of motion, radiating down the arm into the fingers, suggestive of cervical radiculopathy. Symptoms have persisted for a couple of months. Possible pinched nerve in the neck. No spinal tenderness on examination. - Advised consistent use of muscle relaxers for a few days. - Recommended heat application and self-massage for neck tightness. - Suggested use of a massage gun or massager for relief.  Chronic joint and back pain with lower extremity symptoms Chronic joint and back pain with lower extremity symptoms, exacerbated by activities such as carrying and vacuuming. Pain management includes gabapentin  and methotrexate as needed. No recent spine specialist consultation due to insurance issues. - Continue current pain management regimen with gabapentin  and methotrexate as needed.  Insomnia Difficulty sleeping, with reduced sleep duration to 4-6 hours per night. Previously on Klonopin , now on trazodone , which provides some relief. Sleep quality affected by recent stressors, including a breakup. - Increase trazodone  dosage to 100 mg at night if needed for better sleep.  Depression Managed with duloxetine  (Cymbalta ). No significant side effects reported. Recent stressors include a breakup, but duloxetine  is perceived as beneficial. - Continue duloxetine  and  monitor for further improvement.  Onychomycosis (toenail fungus) Persistent toenail fungus with no improvement from current topical treatment. - Continue current topical treatment for toenail fungus.  Gastroesophageal reflux disease (GERD) GERD symptoms well-controlled with current medication regimen. - Continue current GERD management regimen.        Return in about 6 months (around 11/10/2024) for f/u CPE.     Aryani Daffern  Fatime Biswell, MSN, APRN, FNP-C [facility]

## 2024-05-22 ENCOUNTER — Ambulatory Visit: Payer: Medicare HMO | Admitting: Dermatology

## 2024-06-04 DIAGNOSIS — G4733 Obstructive sleep apnea (adult) (pediatric): Secondary | ICD-10-CM | POA: Diagnosis not present

## 2024-07-11 NOTE — Progress Notes (Unsigned)
 "  Virtual Visit via Video Note:   Consent was obtained for video visit:  Yes.   Answered questions that patient had about telehealth interaction:  Yes.   I discussed the limitations, risks, security and privacy concerns of performing an evaluation and management service by telemedicine. I also discussed with the patient that there may be a patient responsible charge related to this service. The patient expressed understanding and agreed to proceed.  Pt location: Home Physician Location: office Name of referring provider:  Corwin Antu, FNP I connected with Sharon [name] at patients initiation/request on 07/14/2024 at  2:30 PM EST by video enabled telemedicine application and verified that I am speaking with the correct person using two identifiers. Pt MRN:  [id number] Pt DOB:  [birth year] Video Participants:  Sharon [name];  Assessment/Plan:   Essential tremor, stable   1 Primidone  50mg  in AM and 150mg  at bedtime 2.Follow up 1 year   Subjective:  Sharon [name] is a 59 year old female with generalized anxiety disorder, restless leg syndrome, hypertension, glaucoma, arthritis, chronic pain and history of PE and DVT in left leg who follows up for essential tremor   UPDATE: Medications:  Primidone  50mg  in AM and 150mg  at bedtime, amitriptyline  10mg  QHS (not taking it), gabapentin  600mg  BID and 600-800mg  QHS, trazodone  50mg  at bedtime PRN, nebivolol  5mg  daily   ***   HISTORY: She started having tremors around 2015.  It involves both hands.  It interferes with daily tasks.  She drops a lot.  She has occasional trouble with utensils or writing.  Nothing particular makes it worse.  Steadying her wrist on the arm rest will suppress it.     Her mother and grandmother had tremor.   She has chronic generalized pain as well as neck pain.  She reports a creepy crawly feeling under her skin, including back and down the legs.  She has been diagnosed with restless leg syndrome.  MRI of  cervical spine from 04/12/18 was personally reviewed and showed cervical spondylosis, degenerative disc disease and congenitally short pedicles, causing mild impingement at C3-4 on left, C6-7 on right and C7-T1 on left.  CT of right shoulder on 04/11/18 showed severe supraspinatus and infraspinatus tendinopathy without tear as well as small type 2 SLAP tear.  She notes a 'pinching pain radiating down the lateral arm and into the fingers with associated numbness and tingling in the fingers.  MRI lumbar spine from 04/13/18 showed L4-L5 right foraminal disc protrusion that may be causing right L4 nerve root irritation.  L5-S1 moderate face arthrosis and small central protrusion with annular fissure.  She underwent lab work on 03/04/2019 to evaluate chronic pain.  ANA was negative, RF negative, sed rate 5, CRP <0.1, CK 49.  B12 from 03/28/2022 was 553 and TSH was 1.80.  Hgb A1c from 07/11/2022 was 5.5.    She is dealing with increased stressors..  Reports short term memory problems.  For example, she always has to always use GPS everywhere.   Past medications:  Cymbalta  (worsened tremor)    Past Medical History: Past Medical History:  Diagnosis Date   Allergy     Anal fissure    Anemia    borderline   Anxiety    Arthritis    Blood clot in vein    left leg   Detached retina    BOTH SCLERA BUCKLE BOTH EYES   DVT (deep venous thrombosis) (HCC)    left leg   Endometriosis  Family history of adverse reaction to anesthesia    DAUGHTER POST OP PONV   GERD (gastroesophageal reflux disease)    Glaucoma    RIGHT WORST THAN LEFT   Heart murmur    caused by anxiety    History of hemorrhoids    History of kidney stones    Hypertension    IBS (irritable bowel syndrome)    Perforated eardrum, right    SMALL HOLE   PONV (postoperative nausea and vomiting)    Pulmonary embolism (HCC) 6 YRS AGO   Status post arthroscopy of left knee 08/06/2017    Medications: Outpatient Encounter Medications as  of 07/14/2024  Medication Sig Note   acyclovir  (ZOVIRAX ) 400 MG tablet Take 1 tablet by mouth daily. 12/04/2022: As needed   albuterol  (VENTOLIN  HFA) 108 (90 Base) MCG/ACT inhaler INHALE 1-2 PUFFS BY MOUTH EVERY 6 HOURS AS NEEDED FOR WHEEZE OR SHORTNESS OF BREATH    bimatoprost (LUMIGAN) 0.03 % ophthalmic solution Place 1 drop into both eyes at bedtime.    Cholecalciferol  (VITAMIN D ) 50 MCG (2000 UT) tablet 1 tablet    ciclopirox  (PENLAC ) 8 % solution Apply topically at bedtime. Apply over nail and surrounding skin. Apply daily over previous coat. After seven (7) days, may remove with alcohol and continue cycle.    clobetasol  (TEMOVATE ) 0.05 % external solution APPLY TO AFFECTED AREA TWICE A DAY    desonide  (DESOWEN ) 0.05 % cream Apply topically 2 (two) times daily.    DULoxetine  (CYMBALTA ) 20 MG capsule TAKE 1 CAPSULE BY MOUTH EVERY DAY    EPINEPHrine  0.3 mg/0.3 mL IJ SOAJ injection Inject 0.3 mg into the muscle as needed for anaphylaxis.    estradiol  (ESTRACE ) 0.1 MG/GM vaginal cream Insert pea-sized amount nightly for 2 weeks then every other night    famotidine  (PEPCID ) 40 MG tablet TAKE 1 TABLET BY MOUTH EVERY DAY    gabapentin  (NEURONTIN ) 100 MG capsule Take 2 capsules (200 mg total) by mouth 3 (three) times daily as needed.    gabapentin  (NEURONTIN ) 600 MG tablet Take 2 tablets (1,200 mg total) by mouth 3 (three) times daily.    lidocaine  (LIDODERM ) 5 % Apply 1 patch topically daily.    LUMIGAN 0.01 % SOLN 1 drop at bedtime.    methocarbamol  (ROBAXIN ) 500 MG tablet Take 1 tablet (500 mg total) by mouth every 8 (eight) hours as needed for muscle spasms.    mometasone  (NASONEX ) 50 MCG/ACT nasal spray Place 2 sprays into the nose daily.    nebivolol  (BYSTOLIC ) 5 MG tablet Take 1 tablet (5 mg total) by mouth daily.    nitroGLYCERIN  (NITROSTAT ) 0.4 MG SL tablet Place 1 tablet (0.4 mg total) under the tongue every 5 (five) minutes as needed for chest pain.    Nitroglycerin  (RECTIV ) 0.4 % OINT  Place 0.4 inches rectally 2 (two) times daily as needed (For anal fissures.).    pantoprazole  (PROTONIX ) 20 MG tablet TAKE 1 TABLET BY MOUTH EVERY DAY    pantoprazole  (PROTONIX ) 40 MG tablet TAKE 1 TABLET BY MOUTH DAILY AS NEEDED    polyethylene glycol powder (GLYCOLAX /MIRALAX ) 17 GM/SCOOP powder See admin instructions.    predniSONE  (STERAPRED UNI-PAK 21 TAB) 10 MG (21) TBPK tablet Take as directed    primidone  (MYSOLINE ) 50 MG tablet Take 1 tablet in morning and 3 tablets at bedtime.    traZODone  (DESYREL ) 50 MG tablet Take 1 tablet (50 mg total) by mouth at bedtime as needed for sleep.    triamcinolone  cream (KENALOG )  0.1 % Apply to affected areas hands twice daily until improved, then as needed for recurrence. Avoid applying to face, groin, and axilla. Use as directed. Long-term use can cause thinning of the skin.    vitamin B-12 (CYANOCOBALAMIN ) 1000 MCG tablet Take 1 tablet (1,000 mcg total) by mouth daily.    Zinc  50 MG TABS 1 tablet Orally Once a day for 30 day(s)    No facility-administered encounter medications on file as of 07/14/2024.    Allergies: Allergies[1]  Family History: Family History  Problem Relation Age of Onset   Emphysema Mother        smoker   Allergies Mother    Asthma Mother    Heart disease Mother        AMI as cause of death   COPD Mother    Asthma Father    Heart disease Father        AMI as cause of death   Diabetes Father    Asthma Brother    Heart disease Brother 78       heart failure   Lung cancer Maternal Grandfather        was a smoker   Cancer Maternal Grandfather        lung   Heart disease Paternal Grandmother        AMI   Heart disease Paternal Grandfather        AMI   Colon cancer Neg Hx     Observations/Objective:   No acute distress.  Alert and oriented.  Speech fluent and not dysarthric.  Language intact.  Eyes orthophoric on primary gaze.  Face symmetric.   Follow up Instructions:      -I discussed the assessment and  treatment plan with the patient. The patient was provided an opportunity to ask questions and all were answered. The patient agreed with the plan and demonstrated an understanding of the instructions.   The patient was advised to call back or seek an in-person evaluation if the symptoms worsen or if the condition fails to improve as anticipated.   Juliene Lamar Dunnings, DO     [1]  Allergies Allergen Reactions   Bactrim  [Sulfamethoxazole -Trimethoprim ] Shortness Of Breath   Bee Venom Anaphylaxis and Other (See Comments)   Penicillins Other (See Comments) and Swelling    Reaction unknown from childhood  Has patient had a PCN reaction causing immediate rash, facial/tongue/throat swelling, SOB or lightheadedness with hypotension: unknown  Has patient had a PCN reaction causing severe rash involving mucus membranes or skin necrosis: unknown   Has patient had a PCN reaction that required hospitalization: no  Has patient had a PCN reaction occurring within the last 10 years: no  If all of the above answers are NO, then may proceed with Cephalosporin use.  Other reaction(s): Other, Unknown  Throat swells  Other reaction(s): Not available  Other Reaction(s): Not available   Dextromethorphan     Other reaction(s): Lump in throat  Other Reaction(s): Not available  dextromethorphan   Elavil  [Amitriptyline ] Other (See Comments)    Dry mouth   Hydrocodone -Acetaminophen  Other (See Comments)   Oxycodone  Other (See Comments)   Penicillin G     Other reaction(s): Unknown   Dextromethorphan Polistirex Er Other (See Comments)    Pt states caused a lump in her throat, making it difficult to swallow   Fluconazole      Primidone  interaction Other reaction(s): Primidone  interaction Other reaction(s): Primidone  interaction   Hydrocodone  Nausea And Vomiting and Nausea Only  Other reaction(s): nausea and vomiting Other reaction(s): nausea and vomiting   Meloxicam  Other (See Comments)     raynauds   Oxycodone -Acetaminophen  Other (See Comments)    Made fingers tingle and go numb, started feeling weird.  Other reaction(s): Other Made fingers tingle and go numb, started feeling weird.  Other reaction(s): tingle/numb fingers Other reaction(s): tingle/numb fingers   Tape Itching and Other (See Comments)    Bandaids - leaves red marks  Other reaction(s): itching   "

## 2024-07-14 ENCOUNTER — Telehealth: Payer: Medicare HMO | Admitting: Neurology

## 2024-07-15 ENCOUNTER — Encounter: Admitting: Physical Medicine and Rehabilitation

## 2024-07-18 ENCOUNTER — Other Ambulatory Visit: Payer: Self-pay | Admitting: Physical Medicine and Rehabilitation

## 2024-07-30 ENCOUNTER — Telehealth: Admitting: Neurology

## 2024-08-05 ENCOUNTER — Encounter: Admitting: Physical Medicine and Rehabilitation

## 2024-08-19 ENCOUNTER — Encounter: Admitting: Family

## 2025-03-31 ENCOUNTER — Ambulatory Visit
